# Patient Record
Sex: Male | Born: 1935 | Race: White | Hispanic: No | Smoking: Former smoker
Health system: Southern US, Community
[De-identification: ages and names within clinical notes are randomized; demographics above are authoritative.]

## PROBLEM LIST (undated history)

## (undated) DIAGNOSIS — R918 Other nonspecific abnormal finding of lung field: Secondary | ICD-10-CM

## (undated) DIAGNOSIS — K219 Gastro-esophageal reflux disease without esophagitis: Secondary | ICD-10-CM

## (undated) DIAGNOSIS — C679 Malignant neoplasm of bladder, unspecified: Secondary | ICD-10-CM

## (undated) DIAGNOSIS — E785 Hyperlipidemia, unspecified: Secondary | ICD-10-CM

## (undated) DIAGNOSIS — I480 Paroxysmal atrial fibrillation: Secondary | ICD-10-CM

## (undated) DIAGNOSIS — G479 Sleep disorder, unspecified: Secondary | ICD-10-CM

## (undated) DIAGNOSIS — K2270 Barrett's esophagus without dysplasia: Secondary | ICD-10-CM

## (undated) DIAGNOSIS — I509 Heart failure, unspecified: Secondary | ICD-10-CM

## (undated) DIAGNOSIS — Z85828 Personal history of other malignant neoplasm of skin: Secondary | ICD-10-CM

## (undated) DIAGNOSIS — K635 Polyp of colon: Secondary | ICD-10-CM

## (undated) DIAGNOSIS — R531 Weakness: Secondary | ICD-10-CM

## (undated) DIAGNOSIS — R350 Frequency of micturition: Secondary | ICD-10-CM

## (undated) DIAGNOSIS — H3530 Unspecified macular degeneration: Secondary | ICD-10-CM

## (undated) DIAGNOSIS — C8590 Non-Hodgkin lymphoma, unspecified, unspecified site: Secondary | ICD-10-CM

## (undated) DIAGNOSIS — C169 Malignant neoplasm of stomach, unspecified: Secondary | ICD-10-CM

## (undated) DIAGNOSIS — I639 Cerebral infarction, unspecified: Secondary | ICD-10-CM

## (undated) DIAGNOSIS — K279 Peptic ulcer, site unspecified, unspecified as acute or chronic, without hemorrhage or perforation: Secondary | ICD-10-CM

## (undated) DIAGNOSIS — Z8551 Personal history of malignant neoplasm of bladder: Secondary | ICD-10-CM

## (undated) DIAGNOSIS — Z86010 Personal history of colonic polyps: Secondary | ICD-10-CM

## (undated) DIAGNOSIS — C61 Malignant neoplasm of prostate: Secondary | ICD-10-CM

## (undated) DIAGNOSIS — M1990 Unspecified osteoarthritis, unspecified site: Secondary | ICD-10-CM

## (undated) DIAGNOSIS — K449 Diaphragmatic hernia without obstruction or gangrene: Secondary | ICD-10-CM

## (undated) DIAGNOSIS — K5730 Diverticulosis of large intestine without perforation or abscess without bleeding: Secondary | ICD-10-CM

## (undated) HISTORY — PX: TRIGGER FINGER RELEASE: SHX641

## (undated) HISTORY — PX: TENDON REPAIR: SHX5111

## (undated) HISTORY — DX: Weakness: R53.1

## (undated) HISTORY — PX: CHOLECYSTECTOMY: SHX55

## (undated) HISTORY — DX: Malignant neoplasm of stomach, unspecified: C16.9

## (undated) HISTORY — DX: Malignant neoplasm of bladder, unspecified: C67.9

## (undated) HISTORY — DX: Personal history of colonic polyps: Z86.010

## (undated) HISTORY — DX: Hyperlipidemia, unspecified: E78.5

## (undated) HISTORY — DX: Other nonspecific abnormal finding of lung field: R91.8

## (undated) HISTORY — DX: Gastro-esophageal reflux disease without esophagitis: K21.9

## (undated) HISTORY — PX: WRIST SURGERY: SHX841

## (undated) HISTORY — DX: Polyp of colon: K63.5

## (undated) HISTORY — DX: Barrett's esophagus without dysplasia: K22.70

## (undated) HISTORY — DX: Peptic ulcer, site unspecified, unspecified as acute or chronic, without hemorrhage or perforation: K27.9

## (undated) HISTORY — DX: Malignant neoplasm of prostate: C61

## (undated) HISTORY — DX: Diaphragmatic hernia without obstruction or gangrene: K44.9

## (undated) HISTORY — PX: APPENDECTOMY: SHX54

## (undated) HISTORY — PX: OTHER SURGICAL HISTORY: SHX169

## (undated) HISTORY — PX: LASER OF PROSTATE W/ GREEN LIGHT PVP: SHX1953

## (undated) HISTORY — DX: Cerebral infarction, unspecified: I63.9

## (undated) HISTORY — DX: Diverticulosis of large intestine without perforation or abscess without bleeding: K57.30

---

## 1979-04-05 DIAGNOSIS — Z8551 Personal history of malignant neoplasm of bladder: Secondary | ICD-10-CM

## 1979-04-05 HISTORY — DX: Personal history of malignant neoplasm of bladder: Z85.51

## 1997-10-17 ENCOUNTER — Other Ambulatory Visit: Admission: RE | Admit: 1997-10-17 | Discharge: 1997-10-17 | Payer: Self-pay | Admitting: Gastroenterology

## 1998-01-17 ENCOUNTER — Emergency Department (HOSPITAL_COMMUNITY): Admission: EM | Admit: 1998-01-17 | Discharge: 1998-01-17 | Payer: Self-pay | Admitting: Emergency Medicine

## 1998-03-05 ENCOUNTER — Encounter: Payer: Self-pay | Admitting: Orthopedic Surgery

## 1998-03-12 ENCOUNTER — Ambulatory Visit (HOSPITAL_COMMUNITY): Admission: RE | Admit: 1998-03-12 | Discharge: 1998-03-12 | Payer: Self-pay | Admitting: Orthopedic Surgery

## 1998-06-12 ENCOUNTER — Other Ambulatory Visit: Admission: RE | Admit: 1998-06-12 | Discharge: 1998-06-12 | Payer: Self-pay | Admitting: Gastroenterology

## 1999-07-30 ENCOUNTER — Other Ambulatory Visit: Admission: RE | Admit: 1999-07-30 | Discharge: 1999-07-30 | Payer: Self-pay | Admitting: Gastroenterology

## 1999-07-30 ENCOUNTER — Encounter (INDEPENDENT_AMBULATORY_CARE_PROVIDER_SITE_OTHER): Payer: Self-pay | Admitting: Specialist

## 2000-07-20 ENCOUNTER — Other Ambulatory Visit: Admission: RE | Admit: 2000-07-20 | Discharge: 2000-07-20 | Payer: Self-pay | Admitting: Gastroenterology

## 2000-07-20 ENCOUNTER — Encounter (INDEPENDENT_AMBULATORY_CARE_PROVIDER_SITE_OTHER): Payer: Self-pay | Admitting: Specialist

## 2002-06-06 ENCOUNTER — Emergency Department (HOSPITAL_COMMUNITY): Admission: EM | Admit: 2002-06-06 | Discharge: 2002-06-06 | Payer: Self-pay | Admitting: Emergency Medicine

## 2003-04-07 ENCOUNTER — Encounter: Admission: RE | Admit: 2003-04-07 | Discharge: 2003-04-07 | Payer: Self-pay | Admitting: Orthopedic Surgery

## 2003-12-26 ENCOUNTER — Emergency Department (HOSPITAL_COMMUNITY): Admission: EM | Admit: 2003-12-26 | Discharge: 2003-12-26 | Payer: Self-pay | Admitting: Emergency Medicine

## 2003-12-27 ENCOUNTER — Emergency Department (HOSPITAL_COMMUNITY): Admission: EM | Admit: 2003-12-27 | Discharge: 2003-12-27 | Payer: Self-pay | Admitting: Emergency Medicine

## 2004-04-20 ENCOUNTER — Ambulatory Visit: Payer: Self-pay | Admitting: Gastroenterology

## 2004-05-04 ENCOUNTER — Ambulatory Visit: Payer: Self-pay | Admitting: Gastroenterology

## 2004-06-23 ENCOUNTER — Encounter: Admission: RE | Admit: 2004-06-23 | Discharge: 2004-07-22 | Payer: Self-pay | Admitting: Neurosurgery

## 2004-07-21 ENCOUNTER — Ambulatory Visit: Payer: Self-pay | Admitting: Gastroenterology

## 2004-10-11 ENCOUNTER — Ambulatory Visit: Payer: Self-pay | Admitting: Gastroenterology

## 2004-10-26 ENCOUNTER — Encounter (INDEPENDENT_AMBULATORY_CARE_PROVIDER_SITE_OTHER): Payer: Self-pay | Admitting: Specialist

## 2004-10-26 ENCOUNTER — Ambulatory Visit: Payer: Self-pay | Admitting: Gastroenterology

## 2005-08-03 ENCOUNTER — Encounter: Payer: Self-pay | Admitting: Emergency Medicine

## 2006-03-24 ENCOUNTER — Ambulatory Visit: Payer: Self-pay | Admitting: Gastroenterology

## 2006-04-05 ENCOUNTER — Encounter (INDEPENDENT_AMBULATORY_CARE_PROVIDER_SITE_OTHER): Payer: Self-pay | Admitting: *Deleted

## 2006-04-05 ENCOUNTER — Ambulatory Visit: Payer: Self-pay | Admitting: Gastroenterology

## 2007-02-20 ENCOUNTER — Ambulatory Visit: Payer: Self-pay | Admitting: Gastroenterology

## 2007-05-28 DIAGNOSIS — E785 Hyperlipidemia, unspecified: Secondary | ICD-10-CM

## 2007-05-28 DIAGNOSIS — K449 Diaphragmatic hernia without obstruction or gangrene: Secondary | ICD-10-CM | POA: Insufficient documentation

## 2007-05-28 DIAGNOSIS — C679 Malignant neoplasm of bladder, unspecified: Secondary | ICD-10-CM

## 2007-05-28 DIAGNOSIS — Z8719 Personal history of other diseases of the digestive system: Secondary | ICD-10-CM

## 2007-05-29 ENCOUNTER — Encounter (INDEPENDENT_AMBULATORY_CARE_PROVIDER_SITE_OTHER): Payer: Self-pay | Admitting: Gastroenterology

## 2008-03-10 ENCOUNTER — Telehealth: Payer: Self-pay | Admitting: Gastroenterology

## 2008-03-11 ENCOUNTER — Ambulatory Visit: Payer: Self-pay | Admitting: Gastroenterology

## 2008-04-09 ENCOUNTER — Ambulatory Visit: Payer: Self-pay | Admitting: Gastroenterology

## 2008-04-16 ENCOUNTER — Ambulatory Visit: Payer: Self-pay | Admitting: Gastroenterology

## 2008-04-16 ENCOUNTER — Encounter: Payer: Self-pay | Admitting: Gastroenterology

## 2008-04-16 LAB — CONVERTED CEMR LAB: UREASE: NEGATIVE

## 2008-04-17 ENCOUNTER — Encounter: Payer: Self-pay | Admitting: Gastroenterology

## 2008-06-19 ENCOUNTER — Ambulatory Visit (HOSPITAL_BASED_OUTPATIENT_CLINIC_OR_DEPARTMENT_OTHER): Admission: RE | Admit: 2008-06-19 | Discharge: 2008-06-19 | Payer: Self-pay | Admitting: Orthopedic Surgery

## 2008-11-27 ENCOUNTER — Ambulatory Visit (HOSPITAL_COMMUNITY): Admission: RE | Admit: 2008-11-27 | Discharge: 2008-11-27 | Payer: Self-pay | Admitting: Orthopedic Surgery

## 2009-03-26 ENCOUNTER — Ambulatory Visit (HOSPITAL_COMMUNITY): Admission: RE | Admit: 2009-03-26 | Discharge: 2009-03-26 | Payer: Self-pay | Admitting: Orthopedic Surgery

## 2009-04-04 DIAGNOSIS — C8590 Non-Hodgkin lymphoma, unspecified, unspecified site: Secondary | ICD-10-CM

## 2009-04-04 HISTORY — DX: Non-Hodgkin lymphoma, unspecified, unspecified site: C85.90

## 2009-04-30 ENCOUNTER — Ambulatory Visit: Payer: Self-pay | Admitting: Gastroenterology

## 2009-05-13 ENCOUNTER — Encounter: Admission: RE | Admit: 2009-05-13 | Discharge: 2009-06-10 | Payer: Self-pay | Admitting: Neurosurgery

## 2009-12-09 ENCOUNTER — Encounter (INDEPENDENT_AMBULATORY_CARE_PROVIDER_SITE_OTHER): Payer: Self-pay | Admitting: *Deleted

## 2009-12-15 ENCOUNTER — Encounter: Payer: Self-pay | Admitting: Gastroenterology

## 2009-12-16 ENCOUNTER — Telehealth: Payer: Self-pay | Admitting: Gastroenterology

## 2009-12-17 ENCOUNTER — Encounter: Payer: Self-pay | Admitting: Gastroenterology

## 2010-01-04 ENCOUNTER — Encounter (INDEPENDENT_AMBULATORY_CARE_PROVIDER_SITE_OTHER): Payer: Self-pay | Admitting: Internal Medicine

## 2010-01-04 ENCOUNTER — Encounter: Payer: Self-pay | Admitting: Oncology

## 2010-01-04 ENCOUNTER — Inpatient Hospital Stay (HOSPITAL_COMMUNITY): Admission: EM | Admit: 2010-01-04 | Discharge: 2010-01-12 | Payer: Self-pay | Admitting: Emergency Medicine

## 2010-01-05 ENCOUNTER — Encounter: Payer: Self-pay | Admitting: Gastroenterology

## 2010-01-05 ENCOUNTER — Ambulatory Visit: Payer: Self-pay | Admitting: Oncology

## 2010-01-06 ENCOUNTER — Encounter: Payer: Self-pay | Admitting: Oncology

## 2010-01-07 ENCOUNTER — Encounter: Payer: Self-pay | Admitting: Oncology

## 2010-01-09 ENCOUNTER — Ambulatory Visit: Payer: Self-pay | Admitting: Hematology & Oncology

## 2010-01-11 ENCOUNTER — Ambulatory Visit: Payer: Self-pay | Admitting: Oncology

## 2010-01-19 ENCOUNTER — Inpatient Hospital Stay (HOSPITAL_COMMUNITY): Admission: EM | Admit: 2010-01-19 | Discharge: 2010-01-22 | Payer: Self-pay | Admitting: Emergency Medicine

## 2010-01-25 ENCOUNTER — Encounter: Payer: Self-pay | Admitting: Gastroenterology

## 2010-01-25 LAB — CBC WITH DIFFERENTIAL/PLATELET
BASO%: 0.2 % (ref 0.0–2.0)
EOS%: 0 % (ref 0.0–7.0)
HCT: 31.4 % — ABNORMAL LOW (ref 38.4–49.9)
MCH: 30.9 pg (ref 27.2–33.4)
MCHC: 33.7 g/dL (ref 32.0–36.0)
MONO#: 0.3 10*3/uL (ref 0.1–0.9)
RBC: 3.43 10*6/uL — ABNORMAL LOW (ref 4.20–5.82)
RDW: 12.8 % (ref 11.0–14.6)
WBC: 9.2 10*3/uL (ref 4.0–10.3)
lymph#: 0.8 10*3/uL — ABNORMAL LOW (ref 0.9–3.3)

## 2010-01-25 LAB — COMPREHENSIVE METABOLIC PANEL
Albumin: 3.1 g/dL — ABNORMAL LOW (ref 3.5–5.2)
Alkaline Phosphatase: 25 U/L — ABNORMAL LOW (ref 39–117)
BUN: 15 mg/dL (ref 6–23)
CO2: 27 mEq/L (ref 19–32)
Chloride: 106 mEq/L (ref 96–112)
Potassium: 4 mEq/L (ref 3.5–5.3)
Sodium: 139 mEq/L (ref 135–145)
Total Bilirubin: 0.4 mg/dL (ref 0.3–1.2)

## 2010-01-25 LAB — URIC ACID: Uric Acid, Serum: 4.3 mg/dL (ref 4.0–7.8)

## 2010-02-10 ENCOUNTER — Ambulatory Visit: Payer: Self-pay | Admitting: Oncology

## 2010-02-12 ENCOUNTER — Encounter: Payer: Self-pay | Admitting: Gastroenterology

## 2010-02-12 LAB — CBC WITH DIFFERENTIAL/PLATELET
EOS%: 0.1 % (ref 0.0–7.0)
LYMPH%: 8.5 % — ABNORMAL LOW (ref 14.0–49.0)
MCH: 31.1 pg (ref 27.2–33.4)
MCHC: 33.2 g/dL (ref 32.0–36.0)
MCV: 93.5 fL (ref 79.3–98.0)
MONO%: 4 % (ref 0.0–14.0)
NEUT#: 9.2 10*3/uL — ABNORMAL HIGH (ref 1.5–6.5)
Platelets: 251 10*3/uL (ref 140–400)
RBC: 3.62 10*6/uL — ABNORMAL LOW (ref 4.20–5.82)
RDW: 16.1 % — ABNORMAL HIGH (ref 11.0–14.6)

## 2010-02-12 LAB — COMPREHENSIVE METABOLIC PANEL
AST: 15 U/L (ref 0–37)
Albumin: 3.9 g/dL (ref 3.5–5.2)
Alkaline Phosphatase: 32 U/L — ABNORMAL LOW (ref 39–117)
Glucose, Bld: 101 mg/dL — ABNORMAL HIGH (ref 70–99)
Potassium: 4.5 mEq/L (ref 3.5–5.3)
Sodium: 142 mEq/L (ref 135–145)
Total Bilirubin: 0.3 mg/dL (ref 0.3–1.2)
Total Protein: 6.4 g/dL (ref 6.0–8.3)

## 2010-02-12 LAB — URIC ACID: Uric Acid, Serum: 4.6 mg/dL (ref 4.0–7.8)

## 2010-02-19 LAB — CBC WITH DIFFERENTIAL/PLATELET
Basophils Absolute: 0.1 10*3/uL (ref 0.0–0.1)
HCT: 34.1 % — ABNORMAL LOW (ref 38.4–49.9)
HGB: 11.2 g/dL — ABNORMAL LOW (ref 13.0–17.1)
LYMPH%: 10.2 % — ABNORMAL LOW (ref 14.0–49.0)
MCH: 30.8 pg (ref 27.2–33.4)
MCHC: 32.8 g/dL (ref 32.0–36.0)
MONO#: 1.4 10*3/uL — ABNORMAL HIGH (ref 0.1–0.9)
NEUT%: 70.8 % (ref 39.0–75.0)
Platelets: 285 10*3/uL (ref 140–400)
WBC: 8.1 10*3/uL (ref 4.0–10.3)
lymph#: 0.8 10*3/uL — ABNORMAL LOW (ref 0.9–3.3)

## 2010-03-05 ENCOUNTER — Encounter: Payer: Self-pay | Admitting: Gastroenterology

## 2010-03-05 LAB — COMPREHENSIVE METABOLIC PANEL
ALT: 10 U/L (ref 0–53)
Alkaline Phosphatase: 32 U/L — ABNORMAL LOW (ref 39–117)
Glucose, Bld: 106 mg/dL — ABNORMAL HIGH (ref 70–99)
Sodium: 139 mEq/L (ref 135–145)
Total Bilirubin: 0.4 mg/dL (ref 0.3–1.2)
Total Protein: 6.2 g/dL (ref 6.0–8.3)

## 2010-03-05 LAB — CBC WITH DIFFERENTIAL/PLATELET
BASO%: 0.3 % (ref 0.0–2.0)
EOS%: 0.7 % (ref 0.0–7.0)
LYMPH%: 8.6 % — ABNORMAL LOW (ref 14.0–49.0)
MCH: 32.5 pg (ref 27.2–33.4)
MCHC: 34.6 g/dL (ref 32.0–36.0)
MCV: 93.9 fL (ref 79.3–98.0)
MONO%: 7.7 % (ref 0.0–14.0)
Platelets: 200 10*3/uL (ref 140–400)
RBC: 3.4 10*6/uL — ABNORMAL LOW (ref 4.20–5.82)

## 2010-03-09 ENCOUNTER — Ambulatory Visit (HOSPITAL_COMMUNITY)
Admission: RE | Admit: 2010-03-09 | Discharge: 2010-03-09 | Payer: Self-pay | Source: Home / Self Care | Attending: Oncology | Admitting: Oncology

## 2010-03-12 ENCOUNTER — Ambulatory Visit: Payer: Self-pay | Admitting: Oncology

## 2010-03-12 LAB — CBC WITH DIFFERENTIAL/PLATELET
BASO%: 0.7 % (ref 0.0–2.0)
HCT: 31.6 % — ABNORMAL LOW (ref 38.4–49.9)
LYMPH%: 9.9 % — ABNORMAL LOW (ref 14.0–49.0)
MCHC: 34.3 g/dL (ref 32.0–36.0)
MONO#: 1 10*3/uL — ABNORMAL HIGH (ref 0.1–0.9)
NEUT%: 71.8 % (ref 39.0–75.0)
Platelets: 261 10*3/uL (ref 140–400)
WBC: 6.5 10*3/uL (ref 4.0–10.3)

## 2010-03-12 LAB — COMPREHENSIVE METABOLIC PANEL
ALT: 9 U/L (ref 0–53)
BUN: 17 mg/dL (ref 6–23)
CO2: 27 mEq/L (ref 19–32)
Creatinine, Ser: 0.98 mg/dL (ref 0.40–1.50)
Glucose, Bld: 94 mg/dL (ref 70–99)
Total Bilirubin: 0.3 mg/dL (ref 0.3–1.2)

## 2010-03-12 LAB — LACTATE DEHYDROGENASE: LDH: 151 U/L (ref 94–250)

## 2010-03-26 ENCOUNTER — Encounter: Payer: Self-pay | Admitting: Gastroenterology

## 2010-03-26 ENCOUNTER — Ambulatory Visit (HOSPITAL_COMMUNITY)
Admission: RE | Admit: 2010-03-26 | Discharge: 2010-03-26 | Payer: Self-pay | Source: Home / Self Care | Attending: Oncology | Admitting: Oncology

## 2010-03-26 LAB — COMPREHENSIVE METABOLIC PANEL
ALT: 10 U/L (ref 0–53)
Albumin: 3.5 g/dL (ref 3.5–5.2)
BUN: 21 mg/dL (ref 6–23)
CO2: 26 mEq/L (ref 19–32)
Calcium: 8.6 mg/dL (ref 8.4–10.5)
Chloride: 106 mEq/L (ref 96–112)
Creatinine, Ser: 0.94 mg/dL (ref 0.40–1.50)
Potassium: 4.8 mEq/L (ref 3.5–5.3)

## 2010-03-26 LAB — CBC WITH DIFFERENTIAL/PLATELET
Basophils Absolute: 0 10*3/uL (ref 0.0–0.1)
HCT: 29.2 % — ABNORMAL LOW (ref 38.4–49.9)
HGB: 10.1 g/dL — ABNORMAL LOW (ref 13.0–17.1)
MONO#: 0.5 10*3/uL (ref 0.1–0.9)
NEUT#: 6.3 10*3/uL (ref 1.5–6.5)
NEUT%: 87.2 % — ABNORMAL HIGH (ref 39.0–75.0)
WBC: 7.2 10*3/uL (ref 4.0–10.3)
lymph#: 0.4 10*3/uL — ABNORMAL LOW (ref 0.9–3.3)

## 2010-03-26 LAB — LACTATE DEHYDROGENASE: LDH: 175 U/L (ref 94–250)

## 2010-04-01 ENCOUNTER — Encounter: Payer: Self-pay | Admitting: Gastroenterology

## 2010-04-01 LAB — COMPREHENSIVE METABOLIC PANEL
ALT: 19 U/L (ref 0–53)
AST: 28 U/L (ref 0–37)
Albumin: 3.2 g/dL — ABNORMAL LOW (ref 3.5–5.2)
Alkaline Phosphatase: 36 U/L — ABNORMAL LOW (ref 39–117)
BUN: 24 mg/dL — ABNORMAL HIGH (ref 6–23)
Calcium: 9.2 mg/dL (ref 8.4–10.5)
Chloride: 106 mEq/L (ref 96–112)
Potassium: 5 mEq/L (ref 3.5–5.3)
Sodium: 138 mEq/L (ref 135–145)
Total Protein: 6.4 g/dL (ref 6.0–8.3)

## 2010-04-01 LAB — CBC WITH DIFFERENTIAL/PLATELET
Basophils Absolute: 0.1 10*3/uL (ref 0.0–0.1)
EOS%: 1.1 % (ref 0.0–7.0)
HGB: 10.5 g/dL — ABNORMAL LOW (ref 13.0–17.1)
MCH: 32.7 pg (ref 27.2–33.4)
NEUT#: 5.9 10*3/uL (ref 1.5–6.5)
RBC: 3.21 10*6/uL — ABNORMAL LOW (ref 4.20–5.82)
RDW: 17.3 % — ABNORMAL HIGH (ref 11.0–14.6)
lymph#: 0.9 10*3/uL (ref 0.9–3.3)

## 2010-04-02 ENCOUNTER — Telehealth: Payer: Self-pay | Admitting: Gastroenterology

## 2010-04-10 ENCOUNTER — Emergency Department (HOSPITAL_COMMUNITY)
Admission: EM | Admit: 2010-04-10 | Discharge: 2010-04-10 | Payer: Self-pay | Source: Home / Self Care | Admitting: Emergency Medicine

## 2010-04-15 ENCOUNTER — Ambulatory Visit (HOSPITAL_BASED_OUTPATIENT_CLINIC_OR_DEPARTMENT_OTHER): Payer: BLUE CROSS/BLUE SHIELD | Admitting: Oncology

## 2010-04-16 ENCOUNTER — Encounter: Payer: Self-pay | Admitting: Gastroenterology

## 2010-04-16 LAB — CBC WITH DIFFERENTIAL/PLATELET
BASO%: 0.3 % (ref 0.0–2.0)
Basophils Absolute: 0 10*3/uL (ref 0.0–0.1)
EOS%: 0.5 % (ref 0.0–7.0)
Eosinophils Absolute: 0 10*3/uL (ref 0.0–0.5)
HCT: 29.8 % — ABNORMAL LOW (ref 38.4–49.9)
HGB: 10.2 g/dL — ABNORMAL LOW (ref 13.0–17.1)
LYMPH%: 7 % — ABNORMAL LOW (ref 14.0–49.0)
MCH: 32.6 pg (ref 27.2–33.4)
MCHC: 34.2 g/dL (ref 32.0–36.0)
MCV: 95.2 fL (ref 79.3–98.0)
MONO#: 0.4 10*3/uL (ref 0.1–0.9)
MONO%: 5.4 % (ref 0.0–14.0)
NEUT#: 7.2 10*3/uL — ABNORMAL HIGH (ref 1.5–6.5)
NEUT%: 86.8 % — ABNORMAL HIGH (ref 39.0–75.0)
Platelets: 186 10*3/uL (ref 140–400)
RBC: 3.13 10*6/uL — ABNORMAL LOW (ref 4.20–5.82)
RDW: 16.8 % — ABNORMAL HIGH (ref 11.0–14.6)
WBC: 8.3 10*3/uL (ref 4.0–10.3)
lymph#: 0.6 10*3/uL — ABNORMAL LOW (ref 0.9–3.3)

## 2010-04-16 LAB — COMPREHENSIVE METABOLIC PANEL
ALT: 12 U/L (ref 0–53)
AST: 19 U/L (ref 0–37)
Albumin: 3.8 g/dL (ref 3.5–5.2)
Alkaline Phosphatase: 35 U/L — ABNORMAL LOW (ref 39–117)
BUN: 22 mg/dL (ref 6–23)
CO2: 24 mEq/L (ref 19–32)
Calcium: 9 mg/dL (ref 8.4–10.5)
Chloride: 104 mEq/L (ref 96–112)
Creatinine, Ser: 0.97 mg/dL (ref 0.40–1.50)
Glucose, Bld: 92 mg/dL (ref 70–99)
Potassium: 4.5 mEq/L (ref 3.5–5.3)
Sodium: 137 mEq/L (ref 135–145)
Total Bilirubin: 0.4 mg/dL (ref 0.3–1.2)
Total Protein: 6.2 g/dL (ref 6.0–8.3)

## 2010-04-16 LAB — LACTATE DEHYDROGENASE: LDH: 170 U/L (ref 94–250)

## 2010-04-19 LAB — POCT I-STAT, CHEM 8
BUN: 24 mg/dL — ABNORMAL HIGH (ref 6–23)
Calcium, Ion: 1.09 mmol/L — ABNORMAL LOW (ref 1.12–1.32)
Chloride: 105 mEq/L (ref 96–112)
Creatinine, Ser: 1 mg/dL (ref 0.4–1.5)
Glucose, Bld: 98 mg/dL (ref 70–99)
HCT: 29 % — ABNORMAL LOW (ref 39.0–52.0)
Hemoglobin: 9.9 g/dL — ABNORMAL LOW (ref 13.0–17.0)
Potassium: 4.2 mEq/L (ref 3.5–5.1)
Sodium: 137 mEq/L (ref 135–145)
TCO2: 24 mmol/L (ref 0–100)

## 2010-04-22 ENCOUNTER — Other Ambulatory Visit: Payer: Self-pay | Admitting: Oncology

## 2010-04-22 DIAGNOSIS — C8590 Non-Hodgkin lymphoma, unspecified, unspecified site: Secondary | ICD-10-CM

## 2010-04-23 ENCOUNTER — Other Ambulatory Visit: Payer: Self-pay | Admitting: Oncology

## 2010-04-23 DIAGNOSIS — C8590 Non-Hodgkin lymphoma, unspecified, unspecified site: Secondary | ICD-10-CM

## 2010-04-23 LAB — CBC WITH DIFFERENTIAL/PLATELET
BASO%: 1.7 % (ref 0.0–2.0)
Basophils Absolute: 0.1 10*3/uL (ref 0.0–0.1)
EOS%: 1.4 % (ref 0.0–7.0)
Eosinophils Absolute: 0.1 10*3/uL (ref 0.0–0.5)
HCT: 32.4 % — ABNORMAL LOW (ref 38.4–49.9)
HGB: 10.6 g/dL — ABNORMAL LOW (ref 13.0–17.1)
LYMPH%: 17.2 % (ref 14.0–49.0)
MCH: 31.3 pg (ref 27.2–33.4)
MCHC: 32.7 g/dL (ref 32.0–36.0)
MCV: 95.6 fL (ref 79.3–98.0)
MONO#: 0.9 10*3/uL (ref 0.1–0.9)
MONO%: 10.7 % (ref 0.0–14.0)
NEUT#: 5.8 10*3/uL (ref 1.5–6.5)
NEUT%: 69 % (ref 39.0–75.0)
Platelets: 272 10*3/uL (ref 140–400)
RBC: 3.39 10*6/uL — ABNORMAL LOW (ref 4.20–5.82)
RDW: 15 % — ABNORMAL HIGH (ref 11.0–14.6)
WBC: 8.4 10*3/uL (ref 4.0–10.3)
lymph#: 1.4 10*3/uL (ref 0.9–3.3)

## 2010-04-23 LAB — COMPREHENSIVE METABOLIC PANEL
ALT: 10 U/L (ref 0–53)
AST: 17 U/L (ref 0–37)
Albumin: 3.8 g/dL (ref 3.5–5.2)
Alkaline Phosphatase: 33 U/L — ABNORMAL LOW (ref 39–117)
BUN: 22 mg/dL (ref 6–23)
CO2: 23 mEq/L (ref 19–32)
Calcium: 8.8 mg/dL (ref 8.4–10.5)
Chloride: 104 mEq/L (ref 96–112)
Creatinine, Ser: 0.86 mg/dL (ref 0.40–1.50)
Glucose, Bld: 94 mg/dL (ref 70–99)
Potassium: 4.6 mEq/L (ref 3.5–5.3)
Sodium: 140 mEq/L (ref 135–145)
Total Bilirubin: 0.2 mg/dL — ABNORMAL LOW (ref 0.3–1.2)
Total Protein: 5.9 g/dL — ABNORMAL LOW (ref 6.0–8.3)

## 2010-04-23 LAB — LACTATE DEHYDROGENASE: LDH: 157 U/L (ref 94–250)

## 2010-05-04 NOTE — Letter (Signed)
Summary: [facility]  [facility]   Imported By: Lester Ivins 03/03/2010 09:04:31  _____________________________________________________________________  External Attachment:    Type:   Image     Comment:   External Document

## 2010-05-04 NOTE — Letter (Signed)
Summary: Pre Visit Letter Revised  [facility]  [address] [place], [place] [address]   Phone: [phone]  Fax: [phone]        12/15/2009 MRN: [id number] [facility] [address] [place], [place]  [address]             Procedure Date:  Nov 7 at [facility] to the Gastroenterology Division at [facility].    You are scheduled to see a nurse for your pre-procedure visit on Jan 21, 2010 at 10am on the 3rd floor at [facility], [address]  We ask that you try to arrive at our office 15 minutes prior to your appointment time to allow for check-in.  Please take a minute to review the attached form.  If you answer "Yes" to one or more of the questions on the first page, we ask that you call the person listed at your earliest opportunity.  If you answer "No" to all of the questions, please complete the rest of the form and bring it to your appointment.    Your nurse visit will consist of discussing your medical and surgical history, your immediate family medical history, and your medications.   If you are unable to list all of your medications on the form, please bring the medication bottles to your appointment and we will list them.  We will need to be aware of both prescribed and over the counter drugs.  We will need to know exact dosage information as well.    Please be prepared to read and sign documents such as consent forms, a financial agreement, and acknowledgement forms.  If necessary, and with your consent, a friend or relative is welcome to sit-in on the nurse visit with you.  Please bring your insurance card so that we may make a copy of it.  If your insurance requires a referral to see a specialist, please bring your referral form from your primary care physician.  No co-pay is required for this nurse visit.     If you cannot keep your appointment, please call [phone] to cancel or reschedule prior to your appointment date.   This allows [place] the opportunity to schedule an appointment for another patient in need of care.    Thank you for choosing [facility] for your medical needs.  We appreciate the opportunity to care for you.  Please visit [place] at our website  to learn more about our practice.  Sincerely, The Gastroenterology Division

## 2010-05-04 NOTE — Letter (Signed)
Summary: [facility]  [facility]   Imported By: Sherian Rein 03/12/2010 07:23:52  _____________________________________________________________________  External Attachment:    Type:   Image     Comment:   External Document

## 2010-05-04 NOTE — Letter (Signed)
Summary: Endo/Colon Letter  [facility]  [address] [place], [place] [address]   Phone: [phone]  Fax: [phone]      December 09, 2009 MRN: [id number]   [facility] [address] [place], [place]  [address]   Dear Jason [name],   According to your medical record, it is time for you to schedule an Endoscopy/Colonoscopy . Endoscopic screeening is recommended for patients with certain upper digestive tract conditions because of associated increased risk for cancers of the upper digestive system. The American Cancer Society recommends Colonoscopy as a method to detect early colon cancer. Patients with a family history of colon cancer, or a personal history of colon polyps or inflammatory bowel disease are at increased risk.  This letter has been generated based on the recommendations made at the time of your prior procedure. If you feel that in your particular situation this may no longer apply, please contact our office.  Please call our office at [phone] to schedule this appointment or to update your records at your earliest convenience.  Thank you for cooperating with [place] to provide you with the very best care possible.   Sincerely,   Vania Rea. Jarold Motto, M.D.  [facility] [phone]

## 2010-05-04 NOTE — Progress Notes (Signed)
Summary: Triage  Phone Note Call from Patient Call back at Home Phone [phone]   Caller: Patient Call For: Dr. Jarold Motto Reason for Call: Talk to Nurse Summary of Call: Has a COL/EGD sch'd for 02-08-10 and having abd pain and discomfort. Been taking Nexium and it has relieved it some Initial call taken by: Karna Christmas,  December 16, 2009 4:07 PM  Follow-up for Phone Call         [place] and had reflux increase so doubled Nexium and had episode again yesterday so he tookan extra one and it helped.. Do you recommend staying on b.i.d? Follow-up by: Teryl Lucy RN,  December 17, 2009 8:33 AM  Additional Follow-up for Phone Call Additional follow up Details #1::        yes and p.r.n. liquid Carafate every 4-6 hours. Additional Follow-up by: Mardella Layman MD [facility],  December 17, 2009 8:39 AM     Appended Document: Triage Pt. ntfd. of Dr.Patterson's orders.

## 2010-05-04 NOTE — Miscellaneous (Signed)
Summary: Med. order  Clinical Lists Changes  Medications: Added new medication of CARAFATE 1 GM/10ML SUSP (SUCRALFATE) Use 10 ml every 4-6 hrs. as needed for abd. pain - Signed Rx of CARAFATE 1 GM/10ML SUSP (SUCRALFATE) Use 10 ml every 4-6 hrs. as needed for abd. pain;  #14 oz. x 1;  Signed;  Entered by: Teryl Lucy RN;  Authorized by: Mardella Layman MD [facility];  Method [facility]: [facility]  [phone]*, [address], [place], [place]  [address], Ph: [phone] or [phone], Fax: [phone]    Prescriptions: CARAFATE 1 GM/10ML SUSP (SUCRALFATE) Use 10 ml every 4-6 hrs. as needed for abd. pain  #14 oz. x 1   Entered by:   Teryl Lucy RN   Authorized by:   Mardella Layman MD [facility]   Signed by:   Teryl Lucy RN on 12/17/2009   Method used:   [facility] to        [facility]  [phone]* (retail)       [address] [place], [place]  [address]       Ph: [phone] or [phone]       Fax: [phone]   RxID:   [id number]

## 2010-05-04 NOTE — Assessment & Plan Note (Signed)
Summary: YEARLY F-UP & MED REFILLS/YF   History of Present Illness Visit Type: Follow-up Visit Primary GI MD: Sheryn Bison MD FACP FAGA Primary Provider: Gildardo Cranker, MD Chief Complaint: Nexium refills. Denies any GI sx.  History of Present Illness:   This patient is a 77 year old Caucasian male with chronic acid reflux and Barrett's mucosa. He is on daily Nexium and is currently asymptomatic. He is in excellent health without chronic medical problems except for hyperlipidemia and multiple urologic problems managed by Dr. Retta Diones. Last endoscopy was a year ago and showed no dysplasia. His chronic constipation diverticulosis and takes regular MiraLax at bedtime. Review of systems otherwise noncontributory today. He follows a regular diet has had no anorexia or weight loss. He is status post cholecystectomy and denies hepatobiliary complaints.   GI Review of Systems      Denies abdominal pain, acid reflux, belching, bloating, chest pain, dysphagia with liquids, dysphagia with solids, heartburn, loss of appetite, nausea, vomiting, vomiting blood, weight loss, and  weight gain.        Denies anal fissure, black tarry stools, change in bowel habit, constipation, diarrhea, diverticulosis, fecal incontinence, heme positive stool, hemorrhoids, irritable bowel syndrome, jaundice, light color stool, liver problems, rectal bleeding, and  rectal pain.    Current Medications (verified): 1)  Nexium 40 Mg Cpdr (Esomeprazole Magnesium) .... Take One By Mouth Once Daily 2)  Vytorin 10-40 Mg Tabs (Ezetimibe-Simvastatin) .... Take One By Mouth Once Daily 3)  Adult Aspirin Ec Low Strength 81 Mg Tbec (Aspirin) .... Take One By Mouth Once Daily 4)  Fish Oil 1000 Mg Caps (Omega-3 Fatty Acids) .... Take One By Mouth Three Times A Day 5)  Polyethylene Glycol 3350  Powd (Polyethylene Glycol 3350) .... Take One Scoop At Bedtime 6)  Icaps  Caps (Multiple Vitamins-Minerals) .... One Tablet By Mouth Two Times A  Day 7)  Fish Oil 1000 Mg Caps (Omega-3 Fatty Acids) .... One Tablet By Mouth Three Times A Day  Allergies (verified): No Known Drug Allergies  Past History:  Past medical, surgical, family and social histories (including risk factors) reviewed for relevance to current acute and chronic problems.  Past Medical History: Reviewed history from 03/10/2008 and no changes required. Current Problems:  BLADDER CANCER (ICD-188.9) NEOPLASM, MALIGNANT, PROSTATE, HX OF, S/P TURP (ICD-V10.46) CHOLECYSTECTOMY, LAPAROSCOPIC, HX OF (ICD-V45.79) PUD (ICD-533.90) HYPERLIPIDEMIA (ICD-272.4) HIATAL HERNIA (ICD-553.3) GERD (ICD-530.81) GASTRITIS, CHRONIC (ICD-535.10) DIVERTICULOSIS OF COLON (ICD-562.10) POLYP, COLON (ICD-211.3) BARRETT'S ESOPHAGUS, HX OF (ICD-V12.79)  Past Surgical History: cholecceptectomy,lt shoulder piece of bone removed,malignant tumor excise from bladder 81,turp 82, 83, 92. Right and Left wrist surgery Carpal Tunnel Release  Family History: Reviewed history from 03/11/2008 and no changes required. Back Cancer: Brother Bone Cancer: Brother Family History of Prostate Cancer:Father  Social History: Reviewed history from 03/11/2008 and no changes required. Occupation: Retired Patient is a former smoker. 1968 Alcohol Use - no Illicit Drug Use - no Patient does not get regular exercise.   Review of Systems  The patient denies allergy/sinus, anemia, anxiety-new, arthritis/joint pain, back pain, blood in urine, breast changes/lumps, change in vision, confusion, cough, coughing up blood, depression-new, fainting, fatigue, fever, headaches-new, hearing problems, heart murmur, heart rhythm changes, itching, menstrual pain, muscle pains/cramps, night sweats, nosebleeds, pregnancy symptoms, shortness of breath, skin rash, sleeping problems, sore throat, swelling of feet/legs, swollen lymph glands, thirst - excessive , urination - excessive , urination changes/pain, urine leakage,  vision changes, and voice change.    Vital Signs:  Patient profile:  77 year old male Height:      72 inches Weight:      248.38 pounds BMI:     33.81 Pulse rate:   66 / minute Pulse rhythm:   regular BP sitting:   148 / 74  (right arm) Cuff size:   regular  Vitals Entered By: Christie Nottingham CMA Duncan Dull) (April 30, 2009 10:27 AM)  Physical Exam  General:  Well developed, well nourished, no acute distress.healthy appearing.   Head:  Normocephalic and atraumatic. Eyes:  PERRLA, no icterus.exam deferred to patient's ophthalmologist.   Lungs:  Clear throughout to auscultation. Heart:  Regular rate and rhythm; no murmurs, rubs,  or bruits. Abdomen:  Soft, nontender and nondistended. No masses, hepatosplenomegaly or hernias noted. Normal bowel sounds.obese.   Extremities:  No clubbing, cyanosis, edema or deformities noted. Neurologic:  Alert and  oriented x4;  grossly normal neurologically. Cervical Nodes:  No significant cervical adenopathy. Psych:  Alert and cooperative. Normal mood and affect.   Impression & Recommendations:  Problem # 1:  GERD (ICD-530.81) Assessment Improved Continue reflux maneuvers and Nexium 40 mg q.a.m.  Problem # 2:  BARRETT'S ESOPHAGUS, HX OF (ICD-V12.79) Assessment: Improved Endoscopy followup next January as per clinical protocol  Problem # 3:  DIVERTICULOSIS OF COLON (ICD-562.10) Assessment: Improved High-Fiber Diet As Tolerated with liberal p.o. fluids and p.r.n. MiraLax.  Problem # 4:  POLYP, COLON (ICD-211.3) Assessment: Unchanged Review of his chart shows a history of multiple recurrent adenomas and he is due for followup colonoscopy next year at the time of his endoscopy.  Patient Instructions: 1)  Please continue current medications.  2)  Avoid foods high in acid content ( tomatoes, citrus juices, spicy foods) . Avoid eating within 3 to 4 hours of lying down or before exercising. Do not over eat; try smaller more frequent meals.  Elevate head of bed four inches when sleeping.  3)  Diet should be high in fiber ( fruits, vegetables, whole grains) but low in residue. Drink at least eight (8) glasses of water a day.  4)  Copy sent to : Dr. Okey Regal 5)  Endoscopy and colonoscopy followup in one year's time 6)  The medication list was reviewed and reconciled.  All changed / newly prescribed medications were explained.  A complete medication list was provided to the patient / caregiver.  Appended Document: YEARLY F-UP & MED REFILLS/YF    Clinical Lists Changes  Medications: Changed medication from NEXIUM 40 MG CPDR (ESOMEPRAZOLE MAGNESIUM) take one by mouth once daily to NEXIUM 40 MG CPDR (ESOMEPRAZOLE MAGNESIUM) take one by mouth once daily - Signed Rx of NEXIUM 40 MG CPDR (ESOMEPRAZOLE MAGNESIUM) take one by mouth once daily;  #30 x 11;  Signed;  Entered by: Ashok Cordia RN;  Authorized by: Mardella Layman MD [facility];  Method used: [facility] to [facility]  [phone]*, [address], [place], [place]  [address], Ph: [phone] or [phone], Fax: [phone]    Prescriptions: NEXIUM 40 MG CPDR (ESOMEPRAZOLE MAGNESIUM) take one by mouth once daily  #30 x 11   Entered by:   Ashok Cordia RN   Authorized by:   Mardella Layman MD [facility]   Signed by:   Ashok Cordia RN on 04/30/2009   Method used:   Electronically to        [facility]  [phone]* (retail)       [address] [place], [place]  [address]  Ph: [phone] or [phone]       Fax: [phone]   RxID:   [id number]

## 2010-05-04 NOTE — Letter (Signed)
Summary: [facility]  [facility]   Imported By: Sherian Rein 02/11/2010 08:47:20  _____________________________________________________________________  External Attachment:    Type:   Image     Comment:   External Document

## 2010-05-04 NOTE — Letter (Signed)
Summary: Consult/[facility]  Consult/[place]   Imported By: Sherian Rein 01/19/2010 09:07:49  _____________________________________________________________________  External Attachment:    Type:   Image     Comment:   External Document  Appended Document: ECL NOT NEEDED does not need endo/colon now.Marland KitchenMarland Kitchen

## 2010-05-06 NOTE — Progress Notes (Signed)
Summary: Samples  Phone Note Call from Patient Call back at Home Phone [phone]   Caller: Patient Call For: Dr. Jarold Motto Reason for Call: Talk to Nurse Summary of Call: Asking for samples of Nexium Initial call taken by: Karna Christmas,  April 02, 2010 8:21 AM  Follow-up for Phone Call        pt is now taking nexium once a day and I have given him samples he ran out when he was taking it two times a day  Follow-up by: Harlow Mares CMA (AAMA),  April 02, 2010 8:30 AM    New/Updated Medications: NEXIUM 40 MG CPDR (ESOMEPRAZOLE MAGNESIUM) take one by mouth once daily

## 2010-05-06 NOTE — Letter (Signed)
Summary: Lonna Cobb NP/[facility]  Lonna Cobb NP/[facility]   Imported By: Lester Toftrees 04/22/2010 08:44:17  _____________________________________________________________________  External Attachment:    Type:   Image     Comment:   External Document

## 2010-05-06 NOTE — Letter (Signed)
Summary: Lonna Cobb NP/[facility]  Lonna Cobb NP/[facility]   Imported By: Lester Lowry 04/20/2010 08:24:05  _____________________________________________________________________  External Attachment:    Type:   Image     Comment:   External Document

## 2010-05-07 ENCOUNTER — Encounter: Payer: BLUE CROSS/BLUE SHIELD | Admitting: Oncology

## 2010-05-07 DIAGNOSIS — Z5111 Encounter for antineoplastic chemotherapy: Secondary | ICD-10-CM

## 2010-05-07 DIAGNOSIS — C8583 Other specified types of non-Hodgkin lymphoma, intra-abdominal lymph nodes: Secondary | ICD-10-CM

## 2010-05-07 DIAGNOSIS — Z5112 Encounter for antineoplastic immunotherapy: Secondary | ICD-10-CM

## 2010-05-07 LAB — MORPHOLOGY: PLT EST: ADEQUATE

## 2010-05-07 LAB — CBC WITH DIFFERENTIAL/PLATELET
BASO%: 0.8 % (ref 0.0–2.0)
HCT: 33.7 % — ABNORMAL LOW (ref 38.4–49.9)
MCHC: 33.8 g/dL (ref 32.0–36.0)
MONO#: 0.4 10*3/uL (ref 0.1–0.9)
NEUT%: 81.7 % — ABNORMAL HIGH (ref 39.0–75.0)
WBC: 6.5 10*3/uL (ref 4.0–10.3)
lymph#: 0.7 10*3/uL — ABNORMAL LOW (ref 0.9–3.3)

## 2010-05-08 LAB — COMPREHENSIVE METABOLIC PANEL
ALT: 11 U/L (ref 0–53)
AST: 17 U/L (ref 0–37)
Alkaline Phosphatase: 36 U/L — ABNORMAL LOW (ref 39–117)
Potassium: 4.3 mEq/L (ref 3.5–5.3)
Sodium: 140 mEq/L (ref 135–145)
Total Bilirubin: 0.3 mg/dL (ref 0.3–1.2)
Total Protein: 6.2 g/dL (ref 6.0–8.3)

## 2010-05-12 NOTE — Letter (Signed)
Summary: [facility]  [facility]   Imported By: Sherian Rein 05/07/2010 11:06:43  _____________________________________________________________________  External Attachment:    Type:   Image     Comment:   External Document

## 2010-05-18 ENCOUNTER — Encounter (HOSPITAL_COMMUNITY)
Admission: RE | Admit: 2010-05-18 | Discharge: 2010-05-18 | Disposition: A | Discharge status: Home / Self Care | Payer: Medicare Other | Source: Ambulatory Visit | Attending: Oncology | Admitting: Oncology

## 2010-05-18 ENCOUNTER — Encounter (HOSPITAL_COMMUNITY): Payer: Self-pay

## 2010-05-18 ENCOUNTER — Other Ambulatory Visit (HOSPITAL_COMMUNITY): Payer: Self-pay

## 2010-05-18 ENCOUNTER — Other Ambulatory Visit: Payer: Self-pay | Admitting: Oncology

## 2010-05-18 ENCOUNTER — Ambulatory Visit (HOSPITAL_COMMUNITY)
Admission: RE | Admit: 2010-05-18 | Discharge: 2010-05-18 | Disposition: A | Discharge status: Home / Self Care | Payer: Medicare Other | Source: Ambulatory Visit | Attending: Oncology | Admitting: Oncology

## 2010-05-18 DIAGNOSIS — C8588 Other specified types of non-Hodgkin lymphoma, lymph nodes of multiple sites: Secondary | ICD-10-CM | POA: Insufficient documentation

## 2010-05-18 DIAGNOSIS — M47817 Spondylosis without myelopathy or radiculopathy, lumbosacral region: Secondary | ICD-10-CM | POA: Insufficient documentation

## 2010-05-18 DIAGNOSIS — R918 Other nonspecific abnormal finding of lung field: Secondary | ICD-10-CM | POA: Insufficient documentation

## 2010-05-18 DIAGNOSIS — C8589 Other specified types of non-Hodgkin lymphoma, extranodal and solid organ sites: Secondary | ICD-10-CM | POA: Insufficient documentation

## 2010-05-18 DIAGNOSIS — K7689 Other specified diseases of liver: Secondary | ICD-10-CM | POA: Insufficient documentation

## 2010-05-18 DIAGNOSIS — C8590 Non-Hodgkin lymphoma, unspecified, unspecified site: Secondary | ICD-10-CM

## 2010-05-18 MED ORDER — IOHEXOL 300 MG/ML  SOLN
125.0000 mL | Freq: Once | INTRAMUSCULAR | Status: AC | PRN
  Administered 2010-05-18: 125 mL via INTRAVENOUS

## 2010-05-18 MED ORDER — FLUDEOXYGLUCOSE F - 18 (FDG) INJECTION
16.7000 | Freq: Once | INTRAVENOUS | Status: AC | PRN
  Administered 2010-05-18: 16.7 via INTRAVENOUS

## 2010-05-21 ENCOUNTER — Encounter: Payer: Self-pay | Admitting: Gastroenterology

## 2010-05-21 ENCOUNTER — Encounter (HOSPITAL_BASED_OUTPATIENT_CLINIC_OR_DEPARTMENT_OTHER): Payer: BLUE CROSS/BLUE SHIELD | Admitting: Oncology

## 2010-05-21 ENCOUNTER — Other Ambulatory Visit: Payer: Self-pay | Admitting: Oncology

## 2010-05-21 DIAGNOSIS — C8583 Other specified types of non-Hodgkin lymphoma, intra-abdominal lymph nodes: Secondary | ICD-10-CM

## 2010-05-21 DIAGNOSIS — C772 Secondary and unspecified malignant neoplasm of intra-abdominal lymph nodes: Secondary | ICD-10-CM

## 2010-05-24 ENCOUNTER — Other Ambulatory Visit: Payer: Self-pay | Admitting: Oncology

## 2010-05-24 DIAGNOSIS — C8590 Non-Hodgkin lymphoma, unspecified, unspecified site: Secondary | ICD-10-CM

## 2010-06-04 ENCOUNTER — Ambulatory Visit (HOSPITAL_COMMUNITY)
Admission: RE | Admit: 2010-06-04 | Discharge: 2010-06-04 | Disposition: A | Discharge status: Home / Self Care | Payer: Medicare Other | Source: Ambulatory Visit | Attending: Oncology | Admitting: Oncology

## 2010-06-04 ENCOUNTER — Other Ambulatory Visit (HOSPITAL_COMMUNITY): Payer: Medicare Other

## 2010-06-04 DIAGNOSIS — C8589 Other specified types of non-Hodgkin lymphoma, extranodal and solid organ sites: Secondary | ICD-10-CM | POA: Insufficient documentation

## 2010-06-04 DIAGNOSIS — Z452 Encounter for adjustment and management of vascular access device: Secondary | ICD-10-CM | POA: Insufficient documentation

## 2010-06-04 DIAGNOSIS — K5900 Constipation, unspecified: Secondary | ICD-10-CM | POA: Insufficient documentation

## 2010-06-04 DIAGNOSIS — C8590 Non-Hodgkin lymphoma, unspecified, unspecified site: Secondary | ICD-10-CM

## 2010-06-04 LAB — CBC
HCT: 35.1 % — ABNORMAL LOW (ref 39.0–52.0)
Hemoglobin: 11.5 g/dL — ABNORMAL LOW (ref 13.0–17.0)
MCH: 31.1 pg (ref 26.0–34.0)
MCHC: 32.8 g/dL (ref 30.0–36.0)
RDW: 13.3 % (ref 11.5–15.5)

## 2010-06-15 NOTE — Letter (Signed)
Summary: Cephas Darby MD  Cephas Darby MD   Imported By: Lester Fannin 06/10/2010 09:06:04  _____________________________________________________________________  External Attachment:    Type:   Image     Comment:   External Document

## 2010-06-16 LAB — DIFFERENTIAL
Band Neutrophils: 0 % (ref 0–10)
Basophils Absolute: 0 10*3/uL (ref 0.0–0.1)
Eosinophils Absolute: 0 10*3/uL (ref 0.0–0.7)
Eosinophils Absolute: 0 10*3/uL (ref 0.0–0.7)
Lymphs Abs: 0 10*3/uL — ABNORMAL LOW (ref 0.7–4.0)
Monocytes Absolute: 0 10*3/uL — ABNORMAL LOW (ref 0.1–1.0)
Monocytes Absolute: 0 10*3/uL — ABNORMAL LOW (ref 0.1–1.0)
Monocytes Relative: 0 % — ABNORMAL LOW (ref 3–12)
Neutrophils Relative %: 87 % — ABNORMAL HIGH (ref 43–77)
nRBC: 0 /100 WBC

## 2010-06-16 LAB — CBC
HCT: 30.1 % — ABNORMAL LOW (ref 39.0–52.0)
MCH: 30.5 pg (ref 26.0–34.0)
MCH: 30.5 pg (ref 26.0–34.0)
MCH: 30.6 pg (ref 26.0–34.0)
MCHC: 33.6 g/dL (ref 30.0–36.0)
MCHC: 33.9 g/dL (ref 30.0–36.0)
MCHC: 34.1 g/dL (ref 30.0–36.0)
MCV: 88.4 fL (ref 78.0–100.0)
Platelets: 36 10*3/uL — ABNORMAL LOW (ref 150–400)
Platelets: 49 10*3/uL — ABNORMAL LOW (ref 150–400)
Platelets: 89 10*3/uL — ABNORMAL LOW (ref 150–400)
RBC: 3.25 MIL/uL — ABNORMAL LOW (ref 4.22–5.81)
RBC: 3.47 MIL/uL — ABNORMAL LOW (ref 4.22–5.81)
RDW: 12 % (ref 11.5–15.5)
RDW: 12.3 % (ref 11.5–15.5)

## 2010-06-16 LAB — BASIC METABOLIC PANEL
Chloride: 108 mEq/L (ref 96–112)
GFR calc Af Amer: >60 mL/min (ref >60)
Potassium: 3.7 mEq/L (ref 3.5–5.1)

## 2010-06-16 LAB — URINALYSIS, ROUTINE W REFLEX MICROSCOPIC
Glucose, UA: NEGATIVE mg/dL
Hgb urine dipstick: NEGATIVE
Ketones, ur: NEGATIVE mg/dL
pH: 7.5 (ref 5.0–8.0)

## 2010-06-16 LAB — COMPREHENSIVE METABOLIC PANEL
CO2: 23 mEq/L (ref 19–32)
Calcium: 7.8 mg/dL — ABNORMAL LOW (ref 8.4–10.5)
Creatinine, Ser: 0.91 mg/dL (ref 0.4–1.5)
GFR calc Af Amer: >60 mL/min (ref >60)
GFR calc non Af Amer: >60 mL/min (ref >60)
Glucose, Bld: 99 mg/dL (ref 70–99)

## 2010-06-16 LAB — CULTURE, BLOOD (ROUTINE X 2)
Culture  Setup Time: 201110180510
Culture: NO GROWTH

## 2010-06-16 LAB — URINE CULTURE
Colony Count: NO GROWTH
Culture  Setup Time: 201110180808

## 2010-06-17 LAB — COMPREHENSIVE METABOLIC PANEL
ALT: 16 U/L (ref 0–53)
ALT: 28 U/L (ref 0–53)
AST: 127 U/L — ABNORMAL HIGH (ref 0–37)
AST: 38 U/L — ABNORMAL HIGH (ref 0–37)
AST: 46 U/L — ABNORMAL HIGH (ref 0–37)
Albumin: 2.3 g/dL — ABNORMAL LOW (ref 3.5–5.2)
Albumin: 3.3 g/dL — ABNORMAL LOW (ref 3.5–5.2)
Albumin: 3.3 g/dL — ABNORMAL LOW (ref 3.5–5.2)
Alkaline Phosphatase: 20 U/L — ABNORMAL LOW (ref 39–117)
Alkaline Phosphatase: 30 U/L — ABNORMAL LOW (ref 39–117)
BUN: 19 mg/dL (ref 6–23)
CO2: 24 mEq/L (ref 19–32)
Calcium: 8.1 mg/dL — ABNORMAL LOW (ref 8.4–10.5)
Calcium: 8.5 mg/dL (ref 8.4–10.5)
Calcium: 9.2 mg/dL (ref 8.4–10.5)
Chloride: 99 mEq/L (ref 96–112)
Creatinine, Ser: 0.97 mg/dL (ref 0.4–1.5)
Creatinine, Ser: 1.06 mg/dL (ref 0.4–1.5)
GFR calc Af Amer: >60 mL/min (ref >60)
GFR calc Af Amer: >60 mL/min (ref >60)
GFR calc Af Amer: >60 mL/min (ref >60)
GFR calc non Af Amer: >60 mL/min (ref >60)
Glucose, Bld: 133 mg/dL — ABNORMAL HIGH (ref 70–99)
Glucose, Bld: 85 mg/dL (ref 70–99)
Potassium: 4.2 mEq/L (ref 3.5–5.1)
Potassium: 4.4 mEq/L (ref 3.5–5.1)
Sodium: 137 mEq/L (ref 135–145)
Sodium: 138 mEq/L (ref 135–145)
Total Bilirubin: 1 mg/dL (ref 0.3–1.2)
Total Protein: 5.1 g/dL — ABNORMAL LOW (ref 6.0–8.3)
Total Protein: 5.2 g/dL — ABNORMAL LOW (ref 6.0–8.3)
Total Protein: 5.7 g/dL — ABNORMAL LOW (ref 6.0–8.3)
Total Protein: 6.3 g/dL (ref 6.0–8.3)

## 2010-06-17 LAB — HIV-1 RNA QUANT-NO REFLEX-BLD: HIV-1 RNA Quant, Log: <1.3 {Log} (ref <1.30)

## 2010-06-17 LAB — TISSUE HYBRIDIZATION (BONE MARROW)-NCBH

## 2010-06-17 LAB — BODY FLUID CULTURE

## 2010-06-17 LAB — LACTATE DEHYDROGENASE: LDH: 230 U/L (ref 94–250)

## 2010-06-17 LAB — DIFFERENTIAL
Basophils Relative: 0 % (ref 0–1)
Eosinophils Absolute: 0 10*3/uL (ref 0.0–0.7)
Eosinophils Relative: 5 % (ref 0–5)
Lymphocytes Relative: 7 % — ABNORMAL LOW (ref 12–46)
Lymphs Abs: 0.6 10*3/uL — ABNORMAL LOW (ref 0.7–4.0)
Monocytes Absolute: 0.5 10*3/uL (ref 0.1–1.0)
Monocytes Absolute: 0.5 10*3/uL (ref 0.1–1.0)
Monocytes Relative: 6 % (ref 3–12)
Neutro Abs: 7.2 10*3/uL (ref 1.7–7.7)
Neutrophils Relative %: 92 % — ABNORMAL HIGH (ref 43–77)

## 2010-06-17 LAB — URINALYSIS, ROUTINE W REFLEX MICROSCOPIC
Bilirubin Urine: NEGATIVE
Glucose, UA: NEGATIVE mg/dL
Hgb urine dipstick: NEGATIVE
Specific Gravity, Urine: 1.022 (ref 1.005–1.030)
Urobilinogen, UA: 1 mg/dL (ref 0.0–1.0)
pH: 8 (ref 5.0–8.0)

## 2010-06-17 LAB — BODY FLUID CELL COUNT WITH DIFFERENTIAL
Lymphs, Fluid: 85 %
Neutrophil Count, Fluid: 1 % (ref 0–25)
Total Nucleated Cell Count, Fluid: 28565 cu mm — ABNORMAL HIGH (ref 0–1000)

## 2010-06-17 LAB — CBC
HCT: 30.4 % — ABNORMAL LOW (ref 39.0–52.0)
HCT: 34.3 % — ABNORMAL LOW (ref 39.0–52.0)
HCT: 36.2 % — ABNORMAL LOW (ref 39.0–52.0)
Hemoglobin: 10.5 g/dL — ABNORMAL LOW (ref 13.0–17.0)
Hemoglobin: 12.1 g/dL — ABNORMAL LOW (ref 13.0–17.0)
MCH: 30.9 pg (ref 26.0–34.0)
MCH: 31 pg (ref 26.0–34.0)
MCH: 31.4 pg (ref 26.0–34.0)
MCHC: 34.4 g/dL (ref 30.0–36.0)
MCHC: 34.6 g/dL (ref 30.0–36.0)
MCV: 91.4 fL (ref 78.0–100.0)
MCV: 91.7 fL (ref 78.0–100.0)
Platelets: 216 10*3/uL (ref 150–400)
Platelets: 246 10*3/uL (ref 150–400)
Platelets: 253 10*3/uL (ref 150–400)
Platelets: 268 10*3/uL (ref 150–400)
RBC: 3.47 MIL/uL — ABNORMAL LOW (ref 4.22–5.81)
RBC: 3.74 MIL/uL — ABNORMAL LOW (ref 4.22–5.81)
RBC: 3.96 MIL/uL — ABNORMAL LOW (ref 4.22–5.81)
RDW: 12 % (ref 11.5–15.5)
WBC: 7.9 10*3/uL (ref 4.0–10.5)
WBC: 9.3 10*3/uL (ref 4.0–10.5)

## 2010-06-17 LAB — CANCER ANTIGEN 19-9: CA 19-9: 17.4 U/mL — ABNORMAL LOW (ref <35.0)

## 2010-06-17 LAB — BASIC METABOLIC PANEL
CO2: 26 mEq/L (ref 19–32)
Calcium: 8.5 mg/dL (ref 8.4–10.5)
Creatinine, Ser: 1.08 mg/dL (ref 0.4–1.5)
GFR calc Af Amer: >60 mL/min (ref >60)
GFR calc non Af Amer: >60 mL/min (ref >60)

## 2010-06-17 LAB — PROTIME-INR
INR: 1.22 (ref 0.00–1.49)
INR: 1.28 (ref 0.00–1.49)
Prothrombin Time: 16.2 seconds — ABNORMAL HIGH (ref 11.6–15.2)

## 2010-06-17 LAB — PHOSPHORUS
Phosphorus: 4.7 mg/dL — ABNORMAL HIGH (ref 2.3–4.6)
Phosphorus: 4.8 mg/dL — ABNORMAL HIGH (ref 2.3–4.6)

## 2010-06-17 LAB — EBV AB TO VIRAL CAPSID AG PNL, IGG+IGM: EBV VCA IgG: 2.95 {ISR} — ABNORMAL HIGH

## 2010-06-17 LAB — LIPASE, BLOOD: Lipase: 32 U/L (ref 11–59)

## 2010-06-17 LAB — HEPATITIS PANEL, ACUTE: HCV Ab: NEGATIVE

## 2010-06-17 LAB — GLUCOSE, CAPILLARY: Glucose-Capillary: 92 mg/dL (ref 70–99)

## 2010-06-17 LAB — APTT: aPTT: 53 seconds — ABNORMAL HIGH (ref 24–37)

## 2010-06-29 ENCOUNTER — Ambulatory Visit (AMBULATORY_SURGERY_CENTER): Payer: BLUE CROSS/BLUE SHIELD

## 2010-06-29 VITALS — Ht 72.0 in | Wt 244.6 lb

## 2010-06-29 DIAGNOSIS — Z85028 Personal history of other malignant neoplasm of stomach: Secondary | ICD-10-CM

## 2010-06-29 DIAGNOSIS — Z139 Encounter for screening, unspecified: Secondary | ICD-10-CM

## 2010-06-29 MED ORDER — PEG-KCL-NACL-NASULF-NA ASC-C 100 G PO SOLR: 1.0000 | Freq: Once | ORAL | Status: AC

## 2010-07-02 ENCOUNTER — Telehealth: Payer: Self-pay | Admitting: *Deleted

## 2010-07-02 ENCOUNTER — Telehealth: Payer: Self-pay | Admitting: Gastroenterology

## 2010-07-02 NOTE — Telephone Encounter (Signed)
Reviewed new dates and times regarding prep instructions

## 2010-07-02 NOTE — Telephone Encounter (Signed)
Duplicate phone note.

## 2010-07-05 LAB — CBC
MCHC: 34 g/dL (ref 30.0–36.0)
MCV: 90.6 fL (ref 78.0–100.0)
Platelets: 215 10*3/uL (ref 150–400)
WBC: 6.8 10*3/uL (ref 4.0–10.5)

## 2010-07-10 LAB — BASIC METABOLIC PANEL
BUN: 24 mg/dL — ABNORMAL HIGH (ref 6–23)
Calcium: 8.8 mg/dL (ref 8.4–10.5)
Chloride: 110 mEq/L (ref 96–112)
Creatinine, Ser: 1 mg/dL (ref 0.4–1.5)
GFR calc Af Amer: >60 mL/min (ref >60)
GFR calc non Af Amer: >60 mL/min (ref >60)

## 2010-07-10 LAB — HEMOGLOBIN AND HEMATOCRIT, BLOOD
HCT: 39.2 % (ref 39.0–52.0)
Hemoglobin: 13.4 g/dL (ref 13.0–17.0)

## 2010-07-12 ENCOUNTER — Encounter: Payer: BLUE CROSS/BLUE SHIELD | Admitting: Gastroenterology

## 2010-07-14 ENCOUNTER — Ambulatory Visit (AMBULATORY_SURGERY_CENTER): Payer: Medicare Other | Admitting: Gastroenterology

## 2010-07-14 ENCOUNTER — Encounter: Payer: Self-pay | Admitting: Gastroenterology

## 2010-07-14 DIAGNOSIS — K219 Gastro-esophageal reflux disease without esophagitis: Secondary | ICD-10-CM

## 2010-07-14 DIAGNOSIS — K2960 Other gastritis without bleeding: Secondary | ICD-10-CM

## 2010-07-14 DIAGNOSIS — Z1211 Encounter for screening for malignant neoplasm of colon: Secondary | ICD-10-CM

## 2010-07-14 DIAGNOSIS — K2270 Barrett's esophagus without dysplasia: Secondary | ICD-10-CM

## 2010-07-14 DIAGNOSIS — D126 Benign neoplasm of colon, unspecified: Secondary | ICD-10-CM

## 2010-07-14 DIAGNOSIS — C8590 Non-Hodgkin lymphoma, unspecified, unspecified site: Secondary | ICD-10-CM

## 2010-07-14 DIAGNOSIS — K5730 Diverticulosis of large intestine without perforation or abscess without bleeding: Secondary | ICD-10-CM

## 2010-07-14 DIAGNOSIS — Z86010 Personal history of colonic polyps: Secondary | ICD-10-CM

## 2010-07-14 DIAGNOSIS — K2970 Gastritis, unspecified, without bleeding: Secondary | ICD-10-CM

## 2010-07-14 DIAGNOSIS — K2990 Gastroduodenitis, unspecified, without bleeding: Secondary | ICD-10-CM

## 2010-07-14 MED ORDER — SODIUM CHLORIDE 0.9 % IV SOLN: 500.0000 mL | INTRAVENOUS | Status: DC

## 2010-07-14 NOTE — Patient Instructions (Addendum)
[employer] and [employer] reviewed with patient and care partner.  [facility] Discharge Instruction sheet signed by care partner.  Impressions/Recommendations:  Diverticulosis (information sheet given) Polyp (information sheet given) Barrett's Esophagus (information sheet given) Gastritis (information sheet given)  Continue Nexium as directed.  Await pathology to determine further procedures.

## 2010-07-15 ENCOUNTER — Telehealth: Payer: Self-pay

## 2010-07-15 DIAGNOSIS — Z860100 Personal history of colon polyps, unspecified: Secondary | ICD-10-CM

## 2010-07-15 HISTORY — DX: Personal history of colonic polyps: Z86.010

## 2010-07-15 HISTORY — DX: Personal history of colon polyps, unspecified: Z86.0100

## 2010-07-15 NOTE — Telephone Encounter (Signed)

## 2010-07-19 DIAGNOSIS — K219 Gastro-esophageal reflux disease without esophagitis: Secondary | ICD-10-CM

## 2010-07-20 ENCOUNTER — Other Ambulatory Visit: Payer: Self-pay | Admitting: Physician Assistant

## 2010-07-21 ENCOUNTER — Encounter: Payer: Self-pay | Admitting: Gastroenterology

## 2010-08-17 NOTE — Assessment & Plan Note (Signed)
[facility]                         GASTROENTEROLOGY OFFICE NOTE   MADDEN, Jason                       MRN:          [id number]  DATE:02/20/2007                            DOB:          [birth year]    Mr. [name] is an elderly male who has Barrett's mucosa with frequent  endoscopy and followup by Dr. Corinda Gubler. He had endoscopy in 2006 and  January of 2008, both of which showed no evidence of dysplasia. He is  completely asymptomatic on daily Nexium. He denies lower GI or other  medical problems at this time. He does have chronic constipation for  which he takes MiraLax at bedtime and a daily aspirin tablet.   He is a healthy-appearing white male in no distress appearing his stated  age. He weighs 238 pounds and blood pressure is 118/70. Pulse 64 and  regular.  I could not appreciate stigmata of chronic liver disease.  CHEST: Was clear.  He is in a regular rhythm without significant murmurs, gallops or rubs.  I could not appreciate hepatosplenomegaly, abdominal masses or  tenderness. Bowel sounds were normal.   ASSESSMENT:  1. Chronic gastroesophageal reflux disease with known Barrett's mucosa      without evidence of dysplasia. I do not think he needs endoscopy      more than every three years.  2. History of recurrent colon polyps with followup examination due in      July of 2011.  3. Chronic functional constipation.   RECOMMENDATIONS:  1. Renew Nexium 40 mg 30 minutes before the first meal of the day      along with standard anti-reflux maneuvers.  2. Continue high fiber diet as tolerated with liberal p.o. fluids and      p.r.n. MiraLax.  3. Endoscopic-colonoscopy followup in 2-3 years.     Vania Rea. Jarold Motto, MD, Caleen Essex, FAGA  Electronically Signed    DRP/MedQ  DD: 02/20/2007  DT: 02/20/2007  Job #: [id number]

## 2010-08-17 NOTE — Op Note (Signed)
Jason [name], Jason [name]                ACCOUNT NO.:  [id number]   MEDICAL RECORD NO.:  [id number]          PATIENT TYPE:  AMB   LOCATION:  [facility]                         FACILITY[facility]   PHYSICIAN:  Marlowe Kays, M.D.  DATE OF BIRTH:  Dec 19, 1927   DATE OF PROCEDURE:  06/19/2008  DATE OF DISCHARGE:                               OPERATIVE REPORT   PREOPERATIVE DIAGNOSES:  1. Carpometacarpal arthritis, right thumb.  2. Chronic right little trigger finger, right hand.   POSTOPERATIVE DIAGNOSES:  1. Carpometacarpal arthritis, right thumb.  2. Chronic right little trigger finger, right hand.   OPERATION:  1. Carpometacarpal interposition arthroplasty, right thumb.  2. Release of A1 pulley and trigger finger, little finger right hand.   ASSISTANT:  Mr. Idolina Primer for #1.   ANESTHESIA:  General.   PATHOLOGY AND JUSTIFICATION FOR PROCEDURE:  He has had chronic pain and  tenderness at the carpometacarpal joint of his right thumb, with  arthritic changes on x-ray and has failed to respond to the usual  nonoperative options.  He also has no palmaris longus tendon.  Right  little finger likewise has failed to respond to nonsurgical options.  Accordingly, he is here today for the above-mentioned surgery.   PROCEDURE:  Prophylactic antibiotics, satisfactory general anesthesia,  pneumatic tourniquet applied and the right upper extremity esmarched out  nonsterilely.  The arm was prepped from midforearm to fingertips with  DuraPrep, draped in sterile field.  Time-out performed.  I first made a  transverse incision midway between the distal palmar crease and the  flexor crease of the little finger.  The surgical tech assisted me with  this part of the procedure.  Working in the midline, the neurovascular  bundles were retracted either side and the tendon sheath and the A1  pulley were identified.  I opened both with the 15 knife blade and  released the tendon sheath proximally and then released  and resected  portion of the pulley.  Distally, it was quite thick.  I was able to  flex and extend the little finger under direct visualization and there  was no evidence for any residual compression on the flexor tendons.  I  irrigated the wound well with sterile saline and closed the subcutaneous  tissue with 4-0 Vicryl and the skin with interrupted 4-0 nylon mattress  sutures.  Mr. Idolina Primer then joined me for the assistance in the  carpometacarpal portion of the procedure.  I made a incision between the  thenar eminence and the muscles and the dorsal extensor muscles of the  thumb, with a natural interval there being identified and developed.  Opened the capsule with a 15 knife blade, demarcating the distal greater  multangular and base of the proximal phalanx of the first metacarpal.  At the metaphyseal flare of the metacarpal, I then used a MicroSaw to  amputate the base and remove the fragment with sharp dissection. I then  similarly demarcated the distal portion of the greater multangular and  trying to preserve as much of it as possible, but resect the joint.  I  used a MicroSaw,  small osteotome, rongeur and curette to remove a  squared off portion of greater multangular as well.  Then under direct  visualization, I placed  two 0.045 smooth K-wires from proximal to  distal through the cut base of the metacarpal out the ulnar side of the  metacarpal and proximal phalanx.  I then was able to redirect these with  some in the desired position anatomically into the residual greater  multangular.  This seemed to give good stabilization.  I then released  the tourniquet. There was no unusual bleeding.  After irrigating the  wound well, I packed the resection gap with Gelfoam.  I then infiltrated  the pin sites and both wounds with 0.5% plain Marcaine.  The pins were  bent and cut and capped.  I closed the incision with interrupted 4-0  Vicryl, closing down the muscular interval between  the thenar muscles  and the dorsal extensor muscles, as well as the subcutaneous tissue.  Skin was closed with a running 4-0 tailor stitch Betadine, Adaptic, and  dry sterile dressing with a thumb spica splint cast was applied.  He  tolerated the procedure well and was taken to recovery room in  satisfactory condition with no known complications.           ______________________________  Marlowe Kays, M.D.     JA/MEDQ  D:  06/19/2008  T:  06/19/2008  Job:  [id number]

## 2010-08-17 NOTE — Op Note (Signed)
[name], [name]                ACCOUNT NO.:  [id number]   MEDICAL RECORD NO.:  [id number]          PATIENT TYPE:  AMB   LOCATION:  DAY                          FACILITY:  [facility]   PHYSICIAN:  Marlowe Kays, M.D.  DATE OF BIRTH:  05-Sep-1927   DATE OF PROCEDURE:  11/27/2008  DATE OF DISCHARGE:                               OPERATIVE REPORT   PREOPERATIVE DIAGNOSIS:  Painful carpometacarpal arthritis left thumb.   POSTOPERATIVE DIAGNOSIS:  Painful carpometacarpal arthritis left thumb.   OPERATION:  Interpositional arthroplasty carpometacarpal joint left  thumb.   SURGEON:  Marlowe Kays, M.D.   ASSISTANT:  Mr. Idolina Primer, [place].   ANESTHESIA:  General.   DESCRIPTION OF PROCEDURE:  He has had a similar problem and procedure on  the right hand and has done extremely well.  He has failed nonsurgical  treatment in his left hand and he is here today for a similar procedure.   PROCEDURE:  Prophylactic antibiotics, satisfactory general anesthesia,  pneumatic tourniquet left upper extremity with the arm Esmarched out non  sterilely and tourniquet inflated 225 mmHg.  The left arm from mid  forearm to fingertips was prepped with DuraPrep, draped in sterile  field.  He does not have a palmaris longus tendon and hence, I used  Gelfoam as the interpositional material.  I made an incision along the  radial side of the thenar eminence superiorly and working my way in  between the thenar muscle and the overlying tendon structures, went down  to the capsule of the carpometacarpal joint and with subperiosteal  dissection identified the bony anatomy of the greater multangular and  the base of the first metacarpal.  Protecting the overlying structures I  amputated the base of the first metacarpal at the mass at the of the  capsule flare with a micro saw.  I then again, carefully protecting the  greater multangular structures greater multangular, I used a micro saw  to begin the osteotomy of  the distal portion of it at the anatomic  flare.  I actually ended up taking out the distal portion piecemeal  fashion to minimize any risk to the surrounding structures.  Once both  sides of the joint had been resected,  I irrigated the wound well and  then placed 2 smooth, 0.45 K-wires retrograde coming one exiting out the  metacarpal and one out the proximal phalanx of the thumb and then  redirected them proximalward stabilizing the thumb and an ideal  functional position.  I then placed Gelfoam in the gap from the  resection.  The wound was then closed in layers with interrupted 3-0  Vicryl for the musculotendinous unit there at the incision site and in  subcutaneous tissue and interrupted 4-0 mattress sutures in the skin.  Tourniquet was released.  The hand pinked up nicely.  I also infiltrated  the thumb and surrounding tissues with  0.5 percent plain Marcaine.  The two pins were bent, cut and capped.  Betadine Adaptic dry sterile dressing and then a short-arm splint cast  was applied.  He tolerated the procedure well and was taken  to recovery  room in satisfactory condition with no known complications.           ______________________________  Marlowe Kays, M.D.     JA/MEDQ  D:  11/27/2008  T:  11/27/2008  Job:  [id number]

## 2010-09-17 ENCOUNTER — Ambulatory Visit (HOSPITAL_COMMUNITY)
Admission: RE | Admit: 2010-09-17 | Discharge: 2010-09-17 | Disposition: A | Discharge status: Home / Self Care | Payer: Medicare Other | Source: Ambulatory Visit | Attending: Oncology | Admitting: Oncology

## 2010-09-17 ENCOUNTER — Other Ambulatory Visit: Payer: Self-pay | Admitting: Oncology

## 2010-09-17 ENCOUNTER — Encounter (HOSPITAL_BASED_OUTPATIENT_CLINIC_OR_DEPARTMENT_OTHER): Payer: Medicare Other | Admitting: Oncology

## 2010-09-17 ENCOUNTER — Encounter (HOSPITAL_COMMUNITY): Payer: Self-pay

## 2010-09-17 DIAGNOSIS — C772 Secondary and unspecified malignant neoplasm of intra-abdominal lymph nodes: Secondary | ICD-10-CM

## 2010-09-17 DIAGNOSIS — C8589 Other specified types of non-Hodgkin lymphoma, extranodal and solid organ sites: Secondary | ICD-10-CM | POA: Insufficient documentation

## 2010-09-17 DIAGNOSIS — C8583 Other specified types of non-Hodgkin lymphoma, intra-abdominal lymph nodes: Secondary | ICD-10-CM

## 2010-09-17 DIAGNOSIS — M47814 Spondylosis without myelopathy or radiculopathy, thoracic region: Secondary | ICD-10-CM | POA: Insufficient documentation

## 2010-09-17 DIAGNOSIS — J984 Other disorders of lung: Secondary | ICD-10-CM | POA: Insufficient documentation

## 2010-09-17 LAB — CBC WITH DIFFERENTIAL/PLATELET
BASO%: 0.9 % (ref 0.0–2.0)
EOS%: 6.1 % (ref 0.0–7.0)
HCT: 40.6 % (ref 38.4–49.9)
LYMPH%: 16.8 % (ref 14.0–49.0)
MCH: 30.6 pg (ref 27.2–33.4)
MCHC: 34.3 g/dL (ref 32.0–36.0)
MCV: 89.2 fL (ref 79.3–98.0)
MONO#: 0.7 10*3/uL (ref 0.1–0.9)
MONO%: 10.9 % (ref 0.0–14.0)
NEUT%: 65.3 % (ref 39.0–75.0)
Platelets: 191 10*3/uL (ref 140–400)
RBC: 4.55 10*6/uL (ref 4.20–5.82)

## 2010-09-17 LAB — CMP (CANCER CENTER ONLY)
ALT(SGPT): 20 U/L (ref 10–47)
Alkaline Phosphatase: 51 U/L (ref 26–84)
CO2: 27 mEq/L (ref 18–33)
Potassium: 5 mEq/L — ABNORMAL HIGH (ref 3.3–4.7)
Sodium: 144 mEq/L (ref 128–145)
Total Bilirubin: 0.7 mg/dl (ref 0.20–1.60)
Total Protein: 7.1 g/dL (ref 6.4–8.1)

## 2010-09-17 LAB — MORPHOLOGY: PLT EST: ADEQUATE

## 2010-09-17 LAB — LACTATE DEHYDROGENASE: LDH: 169 U/L (ref 94–250)

## 2010-09-17 MED ORDER — IOHEXOL 300 MG/ML  SOLN
125.0000 mL | Freq: Once | INTRAMUSCULAR | Status: AC | PRN
  Administered 2010-09-17: 125 mL via INTRAVENOUS

## 2010-09-20 ENCOUNTER — Encounter: Payer: Medicare Other | Admitting: Oncology

## 2010-09-20 ENCOUNTER — Other Ambulatory Visit: Payer: Self-pay | Admitting: Oncology

## 2010-09-20 LAB — BASIC METABOLIC PANEL
BUN: 28 mg/dL — ABNORMAL HIGH (ref 6–23)
CO2: 24 mEq/L (ref 19–32)
Calcium: 9.4 mg/dL (ref 8.4–10.5)
Creatinine, Ser: 0.96 mg/dL (ref 0.50–1.35)

## 2010-09-24 ENCOUNTER — Other Ambulatory Visit: Payer: Self-pay | Admitting: Oncology

## 2010-09-24 ENCOUNTER — Encounter (HOSPITAL_BASED_OUTPATIENT_CLINIC_OR_DEPARTMENT_OTHER): Payer: Medicare Other | Admitting: Oncology

## 2010-09-24 DIAGNOSIS — C8583 Other specified types of non-Hodgkin lymphoma, intra-abdominal lymph nodes: Secondary | ICD-10-CM

## 2010-09-24 DIAGNOSIS — C8590 Non-Hodgkin lymphoma, unspecified, unspecified site: Secondary | ICD-10-CM

## 2010-11-03 ENCOUNTER — Ambulatory Visit (HOSPITAL_COMMUNITY)
Admission: RE | Admit: 2010-11-03 | Discharge: 2010-11-03 | Disposition: A | Discharge status: Home / Self Care | Payer: Medicare Other | Source: Ambulatory Visit | Attending: Oncology | Admitting: Oncology

## 2010-11-03 DIAGNOSIS — Z09 Encounter for follow-up examination after completed treatment for conditions other than malignant neoplasm: Secondary | ICD-10-CM | POA: Insufficient documentation

## 2010-11-03 DIAGNOSIS — C8589 Other specified types of non-Hodgkin lymphoma, extranodal and solid organ sites: Secondary | ICD-10-CM | POA: Insufficient documentation

## 2011-01-07 ENCOUNTER — Other Ambulatory Visit: Payer: Self-pay | Admitting: Oncology

## 2011-01-07 ENCOUNTER — Ambulatory Visit (HOSPITAL_COMMUNITY)
Admission: RE | Admit: 2011-01-07 | Discharge: 2011-01-07 | Disposition: A | Discharge status: Home / Self Care | Payer: Medicare Other | Source: Ambulatory Visit | Attending: Oncology | Admitting: Oncology

## 2011-01-07 ENCOUNTER — Encounter (HOSPITAL_BASED_OUTPATIENT_CLINIC_OR_DEPARTMENT_OTHER): Payer: Medicare Other | Admitting: Oncology

## 2011-01-07 DIAGNOSIS — C8589 Other specified types of non-Hodgkin lymphoma, extranodal and solid organ sites: Secondary | ICD-10-CM | POA: Insufficient documentation

## 2011-01-07 DIAGNOSIS — C8583 Other specified types of non-Hodgkin lymphoma, intra-abdominal lymph nodes: Secondary | ICD-10-CM

## 2011-01-07 DIAGNOSIS — J984 Other disorders of lung: Secondary | ICD-10-CM | POA: Insufficient documentation

## 2011-01-07 DIAGNOSIS — I2510 Atherosclerotic heart disease of native coronary artery without angina pectoris: Secondary | ICD-10-CM | POA: Insufficient documentation

## 2011-01-07 DIAGNOSIS — C8590 Non-Hodgkin lymphoma, unspecified, unspecified site: Secondary | ICD-10-CM

## 2011-01-07 DIAGNOSIS — Z9089 Acquired absence of other organs: Secondary | ICD-10-CM | POA: Insufficient documentation

## 2011-01-07 LAB — CBC WITH DIFFERENTIAL/PLATELET
BASO%: 0.3 % (ref 0.0–2.0)
Basophils Absolute: 0 10*3/uL (ref 0.0–0.1)
EOS%: 6.1 % (ref 0.0–7.0)
HGB: 14.2 g/dL (ref 13.0–17.1)
MCH: 31 pg (ref 27.2–33.4)
MCHC: 33.9 g/dL (ref 32.0–36.0)
MONO%: 8.5 % (ref 0.0–14.0)
RBC: 4.58 10*6/uL (ref 4.20–5.82)
RDW: 12.6 % (ref 11.0–14.6)
lymph#: 1 10*3/uL (ref 0.9–3.3)

## 2011-01-07 LAB — CMP (CANCER CENTER ONLY)
BUN, Bld: 22 mg/dL (ref 7–22)
CO2: 26 mEq/L (ref 18–33)
Creat: 1.1 mg/dl (ref 0.6–1.2)
Glucose, Bld: 97 mg/dL (ref 73–118)
Sodium: 144 mEq/L (ref 128–145)
Total Bilirubin: 1.2 mg/dl (ref 0.20–1.60)

## 2011-01-07 LAB — LACTATE DEHYDROGENASE: LDH: 196 U/L (ref 94–250)

## 2011-01-07 MED ORDER — IOHEXOL 300 MG/ML  SOLN
100.0000 mL | Freq: Once | INTRAMUSCULAR | Status: AC | PRN
  Administered 2011-01-07: 100 mL via INTRAVENOUS

## 2011-01-14 ENCOUNTER — Other Ambulatory Visit: Payer: Self-pay | Admitting: Oncology

## 2011-01-14 ENCOUNTER — Encounter (HOSPITAL_BASED_OUTPATIENT_CLINIC_OR_DEPARTMENT_OTHER): Payer: Medicare Other | Admitting: Oncology

## 2011-01-14 DIAGNOSIS — C8590 Non-Hodgkin lymphoma, unspecified, unspecified site: Secondary | ICD-10-CM

## 2011-01-14 DIAGNOSIS — Z87898 Personal history of other specified conditions: Secondary | ICD-10-CM

## 2011-04-16 ENCOUNTER — Telehealth: Payer: Self-pay | Admitting: Oncology

## 2011-04-16 NOTE — Telephone Encounter (Signed)
Called pt, left message for 05/13/11 for lab and CT. Pt will see ML on 2/15, asked pt to be NPO 4 hrs prior to scan and to pick up contrast before CT.

## 2011-05-04 ENCOUNTER — Other Ambulatory Visit: Payer: Self-pay | Admitting: Gastroenterology

## 2011-05-13 ENCOUNTER — Other Ambulatory Visit (HOSPITAL_BASED_OUTPATIENT_CLINIC_OR_DEPARTMENT_OTHER): Payer: Medicare Other | Admitting: Lab

## 2011-05-13 ENCOUNTER — Ambulatory Visit (HOSPITAL_COMMUNITY)
Admission: RE | Admit: 2011-05-13 | Discharge: 2011-05-13 | Disposition: A | Discharge status: Home / Self Care | Payer: Medicare Other | Source: Ambulatory Visit | Attending: Oncology | Admitting: Oncology

## 2011-05-13 ENCOUNTER — Encounter (HOSPITAL_COMMUNITY): Payer: Self-pay

## 2011-05-13 DIAGNOSIS — C8589 Other specified types of non-Hodgkin lymphoma, extranodal and solid organ sites: Secondary | ICD-10-CM | POA: Diagnosis not present

## 2011-05-13 DIAGNOSIS — Z9221 Personal history of antineoplastic chemotherapy: Secondary | ICD-10-CM | POA: Diagnosis not present

## 2011-05-13 DIAGNOSIS — I2510 Atherosclerotic heart disease of native coronary artery without angina pectoris: Secondary | ICD-10-CM | POA: Insufficient documentation

## 2011-05-13 DIAGNOSIS — C8583 Other specified types of non-Hodgkin lymphoma, intra-abdominal lymph nodes: Secondary | ICD-10-CM | POA: Diagnosis not present

## 2011-05-13 DIAGNOSIS — I517 Cardiomegaly: Secondary | ICD-10-CM | POA: Diagnosis not present

## 2011-05-13 DIAGNOSIS — R918 Other nonspecific abnormal finding of lung field: Secondary | ICD-10-CM | POA: Insufficient documentation

## 2011-05-13 DIAGNOSIS — I359 Nonrheumatic aortic valve disorder, unspecified: Secondary | ICD-10-CM | POA: Insufficient documentation

## 2011-05-13 DIAGNOSIS — C8590 Non-Hodgkin lymphoma, unspecified, unspecified site: Secondary | ICD-10-CM

## 2011-05-13 DIAGNOSIS — I700 Atherosclerosis of aorta: Secondary | ICD-10-CM | POA: Insufficient documentation

## 2011-05-13 DIAGNOSIS — Z9089 Acquired absence of other organs: Secondary | ICD-10-CM | POA: Insufficient documentation

## 2011-05-13 DIAGNOSIS — Z87898 Personal history of other specified conditions: Secondary | ICD-10-CM | POA: Diagnosis not present

## 2011-05-13 LAB — CBC WITH DIFFERENTIAL/PLATELET
BASO%: 0.7 % (ref 0.0–2.0)
Basophils Absolute: 0 10*3/uL (ref 0.0–0.1)
HCT: 41 % (ref 38.4–49.9)
HGB: 14 g/dL (ref 13.0–17.1)
MCHC: 34.2 g/dL (ref 32.0–36.0)
MONO#: 0.7 10*3/uL (ref 0.1–0.9)
NEUT#: 5 10*3/uL (ref 1.5–6.5)
NEUT%: 68.9 % (ref 39.0–75.0)
WBC: 7.3 10*3/uL (ref 4.0–10.3)
lymph#: 1.1 10*3/uL (ref 0.9–3.3)

## 2011-05-13 LAB — CMP (CANCER CENTER ONLY)
ALT(SGPT): 20 U/L (ref 10–47)
CO2: 29 mEq/L (ref 18–33)
Calcium: 8.9 mg/dL (ref 8.0–10.3)
Chloride: 104 mEq/L (ref 98–108)
Creat: 1.1 mg/dl (ref 0.6–1.2)

## 2011-05-13 MED ORDER — IOHEXOL 300 MG/ML  SOLN: 100.0000 mL | Freq: Once | INTRAMUSCULAR | Status: AC | PRN

## 2011-05-14 LAB — LACTATE DEHYDROGENASE: LDH: 165 U/L (ref 94–250)

## 2011-05-16 ENCOUNTER — Telehealth: Payer: Self-pay

## 2011-05-16 NOTE — Telephone Encounter (Signed)
Pt notified by phone per Dr Patsy Lager note - CXR, CT normal. dph

## 2011-05-16 NOTE — Telephone Encounter (Signed)
Message copied by Albertha Ghee on Mon May 16, 2011  5:08 PM ------      Message from: Levert Feinstein      Created: Sun May 15, 2011 11:01 AM       Call pt CXR, CT, lab all normal

## 2011-05-17 ENCOUNTER — Telehealth: Payer: Self-pay | Admitting: *Deleted

## 2011-05-17 NOTE — Telephone Encounter (Signed)
Pt notified that CXR, CT, & lab all normal per Dr. Cyndie Chime.

## 2011-05-17 NOTE — Telephone Encounter (Signed)
Message copied by Sabino Snipes on Tue May 17, 2011  5:16 PM ------      Message from: Levert Feinstein      Created: Sun May 15, 2011 11:01 AM       Call pt CXR, CT, lab all normal

## 2011-05-20 ENCOUNTER — Ambulatory Visit (HOSPITAL_BASED_OUTPATIENT_CLINIC_OR_DEPARTMENT_OTHER): Payer: Medicare Other | Admitting: Nurse Practitioner

## 2011-05-20 ENCOUNTER — Telehealth: Payer: Self-pay | Admitting: Oncology

## 2011-05-20 VITALS — BP 145/77 | HR 66 | Temp 97.0°F | Ht 72.0 in | Wt 246.7 lb

## 2011-05-20 DIAGNOSIS — C8590 Non-Hodgkin lymphoma, unspecified, unspecified site: Secondary | ICD-10-CM

## 2011-05-20 DIAGNOSIS — C8583 Other specified types of non-Hodgkin lymphoma, intra-abdominal lymph nodes: Secondary | ICD-10-CM | POA: Diagnosis not present

## 2011-05-20 NOTE — Telephone Encounter (Signed)
Gave pt, calenda appt for June 2013 and oral contrast for CT on 09/09/11

## 2011-05-20 NOTE — Progress Notes (Signed)
OFFICE PROGRESS NOTE  Interval history:  [name] is 78 year old man diagnosed with high-grade B-cell non-Hodgkin's lymphoma October 2011 presenting at that time with abdominal pain. He was found to have bulky retroperitoneal lymphadenopathy. He was treated with CHOP/Rituxan 01/08/2010 through 04/23/2010.   Restaging CT scans of the abdomen and pelvis 05/13/2011 showed no evidence of recurrent disease. There were multiple tiny nodules in the lung bases bilaterally which were predominately unchanged compared to the prior examination. One notable exception was a 6 mm nodule in the left lower lobe. Chest x-ray 05/15/2011 showed mild chronic interstitial change which was stable and stable cardiomegaly.   Objective: Blood pressure 145/77, pulse 66, temperature 97 F (36.1 C), temperature source Oral, height 6' (1.829 m), weight 246 lb 11.2 oz (111.902 kg).  Jason [name] denies fevers or sweats. He has a good appetite. He estimates that he has gained 11 pounds since his last visit here. No shortness of breath or chest pain. No cough. Bowels moving regularly. No urinary problems. His only complaint is pain in the right leg which he states is coming from his back. He has had a steroid injections in the past.  Lab Results: Lab Results  Component Value Date   WBC 7.3 05/13/2011   HGB 14.0 05/13/2011   HCT 41.0 05/13/2011   MCV 92.1 05/13/2011   PLT 189 05/13/2011    Chemistry:    Chemistry      Component Value Date/Time   NA 145 05/13/2011 1132   NA 136 09/20/2010 1147   K 4.7 05/13/2011 1132   K 4.5 09/20/2010 1147   CL 104 05/13/2011 1132   CL 104 09/20/2010 1147   CO2 29 05/13/2011 1132   CO2 24 09/20/2010 1147   BUN 23* 05/13/2011 1132   BUN 28* 09/20/2010 1147   CREATININE 1.1 05/13/2011 1132   CREATININE 0.96 09/20/2010 1147      Component Value Date/Time   CALCIUM 8.9 05/13/2011 1132   CALCIUM 9.4 09/20/2010 1147   ALKPHOS 35 05/13/2011 1132   ALKPHOS 36* 05/07/2010 1340   AST 25 05/13/2011 1132   AST 17  05/07/2010 1340   ALT 11 05/07/2010 1340   BILITOT 0.70 05/13/2011 1132   BILITOT 0.3 05/07/2010 1340       Studies/Results: Dg Chest 2 View  05/13/2011  *RADIOLOGY REPORT*  Clinical Data: History of lymphoma, former smoker  CHEST - 2 VIEW  Comparison: Chest x-ray of 03/26/2010 and CT chest of 01/07/2011  Findings: No active infiltrate or effusion is seen.  Mild chronic interstitial change is present and stable.  Cardiomegaly is stable. There are degenerative changes throughout the thoracic spine with  IMPRESSION: Stable chronic change in cardiomegaly.  No active lung disease.  Original Report Authenticated By: Juline Patch, M.D.   Ct Abdomen Pelvis W Contrast  05/13/2011  *RADIOLOGY REPORT*  Clinical Data: History of lymphoma of the stomach diagnosed in 2011 and status post chemotherapy, completed in January 2012.  CT ABDOMEN AND PELVIS WITH CONTRAST  Technique:  Multidetector CT imaging of the abdomen and pelvis was performed following the standard protocol during bolus administration of intravenous contrast.  Contrast:  100 ml of Omnipaque-300.  Comparison: CT of chest abdomen and pelvis dated 01/07/2011.  Findings:  Lung Bases: Multiple small pulmonary nodules are seen in the lung bases bilaterally, the largest of which measures up to 6 mm in diameter in the left lower lobe (image five of series three), which is new compared to the prior examination  from 01/07/2011.  5 mm nodule in the right lower lobe (image 8 of series 3) is unchanged compared to the prior examination.  Mild diffuse interstitial prominence and bronchial wall thickening is also noted, similar to prior examination. There is atherosclerosis of the thoracic aorta, the great vessels of the mediastinum and the coronary arteries, including calcified atherosclerotic plaque in the left circumflex coronary arteries. Aortic valve calcifications.  Abdomen/Pelvis:  Status post cholecystectomy.  The enhanced appearance of the liver, pancreas, spleen,  bilateral adrenal glands and the left kidney is unremarkable.  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  Remainder of the right kidney is otherwise unremarkable.  Amorphous infiltrative soft tissue is again noted in the small bowel mesentery, which is difficult to measure accurately because of its irregular shape and appearance.  However, this is very similar to prior study from 01/07/2011.  No definite pathologically enlarged lymph nodes are noted within the abdomen or pelvis.  No new soft tissue masses are identified.  There is some colonic wall thickening centered around the hepatic flexure of the colon, however, this may be accentuated by relative decompression of the colon throughout these regions.  No ascites or pneumoperitoneum and no pathologic distension of bowel.  Urinary bladder is unremarkable.  Musculoskeletal: There are no aggressive appearing lytic or blastic lesions noted in the visualized portions of the skeleton.  IMPRESSION: 1.  No findings in the abdomen or pelvis to suggest recurrent disease. 2. Multiple tiny nodules in the lung bases bilaterally, predominately unchanged compared to prior examination.  One notable exception is a 6 mm nodule in the left lower lobe. If the patient is at high risk for bronchogenic carcinoma, follow-up chest CT at 6- 12 months is recommended.  If the patient is at low risk for bronchogenic carcinoma, follow-up chest CT at 12 months is recommended.  This recommendation follows the consensus statement: Guidelines for Management of Small Pulmonary Nodules Detected on CT Scans: A Statement from the Fleischner Society as published in Radiology 2005; 237:395-400. Online at: DietDisorder.cz. 3. Apparent colonic wall thickening in the region of the hepatic flexure.  This finding is likely accentuated by relative decompression of bowel  in this region, however, correlation for signs of symptoms of colitis is recommended. 4.  Atherosclerosis, including  coronary artery disease. Please note that although the presence of coronary artery calcium documents the presence of coronary artery disease, the severity of this disease and any potential stenosis cannot be assessed on this non-gated CT examination.  Assessment for potential risk factor modification, dietary therapy or pharmacologic therapy may be warranted, if clinically indicated. 5 There are calcifications of the aortic valve.  Echocardiographic correlation for evaluation of potential valvular dysfunction may be warranted if clinically indicated. 6.  Status post cholecystectomy.  Original Report Authenticated By: Florencia Reasons, M.D.    Medications: I have reviewed the patient's current medications.  Assessment/Plan: 1. High-grade B-cell non-Hodgkin's lymphoma diagnosed October 2011 presenting with abdominal pain and subsequently found to have bulky retroperitoneal lymphadenopathy. He completed CHOP/Rituxan chemotherapy 01/08/2010 through 04/23/2010 achieving a complete response.  2. Small bilateral pulmonary nodules, chronic. We will continue to follow with routine CT scans for followup of the non-Hodgkin's lymphoma.   Disposition-Jason [name] appears stable. He remains in remission from the non-Hodgkin's lymphoma. We will repeat labs and CT scans at a 4 month interval. We will continue to follow the small  bilateral pulmonary nodules on CT as well. He will return for a followup visit with Dr. Cyndie Chime in June of this year. He will contact the office the interim with any problems.   Lonna Cobb ANP/GNP-BC

## 2011-05-24 ENCOUNTER — Other Ambulatory Visit: Payer: Self-pay | Admitting: Nurse Practitioner

## 2011-05-24 DIAGNOSIS — C8590 Non-Hodgkin lymphoma, unspecified, unspecified site: Secondary | ICD-10-CM

## 2011-05-25 ENCOUNTER — Telehealth: Payer: Self-pay | Admitting: Oncology

## 2011-05-25 NOTE — Telephone Encounter (Signed)
per 02/19 ordes cancelled cxr and added ct chest to ct scan appt on 06/07.  time did not change

## 2011-06-02 ENCOUNTER — Other Ambulatory Visit: Payer: Self-pay | Admitting: Gastroenterology

## 2011-06-08 ENCOUNTER — Other Ambulatory Visit: Payer: Self-pay | Admitting: Gastroenterology

## 2011-06-08 DIAGNOSIS — M79609 Pain in unspecified limb: Secondary | ICD-10-CM | POA: Diagnosis not present

## 2011-06-08 DIAGNOSIS — M47817 Spondylosis without myelopathy or radiculopathy, lumbosacral region: Secondary | ICD-10-CM | POA: Diagnosis not present

## 2011-06-08 DIAGNOSIS — M4310 Spondylolisthesis, site unspecified: Secondary | ICD-10-CM | POA: Diagnosis not present

## 2011-06-09 ENCOUNTER — Other Ambulatory Visit: Payer: Self-pay | Admitting: Gastroenterology

## 2011-06-09 NOTE — Telephone Encounter (Signed)
Informed pt we do not have any samples of Nexium; he can call back Monday and see if any have come in. Pt asked me to order some and I informed him he needs an OV; pt has an appt on 06/14/11.  Pt stated he will see [place] Tuesday, 06/14/11.

## 2011-06-13 ENCOUNTER — Encounter: Payer: Self-pay | Admitting: *Deleted

## 2011-06-14 ENCOUNTER — Encounter: Payer: Self-pay | Admitting: Gastroenterology

## 2011-06-14 ENCOUNTER — Ambulatory Visit (INDEPENDENT_AMBULATORY_CARE_PROVIDER_SITE_OTHER): Payer: Medicare Other | Admitting: Gastroenterology

## 2011-06-14 VITALS — BP 126/76 | HR 72 | Ht 72.0 in | Wt 248.0 lb

## 2011-06-14 DIAGNOSIS — K219 Gastro-esophageal reflux disease without esophagitis: Secondary | ICD-10-CM | POA: Diagnosis not present

## 2011-06-14 DIAGNOSIS — K5730 Diverticulosis of large intestine without perforation or abscess without bleeding: Secondary | ICD-10-CM

## 2011-06-14 DIAGNOSIS — K5909 Other constipation: Secondary | ICD-10-CM | POA: Insufficient documentation

## 2011-06-14 DIAGNOSIS — K5900 Constipation, unspecified: Secondary | ICD-10-CM

## 2011-06-14 MED ORDER — ESOMEPRAZOLE MAGNESIUM 40 MG PO CPDR: DELAYED_RELEASE_CAPSULE | ORAL | Status: DC

## 2011-06-14 NOTE — Progress Notes (Signed)
This is a very complex 78 year old Caucasian male was recently completed chemotherapy for non-Hodgkin's lymphoma involving the small bowel and mesentery. He has a long history of acid reflux, Barrett's mucosa, and every 2-3 years endoscopy with dysplasia screening over the last 30 years. He also has a history of recurrent colon polyps and had a negative colonoscopy except for small adenoma in April of 2012. Review of his pathology shows no evidence of dysplasia on his esophageal biopsies. He currently is completely asymptomatic on Nexium 40 mg a day. Denies dysphagia or hepatobiliary complaints. Prior problems of included diverticulosis and chronic constipation with associated gas and bloating. The patient follows a regular diet and denies any specific food intolerances, anorexia, weight loss, or current abdominal pain. He also denies melena, hematochezia, fever, chills, or other systemic complaints. He is under the care of Dr. Cephas Darby in oncology.  Current Medications, Allergies, Past Medical History, Past Surgical History, Family History and Social History were reviewed in [employer] record.  Pertinent Review of Systems Negative.... cardiovascular or pulmonary complaints. His been treated previously for prostate cancer, and denies genitourinary difficulties.   Physical Exam: Healthy-appearing patient in no acute distress. Blood pressure 126/76, pulse 72 and regular, and BMI of 33.63. Cannot appreciate stigmata of chronic liver disease. His chest is generally clear in the appearance to be in a regular rhythm without murmurs gallops or rubs. He has an obese abdomen , but no organomegaly, masses, tenderness, or abnormal bowel sounds. Mental status is clear peripheral extremities are unremarkable.    Assessment and Plan: His acid reflux is under good control with Nexium 40 mg a day which was renewed. I do not think he needs followup endoscopy his age and no pathologic  evidence of dysplasia over 30 years for surveillance. He also is up-to-date on his colonoscopy exams. For his constipation I placed him on a high fiber diet with liberal by mouth fluids, and daily Metamucil. Advised him to continue his other medications as listed and reviewed with GI followup as needed. No diagnosis found.

## 2011-06-14 NOTE — Patient Instructions (Addendum)
Your prescription(s) have been sent to you pharmacy.  Follow high fiber diet below and buy metamucil OTC and take it once a day.  Follow up in one year with Dr Alfredia Ferguson Fiber Diet A high fiber diet changes your normal diet to include more whole grains, legumes, fruits, and vegetables. Changes in the diet involve replacing refined carbohydrates with unrefined foods. The calorie level of the diet is essentially unchanged. The Dietary Reference Intake (recommended amount) for adult males is 38 g per day. For adult females, it is 25 g per day. Pregnant and lactating women should consume 28 g of fiber per day. Fiber is the intact part of a plant that is not broken down during digestion. Functional fiber is fiber that has been isolated from the plant to provide a beneficial effect in the body. PURPOSE  Increase stool bulk.   Ease and regulate bowel movements.   Lower cholesterol.  INDICATIONS THAT YOU NEED MORE FIBER  Constipation and hemorrhoids.   Uncomplicated diverticulosis (intestine condition) and irritable bowel syndrome.   Weight management.   As a protective measure against hardening of the arteries (atherosclerosis), diabetes, and cancer.  NOTE OF CAUTION If you have a digestive or bowel problem, ask your caregiver for advice before adding high fiber foods to your diet. Some of the following medical problems are such that a high fiber diet should not be used without consulting your caregiver:  Acute diverticulitis (intestine infection).   Partial small bowel obstructions.   Complicated diverticular disease involving bleeding, rupture (perforation), or abscess (boil, furuncle).   Presence of autonomic neuropathy (nerve damage) or gastric paresis (stomach cannot empty itself).  GUIDELINES FOR INCREASING FIBER  Start adding fiber to the diet slowly. A gradual increase of about 5 more grams (2 slices of whole-wheat bread, 2 servings of most fruits or vegetables, or 1 bowl of  high fiber cereal) per day is best. Too rapid an increase in fiber may result in constipation, flatulence, and bloating.   Drink enough water and fluids to keep your urine clear or pale yellow. Water, juice, or caffeine-free drinks are recommended. Not drinking enough fluid may cause constipation.   Eat a variety of high fiber foods rather than one type of fiber.   Try to increase your intake of fiber through using high fiber foods rather than fiber pills or supplements that contain small amounts of fiber.   The goal is to change the types of food eaten. Do not supplement your present diet with high fiber foods, but replace foods in your present diet.  INCLUDE A VARIETY OF FIBER SOURCES  Replace refined and processed grains with whole grains, canned fruits with fresh fruits, and incorporate other fiber sources. White rice, white breads, and most bakery goods contain little or no fiber.   Brown whole-grain rice, buckwheat oats, and many fruits and vegetables are all good sources of fiber. These include: broccoli, Brussels sprouts, cabbage, cauliflower, beets, sweet potatoes, white potatoes (skin on), carrots, tomatoes, eggplant, squash, berries, fresh fruits, and dried fruits.   Cereals appear to be the richest source of fiber. Cereal fiber is found in whole grains and bran. Bran is the fiber-rich outer coat of cereal grain, which is largely removed in refining. In whole-grain cereals, the bran remains. In breakfast cereals, the largest amount of fiber is found in those with "bran" in their names. The fiber content is sometimes indicated on the label.   You may need to include additional fruits and vegetables  each day.   In baking, for 1 cup white flour, you may use the following substitutions:   1 cup whole-wheat flour minus 2 tbs.    cup white flour plus  cup whole-wheat flour.  Document Released: 03/21/2005 Document Revised: 03/10/2011 Document Reviewed: 01/27/2009 [facility] Patient  Information 2012 [place], [place].

## 2011-06-16 ENCOUNTER — Other Ambulatory Visit: Payer: Self-pay | Admitting: Physician Assistant

## 2011-06-16 DIAGNOSIS — L723 Sebaceous cyst: Secondary | ICD-10-CM | POA: Diagnosis not present

## 2011-06-17 DIAGNOSIS — Z79899 Other long term (current) drug therapy: Secondary | ICD-10-CM | POA: Diagnosis not present

## 2011-06-17 DIAGNOSIS — E7800 Pure hypercholesterolemia, unspecified: Secondary | ICD-10-CM | POA: Diagnosis not present

## 2011-07-07 ENCOUNTER — Emergency Department (HOSPITAL_COMMUNITY): Payer: Medicare Other

## 2011-07-07 ENCOUNTER — Emergency Department (HOSPITAL_COMMUNITY)
Admission: EM | Admit: 2011-07-07 | Discharge: 2011-07-08 | Disposition: A | Discharge status: Home / Self Care | Payer: Medicare Other | Attending: Emergency Medicine | Admitting: Emergency Medicine

## 2011-07-07 ENCOUNTER — Telehealth: Payer: Self-pay

## 2011-07-07 ENCOUNTER — Encounter (HOSPITAL_COMMUNITY): Payer: Self-pay

## 2011-07-07 DIAGNOSIS — K279 Peptic ulcer, site unspecified, unspecified as acute or chronic, without hemorrhage or perforation: Secondary | ICD-10-CM | POA: Diagnosis not present

## 2011-07-07 DIAGNOSIS — R110 Nausea: Secondary | ICD-10-CM | POA: Diagnosis not present

## 2011-07-07 DIAGNOSIS — R1013 Epigastric pain: Secondary | ICD-10-CM | POA: Insufficient documentation

## 2011-07-07 DIAGNOSIS — K219 Gastro-esophageal reflux disease without esophagitis: Secondary | ICD-10-CM | POA: Insufficient documentation

## 2011-07-07 DIAGNOSIS — K289 Gastrojejunal ulcer, unspecified as acute or chronic, without hemorrhage or perforation: Secondary | ICD-10-CM | POA: Insufficient documentation

## 2011-07-07 DIAGNOSIS — C8589 Other specified types of non-Hodgkin lymphoma, extranodal and solid organ sites: Secondary | ICD-10-CM | POA: Diagnosis not present

## 2011-07-07 DIAGNOSIS — E785 Hyperlipidemia, unspecified: Secondary | ICD-10-CM | POA: Diagnosis not present

## 2011-07-07 DIAGNOSIS — R109 Unspecified abdominal pain: Secondary | ICD-10-CM

## 2011-07-07 LAB — URINALYSIS, ROUTINE W REFLEX MICROSCOPIC
Bilirubin Urine: NEGATIVE
Glucose, UA: NEGATIVE mg/dL
Hgb urine dipstick: NEGATIVE
Ketones, ur: NEGATIVE mg/dL
Nitrite: NEGATIVE
Protein, ur: NEGATIVE mg/dL
Specific Gravity, Urine: 1.019 (ref 1.005–1.030)
Urobilinogen, UA: 1 mg/dL (ref 0.0–1.0)
pH: 5.5 (ref 5.0–8.0)

## 2011-07-07 LAB — CBC
HCT: 41.4 % (ref 39.0–52.0)
Hemoglobin: 14.2 g/dL (ref 13.0–17.0)
MCH: 31.4 pg (ref 26.0–34.0)
MCHC: 34.3 g/dL (ref 30.0–36.0)
MCV: 91.6 fL (ref 78.0–100.0)
Platelets: 188 K/uL (ref 150–400)
RBC: 4.52 MIL/uL (ref 4.22–5.81)
RDW: 12.2 % (ref 11.5–15.5)
WBC: 9.1 K/uL (ref 4.0–10.5)

## 2011-07-07 LAB — DIFFERENTIAL
Basophils Absolute: 0 K/uL (ref 0.0–0.1)
Basophils Relative: 0 % (ref 0–1)
Eosinophils Absolute: 0.3 K/uL (ref 0.0–0.7)
Eosinophils Relative: 3 % (ref 0–5)
Lymphocytes Relative: 14 % (ref 12–46)
Lymphs Abs: 1.2 K/uL (ref 0.7–4.0)
Monocytes Absolute: 0.8 K/uL (ref 0.1–1.0)
Monocytes Relative: 9 % (ref 3–12)
Neutro Abs: 6.8 K/uL (ref 1.7–7.7)
Neutrophils Relative %: 75 % (ref 43–77)

## 2011-07-07 LAB — COMPREHENSIVE METABOLIC PANEL
CO2: 23 mEq/L (ref 19–32)
Calcium: 9.2 mg/dL (ref 8.4–10.5)
Chloride: 102 mEq/L (ref 96–112)
Creatinine, Ser: 1 mg/dL (ref 0.50–1.35)
GFR calc Af Amer: 79 mL/min — ABNORMAL LOW (ref >90)
GFR calc non Af Amer: 68 mL/min — ABNORMAL LOW (ref >90)
Glucose, Bld: 103 mg/dL — ABNORMAL HIGH (ref 70–99)
Total Bilirubin: 0.4 mg/dL (ref 0.3–1.2)

## 2011-07-07 LAB — URINE MICROSCOPIC-ADD ON

## 2011-07-07 MED ORDER — ONDANSETRON HCL 4 MG/2ML IJ SOLN
4.0000 mg | Freq: Once | INTRAMUSCULAR | Status: AC
  Administered 2011-07-07: 4 mg via INTRAVENOUS
  Filled 2011-07-07: qty 2

## 2011-07-07 MED ORDER — ONDANSETRON 4 MG PO TBDP: 4.0000 mg | ORAL_TABLET | Freq: Three times a day (TID) | ORAL | Status: AC | PRN

## 2011-07-07 MED ORDER — HYDROMORPHONE HCL PF 1 MG/ML IJ SOLN
1.0000 mg | Freq: Once | INTRAMUSCULAR | Status: AC
  Administered 2011-07-07: 1 mg via INTRAVENOUS
  Filled 2011-07-07: qty 1

## 2011-07-07 MED ORDER — IOHEXOL 300 MG/ML  SOLN
100.0000 mL | Freq: Once | INTRAMUSCULAR | Status: AC | PRN
  Administered 2011-07-07: 100 mL via INTRAVENOUS

## 2011-07-07 MED ORDER — FENTANYL CITRATE 0.05 MG/ML IJ SOLN
50.0000 ug | Freq: Once | INTRAMUSCULAR | Status: AC
  Administered 2011-07-07: via INTRAVENOUS
  Filled 2011-07-07: qty 2

## 2011-07-07 MED ORDER — SODIUM CHLORIDE 0.9 % IV SOLN
INTRAVENOUS | Status: DC
  Administered 2011-07-07: 21:00:00 via INTRAVENOUS

## 2011-07-07 MED ORDER — OXYCODONE-ACETAMINOPHEN 7.5-325 MG PO TABS: 1.0000 | ORAL_TABLET | ORAL | Status: DC | PRN

## 2011-07-07 NOTE — Telephone Encounter (Signed)
Received call from pt stating that he is having "stomach pain like when I got diagnosed with lymphoma."  Per pt, pain started this am.  Pt had BM today after being constipated for "a few days."  Reports "maybe a little" nausea.  Denies vomiting or need for anti-emetic.    Instructed pt per Misty Stanley, NP - scans scheduled for June can be moved up to next week. Pt states "maybe I can just see how I'm feeling tomorrow."  Instructed pt to call tomorrow with an update & if he continues to have pain, scans will be r/s.   Verbalizes he is comfortable with this plan & will call tomorrow. dph

## 2011-07-07 NOTE — Discharge Instructions (Signed)

## 2011-07-07 NOTE — ED Provider Notes (Signed)
History     CSN: [id number]  Arrival date & time 07/07/11  1842   First MD Initiated Contact with Patient 07/07/11 2048      Chief Complaint  Patient presents with  . Abdominal Pain    HPI Pt c/o upper abdominal since this am with nausea, denies vomiting, last normal BM this afternoon.  Patient has known history of gastric carcinoma.  Had a CT scan done in February which was unremarkable.  Patient's had nausea but no vomiting.  Denies fever.  Denies diarrhea.  Denies distention.  Pain is primarily in the epigastric region.  Patient has previous surgical history of cholecystectomy.  Past Medical History  Diagnosis Date  . Hyperlipidemia   . GERD (gastroesophageal reflux disease)   . Recurrent cold sores   . Personal history of colonic polyps 07/15/2010    tubular adenoma  . Stomach cancer   . Barrett esophagus   . Bladder cancer   . PUD (peptic ulcer disease)   . Hiatal hernia   . Diverticulosis of colon (without mention of hemorrhage)   . Non Hodgkin's lymphoma   . Prostate cancer     patient denies    Past Surgical History  Procedure Date  . Bone spur     left shoulder  . Wrist surgery     Bilateral  . Cholecystectomy   . Appendectomy   . Trigger finger release     bilateral hands  . Tendon repair     right arm  . Laser of prostate w/ green light pvp     Family History  Problem Relation Age of Onset  . Prostate cancer Father   . Bone cancer Brother   . Cancer Brother     back    History  Substance Use Topics  . Smoking status: Former Smoker    Quit date: 10/29/1966  . Smokeless tobacco: Former Neurosurgeon    Quit date: 06/29/1998  . Alcohol Use: No      Review of Systems  All other systems reviewed and are negative.    Allergies  Naprelan  Home Medications   Current Outpatient Rx  Name Route Sig Dispense Refill  . ASPIRIN 81 MG PO TABS Oral Take 81 mg by mouth daily.      Marland Kitchen ESOMEPRAZOLE MAGNESIUM 40 MG PO CPDR  TAKE 1 CAPSULE BY MOUTH ONCE  DAILY 30 capsule 11  . OMEGA-3 FATTY ACIDS 1000 MG PO CAPS Oral Take 1 g by mouth 3 (three) times daily.      . ICAPS MV PO Oral Take by mouth 2 (two) times daily.      Marland Kitchen NAPROXEN SODIUM 220 MG PO TABS Oral Take 440 mg by mouth 2 (two) times daily as needed.    Marland Kitchen POLYETHYLENE GLYCOL 3350 PO PACK Oral Take 17 g by mouth daily.      . PSYLLIUM 95 % PO PACK Oral Take 1 packet by mouth daily.    Marland Kitchen SIMVASTATIN 40 MG PO TABS Oral Take 40 mg by mouth at bedtime.      Marland Kitchen ONDANSETRON 4 MG PO TBDP Oral Take 1 tablet (4 mg total) by mouth every 8 (eight) hours as needed for nausea. 20 tablet 0  . OXYCODONE-ACETAMINOPHEN 7.5-325 MG PO TABS Oral Take 1 tablet by mouth every 4 (four) hours as needed for pain. 30 tablet 0    BP 162/71  Pulse 73  Temp(Src) 98.9 F (37.2 C) (Oral)  Resp 18  SpO2 98%  Physical  Exam  Nursing note and vitals reviewed. Constitutional: He is oriented to person, place, and time. He appears well-developed and well-nourished. No distress.  HENT:  Head: Normocephalic and atraumatic.  Eyes: Pupils are equal, round, and reactive to light.  Neck: Normal range of motion.  Cardiovascular: Normal rate and intact distal pulses.   Pulmonary/Chest: No respiratory distress.  Abdominal: Soft. Normal appearance and bowel sounds are normal. He exhibits no distension. There is tenderness in the epigastric area. There is no rebound and no guarding.         No abdominal wall hernias noted  Musculoskeletal: Normal range of motion.  Neurological: He is alert and oriented to person, place, and time. No cranial nerve deficit.  Skin: Skin is warm and dry. No rash noted.  Psychiatric: He has a normal mood and affect. His behavior is normal.    ED Course  Procedures (including critical care time) Scheduled Meds:    . fentaNYL  50 mcg Intravenous Once  .  HYDROmorphone (DILAUDID) injection  1 mg Intravenous Once  . ondansetron  4 mg Intravenous Once   Continuous Infusions:    . sodium  chloride 125 mL/hr at 07/07/11 2120   PRN Meds:.iohexol  Labs Reviewed  URINALYSIS, ROUTINE W REFLEX MICROSCOPIC - Abnormal; Notable for the following:    Leukocytes, UA MODERATE (*)    All other components within normal limits  URINE MICROSCOPIC-ADD ON - Abnormal; Notable for the following:    Bacteria, UA FEW (*)    All other components within normal limits  COMPREHENSIVE METABOLIC PANEL - Abnormal; Notable for the following:    Glucose, Bld 103 (*)    Alkaline Phosphatase 31 (*)    GFR calc non Af Amer 68 (*)    GFR calc Af Amer 79 (*)    All other components within normal limits  CBC  DIFFERENTIAL  LIPASE, BLOOD   Ct Abdomen Pelvis W Contrast  07/07/2011  *RADIOLOGY REPORT*  Clinical Data: Abdominal pain.  Nausea.  Recorded history of stomach cancer, bladder cancer, peptic ulcer disease, lymphoma, and prostate cancer.  CT ABDOMEN AND PELVIS WITH CONTRAST  Technique:  Multidetector CT imaging of the abdomen and pelvis was performed following the standard protocol during bolus administration of intravenous contrast.  Contrast:  100 ml Omnipaque 300  Comparison: 05/13/2011  Findings: Atelectasis and fibrosis in the lung bases.  Tiny nodular opacities throughout the lung bases appear stable since the previous study.  Prominent visceral adipose tissues.  Surgical absence of the gallbladder.  The stomach is well distended.  There is suggestion of focal wall thickening along the greater curvature although this may just be due to peristalsis.  Given the patient's history, endoscopy may be warranted to exclude recurrent mass or inflammatory process.  The remainder the stomach is otherwise unremarkable.  The liver, spleen, adrenal glands, and kidneys are unremarkable. Calcification in the right renal hilum which might represent small aneurysm, stable.  Calcification of the abdominal aorta without aneurysm.  Retroperitoneal lymph nodes are not pathologically enlarged.  Diffuse fatty infiltration of the  pancreas.  There is a suggestion of mild infiltration in the root of the mesentery which might represent scarring or inflammatory process.  This is stable since the previous study.  No free air or free fluid in the abdomen.  Small bowel are not distended.  Stool filled colon without distension or wall thickening.  Pelvis:  The prostate gland is not enlarged.  The bladder wall is not thickened.  No  free or loculated pelvic fluid collections.  No significant pelvic lymphadenopathy.  No inflammatory change in the sigmoid colon.  The appendix is not visualized but there is no evidence of inflammatory change in the right lower quadrant. Degenerative changes in the lumbar spine.  No destructive bone lesion or bone sclerosis appreciated.  IMPRESSION: The linear and nodular interstitial changes in the lung bases, stable since prior study.  Suggestion of focal wall thickening along the greater curvature of the stomach which is nonspecific and may represent normal peristalsis although endoscopy should be considered to exclude recurrent neoplasm or inflammatory change. Infiltration or scarring in the root of the mesentery is again demonstrated and stable since the previous study.  Original Report Authenticated By: Marlon Pel, M.D.     1. Abdominal  pain, other specified site       MDM  After treatment in the ED the patient feels back to baseline and wants to go home.         Nelia Shi, MD 07/07/11 [phone]

## 2011-07-07 NOTE — ED Notes (Signed)
Pt c/o upper abdominal since this am with nausea, denies vomiting, last normal BM this afternoon.

## 2011-07-08 ENCOUNTER — Telehealth: Payer: Self-pay | Admitting: *Deleted

## 2011-07-08 ENCOUNTER — Telehealth: Payer: Self-pay | Admitting: Gastroenterology

## 2011-07-08 ENCOUNTER — Telehealth: Payer: Self-pay

## 2011-07-08 MED ORDER — OXYCODONE-ACETAMINOPHEN 7.5-325 MG PO TABS: 1.0000 | ORAL_TABLET | ORAL | Status: AC | PRN

## 2011-07-08 NOTE — Telephone Encounter (Signed)
Called pt to explain I will be referring him back to Dr Cyndie Chime. Pt stated he has a problem with meds given in the ER. There was only one pharmacy open at 1am and they only had 3 tabs of 1 med and 2 of the other. Pt was prescribed Percocet # 30 and Zofran ODT #20; pharmacy is telling him he will need a new script. Called Sabino Snipes, [facility] who will ask Dr Cyndie Chime if he will write the script. lmom for his current RN to call me back about the original call. Informed pt I will call him back; pt states the percocet is really helping.  Pt's cell [phone]

## 2011-07-08 NOTE — Telephone Encounter (Signed)
Dr Jarold Motto, could you review the CT scan and advise? Last ECL was last year. Thanks.

## 2011-07-08 NOTE — ED Notes (Signed)
Received call from[facility] [address] states that they were only able to give patient 5 percocet out of 30 during the night. By dispensing 5 that voided the rest of the prescription. I contacted Dr Linwood Dibbles he states he can not print another. Unable to write paper prescription (none available). I advised [facility].

## 2011-07-08 NOTE — Telephone Encounter (Signed)
See previous encounter. Called [facility] [address] [facility] [address] and he will have the zofran transferred to his pharmacy and fill. Informed pt to pick up his script for percocet.

## 2011-07-08 NOTE — Telephone Encounter (Signed)
Received call from pt stating his abd pain had gotten more severe overnight & he went to the ED.  CT was performed and "there was no cancer," per pt.  Pt states he was instructed to contact Dr Norval Gable office and schedule an endoscopy.  Pt has a call in to GI.  Pt states "just wanted to let Dr Cyndie Chime know."    dph

## 2011-07-08 NOTE — Telephone Encounter (Signed)
Does not need endoscopy. He should call Dr. Cyndie Chime.. history of bowel lymphoma. He had endoscopy 1 year ago.

## 2011-07-08 NOTE — Telephone Encounter (Signed)
Received call from [facility] RN/Dr.Patterson stating that pt was seen in the ED last hs & CT was done & was told to f/u with Dr. Jarold Motto for Endo.  Dr. Jarold Motto looked at report & doesn't think he needs to be seen or endo repeated.  The pt was given a script for # 30 oxycodone/tylenol 7.5/325 & zofran .  [facility] & they only had 2 or 3 tabs which was given to the pt but will need a new script for more.  Aram Beecham wants to know if Dr. Cyndie Chime will write new script for pt.

## 2011-07-12 ENCOUNTER — Other Ambulatory Visit: Payer: Self-pay | Admitting: Gastroenterology

## 2011-08-16 DIAGNOSIS — R109 Unspecified abdominal pain: Secondary | ICD-10-CM | POA: Diagnosis not present

## 2011-08-25 DIAGNOSIS — H3530 Unspecified macular degeneration: Secondary | ICD-10-CM | POA: Diagnosis not present

## 2011-08-25 DIAGNOSIS — H259 Unspecified age-related cataract: Secondary | ICD-10-CM | POA: Diagnosis not present

## 2011-08-25 DIAGNOSIS — H0019 Chalazion unspecified eye, unspecified eyelid: Secondary | ICD-10-CM | POA: Diagnosis not present

## 2011-08-25 DIAGNOSIS — H5315 Visual distortions of shape and size: Secondary | ICD-10-CM | POA: Diagnosis not present

## 2011-09-09 ENCOUNTER — Other Ambulatory Visit (HOSPITAL_COMMUNITY): Payer: Medicare Other

## 2011-09-09 ENCOUNTER — Ambulatory Visit (HOSPITAL_COMMUNITY)
Admission: RE | Admit: 2011-09-09 | Discharge: 2011-09-09 | Disposition: A | Discharge status: Home / Self Care | Payer: Medicare Other | Source: Ambulatory Visit | Attending: Nurse Practitioner | Admitting: Nurse Practitioner

## 2011-09-09 ENCOUNTER — Other Ambulatory Visit (HOSPITAL_BASED_OUTPATIENT_CLINIC_OR_DEPARTMENT_OTHER): Payer: Medicare Other | Admitting: Lab

## 2011-09-09 DIAGNOSIS — I722 Aneurysm of renal artery: Secondary | ICD-10-CM | POA: Insufficient documentation

## 2011-09-09 DIAGNOSIS — Z9221 Personal history of antineoplastic chemotherapy: Secondary | ICD-10-CM | POA: Diagnosis not present

## 2011-09-09 DIAGNOSIS — E042 Nontoxic multinodular goiter: Secondary | ICD-10-CM | POA: Insufficient documentation

## 2011-09-09 DIAGNOSIS — I517 Cardiomegaly: Secondary | ICD-10-CM | POA: Diagnosis not present

## 2011-09-09 DIAGNOSIS — C8589 Other specified types of non-Hodgkin lymphoma, extranodal and solid organ sites: Secondary | ICD-10-CM | POA: Insufficient documentation

## 2011-09-09 DIAGNOSIS — I700 Atherosclerosis of aorta: Secondary | ICD-10-CM | POA: Insufficient documentation

## 2011-09-09 DIAGNOSIS — J984 Other disorders of lung: Secondary | ICD-10-CM | POA: Insufficient documentation

## 2011-09-09 DIAGNOSIS — R918 Other nonspecific abnormal finding of lung field: Secondary | ICD-10-CM | POA: Diagnosis not present

## 2011-09-09 DIAGNOSIS — I2510 Atherosclerotic heart disease of native coronary artery without angina pectoris: Secondary | ICD-10-CM | POA: Insufficient documentation

## 2011-09-09 DIAGNOSIS — C8590 Non-Hodgkin lymphoma, unspecified, unspecified site: Secondary | ICD-10-CM

## 2011-09-09 LAB — CBC WITH DIFFERENTIAL/PLATELET
Eosinophils Absolute: 0.4 10*3/uL (ref 0.0–0.5)
HCT: 40.8 % (ref 38.4–49.9)
LYMPH%: 17.1 % (ref 14.0–49.0)
MCHC: 33.8 g/dL (ref 32.0–36.0)
MCV: 90.7 fL (ref 79.3–98.0)
MONO#: 1 10*3/uL — ABNORMAL HIGH (ref 0.1–0.9)
MONO%: 11.9 % (ref 0.0–14.0)
NEUT#: 5.4 10*3/uL (ref 1.5–6.5)
NEUT%: 65.5 % (ref 39.0–75.0)
Platelets: 211 10*3/uL (ref 140–400)
WBC: 8.2 10*3/uL (ref 4.0–10.3)

## 2011-09-09 LAB — CMP (CANCER CENTER ONLY)
CO2: 28 mEq/L (ref 18–33)
Creat: 1.3 mg/dl — ABNORMAL HIGH (ref 0.6–1.2)
Glucose, Bld: 102 mg/dL (ref 73–118)
Total Bilirubin: 0.9 mg/dl (ref 0.20–1.60)

## 2011-09-09 MED ORDER — IOHEXOL 300 MG/ML  SOLN
100.0000 mL | Freq: Once | INTRAMUSCULAR | Status: AC | PRN
  Administered 2011-09-09: 100 mL via INTRAVENOUS

## 2011-09-13 ENCOUNTER — Telehealth: Payer: Self-pay | Admitting: *Deleted

## 2011-09-13 NOTE — Telephone Encounter (Signed)
Message copied by Sabino Snipes on Tue Sep 13, 2011  3:54 PM ------      Message from: Levert Feinstein      Created: Tue Sep 13, 2011 12:11 PM       Call pt CT negative for recurrent lymphoma

## 2011-09-13 NOTE — Telephone Encounter (Signed)
Pt. Notified of CT results per Dr. Cyndie Chime.

## 2011-09-16 ENCOUNTER — Ambulatory Visit (HOSPITAL_BASED_OUTPATIENT_CLINIC_OR_DEPARTMENT_OTHER): Payer: Medicare Other | Admitting: Oncology

## 2011-09-16 ENCOUNTER — Telehealth: Payer: Self-pay | Admitting: Oncology

## 2011-09-16 ENCOUNTER — Encounter: Payer: Self-pay | Admitting: Oncology

## 2011-09-16 VITALS — BP 127/68 | HR 85 | Temp 97.0°F | Ht 72.0 in | Wt 243.1 lb

## 2011-09-16 DIAGNOSIS — C8583 Other specified types of non-Hodgkin lymphoma, intra-abdominal lymph nodes: Secondary | ICD-10-CM | POA: Diagnosis not present

## 2011-09-16 DIAGNOSIS — C8510 Unspecified B-cell lymphoma, unspecified site: Secondary | ICD-10-CM

## 2011-09-16 DIAGNOSIS — J984 Other disorders of lung: Secondary | ICD-10-CM | POA: Diagnosis not present

## 2011-09-16 DIAGNOSIS — Z8546 Personal history of malignant neoplasm of prostate: Secondary | ICD-10-CM

## 2011-09-16 DIAGNOSIS — R918 Other nonspecific abnormal finding of lung field: Secondary | ICD-10-CM

## 2011-09-16 HISTORY — DX: Other nonspecific abnormal finding of lung field: R91.8

## 2011-09-16 NOTE — Assessment & Plan Note (Signed)
Patient denies history of prostate cancer 09/16/11

## 2011-09-16 NOTE — Telephone Encounter (Signed)
Gv pt appt for oct2013.  scheduled ct scan for 10/07 @ [facility]

## 2011-09-16 NOTE — Progress Notes (Signed)
Hematology and Oncology Follow Up Visit  Jason [name] [id number] [birth year] 78 y.o. 09/16/2011 7:49 PM   Principle Diagnosis: Encounter Diagnoses  Name Primary?  . High Grade B-Cell Lymphoma Yes  . Multiple nodules of lung   . NEOPLASM, MALIGNANT, PROSTATE, HX OF, S/P TURP      Interim History:   Followup visit for this 78 year old man diagnosed with high-grade B-cell non-Hodgkin's lymphoma in October 2011 when he presented with atypical abdominal pain and was found to have bulky mesenteric  lymphadenopathy over an area of 11 x 15 cm. No splenomegaly.. Bone marrow was negative for lymphoma. He had a small volume of ascites. Paracentesis showed cytology positive for lymphoma. I requested additional biopsy material and he underwent open biopsy of the mesenteric lymph node mass on 01/07/2010 which was diagnostic for high-grade B-cell non-Hodgkin's lymphoma. Serum LDH mildly elevated at 296 units. Uric acid normal at 5.4. He was treated with 6 cycles of CHOP plus Rituxan between January 08, 2010 and 04/23/2010. He achieved a complete radiographic response. He is doing well at this time. He had a recent flareup of abdominal pain. He was evaluated by his gastroenterologist Dr. Sheryn Bison. He had been taking Advil 4 times daily on a regular basis to control arthritic pain in his left shoulder. This in turn caused significant gastritis. He was of ice to stop the nonsteroidal and was put on a proton pump inhibitor. Within 2 weeks his symptoms resolved  CT scan chest abdomen and pelvis done on June 7 which I personally reviewed show no evidence for recurrent lymphoma. He has chronic interstitial changes in his lungs and chronic small bilateral pulmonary nodules which are unchanged compared with prior studies.   Medications: reviewed  Allergies:  Allergies  Allergen Reactions  . Naprelan (Naproxen Sodium) Other (See Comments)    Review of Systems: Constitutional:   No constitutional  symptoms Respiratory: No cough or dyspnea Cardiovascular:  No chest pain or palpitations Gastrointestinal: See above Genito-Urinary: No urinary tract symptoms Musculoskeletal: Chronic arthritic pain left shoulder Neurologic: No headache or change in vision Skin: Ecchymoses on his arms. Not on any steroids. Remaining ROS negative.  Physical Exam: Blood pressure 127/68, pulse 85, temperature 97 F (36.1 C), temperature source Oral, height 6' (1.829 m), weight 243 lb 1.6 oz (110.269 kg). Wt Readings from Last 3 Encounters:  09/16/11 243 lb 1.6 oz (110.269 kg)  06/14/11 248 lb (112.492 kg)  05/20/11 246 lb 11.2 oz (111.902 kg)     General appearance: Well-nourished Caucasian man HENNT: Pharynx no erythema or exudate Lymph nodes: No cervical supraclavicular or axillary adenopathy Breasts: Lungs: Clear to auscultation resonant to percussion Heart: Regular rhythm Abdomen: Soft nontender no mass no organomegaly Extremities: No edema Vascular: No cyanosis Neurologic: No focal deficit Skin: Thin skin with scattered ecchymoses on both of his forearms.  Lab Results: Lab Results  Component Value Date   WBC 8.2 09/09/2011   HGB 13.8 09/09/2011   HCT 40.8 09/09/2011   MCV 90.7 09/09/2011   PLT 211 09/09/2011     Chemistry      Component Value Date/Time   NA 141 09/09/2011 0917   NA 135 07/07/2011 2125   K 5.0* 09/09/2011 0917   K 4.1 07/07/2011 2125   CL 102 09/09/2011 0917   CL 102 07/07/2011 2125   CO2 28 09/09/2011 0917   CO2 23 07/07/2011 2125   BUN 23* 09/09/2011 0917   BUN 23 07/07/2011 2125   CREATININE 1.3* 09/09/2011 4098  CREATININE 1.00 07/07/2011 2125      Component Value Date/Time   CALCIUM 8.9 09/09/2011 0917   CALCIUM 9.2 07/07/2011 2125   ALKPHOS 30 09/09/2011 0917   ALKPHOS 31* 07/07/2011 2125   AST 25 09/09/2011 0917   AST 22 07/07/2011 2125   ALT 13 07/07/2011 2125   BILITOT 0.90 09/09/2011 0917   BILITOT 0.4 07/07/2011 2125       Radiological Studies: See discussion above  Impression and  Plan: #1. Stage IIIA, high intermediate IPI, high-grade B-cell non-Hodgkin's lymphoma treated as outlined above. He remains in a clinical and radiographic remission at this time. I will repeat a CT scan in 4 months which will be the 2 year anniversary of his disease. Subsequent to that we can probably just get scans on a when necessary basis.  #2. Chronic bilateral pulmonary nodules stable on serial CT scans.  #3. Barrett's esophagus  #4. Peptic ulcer disease with recent nonsteroidal induced gastritis     CC:.    Levert Feinstein, MD 6/14/20137:49 PM

## 2011-11-04 DIAGNOSIS — C679 Malignant neoplasm of bladder, unspecified: Secondary | ICD-10-CM | POA: Diagnosis not present

## 2011-11-04 DIAGNOSIS — N400 Enlarged prostate without lower urinary tract symptoms: Secondary | ICD-10-CM | POA: Diagnosis not present

## 2012-01-09 ENCOUNTER — Ambulatory Visit (HOSPITAL_COMMUNITY)
Admission: RE | Admit: 2012-01-09 | Discharge: 2012-01-09 | Disposition: A | Discharge status: Home / Self Care | Payer: Medicare Other | Source: Ambulatory Visit | Attending: Oncology | Admitting: Oncology

## 2012-01-09 ENCOUNTER — Other Ambulatory Visit: Payer: Medicare Other | Admitting: Lab

## 2012-01-09 ENCOUNTER — Ambulatory Visit (HOSPITAL_COMMUNITY): Admission: RE | Admit: 2012-01-09 | Payer: Medicare Other | Source: Ambulatory Visit

## 2012-01-09 DIAGNOSIS — C8510 Unspecified B-cell lymphoma, unspecified site: Secondary | ICD-10-CM

## 2012-01-09 DIAGNOSIS — Z8546 Personal history of malignant neoplasm of prostate: Secondary | ICD-10-CM

## 2012-01-09 DIAGNOSIS — R918 Other nonspecific abnormal finding of lung field: Secondary | ICD-10-CM | POA: Diagnosis not present

## 2012-01-09 DIAGNOSIS — C8589 Other specified types of non-Hodgkin lymphoma, extranodal and solid organ sites: Secondary | ICD-10-CM | POA: Diagnosis not present

## 2012-01-09 DIAGNOSIS — J8410 Pulmonary fibrosis, unspecified: Secondary | ICD-10-CM | POA: Diagnosis not present

## 2012-01-09 DIAGNOSIS — R911 Solitary pulmonary nodule: Secondary | ICD-10-CM | POA: Diagnosis not present

## 2012-01-09 LAB — COMPREHENSIVE METABOLIC PANEL (CC13)
AST: 26 U/L (ref 5–34)
Albumin: 3.5 g/dL (ref 3.5–5.0)
BUN: 16 mg/dL (ref 7.0–26.0)
Calcium: 9.4 mg/dL (ref 8.4–10.4)
Chloride: 104 mEq/L (ref 98–107)
Glucose: 91 mg/dl (ref 70–99)
Potassium: 4.5 mEq/L (ref 3.5–5.1)
Total Protein: 7.3 g/dL (ref 6.4–8.3)

## 2012-01-09 LAB — CBC WITH DIFFERENTIAL/PLATELET
Eosinophils Absolute: 0.3 10*3/uL (ref 0.0–0.5)
HCT: 43.4 % (ref 38.4–49.9)
LYMPH%: 16.9 % (ref 14.0–49.0)
MONO#: 1 10*3/uL — ABNORMAL HIGH (ref 0.1–0.9)
NEUT#: 5.8 10*3/uL (ref 1.5–6.5)
NEUT%: 66.8 % (ref 39.0–75.0)
Platelets: 204 10*3/uL (ref 140–400)
WBC: 8.7 10*3/uL (ref 4.0–10.3)

## 2012-01-09 LAB — LACTATE DEHYDROGENASE (CC13): LDH: 179 U/L (ref 125–220)

## 2012-01-09 MED ORDER — IOHEXOL 300 MG/ML  SOLN
100.0000 mL | Freq: Once | INTRAMUSCULAR | Status: AC | PRN
  Administered 2012-01-09: 100 mL via INTRAVENOUS

## 2012-01-10 ENCOUNTER — Telehealth: Payer: Self-pay | Admitting: *Deleted

## 2012-01-10 NOTE — Telephone Encounter (Signed)
Message copied by Orbie Hurst on Tue Jan 10, 2012  1:11 PM ------      Message from: Levert Feinstein      Created: Mon Jan 09, 2012  2:32 PM       Call pt CT negative for recurrent lymphoma  Cc to primary care

## 2012-01-10 NOTE — Telephone Encounter (Signed)
Called patient and let him know that CT is negative for recurrent lymhoma.  He understands that this is good!.  Will report to PCP who patient says is Dr. Dorthey Sawyer.  Pt. Appreciated the phone call

## 2012-01-16 ENCOUNTER — Telehealth: Payer: Self-pay | Admitting: Oncology

## 2012-01-16 ENCOUNTER — Ambulatory Visit (HOSPITAL_BASED_OUTPATIENT_CLINIC_OR_DEPARTMENT_OTHER): Payer: Medicare Other | Admitting: Oncology

## 2012-01-16 VITALS — BP 159/70 | HR 68 | Temp 97.0°F | Resp 20 | Ht 72.0 in | Wt 249.7 lb

## 2012-01-16 DIAGNOSIS — R918 Other nonspecific abnormal finding of lung field: Secondary | ICD-10-CM

## 2012-01-16 DIAGNOSIS — C8590 Non-Hodgkin lymphoma, unspecified, unspecified site: Secondary | ICD-10-CM

## 2012-01-16 DIAGNOSIS — C8589 Other specified types of non-Hodgkin lymphoma, extranodal and solid organ sites: Secondary | ICD-10-CM | POA: Diagnosis not present

## 2012-01-16 DIAGNOSIS — K2270 Barrett's esophagus without dysplasia: Secondary | ICD-10-CM

## 2012-01-16 NOTE — Progress Notes (Signed)
Hematology and Oncology Follow Up Visit  Jason [name] [id number] 1927-11-13 78 y.o. 01/16/2012 6:34 PM   Principle Diagnosis: Encounter Diagnoses  Name Primary?  . NHL (non-Hodgkin's lymphoma)   . Multiple nodules of lung Yes     Interim History:   Followup visit for this 78 year old man diagnosed with high-grade B-cell non-Hodgkin's lymphoma in October 2011 when he presented with atypical abdominal pain and was found to have bulky mesenteric lymphadenopathy over an area of 11 x 15 cm. No splenomegaly.. Bone marrow was negative for lymphoma. He had a small volume of ascites. Paracentesis showed cytology positive for lymphoma. I requested additional biopsy material and he underwent open biopsy of the mesenteric lymph node mass on 01/07/2010 which was diagnostic for high-grade B-cell non-Hodgkin's lymphoma. Serum LDH mildly elevated at 296 units. Uric acid normal at 5.4. He was treated with 6 cycles of CHOP plus Rituxan between January 08, 2010 and 04/23/2010. He achieved a complete radiographic response. He is doing well at this time. He has had no interim medical problems. He denies any abdominal pain. No change in bowel habit. Appetite is good. He has gained weight.  CT scan  abdomen and pelvis and a chest x-ray done in anticipation of today's visit on October 14 which I personally reviewed shows no sign of recurrent lymphoma.   Medications: reviewed  Allergies:  Allergies  Allergen Reactions  . Naprelan (Naproxen Sodium) Other (See Comments)    Review of Systems: Constitutional:   No constitutional symptoms Respiratory: No cough or dyspnea Cardiovascular:  No chest pain or palpitations Gastrointestinal: See above Genito-Urinary: No urinary tract symptoms Musculoskeletal: He is getting intermittent aching in his thighs. He does use an exercise bicycle at home. Neurologic: No headache or change in vision. No paresthesias Skin: No rash or ecchymosis Remaining ROS  negative.  Physical Exam: Blood pressure 159/70, pulse 68, temperature 97 F (36.1 C), temperature source Oral, resp. rate 20, height 6' (1.829 m), weight 249 lb 11.2 oz (113.263 kg). Wt Readings from Last 3 Encounters:  01/16/12 249 lb 11.2 oz (113.263 kg)  09/16/11 243 lb 1.6 oz (110.269 kg)  06/14/11 248 lb (112.492 kg)     General appearance: Well-nourished Caucasian man HENNT: Pharynx no erythema or exudate Lymph nodes: No cervical, supraclavicular, axillary, or inguinal adenopathy Breasts: Lungs: Clear to auscultation resonant to percussion Heart: Regular rhythm no murmur Abdomen: Soft, obese, nontender, no mass, no organomegaly Extremities: No edema no calf tenderness Vascular: No cyanosis Neurologic: Motor strength 5 over 5 reflexes 1+ symmetric Skin: No rash or ecchymosis  Lab Results: Lab Results  Component Value Date   WBC 8.7 01/09/2012   HGB 14.7 01/09/2012   HCT 43.4 01/09/2012   MCV 88.9 01/09/2012   PLT 204 01/09/2012     Chemistry      Component Value Date/Time   NA 137 01/09/2012 0953   NA 141 09/09/2011 0917   NA 135 07/07/2011 2125   K 4.5 01/09/2012 0953   K 5.0* 09/09/2011 0917   K 4.1 07/07/2011 2125   CL 104 01/09/2012 0953   CL 102 09/09/2011 0917   CL 102 07/07/2011 2125   CO2 23 01/09/2012 0953   CO2 28 09/09/2011 0917   CO2 23 07/07/2011 2125   BUN 16.0 01/09/2012 0953   BUN 23* 09/09/2011 0917   BUN 23 07/07/2011 2125   CREATININE 1.1 01/09/2012 0953   CREATININE 1.3* 09/09/2011 0917   CREATININE 1.00 07/07/2011 2125      Component  Value Date/Time   CALCIUM 9.4 01/09/2012 0953   CALCIUM 8.9 09/09/2011 0917   CALCIUM 9.2 07/07/2011 2125   ALKPHOS 43 01/09/2012 0953   ALKPHOS 30 09/09/2011 0917   ALKPHOS 31* 07/07/2011 2125   AST 26 01/09/2012 0953   AST 25 09/09/2011 0917   AST 22 07/07/2011 2125   ALT 15 01/09/2012 0953   ALT 13 07/07/2011 2125   BILITOT 0.50 01/09/2012 0953   BILITOT 0.90 09/09/2011 0917   BILITOT 0.4 07/07/2011 2125       Radiological Studies: Ct Chest  W Contrast  01/09/2012  *RADIOLOGY REPORT*  Clinical Data:  Non Hodgkin's lymphoma diagnosed 2011.  Lung nodules.  Additional history of prostate cancer.  CT CHEST, ABDOMEN AND PELVIS WITH CONTRAST  Technique:  Multidetector CT imaging of the chest, abdomen and pelvis was performed following the standard protocol during bolus administration of intravenous contrast.  Contrast: OMNIPAQUE IOHEXOL 300 MG/ML  SOLN  Comparison:  CT 09/09/2011  CT CHEST  Findings:  No axillary or supraclavicular lymphadenopathy.  No mediastinal or hilar lymphadenopathy.  Coronary artery calcifications are present.  No pericardial fluid.  Esophagus is normal.  Review of the lung parenchyma again demonstrates a ill-defined ground-glass nodule in the superior segment of the left lower lobe which measure 9 mm unchanged from 9 mm on prior.  No new or suspicious pulmonary nodules.  There is linear interstitial thickening at the lung bases which is chronic.  Subpleural nodule in the most inferior right lung base measuring 6 mm is stable.  IMPRESSION:  1.  No evidence of lymphoma recurrence in the thorax.  2.  Stable ground-glass nodule in the left lower lobe.  3.  Stable chronic interstitial lung disease at the lung bases.  CT ABDOMEN AND PELVIS  Findings:  No focal hepatic lesion.  Post cholecystectomy. Pancreas, spleen, adrenal glands, kidneys are normal.  The stomach, small bowel, and colon are normal.  Abdominal aorta is normal caliber.  There is no retroperitoneal or periportal lymphadenopathy.  Within the central mesentery,  there is thickening at the root of the mesentery which is not changed in pattern compared to prior  In the pelvis, there is no lymphadenopathy.  Prostate gland bladder normal. Review of  bone windows demonstrates no aggressive osseous lesions.  IMPRESSION:  1.  Stable mesenteric thickening at the root the mesentery consistent with treated lymphoma. 2.  No evidence of new adenopathy in the abdomen or  pelvis. 3.   Normal spleen.   Original Report Authenticated By: Genevive Bi, M.D.    Ct Abdomen Pelvis W Contrast  01/09/2012  *RADIOLOGY REPORT*  Clinical Data:  Non Hodgkin's lymphoma diagnosed 2011.  Lung nodules.  Additional history of prostate cancer.  CT CHEST, ABDOMEN AND PELVIS WITH CONTRAST  Technique:  Multidetector CT imaging of the chest, abdomen and pelvis was performed following the standard protocol during bolus administration of intravenous contrast.  Contrast: OMNIPAQUE IOHEXOL 300 MG/ML  SOLN  Comparison:  CT 09/09/2011  CT CHEST  Findings:  No axillary or supraclavicular lymphadenopathy.  No mediastinal or hilar lymphadenopathy.  Coronary artery calcifications are present.  No pericardial fluid.  Esophagus is normal.  Review of the lung parenchyma again demonstrates a ill-defined ground-glass nodule in the superior segment of the left lower lobe which measure 9 mm unchanged from 9 mm on prior.  No new or suspicious pulmonary nodules.  There is linear interstitial thickening at the lung bases which is chronic.  Subpleural nodule in  the most inferior right lung base measuring 6 mm is stable.  IMPRESSION:  1.  No evidence of lymphoma recurrence in the thorax.  2.  Stable ground-glass nodule in the left lower lobe.  3.  Stable chronic interstitial lung disease at the lung bases.  CT ABDOMEN AND PELVIS  Findings:  No focal hepatic lesion.  Post cholecystectomy. Pancreas, spleen, adrenal glands, kidneys are normal.  The stomach, small bowel, and colon are normal.  Abdominal aorta is normal caliber.  There is no retroperitoneal or periportal lymphadenopathy.  Within the central mesentery,  there is thickening at the root of the mesentery which is not changed in pattern compared to prior  In the pelvis, there is no lymphadenopathy.  Prostate gland bladder normal. Review of  bone windows demonstrates no aggressive osseous lesions.  IMPRESSION:  1.  Stable mesenteric thickening at the root the mesentery  consistent with treated lymphoma. 2.  No evidence of new adenopathy in the abdomen or  pelvis. 3.  Normal spleen.   Original Report Authenticated By: Genevive Bi, M.D.     Impression and Plan:  #1. Stage IIIA, high intermediate IPI, high-grade B-cell non-Hodgkin's lymphoma treated as outlined above.  He remains in a clinical and radiographic remission now out 2 years from diagnosis. I will decrease frequency of scans every 6 months for a year 3 and then only on a when necessary basis after that. He is advised that he has a very high chance of being cured if he reaches the 3 year point.    #2. Chronic bilateral pulmonary nodules stable on serial CT scans.  #3. Barrett's esophagus  #4. Peptic ulcer disease    CC:. Dr. Judithann Sauger; Dr. Sheryn Bison  Levert Feinstein, MD 10/14/20136:34 PM

## 2012-01-16 NOTE — Telephone Encounter (Signed)
appts made and printed for pt aom °

## 2012-01-31 DIAGNOSIS — Z23 Encounter for immunization: Secondary | ICD-10-CM | POA: Diagnosis not present

## 2012-04-25 DIAGNOSIS — H259 Unspecified age-related cataract: Secondary | ICD-10-CM | POA: Diagnosis not present

## 2012-04-25 DIAGNOSIS — H5315 Visual distortions of shape and size: Secondary | ICD-10-CM | POA: Diagnosis not present

## 2012-04-25 DIAGNOSIS — H35379 Puckering of macula, unspecified eye: Secondary | ICD-10-CM | POA: Diagnosis not present

## 2012-04-25 DIAGNOSIS — H3530 Unspecified macular degeneration: Secondary | ICD-10-CM | POA: Diagnosis not present

## 2012-05-27 ENCOUNTER — Other Ambulatory Visit: Payer: Self-pay | Admitting: Gastroenterology

## 2012-06-19 DIAGNOSIS — E7800 Pure hypercholesterolemia, unspecified: Secondary | ICD-10-CM | POA: Diagnosis not present

## 2012-06-19 DIAGNOSIS — Z79899 Other long term (current) drug therapy: Secondary | ICD-10-CM | POA: Diagnosis not present

## 2012-06-21 DIAGNOSIS — H353290 Exudative age-related macular degeneration, unspecified eye, stage unspecified: Secondary | ICD-10-CM | POA: Diagnosis not present

## 2012-07-09 ENCOUNTER — Other Ambulatory Visit: Payer: Self-pay | Admitting: Oncology

## 2012-07-09 ENCOUNTER — Other Ambulatory Visit (HOSPITAL_BASED_OUTPATIENT_CLINIC_OR_DEPARTMENT_OTHER): Payer: Medicare Other

## 2012-07-09 ENCOUNTER — Ambulatory Visit (HOSPITAL_COMMUNITY)
Admission: RE | Admit: 2012-07-09 | Discharge: 2012-07-09 | Disposition: A | Discharge status: Home / Self Care | Payer: Medicare Other | Source: Ambulatory Visit | Attending: Oncology | Admitting: Oncology

## 2012-07-09 ENCOUNTER — Encounter (HOSPITAL_COMMUNITY): Payer: Self-pay

## 2012-07-09 DIAGNOSIS — N269 Renal sclerosis, unspecified: Secondary | ICD-10-CM | POA: Insufficient documentation

## 2012-07-09 DIAGNOSIS — Z9221 Personal history of antineoplastic chemotherapy: Secondary | ICD-10-CM | POA: Diagnosis not present

## 2012-07-09 DIAGNOSIS — C8589 Other specified types of non-Hodgkin lymphoma, extranodal and solid organ sites: Secondary | ICD-10-CM | POA: Diagnosis not present

## 2012-07-09 DIAGNOSIS — Z9089 Acquired absence of other organs: Secondary | ICD-10-CM | POA: Insufficient documentation

## 2012-07-09 DIAGNOSIS — R918 Other nonspecific abnormal finding of lung field: Secondary | ICD-10-CM | POA: Insufficient documentation

## 2012-07-09 DIAGNOSIS — N2889 Other specified disorders of kidney and ureter: Secondary | ICD-10-CM | POA: Diagnosis not present

## 2012-07-09 DIAGNOSIS — C8590 Non-Hodgkin lymphoma, unspecified, unspecified site: Secondary | ICD-10-CM

## 2012-07-09 DIAGNOSIS — I517 Cardiomegaly: Secondary | ICD-10-CM | POA: Diagnosis not present

## 2012-07-09 DIAGNOSIS — I7090 Unspecified atherosclerosis: Secondary | ICD-10-CM | POA: Diagnosis not present

## 2012-07-09 LAB — COMPREHENSIVE METABOLIC PANEL (CC13)
ALT: 14 U/L (ref 0–55)
AST: 23 U/L (ref 5–34)
Albumin: 3.6 g/dL (ref 3.5–5.0)
BUN: 22.3 mg/dL (ref 7.0–26.0)
Calcium: 9.5 mg/dL (ref 8.4–10.4)
Chloride: 105 mEq/L (ref 98–107)
Potassium: 4.7 mEq/L (ref 3.5–5.1)
Sodium: 139 mEq/L (ref 136–145)
Total Protein: 7.1 g/dL (ref 6.4–8.3)

## 2012-07-09 LAB — CBC WITH DIFFERENTIAL/PLATELET
BASO%: 0.4 % (ref 0.0–2.0)
Basophils Absolute: 0 10*3/uL (ref 0.0–0.1)
EOS%: 5.1 % (ref 0.0–7.0)
HGB: 14.4 g/dL (ref 13.0–17.1)
MCH: 30.4 pg (ref 27.2–33.4)
RBC: 4.73 10*6/uL (ref 4.20–5.82)
RDW: 13.6 % (ref 11.0–14.6)
lymph#: 1.8 10*3/uL (ref 0.9–3.3)

## 2012-07-09 MED ORDER — IOHEXOL 300 MG/ML  SOLN
100.0000 mL | Freq: Once | INTRAMUSCULAR | Status: AC | PRN
  Administered 2012-07-09: 100 mL via INTRAVENOUS

## 2012-07-12 ENCOUNTER — Telehealth: Payer: Self-pay | Admitting: *Deleted

## 2012-07-12 NOTE — Telephone Encounter (Signed)
Message copied by Sabino Snipes on Thu Jul 12, 2012  1:40 PM ------      Message from: Levert Feinstein      Created: Mon Jul 09, 2012 12:55 PM       Call pt CT & CXR stable - no sign of recurrent lymphoma  Cc reports to Dr Vernon Prey ------

## 2012-07-12 NOTE — Telephone Encounter (Signed)
Pt notified of stable CT & CXR results & no sign of recurrent lymphoma per Dr Cyndie Chime.  Pt reports that he does not know Dr Vernon Prey & would like report to Dr C. Duane Lope.  This report will be routed to Dr. Tenny Craw.

## 2012-07-16 ENCOUNTER — Ambulatory Visit (HOSPITAL_BASED_OUTPATIENT_CLINIC_OR_DEPARTMENT_OTHER): Payer: Medicare Other | Admitting: Oncology

## 2012-07-16 ENCOUNTER — Telehealth: Payer: Self-pay | Admitting: Oncology

## 2012-07-16 VITALS — BP 142/59 | HR 68 | Temp 97.4°F | Resp 17 | Ht 72.0 in | Wt 250.3 lb

## 2012-07-16 DIAGNOSIS — C8590 Non-Hodgkin lymphoma, unspecified, unspecified site: Secondary | ICD-10-CM

## 2012-07-16 DIAGNOSIS — K279 Peptic ulcer, site unspecified, unspecified as acute or chronic, without hemorrhage or perforation: Secondary | ICD-10-CM | POA: Diagnosis not present

## 2012-07-16 DIAGNOSIS — K2270 Barrett's esophagus without dysplasia: Secondary | ICD-10-CM | POA: Diagnosis not present

## 2012-07-16 DIAGNOSIS — R911 Solitary pulmonary nodule: Secondary | ICD-10-CM | POA: Diagnosis not present

## 2012-07-16 DIAGNOSIS — C679 Malignant neoplasm of bladder, unspecified: Secondary | ICD-10-CM

## 2012-07-16 DIAGNOSIS — C8589 Other specified types of non-Hodgkin lymphoma, extranodal and solid organ sites: Secondary | ICD-10-CM

## 2012-07-16 DIAGNOSIS — Z8546 Personal history of malignant neoplasm of prostate: Secondary | ICD-10-CM

## 2012-07-16 DIAGNOSIS — N400 Enlarged prostate without lower urinary tract symptoms: Secondary | ICD-10-CM

## 2012-07-16 NOTE — Telephone Encounter (Signed)
Cheryl from central called and moved 10/7 lb from 9am to 9:30am due to ct is 10:30am. Elnita Maxwell will give pt new lb time.

## 2012-07-16 NOTE — Progress Notes (Signed)
Hematology and Oncology Follow Up Visit  Jason [name] [id number] [birth year] 79 y.o. 07/16/2012 2:52 PM   Principle Diagnosis: Encounter Diagnoses  Name Primary?          . NHL (non-Hodgkin's lymphoma) Yes         Interim History:    Followup visit for this 79 year old man diagnosed with high-grade B-cell non-Hodgkin's lymphoma in October 2011 when he presented with atypical abdominal pain and was found to have bulky mesenteric lymphadenopathy over an area of 11 x 15 cm. No splenomegaly.. Bone marrow was negative for lymphoma. He had a small volume of ascites. Paracentesis showed cytology positive for lymphoma. I requested additional biopsy material and he underwent open biopsy of the mesenteric lymph node mass on 01/07/2010 which was diagnostic for high-grade B-cell non-Hodgkin's lymphoma. Serum LDH mildly elevated at 296 units. Uric acid normal at 5.4. He was treated with 6 cycles of CHOP plus Rituxan between January 08, 2010 and 04/23/2010. He achieved a complete radiographic response. His response has been durable now for 2-1/2 years including scans done in anticipation of today's visit on 07/09/2012 which I personally reviewed with him and his wife today. He has had no interim medical problems. He denies any headache or change in vision, no chest pain, dyspnea, palpitations, change in bowel habit, dysuria, frequency. No abdominal pain.   Medications: reviewed  Allergies:  Allergies  Allergen Reactions  . Naprelan (Naproxen Sodium) Other (See Comments)    Review of Systems: Constitutional:   No constitutional symptoms Respiratory: See above Cardiovascular:  See above Gastrointestinal: See above Genito-Urinary: See above Musculoskeletal: Neurologic: See above Skin: Remaining ROS negative.  Physical Exam: Blood pressure 142/59, pulse 68, temperature 97.4 F (36.3 C), temperature source Oral, resp. rate 17, height 6' (1.829 m), weight 250 lb 4.8 oz (113.535 kg). Wt  Readings from Last 3 Encounters:  07/16/12 250 lb 4.8 oz (113.535 kg)  01/16/12 249 lb 11.2 oz (113.263 kg)  09/16/11 243 lb 1.6 oz (110.269 kg)     General appearance: Well-nourished Caucasian man HENNT: Pharynx no erythema or exudate Lymph nodes: No cervical, supraclavicular, or axillary adenopathy Breasts: Lungs: Clear to auscultation resonant to percussion Heart: Regular rhythm no murmur Abdomen: Soft, obese, nontender, large ventral hernia, no obvious mass or organomegaly Extremities: No edema, no calf tenderness Vascular: No cyanosis Neurologic: Motor strength 5 over 5, reflexes 1+ symmetric Skin: No rash or ecchymosis  Lab Results: Lab Results  Component Value Date   WBC 7.1 07/09/2012   HGB 14.4 07/09/2012   HCT 47.6 07/09/2012   MCV 100.6* 07/09/2012   PLT 134* 07/09/2012     Chemistry      Component Value Date/Time   NA 139 07/09/2012 0828   NA 141 09/09/2011 0917   NA 135 07/07/2011 2125   K 4.7 07/09/2012 0828   K 5.0* 09/09/2011 0917   K 4.1 07/07/2011 2125   CL 105 07/09/2012 0828   CL 102 09/09/2011 0917   CL 102 07/07/2011 2125   CO2 24 07/09/2012 0828   CO2 28 09/09/2011 0917   CO2 23 07/07/2011 2125   BUN 22.3 07/09/2012 0828   BUN 23* 09/09/2011 0917   BUN 23 07/07/2011 2125   CREATININE 1.2 07/09/2012 0828   CREATININE 1.3* 09/09/2011 0917   CREATININE 1.00 07/07/2011 2125      Component Value Date/Time   CALCIUM 9.5 07/09/2012 0828   CALCIUM 8.9 09/09/2011 0917   CALCIUM 9.2 07/07/2011 2125   ALKPHOS 43 07/09/2012  0828   ALKPHOS 30 09/09/2011 0917   ALKPHOS 31* 07/07/2011 2125   AST 23 07/09/2012 0828   AST 25 09/09/2011 0917   AST 22 07/07/2011 2125   ALT 14 07/09/2012 0828   ALT 13 07/07/2011 2125   BILITOT 0.52 07/09/2012 0828   BILITOT 0.90 09/09/2011 0917   BILITOT 0.4 07/07/2011 2125       Radiological Studies: Dg Chest 2 View  07/09/2012  *RADIOLOGY REPORT*  Clinical Data: Follow-up of treated lymphoma.  Pulmonary nodules.  CHEST - 2 VIEW  Comparison: 09/09/2011  Findings: There is stable  slight cardiomegaly.  Pulmonary vascularity is normal.  No infiltrates or effusions.  Chronic accentuation of the interstitial markings at the lung bases.  No visible pulmonary nodules.  Accentuation of the thoracic kyphosis, unchanged.  IMPRESSION: No acute disease.   Original Report Authenticated By: Francene Boyers, M.D.    Ct Abdomen Pelvis W Contrast  07/09/2012  *RADIOLOGY REPORT*  Clinical Data: History of non-Hodgkins lymphoma.  Chemotherapy is complete.  CT ABDOMEN AND PELVIS WITH CONTRAST  Technique:  Multidetector CT imaging of the abdomen and pelvis was performed following the standard protocol during bolus administration of intravenous contrast.  Contrast: OMNIPAQUE IOHEXOL 300 MG/ML  SOLN  Comparison: CT of the chest abdomen and pelvis 01/09/2012.  Findings:  Lung Bases: No change in the 6 mm subpleural nodule in the periphery of the right lower lobe (image 8 of series 6) compared to prior examination dating back to at least 03/09/2010 (this can be considered radiographically benign).  Likewise, a 4 mm nodule in the periphery of the lateral segment of the right middle lobe, and a 3 mm nodule in the inferior segment of the lingula (both on image 5 of series 6) are also unchanged and also considered benign. Interstitial prominence throughout the visualized lung bases bilaterally is unchanged.  Abdomen/Pelvis:  Status post cholecystectomy.  The appearance of the liver, pancreas, spleen and bilateral adrenal glands is unremarkable.  There is mild parenchymal atrophy in both kidneys. Left extrarenal pelvis (normal anatomical variant) incidentally noted.  Soft tissue stranding and prominence in the root of the small bowel mesentery is very similar to the prior examination, and compatible with treated lymphoma.  No definite enlarging soft tissue mass or lymphadenopathy is appreciated within the abdomen or pelvis on today's examination.  Extensive atherosclerosis throughout the abdominal and pelvic  vasculature, without evidence of aneurysm, dissection or major branch occlusion.  No significant volume of ascites, no pneumoperitoneum and no pathologic distension of small bowel. Prostate and urinary bladder are unremarkable in appearance.  Musculoskeletal: There are no aggressive appearing lytic or blastic lesions noted in the visualized portions of the skeleton.  IMPRESSION: 1.  Prominent soft tissue and stranding in the root of the small bowel mesentery, unchanged compared to prior examinations, compatible with treated lymphoma.  No signs of recurrence of disease are noted within the abdomen or pelvis on today's examination. 2.  Multiple small nodules in the lung bases, as above, unchanged and dating back to at least 03/09/2010.  These can be considered radiographically benign and require no specific imaging follow-up. 3.  Status post cholecystectomy. 4.  Extensive atherosclerosis. 5.  Additional incidental findings, as above.   Original Report Authenticated By: Trudie Reed, M.D.     Impression and Plan:  #1. Stage IIIA, high intermediate IPI, high-grade B-cell non-Hodgkin's lymphoma treated as outlined above.  He remains in a clinical and radiographic remission now out 21/2 years from diagnosis.  I will see him again in 6 months and repeat scans and labs one week in advance. He will then be 3 years and we will not need to get any routine scans unless otherwise dictated by new symptoms or physical findings. .  #2. Chronic bilateral pulmonary nodules stable on serial CT scans. Current chest x-ray unremarkable. #3. Barrett's esophagus  #4. Peptic ulcer disease   Please note: Prostate cancer was put on his problem list and he adamantly denies any knowledge of diagnosis of prostate cancer and I have removed it from his problem list.   CC:. Dr. Dorthey Sawyer; Dr. Sheryn Bison   Levert Feinstein, MD 4/14/20142:52 PM

## 2012-07-19 DIAGNOSIS — H353290 Exudative age-related macular degeneration, unspecified eye, stage unspecified: Secondary | ICD-10-CM | POA: Diagnosis not present

## 2012-08-23 DIAGNOSIS — H353290 Exudative age-related macular degeneration, unspecified eye, stage unspecified: Secondary | ICD-10-CM | POA: Diagnosis not present

## 2012-09-13 DIAGNOSIS — H353290 Exudative age-related macular degeneration, unspecified eye, stage unspecified: Secondary | ICD-10-CM | POA: Diagnosis not present

## 2012-10-11 DIAGNOSIS — H353290 Exudative age-related macular degeneration, unspecified eye, stage unspecified: Secondary | ICD-10-CM | POA: Diagnosis not present

## 2012-10-12 DIAGNOSIS — D239 Other benign neoplasm of skin, unspecified: Secondary | ICD-10-CM | POA: Diagnosis not present

## 2012-10-12 DIAGNOSIS — L570 Actinic keratosis: Secondary | ICD-10-CM | POA: Diagnosis not present

## 2012-10-12 DIAGNOSIS — L723 Sebaceous cyst: Secondary | ICD-10-CM | POA: Diagnosis not present

## 2012-11-08 DIAGNOSIS — H353290 Exudative age-related macular degeneration, unspecified eye, stage unspecified: Secondary | ICD-10-CM | POA: Diagnosis not present

## 2012-12-13 DIAGNOSIS — H353290 Exudative age-related macular degeneration, unspecified eye, stage unspecified: Secondary | ICD-10-CM | POA: Diagnosis not present

## 2013-01-08 ENCOUNTER — Encounter (HOSPITAL_COMMUNITY): Payer: Self-pay

## 2013-01-08 ENCOUNTER — Other Ambulatory Visit (HOSPITAL_BASED_OUTPATIENT_CLINIC_OR_DEPARTMENT_OTHER): Payer: Medicare Other | Admitting: Lab

## 2013-01-08 ENCOUNTER — Ambulatory Visit (HOSPITAL_COMMUNITY)
Admission: RE | Admit: 2013-01-08 | Discharge: 2013-01-08 | Disposition: A | Discharge status: Home / Self Care | Payer: Medicare Other | Source: Ambulatory Visit | Attending: Oncology | Admitting: Oncology

## 2013-01-08 DIAGNOSIS — C8589 Other specified types of non-Hodgkin lymphoma, extranodal and solid organ sites: Secondary | ICD-10-CM

## 2013-01-08 DIAGNOSIS — I700 Atherosclerosis of aorta: Secondary | ICD-10-CM | POA: Insufficient documentation

## 2013-01-08 DIAGNOSIS — R911 Solitary pulmonary nodule: Secondary | ICD-10-CM | POA: Diagnosis not present

## 2013-01-08 DIAGNOSIS — Z8546 Personal history of malignant neoplasm of prostate: Secondary | ICD-10-CM | POA: Diagnosis not present

## 2013-01-08 DIAGNOSIS — I517 Cardiomegaly: Secondary | ICD-10-CM | POA: Diagnosis not present

## 2013-01-08 DIAGNOSIS — M4000 Postural kyphosis, site unspecified: Secondary | ICD-10-CM | POA: Diagnosis not present

## 2013-01-08 DIAGNOSIS — C8590 Non-Hodgkin lymphoma, unspecified, unspecified site: Secondary | ICD-10-CM

## 2013-01-08 DIAGNOSIS — Z9089 Acquired absence of other organs: Secondary | ICD-10-CM | POA: Insufficient documentation

## 2013-01-08 LAB — COMPREHENSIVE METABOLIC PANEL (CC13)
Alkaline Phosphatase: 38 U/L — ABNORMAL LOW (ref 40–150)
Anion Gap: 8 mEq/L (ref 3–11)
CO2: 25 mEq/L (ref 22–29)
Calcium: 9.6 mg/dL (ref 8.4–10.4)
Creatinine: 1.1 mg/dL (ref 0.7–1.3)
Glucose: 99 mg/dl (ref 70–140)
Potassium: 4.6 mEq/L (ref 3.5–5.1)
Sodium: 140 mEq/L (ref 136–145)
Total Bilirubin: 0.49 mg/dL (ref 0.20–1.20)
Total Protein: 7.3 g/dL (ref 6.4–8.3)

## 2013-01-08 LAB — CBC WITH DIFFERENTIAL/PLATELET
Basophils Absolute: 0.1 10*3/uL (ref 0.0–0.1)
EOS%: 5.2 % (ref 0.0–7.0)
MCH: 30.1 pg (ref 27.2–33.4)
MCHC: 33.8 g/dL (ref 32.0–36.0)
MCV: 89.1 fL (ref 79.3–98.0)
MONO%: 10.3 % (ref 0.0–14.0)
RBC: 4.69 10*6/uL (ref 4.20–5.82)
RDW: 12.7 % (ref 11.0–14.6)
lymph#: 1.8 10*3/uL (ref 0.9–3.3)

## 2013-01-08 LAB — SEDIMENTATION RATE: Sed Rate: 9 mm/hr (ref 0–16)

## 2013-01-08 LAB — LACTATE DEHYDROGENASE (CC13): LDH: 175 U/L (ref 125–245)

## 2013-01-08 MED ORDER — IOHEXOL 300 MG/ML  SOLN
100.0000 mL | Freq: Once | INTRAMUSCULAR | Status: AC | PRN
  Administered 2013-01-08: 100 mL via INTRAVENOUS

## 2013-01-09 ENCOUNTER — Telehealth: Payer: Self-pay | Admitting: *Deleted

## 2013-01-09 NOTE — Telephone Encounter (Signed)
Message copied by Gala Romney on Wed Jan 09, 2013  3:53 PM ------      Message from: Levert Feinstein      Created: Wed Jan 09, 2013 12:57 PM       Call CT negative for recurrent lymphoma ------

## 2013-01-09 NOTE — Telephone Encounter (Signed)
Notified pt ---CT negative for recurrent lymphoma per MD.  Pt verbalized understanding.

## 2013-01-14 ENCOUNTER — Ambulatory Visit (HOSPITAL_BASED_OUTPATIENT_CLINIC_OR_DEPARTMENT_OTHER): Payer: Medicare Other | Admitting: Oncology

## 2013-01-14 ENCOUNTER — Encounter (INDEPENDENT_AMBULATORY_CARE_PROVIDER_SITE_OTHER): Payer: Self-pay

## 2013-01-14 VITALS — BP 135/72 | HR 62 | Temp 96.9°F | Resp 20 | Ht 72.0 in | Wt 232.2 lb

## 2013-01-14 DIAGNOSIS — C8583 Other specified types of non-Hodgkin lymphoma, intra-abdominal lymph nodes: Secondary | ICD-10-CM

## 2013-01-14 DIAGNOSIS — C679 Malignant neoplasm of bladder, unspecified: Secondary | ICD-10-CM | POA: Diagnosis not present

## 2013-01-14 DIAGNOSIS — R918 Other nonspecific abnormal finding of lung field: Secondary | ICD-10-CM

## 2013-01-14 DIAGNOSIS — C8590 Non-Hodgkin lymphoma, unspecified, unspecified site: Secondary | ICD-10-CM

## 2013-01-14 DIAGNOSIS — K2270 Barrett's esophagus without dysplasia: Secondary | ICD-10-CM | POA: Diagnosis not present

## 2013-01-15 ENCOUNTER — Telehealth: Payer: Self-pay | Admitting: Oncology

## 2013-01-15 NOTE — Progress Notes (Signed)
Hematology and Oncology Follow Up Visit  Jason [name] [id number] January 21, 1928 79 y.o. 01/15/2013 10:29 AM   Principle Diagnosis: Encounter Diagnoses  Name Primary?  . NHL (non-Hodgkin's lymphoma) Yes  . BLADDER CANCER   . Multiple nodules of lung      Interim History:   Followup visit for this 79 year old man diagnosed with high-grade B-cell non-Hodgkin's lymphoma in October 2011 when he presented with atypical abdominal pain and was found to have bulky mesenteric lymphadenopathy over an area of 11 x 15 cm. No splenomegaly.. Bone marrow was negative for lymphoma. He had a small volume of ascites. Paracentesis showed cytology positive for lymphoma. I requested additional biopsy material and he underwent open biopsy of the mesenteric lymph node mass on 01/07/2010 which was diagnostic for high-grade B-cell non-Hodgkin's lymphoma. Serum LDH mildly elevated at 296 units. Uric acid normal at 5.4. He was treated with 6 cycles of CHOP plus Rituxan between January 08, 2010 and 04/23/2010. He achieved a complete radiographic response. His response has been durable now for 3 years including scans done in anticipation of today's visit on  01/08/13 which I personally reviewed with him and his wife today.   He has had no interim medical problems. He denies any abdominal pain or change in bowel habit. No anorexia, weight loss, or fever.     Medications: reviewed  Allergies:  Allergies  Allergen Reactions  . Naprelan [Naproxen Sodium] Other (See Comments)    Review of Systems: Constitutional:   See above HEENT no sore throat Respiratory: No cough or dyspnea Cardiovascular:  No chest pain or palpitations Gastrointestinal: No abdominal pain or change in bowel habit Genito-Urinary: No urinary tract symptoms Musculoskeletal: No muscle bone or joint pain Neurologic: No headache or change in vision Skin: No rash or ecchymosis Remaining ROS negative.    Physical Exam: Blood pressure 135/72,  pulse 62, temperature 96.9 F (36.1 C), temperature source Oral, resp. rate 20, height 6' (1.829 m), weight 232 lb 3.2 oz (105.325 kg). Wt Readings from Last 3 Encounters:  01/14/13 232 lb 3.2 oz (105.325 kg)  07/16/12 250 lb 4.8 oz (113.535 kg)  01/16/12 249 lb 11.2 oz (113.263 kg)     General appearance: Well-nourished Caucasian man HENNT: Pharynx no erythema or exudate. No thyromegaly. Lymph nodes: No cervical, supraclavicular, axillary, or inguinal adenopathy Breasts: Lungs: Clear to auscultation resonant to percussion Heart: Regular rhythm no murmur no gallop Abdomen: Soft, nontender, no mass, no organomegaly Extremities: No edema, no calf tenderness Musculoskeletal: No joint deformities GU: Vascular: No carotid bruits, no cyanosis Neurologic: Motor strength 5 over 5, reflexes 1+ symmetric, cranial nerves grossly normal Skin: No rash or ecchymosis  Lab Results: CBC W/Diff    Component Value Date/Time   WBC 7.7 01/08/2013 0919   WBC 9.1 07/07/2011 2125   RBC 4.69 01/08/2013 0919   RBC 4.52 07/07/2011 2125   HGB 14.1 01/08/2013 0919   HGB 14.2 07/07/2011 2125   HCT 41.7 01/08/2013 0919   HCT 41.4 07/07/2011 2125   PLT 182 01/08/2013 0919   PLT 188 07/07/2011 2125   MCV 89.1 01/08/2013 0919   MCV 91.6 07/07/2011 2125   MCH 30.1 01/08/2013 0919   MCH 31.4 07/07/2011 2125   MCHC 33.8 01/08/2013 0919   MCHC 34.3 07/07/2011 2125   RDW 12.7 01/08/2013 0919   RDW 12.2 07/07/2011 2125   LYMPHSABS 1.8 01/08/2013 0919   LYMPHSABS 1.2 07/07/2011 2125   MONOABS 0.8 01/08/2013 0919   MONOABS 0.8 07/07/2011 2125  EOSABS 0.4 01/08/2013 0919   EOSABS 0.3 07/07/2011 2125   BASOSABS 0.1 01/08/2013 0919   BASOSABS 0.0 07/07/2011 2125     Chemistry      Component Value Date/Time   NA 140 01/08/2013 0919   NA 141 09/09/2011 0917   NA 135 07/07/2011 2125   K 4.6 01/08/2013 0919   K 5.0* 09/09/2011 0917   K 4.1 07/07/2011 2125   CL 105 07/09/2012 0828   CL 102 09/09/2011 0917   CL 102 07/07/2011 2125   CO2 25 01/08/2013 0919    CO2 28 09/09/2011 0917   CO2 23 07/07/2011 2125   BUN 25.1 01/08/2013 0919   BUN 23* 09/09/2011 0917   BUN 23 07/07/2011 2125   CREATININE 1.1 01/08/2013 0919   CREATININE 1.3* 09/09/2011 0917   CREATININE 1.00 07/07/2011 2125      Component Value Date/Time   CALCIUM 9.6 01/08/2013 0919   CALCIUM 8.9 09/09/2011 0917   CALCIUM 9.2 07/07/2011 2125   ALKPHOS 38* 01/08/2013 0919   ALKPHOS 30 09/09/2011 0917   ALKPHOS 31* 07/07/2011 2125   AST 22 01/08/2013 0919   AST 25 09/09/2011 0917   AST 22 07/07/2011 2125   ALT 11 01/08/2013 0919   ALT 16 09/09/2011 0917   ALT 13 07/07/2011 2125   BILITOT 0.49 01/08/2013 0919   BILITOT 0.90 09/09/2011 0917   BILITOT 0.4 07/07/2011 2125       Radiological Studies: Dg Chest 2 View  01/08/2013   CLINICAL DATA:  Followup. Three years after diagnosis of high-grade lymphoma.  EXAM: CHEST  2 VIEW  COMPARISON:  And chest radiograph 07/09/2012 and chest CT 01/09/2012  FINDINGS: Stable mild cardiomegaly. Stable mediastinal and hilar contours. Lungs are normally expanded and clear, aside from mild chronic accentuation of the interstitial lung markings at the bases. No airspace disease, pleural effusion. No gross evidence of lymphadenopathy. Stable degenerative changes of the thoracic spine, and stable accentuation of thoracic spine kyphosis. Cholecystectomy clips present.  IMPRESSION: Stable chest radiograph. No active cardiopulmonary disease.   Electronically Signed   By: Britta Mccreedy M.D.   On: 01/08/2013 09:06   Ct Abdomen Pelvis W Contrast  01/08/2013   CLINICAL DATA:  Non-Hodgkin's lymphoma diagnosed 2011, chemotherapy complete. History of bladder/prostate cancer. Prior cholecystectomy and appendectomy.  EXAM: CT ABDOMEN AND PELVIS WITH CONTRAST  TECHNIQUE: Multidetector CT imaging of the abdomen and pelvis was performed using the standard protocol following bolus administration of intravenous contrast.  CONTRAST:  OMNIPAQUE IOHEXOL 300 MG/ML  SOLN  COMPARISON:  07/09/2012   FINDINGS: Stable 6 mm subpleural nodule at the right lung base (series 5/image 5), demonstrating greater than 2 year stability, benign.  Liver, pancreas, and adrenal glands are within normal limits.  Spleen is normal size.  Status post cholecystectomy. No intrahepatic or extrahepatic ductal dilatation.  Kidneys are within normal limits. Left extrarenal pelvis. No hydronephrosis.  No evidence of bowel obstruction.  Atherosclerotic calcifications of the abdominal aorta and branch vessels.  No abdominopelvic ascites.  Stable minimal soft tissue/stranding along the root of the small bowel mesentery (series 2/ image 45), compatible with treated lymphoma. No suspicious abdominopelvic lymphadenopathy.  Prostate is notable for prior TURP defect (series 2/image 88).  Bladder is within normal limits.  Degenerative changes of the visualized thoracolumbar spine.  IMPRESSION: Stable minimal soft tissue/stranding along the root of the small bowel mesenteric, compatible with treated lymphoma.  No findings to suggest active lymphomatous involvement in the abdomen/pelvis.   Electronically  Signed   By: Charline Bills M.D.   On: 01/08/2013 10:36    Impression: #1. Stage IIIA, high intermediate IPI, high-grade B-cell non-Hodgkin's lymphoma treated as outlined above.  He remains in a clinical and radiographic remission now out 3 years from diagnosis. He has a very high chance of being cured at this point. I told him he could be discharged from our practice to be followed exclusively by his primary care physician. We no longer need to get routine followup CT scans. Any subsequent scans should be directed by change in his symptoms or exam.  #2. Chronic bilateral pulmonary nodules stable on serial CT scans. Current chest x-ray unremarkable.  #3. Barrett's esophagus  #4. Peptic ulcer disease      CC:.    Levert Feinstein, MD 10/14/201410:29 AM

## 2013-01-15 NOTE — Telephone Encounter (Signed)
Per 10/13 pof return prn

## 2013-01-16 DIAGNOSIS — N529 Male erectile dysfunction, unspecified: Secondary | ICD-10-CM | POA: Diagnosis not present

## 2013-01-16 DIAGNOSIS — C679 Malignant neoplasm of bladder, unspecified: Secondary | ICD-10-CM | POA: Diagnosis not present

## 2013-01-16 DIAGNOSIS — N400 Enlarged prostate without lower urinary tract symptoms: Secondary | ICD-10-CM | POA: Diagnosis not present

## 2013-01-23 DIAGNOSIS — Z23 Encounter for immunization: Secondary | ICD-10-CM | POA: Diagnosis not present

## 2013-01-24 DIAGNOSIS — H353290 Exudative age-related macular degeneration, unspecified eye, stage unspecified: Secondary | ICD-10-CM | POA: Diagnosis not present

## 2013-03-14 DIAGNOSIS — H353290 Exudative age-related macular degeneration, unspecified eye, stage unspecified: Secondary | ICD-10-CM | POA: Diagnosis not present

## 2013-04-25 DIAGNOSIS — H35379 Puckering of macula, unspecified eye: Secondary | ICD-10-CM | POA: Diagnosis not present

## 2013-04-25 DIAGNOSIS — H2510 Age-related nuclear cataract, unspecified eye: Secondary | ICD-10-CM | POA: Diagnosis not present

## 2013-04-25 DIAGNOSIS — H353290 Exudative age-related macular degeneration, unspecified eye, stage unspecified: Secondary | ICD-10-CM | POA: Diagnosis not present

## 2013-05-01 ENCOUNTER — Other Ambulatory Visit: Payer: Self-pay | Admitting: Gastroenterology

## 2013-05-01 NOTE — Telephone Encounter (Signed)
Per Caren Griffins, RN patient was transferred back to cancer center for stomach cancer. No need for f/u visits ok to send Nexium

## 2013-05-02 ENCOUNTER — Telehealth: Payer: Self-pay | Admitting: Gastroenterology

## 2013-05-02 ENCOUNTER — Other Ambulatory Visit: Payer: Self-pay | Admitting: Gastroenterology

## 2013-05-02 NOTE — Telephone Encounter (Signed)
Called patient back. I advised that I can leave two bottles of Nexium for him up front, I advised patient that I do not have a lot of samples here Patient verbalized understanding, said he just needs to get til Monday Patient will be here to pick up samples today

## 2013-06-25 DIAGNOSIS — H353290 Exudative age-related macular degeneration, unspecified eye, stage unspecified: Secondary | ICD-10-CM | POA: Diagnosis not present

## 2013-07-24 DIAGNOSIS — L219 Seborrheic dermatitis, unspecified: Secondary | ICD-10-CM | POA: Diagnosis not present

## 2013-07-24 DIAGNOSIS — D235 Other benign neoplasm of skin of trunk: Secondary | ICD-10-CM | POA: Diagnosis not present

## 2013-07-24 DIAGNOSIS — L408 Other psoriasis: Secondary | ICD-10-CM | POA: Diagnosis not present

## 2013-08-08 ENCOUNTER — Other Ambulatory Visit: Payer: Self-pay | Admitting: Gastroenterology

## 2013-08-08 ENCOUNTER — Telehealth: Payer: Self-pay | Admitting: Gastroenterology

## 2013-08-08 MED ORDER — ESOMEPRAZOLE MAGNESIUM 40 MG PO CPDR: 40.0000 mg | DELAYED_RELEASE_CAPSULE | Freq: Every day | ORAL | Status: DC

## 2013-08-08 NOTE — Telephone Encounter (Signed)
Okay to refill.  Thank you 

## 2013-08-08 NOTE — Telephone Encounter (Signed)
Patient has a follow up visit with you for 08-16-2013. Patient needs refill on Nexium. Is it okay to refill?

## 2013-08-09 DIAGNOSIS — R059 Cough, unspecified: Secondary | ICD-10-CM | POA: Diagnosis not present

## 2013-08-09 DIAGNOSIS — R05 Cough: Secondary | ICD-10-CM | POA: Diagnosis not present

## 2013-08-16 ENCOUNTER — Ambulatory Visit (INDEPENDENT_AMBULATORY_CARE_PROVIDER_SITE_OTHER): Payer: Medicare Other | Admitting: Internal Medicine

## 2013-08-16 ENCOUNTER — Encounter: Payer: Self-pay | Admitting: Internal Medicine

## 2013-08-16 VITALS — BP 126/56 | HR 66 | Ht 72.0 in | Wt 226.0 lb

## 2013-08-16 DIAGNOSIS — K2270 Barrett's esophagus without dysplasia: Secondary | ICD-10-CM

## 2013-08-16 DIAGNOSIS — Z86010 Personal history of colonic polyps: Secondary | ICD-10-CM

## 2013-08-16 DIAGNOSIS — K219 Gastro-esophageal reflux disease without esophagitis: Secondary | ICD-10-CM | POA: Diagnosis not present

## 2013-08-16 DIAGNOSIS — K5900 Constipation, unspecified: Secondary | ICD-10-CM

## 2013-08-16 MED ORDER — ESOMEPRAZOLE MAGNESIUM 40 MG PO CPDR: 40.0000 mg | DELAYED_RELEASE_CAPSULE | Freq: Every day | ORAL | Status: DC

## 2013-08-16 NOTE — Patient Instructions (Signed)
We have sent the following medications to your pharmacy for you to pick up at your convenience:  Nexium  Adjust your Miralax as discussed with Dr. Henrene Pastor

## 2013-08-16 NOTE — Progress Notes (Signed)
HISTORY OF PRESENT ILLNESS:  Jason [name] is a 80 y.o. male with multiple medical problems as listed below. Jason [name] has been a long-term patient of Dr. Verl Blalock for GERD complicated by Barrett's esophagus as well as surveillance colonoscopy. The patient presents today for routine followup regarding management of GERD. Jason [name] was last seen by Dr. Sharlett Iles a little over 2 years ago (March 2013). Jason [name] has been maintained on Nexium 40 mg daily. Jason [name] has aged out of Barrett surveillance as well as colon cancer surveillance. His last colonoscopy and upper endoscopy were performed April 2012. Those reports have been reviewed. On medication Jason [name] denies any active reflux symptoms. No dysphagia. Jason [name] does complain of chronic constipation for which she takes MiraLax once daily without good results. Needs enemas on demand. Blood work from October 2014 was unremarkable including CBC, comprehensive metabolic panel, and erythrocyte sedimentation rate.  REVIEW OF SYSTEMS:  All non-GI ROS negative except for sinus and allergy, visual change, cough  Past Medical History  Diagnosis Date  . Hyperlipidemia   . GERD (gastroesophageal reflux disease)   . Recurrent cold sores   . Personal history of colonic polyps 07/15/2010    tubular adenoma  . Barrett esophagus   . PUD (peptic ulcer disease)   . Hiatal hernia   . Diverticulosis of colon (without mention of hemorrhage)   . Multiple nodules of lung 09/16/2011  . Stomach cancer     NHL origin  . Bladder cancer dx'd 2426,8341    surg only  . Non Hodgkin's lymphoma dx'd 01/03/2010  . Prostate cancer     patient denies  . Colon polyps     adenomatous    Past Surgical History  Procedure Laterality Date  . Bone spur      left shoulder  . Wrist surgery      Bilateral  . Cholecystectomy    . Appendectomy    . Trigger finger release      bilateral hands  . Tendon repair      right arm  . Laser of prostate w/ green light pvp      Social History Jason [name]   reports that Jason [name] quit smoking about 46 years ago. Jason [name] quit smokeless tobacco use about 15 years ago. Jason [name] reports that Jason [name] does not drink alcohol or use illicit drugs.  family history includes Bone cancer in his brother; Cancer in his brother; Prostate cancer in his father.  Allergies  Allergen Reactions  . Naprelan [Naproxen Sodium] Other (See Comments)       PHYSICAL EXAMINATION: Vital signs: BP 126/56  Pulse 66  Ht 6' (1.829 m)  Wt 226 lb (102.513 kg)  BMI 30.64 kg/m2 General: Well-developed, well-nourished, no acute distress HEENT: Sclerae are anicteric, conjunctiva pink. Oral mucosa intact Lungs: Clear Heart: Regular Abdomen: soft, nontender, nondistended, no obvious ascites, no peritoneal signs, normal bowel sounds. No organomegaly. Extremities: No edema Psychiatric: alert and oriented x3. Cooperative   ASSESSMENT:  #1. GERD complicated by nondysplastic Barrett's. Asymptomatic on Nexium. Aged out of surveillance #2. History of colonic adenomas. Last colonoscopy 2012. Aged out of surveillance. #3. Chronic constipation. Not responding to current therapies   PLAN:  #1. Continue Nexium 40 mg daily. Refilled #2. Increase MiraLax to achieve desired result. Advised #3. Return to the care of PCP. GI followup as needed

## 2013-08-19 DIAGNOSIS — R059 Cough, unspecified: Secondary | ICD-10-CM | POA: Diagnosis not present

## 2013-08-19 DIAGNOSIS — R05 Cough: Secondary | ICD-10-CM | POA: Diagnosis not present

## 2013-08-20 DIAGNOSIS — H353290 Exudative age-related macular degeneration, unspecified eye, stage unspecified: Secondary | ICD-10-CM | POA: Diagnosis not present

## 2013-08-28 ENCOUNTER — Encounter: Payer: Self-pay | Admitting: Internal Medicine

## 2013-10-15 DIAGNOSIS — H353290 Exudative age-related macular degeneration, unspecified eye, stage unspecified: Secondary | ICD-10-CM | POA: Diagnosis not present

## 2013-11-28 DIAGNOSIS — E7800 Pure hypercholesterolemia, unspecified: Secondary | ICD-10-CM | POA: Diagnosis not present

## 2013-11-28 DIAGNOSIS — Z79899 Other long term (current) drug therapy: Secondary | ICD-10-CM | POA: Diagnosis not present

## 2013-11-28 DIAGNOSIS — F430 Acute stress reaction: Secondary | ICD-10-CM | POA: Diagnosis not present

## 2013-11-29 DIAGNOSIS — E7800 Pure hypercholesterolemia, unspecified: Secondary | ICD-10-CM | POA: Diagnosis not present

## 2013-11-29 DIAGNOSIS — Z79899 Other long term (current) drug therapy: Secondary | ICD-10-CM | POA: Diagnosis not present

## 2013-11-29 DIAGNOSIS — F430 Acute stress reaction: Secondary | ICD-10-CM | POA: Diagnosis not present

## 2013-12-10 DIAGNOSIS — H353290 Exudative age-related macular degeneration, unspecified eye, stage unspecified: Secondary | ICD-10-CM | POA: Diagnosis not present

## 2013-12-13 ENCOUNTER — Encounter: Payer: Self-pay | Admitting: Gastroenterology

## 2013-12-31 DIAGNOSIS — Z23 Encounter for immunization: Secondary | ICD-10-CM | POA: Diagnosis not present

## 2014-01-17 ENCOUNTER — Encounter (HOSPITAL_COMMUNITY): Payer: Self-pay | Admitting: Emergency Medicine

## 2014-01-17 ENCOUNTER — Emergency Department (HOSPITAL_COMMUNITY)
Admission: EM | Admit: 2014-01-17 | Discharge: 2014-01-17 | Disposition: A | Discharge status: Home / Self Care | Payer: Medicare Other | Attending: Emergency Medicine | Admitting: Emergency Medicine

## 2014-01-17 DIAGNOSIS — Z79899 Other long term (current) drug therapy: Secondary | ICD-10-CM | POA: Diagnosis not present

## 2014-01-17 DIAGNOSIS — Z8509 Personal history of malignant neoplasm of other digestive organs: Secondary | ICD-10-CM | POA: Diagnosis not present

## 2014-01-17 DIAGNOSIS — Z8546 Personal history of malignant neoplasm of prostate: Secondary | ICD-10-CM | POA: Insufficient documentation

## 2014-01-17 DIAGNOSIS — R6889 Other general symptoms and signs: Secondary | ICD-10-CM | POA: Diagnosis not present

## 2014-01-17 DIAGNOSIS — Z8711 Personal history of peptic ulcer disease: Secondary | ICD-10-CM | POA: Insufficient documentation

## 2014-01-17 DIAGNOSIS — K219 Gastro-esophageal reflux disease without esophagitis: Secondary | ICD-10-CM | POA: Diagnosis not present

## 2014-01-17 DIAGNOSIS — Z7982 Long term (current) use of aspirin: Secondary | ICD-10-CM | POA: Insufficient documentation

## 2014-01-17 DIAGNOSIS — Z87891 Personal history of nicotine dependence: Secondary | ICD-10-CM | POA: Diagnosis not present

## 2014-01-17 DIAGNOSIS — R040 Epistaxis: Secondary | ICD-10-CM | POA: Diagnosis not present

## 2014-01-17 DIAGNOSIS — Z8551 Personal history of malignant neoplasm of bladder: Secondary | ICD-10-CM | POA: Insufficient documentation

## 2014-01-17 DIAGNOSIS — Z8619 Personal history of other infectious and parasitic diseases: Secondary | ICD-10-CM | POA: Diagnosis not present

## 2014-01-17 DIAGNOSIS — Z86010 Personal history of colonic polyps: Secondary | ICD-10-CM | POA: Insufficient documentation

## 2014-01-17 DIAGNOSIS — E785 Hyperlipidemia, unspecified: Secondary | ICD-10-CM | POA: Insufficient documentation

## 2014-01-17 DIAGNOSIS — Z8572 Personal history of non-Hodgkin lymphomas: Secondary | ICD-10-CM | POA: Diagnosis not present

## 2014-01-17 LAB — CBC WITH DIFFERENTIAL/PLATELET
BASOS ABS: 0 10*3/uL (ref 0.0–0.1)
Basophils Relative: 1 % (ref 0–1)
EOS PCT: 6 % — AB (ref 0–5)
Eosinophils Absolute: 0.5 10*3/uL (ref 0.0–0.7)
HCT: 40.1 % (ref 39.0–52.0)
Hemoglobin: 13 g/dL (ref 13.0–17.0)
Lymphocytes Relative: 22 % (ref 12–46)
Lymphs Abs: 1.8 10*3/uL (ref 0.7–4.0)
MCH: 29.1 pg (ref 26.0–34.0)
MCHC: 32.4 g/dL (ref 30.0–36.0)
MCV: 89.7 fL (ref 78.0–100.0)
Monocytes Absolute: 1 10*3/uL (ref 0.1–1.0)
Monocytes Relative: 12 % (ref 3–12)
NEUTROS PCT: 59 % (ref 43–77)
Neutro Abs: 4.8 10*3/uL (ref 1.7–7.7)
Platelets: 165 10*3/uL (ref 150–400)
RBC: 4.47 MIL/uL (ref 4.22–5.81)
RDW: 12.3 % (ref 11.5–15.5)
WBC: 8 10*3/uL (ref 4.0–10.5)

## 2014-01-17 MED ORDER — CEPHALEXIN 500 MG PO CAPS: 500.0000 mg | ORAL_CAPSULE | Freq: Four times a day (QID) | ORAL | Status: DC

## 2014-01-17 NOTE — ED Notes (Signed)
Per EMS pt is hypertensive with a nosebleed that started this morning. Bleeding has slowed down. Pt is alert and oriented.

## 2014-01-17 NOTE — Discharge Instructions (Signed)

## 2014-01-17 NOTE — ED Notes (Signed)
Bed: BM15 Expected date: 01/17/14 Expected time: 1:06 AM Means of arrival: Ambulance Comments: 80 yo M epistaxis

## 2014-01-17 NOTE — ED Provider Notes (Signed)
CSN: [id number]     Arrival date & time 01/17/14  0119 History   First MD Initiated Contact with Patient 01/17/14 [phone]     Chief Complaint  Patient presents with  . Epistaxis    Patient is a 80 y.o. male presenting with nosebleeds. The history is provided by the patient.  Epistaxis Location:  Unable to specify Severity:  Moderate Duration:  1 day Timing:  Intermittent Progression:  Worsening Chronicity:  New Context: aspirin use   Context: not anticoagulants   Relieved by:  Nothing Worsened by:  Nothing tried Associated symptoms: no fever and no syncope   Risk factors: no frequent nosebleeds and no liver disease     Past Medical History  Diagnosis Date  . Hyperlipidemia   . GERD (gastroesophageal reflux disease)   . Recurrent cold sores   . Personal history of colonic polyps 07/15/2010    tubular adenoma  . Barrett esophagus   . PUD (peptic ulcer disease)   . Hiatal hernia   . Diverticulosis of colon (without mention of hemorrhage)   . Multiple nodules of lung 09/16/2011  . Stomach cancer     NHL origin  . Bladder cancer dx'd 3329,5188    surg only  . Non Hodgkin's lymphoma dx'd 01/03/2010  . Prostate cancer     patient denies  . Colon polyps     adenomatous   Past Surgical History  Procedure Laterality Date  . Bone spur      left shoulder  . Wrist surgery      Bilateral  . Cholecystectomy    . Appendectomy    . Trigger finger release      bilateral hands  . Tendon repair      right arm  . Laser of prostate w/ green light pvp     Family History  Problem Relation Age of Onset  . Prostate cancer Father   . Bone cancer Brother   . Cancer Brother     back   History  Substance Use Topics  . Smoking status: Former Smoker    Quit date: 10/29/1966  . Smokeless tobacco: Former Systems developer    Quit date: 06/29/1998  . Alcohol Use: No    Review of Systems  Constitutional: Negative for fever.  HENT: Positive for nosebleeds.   Cardiovascular: Negative for  syncope.  All other systems reviewed and are negative.     Allergies  Naprelan  Home Medications   Prior to Admission medications   Medication Sig Start Date End Date Taking? Authorizing Provider  aspirin 81 MG tablet Take 81 mg by mouth daily.      Historical Provider, MD  esomeprazole (NEXIUM) 40 MG capsule Take 1 capsule (40 mg total) by mouth daily. 08/16/13   Irene Shipper, MD  fish oil-omega-3 fatty acids 1000 MG capsule Take 1 g by mouth 3 (three) times daily.      Historical Provider, MD  Multiple Vitamins-Minerals (ICAPS MV PO) Take by mouth 2 (two) times daily.      Historical Provider, MD  polyethylene glycol (MIRALAX / GLYCOLAX) packet Take 17 g by mouth daily.      Historical Provider, MD  simvastatin (ZOCOR) 40 MG tablet Take 40 mg by mouth at bedtime.      Historical Provider, MD   BP 153/80  Pulse 80  Temp(Src) 97.7 F (36.5 C) (Oral)  Resp 18  SpO2 96% Physical Exam  Nursing note and vitals reviewed. Constitutional: He appears well-developed and well-nourished. No  distress.  HENT:  Head: Normocephalic and atraumatic.  Right Ear: External ear normal.  Left Ear: External ear normal.  Nose: No septal deviation or nasal septal hematoma. Epistaxis is observed.  No foreign bodies.  Blood noted both nares, right greater than left   Eyes: Conjunctivae are normal. Right eye exhibits no discharge. Left eye exhibits no discharge. No scleral icterus.  Neck: Neck supple. No tracheal deviation present.  Cardiovascular: Normal rate, regular rhythm and intact distal pulses.   Pulmonary/Chest: Effort normal and breath sounds normal. No stridor. No respiratory distress. He has no wheezes. He has no rales.  Abdominal: Soft. Bowel sounds are normal. He exhibits no distension. There is no tenderness. There is no rebound and no guarding.  Musculoskeletal: He exhibits no edema and no tenderness.  Neurological: He is alert. He has normal strength. No cranial nerve deficit (no facial  droop, extraocular movements intact, no slurred speech) or sensory deficit. He exhibits normal muscle tone. He displays no seizure activity. Coordination normal.  Skin: Skin is warm and dry. No rash noted.  Psychiatric: He has a normal mood and affect.    ED Course  EPISTAXIS MANAGEMENT Date/Time: 01/17/2014 3:53 AM Performed by: Dorie Rank Authorized by: Dorie Rank Consent: Verbal consent obtained. Risks and benefits: risks, benefits and alternatives were discussed Consent given by: patient Local anesthetic: lidocaine 1% with epinephrine Anesthetic total (ml): cotton ball soaked. Patient sedated: no Treatment site: right anterior Repair method: nasal balloon Post-procedure assessment: bleeding stopped Treatment complexity: simple Patient tolerance: Patient tolerated the procedure well with no immediate complications.   (including critical care time) Labs Review Labs Reviewed  CBC WITH DIFFERENTIAL - Abnormal; Notable for the following:    Eosinophils Relative 6 (*)    All other components within normal limits     MDM   Final diagnoses:  Epistaxis    Patient tolerated the procedure well. There is no further bleeding. We discussed follow up with his ENT doctor this week to have the packing removed    Dorie Rank, MD 01/17/14 [phone]

## 2014-01-22 DIAGNOSIS — R040 Epistaxis: Secondary | ICD-10-CM | POA: Diagnosis not present

## 2014-02-25 DIAGNOSIS — H3531 Nonexudative age-related macular degeneration: Secondary | ICD-10-CM | POA: Diagnosis not present

## 2014-02-25 DIAGNOSIS — H35371 Puckering of macula, right eye: Secondary | ICD-10-CM | POA: Diagnosis not present

## 2014-02-25 DIAGNOSIS — H43813 Vitreous degeneration, bilateral: Secondary | ICD-10-CM | POA: Diagnosis not present

## 2014-02-25 DIAGNOSIS — H3532 Exudative age-related macular degeneration: Secondary | ICD-10-CM | POA: Diagnosis not present

## 2014-03-10 ENCOUNTER — Telehealth: Payer: Self-pay | Admitting: Internal Medicine

## 2014-03-10 MED ORDER — ESOMEPRAZOLE MAGNESIUM 40 MG PO CPDR: 40.0000 mg | DELAYED_RELEASE_CAPSULE | Freq: Every day | ORAL | Status: DC

## 2014-03-10 NOTE — Telephone Encounter (Signed)
Sent generic Nexium to pharmacy.  Patient will see if this is affordable and let me know

## 2014-04-07 ENCOUNTER — Telehealth: Payer: Self-pay | Admitting: Internal Medicine

## 2014-04-09 DIAGNOSIS — H2513 Age-related nuclear cataract, bilateral: Secondary | ICD-10-CM | POA: Diagnosis not present

## 2014-04-23 DIAGNOSIS — H6120 Impacted cerumen, unspecified ear: Secondary | ICD-10-CM | POA: Diagnosis not present

## 2014-04-24 ENCOUNTER — Other Ambulatory Visit: Payer: Self-pay | Admitting: Physician Assistant

## 2014-04-24 DIAGNOSIS — D0420 Carcinoma in situ of skin of unspecified ear and external auricular canal: Secondary | ICD-10-CM | POA: Diagnosis not present

## 2014-04-24 DIAGNOSIS — L570 Actinic keratosis: Secondary | ICD-10-CM | POA: Diagnosis not present

## 2014-04-24 DIAGNOSIS — D0422 Carcinoma in situ of skin of left ear and external auricular canal: Secondary | ICD-10-CM | POA: Diagnosis not present

## 2014-04-29 ENCOUNTER — Telehealth: Payer: Self-pay | Admitting: Internal Medicine

## 2014-04-30 MED ORDER — ESOMEPRAZOLE MAGNESIUM 40 MG PO CPDR: 40.0000 mg | DELAYED_RELEASE_CAPSULE | Freq: Every day | ORAL | Status: DC

## 2014-04-30 NOTE — Telephone Encounter (Signed)
Sent generic Nexium

## 2014-05-13 DIAGNOSIS — H2511 Age-related nuclear cataract, right eye: Secondary | ICD-10-CM | POA: Diagnosis not present

## 2014-05-13 DIAGNOSIS — H25811 Combined forms of age-related cataract, right eye: Secondary | ICD-10-CM | POA: Diagnosis not present

## 2014-05-20 DIAGNOSIS — H3532 Exudative age-related macular degeneration: Secondary | ICD-10-CM | POA: Diagnosis not present

## 2014-05-30 DIAGNOSIS — G4700 Insomnia, unspecified: Secondary | ICD-10-CM | POA: Diagnosis not present

## 2014-05-30 DIAGNOSIS — Z23 Encounter for immunization: Secondary | ICD-10-CM | POA: Diagnosis not present

## 2014-06-11 DIAGNOSIS — H35351 Cystoid macular degeneration, right eye: Secondary | ICD-10-CM | POA: Diagnosis not present

## 2014-06-24 ENCOUNTER — Other Ambulatory Visit: Payer: Self-pay | Admitting: Internal Medicine

## 2014-08-08 DIAGNOSIS — L219 Seborrheic dermatitis, unspecified: Secondary | ICD-10-CM | POA: Diagnosis not present

## 2014-08-12 DIAGNOSIS — H3532 Exudative age-related macular degeneration: Secondary | ICD-10-CM | POA: Diagnosis not present

## 2014-08-15 DIAGNOSIS — R531 Weakness: Secondary | ICD-10-CM | POA: Diagnosis not present

## 2014-08-15 DIAGNOSIS — R42 Dizziness and giddiness: Secondary | ICD-10-CM | POA: Diagnosis not present

## 2014-08-15 DIAGNOSIS — R109 Unspecified abdominal pain: Secondary | ICD-10-CM | POA: Diagnosis not present

## 2014-08-15 DIAGNOSIS — K439 Ventral hernia without obstruction or gangrene: Secondary | ICD-10-CM | POA: Diagnosis not present

## 2014-08-15 DIAGNOSIS — Z8579 Personal history of other malignant neoplasms of lymphoid, hematopoietic and related tissues: Secondary | ICD-10-CM | POA: Diagnosis not present

## 2014-08-18 ENCOUNTER — Other Ambulatory Visit: Payer: Self-pay | Admitting: Family Medicine

## 2014-08-18 DIAGNOSIS — R1010 Upper abdominal pain, unspecified: Secondary | ICD-10-CM

## 2014-08-22 ENCOUNTER — Ambulatory Visit
Admission: RE | Admit: 2014-08-22 | Discharge: 2014-08-22 | Disposition: A | Discharge status: Home / Self Care | Payer: Medicare Other | Source: Ambulatory Visit | Attending: Family Medicine | Admitting: Family Medicine

## 2014-08-22 DIAGNOSIS — R1010 Upper abdominal pain, unspecified: Secondary | ICD-10-CM

## 2014-08-22 DIAGNOSIS — K429 Umbilical hernia without obstruction or gangrene: Secondary | ICD-10-CM | POA: Diagnosis not present

## 2014-08-22 MED ORDER — IOHEXOL 300 MG/ML  SOLN
30.0000 mL | Freq: Once | INTRAMUSCULAR | Status: AC | PRN
  Administered 2014-08-22: 30 mL via ORAL

## 2014-08-23 ENCOUNTER — Other Ambulatory Visit: Payer: Self-pay | Admitting: Internal Medicine

## 2014-08-29 DIAGNOSIS — K469 Unspecified abdominal hernia without obstruction or gangrene: Secondary | ICD-10-CM | POA: Diagnosis not present

## 2014-09-12 DIAGNOSIS — H3532 Exudative age-related macular degeneration: Secondary | ICD-10-CM | POA: Diagnosis not present

## 2014-09-16 DIAGNOSIS — K429 Umbilical hernia without obstruction or gangrene: Secondary | ICD-10-CM | POA: Diagnosis not present

## 2014-09-16 DIAGNOSIS — R110 Nausea: Secondary | ICD-10-CM | POA: Diagnosis not present

## 2014-09-16 DIAGNOSIS — K4020 Bilateral inguinal hernia, without obstruction or gangrene, not specified as recurrent: Secondary | ICD-10-CM | POA: Diagnosis not present

## 2014-09-16 DIAGNOSIS — M6208 Separation of muscle (nontraumatic), other site: Secondary | ICD-10-CM | POA: Diagnosis not present

## 2014-09-22 ENCOUNTER — Telehealth: Payer: Self-pay | Admitting: Internal Medicine

## 2014-09-24 MED ORDER — ESOMEPRAZOLE MAGNESIUM 40 MG PO CPDR: 40.0000 mg | DELAYED_RELEASE_CAPSULE | Freq: Two times a day (BID) | ORAL | Status: DC

## 2014-09-24 NOTE — Telephone Encounter (Signed)
Refilled nexium for patient to take twice a day as requested.  Lm on vm with this information on vm

## 2014-09-24 NOTE — Telephone Encounter (Signed)
-----   Message from Irene Shipper, MD sent at 09/23/2014  6:39 PM EDT ----- Sure  ----- Message -----    From: Audrea Muscat, CMA    Sent: 09/23/2014   4:35 PM      To: Irene Shipper, MD  Patient has been having some problems with his reflux such that his PCP suggested he take his Nexium twice a day.   Patient states taking it twice a day did, in fact, make a difference.  He wants to know if you will let him have a new prescription for twice a day.  Please advise.

## 2014-10-09 ENCOUNTER — Telehealth: Payer: Self-pay

## 2014-10-09 NOTE — Telephone Encounter (Signed)
Sent prior authorization for Esomeprazole to Cover My Meds.   Awaiting response.

## 2014-10-15 DIAGNOSIS — H35351 Cystoid macular degeneration, right eye: Secondary | ICD-10-CM | POA: Diagnosis not present

## 2014-10-15 DIAGNOSIS — H35371 Puckering of macula, right eye: Secondary | ICD-10-CM | POA: Diagnosis not present

## 2014-10-20 NOTE — Telephone Encounter (Signed)
Patient is going to try supplementing with just one otc Nexium at night to see if this works.  If not, he will bump it to 2.

## 2014-10-20 NOTE — Telephone Encounter (Signed)
Lm that insurance company would not pay for him to take his Nexium twice a day.  I suggested he take one Nexium in the morning and supplement with otc Nexium at night (he would need to take 2 to equal approximately 40mg ).

## 2014-11-04 DIAGNOSIS — H35371 Puckering of macula, right eye: Secondary | ICD-10-CM | POA: Diagnosis not present

## 2014-11-04 DIAGNOSIS — H3532 Exudative age-related macular degeneration: Secondary | ICD-10-CM | POA: Diagnosis not present

## 2014-11-04 DIAGNOSIS — H43813 Vitreous degeneration, bilateral: Secondary | ICD-10-CM | POA: Diagnosis not present

## 2014-11-04 DIAGNOSIS — H3531 Nonexudative age-related macular degeneration: Secondary | ICD-10-CM | POA: Diagnosis not present

## 2014-12-16 DIAGNOSIS — H3532 Exudative age-related macular degeneration: Secondary | ICD-10-CM | POA: Diagnosis not present

## 2014-12-17 DIAGNOSIS — Z8579 Personal history of other malignant neoplasms of lymphoid, hematopoietic and related tissues: Secondary | ICD-10-CM | POA: Diagnosis not present

## 2014-12-17 DIAGNOSIS — E780 Pure hypercholesterolemia: Secondary | ICD-10-CM | POA: Diagnosis not present

## 2014-12-17 DIAGNOSIS — G4700 Insomnia, unspecified: Secondary | ICD-10-CM | POA: Diagnosis not present

## 2014-12-17 DIAGNOSIS — Z23 Encounter for immunization: Secondary | ICD-10-CM | POA: Diagnosis not present

## 2014-12-17 DIAGNOSIS — Z0001 Encounter for general adult medical examination with abnormal findings: Secondary | ICD-10-CM | POA: Diagnosis not present

## 2014-12-17 DIAGNOSIS — Z79899 Other long term (current) drug therapy: Secondary | ICD-10-CM | POA: Diagnosis not present

## 2014-12-19 DIAGNOSIS — Z23 Encounter for immunization: Secondary | ICD-10-CM | POA: Diagnosis not present

## 2014-12-19 DIAGNOSIS — H00011 Hordeolum externum right upper eyelid: Secondary | ICD-10-CM | POA: Diagnosis not present

## 2014-12-19 DIAGNOSIS — K219 Gastro-esophageal reflux disease without esophagitis: Secondary | ICD-10-CM | POA: Diagnosis not present

## 2014-12-19 DIAGNOSIS — Z0001 Encounter for general adult medical examination with abnormal findings: Secondary | ICD-10-CM | POA: Diagnosis not present

## 2014-12-19 DIAGNOSIS — Z79899 Other long term (current) drug therapy: Secondary | ICD-10-CM | POA: Diagnosis not present

## 2014-12-19 DIAGNOSIS — G4700 Insomnia, unspecified: Secondary | ICD-10-CM | POA: Diagnosis not present

## 2014-12-19 DIAGNOSIS — E780 Pure hypercholesterolemia: Secondary | ICD-10-CM | POA: Diagnosis not present

## 2014-12-19 DIAGNOSIS — H2512 Age-related nuclear cataract, left eye: Secondary | ICD-10-CM | POA: Diagnosis not present

## 2014-12-19 DIAGNOSIS — Z8579 Personal history of other malignant neoplasms of lymphoid, hematopoietic and related tissues: Secondary | ICD-10-CM | POA: Diagnosis not present

## 2014-12-27 ENCOUNTER — Ambulatory Visit (INDEPENDENT_AMBULATORY_CARE_PROVIDER_SITE_OTHER): Payer: Medicare Other | Admitting: Emergency Medicine

## 2014-12-27 VITALS — BP 126/60 | HR 57 | Temp 98.2°F | Resp 18 | Ht 69.0 in | Wt 235.1 lb

## 2014-12-27 DIAGNOSIS — H00013 Hordeolum externum right eye, unspecified eyelid: Secondary | ICD-10-CM

## 2014-12-27 NOTE — Patient Instructions (Signed)
Please contact your opthalmologist regarding this sty.  If there is any complication from being seen, please contact us. Please continue to use the warm compresses at your eye as much at least 4 times per day for 15 minutes. Continue to use the ointment. Seek advice from the eye doctor of washing the eyelid with baby shampoo, and using a natural moisturizer along the lid, like olive oil. Sty A sty (hordeolum) is an infection of a gland in the eyelid located at the base of the eyelash. A sty may develop a white or yellow head of pus. It can be puffy (swollen). Usually, the sty will burst and pus will come out on its own. They do not leave lumps in the eyelid once they drain. A sty is often confused with another form of cyst of the eyelid called a chalazion. Chalazions occur within the eyelid and not on the edge where the bases of the eyelashes are. They often are red, sore and then form firm lumps in the eyelid. CAUSES   Germs (bacteria).  Lasting (chronic) eyelid inflammation. SYMPTOMS   Tenderness, redness and swelling along the edge of the eyelid at the base of the eyelashes.  Sometimes, there is a white or yellow head of pus. It may or may not drain. DIAGNOSIS  An ophthalmologist will be able to distinguish between a sty and a chalazion and treat the condition appropriately.  TREATMENT   Styes are typically treated with warm packs (compresses) until drainage occurs.  In rare cases, medicines that kill germs (antibiotics) may be prescribed. These antibiotics may be in the form of drops, cream or pills.  If a hard lump has formed, it is generally necessary to do a small incision and remove the hardened contents of the cyst in a minor surgical procedure done in the office.  In suspicious cases, your caregiver may send the contents of the cyst to the lab to be certain that it is not a rare, but dangerous form of cancer of the glands of the eyelid. HOME CARE INSTRUCTIONS   Wash your hands  often and dry them with a clean towel. Avoid touching your eyelid. This may spread the infection to other parts of the eye.  Apply heat to your eyelid for 10 to 20 minutes, several times a day, to ease pain and help to heal it faster.  Do not squeeze the sty. Allow it to drain on its own. Wash your eyelid carefully 3 to 4 times per day to remove any pus. SEEK IMMEDIATE MEDICAL CARE IF:   Your eye becomes painful or puffy (swollen).  Your vision changes.  Your sty does not drain by itself within 3 days.  Your sty comes back within a short period of time, even with treatment.  You have redness (inflammation) around the eye.  You have a fever. Document Released: 12/29/2004 Document Revised: 06/13/2011 Document Reviewed: 07/05/2013 [facility] Patient Information 2015 [place], [place]. This information is not intended to replace advice given to you by your health care provider. Make sure you discuss any questions you have with your health care provider.

## 2014-12-29 ENCOUNTER — Encounter: Payer: Self-pay | Admitting: Physician Assistant

## 2014-12-29 DIAGNOSIS — H0011 Chalazion right upper eyelid: Secondary | ICD-10-CM | POA: Diagnosis not present

## 2014-12-29 NOTE — Progress Notes (Signed)
[facility] [address] [place] [address] [phone]  Date:  12/27/2014   Name:  Jason [name]   DOB:  05/23/1927   MRN:  [id number]  PCP:   Melinda Crutch, MD    History of Present Illness:  Jason [name] is a 81 y.o. male patient who presents to [facility] for chief complaint of growth on right eye that has been present for more than 2 weeks.  He stated it is bump that has grown and tender to touch.  He has no difficulty with vision.  He has no fever.  He uses warm compresses to the area 4 times per day.  He presented this to his eye doctor initially, he issued him an antibiotic, which he has been using for the last 2 weeks.  He has had styes that have needed to be removed in the past.       Patient Active Problem List   Diagnosis Date Noted  . BPH (benign prostatic hyperplasia) 07/16/2012  . Multiple nodules of lung 09/16/2011  . GERD (gastroesophageal reflux disease) 06/14/2011  . Constipation, chronic 06/14/2011  . NHL (non-Hodgkin's lymphoma) 01/03/2010  . BLADDER CANCER 05/28/2007  . POLYP, COLON 05/28/2007  . HYPERLIPIDEMIA 05/28/2007  . GERD 05/28/2007  . PUD 05/28/2007  . GASTRITIS, CHRONIC 05/28/2007  . HIATAL HERNIA 05/28/2007  . DIVERTICULOSIS OF COLON 05/28/2007  . BARRETT'S ESOPHAGUS, HX OF 05/28/2007  . CHOLECYSTECTOMY, LAPAROSCOPIC, HX OF 05/28/2007    Past Medical History  Diagnosis Date  . Hyperlipidemia   . GERD (gastroesophageal reflux disease)   . Recurrent cold sores   . Personal history of colonic polyps 07/15/2010    tubular adenoma  . Barrett esophagus   . PUD (peptic ulcer disease)   . Hiatal hernia   . Diverticulosis of colon (without mention of hemorrhage)   . Multiple nodules of lung 09/16/2011  . Stomach cancer     NHL origin  . Bladder cancer dx'd [id number]    surg only  . Non Hodgkin's lymphoma dx'd 01/03/2010  . Prostate cancer     patient denies  . Colon polyps     adenomatous    Past Surgical History   Procedure Laterality Date  . Bone spur      left shoulder  . Wrist surgery      Bilateral  . Cholecystectomy    . Appendectomy    . Trigger finger release      bilateral hands  . Tendon repair      right arm  . Laser of prostate w/ green light pvp      Social History  Substance Use Topics  . Smoking status: Former Smoker    Quit date: 10/29/1966  . Smokeless tobacco: Former Systems developer    Quit date: 06/29/1998  . Alcohol Use: No    Family History  Problem Relation Age of Onset  . Prostate cancer Father   . Bone cancer Brother   . Cancer Brother     back    Allergies  Allergen Reactions  . Naprelan [Naproxen Sodium] Other (See Comments)    Medication list has been reviewed and updated.  Current Outpatient Prescriptions on File Prior to Visit  Medication Sig Dispense Refill  . aspirin 81 MG tablet Take 81 mg by mouth daily.      . cephALEXin (KEFLEX) 500 MG capsule Take 1 capsule (500 mg total) by mouth 4 (four) times daily. 28 capsule 0  . clorazepate (  TRANXENE) 3.75 MG tablet Take 3.75 mg by mouth at bedtime as needed for anxiety or sleep.    Marland Kitchen esomeprazole (NEXIUM) 40 MG capsule Take 1 capsule (40 mg total) by mouth daily at 12 noon. 30 capsule 3  . esomeprazole (NEXIUM) 40 MG capsule TAKE 1 CAPSULE BY MOUTH ONCE DAILY AT NOON 30 capsule 0  . esomeprazole (NEXIUM) 40 MG capsule Take 1 capsule (40 mg total) by mouth 2 (two) times daily. 60 capsule 6  . fish oil-omega-3 fatty acids 1000 MG capsule Take 1 g by mouth 3 (three) times daily.      . Multiple Vitamins-Minerals (ICAPS MV PO) Take by mouth 2 (two) times daily.      Vladimir Faster Glycol-Propyl Glycol (SYSTANE ULTRA) 0.4-0.3 % SOLN Apply 1 drop to eye as needed (for dry eyes).    . polyethylene glycol (MIRALAX / GLYCOLAX) packet Take 17 g by mouth daily.     . simvastatin (ZOCOR) 40 MG tablet Take 40 mg by mouth at bedtime.       No current facility-administered medications on file prior to visit.    ROS ROS  otherwise unremarkable unless listed above.   Physical Examination: BP 126/60 mmHg  Pulse 57  Temp(Src) 98.2 F (36.8 C) (Oral)  Resp 18  Ht 5\' 9"  (1.753 m)  Wt 235 lb 2 oz (106.652 kg)  BMI 34.71 kg/m2  SpO2 98% Ideal Body Weight: Weight in (lb) to have BMI = 25: 168.9  Physical Exam  Constitutional: He is oriented to person, place, and time. He appears well-developed and well-nourished. No distress.  HENT:  Head: Normocephalic and atraumatic.  Eyes: Conjunctivae and EOM are normal. Pupils are equal, round, and reactive to light. Left eye exhibits hordeolum (1cm circularly raised growth that is tender.  Eversion difficult due to pain.  No drainage.). Right eye exhibits normal extraocular motion. Left eye exhibits normal extraocular motion.  Dry flaking along eyelids and face.  Blepharitis present with flaking along the lash line.    Cardiovascular: Normal rate.   Pulmonary/Chest: Effort normal. No respiratory distress.  Neurological: He is alert and oriented to person, place, and time.  Skin: Skin is warm and dry. He is not diaphoretic.  Psychiatric: He has a normal mood and affect. His behavior is normal.     Assessment and Plan: 81 year old male is here today for chief complaint of eye pain consistent with hordeolum.   -Advised to contact eye doctor for visit as this will need to be removed by specialist.  He has had multiple.  I have also advised him to discuss eyelid moisturizers and to use baby shampoo along lash line to cleanse this flaking.  This will need to be surgically removed by the eye specialist.   Advised to contact us if he is unable to be seen so we can refer him to a specialist.   Hordeolum, right  Ivar Drape, PA-C [facility] 12/29/2014 12:51 PM

## 2014-12-30 NOTE — Progress Notes (Signed)
  Medical screening examination/treatment/procedure(s) were performed by non-physician practitioner and as supervising physician I was immediately available for consultation/collaboration.     

## 2015-01-26 DIAGNOSIS — H0011 Chalazion right upper eyelid: Secondary | ICD-10-CM | POA: Diagnosis not present

## 2015-01-31 DIAGNOSIS — I499 Cardiac arrhythmia, unspecified: Secondary | ICD-10-CM | POA: Diagnosis not present

## 2015-01-31 DIAGNOSIS — J3089 Other allergic rhinitis: Secondary | ICD-10-CM | POA: Diagnosis not present

## 2015-02-04 DIAGNOSIS — R9431 Abnormal electrocardiogram [ECG] [EKG]: Secondary | ICD-10-CM | POA: Diagnosis not present

## 2015-02-04 DIAGNOSIS — J309 Allergic rhinitis, unspecified: Secondary | ICD-10-CM | POA: Diagnosis not present

## 2015-02-17 DIAGNOSIS — I10 Essential (primary) hypertension: Secondary | ICD-10-CM | POA: Diagnosis not present

## 2015-02-17 DIAGNOSIS — I499 Cardiac arrhythmia, unspecified: Secondary | ICD-10-CM | POA: Diagnosis not present

## 2015-02-17 DIAGNOSIS — R269 Unspecified abnormalities of gait and mobility: Secondary | ICD-10-CM | POA: Diagnosis not present

## 2015-02-17 DIAGNOSIS — H6590 Unspecified nonsuppurative otitis media, unspecified ear: Secondary | ICD-10-CM | POA: Diagnosis not present

## 2015-02-18 DIAGNOSIS — E875 Hyperkalemia: Secondary | ICD-10-CM | POA: Diagnosis not present

## 2015-02-19 DIAGNOSIS — R42 Dizziness and giddiness: Secondary | ICD-10-CM | POA: Diagnosis not present

## 2015-02-19 DIAGNOSIS — R9431 Abnormal electrocardiogram [ECG] [EKG]: Secondary | ICD-10-CM | POA: Diagnosis not present

## 2015-02-19 DIAGNOSIS — E785 Hyperlipidemia, unspecified: Secondary | ICD-10-CM | POA: Diagnosis not present

## 2015-02-19 DIAGNOSIS — E668 Other obesity: Secondary | ICD-10-CM | POA: Diagnosis not present

## 2015-02-20 DIAGNOSIS — R9431 Abnormal electrocardiogram [ECG] [EKG]: Secondary | ICD-10-CM | POA: Diagnosis not present

## 2015-02-20 DIAGNOSIS — E668 Other obesity: Secondary | ICD-10-CM | POA: Diagnosis not present

## 2015-02-20 DIAGNOSIS — E785 Hyperlipidemia, unspecified: Secondary | ICD-10-CM | POA: Diagnosis not present

## 2015-02-20 DIAGNOSIS — R42 Dizziness and giddiness: Secondary | ICD-10-CM | POA: Diagnosis not present

## 2015-02-23 DIAGNOSIS — I5189 Other ill-defined heart diseases: Secondary | ICD-10-CM | POA: Diagnosis not present

## 2015-02-24 DIAGNOSIS — R262 Difficulty in walking, not elsewhere classified: Secondary | ICD-10-CM | POA: Diagnosis not present

## 2015-02-24 DIAGNOSIS — R269 Unspecified abnormalities of gait and mobility: Secondary | ICD-10-CM | POA: Diagnosis not present

## 2015-03-02 DIAGNOSIS — R262 Difficulty in walking, not elsewhere classified: Secondary | ICD-10-CM | POA: Diagnosis not present

## 2015-03-02 DIAGNOSIS — R269 Unspecified abnormalities of gait and mobility: Secondary | ICD-10-CM | POA: Diagnosis not present

## 2015-03-04 DIAGNOSIS — R262 Difficulty in walking, not elsewhere classified: Secondary | ICD-10-CM | POA: Diagnosis not present

## 2015-03-04 DIAGNOSIS — R269 Unspecified abnormalities of gait and mobility: Secondary | ICD-10-CM | POA: Diagnosis not present

## 2015-03-09 DIAGNOSIS — R269 Unspecified abnormalities of gait and mobility: Secondary | ICD-10-CM | POA: Diagnosis not present

## 2015-03-09 DIAGNOSIS — R262 Difficulty in walking, not elsewhere classified: Secondary | ICD-10-CM | POA: Diagnosis not present

## 2015-03-11 DIAGNOSIS — R269 Unspecified abnormalities of gait and mobility: Secondary | ICD-10-CM | POA: Diagnosis not present

## 2015-03-11 DIAGNOSIS — R262 Difficulty in walking, not elsewhere classified: Secondary | ICD-10-CM | POA: Diagnosis not present

## 2015-03-16 DIAGNOSIS — R262 Difficulty in walking, not elsewhere classified: Secondary | ICD-10-CM | POA: Diagnosis not present

## 2015-03-16 DIAGNOSIS — R269 Unspecified abnormalities of gait and mobility: Secondary | ICD-10-CM | POA: Diagnosis not present

## 2015-03-17 DIAGNOSIS — H353221 Exudative age-related macular degeneration, left eye, with active choroidal neovascularization: Secondary | ICD-10-CM | POA: Diagnosis not present

## 2015-03-17 DIAGNOSIS — H353112 Nonexudative age-related macular degeneration, right eye, intermediate dry stage: Secondary | ICD-10-CM | POA: Diagnosis not present

## 2015-03-18 DIAGNOSIS — R269 Unspecified abnormalities of gait and mobility: Secondary | ICD-10-CM | POA: Diagnosis not present

## 2015-03-18 DIAGNOSIS — R262 Difficulty in walking, not elsewhere classified: Secondary | ICD-10-CM | POA: Diagnosis not present

## 2015-03-20 DIAGNOSIS — R42 Dizziness and giddiness: Secondary | ICD-10-CM | POA: Diagnosis not present

## 2015-03-20 DIAGNOSIS — E668 Other obesity: Secondary | ICD-10-CM | POA: Diagnosis not present

## 2015-03-20 DIAGNOSIS — I480 Paroxysmal atrial fibrillation: Secondary | ICD-10-CM | POA: Diagnosis not present

## 2015-03-20 DIAGNOSIS — I5189 Other ill-defined heart diseases: Secondary | ICD-10-CM | POA: Diagnosis not present

## 2015-03-20 DIAGNOSIS — Z7901 Long term (current) use of anticoagulants: Secondary | ICD-10-CM | POA: Diagnosis not present

## 2015-03-20 DIAGNOSIS — R9431 Abnormal electrocardiogram [ECG] [EKG]: Secondary | ICD-10-CM | POA: Diagnosis not present

## 2015-03-20 DIAGNOSIS — E785 Hyperlipidemia, unspecified: Secondary | ICD-10-CM | POA: Diagnosis not present

## 2015-03-23 DIAGNOSIS — R269 Unspecified abnormalities of gait and mobility: Secondary | ICD-10-CM | POA: Diagnosis not present

## 2015-03-23 DIAGNOSIS — R262 Difficulty in walking, not elsewhere classified: Secondary | ICD-10-CM | POA: Diagnosis not present

## 2015-03-25 DIAGNOSIS — R262 Difficulty in walking, not elsewhere classified: Secondary | ICD-10-CM | POA: Diagnosis not present

## 2015-03-25 DIAGNOSIS — R269 Unspecified abnormalities of gait and mobility: Secondary | ICD-10-CM | POA: Diagnosis not present

## 2015-04-14 DIAGNOSIS — H353221 Exudative age-related macular degeneration, left eye, with active choroidal neovascularization: Secondary | ICD-10-CM | POA: Diagnosis not present

## 2015-04-23 DIAGNOSIS — M25562 Pain in left knee: Secondary | ICD-10-CM | POA: Diagnosis not present

## 2015-04-30 DIAGNOSIS — R600 Localized edema: Secondary | ICD-10-CM | POA: Diagnosis not present

## 2015-04-30 DIAGNOSIS — M25562 Pain in left knee: Secondary | ICD-10-CM | POA: Diagnosis not present

## 2015-04-30 DIAGNOSIS — M79605 Pain in left leg: Secondary | ICD-10-CM | POA: Diagnosis not present

## 2015-05-05 DIAGNOSIS — M2392 Unspecified internal derangement of left knee: Secondary | ICD-10-CM | POA: Diagnosis not present

## 2015-05-05 DIAGNOSIS — M25572 Pain in left ankle and joints of left foot: Secondary | ICD-10-CM | POA: Diagnosis not present

## 2015-05-05 DIAGNOSIS — M25562 Pain in left knee: Secondary | ICD-10-CM | POA: Diagnosis not present

## 2015-05-05 DIAGNOSIS — M1712 Unilateral primary osteoarthritis, left knee: Secondary | ICD-10-CM | POA: Diagnosis not present

## 2015-05-12 NOTE — Telephone Encounter (Signed)
Patient stated that he is taking Nexium in the morning and  2 OTC Nexium at night. He is concerned about the expense but while talking to me decided he was going to try to take just one otc nexium at night and see if that will work as well.  I told him to call me if it didn't and we needed to change something else.  Patient agreed.

## 2015-05-19 DIAGNOSIS — M25572 Pain in left ankle and joints of left foot: Secondary | ICD-10-CM | POA: Diagnosis not present

## 2015-05-19 DIAGNOSIS — H353221 Exudative age-related macular degeneration, left eye, with active choroidal neovascularization: Secondary | ICD-10-CM | POA: Diagnosis not present

## 2015-05-19 DIAGNOSIS — M2242 Chondromalacia patellae, left knee: Secondary | ICD-10-CM | POA: Diagnosis not present

## 2015-05-19 DIAGNOSIS — M1712 Unilateral primary osteoarthritis, left knee: Secondary | ICD-10-CM | POA: Diagnosis not present

## 2015-05-19 DIAGNOSIS — M7122 Synovial cyst of popliteal space [Baker], left knee: Secondary | ICD-10-CM | POA: Diagnosis not present

## 2015-05-22 DIAGNOSIS — H43812 Vitreous degeneration, left eye: Secondary | ICD-10-CM | POA: Diagnosis not present

## 2015-05-22 DIAGNOSIS — H5310 Unspecified subjective visual disturbances: Secondary | ICD-10-CM | POA: Diagnosis not present

## 2015-05-25 DIAGNOSIS — M1712 Unilateral primary osteoarthritis, left knee: Secondary | ICD-10-CM | POA: Diagnosis not present

## 2015-05-25 DIAGNOSIS — M2242 Chondromalacia patellae, left knee: Secondary | ICD-10-CM | POA: Diagnosis not present

## 2015-05-25 DIAGNOSIS — M25562 Pain in left knee: Secondary | ICD-10-CM | POA: Diagnosis not present

## 2015-05-25 DIAGNOSIS — M2392 Unspecified internal derangement of left knee: Secondary | ICD-10-CM | POA: Diagnosis not present

## 2015-06-01 DIAGNOSIS — M1712 Unilateral primary osteoarthritis, left knee: Secondary | ICD-10-CM | POA: Diagnosis not present

## 2015-06-05 DIAGNOSIS — M2242 Chondromalacia patellae, left knee: Secondary | ICD-10-CM | POA: Diagnosis not present

## 2015-06-05 DIAGNOSIS — M1712 Unilateral primary osteoarthritis, left knee: Secondary | ICD-10-CM | POA: Diagnosis not present

## 2015-06-05 DIAGNOSIS — M2392 Unspecified internal derangement of left knee: Secondary | ICD-10-CM | POA: Diagnosis not present

## 2015-06-05 DIAGNOSIS — M25562 Pain in left knee: Secondary | ICD-10-CM | POA: Diagnosis not present

## 2015-06-09 DIAGNOSIS — Z01810 Encounter for preprocedural cardiovascular examination: Secondary | ICD-10-CM | POA: Diagnosis not present

## 2015-06-09 DIAGNOSIS — Z7901 Long term (current) use of anticoagulants: Secondary | ICD-10-CM | POA: Diagnosis not present

## 2015-06-09 DIAGNOSIS — E668 Other obesity: Secondary | ICD-10-CM | POA: Diagnosis not present

## 2015-06-09 DIAGNOSIS — E785 Hyperlipidemia, unspecified: Secondary | ICD-10-CM | POA: Diagnosis not present

## 2015-06-09 DIAGNOSIS — R9431 Abnormal electrocardiogram [ECG] [EKG]: Secondary | ICD-10-CM | POA: Diagnosis not present

## 2015-06-09 DIAGNOSIS — I480 Paroxysmal atrial fibrillation: Secondary | ICD-10-CM | POA: Diagnosis not present

## 2015-06-18 DIAGNOSIS — K219 Gastro-esophageal reflux disease without esophagitis: Secondary | ICD-10-CM | POA: Diagnosis not present

## 2015-06-18 DIAGNOSIS — I10 Essential (primary) hypertension: Secondary | ICD-10-CM | POA: Diagnosis not present

## 2015-06-19 ENCOUNTER — Encounter: Payer: Self-pay | Admitting: Internal Medicine

## 2015-06-19 DIAGNOSIS — M1712 Unilateral primary osteoarthritis, left knee: Secondary | ICD-10-CM | POA: Diagnosis not present

## 2015-06-19 DIAGNOSIS — M2242 Chondromalacia patellae, left knee: Secondary | ICD-10-CM | POA: Diagnosis not present

## 2015-06-19 DIAGNOSIS — M25562 Pain in left knee: Secondary | ICD-10-CM | POA: Diagnosis not present

## 2015-06-19 DIAGNOSIS — M7122 Synovial cyst of popliteal space [Baker], left knee: Secondary | ICD-10-CM | POA: Diagnosis not present

## 2015-06-24 ENCOUNTER — Ambulatory Visit: Payer: Self-pay | Admitting: Orthopedic Surgery

## 2015-06-25 DIAGNOSIS — M1712 Unilateral primary osteoarthritis, left knee: Secondary | ICD-10-CM | POA: Diagnosis not present

## 2015-06-30 DIAGNOSIS — H353221 Exudative age-related macular degeneration, left eye, with active choroidal neovascularization: Secondary | ICD-10-CM | POA: Diagnosis not present

## 2015-07-15 ENCOUNTER — Ambulatory Visit: Payer: Self-pay | Admitting: Orthopedic Surgery

## 2015-07-15 NOTE — H&P (Signed)
TOTAL KNEE ADMISSION H&P  Patient is being admitted for left total knee arthroplasty.  Subjective:  Chief Complaint:left knee pain.  HPI: Jason [name], 82 y.o. male, has a history of pain and functional disability in the left knee due to arthritis and large tibial subcortical stress fracture and has failed non-surgical conservative treatments for greater than 12 weeks to includeNSAID's and/or analgesics, corticosteriod injections, flexibility and strengthening excercises, supervised PT with diminished ADL's post treatment, use of assistive devices, weight reduction as appropriate and activity modification.  Onset of symptoms was abrupt, starting 1 years ago with rapidlly worsening course since that time. The patient noted no past surgery on the left knee(s).  Patient currently rates pain in the left knee(s) at 10 out of 10 with activity. Patient has night pain, worsening of pain with activity and weight bearing, pain that interferes with activities of daily living, pain with passive range of motion and joint swelling.  Patient has evidence of subchondral cysts, subchondral sclerosis and joint space narrowing by imaging studies.There is no active infection.  Patient Active Problem List   Diagnosis Date Noted  . BPH (benign prostatic hyperplasia) 07/16/2012  . Multiple nodules of lung 09/16/2011  . GERD (gastroesophageal reflux disease) 06/14/2011  . Constipation, chronic 06/14/2011  . NHL (non-Hodgkin's lymphoma) ([place]) 01/03/2010  . BLADDER CANCER 05/28/2007  . HYPERLIPIDEMIA 05/28/2007  . HIATAL HERNIA 05/28/2007  . BARRETT'S ESOPHAGUS, HX OF 05/28/2007   Past Medical History  Diagnosis Date  . Hyperlipidemia   . GERD (gastroesophageal reflux disease)   . Recurrent cold sores   . Personal history of colonic polyps 07/15/2010    tubular adenoma  . Barrett esophagus   . PUD (peptic ulcer disease)   . Hiatal hernia   . Diverticulosis of colon (without mention of hemorrhage)   .  Multiple nodules of lung 09/16/2011  . Stomach cancer     NHL origin  . Bladder cancer dx'd [id number]    surg only  . Non Hodgkin's lymphoma dx'd 01/03/2010  . Prostate cancer     patient denies  . Colon polyps     adenomatous    Past Surgical History  Procedure Laterality Date  . Bone spur      left shoulder  . Wrist surgery      Bilateral  . Cholecystectomy    . Appendectomy    . Trigger finger release      bilateral hands  . Tendon repair      right arm  . Laser of prostate w/ green light pvp       (Not in a hospital admission) Allergies  Allergen Reactions  . Naprelan [Naproxen Sodium] Other (See Comments)    Social History  Substance Use Topics  . Smoking status: Former Smoker    Quit date: 10/29/1966  . Smokeless tobacco: Former Systems developer    Quit date: 06/29/1998  . Alcohol Use: No    Family History  Problem Relation Age of Onset  . Prostate cancer Father   . Bone cancer Brother   . Cancer Brother     back     Review of Systems  Constitutional: Negative.   HENT: Negative.   Eyes: Negative.   Respiratory: Negative.   Cardiovascular: Negative.   Gastrointestinal: Positive for constipation. Negative for heartburn, nausea, vomiting, abdominal pain, diarrhea, blood in stool and melena.  Genitourinary: Negative.   Musculoskeletal: Positive for myalgias, back pain, joint pain and falls.  Skin: Negative.   Neurological: Negative.  Endo/Heme/Allergies: Negative.   Psychiatric/Behavioral: Negative.     Objective:  Physical Exam  Constitutional: He is oriented to person, place, and time. He appears well-developed and well-nourished.  HENT:  Head: Normocephalic and atraumatic.  Eyes: Conjunctivae and EOM are normal. Pupils are equal, round, and reactive to light.  Neck: Normal range of motion. Neck supple.  Cardiovascular: Normal rate and normal heart sounds.   Respiratory: Effort normal and breath sounds normal.  GI: Soft. Bowel sounds are normal. He  exhibits no distension.  Genitourinary:  deferred  Musculoskeletal:       Left knee: He exhibits decreased range of motion, swelling and bony tenderness. Tenderness found. Medial joint line and lateral joint line tenderness noted.  Neurological: He is alert and oriented to person, place, and time. He has normal reflexes.  Skin: Skin is dry.  Psychiatric: He has a normal mood and affect. His behavior is normal. Judgment and thought content normal.    Vital signs in last 24 hours: @VSRANGES @  Labs:   Estimated body mass index is 34.71 kg/(m^2) as calculated from the following:   Height as of 12/27/14: 5\' 9"  (1.753 m).   Weight as of 12/27/14: 106.652 kg (235 lb 2 oz).   Imaging Review Plain radiographs demonstrate severe degenerative joint disease of the left knee(s). The overall alignment ismild varus. The bone quality appears to be adequate for age and reported activity level.  Assessment/Plan:  End stage arthritis, left knee with extensive tibial subcortical fracture  The patient history, physical examination, clinical judgment of the provider and imaging studies are consistent with end stage degenerative joint disease of the left knee(s) and total knee arthroplasty is deemed medically necessary. The treatment options including medical management, injection therapy arthroscopy and arthroplasty were discussed at length. The risks and benefits of total knee arthroplasty were presented and reviewed. The risks due to aseptic loosening, infection, stiffness, patella tracking problems, thromboembolic complications and other imponderables were discussed. The patient acknowledged the explanation, agreed to proceed with the plan and consent was signed. Patient is being admitted for inpatient treatment for surgery, pain control, PT, OT, prophylactic antibiotics, VTE prophylaxis, progressive ambulation and ADL's and discharge planning. The patient is planning to be discharged home with home health  services. D/C eliquis 3 days postop per Dr. Wynonia Lawman

## 2015-07-20 NOTE — Patient Instructions (Addendum)
YOUR PROCEDURE IS SCHEDULED ON :  07/30/15  REPORT TO [facility] MAIN ENTRANCE FOLLOW SIGNS TO EAST ELEVATOR - GO TO 3rd FLOOR CHECK IN AT 3 EAST NURSES STATION (SHORT STAY) AT:  10:45 AM  CALL THIS NUMBER IF YOU HAVE PROBLEMS THE MORNING OF SURGERY [phone]  REMEMBER:ONLY 1 PER PERSON MAY GO TO SHORT STAY WITH YOU TO GET READY THE MORNING OF YOUR SURGERY  DO NOT EAT FOOD OR DRINK LIQUIDS AFTER MIDNIGHT  TAKE THESE MEDICINES THE MORNING OF SURGERY: NEXIUM  YOU MAY NOT HAVE ANY METAL ON YOUR BODY INCLUDING HAIR PINS AND PIERCING'S. DO NOT WEAR JEWELRY, MAKEUP, LOTIONS, POWDERS OR PERFUMES. DO NOT WEAR NAIL POLISH. DO NOT SHAVE 48 HRS PRIOR TO SURGERY. MEN MAY SHAVE FACE AND NECK.  DO NOT [facility] IS NOT RESPONSIBLE FOR VALUABLES.  CONTACTS, DENTURES OR PARTIALS MAY NOT BE WORN TO SURGERY. LEAVE SUITCASE IN CAR. CAN BE BROUGHT TO ROOM AFTER SURGERY.  PATIENTS DISCHARGED THE DAY OF SURGERY WILL NOT BE ALLOWED TO DRIVE HOME.  PLEASE READ OVER THE FOLLOWING INSTRUCTION SHEETS _________________________________________________________________________________                                          [facility] - PREPARING FOR SURGERY  Before surgery, you can play an important role.  Because skin is not sterile, your skin needs to be as free of germs as possible.  You can reduce the number of germs on your skin by washing with CHG (chlorahexidine gluconate) soap before surgery.  CHG is an antiseptic cleaner which kills germs and bonds with the skin to continue killing germs even after washing. Please DO NOT use if you have an allergy to CHG or antibacterial soaps.  If your skin becomes reddened/irritated stop using the CHG and inform your nurse when you arrive at Short Stay. Do not shave (including legs and underarms) for at least 48 hours prior to the first CHG shower.  You may shave your face. Please follow these instructions  carefully:   1.  Shower with CHG Soap the night before surgery and the  morning of Surgery.   2.  If you choose to wash your hair, wash your hair first as usual with your  normal  Shampoo.   3.  After you shampoo, rinse your hair and body thoroughly to remove the  shampoo.                                         4.  Use CHG as you would any other liquid soap.  You can apply chg directly  to the skin and wash . Gently wash with scrungie or clean wascloth    5.  Apply the CHG Soap to your body ONLY FROM THE NECK DOWN.   Do not use on open                           Wound or open sores. Avoid contact with eyes, ears mouth and genitals (private parts).                        Genitals (private parts) with your normal soap.  6.  Wash thoroughly, paying special attention to the area where your surgery  will be performed.   7.  Thoroughly rinse your body with warm water from the neck down.   8.  DO NOT shower/wash with your normal soap after using and rinsing off  the CHG Soap .                9.  Pat yourself dry with a clean towel.             10.  Wear clean night clothes to bed after shower             11.  Place clean sheets on your bed the night of your first shower and do not  sleep with pets.  Day of Surgery : Do not apply any lotions/deodorants the morning of surgery.  Please wear clean clothes to the hospital/surgery center.  FAILURE TO FOLLOW THESE INSTRUCTIONS MAY RESULT IN THE CANCELLATION OF YOUR SURGERY    PATIENT SIGNATURE_________________________________  ______________________________________________________________________     Jason [name]  An incentive spirometer is a tool that can help keep your lungs clear and active. This tool measures how well you are filling your lungs with each breath. Taking long deep breaths may help reverse or decrease the chance of developing breathing (pulmonary) problems (especially infection) following:  A long  period of time when you are unable to move or be active. BEFORE THE PROCEDURE   If the spirometer includes an indicator to show your best effort, your nurse or respiratory therapist will set it to a desired goal.  If possible, sit up straight or lean slightly forward. Try not to slouch.  Hold the incentive spirometer in an upright position. INSTRUCTIONS FOR USE   Sit on the edge of your bed if possible, or sit up as far as you can in bed or on a chair.  Hold the incentive spirometer in an upright position.  Breathe out normally.  Place the mouthpiece in your mouth and seal your lips tightly around it.  Breathe in slowly and as deeply as possible, raising the piston or the ball toward the top of the column.  Hold your breath for 3-5 seconds or for as long as possible. Allow the piston or ball to fall to the bottom of the column.  Remove the mouthpiece from your mouth and breathe out normally.  Rest for a few seconds and repeat Steps 1 through 7 at least 10 times every 1-2 hours when you are awake. Take your time and take a few normal breaths between deep breaths.  The spirometer may include an indicator to show your best effort. Use the indicator as a goal to work toward during each repetition.  After each set of 10 deep breaths, practice coughing to be sure your lungs are clear. If you have an incision (the cut made at the time of surgery), support your incision when coughing by placing a pillow or rolled up towels firmly against it. Once you are able to get out of bed, walk around indoors and cough well. You may stop using the incentive spirometer when instructed by your caregiver.  RISKS AND COMPLICATIONS  Take your time so you do not get dizzy or light-headed.  If you are in pain, you may need to take or ask for pain medication before doing incentive spirometry. It is harder to take a deep breath if you are having pain. AFTER USE  Rest and breathe slowly and easily.  It can  be helpful to keep track of a log of your progress. Your caregiver can provide you with a simple table to help with this. If you are using the spirometer at home, follow these instructions: Jason [name] IF:   You are having difficultly using the spirometer.  You have trouble using the spirometer as often as instructed.  Your pain medication is not giving enough relief while using the spirometer.  You develop fever of 100.5 F (38.1 C) or higher. SEEK IMMEDIATE MEDICAL CARE IF:   You cough up bloody sputum that had not been present before.  You develop fever of 102 F (38.9 C) or greater.  You develop worsening pain at or near the incision site. MAKE SURE YOU:   Understand these instructions.  Will watch your condition.  Will get help right away if you are not doing well or get worse. Document Released: 08/01/2006 Document Revised: 06/13/2011 Document Reviewed: 10/02/2006 [employer] Patient Information 2014 [employer], [place].   ________________________________________________________________________  WHAT IS A BLOOD TRANSFUSION? Blood Transfusion Information  A transfusion is the replacement of blood or some of its parts. Blood is made up of multiple cells which provide different functions.  Red blood cells carry oxygen and are used for blood loss replacement.  White blood cells fight against infection.  Platelets control bleeding.  Plasma helps clot blood.  Other blood products are available for specialized needs, such as hemophilia or other clotting disorders. BEFORE THE TRANSFUSION  Who gives blood for transfusions?   Healthy volunteers who are fully evaluated to make sure their blood is safe. This is blood bank blood. Transfusion therapy is the safest it has ever been in the practice of medicine. Before blood is taken from a donor, a complete history is taken to make sure that person has no history of diseases nor engages in risky social behavior (examples are  intravenous drug use or sexual activity with multiple partners). The donor's travel history is screened to minimize risk of transmitting infections, such as malaria. The donated blood is tested for signs of infectious diseases, such as HIV and hepatitis. The blood is then tested to be sure it is compatible with you in order to minimize the chance of a transfusion reaction. If you or a relative donates blood, this is often done in anticipation of surgery and is not appropriate for emergency situations. It takes many days to process the donated blood. RISKS AND COMPLICATIONS Although transfusion therapy is very safe and saves many lives, the main dangers of transfusion include:   Getting an infectious disease.  Developing a transfusion reaction. This is an allergic reaction to something in the blood you were given. Every precaution is taken to prevent this. The decision to have a blood transfusion has been considered carefully by your caregiver before blood is given. Blood is not given unless the benefits outweigh the risks. AFTER THE TRANSFUSION  Right after receiving a blood transfusion, you will usually feel much better and more energetic. This is especially true if your red blood cells have gotten low (anemic). The transfusion raises the level of the red blood cells which carry oxygen, and this usually causes an energy increase.  The nurse administering the transfusion will monitor you carefully for complications. HOME CARE INSTRUCTIONS  No special instructions are needed after a transfusion. You may find your energy is better. Speak with your caregiver about any limitations on activity for underlying diseases you may have. SEEK MEDICAL CARE IF:   Your  condition is not improving after your transfusion.  You develop redness or irritation at the intravenous (IV) site. SEEK IMMEDIATE MEDICAL CARE IF:  Any of the following symptoms occur over the next 12 hours:  Shaking chills.  You have a  temperature by mouth above 102 F (38.9 C), not controlled by medicine.  Chest, back, or muscle pain.  People around you feel you are not acting correctly or are confused.  Shortness of breath or difficulty breathing.  Dizziness and fainting.  You get a rash or develop hives.  You have a decrease in urine output.  Your urine turns a dark color or changes to pink, red, or brown. Any of the following symptoms occur over the next 10 days:  You have a temperature by mouth above 102 F (38.9 C), not controlled by medicine.  Shortness of breath.  Weakness after normal activity.  The white part of the eye turns yellow (jaundice).  You have a decrease in the amount of urine or are urinating less often.  Your urine turns a dark color or changes to pink, red, or brown. Document Released: 03/18/2000 Document Revised: 06/13/2011 Document Reviewed: 11/05/2007 [facility] Patient Information 2014 [place], [place].  _______________________________________________________________________

## 2015-07-21 ENCOUNTER — Encounter (HOSPITAL_COMMUNITY): Payer: Self-pay

## 2015-07-21 ENCOUNTER — Encounter (HOSPITAL_COMMUNITY)
Admission: RE | Admit: 2015-07-21 | Discharge: 2015-07-21 | Disposition: A | Discharge status: Home / Self Care | Payer: Medicare Other | Source: Ambulatory Visit | Attending: Orthopedic Surgery | Admitting: Orthopedic Surgery

## 2015-07-21 DIAGNOSIS — Z01812 Encounter for preprocedural laboratory examination: Secondary | ICD-10-CM | POA: Insufficient documentation

## 2015-07-21 DIAGNOSIS — M1712 Unilateral primary osteoarthritis, left knee: Secondary | ICD-10-CM | POA: Diagnosis not present

## 2015-07-21 HISTORY — DX: Frequency of micturition: R35.0

## 2015-07-21 HISTORY — DX: Unspecified macular degeneration: H35.30

## 2015-07-21 HISTORY — DX: Non-Hodgkin lymphoma, unspecified, unspecified site: C85.90

## 2015-07-21 HISTORY — DX: Sleep disorder, unspecified: G47.9

## 2015-07-21 HISTORY — DX: Personal history of malignant neoplasm of bladder: Z85.51

## 2015-07-21 HISTORY — DX: Unspecified osteoarthritis, unspecified site: M19.90

## 2015-07-21 HISTORY — DX: Personal history of other malignant neoplasm of skin: Z85.828

## 2015-07-21 LAB — CBC
HCT: 40.3 % (ref 39.0–52.0)
HEMOGLOBIN: 13.2 g/dL (ref 13.0–17.0)
MCH: 28.4 pg (ref 26.0–34.0)
MCHC: 32.8 g/dL (ref 30.0–36.0)
MCV: 86.7 fL (ref 78.0–100.0)
Platelets: 166 10*3/uL (ref 150–400)
RBC: 4.65 MIL/uL (ref 4.22–5.81)
RDW: 13 % (ref 11.5–15.5)
WBC: 10 10*3/uL (ref 4.0–10.5)

## 2015-07-21 LAB — SURGICAL PCR SCREEN
MRSA, PCR: NEGATIVE
Staphylococcus aureus: NEGATIVE

## 2015-07-21 LAB — BASIC METABOLIC PANEL
ANION GAP: 5 (ref 5–15)
BUN: 31 mg/dL — AB (ref 6–20)
CHLORIDE: 111 mmol/L (ref 101–111)
CO2: 26 mmol/L (ref 22–32)
Calcium: 9.4 mg/dL (ref 8.9–10.3)
Creatinine, Ser: 1.07 mg/dL (ref 0.61–1.24)
GFR calc non Af Amer: >60 mL/min (ref >60)
Glucose, Bld: 96 mg/dL (ref 65–99)
POTASSIUM: 4.7 mmol/L (ref 3.5–5.1)
SODIUM: 142 mmol/L (ref 135–145)

## 2015-07-21 LAB — ABO/RH: ABO/RH(D): A POS

## 2015-07-21 LAB — PROTIME-INR
INR: 1.52 — ABNORMAL HIGH (ref 0.00–1.49)
PROTHROMBIN TIME: 17.8 s — AB (ref 11.6–15.2)

## 2015-07-28 DIAGNOSIS — H353221 Exudative age-related macular degeneration, left eye, with active choroidal neovascularization: Secondary | ICD-10-CM | POA: Diagnosis not present

## 2015-07-30 ENCOUNTER — Inpatient Hospital Stay (HOSPITAL_COMMUNITY): Payer: Medicare Other

## 2015-07-30 ENCOUNTER — Inpatient Hospital Stay (HOSPITAL_COMMUNITY)
Admission: RE | Admit: 2015-07-30 | Discharge: 2015-08-01 | DRG: 470 | Disposition: A | Discharge status: Home Health | Payer: Medicare Other | Source: Ambulatory Visit | Attending: Orthopedic Surgery | Admitting: Orthopedic Surgery

## 2015-07-30 ENCOUNTER — Encounter (HOSPITAL_COMMUNITY)
Admission: RE | Disposition: A | Discharge status: Home Health | Payer: Self-pay | Source: Ambulatory Visit | Attending: Orthopedic Surgery

## 2015-07-30 ENCOUNTER — Inpatient Hospital Stay (HOSPITAL_COMMUNITY): Payer: Medicare Other | Admitting: Anesthesiology

## 2015-07-30 ENCOUNTER — Encounter (HOSPITAL_COMMUNITY): Payer: Self-pay | Admitting: *Deleted

## 2015-07-30 DIAGNOSIS — Z888 Allergy status to other drugs, medicaments and biological substances status: Secondary | ICD-10-CM

## 2015-07-30 DIAGNOSIS — Z85028 Personal history of other malignant neoplasm of stomach: Secondary | ICD-10-CM

## 2015-07-30 DIAGNOSIS — Z85828 Personal history of other malignant neoplasm of skin: Secondary | ICD-10-CM

## 2015-07-30 DIAGNOSIS — K2270 Barrett's esophagus without dysplasia: Secondary | ICD-10-CM | POA: Diagnosis not present

## 2015-07-30 DIAGNOSIS — K219 Gastro-esophageal reflux disease without esophagitis: Secondary | ICD-10-CM | POA: Diagnosis present

## 2015-07-30 DIAGNOSIS — Z6834 Body mass index (BMI) 34.0-34.9, adult: Secondary | ICD-10-CM

## 2015-07-30 DIAGNOSIS — Z808 Family history of malignant neoplasm of other organs or systems: Secondary | ICD-10-CM | POA: Diagnosis not present

## 2015-07-30 DIAGNOSIS — Z8572 Personal history of non-Hodgkin lymphomas: Secondary | ICD-10-CM

## 2015-07-30 DIAGNOSIS — M1712 Unilateral primary osteoarthritis, left knee: Principal | ICD-10-CM | POA: Diagnosis present

## 2015-07-30 DIAGNOSIS — H3530 Unspecified macular degeneration: Secondary | ICD-10-CM | POA: Diagnosis present

## 2015-07-30 DIAGNOSIS — Z471 Aftercare following joint replacement surgery: Secondary | ICD-10-CM | POA: Diagnosis not present

## 2015-07-30 DIAGNOSIS — Z96652 Presence of left artificial knee joint: Secondary | ICD-10-CM | POA: Diagnosis not present

## 2015-07-30 DIAGNOSIS — K449 Diaphragmatic hernia without obstruction or gangrene: Secondary | ICD-10-CM | POA: Diagnosis present

## 2015-07-30 DIAGNOSIS — Z9049 Acquired absence of other specified parts of digestive tract: Secondary | ICD-10-CM | POA: Diagnosis not present

## 2015-07-30 DIAGNOSIS — Z8551 Personal history of malignant neoplasm of bladder: Secondary | ICD-10-CM | POA: Diagnosis not present

## 2015-07-30 DIAGNOSIS — Z86010 Personal history of colonic polyps: Secondary | ICD-10-CM

## 2015-07-30 DIAGNOSIS — E785 Hyperlipidemia, unspecified: Secondary | ICD-10-CM | POA: Diagnosis not present

## 2015-07-30 DIAGNOSIS — N400 Enlarged prostate without lower urinary tract symptoms: Secondary | ICD-10-CM | POA: Diagnosis present

## 2015-07-30 DIAGNOSIS — Z8711 Personal history of peptic ulcer disease: Secondary | ICD-10-CM | POA: Diagnosis not present

## 2015-07-30 DIAGNOSIS — Z87891 Personal history of nicotine dependence: Secondary | ICD-10-CM

## 2015-07-30 DIAGNOSIS — S82192G Other fracture of upper end of left tibia, subsequent encounter for closed fracture with delayed healing: Secondary | ICD-10-CM | POA: Diagnosis not present

## 2015-07-30 DIAGNOSIS — Z09 Encounter for follow-up examination after completed treatment for conditions other than malignant neoplasm: Secondary | ICD-10-CM

## 2015-07-30 DIAGNOSIS — Z8042 Family history of malignant neoplasm of prostate: Secondary | ICD-10-CM | POA: Diagnosis not present

## 2015-07-30 DIAGNOSIS — Z8546 Personal history of malignant neoplasm of prostate: Secondary | ICD-10-CM | POA: Diagnosis not present

## 2015-07-30 DIAGNOSIS — M179 Osteoarthritis of knee, unspecified: Secondary | ICD-10-CM | POA: Diagnosis not present

## 2015-07-30 HISTORY — PX: KNEE ARTHROPLASTY: SHX992

## 2015-07-30 LAB — TYPE AND SCREEN
ABO/RH(D): A POS
Antibody Screen: NEGATIVE

## 2015-07-30 LAB — PROTIME-INR
INR: 1.27 (ref 0.00–1.49)
Prothrombin Time: 16.1 seconds — ABNORMAL HIGH (ref 11.6–15.2)

## 2015-07-30 SURGERY — ARTHROPLASTY, KNEE, TOTAL, USING IMAGELESS COMPUTER-ASSISTED NAVIGATION
Anesthesia: Spinal | Site: Knee | Laterality: Left

## 2015-07-30 MED ORDER — SODIUM CHLORIDE 0.9 % IV SOLN: INTRAVENOUS | Status: DC

## 2015-07-30 MED ORDER — TRANEXAMIC ACID 1000 MG/10ML IV SOLN
1000.0000 mg | INTRAVENOUS | Status: DC
  Filled 2015-07-30: qty 10

## 2015-07-30 MED ORDER — CEFAZOLIN SODIUM-DEXTROSE 2-4 GM/100ML-% IV SOLN
2.0000 g | INTRAVENOUS | Status: AC
  Administered 2015-07-30: 2 g via INTRAVENOUS
  Filled 2015-07-30: qty 100

## 2015-07-30 MED ORDER — ONDANSETRON HCL 4 MG/2ML IJ SOLN: 4.0000 mg | Freq: Four times a day (QID) | INTRAMUSCULAR | Status: DC | PRN

## 2015-07-30 MED ORDER — SODIUM CHLORIDE 0.9 % IJ SOLN
INTRAMUSCULAR | Status: AC
  Filled 2015-07-30: qty 50

## 2015-07-30 MED ORDER — SODIUM CHLORIDE 0.9 % IR SOLN
Status: DC | PRN
  Administered 2015-07-30: 1000 mL

## 2015-07-30 MED ORDER — METHOCARBAMOL 1000 MG/10ML IJ SOLN
500.0000 mg | Freq: Four times a day (QID) | INTRAVENOUS | Status: DC | PRN
  Administered 2015-07-30: 500 mg via INTRAVENOUS
  Filled 2015-07-30 (×2): qty 5

## 2015-07-30 MED ORDER — SODIUM CHLORIDE 0.9 % IR SOLN
Status: DC | PRN
  Administered 2015-07-30: 3000 mL

## 2015-07-30 MED ORDER — DOCUSATE SODIUM 100 MG PO CAPS
100.0000 mg | ORAL_CAPSULE | Freq: Two times a day (BID) | ORAL | Status: DC
Start: 2015-07-30 — End: 2015-08-01
  Administered 2015-07-30 – 2015-08-01 (×4): 100 mg via ORAL
  Filled 2015-07-30 (×8): qty 1

## 2015-07-30 MED ORDER — KETOROLAC TROMETHAMINE 30 MG/ML IJ SOLN
INTRAMUSCULAR | Status: AC
  Filled 2015-07-30: qty 1

## 2015-07-30 MED ORDER — EPHEDRINE SULFATE 50 MG/ML IJ SOLN
INTRAMUSCULAR | Status: DC | PRN
  Administered 2015-07-30: 10 mg via INTRAVENOUS
  Administered 2015-07-30: 15 mg via INTRAVENOUS
  Administered 2015-07-30 (×2): 10 mg via INTRAVENOUS

## 2015-07-30 MED ORDER — CEFAZOLIN SODIUM-DEXTROSE 2-4 GM/100ML-% IV SOLN
INTRAVENOUS | Status: AC
  Filled 2015-07-30: qty 100

## 2015-07-30 MED ORDER — METOCLOPRAMIDE HCL 5 MG/ML IJ SOLN: 5.0000 mg | Freq: Three times a day (TID) | INTRAMUSCULAR | Status: DC | PRN

## 2015-07-30 MED ORDER — MAGNESIUM CITRATE PO SOLN
1.0000 | Freq: Once | ORAL | Status: DC | PRN
Start: 2015-07-30 — End: 2015-08-01

## 2015-07-30 MED ORDER — FENTANYL CITRATE (PF) 100 MCG/2ML IJ SOLN: 25.0000 ug | INTRAMUSCULAR | Status: DC | PRN

## 2015-07-30 MED ORDER — PROPOFOL 10 MG/ML IV BOLUS
INTRAVENOUS | Status: AC
  Filled 2015-07-30: qty 40

## 2015-07-30 MED ORDER — APIXABAN 5 MG PO TABS: 5.0000 mg | ORAL_TABLET | Freq: Two times a day (BID) | ORAL | Status: DC

## 2015-07-30 MED ORDER — ALUM & MAG HYDROXIDE-SIMETH 200-200-20 MG/5ML PO SUSP: 30.0000 mL | ORAL | Status: DC | PRN

## 2015-07-30 MED ORDER — PROPOFOL 500 MG/50ML IV EMUL
INTRAVENOUS | Status: DC | PRN
  Administered 2015-07-30: 30 mg via INTRAVENOUS

## 2015-07-30 MED ORDER — PHENOL 1.4 % MT LIQD: 1.0000 | OROMUCOSAL | Status: DC | PRN

## 2015-07-30 MED ORDER — METOCLOPRAMIDE HCL 10 MG PO TABS: 5.0000 mg | ORAL_TABLET | Freq: Three times a day (TID) | ORAL | Status: DC | PRN

## 2015-07-30 MED ORDER — ISOPROPYL ALCOHOL 70 % SOLN
Status: AC
  Filled 2015-07-30: qty 480

## 2015-07-30 MED ORDER — SODIUM CHLORIDE 0.9 % IV SOLN
INTRAVENOUS | Status: DC
  Administered 2015-07-30 – 2015-07-31 (×2): via INTRAVENOUS

## 2015-07-30 MED ORDER — FENTANYL CITRATE (PF) 100 MCG/2ML IJ SOLN
INTRAMUSCULAR | Status: AC
Start: 2015-07-30 — End: 2015-07-30
  Filled 2015-07-30: qty 2

## 2015-07-30 MED ORDER — HYDROGEN PEROXIDE 3 % EX SOLN
CUTANEOUS | Status: AC
  Filled 2015-07-30: qty 473

## 2015-07-30 MED ORDER — ONDANSETRON HCL 4 MG PO TABS: 4.0000 mg | ORAL_TABLET | Freq: Four times a day (QID) | ORAL | Status: DC | PRN

## 2015-07-30 MED ORDER — POLYETHYL GLYCOL-PROPYL GLYCOL 0.4-0.3 % OP SOLN: 1.0000 [drp] | OPHTHALMIC | Status: DC | PRN

## 2015-07-30 MED ORDER — TRAZODONE HCL 50 MG PO TABS
50.0000 mg | ORAL_TABLET | Freq: Every evening | ORAL | Status: DC | PRN
  Administered 2015-07-30 – 2015-07-31 (×2): 50 mg via ORAL
  Filled 2015-07-30 (×2): qty 1

## 2015-07-30 MED ORDER — LACTATED RINGERS IV SOLN
INTRAVENOUS | Status: DC
  Administered 2015-07-30: 14:00:00 via INTRAVENOUS
  Administered 2015-07-30: 1000 mL via INTRAVENOUS

## 2015-07-30 MED ORDER — HYDROMORPHONE HCL 1 MG/ML IJ SOLN: 0.5000 mg | INTRAMUSCULAR | Status: DC | PRN

## 2015-07-30 MED ORDER — MENTHOL 3 MG MT LOZG: 1.0000 | LOZENGE | OROMUCOSAL | Status: DC | PRN

## 2015-07-30 MED ORDER — SENNA 8.6 MG PO TABS
2.0000 | ORAL_TABLET | Freq: Every day | ORAL | Status: DC
  Administered 2015-07-30 – 2015-07-31 (×2): 17.2 mg via ORAL
  Filled 2015-07-30 (×2): qty 2

## 2015-07-30 MED ORDER — ACETAMINOPHEN 325 MG PO TABS: 650.0000 mg | ORAL_TABLET | Freq: Four times a day (QID) | ORAL | Status: DC | PRN

## 2015-07-30 MED ORDER — TAMSULOSIN HCL 0.4 MG PO CAPS
0.4000 mg | ORAL_CAPSULE | Freq: Every day | ORAL | Status: DC
  Administered 2015-07-30 – 2015-07-31 (×2): 0.4 mg via ORAL
  Filled 2015-07-30 (×4): qty 1

## 2015-07-30 MED ORDER — BUPIVACAINE-EPINEPHRINE 0.25% -1:200000 IJ SOLN
INTRAMUSCULAR | Status: DC | PRN
Start: 2015-07-30 — End: 2015-07-30
  Administered 2015-07-30: 30 mL

## 2015-07-30 MED ORDER — HYDROCODONE-ACETAMINOPHEN 5-325 MG PO TABS
1.0000 | ORAL_TABLET | ORAL | Status: DC | PRN
  Administered 2015-07-31 – 2015-08-01 (×5): 2 via ORAL
  Filled 2015-07-30 (×5): qty 2

## 2015-07-30 MED ORDER — FENTANYL CITRATE (PF) 100 MCG/2ML IJ SOLN
INTRAMUSCULAR | Status: DC | PRN
  Administered 2015-07-30 (×2): 50 ug via INTRAVENOUS

## 2015-07-30 MED ORDER — ISOPROPYL ALCOHOL (RUBBING) 70 % SOLN
Status: DC | PRN
  Administered 2015-07-30: 75 mL via TOPICAL

## 2015-07-30 MED ORDER — KETOROLAC TROMETHAMINE 30 MG/ML IJ SOLN
INTRAMUSCULAR | Status: DC | PRN
  Administered 2015-07-30: 30 mg

## 2015-07-30 MED ORDER — BUPIVACAINE-EPINEPHRINE (PF) 0.25% -1:200000 IJ SOLN
INTRAMUSCULAR | Status: AC
  Filled 2015-07-30: qty 30

## 2015-07-30 MED ORDER — SIMVASTATIN 40 MG PO TABS
40.0000 mg | ORAL_TABLET | Freq: Every day | ORAL | Status: DC
  Administered 2015-07-30 – 2015-07-31 (×2): 40 mg via ORAL
  Filled 2015-07-30 (×4): qty 1

## 2015-07-30 MED ORDER — HYDROGEN PEROXIDE 3 % EX SOLN
CUTANEOUS | Status: DC | PRN
  Administered 2015-07-30: 1

## 2015-07-30 MED ORDER — CHLORHEXIDINE GLUCONATE 4 % EX LIQD: 60.0000 mL | Freq: Once | CUTANEOUS | Status: DC

## 2015-07-30 MED ORDER — PANTOPRAZOLE SODIUM 40 MG PO TBEC
80.0000 mg | DELAYED_RELEASE_TABLET | Freq: Every day | ORAL | Status: DC
  Administered 2015-07-31 – 2015-08-01 (×2): 80 mg via ORAL
  Filled 2015-07-30 (×3): qty 2

## 2015-07-30 MED ORDER — CEFAZOLIN SODIUM-DEXTROSE 2-4 GM/100ML-% IV SOLN
2.0000 g | Freq: Four times a day (QID) | INTRAVENOUS | Status: AC
  Administered 2015-07-30 – 2015-07-31 (×2): 2 g via INTRAVENOUS
  Filled 2015-07-30 (×3): qty 100

## 2015-07-30 MED ORDER — DEXAMETHASONE SODIUM PHOSPHATE 10 MG/ML IJ SOLN
10.0000 mg | Freq: Once | INTRAMUSCULAR | Status: AC
  Administered 2015-07-31: 10 mg via INTRAVENOUS
  Filled 2015-07-30 (×2): qty 1

## 2015-07-30 MED ORDER — METHOCARBAMOL 500 MG PO TABS
500.0000 mg | ORAL_TABLET | Freq: Four times a day (QID) | ORAL | Status: DC | PRN
  Administered 2015-07-31: 500 mg via ORAL
  Filled 2015-07-30: qty 1

## 2015-07-30 MED ORDER — PHENYLEPHRINE HCL 10 MG/ML IJ SOLN
INTRAMUSCULAR | Status: DC | PRN
  Administered 2015-07-30: 80 ug via INTRAVENOUS

## 2015-07-30 MED ORDER — SODIUM CHLORIDE 0.9 % IJ SOLN
INTRAMUSCULAR | Status: DC | PRN
Start: 2015-07-30 — End: 2015-07-30
  Administered 2015-07-30: 30 mL

## 2015-07-30 MED ORDER — KETOROLAC TROMETHAMINE 15 MG/ML IJ SOLN
7.5000 mg | Freq: Four times a day (QID) | INTRAMUSCULAR | Status: AC
  Administered 2015-07-30 – 2015-07-31 (×4): 7.5 mg via INTRAVENOUS
  Filled 2015-07-30 (×4): qty 1

## 2015-07-30 MED ORDER — ACETAMINOPHEN 650 MG RE SUPP: 650.0000 mg | Freq: Four times a day (QID) | RECTAL | Status: DC | PRN

## 2015-07-30 MED ORDER — APIXABAN 2.5 MG PO TABS
2.5000 mg | ORAL_TABLET | Freq: Two times a day (BID) | ORAL | Status: DC
  Administered 2015-07-31 – 2015-08-01 (×3): 2.5 mg via ORAL
  Filled 2015-07-30 (×5): qty 1

## 2015-07-30 MED ORDER — PHENYLEPHRINE HCL 10 MG/ML IJ SOLN
20.0000 mg | INTRAMUSCULAR | Status: DC | PRN
  Administered 2015-07-30: 30 ug/min via INTRAVENOUS

## 2015-07-30 MED ORDER — PROPOFOL 500 MG/50ML IV EMUL
INTRAVENOUS | Status: DC | PRN
  Administered 2015-07-30: 75 ug/kg/min via INTRAVENOUS

## 2015-07-30 MED ORDER — POLYVINYL ALCOHOL 1.4 % OP SOLN: 1.0000 [drp] | OPHTHALMIC | Status: DC | PRN

## 2015-07-30 MED ORDER — POLYETHYLENE GLYCOL 3350 17 G PO PACK
17.0000 g | PACK | Freq: Every day | ORAL | Status: DC
  Administered 2015-07-31 – 2015-08-01 (×2): 17 g via ORAL
  Filled 2015-07-30 (×3): qty 1

## 2015-07-30 MED ORDER — SORBITOL 70 % SOLN
30.0000 mL | Freq: Every day | Status: DC | PRN
  Filled 2015-07-30: qty 30

## 2015-07-30 MED ORDER — DIPHENHYDRAMINE HCL 12.5 MG/5ML PO ELIX: 12.5000 mg | ORAL_SOLUTION | ORAL | Status: DC | PRN

## 2015-07-30 MED ORDER — BUPIVACAINE HCL (PF) 0.75 % IJ SOLN
INTRAMUSCULAR | Status: DC | PRN
  Administered 2015-07-30: 13 mg via INTRATHECAL

## 2015-07-30 SURGICAL SUPPLY — 71 items
BAG SPEC THK2 15X12 ZIP CLS (MISCELLANEOUS)
BAG ZIPLOCK 12X15 (MISCELLANEOUS) IMPLANT
BANDAGE ACE 4X5 VEL STRL LF (GAUZE/BANDAGES/DRESSINGS) ×3 IMPLANT
BANDAGE ACE 6X5 VEL STRL LF (GAUZE/BANDAGES/DRESSINGS) ×3 IMPLANT
BANDAGE ESMARK 6X9 LF (GAUZE/BANDAGES/DRESSINGS) ×1 IMPLANT
BLADE SAW RECIPROCATING 77.5 (BLADE) ×3 IMPLANT
BNDG CMPR 9X6 STRL LF SNTH (GAUZE/BANDAGES/DRESSINGS) ×1
BNDG ESMARK 6X9 LF (GAUZE/BANDAGES/DRESSINGS) ×3
BONE CEMENT SIMPLEX TOBRAMYCIN (Cement) ×9 IMPLANT
CAPT KNEE TOTAL 3 ×2 IMPLANT
CEMENT BONE SIMPLEX TOBRAMYCIN (Cement) IMPLANT
CHLORAPREP W/TINT 26ML (MISCELLANEOUS) ×6 IMPLANT
CUFF TOURN SGL QUICK 34 (TOURNIQUET CUFF) ×3
CUFF TRNQT CYL 34X4X40X1 (TOURNIQUET CUFF) ×1 IMPLANT
DECANTER SPIKE VIAL GLASS SM (MISCELLANEOUS) ×6 IMPLANT
DRAPE EXTREMITY T 121X128X90 (DRAPE) ×3 IMPLANT
DRAPE POUCH INSTRU U-SHP 10X18 (DRAPES) ×3 IMPLANT
DRAPE SHEET LG 3/4 BI-LAMINATE (DRAPES) ×6 IMPLANT
DRAPE U-SHAPE 47X51 STRL (DRAPES) ×3 IMPLANT
DRSG AQUACEL AG ADV 3.5X10 (GAUZE/BANDAGES/DRESSINGS) ×3 IMPLANT
DRSG TEGADERM 4X4.75 (GAUZE/BANDAGES/DRESSINGS) IMPLANT
ELECT PENCIL ROCKER SW 15FT (MISCELLANEOUS) ×3 IMPLANT
ELECT REM PT RETURN 9FT ADLT (ELECTROSURGICAL) ×3
ELECTRODE REM PT RTRN 9FT ADLT (ELECTROSURGICAL) ×1 IMPLANT
EVACUATOR 1/8 PVC DRAIN (DRAIN) IMPLANT
FACESHIELD WRAPAROUND (MASK) ×6 IMPLANT
FACESHIELD WRAPAROUND OR TEAM (MASK) ×2 IMPLANT
GAUZE SPONGE 4X4 12PLY STRL (GAUZE/BANDAGES/DRESSINGS) ×1 IMPLANT
GLOVE BIO SURGEON STRL SZ8.5 (GLOVE) ×6 IMPLANT
GLOVE BIOGEL M STRL SZ7.5 (GLOVE) ×2 IMPLANT
GLOVE BIOGEL PI IND STRL 8.5 (GLOVE) ×1 IMPLANT
GLOVE BIOGEL PI INDICATOR 8.5 (GLOVE) ×2
GLOVE ECLIPSE 7.5 STRL STRAW (GLOVE) ×6 IMPLANT
GLOVE SURG SS PI 7.5 STRL IVOR (GLOVE) ×6 IMPLANT
GOWN SPEC L3 XXLG W/TWL (GOWN DISPOSABLE) ×3 IMPLANT
HANDPIECE INTERPULSE COAX TIP (DISPOSABLE) ×3
HOOD PEEL AWAY FLYTE STAYCOOL (MISCELLANEOUS) ×6 IMPLANT
LIQUID BAND (GAUZE/BANDAGES/DRESSINGS) ×4 IMPLANT
MARKER SKIN DUAL TIP RULER LAB (MISCELLANEOUS) ×3 IMPLANT
NDL SPNL 18GX3.5 QUINCKE PK (NEEDLE) ×1 IMPLANT
NEEDLE SPNL 18GX3.5 QUINCKE PK (NEEDLE) ×3 IMPLANT
NS IRRIG 1000ML POUR BTL (IV SOLUTION) ×3 IMPLANT
PADDING CAST COTTON 6X4 STRL (CAST SUPPLIES) ×3 IMPLANT
POSITIONER SURGICAL ARM (MISCELLANEOUS) ×3 IMPLANT
SAW OSC TIP CART 19.5X105X1.3 (SAW) ×3 IMPLANT
SEALER BIPOLAR AQUA 6.0 (INSTRUMENTS) ×3 IMPLANT
SET HNDPC FAN SPRY TIP SCT (DISPOSABLE) ×1 IMPLANT
SET PAD KNEE POSITIONER (MISCELLANEOUS) ×3 IMPLANT
SOL PREP POV-IOD 4OZ 10% (MISCELLANEOUS) ×3 IMPLANT
SPONGE DRAIN TRACH 4X4 STRL 2S (GAUZE/BANDAGES/DRESSINGS) ×2 IMPLANT
SPONGE LAP 18X18 X RAY DECT (DISPOSABLE) IMPLANT
STEM CEMENTED (Stem) ×2 IMPLANT
SUCTION FRAZIER HANDLE 12FR (TUBING) ×2
SUCTION TUBE FRAZIER 12FR DISP (TUBING) ×1 IMPLANT
SUT MNCRL AB 3-0 PS2 18 (SUTURE) ×3 IMPLANT
SUT MON AB 2-0 CT1 36 (SUTURE) ×6 IMPLANT
SUT VIC AB 1 CT1 27 (SUTURE) ×6
SUT VIC AB 1 CT1 27XBRD ANTBC (SUTURE) ×2 IMPLANT
SUT VIC AB 1 CT1 36 (SUTURE) ×3 IMPLANT
SUT VIC AB 2-0 CT1 27 (SUTURE) ×3
SUT VIC AB 2-0 CT1 TAPERPNT 27 (SUTURE) ×1 IMPLANT
SUT VLOC 180 0 24IN GS25 (SUTURE) ×3 IMPLANT
SYR 50ML LL SCALE MARK (SYRINGE) ×3 IMPLANT
TOWEL OR 17X26 10 PK STRL BLUE (TOWEL DISPOSABLE) ×6 IMPLANT
TOWEL OR NON WOVEN STRL DISP B (DISPOSABLE) ×3 IMPLANT
TOWER CARTRIDGE SMART MIX (DISPOSABLE) IMPLANT
TRAY FOLEY W/METER SILVER 14FR (SET/KITS/TRAYS/PACK) IMPLANT
TRAY FOLEY W/METER SILVER 16FR (SET/KITS/TRAYS/PACK) IMPLANT
WATER STERILE IRR 1500ML POUR (IV SOLUTION) ×3 IMPLANT
WRAP KNEE MAXI GEL POST OP (GAUZE/BANDAGES/DRESSINGS) ×3 IMPLANT
YANKAUER SUCT BULB TIP 10FT TU (MISCELLANEOUS) ×3 IMPLANT

## 2015-07-30 NOTE — Anesthesia Procedure Notes (Signed)
Spinal Patient location during procedure: OR Start time: 07/30/2015 1:10 PM End time: 07/30/2015 1:22 PM Staffing Performed by: anesthesiologist  Preanesthetic Checklist Completed: patient identified, site marked, surgical consent, pre-op evaluation, timeout performed, IV checked, risks and benefits discussed and monitors and equipment checked Spinal Block Patient position: sitting Prep: Betadine Patient monitoring: heart rate, continuous pulse ox and blood pressure Approach: right paramedian Location: L3-4 Injection technique: single-shot Needle Needle type: Quincke  Needle gauge: 22 G Needle length: 9 cm Assessment Sensory level: T6 Additional Notes Expiration date of kit checked and confirmed. Patient tolerated procedure well, without complications.

## 2015-07-30 NOTE — Anesthesia Preprocedure Evaluation (Signed)
Anesthesia Evaluation  Patient identified by MRN, date of birth, ID band Patient awake    Reviewed: Allergy & Precautions, H&P , NPO status , Patient's Chart, lab work & pertinent test results, reviewed documented beta blocker date and time   Airway Mallampati: II  TM Distance: >3 FB Neck ROM: full    Dental no notable dental hx. (+) Dental Advisory Given, Teeth Intact   Pulmonary neg pulmonary ROS, former smoker,    Pulmonary exam normal breath sounds clear to auscultation       Cardiovascular Exercise Tolerance: Good negative cardio ROS Normal cardiovascular exam+ dysrhythmias Atrial Fibrillation  Rhythm:regular Rate:Normal     Neuro/Psych Macular degeneration left eye negative neurological ROS  negative psych ROS   GI/Hepatic Neg liver ROS, GERD  Medicated and Controlled,Stomach cancer. Barretts esophagus   Endo/Other  negative endocrine ROS  Renal/GU negative Renal ROS  negative genitourinary   Musculoskeletal   Abdominal   Peds  Hematology  (+) Blood dyscrasia, , Non-Hodgkins lymphoma   Anesthesia Other Findings   Reproductive/Obstetrics negative OB ROS                             Anesthesia Physical Anesthesia Plan  ASA: III  Anesthesia Plan: Spinal   Post-op Pain Management:    Induction:   Airway Management Planned:   Additional Equipment:   Intra-op Plan:   Post-operative Plan:   Informed Consent: I have reviewed the patients History and Physical, chart, labs and discussed the procedure including the risks, benefits and alternatives for the proposed anesthesia with the patient or authorized representative who has indicated his/her understanding and acceptance.   Dental Advisory Given  Plan Discussed with: CRNA and Surgeon  Anesthesia Plan Comments:         Anesthesia Quick Evaluation

## 2015-07-30 NOTE — Op Note (Signed)
OPERATIVE REPORT  SURGEON: Rod Can, MD   ASSISTANT: Nehemiah Massed, PA-C.  PREOPERATIVE DIAGNOSIS: Left knee arthritis with tibial subchondral insufficiency fracture.  POSTOPERATIVE DIAGNOSIS: Left knee arthritis with tibial subchondral insufficiency fracture.   PROCEDURE: Left total knee arthroplasty.   IMPLANTS: Stryker Triathlon CR femur, size 7. Stryker universal tibia, size 7, with 12 x 100 mm cemented stem. X3 polyethelyene insert, size 9 mm, CR. 3 button asymmetric patella, size 40 mm. Simplex P bone cement.  ANESTHESIA:  Spinal  TOURNIQUET TIME: 24 min at 3104mm Hg.  ESTIMATED BLOOD LOSS: 200 mL.  ANTIBIOTICS: 2 g ancef.  DRAINS: Medium HV x1.  COMPLICATIONS: None   CONDITION: PACU - hemodynamically stable.   BRIEF CLINICAL NOTE: Jason [name] is a 82 y.o. male with a long-standing history of a large non healing subchondral insufficiency fracture to the proximal left tibia  and knee arthritis. After failing conservative management, the patient was indicated for total knee arthroplasty. The risks, benefits, and alternatives to the procedure were explained, and the patient elected to proceed.  PROCEDURE IN DETAIL: Spinal anesthesia was obtained in the pre-op holding area. Once inside the operative room, a foley catheter was inserted. The patient was then positioned, a nonsterile tourniquet was placed, and the lower extremity was prepped and draped in the normal sterile surgical fashion. A time-out was called verifying side and site of surgery. The patient received IV antibiotics within 60 minutes of beginning the procedure.   An anterior approach to the knee was performed utilizing a midvastus arthrotomy. A medial release was performed and the patellar fat pad was excised. Stryker navigation was used to cut the distal femur perpendicular to the mechanical axis. A  freehand patellar resection was performed, and the patella was sized an prepared with 3 lug holes.  Nagivation was used to make a neutral proximal tibia resection, taking 8 mm of bone from the less affected lateral side with 3 degrees of slope. The menisci were excised. A spacer block was placed, and the alignment and balance in extension were confirmed.   The distal femur was sized using the 3-degree external rotation guide referencing the posterior femoral cortex. The appropriate 4-in-1 cutting block was pinned into place. Rotation was checked using Whiteside's line, the epicondylar axis, and then confirmed with a spacer block in flexion. The remaining femoral cuts were performed, taking care to protect the MCL.  The tibia was sized and the trial tray was pinned into place. The remaining trail components were inserted. The knee was stable to varus and valgus stress through a full range of motion. The patella tracked centrally, and the PCL was well balanced. The trial components were removed, and the proximal tibial surface was examined. He had softening of the bone of the lateral tibial surface. I reamed the tibia for a 12 x 100 mm stem and the tibia was prepared. The extremity was exsanguinated with an Esmarch, and the tourniquet was inflated. Small drill holes were made in the sclerotic subchondral bone.The cut bony surfaces were irrigated with pulse lavage. The tibial canal was brushed and then dried with a tampon. A cement restrictor was placed. Final components were cemented into place and excess cement was cleared. The trial insert was placed, and the knee was brought into extension while the cement polymerized. Once the cement was hard, the knee was tested for a final time and found to be well balanced. The trial insert was exchanged for the real polyethylene insert.  The wound was copiously  irrigated with a dilute betadine solution followed by normal saline with pulse lavage. Marcaine solution  was injected into the periarticular soft tissue. The wound was closed in layers using #1 Vicryl and V-Loc for the fascia, 2-0 Vicryl for the subcutaneous fat, 2-0 Monocryl for the deep dermal layer, 3-0 running Monocryl subcuticular Stitch, and Dermabond for the skin. Once the glue was fully dried, an Aquacell Ag and compressive dressing were applied. The tourniquet was let down, and the patient was transported to the recovery room in stable ondition. Sponge, needle, and instrument counts were correct at the end of the case x2. The patient tolerated the procedure well and there were no known complications.  Please note that a surgical assistant was a medical necessity for this procedure in order to perform it in a safe and expeditious manner. Surgical assistant was necessary to retract the ligaments and vital neurovascular structures to prevent injury to them and also necessary for proper positioning of the limb to allow for anatomic placement of the prosthesis.

## 2015-07-30 NOTE — Interval H&P Note (Signed)
History and Physical Interval Note:  07/30/2015 12:51 PM  Jason [name]  has presented today for surgery, with the diagnosis of left knee osteoarthritis  The various methods of treatment have been discussed with the patient and family. After consideration of risks, benefits and other options for treatment, the patient has consented to  Procedure(s): LEFT TOTAL KNEE ARTHROPLASTY WITH COMPUTER NAVIGATION (Left) as a surgical intervention .  The patient's history has been reviewed, patient examined, no change in status, stable for surgery.  I have reviewed the patient's chart and labs.  Questions were answered to the patient's satisfaction.     Allysha Tryon, Horald Pollen

## 2015-07-30 NOTE — Anesthesia Postprocedure Evaluation (Signed)
Anesthesia Post Note  Patient: Jason [name]  Procedure(s) Performed: Procedure(s) (LRB): LEFT TOTAL KNEE ARTHROPLASTY WITH COMPUTER NAVIGATION (Left)  Patient location during evaluation: PACU Anesthesia Type: Spinal Level of consciousness: awake and alert Pain management: pain level controlled Vital Signs Assessment: post-procedure vital signs reviewed and stable Respiratory status: spontaneous breathing, nonlabored ventilation, respiratory function stable and patient connected to nasal cannula oxygen Cardiovascular status: blood pressure returned to baseline and stable Postop Assessment: no signs of nausea or vomiting Anesthetic complications: no    Last Vitals:  Filed Vitals:   07/30/15 1724 07/30/15 1831  BP: 147/59 159/69  Pulse: 73 63  Temp: 36.4 C 36.7 C  Resp: 16 17    Last Pain: There were no vitals filed for this visit.               Keilynn Marano L

## 2015-07-30 NOTE — Discharge Instructions (Signed)
° °Dr. Carrye Goller °Total Joint Specialist °[facility] °[address] °[place], [place] [address] °[phone] ° °TOTAL KNEE REPLACEMENT POSTOPERATIVE DIRECTIONS ° ° ° °Knee Rehabilitation, Guidelines Following Surgery  °Results after knee surgery are often greatly improved when you follow the exercise, range of motion and muscle strengthening exercises prescribed by your doctor. Safety measures are also important to protect the knee from further injury. Any time any of these exercises cause you to have increased pain or swelling in your knee joint, decrease the amount until you are comfortable again and slowly increase them. If you have problems or questions, call your caregiver or physical therapist for advice.  ° °WEIGHT BEARING °Weight bearing as tolerated with assist device (walker, cane, etc) as directed, use it as long as suggested by your surgeon or therapist, typically at least 4-6 weeks. ° °HOME CARE INSTRUCTIONS  °Remove items at home which could result in a fall. This includes throw rugs or furniture in walking pathways.  °Continue medications as instructed at time of discharge. °You may have some home medications which will be placed on hold until you complete the course of blood thinner medication.  °You may start showering once you are discharged home but do not submerge the incision under water. Just pat the incision dry and apply a dry gauze dressing on daily. °Walk with walker as instructed.  °You may resume a sexual relationship in one month or when given the OK by your doctor.  °· Use walker as long as suggested by your caregivers. °· Avoid periods of inactivity such as sitting longer than an hour when not asleep. This helps prevent blood clots.  °You may put full weight on your legs and walk as much as is comfortable.  °You may return to work once you are cleared by your doctor.  °Do not drive a car for 6 weeks or until released by you surgeon.  °· Do not drive  while taking narcotics.  °Wear the elastic stockings for three weeks following surgery during the day but you may remove then at night. °Make sure you keep all of your appointments after your operation with all of your doctors and caregivers. You should call the office at the above phone number and make an appointment for approximately two weeks after the date of your surgery. °Do not remove your surgical dressing. The dressing is waterproof; you may take showers in 3 days, but do not take tub baths or submerge the dressing. °Please pick up a stool softener and laxative for home use as long as you are requiring pain medications. °· ICE to the affected knee every three hours for 30 minutes at a time and then as needed for pain and swelling.  Continue to use ice on the knee for pain and swelling from surgery. You may notice swelling that will progress down to the foot and ankle.  This is normal after surgery.  Elevate the leg when you are not up walking on it.   °It is important for you to complete the blood thinner medication as prescribed by your doctor. °· Continue to use the breathing machine which will help keep your temperature down.  It is common for your temperature to cycle up and down following surgery, especially at night when you are not up moving around and exerting yourself.  The breathing machine keeps your lungs expanded and your temperature down. ° °RANGE OF MOTION AND STRENGTHENING EXERCISES  °Rehabilitation of the knee is important following   a knee injury or an operation. After just a few days of immobilization, the muscles of the thigh which control the knee become weakened and shrink (atrophy). Knee exercises are designed to build up the tone and strength of the thigh muscles and to improve knee motion. Often times heat used for twenty to thirty minutes before working out will loosen up your tissues and help with improving the range of motion but do not use heat for the first two weeks following  surgery. These exercises can be done on a training (exercise) mat, on the floor, on a table or on a bed. Use what ever works the best and is most comfortable for you Knee exercises include:  °Leg Lifts - While your knee is still immobilized in a splint or cast, you can do straight leg raises. Lift the leg to 60 degrees, hold for 3 sec, and slowly lower the leg. Repeat 10-20 times 2-3 times daily. Perform this exercise against resistance later as your knee gets better.  °Quad and Hamstring Sets - Tighten up the muscle on the front of the thigh (Quad) and hold for 5-10 sec. Repeat this 10-20 times hourly. Hamstring sets are done by pushing the foot backward against an object and holding for 5-10 sec. Repeat as with quad sets.  °A rehabilitation program following serious knee injuries can speed recovery and prevent re-injury in the future due to weakened muscles. Contact your doctor or a physical therapist for more information on knee rehabilitation.  ° °SKILLED REHAB INSTRUCTIONS: °If the patient is transferred to a skilled rehab facility following release from the hospital, a list of the current medications will be sent to the facility for the patient to continue.  When discharged from the skilled rehab facility, please have the facility set up the patient's Home Health Physical Therapy prior to being released. Also, the skilled facility will be responsible for providing the patient with their medications at time of release from the facility to include their pain medication, the muscle relaxants, and their blood thinner medication. If the patient is still at the rehab facility at time of the two week follow up appointment, the skilled rehab facility will also need to assist the patient in arranging follow up appointment in our office and any transportation needs. ° °MAKE SURE YOU:  °Understand these instructions.  °Will watch your condition.  °Will get help right away if you are not doing well or get worse.   ° ° °Pick up stool softner and laxative for home use following surgery while on pain medications. °Do NOT remove your dressing. You may shower.  °Do not take tub baths or submerge incision under water. °May shower starting three days after surgery. °Please use a clean towel to pat the incision dry following showers. °Continue to use ice for pain and swelling after surgery. °Do not use any lotions or creams on the incision until instructed by your surgeon. ° °

## 2015-07-30 NOTE — Transfer of Care (Signed)
Immediate Anesthesia Transfer of Care Note  Patient: Jason [name]  Procedure(s) Performed: Procedure(s): LEFT TOTAL KNEE ARTHROPLASTY WITH COMPUTER NAVIGATION (Left)  Patient Location: PACU  Anesthesia Type:Spinal  Level of Consciousness: sedated, patient cooperative and responds to stimulation  Airway & Oxygen Therapy: Patient Spontanous Breathing and Patient connected to face mask oxygen  Post-op Assessment: Report given to RN and Post -op Vital signs reviewed and stable  Post vital signs: Reviewed and stable  Last Vitals:  Filed Vitals:   07/30/15 1040  BP: 143/60  Pulse: 65  Temp: 36.3 C  Resp: 16    Last Pain: There were no vitals filed for this visit.    Patients Stated Pain Goal: 4 ([phone])  Complications: No apparent anesthesia complications

## 2015-07-30 NOTE — H&P (View-Only) (Signed)
TOTAL KNEE ADMISSION H&P  Patient is being admitted for left total knee arthroplasty.  Subjective:  Chief Complaint:left knee pain.  HPI: Jason [name], 82 y.o. male, has a history of pain and functional disability in the left knee due to arthritis and large tibial subcortical stress fracture and has failed non-surgical conservative treatments for greater than 12 weeks to includeNSAID's and/or analgesics, corticosteriod injections, flexibility and strengthening excercises, supervised PT with diminished ADL's post treatment, use of assistive devices, weight reduction as appropriate and activity modification.  Onset of symptoms was abrupt, starting 1 years ago with rapidlly worsening course since that time. The patient noted no past surgery on the left knee(s).  Patient currently rates pain in the left knee(s) at 10 out of 10 with activity. Patient has night pain, worsening of pain with activity and weight bearing, pain that interferes with activities of daily living, pain with passive range of motion and joint swelling.  Patient has evidence of subchondral cysts, subchondral sclerosis and joint space narrowing by imaging studies.There is no active infection.  Patient Active Problem List   Diagnosis Date Noted  . BPH (benign prostatic hyperplasia) 07/16/2012  . Multiple nodules of lung 09/16/2011  . GERD (gastroesophageal reflux disease) 06/14/2011  . Constipation, chronic 06/14/2011  . NHL (non-Hodgkin's lymphoma) ([place]) 01/03/2010  . BLADDER CANCER 05/28/2007  . HYPERLIPIDEMIA 05/28/2007  . HIATAL HERNIA 05/28/2007  . BARRETT'S ESOPHAGUS, HX OF 05/28/2007   Past Medical History  Diagnosis Date  . Hyperlipidemia   . GERD (gastroesophageal reflux disease)   . Recurrent cold sores   . Personal history of colonic polyps 07/15/2010    tubular adenoma  . Barrett esophagus   . PUD (peptic ulcer disease)   . Hiatal hernia   . Diverticulosis of colon (without mention of hemorrhage)   .  Multiple nodules of lung 09/16/2011  . Stomach cancer     NHL origin  . Bladder cancer dx'd [id number]    surg only  . Non Hodgkin's lymphoma dx'd 01/03/2010  . Prostate cancer     patient denies  . Colon polyps     adenomatous    Past Surgical History  Procedure Laterality Date  . Bone spur      left shoulder  . Wrist surgery      Bilateral  . Cholecystectomy    . Appendectomy    . Trigger finger release      bilateral hands  . Tendon repair      right arm  . Laser of prostate w/ green light pvp       (Not in a hospital admission) Allergies  Allergen Reactions  . Naprelan [Naproxen Sodium] Other (See Comments)    Social History  Substance Use Topics  . Smoking status: Former Smoker    Quit date: 10/29/1966  . Smokeless tobacco: Former Systems developer    Quit date: 06/29/1998  . Alcohol Use: No    Family History  Problem Relation Age of Onset  . Prostate cancer Father   . Bone cancer Brother   . Cancer Brother     back     Review of Systems  Constitutional: Negative.   HENT: Negative.   Eyes: Negative.   Respiratory: Negative.   Cardiovascular: Negative.   Gastrointestinal: Positive for constipation. Negative for heartburn, nausea, vomiting, abdominal pain, diarrhea, blood in stool and melena.  Genitourinary: Negative.   Musculoskeletal: Positive for myalgias, back pain, joint pain and falls.  Skin: Negative.   Neurological: Negative.  Endo/Heme/Allergies: Negative.   Psychiatric/Behavioral: Negative.     Objective:  Physical Exam  Constitutional: He is oriented to person, place, and time. He appears well-developed and well-nourished.  HENT:  Head: Normocephalic and atraumatic.  Eyes: Conjunctivae and EOM are normal. Pupils are equal, round, and reactive to light.  Neck: Normal range of motion. Neck supple.  Cardiovascular: Normal rate and normal heart sounds.   Respiratory: Effort normal and breath sounds normal.  GI: Soft. Bowel sounds are normal. He  exhibits no distension.  Genitourinary:  deferred  Musculoskeletal:       Left knee: He exhibits decreased range of motion, swelling and bony tenderness. Tenderness found. Medial joint line and lateral joint line tenderness noted.  Neurological: He is alert and oriented to person, place, and time. He has normal reflexes.  Skin: Skin is dry.  Psychiatric: He has a normal mood and affect. His behavior is normal. Judgment and thought content normal.    Vital signs in last 24 hours: @VSRANGES @  Labs:   Estimated body mass index is 34.71 kg/(m^2) as calculated from the following:   Height as of 12/27/14: 5\' 9"  (1.753 m).   Weight as of 12/27/14: 106.652 kg (235 lb 2 oz).   Imaging Review Plain radiographs demonstrate severe degenerative joint disease of the left knee(s). The overall alignment ismild varus. The bone quality appears to be adequate for age and reported activity level.  Assessment/Plan:  End stage arthritis, left knee with extensive tibial subcortical fracture  The patient history, physical examination, clinical judgment of the provider and imaging studies are consistent with end stage degenerative joint disease of the left knee(s) and total knee arthroplasty is deemed medically necessary. The treatment options including medical management, injection therapy arthroscopy and arthroplasty were discussed at length. The risks and benefits of total knee arthroplasty were presented and reviewed. The risks due to aseptic loosening, infection, stiffness, patella tracking problems, thromboembolic complications and other imponderables were discussed. The patient acknowledged the explanation, agreed to proceed with the plan and consent was signed. Patient is being admitted for inpatient treatment for surgery, pain control, PT, OT, prophylactic antibiotics, VTE prophylaxis, progressive ambulation and ADL's and discharge planning. The patient is planning to be discharged home with home health  services. D/C eliquis 3 days postop per Dr. Wynonia Lawman

## 2015-07-31 ENCOUNTER — Encounter (HOSPITAL_COMMUNITY): Payer: Self-pay | Admitting: Orthopedic Surgery

## 2015-07-31 LAB — BASIC METABOLIC PANEL
Anion gap: 9 (ref 5–15)
BUN: 20 mg/dL (ref 6–20)
CHLORIDE: 108 mmol/L (ref 101–111)
CO2: 22 mmol/L (ref 22–32)
Calcium: 8.2 mg/dL — ABNORMAL LOW (ref 8.9–10.3)
Creatinine, Ser: 0.9 mg/dL (ref 0.61–1.24)
GFR calc non Af Amer: >60 mL/min (ref >60)
Glucose, Bld: 106 mg/dL — ABNORMAL HIGH (ref 65–99)
POTASSIUM: 3.8 mmol/L (ref 3.5–5.1)
SODIUM: 139 mmol/L (ref 135–145)

## 2015-07-31 LAB — CBC
HCT: 34.5 % — ABNORMAL LOW (ref 39.0–52.0)
Hemoglobin: 11.4 g/dL — ABNORMAL LOW (ref 13.0–17.0)
MCH: 29.2 pg (ref 26.0–34.0)
MCHC: 33 g/dL (ref 30.0–36.0)
MCV: 88.2 fL (ref 78.0–100.0)
Platelets: 157 10*3/uL (ref 150–400)
RBC: 3.91 MIL/uL — ABNORMAL LOW (ref 4.22–5.81)
RDW: 13 % (ref 11.5–15.5)
WBC: 10.8 10*3/uL — ABNORMAL HIGH (ref 4.0–10.5)

## 2015-07-31 MED ORDER — DOCUSATE SODIUM 100 MG PO CAPS: 100.0000 mg | ORAL_CAPSULE | Freq: Two times a day (BID) | ORAL | Status: DC

## 2015-07-31 MED ORDER — FLEET ENEMA 7-19 GM/118ML RE ENEM
1.0000 | ENEMA | Freq: Once | RECTAL | Status: AC
  Administered 2015-07-31: 1 via RECTAL
  Filled 2015-07-31: qty 1

## 2015-07-31 MED ORDER — HYDROCODONE-ACETAMINOPHEN 5-325 MG PO TABS: 1.0000 | ORAL_TABLET | ORAL | Status: DC | PRN

## 2015-07-31 MED ORDER — SENNA 8.6 MG PO TABS: 2.0000 | ORAL_TABLET | Freq: Every day | ORAL | Status: DC

## 2015-07-31 MED ORDER — ONDANSETRON HCL 4 MG PO TABS: 4.0000 mg | ORAL_TABLET | Freq: Four times a day (QID) | ORAL | Status: DC | PRN

## 2015-07-31 MED FILL — Isopropyl Alcohol 70%: Qty: 480 | Status: AC

## 2015-07-31 NOTE — Progress Notes (Addendum)
   Subjective:  Patient reports pain as mild to moderate.  No c/o. Denies N/V/CP/SOB.  Objective:   VITALS:   Filed Vitals:   07/30/15 1926 07/30/15 2045 07/31/15 0134 07/31/15 0500  BP: 153/55 143/56 149/61 144/54  Pulse: 61 66 70 72  Temp:  97.7 F (36.5 C) 97.8 F (36.6 C) 98.8 F (37.1 C)  TempSrc: Oral Oral Oral Oral  Resp:  18 18 18   Height:      Weight:      SpO2: 100% 97% 98% 94%    ABD soft Sensation intact distally Intact pulses distally Dorsiflexion/Plantar flexion intact Incision: dressing C/D/I Compartment soft   Lab Results  Component Value Date   WBC 10.8* 07/31/2015   HGB 11.4* 07/31/2015   HCT 34.5* 07/31/2015   MCV 88.2 07/31/2015   PLT 157 07/31/2015   BMET    Component Value Date/Time   NA 139 07/31/2015 0404   NA 140 01/08/2013 0919   NA 141 09/09/2011 0917   K 3.8 07/31/2015 0404   K 4.6 01/08/2013 0919   K 5.0* 09/09/2011 0917   CL 108 07/31/2015 0404   CL 105 07/09/2012 0828   CL 102 09/09/2011 0917   CO2 22 07/31/2015 0404   CO2 25 01/08/2013 0919   CO2 28 09/09/2011 0917   GLUCOSE 106* 07/31/2015 0404   GLUCOSE 99 01/08/2013 0919   GLUCOSE 94 07/09/2012 0828   GLUCOSE 102 09/09/2011 0917   BUN 20 07/31/2015 0404   BUN 25.1 01/08/2013 0919   BUN 23* 09/09/2011 0917   CREATININE 0.90 07/31/2015 0404   CREATININE 1.1 01/08/2013 0919   CREATININE 1.3* 09/09/2011 0917   CALCIUM 8.2* 07/31/2015 0404   CALCIUM 9.6 01/08/2013 0919   CALCIUM 8.9 09/09/2011 0917   GFRNONAA >60 07/31/2015 0404   GFRAA >60 07/31/2015 0404     Assessment/Plan: 1 Day Post-Op   Principal Problem:   Primary osteoarthritis of left knee   WBAT with walker eliquis 2.5 mg PO BID in house, home on 5 mg PO BID (baseline) PO pain control PT/OT D/C HV drain and foley D/C home tomorrow with HHPT   Kallon Caylor, Horald Pollen 07/31/2015, 6:53 AM   Rod Can, MD Cell [phone]

## 2015-07-31 NOTE — Care Management Note (Signed)
Case Management Note  Patient Details  Name: Jason [name] MRN: [id number] Date of Birth: 1928-01-22  Subjective/Objective:         Left total knee arthroplasty           Action/Plan: Discharge Planning:  NCM spoke to pt and states [name] and [name] will be at home to assist him at home. Pt states he has RW and bedside commode at home. Offered choice for HH/provided list. Pt agreeable to [facility] for [facility]. Will continue to follow for dc needs.   Expected Discharge Date:  08/01/2015             Expected Discharge Plan:  [place]  In-House Referral:  NA  Discharge planning Services  CM Consult  Post Acute Care Choice:  Home Health Choice offered to:  Patient  DME Arranged:  N/A DME Agency:  NA  HH Arranged:  PT HH Agency:  [place]  Status of Service:  Completed, signed off  Medicare Important Message Given:    Date Medicare IM Given:    Medicare IM give by:    Date Additional Medicare IM Given:    Additional Medicare Important Message give by:     If discussed at [place] of Stay Meetings, dates discussed:    Additional Comments:  Erenest Rasher, RN 07/31/2015, 11:45 AM

## 2015-07-31 NOTE — Discharge Summary (Signed)
Physician Discharge Summary  Patient ID: Jason [name] MRN: [id number] DOB/AGE: 1927-12-27 82 y.o.  Admit date: 07/30/2015 Discharge date: 08/01/15  Admission Diagnoses:  Primary osteoarthritis of left knee  Discharge Diagnoses:  Principal Problem:   Primary osteoarthritis of left knee   Past Medical History  Diagnosis Date  . Hyperlipidemia   . GERD (gastroesophageal reflux disease)   . Recurrent cold sores   . Personal history of colonic polyps 07/15/2010    tubular adenoma  . Barrett esophagus   . PUD (peptic ulcer disease)   . Hiatal hernia   . Diverticulosis of colon (without mention of hemorrhage)   . Multiple nodules of lung 09/16/2011  . Stomach cancer ([place])     NHL origin  . Bladder cancer ([place]) dx'd [id number]    surg only  . Non Hodgkin's lymphoma ([place]) dx'd 01/03/2010  . Prostate cancer [facility])     patient denies  . Colon polyps     adenomatous  . Dysrhythmia     INTERMITANT A FIB  . Arthritis   . History of skin cancer     BASAL CELL  . History of bladder cancer 1981    EXCISION ONLY  . Difficulty sleeping     OCCASIONALLY  . Lymphoma, non-Hodgkin's ([place]) 2011    [place]   . Frequency of urination   . Macular degeneration     LEFT EYE    Surgeries: Procedure(s): LEFT TOTAL KNEE ARTHROPLASTY WITH COMPUTER NAVIGATION on 07/30/2015   Consultants (if any):    Discharged Condition: Improved  Hospital Course: Jason [name] is an 82 y.o. male who was admitted 07/30/2015 with a diagnosis of Primary osteoarthritis of left knee and went to the operating room on 07/30/2015 and underwent the above named procedures.    He was given perioperative antibiotics:  Anti-infectives    Start     Dose/Rate Route Frequency Ordered Stop   07/30/15 1930  ceFAZolin (ANCEF) IVPB 2g/100 mL premix     2 g 200 mL/hr over 30 Minutes Intravenous Every 6 hours 07/30/15 1735 07/31/15 0225   07/30/15 1042  ceFAZolin (ANCEF) IVPB 2g/100 mL premix     2 g 200 mL/hr over 30  Minutes Intravenous On call to O.R. 07/30/15 1042 07/30/15 1333    .  He was given sequential compression devices, early ambulation, and eliquis for DVT prophylaxis.  He benefited maximally from the hospital stay and there were no complications.    Recent vital signs:  Filed Vitals:   07/31/15 0134 07/31/15 0500  BP: 149/61 144/54  Pulse: 70 72  Temp: 97.8 F (36.6 C) 98.8 F (37.1 C)  Resp: 18 18    Recent laboratory studies:  Lab Results  Component Value Date   HGB 11.4* 07/31/2015   HGB 13.2 07/21/2015   HGB 13.0 01/17/2014   Lab Results  Component Value Date   WBC 10.8* 07/31/2015   PLT 157 07/31/2015   Lab Results  Component Value Date   INR 1.27 07/30/2015   Lab Results  Component Value Date   NA 139 07/31/2015   K 3.8 07/31/2015   CL 108 07/31/2015   CO2 22 07/31/2015   BUN 20 07/31/2015   CREATININE 0.90 07/31/2015   GLUCOSE 106* 07/31/2015    Discharge Medications:     Medication List    STOP taking these medications        cephALEXin 500 MG capsule  Commonly known as:  KEFLEX  traMADol 50 MG tablet  Commonly known as:  ULTRAM      TAKE these medications        clorazepate 3.75 MG tablet  Commonly known as:  TRANXENE  Take 3.75 mg by mouth at bedtime as needed for anxiety or sleep.     docusate sodium 100 MG capsule  Commonly known as:  COLACE  Take 1 capsule (100 mg total) by mouth 2 (two) times daily.     ELIQUIS 5 MG Tabs tablet  Generic drug:  apixaban  Take 5 mg by mouth 2 (two) times daily.     esomeprazole 40 MG capsule  Commonly known as:  NEXIUM  Take 1 capsule (40 mg total) by mouth 2 (two) times daily.     fish oil-omega-3 fatty acids 1000 MG capsule  Take 1 g by mouth 2 (two) times daily.     HYDROcodone-acetaminophen 5-325 MG tablet  Commonly known as:  NORCO  Take 1-2 tablets by mouth every 4 (four) hours as needed for moderate pain.     ICAPS MV PO  Take by mouth 2 (two) times daily.     ondansetron 4 MG  tablet  Commonly known as:  ZOFRAN  Take 1 tablet (4 mg total) by mouth every 6 (six) hours as needed for nausea.     polyethylene glycol packet  Commonly known as:  MIRALAX / GLYCOLAX  Take 17 g by mouth daily.     senna 8.6 MG Tabs tablet  Commonly known as:  SENOKOT  Take 2 tablets (17.2 mg total) by mouth at bedtime.     simvastatin 40 MG tablet  Commonly known as:  ZOCOR  Take 40 mg by mouth at bedtime.     SYSTANE ULTRA 0.4-0.3 % Soln  Generic drug:  Polyethyl Glycol-Propyl Glycol  Apply 1 drop to eye as needed (for dry eyes).     tamsulosin 0.4 MG Caps capsule  Commonly known as:  FLOMAX  Take 0.4 mg by mouth at bedtime.     traZODone 50 MG tablet  Commonly known as:  DESYREL  Take 50 mg by mouth at bedtime as needed for sleep.        Diagnostic Studies: Dg Knee Left Port  07/30/2015  CLINICAL DATA:  Status post left knee replacement EXAM: PORTABLE LEFT KNEE - 1-2 VIEW COMPARISON:  12/26/2003 FINDINGS: Left knee replacement is noted. Surgical drain is noted in place. No acute bony abnormality is seen. IMPRESSION: Status post left knee replacement.  No acute abnormality is noted. These results were called by telephone at the time of interpretation on 07/30/2015 at 4:59 pm to Kathlee Nations, the pts nurse who verbally acknowledged these results. Electronically Signed   By: Inez Catalina M.D.   On: 07/30/2015 17:00    Disposition: 01-Home or Self Care        Follow-up Information    Follow up with [place], Horald Pollen, MD. Schedule an appointment as soon as possible for a visit in 2 weeks.   Specialty:  Orthopedic Surgery   Why:  For wound re-check   Contact information:   [address] [address] [phone]        Signed: Elie Goody 07/31/2015, 6:59 AM

## 2015-07-31 NOTE — Evaluation (Signed)
Physical Therapy Evaluation Patient Details Name: Jason [name] MRN: [id number] DOB: 01-29-1928 Today's Date: 07/31/2015   History of Present Illness  s/p L TKA  Clinical Impression  Pt s/p L TKR presents with decreased L LE strength/ROM and post op pain limiting functional mobility.  Pt should progress to dc home with HHPT follow up and 24/7 family assist    Follow Up Recommendations Home health PT    Equipment Recommendations  None recommended by PT    Recommendations for Other Services OT consult     Precautions / Restrictions Precautions Precautions: Fall;Knee Restrictions Weight Bearing Restrictions: No Other Position/Activity Restrictions: WBAT      Mobility  Bed Mobility Overal bed mobility: Needs Assistance Bed Mobility: Supine to Sit     Supine to sit: Mod assist     General bed mobility comments: Pt OOB with OT and declines back to bed  Transfers Overall transfer level: Needs assistance Equipment used: Rolling walker (2 wheeled) Transfers: Sit to/from Stand Sit to Stand: Min assist Stand pivot transfers: Min assist       General transfer comment: cues for transition position and use of UEs to self assist  Ambulation/Gait Ambulation/Gait assistance: Min assist Ambulation Distance (Feet): 38 Feet Assistive device: Rolling walker (2 wheeled) Gait Pattern/deviations: Step-to pattern;Decreased step length - right;Decreased step length - left;Shuffle;Trunk flexed Gait velocity: decr   General Gait Details: cues for sequence, posture and position from [employer]            Wheelchair Mobility    Modified Rankin (Stroke Patients Only)       Balance                                             Pertinent Vitals/Pain Pain Assessment: 0-10 Pain Score: 5  Faces Pain Scale: Hurts whole lot (with standing; pt had difficulty rating pain) Pain Location: L knee Pain Descriptors / Indicators: Aching;Sore Pain  Intervention(s): Limited activity within patient's tolerance;Monitored during session;Premedicated before session;Ice applied    Home Living Family/patient expects to be discharged to:: Private residence Living Arrangements: Alone Available Help at Discharge: Family;Available 24 hours/day Type of Home: House Home Access: Stairs to enter   [employer] of Steps: 1 Home Layout: One level Home Equipment: Walker - 2 wheels;Bedside commode      Prior Function Level of Independence: Independent               Hand Dominance        Extremity/Trunk Assessment   Upper Extremity Assessment: Overall WFL for tasks assessed           Lower Extremity Assessment: LLE deficits/detail   LLE Deficits / Details: 3/5 quads with IND SLR and AAROM at knee -10 - 75  Cervical / Trunk Assessment: Kyphotic  Communication   Communication: HOH  Cognition Arousal/Alertness: Awake/alert Behavior During Therapy: WFL for tasks assessed/performed Overall Cognitive Status: Within Functional Limits for tasks assessed                      General Comments      Exercises Total Joint Exercises Ankle Circles/Pumps: AROM;Both;15 reps;Supine Quad Sets: AROM;Both;10 reps;Supine Heel Slides: AAROM;Left;15 reps;Supine Straight Leg Raises: AAROM;AROM;Left;10 reps;Supine      Assessment/Plan    PT Assessment Patient needs continued PT services  PT Diagnosis Difficulty walking   PT Problem  List Decreased strength;Decreased range of motion;Decreased activity tolerance;Decreased mobility;Pain;Obesity;Decreased knowledge of use of DME  PT Treatment Interventions DME instruction;Gait training;Stair training;Functional mobility training;Therapeutic activities;Therapeutic exercise;Patient/family education   PT Goals (Current goals can be found in the Care Plan section) Acute Rehab PT Goals Patient Stated Goal: return to independence PT Goal Formulation: With patient Potential to  Achieve Goals: Good    Frequency 7X/week   Barriers to discharge        Co-evaluation               End of Session Equipment Utilized During Treatment: Gait belt Activity Tolerance: Patient tolerated treatment well Patient left: in chair;with call bell/phone within reach;with chair alarm set;with family/visitor present Nurse Communication: Mobility status         Time: [id number] PT Time Calculation (min) (ACUTE ONLY): 38 min   Charges:   PT Evaluation $PT Eval Low Complexity: 1 Procedure PT Treatments $Gait Training: 8-22 mins $Therapeutic Exercise: 8-22 mins   PT G Codes:        Tu Shimmel 08-17-2015, 12:26 PM

## 2015-07-31 NOTE — Progress Notes (Signed)
Physical Therapy Treatment Patient Details Name: ALBA SPANGLER MRN: [id number] DOB: May 29, 1927 Today's Date: 17-Aug-2015    History of Present Illness s/p L TKA    PT Comments    Pt motivated and progressing steadily with mobility.    Follow Up Recommendations  Home health PT     Equipment Recommendations  None recommended by PT    Recommendations for Other Services OT consult     Precautions / Restrictions Precautions Precautions: Fall;Knee Restrictions Weight Bearing Restrictions: No Other Position/Activity Restrictions: WBAT    Mobility  Bed Mobility Overal bed mobility: Needs Assistance Bed Mobility: Sit to Supine       Sit to supine: Min assist   General bed mobility comments: Pt OOB with OT and declines back to bed  Transfers Overall transfer level: Needs assistance Equipment used: Rolling walker (2 wheeled) Transfers: Sit to/from Stand Sit to Stand: Min assist         General transfer comment: cues for transition position and use of UEs to self assist  Ambulation/Gait Ambulation/Gait assistance: Min assist Ambulation Distance (Feet): 58 Feet Assistive device: Rolling walker (2 wheeled) Gait Pattern/deviations: Step-to pattern;Step-through pattern;Decreased step length - right;Decreased step length - left;Shuffle;Trunk flexed Gait velocity: decr   General Gait Details: cues for sequence, posture and position from [employer]            Wheelchair Mobility    Modified Rankin (Stroke Patients Only)       Balance                                    Cognition Arousal/Alertness: Awake/alert Behavior During Therapy: WFL for tasks assessed/performed Overall Cognitive Status: Within Functional Limits for tasks assessed                      Exercises Total Joint Exercises Ankle Circles/Pumps: AROM;Both;15 reps;Supine Quad Sets: AROM;Both;10 reps;Supine Heel Slides: AAROM;Left;15 reps;Supine Straight Leg  Raises: AAROM;AROM;Left;10 reps;Supine    General Comments        Pertinent Vitals/Pain Pain Assessment: 0-10 Pain Score: 5  Pain Location: L knee Pain Descriptors / Indicators: Aching;Sore Pain Intervention(s): Limited activity within patient's tolerance;Monitored during session;Premedicated before session;Ice applied    Home Living                      Prior Function            PT Goals (current goals can now be found in the care plan section) Acute Rehab PT Goals Patient Stated Goal: return to independence PT Goal Formulation: With patient Potential to Achieve Goals: Good Progress towards PT goals: Progressing toward goals    Frequency  7X/week    PT Plan Current plan remains appropriate    Co-evaluation             End of Session Equipment Utilized During Treatment: Gait belt Activity Tolerance: Patient tolerated treatment well Patient left: in bed;with call bell/phone within reach;with family/visitor present     Time: [id number] PT Time Calculation (min) (ACUTE ONLY): 27 min  Charges:  $Gait Training: 8-22 mins $Therapeutic Exercise: 8-22 mins                    G Codes:      Manvir Thorson 17-Aug-2015, 4:35 PM

## 2015-07-31 NOTE — Evaluation (Signed)
Occupational Therapy Evaluation Patient Details Name: Jason [name] MRN: [id number] DOB: July 29, 1927 Today's Date: 07/31/2015    History of Present Illness s/p L TKA   Clinical Impression   This 82 year old man was admitted for the above sx.  He will benefit from continued OT in acute setting, and goals are for supervision to min guard assist overall. Pt was independent with adls prior to admission    Follow Up Recommendations  Supervision/Assistance - 24 hour    Equipment Recommendations  None recommended by OT    Recommendations for Other Services       Precautions / Restrictions Precautions Precautions: Fall;Knee Restrictions Weight Bearing Restrictions: No      Mobility Bed Mobility Overal bed mobility: Needs Assistance Bed Mobility: Supine to Sit     Supine to sit: Mod assist     General bed mobility comments: assist for LLE and trunk; HOB raised and utilized bed pad to assist to EOB  Transfers Overall transfer level: Needs assistance Equipment used: Rolling walker (2 wheeled) Transfers: Sit to/from [employer] Sit to Stand: Min assist Stand pivot transfers: Min assist       General transfer comment: from elevated bed; assist to rise and steady.  Cues for UE/LE placement    Balance                                            ADL Overall ADL's : Needs assistance/impaired     Grooming: Set up;Sitting   Upper Body Bathing: Set up;Sitting   Lower Body Bathing: Moderate assistance;Sit to/from stand   Upper Body Dressing : Set up;Sitting   Lower Body Dressing: Moderate assistance;Sit to/from stand   Toilet Transfer: Minimal assistance;Stand-pivot;RW (to recliner)   Toileting- Clothing Manipulation and Hygiene: Minimal assistance;Sit to/from stand         General ADL Comments: pt stood to use urinal and had difficulty maintaining urinal in position resulting in a spill.  He has a Secondary school teacher that he uses at home  for outside. Daughter present and will get him one for ADLs.  Pt will have 24/7 between daughter and son-in-law     Vision     Perception     Praxis      Pertinent Vitals/Pain Pain Assessment: Faces Faces Pain Scale: Hurts whole lot (with standing; pt had difficulty rating pain) Pain Location: left knee Pain Descriptors / Indicators: Aching Pain Intervention(s): Limited activity within patient's tolerance;Monitored during session;Premedicated before session;Repositioned (ice left in room for after PT)     Hand Dominance     Extremity/Trunk Assessment Upper Extremity Assessment Upper Extremity Assessment: Overall WFL for tasks assessed           Communication Communication Communication: HOH   Cognition Arousal/Alertness: Awake/alert Behavior During Therapy: WFL for tasks assessed/performed Overall Cognitive Status: Within Functional Limits for tasks assessed                     General Comments       Exercises       Shoulder Instructions      Home Living Family/patient expects to be discharged to:: Private residence Living Arrangements: Alone Available Help at Discharge: Family;Available 24 hours/day               Bathroom Shower/Tub: Occupational psychologist: Standard     Home  Equipment: Gilford Rile - 2 wheels;Bedside commode          Prior Functioning/Environment Level of Independence: Independent             OT Diagnosis: Acute pain   OT Problem List: Decreased strength;Decreased activity tolerance;Decreased knowledge of use of DME or AE;Pain   OT Treatment/Interventions: Self-care/ADL training;DME and/or AE instruction;Patient/family education    OT Goals(Current goals can be found in the care plan section) Acute Rehab OT Goals Patient Stated Goal: return to independence OT Goal Formulation: With patient Time For Goal Achievement: 08/07/15 Potential to Achieve Goals: Good ADL Goals Pt Will Perform Grooming: with  supervision;standing Pt Will Perform Lower Body Bathing: with supervision;with adaptive equipment;sit to/from stand Pt Will Perform Lower Body Dressing: with supervision;with adaptive equipment;sit to/from stand (pants/reacher) Pt Will Transfer to Toilet: with supervision;ambulating;bedside commode Pt Will Perform Toileting - Clothing Manipulation and hygiene: with supervision;sit to/from stand Pt Will Perform Tub/Shower Transfer: Shower transfer;with min guard assist;3 in 1;ambulating  OT Frequency: Min 2X/week   Barriers to D/C:            Co-evaluation              End of Session    Activity Tolerance: Patient tolerated treatment well Patient left: in chair;with call bell/phone within reach;with chair alarm set;with family/visitor present (and PT)   Time: [id number] OT Time Calculation (min): 27 min Charges:  OT General Charges $OT Visit: 1 Procedure OT Evaluation $OT Eval Low Complexity: 1 Procedure OT Treatments $Self Care/Home Management : 8-22 mins G-Codes:    Romano Stigger 2015/08/11, 10:38 AM Lesle Chris, OTR/L [phone] 08-11-15

## 2015-08-01 LAB — CBC
HEMATOCRIT: 37.2 % — AB (ref 39.0–52.0)
HEMOGLOBIN: 12.2 g/dL — AB (ref 13.0–17.0)
MCH: 28.8 pg (ref 26.0–34.0)
MCHC: 32.8 g/dL (ref 30.0–36.0)
MCV: 87.7 fL (ref 78.0–100.0)
Platelets: 170 10*3/uL (ref 150–400)
RBC: 4.24 MIL/uL (ref 4.22–5.81)
RDW: 13.1 % (ref 11.5–15.5)
WBC: 18.1 10*3/uL — AB (ref 4.0–10.5)

## 2015-08-01 NOTE — Progress Notes (Signed)
Occupational Therapy Treatment Patient Details Name: [name] MRN: [id number] DOB: 03/02/1928 Today's Date: 08/01/2015    History of present illness s/p L TKA   OT comments  All education completed this session  Follow Up Recommendations  Supervision/Assistance - 24 hour    Equipment Recommendations  None recommended by OT    Recommendations for Other Services      Precautions / Restrictions Precautions Precautions: Fall;Knee Restrictions Weight Bearing Restrictions: No Other Position/Activity Restrictions: WBAT       Mobility Bed Mobility               General bed mobility comments: oob  Transfers   Equipment used: Rolling walker (2 wheeled) Transfers: Sit to/from Stand Sit to Stand: Min assist         General transfer comment: steadying assistance; cues for UE/LE placement    Balance                                   ADL Overall ADL's : Needs assistance/impaired                     Lower Body Dressing: Minimal assistance;Sit to/from stand;With adaptive equipment (pants with reacher)                 General ADL Comments: pt had completed bathing task prior to OT's arrival.  He practiced with reacher for underwear and 2 pairs of shorts.  He had difficulty maneuvering reacher and needs additional practice, if he decides to use this.  Family may just choose to assist him.  Pt needed safety cues when standing and pulling pants over hips  Recommend one hand at a time.  I also recommended he wear elastic waist shorts for comfort and increased independence.  First pair of shorts had button/zipper and belt.  He needed assistance to secure button and this was a lot of fasteners to manage.  He is almost able to reach without AE himself.  Simulated shower transfer with min guard assist; ledge in pt's room was higher and thicker than his at home (3 tiles thick).  Family present during session      Vision                      Perception     Praxis      Cognition   Behavior During Therapy: WFL for tasks assessed/performed Overall Cognitive Status: Within Functional Limits for tasks assessed                       Extremity/Trunk Assessment               Exercises     Shoulder Instructions       General Comments      Pertinent Vitals/ Pain       Pain Assessment: Faces Pain Score: 2  (a little) Faces Pain Scale: Hurts a little bit Pain Location: L knee Pain Descriptors / Indicators: Sore Pain Intervention(s): Limited activity within patient's tolerance;Monitored during session;Repositioned;Ice applied  Home Living                                          Prior Functioning/Environment  Frequency       Progress Toward Goals  OT Goals(current goals can now be found in the care plan section)  Progress towards OT goals: Progressing toward goals (goals not met, but no further OT is needed; education done)  Acute Rehab OT Goals Patient Stated Goal: return to independence  Plan      Co-evaluation                 End of Session     Activity Tolerance Patient tolerated treatment well   Patient Left in chair;with call bell/phone within reach;with chair alarm set;with family/visitor present   Nurse Communication          Time: 1026-1050 OT Time Calculation (min): 24 min  Charges: OT General Charges $OT Visit: 1 Procedure OT Treatments $Self Care/Home Management : 23-37 mins  Mavin Dyke 08/01/2015, 11:34 AM  Lesle Chris, OTR/L [phone] 08/01/2015

## 2015-08-01 NOTE — Progress Notes (Signed)
Physical Therapy Treatment Patient Details Name: QUARAN QUERTERMOUS MRN: [id number] DOB: 1927/09/22 Today's Date: 2015/08/14    History of Present Illness s/p L TKA    PT Comments    Steady progress with mobility.  Pt eager for dc home this pm.  Follow Up Recommendations  Home health PT     Equipment Recommendations  None recommended by PT    Recommendations for Other Services OT consult     Precautions / Restrictions Precautions Precautions: Fall;Knee Restrictions Weight Bearing Restrictions: No Other Position/Activity Restrictions: WBAT    Mobility  Bed Mobility               General bed mobility comments: NT - OOB with nursing and declines back to bed  Transfers Overall transfer level: Needs assistance Equipment used: Rolling walker (2 wheeled) Transfers: Sit to/from Stand Sit to Stand: Min guard         General transfer comment: cues for LE management and use of UEs to self assist  Ambulation/Gait Ambulation/Gait assistance: Min assist;Min guard Ambulation Distance (Feet): 180 Feet Assistive device: Rolling walker (2 wheeled) Gait Pattern/deviations: Step-to pattern;Step-through pattern;Decreased step length - right;Decreased step length - left;Shuffle;Trunk flexed Gait velocity: decr   General Gait Details: cues for sequence, posture and position from [employer]            Wheelchair Mobility    Modified Rankin (Stroke Patients Only)       Balance                                    Cognition Arousal/Alertness: Awake/alert Behavior During Therapy: WFL for tasks assessed/performed Overall Cognitive Status: Within Functional Limits for tasks assessed                      Exercises Total Joint Exercises Ankle Circles/Pumps: AROM;Both;15 reps;Supine Quad Sets: AROM;Both;Supine;15 reps Heel Slides: AAROM;Left;Supine;20 reps Straight Leg Raises: AAROM;AROM;Left;Supine;20 reps Long Arc Quad: AAROM;AROM;Left;10  reps;Seated Goniometric ROM: AAROM L knee -10 - 75    General Comments        Pertinent Vitals/Pain Pain Assessment: 0-10 Pain Score: 4  Faces Pain Scale: Hurts a little bit Pain Location: L knee Pain Descriptors / Indicators: Aching;Sore Pain Intervention(s): Limited activity within patient's tolerance;Monitored during session;Premedicated before session;Ice applied    Home Living                      Prior Function            PT Goals (current goals can now be found in the care plan section) Acute Rehab PT Goals Patient Stated Goal: return to independence PT Goal Formulation: With patient Potential to Achieve Goals: Good Progress towards PT goals: Progressing toward goals    Frequency  7X/week    PT Plan Current plan remains appropriate    Co-evaluation             End of Session Equipment Utilized During Treatment: Gait belt Activity Tolerance: Patient tolerated treatment well Patient left: in chair;with call bell/phone within reach;with family/visitor present     Time: [id number] PT Time Calculation (min) (ACUTE ONLY): 31 min  Charges:  $Gait Training: 8-22 mins $Therapeutic Exercise: 8-22 mins                    G Codes:      Jaquil Todt 08-14-2015, 12:26 PM

## 2015-08-01 NOTE — Progress Notes (Signed)
Physical Therapy Treatment Patient Details Name: JOSEY SCHOFF MRN: [id number] DOB: 1928-03-08 Today's Date: 08/01/2015    History of Present Illness s/p L TKA    PT Comments    Steady progress and eager for return home.  Reviewed therex and stairs with pt and family.  Follow Up Recommendations  Home health PT     Equipment Recommendations  None recommended by PT    Recommendations for Other Services OT consult     Precautions / Restrictions Precautions Precautions: Fall;Knee Restrictions Weight Bearing Restrictions: No Other Position/Activity Restrictions: WBAT    Mobility  Bed Mobility Overal bed mobility: Needs Assistance Bed Mobility: Sit to Supine;Supine to Sit     Supine to sit: Min assist Sit to supine: Min assist   General bed mobility comments: cues for sequence and use of R LE to self assist  Transfers Overall transfer level: Needs assistance Equipment used: Rolling walker (2 wheeled) Transfers: Sit to/from Stand Sit to Stand: Supervision         General transfer comment: cues for LE management and use of UEs to self assist  Ambulation/Gait Ambulation/Gait assistance: Min guard;Supervision Ambulation Distance (Feet): 100 Feet Assistive device: Rolling walker (2 wheeled) Gait Pattern/deviations: Step-to pattern;Decreased step length - right;Decreased step length - left;Shuffle;Trunk flexed Gait velocity: decr   General Gait Details: cues for sequence, posture and position from RW   Stairs Stairs: Yes Stairs assistance: Min assist Stair Management: No rails;Step to pattern;Forwards;With walker Number of Stairs: 3 General stair comments: single step x 3 with RW and cues for sequence and foot/RW placement  Wheelchair Mobility    Modified Rankin (Stroke Patients Only)       Balance                                    Cognition Arousal/Alertness: Awake/alert Behavior During Therapy: WFL for tasks  assessed/performed Overall Cognitive Status: Within Functional Limits for tasks assessed                      Exercises Total Joint Exercises Ankle Circles/Pumps: AROM;Both;15 reps;Supine Quad Sets: AROM;Both;Supine;15 reps Heel Slides: AAROM;Left;Supine;20 reps Straight Leg Raises: AAROM;AROM;Left;Supine;15 reps Long Arc Quad: AAROM;AROM;Left;10 reps;Seated Goniometric ROM: AAROM L knee -10 - 75    General Comments        Pertinent Vitals/Pain Pain Assessment: 0-10 Pain Score: 4  Faces Pain Scale: Hurts a little bit Pain Location: L knee Pain Descriptors / Indicators: Aching;Sore Pain Intervention(s): Limited activity within patient's tolerance;Monitored during session;Premedicated before session    Home Living                      Prior Function            PT Goals (current goals can now be found in the care plan section) Acute Rehab PT Goals Patient Stated Goal: return to independence PT Goal Formulation: With patient Potential to Achieve Goals: Good Progress towards PT goals: Progressing toward goals    Frequency  7X/week    PT Plan Current plan remains appropriate    Co-evaluation             End of Session Equipment Utilized During Treatment: Gait belt Activity Tolerance: Patient tolerated treatment well Patient left: in chair;with family/visitor present     Time: [id number] PT Time Calculation (min) (ACUTE ONLY): 29 min  Charges:  $Gait Training: 8-22 mins $  Therapeutic Exercise: 8-22 mins                    G Codes:      Lalia Loudon 24-Aug-2015, 2:03 PM

## 2015-08-01 NOTE — Progress Notes (Signed)
Subjective: 2 Days Post-Op Procedure(s) (LRB): LEFT TOTAL KNEE ARTHROPLASTY WITH COMPUTER NAVIGATION (Left) Patient reports pain as mild to left knee.  Doing well. Tolertaing PO's well. Positive flatus. Progressing with PT. Denies SOB,CP, or calf pain.   Objective: Vital signs in last 24 hours: Temp:  [97.5 F (36.4 C)-99.2 F (37.3 C)] 97.5 F (36.4 C) (04/29 0620) Pulse Rate:  [72-75] 75 (04/29 0620) Resp:  [18] 18 (04/29 0620) BP: (126-143)/(47-70) 143/52 mmHg (04/29 0620) SpO2:  [94 %-98 %] 97 % (04/29 0620)  Intake/Output from previous day: 04/28 0701 - 04/29 0700 In: 1522.5 [P.O.:720; I.V.:802.5] Out: 1550 [Urine:1550] Intake/Output this shift:     Recent Labs  07/31/15 0404 08/01/15 0406  HGB 11.4* 12.2*    Recent Labs  07/31/15 0404 08/01/15 0406  WBC 10.8* 18.1*  RBC 3.91* 4.24  HCT 34.5* 37.2*  PLT 157 170    Recent Labs  07/31/15 0404  NA 139  K 3.8  CL 108  CO2 22  BUN 20  CREATININE 0.90  GLUCOSE 106*  CALCIUM 8.2*    Recent Labs  07/30/15 1120  INR 1.27    Alert and oriented x3. RRR, Lungs clear, BS x4. Left Calf soft and non tender. L knee dressing C/D/I. No DVT signs. No signs of infection or compartment syndrome. LLE grossly neurovascularly intact.   Assessment/Plan: 2 Days Post-Op Procedure(s) (LRB): LEFT TOTAL KNEE ARTHROPLASTY WITH COMPUTER NAVIGATION (Left) D./c home HHPT Up with PT Follow instructions Take medications as directed Apixaban 5 mg bid   Jason [name] L 08/01/2015, 8:07 AM

## 2015-08-02 DIAGNOSIS — Z96652 Presence of left artificial knee joint: Secondary | ICD-10-CM | POA: Diagnosis not present

## 2015-08-02 DIAGNOSIS — Z7902 Long term (current) use of antithrombotics/antiplatelets: Secondary | ICD-10-CM | POA: Diagnosis not present

## 2015-08-02 DIAGNOSIS — Z87891 Personal history of nicotine dependence: Secondary | ICD-10-CM | POA: Diagnosis not present

## 2015-08-02 DIAGNOSIS — Z6834 Body mass index (BMI) 34.0-34.9, adult: Secondary | ICD-10-CM | POA: Diagnosis not present

## 2015-08-02 DIAGNOSIS — Z471 Aftercare following joint replacement surgery: Secondary | ICD-10-CM | POA: Diagnosis not present

## 2015-08-04 DIAGNOSIS — Z7902 Long term (current) use of antithrombotics/antiplatelets: Secondary | ICD-10-CM | POA: Diagnosis not present

## 2015-08-04 DIAGNOSIS — Z96652 Presence of left artificial knee joint: Secondary | ICD-10-CM | POA: Diagnosis not present

## 2015-08-04 DIAGNOSIS — Z87891 Personal history of nicotine dependence: Secondary | ICD-10-CM | POA: Diagnosis not present

## 2015-08-04 DIAGNOSIS — Z6834 Body mass index (BMI) 34.0-34.9, adult: Secondary | ICD-10-CM | POA: Diagnosis not present

## 2015-08-04 DIAGNOSIS — Z471 Aftercare following joint replacement surgery: Secondary | ICD-10-CM | POA: Diagnosis not present

## 2015-08-06 DIAGNOSIS — Z96652 Presence of left artificial knee joint: Secondary | ICD-10-CM | POA: Diagnosis not present

## 2015-08-06 DIAGNOSIS — Z87891 Personal history of nicotine dependence: Secondary | ICD-10-CM | POA: Diagnosis not present

## 2015-08-06 DIAGNOSIS — Z6834 Body mass index (BMI) 34.0-34.9, adult: Secondary | ICD-10-CM | POA: Diagnosis not present

## 2015-08-06 DIAGNOSIS — Z471 Aftercare following joint replacement surgery: Secondary | ICD-10-CM | POA: Diagnosis not present

## 2015-08-06 DIAGNOSIS — Z7902 Long term (current) use of antithrombotics/antiplatelets: Secondary | ICD-10-CM | POA: Diagnosis not present

## 2015-08-10 DIAGNOSIS — Z96652 Presence of left artificial knee joint: Secondary | ICD-10-CM | POA: Diagnosis not present

## 2015-08-10 DIAGNOSIS — Z6834 Body mass index (BMI) 34.0-34.9, adult: Secondary | ICD-10-CM | POA: Diagnosis not present

## 2015-08-10 DIAGNOSIS — Z87891 Personal history of nicotine dependence: Secondary | ICD-10-CM | POA: Diagnosis not present

## 2015-08-10 DIAGNOSIS — Z471 Aftercare following joint replacement surgery: Secondary | ICD-10-CM | POA: Diagnosis not present

## 2015-08-10 DIAGNOSIS — Z7902 Long term (current) use of antithrombotics/antiplatelets: Secondary | ICD-10-CM | POA: Diagnosis not present

## 2015-08-13 DIAGNOSIS — Z87891 Personal history of nicotine dependence: Secondary | ICD-10-CM | POA: Diagnosis not present

## 2015-08-13 DIAGNOSIS — Z471 Aftercare following joint replacement surgery: Secondary | ICD-10-CM | POA: Diagnosis not present

## 2015-08-13 DIAGNOSIS — Z7902 Long term (current) use of antithrombotics/antiplatelets: Secondary | ICD-10-CM | POA: Diagnosis not present

## 2015-08-13 DIAGNOSIS — Z6834 Body mass index (BMI) 34.0-34.9, adult: Secondary | ICD-10-CM | POA: Diagnosis not present

## 2015-08-13 DIAGNOSIS — Z96652 Presence of left artificial knee joint: Secondary | ICD-10-CM | POA: Diagnosis not present

## 2015-08-14 DIAGNOSIS — Z471 Aftercare following joint replacement surgery: Secondary | ICD-10-CM | POA: Diagnosis not present

## 2015-08-14 DIAGNOSIS — Z96652 Presence of left artificial knee joint: Secondary | ICD-10-CM | POA: Diagnosis not present

## 2015-08-17 DIAGNOSIS — M1712 Unilateral primary osteoarthritis, left knee: Secondary | ICD-10-CM | POA: Diagnosis not present

## 2015-08-19 DIAGNOSIS — M1712 Unilateral primary osteoarthritis, left knee: Secondary | ICD-10-CM | POA: Diagnosis not present

## 2015-08-21 DIAGNOSIS — M1712 Unilateral primary osteoarthritis, left knee: Secondary | ICD-10-CM | POA: Diagnosis not present

## 2015-08-24 DIAGNOSIS — M1712 Unilateral primary osteoarthritis, left knee: Secondary | ICD-10-CM | POA: Diagnosis not present

## 2015-08-26 DIAGNOSIS — M1712 Unilateral primary osteoarthritis, left knee: Secondary | ICD-10-CM | POA: Diagnosis not present

## 2015-08-28 DIAGNOSIS — M1712 Unilateral primary osteoarthritis, left knee: Secondary | ICD-10-CM | POA: Diagnosis not present

## 2015-09-01 DIAGNOSIS — H353221 Exudative age-related macular degeneration, left eye, with active choroidal neovascularization: Secondary | ICD-10-CM | POA: Diagnosis not present

## 2015-09-02 DIAGNOSIS — M1712 Unilateral primary osteoarthritis, left knee: Secondary | ICD-10-CM | POA: Diagnosis not present

## 2015-09-04 DIAGNOSIS — M1712 Unilateral primary osteoarthritis, left knee: Secondary | ICD-10-CM | POA: Diagnosis not present

## 2015-09-08 DIAGNOSIS — M1712 Unilateral primary osteoarthritis, left knee: Secondary | ICD-10-CM | POA: Diagnosis not present

## 2015-09-10 DIAGNOSIS — M1712 Unilateral primary osteoarthritis, left knee: Secondary | ICD-10-CM | POA: Diagnosis not present

## 2015-09-14 DIAGNOSIS — Z96652 Presence of left artificial knee joint: Secondary | ICD-10-CM | POA: Diagnosis not present

## 2015-09-14 DIAGNOSIS — Z471 Aftercare following joint replacement surgery: Secondary | ICD-10-CM | POA: Diagnosis not present

## 2015-10-07 ENCOUNTER — Other Ambulatory Visit: Payer: Self-pay | Admitting: Internal Medicine

## 2015-10-12 DIAGNOSIS — H353211 Exudative age-related macular degeneration, right eye, with active choroidal neovascularization: Secondary | ICD-10-CM | POA: Diagnosis not present

## 2015-11-02 ENCOUNTER — Encounter (HOSPITAL_COMMUNITY): Payer: Self-pay | Admitting: Emergency Medicine

## 2015-11-02 ENCOUNTER — Emergency Department (HOSPITAL_COMMUNITY): Payer: Medicare Other

## 2015-11-02 ENCOUNTER — Emergency Department (HOSPITAL_COMMUNITY)
Admission: EM | Admit: 2015-11-02 | Discharge: 2015-11-02 | Disposition: A | Discharge status: Home / Self Care | Payer: Medicare Other | Attending: Emergency Medicine | Admitting: Emergency Medicine

## 2015-11-02 DIAGNOSIS — Z87891 Personal history of nicotine dependence: Secondary | ICD-10-CM | POA: Diagnosis not present

## 2015-11-02 DIAGNOSIS — Z85828 Personal history of other malignant neoplasm of skin: Secondary | ICD-10-CM | POA: Diagnosis not present

## 2015-11-02 DIAGNOSIS — Z85028 Personal history of other malignant neoplasm of stomach: Secondary | ICD-10-CM | POA: Diagnosis not present

## 2015-11-02 DIAGNOSIS — Z8546 Personal history of malignant neoplasm of prostate: Secondary | ICD-10-CM | POA: Diagnosis not present

## 2015-11-02 DIAGNOSIS — Z79899 Other long term (current) drug therapy: Secondary | ICD-10-CM | POA: Diagnosis not present

## 2015-11-02 DIAGNOSIS — R1011 Right upper quadrant pain: Secondary | ICD-10-CM | POA: Diagnosis not present

## 2015-11-02 DIAGNOSIS — K6389 Other specified diseases of intestine: Secondary | ICD-10-CM | POA: Diagnosis not present

## 2015-11-02 DIAGNOSIS — Z8551 Personal history of malignant neoplasm of bladder: Secondary | ICD-10-CM | POA: Diagnosis not present

## 2015-11-02 DIAGNOSIS — R1030 Lower abdominal pain, unspecified: Secondary | ICD-10-CM | POA: Diagnosis not present

## 2015-11-02 DIAGNOSIS — R109 Unspecified abdominal pain: Secondary | ICD-10-CM

## 2015-11-02 LAB — COMPREHENSIVE METABOLIC PANEL
ALBUMIN: 3.7 g/dL (ref 3.5–5.0)
ALK PHOS: 37 U/L — AB (ref 38–126)
ALT: 9 U/L — AB (ref 17–63)
AST: 20 U/L (ref 15–41)
Anion gap: 8 (ref 5–15)
BILIRUBIN TOTAL: 0.8 mg/dL (ref 0.3–1.2)
BUN: 24 mg/dL — AB (ref 6–20)
CALCIUM: 9 mg/dL (ref 8.9–10.3)
CO2: 22 mmol/L (ref 22–32)
CREATININE: 1.14 mg/dL (ref 0.61–1.24)
Chloride: 107 mmol/L (ref 101–111)
GFR calc Af Amer: >60 mL/min (ref >60)
GFR calc non Af Amer: 57 mL/min — ABNORMAL LOW (ref >60)
GLUCOSE: 134 mg/dL — AB (ref 65–99)
Potassium: 3.9 mmol/L (ref 3.5–5.1)
Sodium: 137 mmol/L (ref 135–145)
TOTAL PROTEIN: 7.3 g/dL (ref 6.5–8.1)

## 2015-11-02 LAB — CBC
HCT: 37.4 % — ABNORMAL LOW (ref 39.0–52.0)
Hemoglobin: 12.2 g/dL — ABNORMAL LOW (ref 13.0–17.0)
MCH: 28 pg (ref 26.0–34.0)
MCHC: 32.6 g/dL (ref 30.0–36.0)
MCV: 86 fL (ref 78.0–100.0)
Platelets: 241 10*3/uL (ref 150–400)
RBC: 4.35 MIL/uL (ref 4.22–5.81)
RDW: 12.9 % (ref 11.5–15.5)
WBC: 11.5 10*3/uL — ABNORMAL HIGH (ref 4.0–10.5)

## 2015-11-02 LAB — URINALYSIS, ROUTINE W REFLEX MICROSCOPIC
BILIRUBIN URINE: NEGATIVE
Glucose, UA: NEGATIVE mg/dL
HGB URINE DIPSTICK: NEGATIVE
KETONES UR: NEGATIVE mg/dL
NITRITE: NEGATIVE
Protein, ur: NEGATIVE mg/dL
Specific Gravity, Urine: 1.028 (ref 1.005–1.030)
pH: 6 (ref 5.0–8.0)

## 2015-11-02 LAB — URINE MICROSCOPIC-ADD ON

## 2015-11-02 LAB — LIPASE, BLOOD: Lipase: 39 U/L (ref 11–51)

## 2015-11-02 MED ORDER — ONDANSETRON HCL 4 MG/2ML IJ SOLN
4.0000 mg | Freq: Once | INTRAMUSCULAR | Status: AC
  Administered 2015-11-02: 4 mg via INTRAVENOUS
  Filled 2015-11-02: qty 2

## 2015-11-02 MED ORDER — IOPAMIDOL (ISOVUE-300) INJECTION 61%
100.0000 mL | Freq: Once | INTRAVENOUS | Status: AC | PRN
  Administered 2015-11-02: 100 mL via INTRAVENOUS

## 2015-11-02 MED ORDER — FENTANYL CITRATE (PF) 100 MCG/2ML IJ SOLN
100.0000 ug | Freq: Once | INTRAMUSCULAR | Status: AC
  Administered 2015-11-02: 100 ug via INTRAVENOUS
  Filled 2015-11-02: qty 2

## 2015-11-02 NOTE — ED Provider Notes (Signed)
[facility] DEPT Provider Note   CSN: [id number] Arrival date & time: 11/02/15  0126  First Provider Contact:  First MD Initiated Contact with Patient 11/02/15 [phone]        History   Chief Complaint Chief Complaint  Patient presents with  . Abdominal Pain    HPI Jason [name] is a 82 y.o. male this is an 82 year old male with a three-day history of abdominal pain. The abdominal pain is located below the umbilicus. The pain is sharp and episodic. It occurs several times an hour and lasts 30 seconds to a minute at a time. He describes it as severe. He has associated abdominal distention. He has a known right upper quadrant incisional hernia that is not presently symptomatic. He has had no nausea or vomiting. He had diarrhea 2 days ago but is currently constipated. He denies fever or chills. He denies dysuria.  HPI  Past Medical History:  Diagnosis Date  . Arthritis   . Barrett esophagus   . Bladder cancer ([place]) dx'd [id number]   surg only  . Colon polyps    adenomatous  . Difficulty sleeping    OCCASIONALLY  . Diverticulosis of colon (without mention of hemorrhage)   . Dysrhythmia    INTERMITANT A FIB  . Frequency of urination   . GERD (gastroesophageal reflux disease)   . Hiatal hernia   . History of bladder cancer 1981   EXCISION ONLY  . History of skin cancer    BASAL CELL  . Hyperlipidemia   . Lymphoma, non-Hodgkin's ([place]) 2011   [place]   . Macular degeneration    LEFT EYE  . Multiple nodules of lung 09/16/2011  . Non Hodgkin's lymphoma ([place]) dx'd 01/03/2010  . Personal history of colonic polyps 07/15/2010   tubular adenoma  . Prostate cancer [facility])    patient denies  . PUD (peptic ulcer disease)   . Recurrent cold sores   . Stomach cancer ([place])    [employer] origin    Patient Active Problem List   Diagnosis Date Noted  . Primary osteoarthritis of left knee 07/30/2015  . BPH (benign prostatic hyperplasia) 07/16/2012  . Multiple nodules of lung  09/16/2011  . GERD (gastroesophageal reflux disease) 06/14/2011  . Constipation, chronic 06/14/2011  . NHL (non-Hodgkin's lymphoma) ([place]) 01/03/2010  . BLADDER CANCER 05/28/2007  . HYPERLIPIDEMIA 05/28/2007  . HIATAL HERNIA 05/28/2007  . BARRETT'S ESOPHAGUS, HX OF 05/28/2007    Past Surgical History:  Procedure Laterality Date  . APPENDECTOMY    . bone spur     left shoulder  . CHOLECYSTECTOMY    . KNEE ARTHROPLASTY Left 07/30/2015   Procedure: LEFT TOTAL KNEE ARTHROPLASTY WITH COMPUTER NAVIGATION;  Surgeon: Rod Can, MD;  Location: [facility] ORS;  Service: Orthopedics;  Laterality: Left;  . LASER OF PROSTATE W/ GREEN LIGHT PVP    . TENDON REPAIR     right arm  . TRIGGER FINGER RELEASE     bilateral hands  . WRIST SURGERY     Bilateral       Home Medications    Prior to Admission medications   Medication Sig Start Date End Date Taking? Authorizing Provider  apixaban (ELIQUIS) 5 MG TABS tablet Take 5 mg by mouth 2 (two) times daily.   Yes Historical Provider, MD  docusate sodium (COLACE) 100 MG capsule Take 1 capsule (100 mg total) by mouth 2 (two) times daily. 07/31/15  Yes Rod Can, MD  esomeprazole (NEXIUM) 40 MG capsule Take 1  capsule (40 mg total) by mouth 2 (two) times daily before a meal. 10/07/15  Yes Irene Shipper, MD  fish oil-omega-3 fatty acids 1000 MG capsule Take 1 g by mouth 2 (two) times daily.    Yes Historical Provider, MD  HYDROcodone-acetaminophen (NORCO) 5-325 MG tablet Take 1-2 tablets by mouth every 4 (four) hours as needed for moderate pain. 07/31/15  Yes Rod Can, MD  Multiple Vitamins-Minerals (ICAPS MV PO) Take by mouth 2 (two) times daily.     Yes Historical Provider, MD  Polyethyl Glycol-Propyl Glycol (SYSTANE ULTRA) 0.4-0.3 % SOLN Apply 1 drop to eye as needed (for dry eyes).   Yes Historical Provider, MD  polyethylene glycol (MIRALAX / GLYCOLAX) packet Take 17 g by mouth daily.    Yes Historical Provider, MD  senna (SENOKOT) 8.6 MG TABS  tablet Take 2 tablets (17.2 mg total) by mouth at bedtime. 07/31/15  Yes Rod Can, MD  simvastatin (ZOCOR) 40 MG tablet Take 40 mg by mouth at bedtime.     Yes Historical Provider, MD  tamsulosin (FLOMAX) 0.4 MG CAPS capsule Take 0.4 mg by mouth at bedtime.   Yes Historical Provider, MD  traZODone (DESYREL) 50 MG tablet Take 50 mg by mouth at bedtime as needed for sleep.   Yes Historical Provider, MD  ondansetron (ZOFRAN) 4 MG tablet Take 1 tablet (4 mg total) by mouth every 6 (six) hours as needed for nausea. Patient not taking: Reported on 11/02/2015 07/31/15   Rod Can, MD    Family History Family History  Problem Relation Age of Onset  . Prostate cancer Father   . Bone cancer Brother   . Cancer Brother     back    Social History Social History  Substance Use Topics  . Smoking status: Former Smoker    Quit date: 10/29/1966  . Smokeless tobacco: Former Systems developer    Quit date: 06/29/1998  . Alcohol use No     Allergies   Review of patient's allergies indicates no known allergies.   Review of Systems Review of Systems   Physical Exam Updated Vital Signs BP 148/64   Pulse 81   Temp 98.4 F (36.9 C) (Oral)   Resp 20   SpO2 96%   Physical Exam General: Well-developed, well-nourished male in no acute distress; appearance consistent with age of record HENT: normocephalic; atraumatic Eyes: pupils equal, round and reactive to light; extraocular muscles intact; lens implants Neck: supple Heart: regular rate and rhythm Lungs: clear to auscultation bilaterally Abdomen: soft; distended; midabdominal tenderness; bowel sounds present Extremities: No deformity; full range of motion; pulses normal; trace edema of lower legs Neurologic: Awake, alert; motor function intact in all extremities and symmetric; no facial droop Skin: Warm and dry Psychiatric: Flat affect    ED Treatments / Results   Nursing notes and vitals signs, including pulse oximetry,  reviewed.  Summary of this visit's results, reviewed by myself:  Labs:  Results for orders placed or performed during the hospital encounter of 11/02/15 (from the past 24 hour(s))  Lipase, blood     Status: None   Collection Time: 11/02/15  2:27 AM  Result Value Ref Range   Lipase 39 11 - 51 U/L  Comprehensive metabolic panel     Status: Abnormal   Collection Time: 11/02/15  2:27 AM  Result Value Ref Range   Sodium 137 135 - 145 mmol/L   Potassium 3.9 3.5 - 5.1 mmol/L   Chloride 107 101 - 111 mmol/L  CO2 22 22 - 32 mmol/L   Glucose, Bld 134 (H) 65 - 99 mg/dL   BUN 24 (H) 6 - 20 mg/dL   Creatinine, Ser 1.14 0.61 - 1.24 mg/dL   Calcium 9.0 8.9 - 10.3 mg/dL   Total Protein 7.3 6.5 - 8.1 g/dL   Albumin 3.7 3.5 - 5.0 g/dL   AST 20 15 - 41 U/L   ALT 9 (L) 17 - 63 U/L   Alkaline Phosphatase 37 (L) 38 - 126 U/L   Total Bilirubin 0.8 0.3 - 1.2 mg/dL   GFR calc non Af Amer 57 (L) >60 mL/min   GFR calc Af Amer >60 >60 mL/min   Anion gap 8 5 - 15  CBC     Status: Abnormal   Collection Time: 11/02/15  2:27 AM  Result Value Ref Range   WBC 11.5 (H) 4.0 - 10.5 K/uL   RBC 4.35 4.22 - 5.81 MIL/uL   Hemoglobin 12.2 (L) 13.0 - 17.0 g/dL   HCT 37.4 (L) 39.0 - 52.0 %   MCV 86.0 78.0 - 100.0 fL   MCH 28.0 26.0 - 34.0 pg   MCHC 32.6 30.0 - 36.0 g/dL   RDW 12.9 11.5 - 15.5 %   Platelets 241 150 - 400 K/uL  Urinalysis, Routine w reflex microscopic     Status: Abnormal   Collection Time: 11/02/15  5:01 AM  Result Value Ref Range   Color, Urine AMBER (A) YELLOW   APPearance CLOUDY (A) CLEAR   Specific Gravity, Urine 1.028 1.005 - 1.030   pH 6.0 5.0 - 8.0   Glucose, UA NEGATIVE NEGATIVE mg/dL   Hgb urine dipstick NEGATIVE NEGATIVE   Bilirubin Urine NEGATIVE NEGATIVE   Ketones, ur NEGATIVE NEGATIVE mg/dL   Protein, ur NEGATIVE NEGATIVE mg/dL   Nitrite NEGATIVE NEGATIVE   Leukocytes, UA TRACE (A) NEGATIVE  Urine microscopic-add on     Status: Abnormal   Collection Time: 11/02/15  5:01 AM   Result Value Ref Range   Squamous Epithelial / LPF 0-5 (A) NONE SEEN   WBC, UA 0-5 0 - 5 WBC/hpf   RBC / HPF 0-5 0 - 5 RBC/hpf   Bacteria, UA RARE (A) NONE SEEN   Casts HYALINE CASTS (A) NEGATIVE   Urine-Other MUCOUS PRESENT     Imaging Studies: Ct Abdomen Pelvis W Contrast  Result Date: 11/02/2015 CLINICAL DATA:  82 year old male with lower abdominal pain EXAM: CT ABDOMEN AND PELVIS WITH CONTRAST TECHNIQUE: Multidetector CT imaging of the abdomen and pelvis was performed using the standard protocol following bolus administration of intravenous contrast. CONTRAST:  138mL ISOVUE-300 IOPAMIDOL (ISOVUE-300) INJECTION 61% COMPARISON:  CT dated 08/22/2014 FINDINGS: There is emphysematous changes of the lung bases with diffuse interstitial and interlobular septal coarsening, likely chronic. Superimposed pneumonia or edema is not excluded. Clinical correlation is recommended. A 4 mm right posterior lung base nodule and branch a 3 mm right middle lobe subpleural nodule appears stable compared to the prior study. There is coronary vascular calcification. No intra-abdominal free air or free fluid. Cholecystectomy. Mild fatty infiltration of the liver. The pancreas, spleen, adrenal glands, kidneys, visualized ureters, and urinary bladder appear unremarkable. The prostate and seminal vesicles are grossly unremarkable. Evaluation of the bowel is limited in the absence of oral contrast. There is thickened appearance of the gastric wall without inflammatory changes, likely related to underdistention. Gastritis is less likely. Clinical correlation is recommended. There is redundancy of the sigmoid colon. Moderate amount of stool and air noted  throughout the colon. There is no evidence of bowel obstruction for active inflammation. Appendectomy. There is moderate aortoiliac atherosclerotic disease. There is no aneurysmal dilatation or evidence of dissection. The IVC appears unremarkable. No portal venous gas identified.  There is hazy density extending along the roots of the mesentery and surrounding the distal branches of the superior mesenteric artery and vein with a focus of calcification similar to prior study. This is of indeterminate etiology and clinical significance but may be related to chronic infection/inflammation or represent fibrosis. There is no associated mass effect or splaying of the vessels. There is no adenopathy. The abdominal wall soft tissues appear unremarkable. There is osteopenia with degenerative changes of the spine. No acute fracture. IMPRESSION: No definite acute intra-abdominal or pelvic pathology. No evidence of mechanical bowel obstruction or active inflammation. Mild gaseous distention of the colon. An adynamic ileus is not excluded. Clinical correlation is recommended. Hazy stranding of the mesentery of indeterminate etiology and clinical significance also seen on the prior study, likely related to chronic inflammation/infection. No associated mass effect. Electronically Signed   By: Anner Crete M.D.   On: 11/02/2015 05:53   Procedures (including critical care time)   Final Clinical Impressions(s) / ED Diagnoses  7:16 AM The patient has been pain-free for several hours. He has family member were advised of laboratory and CT findings. His family member states he has been on MiraLAX, Senokot and Colace regularly since knee surgery in April. I suspect his symptoms are due to long-term laxative use. He was advised to wean off the Senokot and rely more on MiraLAX as it is osmotic and does directly effect colonic motility. They will contact his PCP today for further evaluation and treatment.  Final diagnoses:  Abdominal cramping      Shanon Rosser, MD 11/02/15 [phone]

## 2015-11-02 NOTE — ED Triage Notes (Signed)
Pt is c/o lower abd pain that started a couple of days ago  Pt states he has had some diarrhea for the past two days  Pt states he has no pain at this time but it comes and goes  Pt states it gets up to a 10 when it comes

## 2015-11-15 ENCOUNTER — Encounter (HOSPITAL_COMMUNITY): Payer: Self-pay | Admitting: Emergency Medicine

## 2015-11-15 ENCOUNTER — Emergency Department (HOSPITAL_COMMUNITY): Payer: Medicare Other

## 2015-11-15 ENCOUNTER — Emergency Department (HOSPITAL_COMMUNITY)
Admission: EM | Admit: 2015-11-15 | Discharge: 2015-11-15 | Disposition: A | Discharge status: Home / Self Care | Payer: Medicare Other | Attending: Emergency Medicine | Admitting: Emergency Medicine

## 2015-11-15 DIAGNOSIS — S80212A Abrasion, left knee, initial encounter: Secondary | ICD-10-CM | POA: Insufficient documentation

## 2015-11-15 DIAGNOSIS — Z23 Encounter for immunization: Secondary | ICD-10-CM | POA: Insufficient documentation

## 2015-11-15 DIAGNOSIS — W19XXXA Unspecified fall, initial encounter: Secondary | ICD-10-CM

## 2015-11-15 DIAGNOSIS — Z96652 Presence of left artificial knee joint: Secondary | ICD-10-CM | POA: Insufficient documentation

## 2015-11-15 DIAGNOSIS — Z7901 Long term (current) use of anticoagulants: Secondary | ICD-10-CM | POA: Diagnosis not present

## 2015-11-15 DIAGNOSIS — S0031XA Abrasion of nose, initial encounter: Secondary | ICD-10-CM | POA: Diagnosis not present

## 2015-11-15 DIAGNOSIS — S00511A Abrasion of lip, initial encounter: Secondary | ICD-10-CM | POA: Insufficient documentation

## 2015-11-15 DIAGNOSIS — Z85028 Personal history of other malignant neoplasm of stomach: Secondary | ICD-10-CM | POA: Diagnosis not present

## 2015-11-15 DIAGNOSIS — S60511A Abrasion of right hand, initial encounter: Secondary | ICD-10-CM | POA: Insufficient documentation

## 2015-11-15 DIAGNOSIS — S60512A Abrasion of left hand, initial encounter: Secondary | ICD-10-CM | POA: Insufficient documentation

## 2015-11-15 DIAGNOSIS — W0110XA Fall on same level from slipping, tripping and stumbling with subsequent striking against unspecified object, initial encounter: Secondary | ICD-10-CM | POA: Insufficient documentation

## 2015-11-15 DIAGNOSIS — R030 Elevated blood-pressure reading, without diagnosis of hypertension: Secondary | ICD-10-CM | POA: Diagnosis not present

## 2015-11-15 DIAGNOSIS — Y999 Unspecified external cause status: Secondary | ICD-10-CM | POA: Insufficient documentation

## 2015-11-15 DIAGNOSIS — Z85828 Personal history of other malignant neoplasm of skin: Secondary | ICD-10-CM | POA: Diagnosis not present

## 2015-11-15 DIAGNOSIS — Y92194 Driveway of other specified residential institution as the place of occurrence of the external cause: Secondary | ICD-10-CM | POA: Insufficient documentation

## 2015-11-15 DIAGNOSIS — S0990XA Unspecified injury of head, initial encounter: Secondary | ICD-10-CM | POA: Insufficient documentation

## 2015-11-15 DIAGNOSIS — Z79899 Other long term (current) drug therapy: Secondary | ICD-10-CM | POA: Insufficient documentation

## 2015-11-15 DIAGNOSIS — S80211A Abrasion, right knee, initial encounter: Secondary | ICD-10-CM | POA: Diagnosis not present

## 2015-11-15 DIAGNOSIS — Z8546 Personal history of malignant neoplasm of prostate: Secondary | ICD-10-CM | POA: Insufficient documentation

## 2015-11-15 DIAGNOSIS — Z8551 Personal history of malignant neoplasm of bladder: Secondary | ICD-10-CM | POA: Insufficient documentation

## 2015-11-15 DIAGNOSIS — Y939 Activity, unspecified: Secondary | ICD-10-CM | POA: Diagnosis not present

## 2015-11-15 DIAGNOSIS — Z87891 Personal history of nicotine dependence: Secondary | ICD-10-CM | POA: Diagnosis not present

## 2015-11-15 DIAGNOSIS — S0181XA Laceration without foreign body of other part of head, initial encounter: Secondary | ICD-10-CM | POA: Diagnosis not present

## 2015-11-15 DIAGNOSIS — S0081XA Abrasion of other part of head, initial encounter: Secondary | ICD-10-CM | POA: Diagnosis not present

## 2015-11-15 DIAGNOSIS — R51 Headache: Secondary | ICD-10-CM | POA: Diagnosis not present

## 2015-11-15 MED ORDER — BACITRACIN ZINC 500 UNIT/GM EX OINT: TOPICAL_OINTMENT | Freq: Once | CUTANEOUS | Status: DC

## 2015-11-15 MED ORDER — TETANUS-DIPHTH-ACELL PERTUSSIS 5-2.5-18.5 LF-MCG/0.5 IM SUSP
0.5000 mL | Freq: Once | INTRAMUSCULAR | Status: AC
  Administered 2015-11-15: 0.5 mL via INTRAMUSCULAR
  Filled 2015-11-15: qty 0.5

## 2015-11-15 NOTE — Discharge Instructions (Signed)
Use your walker at all times to prevent falls. Take Tylenol as directed for pain. Wash your wounds daily with soap and water and place a thin layer of bacitracin ointment over the wounds and then cover with a sterile bandage. Signs of infection include more pain, redness around the wounds, drainage from the wounds, fever. Return or see your primary care physician if you feel that you may be developing an infection. Get your blood pressure recheck within the next one or 2 weeks. Today's was elevated at 161/73.

## 2015-11-15 NOTE — ED Notes (Signed)
Pt ambulatory to restroom with unsteady gait and 2 person assist. Pt sts "I am wobbly."

## 2015-11-15 NOTE — ED Notes (Signed)
Pt and pts family understood dc material. NAD noted

## 2015-11-15 NOTE — ED Triage Notes (Signed)
Pt here with fall in the driveway. Pt denies LOC, neck pain, back pain. Pt with laceration to forehead, epistaxis, multiple skin tears to hands and arms. Pt a/o x 4. Pt takes eliquis.

## 2015-11-15 NOTE — ED Provider Notes (Addendum)
[facility] DEPT Provider Note   CSN: [id number] Arrival date & time: 11/15/15  1637  First Provider Contact:  First MD Initiated Contact with Patient 11/15/15 1650        History   Chief Complaint Chief Complaint  Patient presents with  . Fall  . Head Laceration    HPI Jason [name] is a 82 y.o. male. Patient tripped and fell while walking on his driveway [id number] PM today striking his face. No loss of consciousness. He felt well prior to the event. He suffered multiple abrasions as result fall. He denies neck pain no other associated symptoms. No treatment prior to coming here. Pain is minimal  HPI  Past Medical History:  Diagnosis Date  . Arthritis   . Barrett esophagus   . Bladder cancer ([place]) dx'd [id number]   surg only  . Colon polyps    adenomatous  . Difficulty sleeping    OCCASIONALLY  . Diverticulosis of colon (without mention of hemorrhage)   . Dysrhythmia    INTERMITANT A FIB  . Frequency of urination   . GERD (gastroesophageal reflux disease)   . Hiatal hernia   . History of bladder cancer 1981   EXCISION ONLY  . History of skin cancer    BASAL CELL  . Hyperlipidemia   . Lymphoma, non-Hodgkin's ([place]) 2011   [place]   . Macular degeneration    LEFT EYE  . Multiple nodules of lung 09/16/2011  . Non Hodgkin's lymphoma ([place]) dx'd 01/03/2010  . Personal history of colonic polyps 07/15/2010   tubular adenoma  . Prostate cancer [facility])    patient denies  . PUD (peptic ulcer disease)   . Recurrent cold sores   . Stomach cancer ([place])    NHL origin   Atrial fibrillation Patient Active Problem List   Diagnosis Date Noted  . Primary osteoarthritis of left knee 07/30/2015  . BPH (benign prostatic hyperplasia) 07/16/2012  . Multiple nodules of lung 09/16/2011  . GERD (gastroesophageal reflux disease) 06/14/2011  . Constipation, chronic 06/14/2011  . NHL (non-Hodgkin's lymphoma) ([place]) 01/03/2010  . BLADDER CANCER 05/28/2007  . HYPERLIPIDEMIA  05/28/2007  . HIATAL HERNIA 05/28/2007  . BARRETT'S ESOPHAGUS, HX OF 05/28/2007    Past Surgical History:  Procedure Laterality Date  . APPENDECTOMY    . bone spur     left shoulder  . CHOLECYSTECTOMY    . KNEE ARTHROPLASTY Left 07/30/2015   Procedure: LEFT TOTAL KNEE ARTHROPLASTY WITH COMPUTER NAVIGATION;  Surgeon: Rod Can, MD;  Location: [facility] ORS;  Service: Orthopedics;  Laterality: Left;  . LASER OF PROSTATE W/ GREEN LIGHT PVP    . TENDON REPAIR     right arm  . TRIGGER FINGER RELEASE     bilateral hands  . WRIST SURGERY     Bilateral       Home Medications    Prior to Admission medications   Medication Sig Start Date End Date Taking? Authorizing Provider  apixaban (ELIQUIS) 5 MG TABS tablet Take 5 mg by mouth 2 (two) times daily.    Historical Provider, MD  docusate sodium (COLACE) 100 MG capsule Take 1 capsule (100 mg total) by mouth 2 (two) times daily. 07/31/15   Rod Can, MD  esomeprazole (NEXIUM) 40 MG capsule Take 1 capsule (40 mg total) by mouth 2 (two) times daily before a meal. 10/07/15   Irene Shipper, MD  fish oil-omega-3 fatty acids 1000 MG capsule Take 1 g by mouth 2 (two) times daily.  Historical Provider, MD  HYDROcodone-acetaminophen (NORCO) 5-325 MG tablet Take 1-2 tablets by mouth every 4 (four) hours as needed for moderate pain. 07/31/15   Rod Can, MD  Multiple Vitamins-Minerals (ICAPS MV PO) Take by mouth 2 (two) times daily.      Historical Provider, MD  ondansetron (ZOFRAN) 4 MG tablet Take 1 tablet (4 mg total) by mouth every 6 (six) hours as needed for nausea. Patient not taking: Reported on 11/02/2015 07/31/15   Rod Can, MD  Polyethyl Glycol-Propyl Glycol (SYSTANE ULTRA) 0.4-0.3 % SOLN Apply 1 drop to eye as needed (for dry eyes).    Historical Provider, MD  polyethylene glycol (MIRALAX / GLYCOLAX) packet Take 17 g by mouth daily.     Historical Provider, MD  senna (SENOKOT) 8.6 MG TABS tablet Take 2 tablets (17.2 mg total) by  mouth at bedtime. 07/31/15   Rod Can, MD  simvastatin (ZOCOR) 40 MG tablet Take 40 mg by mouth at bedtime.      Historical Provider, MD  tamsulosin (FLOMAX) 0.4 MG CAPS capsule Take 0.4 mg by mouth at bedtime.    Historical Provider, MD  traZODone (DESYREL) 50 MG tablet Take 50 mg by mouth at bedtime as needed for sleep.    Historical Provider, MD    Family History Family History  Problem Relation Age of Onset  . Prostate cancer Father   . Bone cancer Brother   . Cancer Brother     back    Social History Social History  Substance Use Topics  . Smoking status: Former Smoker    Quit date: 10/29/1966  . Smokeless tobacco: Former Systems developer    Quit date: 06/29/1998  . Alcohol use No     Allergies   Review of patient's allergies indicates no known allergies.   Review of Systems Review of Systems  Constitutional: Negative.   HENT: Negative.   Respiratory: Negative.   Cardiovascular: Negative for chest pain.       Syncope  Gastrointestinal: Negative.   Musculoskeletal: Positive for gait problem.       Walks with walker  Skin: Positive for wound.       Multiple abrasions  Allergic/Immunologic: Negative.        Last tetanus immunization unknown  Psychiatric/Behavioral: Negative.   All other systems reviewed and are negative.    Physical Exam Updated Vital Signs BP 165/74 (BP Location: Right Arm)   Pulse 80   Temp 98 F (36.7 C) (Oral)   Resp 16   Ht 5\' 10"  (1.778 m)   Wt 230 lb (104.3 kg)   SpO2 98%   BMI 33.00 kg/m   Physical Exam  Constitutional: He is oriented to person, place, and time. He appears well-developed and well-nourished. No distress.  HENT:  Right Ear: External ear normal.  Left Ear: External ear normal.  Abrasions to forehead, nose and upper lip. Teeth intact. Otherwise normocephalic atraumatic. No septal hematoma  Eyes: Conjunctivae are normal. Pupils are equal, round, and reactive to light.  Neck: Neck supple. No tracheal deviation present.  No thyromegaly present.  Cardiovascular: Normal rate, regular rhythm, normal heart sounds and intact distal pulses.   No murmur heard. Pulmonary/Chest: Effort normal and breath sounds normal. He exhibits no tenderness.  Abdominal: Soft. Bowel sounds are normal. He exhibits no distension. There is no tenderness.  Obese  Musculoskeletal: Normal range of motion. He exhibits no edema, tenderness or deformity.  All 4 extremities without deformity or swelling or bony tenderness neurovascularly intact  Neurological:  He is alert and oriented to person, place, and time. Coordination normal.  Glasgow Coma Score 15 motor strength 5 over 5 overall  Skin: Skin is warm and dry. No rash noted.  Abrasions over dorsum of both hands, and both anterior knees.  Psychiatric: He has a normal mood and affect.  Nursing note and vitals reviewed.    ED Treatments / Results  Labs (all labs ordered are listed, but only abnormal results are displayed) Labs Reviewed - No data to display Results for orders placed or performed during the hospital encounter of 11/02/15  Lipase, blood  Result Value Ref Range   Lipase 39 11 - 51 U/L  Comprehensive metabolic panel  Result Value Ref Range   Sodium 137 135 - 145 mmol/L   Potassium 3.9 3.5 - 5.1 mmol/L   Chloride 107 101 - 111 mmol/L   CO2 22 22 - 32 mmol/L   Glucose, Bld 134 (H) 65 - 99 mg/dL   BUN 24 (H) 6 - 20 mg/dL   Creatinine, Ser 1.14 0.61 - 1.24 mg/dL   Calcium 9.0 8.9 - 10.3 mg/dL   Total Protein 7.3 6.5 - 8.1 g/dL   Albumin 3.7 3.5 - 5.0 g/dL   AST 20 15 - 41 U/L   ALT 9 (L) 17 - 63 U/L   Alkaline Phosphatase 37 (L) 38 - 126 U/L   Total Bilirubin 0.8 0.3 - 1.2 mg/dL   GFR calc non Af Amer 57 (L) >60 mL/min   GFR calc Af Amer >60 >60 mL/min   Anion gap 8 5 - 15  CBC  Result Value Ref Range   WBC 11.5 (H) 4.0 - 10.5 K/uL   RBC 4.35 4.22 - 5.81 MIL/uL   Hemoglobin 12.2 (L) 13.0 - 17.0 g/dL   HCT 37.4 (L) 39.0 - 52.0 %   MCV 86.0 78.0 - 100.0 fL    MCH 28.0 26.0 - 34.0 pg   MCHC 32.6 30.0 - 36.0 g/dL   RDW 12.9 11.5 - 15.5 %   Platelets 241 150 - 400 K/uL  Urinalysis, Routine w reflex microscopic  Result Value Ref Range   Color, Urine AMBER (A) YELLOW   APPearance CLOUDY (A) CLEAR   Specific Gravity, Urine 1.028 1.005 - 1.030   pH 6.0 5.0 - 8.0   Glucose, UA NEGATIVE NEGATIVE mg/dL   Hgb urine dipstick NEGATIVE NEGATIVE   Bilirubin Urine NEGATIVE NEGATIVE   Ketones, ur NEGATIVE NEGATIVE mg/dL   Protein, ur NEGATIVE NEGATIVE mg/dL   Nitrite NEGATIVE NEGATIVE   Leukocytes, UA TRACE (A) NEGATIVE  Urine microscopic-add on  Result Value Ref Range   Squamous Epithelial / LPF 0-5 (A) NONE SEEN   WBC, UA 0-5 0 - 5 WBC/hpf   RBC / HPF 0-5 0 - 5 RBC/hpf   Bacteria, UA RARE (A) NONE SEEN   Casts HYALINE CASTS (A) NEGATIVE   Urine-Other MUCOUS PRESENT    Ct Head Wo Contrast  Result Date: 11/15/2015 CLINICAL DATA:  [place] face first onto concrete DKA, headache, facial lacerations, on blood thinners, history gastric cancer, bladder cancer, non-Hodgkin's lymphoma, prostate cancer, former smoker EXAM: CT HEAD WITHOUT CONTRAST CT MAXILLOFACIAL WITHOUT CONTRAST TECHNIQUE: Multidetector CT imaging of the head and maxillofacial structures were performed using the standard protocol without intravenous contrast. Multiplanar CT image reconstructions of the maxillofacial structures were also generated. Right side of face marked with BB. COMPARISON:  CT head 01/04/2010 FINDINGS: CT HEAD FINDINGS Generalized atrophy. Normal ventricular morphology. No midline shift or  mass effect. Small vessel chronic ischemic changes of deep cerebral white matter. Small old appearing RIGHT basal ganglia lacunar infarct though new since 2011. No intracranial hemorrhage, mass lesion, or acute infarction. Calvaria intact. Atherosclerotic calcification of internal carotid and vertebral arteries at skullbase. CT MAXILLOFACIAL FINDINGS Intraorbital soft tissue planes clear.  Visualized intracranial structures normal appearance. Small frontal scalp hematoma. Scattered beam hardening artifacts of dental origin. Mild osseous demineralization. Mucosal thickening LEFT maxillary sinus with mucosal retention cyst RIGHT maxillary sinus. TMJ alignment normal. Nasal septal deviation to the RIGHT anteriorly. No facial bone fractures identified. Periodontal lucencies at multiple maxillary teeth bilaterally. Symmetric parotid and submandibular glands. Atherosclerotic calcifications at the carotid bifurcations. IMPRESSION: Atrophy with small vessel chronic ischemic changes of deep cerebral white matter. Old RIGHT basal ganglia lacunar infarct. No acute intracranial abnormalities. No acute facial bone abnormalities. Scattered periodontal disease. Electronically Signed   By: Lavonia Dana M.D.   On: 11/15/2015 17:56   Ct Abdomen Pelvis W Contrast  Result Date: 11/02/2015 CLINICAL DATA:  82 year old male with lower abdominal pain EXAM: CT ABDOMEN AND PELVIS WITH CONTRAST TECHNIQUE: Multidetector CT imaging of the abdomen and pelvis was performed using the standard protocol following bolus administration of intravenous contrast. CONTRAST:  132mL ISOVUE-300 IOPAMIDOL (ISOVUE-300) INJECTION 61% COMPARISON:  CT dated 08/22/2014 FINDINGS: There is emphysematous changes of the lung bases with diffuse interstitial and interlobular septal coarsening, likely chronic. Superimposed pneumonia or edema is not excluded. Clinical correlation is recommended. A 4 mm right posterior lung base nodule and branch a 3 mm right middle lobe subpleural nodule appears stable compared to the prior study. There is coronary vascular calcification. No intra-abdominal free air or free fluid. Cholecystectomy. Mild fatty infiltration of the liver. The pancreas, spleen, adrenal glands, kidneys, visualized ureters, and urinary bladder appear unremarkable. The prostate and seminal vesicles are grossly unremarkable. Evaluation of the  bowel is limited in the absence of oral contrast. There is thickened appearance of the gastric wall without inflammatory changes, likely related to underdistention. Gastritis is less likely. Clinical correlation is recommended. There is redundancy of the sigmoid colon. Moderate amount of stool and air noted throughout the colon. There is no evidence of bowel obstruction for active inflammation. Appendectomy. There is moderate aortoiliac atherosclerotic disease. There is no aneurysmal dilatation or evidence of dissection. The IVC appears unremarkable. No portal venous gas identified. There is hazy density extending along the roots of the mesentery and surrounding the distal branches of the superior mesenteric artery and vein with a focus of calcification similar to prior study. This is of indeterminate etiology and clinical significance but may be related to chronic infection/inflammation or represent fibrosis. There is no associated mass effect or splaying of the vessels. There is no adenopathy. The abdominal wall soft tissues appear unremarkable. There is osteopenia with degenerative changes of the spine. No acute fracture. IMPRESSION: No definite acute intra-abdominal or pelvic pathology. No evidence of mechanical bowel obstruction or active inflammation. Mild gaseous distention of the colon. An adynamic ileus is not excluded. Clinical correlation is recommended. Hazy stranding of the mesentery of indeterminate etiology and clinical significance also seen on the prior study, likely related to chronic inflammation/infection. No associated mass effect. Electronically Signed   By: Anner Crete M.D.   On: 11/02/2015 05:53  Ct Maxillofacial Wo Contrast  Result Date: 11/15/2015 CLINICAL DATA:  [place] face first onto concrete DKA, headache, facial lacerations, on blood thinners, history gastric cancer, bladder cancer, non-Hodgkin's lymphoma, prostate cancer, former smoker EXAM: CT HEAD  WITHOUT CONTRAST CT  MAXILLOFACIAL WITHOUT CONTRAST TECHNIQUE: Multidetector CT imaging of the head and maxillofacial structures were performed using the standard protocol without intravenous contrast. Multiplanar CT image reconstructions of the maxillofacial structures were also generated. Right side of face marked with BB. COMPARISON:  CT head 01/04/2010 FINDINGS: CT HEAD FINDINGS Generalized atrophy. Normal ventricular morphology. No midline shift or mass effect. Small vessel chronic ischemic changes of deep cerebral white matter. Small old appearing RIGHT basal ganglia lacunar infarct though new since 2011. No intracranial hemorrhage, mass lesion, or acute infarction. Calvaria intact. Atherosclerotic calcification of internal carotid and vertebral arteries at skullbase. CT MAXILLOFACIAL FINDINGS Intraorbital soft tissue planes clear. Visualized intracranial structures normal appearance. Small frontal scalp hematoma. Scattered beam hardening artifacts of dental origin. Mild osseous demineralization. Mucosal thickening LEFT maxillary sinus with mucosal retention cyst RIGHT maxillary sinus. TMJ alignment normal. Nasal septal deviation to the RIGHT anteriorly. No facial bone fractures identified. Periodontal lucencies at multiple maxillary teeth bilaterally. Symmetric parotid and submandibular glands. Atherosclerotic calcifications at the carotid bifurcations. IMPRESSION: Atrophy with small vessel chronic ischemic changes of deep cerebral white matter. Old RIGHT basal ganglia lacunar infarct. No acute intracranial abnormalities. No acute facial bone abnormalities. Scattered periodontal disease. Electronically Signed   By: Lavonia Dana M.D.   On: 11/15/2015 17:56   EKG  EKG Interpretation None       Radiology No results found.  Procedures Procedures (including critical care time)  Medications Ordered in ED Medications  Tdap (BOOSTRIX) injection 0.5 mL (not administered)     Initial Impression / Assessment and Plan /  ED Course  I have reviewed the triage vital signs and the nursing notes.  Pertinent labs & imaging results that were available during my care of the patient were reviewed by me and considered in my medical decision making (see chart for details).  Clinical Course   7:50 PM patient is alert and ambulates with walker without difficulty. No distress Glasgow Coma Score 15. Feels ready to go home   CT scan of head and maxillofacial ordered as patient is elderly, fell and struck head Plan: Local wound care. Encouraged use walker at all times. Blood pressure recheck one to 2 weeks. Tylenol for pain Final Clinical Impressions(s) / ED Diagnoses  Diagnoses #1 fall #2 minor closed head trauma #3 abrasions multiple sites #4 elevated blood pressure Final diagnoses:  None    New Prescriptions New Prescriptions   No medications on file     Orlie Dakin, MD 11/15/15 [id number]    Orlie Dakin, MD 11/15/15 [id number]

## 2015-12-01 DIAGNOSIS — H353221 Exudative age-related macular degeneration, left eye, with active choroidal neovascularization: Secondary | ICD-10-CM | POA: Diagnosis not present

## 2015-12-22 DIAGNOSIS — I480 Paroxysmal atrial fibrillation: Secondary | ICD-10-CM | POA: Diagnosis not present

## 2015-12-22 DIAGNOSIS — E785 Hyperlipidemia, unspecified: Secondary | ICD-10-CM | POA: Diagnosis not present

## 2015-12-22 DIAGNOSIS — E668 Other obesity: Secondary | ICD-10-CM | POA: Diagnosis not present

## 2015-12-22 DIAGNOSIS — Z7901 Long term (current) use of anticoagulants: Secondary | ICD-10-CM | POA: Diagnosis not present

## 2015-12-24 ENCOUNTER — Encounter (INDEPENDENT_AMBULATORY_CARE_PROVIDER_SITE_OTHER): Payer: Self-pay

## 2015-12-24 ENCOUNTER — Encounter: Payer: Self-pay | Admitting: Internal Medicine

## 2015-12-24 ENCOUNTER — Ambulatory Visit (INDEPENDENT_AMBULATORY_CARE_PROVIDER_SITE_OTHER): Payer: Medicare Other | Admitting: Internal Medicine

## 2015-12-24 VITALS — BP 128/66 | HR 60 | Ht 70.0 in | Wt 226.6 lb

## 2015-12-24 DIAGNOSIS — K117 Disturbances of salivary secretion: Secondary | ICD-10-CM | POA: Diagnosis not present

## 2015-12-24 DIAGNOSIS — R1310 Dysphagia, unspecified: Secondary | ICD-10-CM | POA: Diagnosis not present

## 2015-12-24 NOTE — Patient Instructions (Signed)
You have been scheduled for a Barium Esophogram at [facility] (1st floor of the hospital) on Tuesday 12/29/15 at 1:30 pm. Please arrive 15 minutes prior to your appointment for registration. Make certain not to have anything to eat or drink 6 hours prior to your test. If you need to reschedule for any reason, please contact radiology at [phone] to do so. __________________________________________________________________ A barium swallow is an examination that concentrates on views of the esophagus. This tends to be a double contrast exam (barium and two liquids which, when combined, create a gas to distend the wall of the oesophagus) or single contrast (non-ionic iodine based). The study is usually tailored to your symptoms so a good history is essential. Attention is paid during the study to the form, structure and configuration of the esophagus, looking for functional disorders (such as aspiration, dysphagia, achalasia, motility and reflux) EXAMINATION You may be asked to change into a gown, depending on the type of swallow being performed. A radiologist and radiographer will perform the procedure. The radiologist will advise you of the type of contrast selected for your procedure and direct you during the exam. You will be asked to stand, sit or lie in several different positions and to hold a small amount of fluid in your mouth before being asked to swallow while the imaging is performed .In some instances you may be asked to swallow barium coated marshmallows to assess the motility of a solid food bolus. The exam can be recorded as a digital or video fluoroscopy procedure. POST PROCEDURE It will take 1-2 days for the barium to pass through your system. To facilitate this, it is important, unless otherwise directed, to increase your fluids for the next 24-48hrs and to resume your normal diet.  This test typically takes about 30 minutes to  perform. __________________________________________________________________________________ If you are age 109 or older, your body mass index should be between 23-30. Your Body mass index is 32.51 kg/m. If this is out of the aforementioned range listed, please consider follow up with your Primary Care Provider.  If you are age 21 or younger, your body mass index should be between 19-25. Your Body mass index is 32.51 kg/m. If this is out of the aformentioned range listed, please consider follow up with your Primary Care Provider.

## 2015-12-24 NOTE — Progress Notes (Signed)
HISTORY OF PRESENT ILLNESS:  Jason [name] is a 82 y.o. male with multiple significant medical problems as listed below. He is referred today by his daughter with chief complaint of chronic drooling. His daughter accompanies him today. She reports that he has had at least a two-year history of constant chronic drooling out of the left side of his mouth. With without eating. Not sure why she brought him here. Has talked to her PCP about this. Has not seen a neurologist. Denies esophageal dysphagia but might have some transfer dysphagia. Does have a history of GERD, complicated by Barrett's esophagus. Previous patient of Dr. Verl Blalock. Last colonoscopy and upper endoscopy in 2012. Upper endoscopy revealed Barrett's esophagus without dysplasia. Colonoscopy revealed diminutive adenoma and diverticulosis. No surveillance for other problem at this point based on age and comorbidities. He denies reflux symptoms. Remains on chronic Nexium. Takes chronic anticoagulation in the form of Eliquis.  REVIEW OF SYSTEMS:  All non-GI ROS negative except for knee pain, arthritis, insomnia  Past Medical History:  Diagnosis Date  . Arthritis   . Barrett esophagus   . Bladder cancer ([place]) dx'd [id number]   surg only  . Colon polyps    adenomatous  . Difficulty sleeping    OCCASIONALLY  . Diverticulosis of colon (without mention of hemorrhage)   . Dysrhythmia    INTERMITANT A FIB  . Frequency of urination   . GERD (gastroesophageal reflux disease)   . Hiatal hernia   . History of bladder cancer 1981   EXCISION ONLY  . History of skin cancer    BASAL CELL  . Hyperlipidemia   . Lymphoma, non-Hodgkin's ([place]) 2011   [place]   . Macular degeneration    LEFT EYE  . Multiple nodules of lung 09/16/2011  . Non Hodgkin's lymphoma ([place]) dx'd 01/03/2010  . Personal history of colonic polyps 07/15/2010   tubular adenoma  . Prostate cancer [facility])    patient denies  . PUD (peptic ulcer disease)   .  Recurrent cold sores   . Stomach cancer ([place])    NHL origin    Past Surgical History:  Procedure Laterality Date  . APPENDECTOMY    . bone spur     left shoulder  . CHOLECYSTECTOMY    . KNEE ARTHROPLASTY Left 07/30/2015   Procedure: LEFT TOTAL KNEE ARTHROPLASTY WITH COMPUTER NAVIGATION;  Surgeon: Rod Can, MD;  Location: [facility];  Service: Orthopedics;  Laterality: Left;  . LASER OF PROSTATE W/ GREEN LIGHT PVP    . TENDON REPAIR     right arm  . TRIGGER FINGER RELEASE     bilateral hands  . WRIST SURGERY     Bilateral    Social History JAXZON KIESS  reports that he quit smoking about 49 years ago. He quit smokeless tobacco use about 17 years ago. He reports that he does not drink alcohol or use drugs.  family history includes Bone cancer in his brother; Cancer in his brother; Prostate cancer in his father.  No Known Allergies     PHYSICAL EXAMINATION: Vital signs: BP 128/66 (BP Location: Left Arm, Patient Position: Sitting, Cuff Size: Normal)   Pulse 60   Ht 5\' 10"  (1.778 m)   Wt 226 lb 9.6 oz (102.8 kg)   BMI 32.51 kg/m   Constitutional: Obese, elderly and fatigued-appearing, no acute distress Psychiatric: alert and oriented x3, cooperative. Slow speech Eyes: extraocular movements intact, anicteric, conjunctiva pink Mouth: oral pharynx moist, no lesions. Slight  droop of the left mouth Neck: supple without thyromegaly; no lymphadenopathy Cardiovascular: heart regular rate and rhythm, no murmur Lungs: clear to auscultation bilaterally Abdomen: soft, obese. nontender, nondistended, no obvious ascites, no peritoneal signs, normal bowel sounds, no organomegaly Rectal: Omitted Extremities: no clubbing, cyanosis or lower extremity edema bilaterally. Surgical incisions on these well-healed Skin: no lesions on visible extremities Neuro: No focal deficits. Cranial nerves appear grossly intact  ASSESSMENT:  #1. Chronic drooling. Not a primary GI disorder. Question  prior neurologic event. Stable for 2 years #2. GERD with history of Barrett's #3. Questionable vague dysphagia #4. History of colon polyps. Aged out of surveillance #5. Multiple significant medical problems and advanced age   PLAN:  #1. Continue PPI #2. Barium swallow to rule out significant abnormality #3. Return to PCP  25 minutes spent face-to-face with the patient and his daughter. Greater than 50% a time use for counseling regarding his complaints of drooling,'s prior history of GERD with Barrett's, and management plan moving forward

## 2015-12-29 ENCOUNTER — Ambulatory Visit (HOSPITAL_COMMUNITY)
Admission: RE | Admit: 2015-12-29 | Discharge: 2015-12-29 | Disposition: A | Discharge status: Home / Self Care | Payer: Medicare Other | Source: Ambulatory Visit | Attending: Internal Medicine | Admitting: Internal Medicine

## 2015-12-29 DIAGNOSIS — R1310 Dysphagia, unspecified: Secondary | ICD-10-CM | POA: Insufficient documentation

## 2016-01-08 DIAGNOSIS — Z79899 Other long term (current) drug therapy: Secondary | ICD-10-CM | POA: Diagnosis not present

## 2016-01-08 DIAGNOSIS — E7800 Pure hypercholesterolemia, unspecified: Secondary | ICD-10-CM | POA: Diagnosis not present

## 2016-01-08 DIAGNOSIS — Z0000 Encounter for general adult medical examination without abnormal findings: Secondary | ICD-10-CM | POA: Diagnosis not present

## 2016-01-08 DIAGNOSIS — N400 Enlarged prostate without lower urinary tract symptoms: Secondary | ICD-10-CM | POA: Diagnosis not present

## 2016-01-08 DIAGNOSIS — Z23 Encounter for immunization: Secondary | ICD-10-CM | POA: Diagnosis not present

## 2016-01-08 DIAGNOSIS — R0981 Nasal congestion: Secondary | ICD-10-CM | POA: Diagnosis not present

## 2016-01-08 DIAGNOSIS — G4700 Insomnia, unspecified: Secondary | ICD-10-CM | POA: Diagnosis not present

## 2016-01-13 DIAGNOSIS — H353221 Exudative age-related macular degeneration, left eye, with active choroidal neovascularization: Secondary | ICD-10-CM | POA: Diagnosis not present

## 2016-01-28 DIAGNOSIS — Z961 Presence of intraocular lens: Secondary | ICD-10-CM | POA: Diagnosis not present

## 2016-01-28 DIAGNOSIS — H524 Presbyopia: Secondary | ICD-10-CM | POA: Diagnosis not present

## 2016-01-28 DIAGNOSIS — H35371 Puckering of macula, right eye: Secondary | ICD-10-CM | POA: Diagnosis not present

## 2016-01-28 DIAGNOSIS — H2512 Age-related nuclear cataract, left eye: Secondary | ICD-10-CM | POA: Diagnosis not present

## 2016-02-10 DIAGNOSIS — E7800 Pure hypercholesterolemia, unspecified: Secondary | ICD-10-CM | POA: Diagnosis not present

## 2016-02-10 DIAGNOSIS — Z23 Encounter for immunization: Secondary | ICD-10-CM | POA: Diagnosis not present

## 2016-02-10 DIAGNOSIS — Z79899 Other long term (current) drug therapy: Secondary | ICD-10-CM | POA: Diagnosis not present

## 2016-02-10 DIAGNOSIS — Z0000 Encounter for general adult medical examination without abnormal findings: Secondary | ICD-10-CM | POA: Diagnosis not present

## 2016-02-10 DIAGNOSIS — K5900 Constipation, unspecified: Secondary | ICD-10-CM | POA: Diagnosis not present

## 2016-03-01 DIAGNOSIS — H353221 Exudative age-related macular degeneration, left eye, with active choroidal neovascularization: Secondary | ICD-10-CM | POA: Diagnosis not present

## 2016-03-17 ENCOUNTER — Other Ambulatory Visit: Payer: Self-pay | Admitting: Internal Medicine

## 2016-04-07 ENCOUNTER — Other Ambulatory Visit: Payer: Self-pay | Admitting: Internal Medicine

## 2016-04-12 DIAGNOSIS — H353221 Exudative age-related macular degeneration, left eye, with active choroidal neovascularization: Secondary | ICD-10-CM | POA: Diagnosis not present

## 2016-05-12 DIAGNOSIS — J309 Allergic rhinitis, unspecified: Secondary | ICD-10-CM | POA: Diagnosis not present

## 2016-05-17 DIAGNOSIS — H353222 Exudative age-related macular degeneration, left eye, with inactive choroidal neovascularization: Secondary | ICD-10-CM | POA: Diagnosis not present

## 2016-06-15 ENCOUNTER — Emergency Department (HOSPITAL_COMMUNITY): Payer: Medicare Other

## 2016-06-15 ENCOUNTER — Inpatient Hospital Stay (HOSPITAL_COMMUNITY)
Admission: EM | Admit: 2016-06-15 | Discharge: 2016-06-20 | DRG: 194 | Disposition: A | Discharge status: Skilled Nursing Facility | Payer: Medicare Other | Attending: Nephrology | Admitting: Nephrology

## 2016-06-15 ENCOUNTER — Encounter (HOSPITAL_COMMUNITY): Payer: Self-pay | Admitting: Emergency Medicine

## 2016-06-15 ENCOUNTER — Observation Stay (HOSPITAL_BASED_OUTPATIENT_CLINIC_OR_DEPARTMENT_OTHER): Payer: Medicare Other

## 2016-06-15 DIAGNOSIS — I480 Paroxysmal atrial fibrillation: Secondary | ICD-10-CM | POA: Diagnosis not present

## 2016-06-15 DIAGNOSIS — L89151 Pressure ulcer of sacral region, stage 1: Secondary | ICD-10-CM | POA: Diagnosis not present

## 2016-06-15 DIAGNOSIS — E669 Obesity, unspecified: Secondary | ICD-10-CM | POA: Diagnosis present

## 2016-06-15 DIAGNOSIS — Z85028 Personal history of other malignant neoplasm of stomach: Secondary | ICD-10-CM

## 2016-06-15 DIAGNOSIS — K219 Gastro-esophageal reflux disease without esophagitis: Secondary | ICD-10-CM | POA: Diagnosis present

## 2016-06-15 DIAGNOSIS — L8990 Pressure ulcer of unspecified site, unspecified stage: Secondary | ICD-10-CM | POA: Insufficient documentation

## 2016-06-15 DIAGNOSIS — D62 Acute posthemorrhagic anemia: Secondary | ICD-10-CM | POA: Diagnosis not present

## 2016-06-15 DIAGNOSIS — E784 Other hyperlipidemia: Secondary | ICD-10-CM

## 2016-06-15 DIAGNOSIS — R42 Dizziness and giddiness: Secondary | ICD-10-CM | POA: Diagnosis not present

## 2016-06-15 DIAGNOSIS — J189 Pneumonia, unspecified organism: Secondary | ICD-10-CM | POA: Diagnosis present

## 2016-06-15 DIAGNOSIS — N179 Acute kidney failure, unspecified: Secondary | ICD-10-CM

## 2016-06-15 DIAGNOSIS — Z86010 Personal history of colonic polyps: Secondary | ICD-10-CM

## 2016-06-15 DIAGNOSIS — N400 Enlarged prostate without lower urinary tract symptoms: Secondary | ICD-10-CM | POA: Diagnosis present

## 2016-06-15 DIAGNOSIS — J181 Lobar pneumonia, unspecified organism: Principal | ICD-10-CM

## 2016-06-15 DIAGNOSIS — Z8546 Personal history of malignant neoplasm of prostate: Secondary | ICD-10-CM

## 2016-06-15 DIAGNOSIS — N183 Chronic kidney disease, stage 3 (moderate): Secondary | ICD-10-CM

## 2016-06-15 DIAGNOSIS — R55 Syncope and collapse: Secondary | ICD-10-CM | POA: Diagnosis not present

## 2016-06-15 DIAGNOSIS — Z6833 Body mass index (BMI) 33.0-33.9, adult: Secondary | ICD-10-CM

## 2016-06-15 DIAGNOSIS — M7989 Other specified soft tissue disorders: Secondary | ICD-10-CM

## 2016-06-15 DIAGNOSIS — R1310 Dysphagia, unspecified: Secondary | ICD-10-CM

## 2016-06-15 DIAGNOSIS — E785 Hyperlipidemia, unspecified: Secondary | ICD-10-CM | POA: Diagnosis present

## 2016-06-15 DIAGNOSIS — G7000 Myasthenia gravis without (acute) exacerbation: Secondary | ICD-10-CM | POA: Diagnosis present

## 2016-06-15 DIAGNOSIS — R9431 Abnormal electrocardiogram [ECG] [EKG]: Secondary | ICD-10-CM | POA: Diagnosis not present

## 2016-06-15 DIAGNOSIS — S0990XA Unspecified injury of head, initial encounter: Secondary | ICD-10-CM | POA: Diagnosis not present

## 2016-06-15 DIAGNOSIS — R001 Bradycardia, unspecified: Secondary | ICD-10-CM | POA: Diagnosis present

## 2016-06-15 DIAGNOSIS — Z79899 Other long term (current) drug therapy: Secondary | ICD-10-CM

## 2016-06-15 DIAGNOSIS — R410 Disorientation, unspecified: Secondary | ICD-10-CM | POA: Diagnosis not present

## 2016-06-15 DIAGNOSIS — Z808 Family history of malignant neoplasm of other organs or systems: Secondary | ICD-10-CM

## 2016-06-15 DIAGNOSIS — H3530 Unspecified macular degeneration: Secondary | ICD-10-CM | POA: Diagnosis present

## 2016-06-15 DIAGNOSIS — Z87891 Personal history of nicotine dependence: Secondary | ICD-10-CM

## 2016-06-15 DIAGNOSIS — R739 Hyperglycemia, unspecified: Secondary | ICD-10-CM

## 2016-06-15 DIAGNOSIS — Z85828 Personal history of other malignant neoplasm of skin: Secondary | ICD-10-CM

## 2016-06-15 DIAGNOSIS — R404 Transient alteration of awareness: Secondary | ICD-10-CM | POA: Diagnosis not present

## 2016-06-15 DIAGNOSIS — G510 Bell's palsy: Secondary | ICD-10-CM | POA: Diagnosis present

## 2016-06-15 DIAGNOSIS — R4189 Other symptoms and signs involving cognitive functions and awareness: Secondary | ICD-10-CM

## 2016-06-15 DIAGNOSIS — K2270 Barrett's esophagus without dysplasia: Secondary | ICD-10-CM | POA: Diagnosis present

## 2016-06-15 DIAGNOSIS — Z8551 Personal history of malignant neoplasm of bladder: Secondary | ICD-10-CM

## 2016-06-15 DIAGNOSIS — Z8572 Personal history of non-Hodgkin lymphomas: Secondary | ICD-10-CM

## 2016-06-15 DIAGNOSIS — D72829 Elevated white blood cell count, unspecified: Secondary | ICD-10-CM

## 2016-06-15 DIAGNOSIS — I639 Cerebral infarction, unspecified: Secondary | ICD-10-CM

## 2016-06-15 DIAGNOSIS — I509 Heart failure, unspecified: Secondary | ICD-10-CM | POA: Diagnosis not present

## 2016-06-15 DIAGNOSIS — Z96652 Presence of left artificial knee joint: Secondary | ICD-10-CM | POA: Diagnosis present

## 2016-06-15 DIAGNOSIS — R531 Weakness: Secondary | ICD-10-CM

## 2016-06-15 DIAGNOSIS — Z7901 Long term (current) use of anticoagulants: Secondary | ICD-10-CM

## 2016-06-15 DIAGNOSIS — Z8042 Family history of malignant neoplasm of prostate: Secondary | ICD-10-CM

## 2016-06-15 HISTORY — DX: Paroxysmal atrial fibrillation: I48.0

## 2016-06-15 LAB — CBC WITH DIFFERENTIAL/PLATELET
Basophils Absolute: 0 10*3/uL (ref 0.0–0.1)
Basophils Relative: 0 %
EOS PCT: 5 %
Eosinophils Absolute: 0.5 10*3/uL (ref 0.0–0.7)
HCT: 37.4 % — ABNORMAL LOW (ref 39.0–52.0)
HEMOGLOBIN: 12 g/dL — AB (ref 13.0–17.0)
LYMPHS ABS: 1.9 10*3/uL (ref 0.7–4.0)
LYMPHS PCT: 22 %
MCH: 27.3 pg (ref 26.0–34.0)
MCHC: 32.1 g/dL (ref 30.0–36.0)
MCV: 85 fL (ref 78.0–100.0)
MONOS PCT: 10 %
Monocytes Absolute: 0.8 10*3/uL (ref 0.1–1.0)
Neutro Abs: 5.4 10*3/uL (ref 1.7–7.7)
Neutrophils Relative %: 63 %
PLATELETS: 173 10*3/uL (ref 150–400)
RBC: 4.4 MIL/uL (ref 4.22–5.81)
RDW: 13.9 % (ref 11.5–15.5)
WBC: 8.6 10*3/uL (ref 4.0–10.5)

## 2016-06-15 LAB — COMPREHENSIVE METABOLIC PANEL
ALBUMIN: 3.3 g/dL — AB (ref 3.5–5.0)
ALT: 11 U/L — AB (ref 17–63)
AST: 19 U/L (ref 15–41)
Alkaline Phosphatase: 31 U/L — ABNORMAL LOW (ref 38–126)
Anion gap: 11 (ref 5–15)
BUN: 20 mg/dL (ref 6–20)
CHLORIDE: 105 mmol/L (ref 101–111)
CO2: 22 mmol/L (ref 22–32)
CREATININE: 1.06 mg/dL (ref 0.61–1.24)
Calcium: 9 mg/dL (ref 8.9–10.3)
GFR calc Af Amer: >60 mL/min (ref >60)
GFR calc non Af Amer: >60 mL/min (ref >60)
GLUCOSE: 102 mg/dL — AB (ref 65–99)
POTASSIUM: 3.8 mmol/L (ref 3.5–5.1)
Sodium: 138 mmol/L (ref 135–145)
Total Bilirubin: 0.7 mg/dL (ref 0.3–1.2)
Total Protein: 6.6 g/dL (ref 6.5–8.1)

## 2016-06-15 LAB — RAPID URINE DRUG SCREEN, HOSP PERFORMED
AMPHETAMINES: NOT DETECTED
BARBITURATES: NOT DETECTED
BENZODIAZEPINES: NOT DETECTED
Cocaine: NOT DETECTED
Opiates: NOT DETECTED
TETRAHYDROCANNABINOL: NOT DETECTED

## 2016-06-15 LAB — URINALYSIS, COMPLETE (UACMP) WITH MICROSCOPIC
Bilirubin Urine: NEGATIVE
Glucose, UA: NEGATIVE mg/dL
HGB URINE DIPSTICK: NEGATIVE
Ketones, ur: NEGATIVE mg/dL
Leukocytes, UA: NEGATIVE
Nitrite: NEGATIVE
PH: 6.5 (ref 5.0–8.0)
Protein, ur: NEGATIVE mg/dL
SPECIFIC GRAVITY, URINE: 1.015 (ref 1.005–1.030)

## 2016-06-15 LAB — TROPONIN I: TROPONIN I: 0.04 ng/mL — AB (ref <0.03)

## 2016-06-15 LAB — PROCALCITONIN

## 2016-06-15 LAB — BRAIN NATRIURETIC PEPTIDE: B Natriuretic Peptide: 322.3 pg/mL — ABNORMAL HIGH (ref 0.0–100.0)

## 2016-06-15 LAB — APTT: APTT: 49 s — AB (ref 24–36)

## 2016-06-15 LAB — POC OCCULT BLOOD, ED: Fecal Occult Bld: NEGATIVE

## 2016-06-15 LAB — HEPARIN LEVEL (UNFRACTIONATED): HEPARIN UNFRACTIONATED: 0.7 [IU]/mL (ref 0.30–0.70)

## 2016-06-15 LAB — INFLUENZA PANEL BY PCR (TYPE A & B)
INFLBPCR: NEGATIVE
Influenza A By PCR: NEGATIVE

## 2016-06-15 LAB — MAGNESIUM: MAGNESIUM: 2.1 mg/dL (ref 1.7–2.4)

## 2016-06-15 MED ORDER — DOCUSATE SODIUM 100 MG PO CAPS
100.0000 mg | ORAL_CAPSULE | Freq: Two times a day (BID) | ORAL | Status: DC
  Administered 2016-06-16 – 2016-06-20 (×9): 100 mg via ORAL
  Filled 2016-06-15 (×9): qty 1

## 2016-06-15 MED ORDER — ACETAMINOPHEN 325 MG PO TABS: 650.0000 mg | ORAL_TABLET | Freq: Four times a day (QID) | ORAL | Status: DC | PRN

## 2016-06-15 MED ORDER — SIMVASTATIN 40 MG PO TABS
40.0000 mg | ORAL_TABLET | Freq: Every day | ORAL | Status: DC
  Administered 2016-06-16 – 2016-06-19 (×4): 40 mg via ORAL
  Filled 2016-06-15 (×4): qty 1

## 2016-06-15 MED ORDER — ACETAMINOPHEN 650 MG RE SUPP: 650.0000 mg | Freq: Four times a day (QID) | RECTAL | Status: DC | PRN

## 2016-06-15 MED ORDER — APIXABAN 5 MG PO TABS: 5.0000 mg | ORAL_TABLET | Freq: Two times a day (BID) | ORAL | Status: DC

## 2016-06-15 MED ORDER — TRAZODONE HCL 50 MG PO TABS: 50.0000 mg | ORAL_TABLET | Freq: Every evening | ORAL | Status: DC | PRN

## 2016-06-15 MED ORDER — ONDANSETRON HCL 4 MG/2ML IJ SOLN: 4.0000 mg | Freq: Four times a day (QID) | INTRAMUSCULAR | Status: DC | PRN

## 2016-06-15 MED ORDER — SODIUM CHLORIDE 0.9 % IV SOLN
INTRAVENOUS | Status: DC
  Administered 2016-06-15: 1000 mL via INTRAVENOUS

## 2016-06-15 MED ORDER — TAMSULOSIN HCL 0.4 MG PO CAPS
0.4000 mg | ORAL_CAPSULE | Freq: Every day | ORAL | Status: DC
Start: 2016-06-15 — End: 2016-06-20
  Administered 2016-06-16 – 2016-06-19 (×4): 0.4 mg via ORAL
  Filled 2016-06-15 (×4): qty 1

## 2016-06-15 MED ORDER — DEXTROSE-NACL 5-0.45 % IV SOLN
INTRAVENOUS | Status: DC
  Administered 2016-06-15: 18:00:00 via INTRAVENOUS

## 2016-06-15 MED ORDER — SODIUM CHLORIDE 0.9% FLUSH
3.0000 mL | Freq: Two times a day (BID) | INTRAVENOUS | Status: DC
  Administered 2016-06-16 – 2016-06-20 (×9): 3 mL via INTRAVENOUS

## 2016-06-15 MED ORDER — CEFTRIAXONE SODIUM 1 G IJ SOLR
1.0000 g | Freq: Once | INTRAMUSCULAR | Status: AC
  Administered 2016-06-15: 1 g via INTRAVENOUS
  Filled 2016-06-15: qty 10

## 2016-06-15 MED ORDER — HEPARIN (PORCINE) IN NACL 100-0.45 UNIT/ML-% IJ SOLN
1400.0000 [IU]/h | INTRAMUSCULAR | Status: DC
  Administered 2016-06-15: 1400 [IU]/h via INTRAVENOUS
  Filled 2016-06-15: qty 250

## 2016-06-15 MED ORDER — DEXTROSE 5 % IV SOLN
1.0000 g | INTRAVENOUS | Status: DC
  Administered 2016-06-16 – 2016-06-20 (×5): 1 g via INTRAVENOUS
  Filled 2016-06-15 (×5): qty 10

## 2016-06-15 MED ORDER — OMEGA-3 FATTY ACIDS 1000 MG PO CAPS: 1.0000 g | ORAL_CAPSULE | Freq: Two times a day (BID) | ORAL | Status: DC

## 2016-06-15 MED ORDER — OMEGA-3-ACID ETHYL ESTERS 1 G PO CAPS
1.0000 g | ORAL_CAPSULE | Freq: Two times a day (BID) | ORAL | Status: DC
  Administered 2016-06-16 – 2016-06-20 (×9): 1 g via ORAL
  Filled 2016-06-15 (×9): qty 1

## 2016-06-15 MED ORDER — PANTOPRAZOLE SODIUM 40 MG PO TBEC
80.0000 mg | DELAYED_RELEASE_TABLET | Freq: Every day | ORAL | Status: DC
  Administered 2016-06-16 – 2016-06-20 (×5): 80 mg via ORAL
  Filled 2016-06-15 (×5): qty 2

## 2016-06-15 MED ORDER — ONDANSETRON HCL 4 MG PO TABS: 4.0000 mg | ORAL_TABLET | Freq: Four times a day (QID) | ORAL | Status: DC | PRN

## 2016-06-15 MED ORDER — SENNA 8.6 MG PO TABS
1.0000 | ORAL_TABLET | Freq: Two times a day (BID) | ORAL | Status: DC
  Administered 2016-06-16 – 2016-06-20 (×7): 8.6 mg via ORAL
  Filled 2016-06-15 (×7): qty 1

## 2016-06-15 MED ORDER — DEXTROSE 5 % IV SOLN
500.0000 mg | INTRAVENOUS | Status: DC
  Administered 2016-06-16 – 2016-06-17 (×2): 500 mg via INTRAVENOUS
  Filled 2016-06-15 (×3): qty 500

## 2016-06-15 MED ORDER — DEXTROSE 5 % IV SOLN
500.0000 mg | Freq: Once | INTRAVENOUS | Status: AC
  Administered 2016-06-15: 500 mg via INTRAVENOUS
  Filled 2016-06-15: qty 500

## 2016-06-15 MED ORDER — FUROSEMIDE 10 MG/ML IJ SOLN
40.0000 mg | Freq: Once | INTRAMUSCULAR | Status: AC
  Administered 2016-06-15: 40 mg via INTRAVENOUS
  Filled 2016-06-15: qty 4

## 2016-06-15 NOTE — ED Provider Notes (Signed)
[facility] DEPT Provider Note   CSN: [id number] Arrival date & time: 06/15/16  0704     History   Chief Complaint Chief Complaint  Patient presents with  . Altered Mental Status    HPI Jason [name] is a 83 y.o. male.Complains of generalized weakness onset yesterday. Continuing on to today. Patient reportedly had difficulty speaking earlier today and feels improved presently, without treatment. No focal weakness. No fever. He does admit to slight cough. No other associated symptoms. No treatment prior to coming here and nothing makes symptoms better or worse. Denies pain anywhere denies shortness of breath. Denies urinary symptoms  HPI  Past Medical History:  Diagnosis Date  . Arthritis   . Barrett esophagus   . Bladder cancer ([place]) dx'd 7253,6644   surg only  . Colon polyps    adenomatous  . Difficulty sleeping    OCCASIONALLY  . Diverticulosis of colon (without mention of hemorrhage)   . Dysrhythmia    INTERMITANT A FIB  . Frequency of urination   . GERD (gastroesophageal reflux disease)   . Hiatal hernia   . History of bladder cancer 1981   EXCISION ONLY  . History of skin cancer    BASAL CELL  . Hyperlipidemia   . Lymphoma, non-Hodgkin's ([place]) 2011   [place]   . Macular degeneration    LEFT EYE  . Multiple nodules of lung 09/16/2011  . Non Hodgkin's lymphoma ([place]) dx'd 01/03/2010  . Personal history of colonic polyps 07/15/2010   tubular adenoma  . Prostate cancer [facility])    patient denies  . PUD (peptic ulcer disease)   . Recurrent cold sores   . Stomach cancer ([place])    NHL origin    Patient Active Problem List   Diagnosis Date Noted  . Primary osteoarthritis of left knee 07/30/2015  . BPH (benign prostatic hyperplasia) 07/16/2012  . Multiple nodules of lung 09/16/2011  . GERD (gastroesophageal reflux disease) 06/14/2011  . Constipation, chronic 06/14/2011  . NHL (non-Hodgkin's lymphoma) ([place]) 01/03/2010  . BLADDER CANCER 05/28/2007  .  HYPERLIPIDEMIA 05/28/2007  . HIATAL HERNIA 05/28/2007  . BARRETT'S ESOPHAGUS, HX OF 05/28/2007    Past Surgical History:  Procedure Laterality Date  . APPENDECTOMY    . bone spur     left shoulder  . CHOLECYSTECTOMY    . KNEE ARTHROPLASTY Left 07/30/2015   Procedure: LEFT TOTAL KNEE ARTHROPLASTY WITH COMPUTER NAVIGATION;  Surgeon: Rod Can, MD;  Location: [facility] ORS;  Service: Orthopedics;  Laterality: Left;  . LASER OF PROSTATE W/ GREEN LIGHT PVP    . TENDON REPAIR     right arm  . TRIGGER FINGER RELEASE     bilateral hands  . WRIST SURGERY     Bilateral       Home Medications    Prior to Admission medications   Medication Sig Start Date End Date Taking? Authorizing Provider  apixaban (ELIQUIS) 5 MG TABS tablet Take 5 mg by mouth 2 (two) times daily.    Historical Provider, MD  esomeprazole (NEXIUM) 40 MG capsule TAKE 1 CAPSULE (40 MG TOTAL) BY MOUTH 2 (TWO) TIMES DAILY BEFORE A MEAL. 03/17/16   Irene Shipper, MD  esomeprazole (NEXIUM) 40 MG capsule TAKE 1 CAPSULE (40 MG TOTAL) BY MOUTH 2 (TWO) TIMES DAILY BEFORE A MEAL. 04/08/16   Irene Shipper, MD  fish oil-omega-3 fatty acids 1000 MG capsule Take 1 g by mouth 2 (two) times daily.     Historical Provider,  MD  Multiple Vitamins-Minerals (ICAPS MV PO) Take by mouth 2 (two) times daily.      Historical Provider, MD  simvastatin (ZOCOR) 40 MG tablet Take 40 mg by mouth at bedtime.      Historical Provider, MD  tamsulosin (FLOMAX) 0.4 MG CAPS capsule Take 0.4 mg by mouth at bedtime.    Historical Provider, MD  traZODone (DESYREL) 50 MG tablet Take 50 mg by mouth at bedtime as needed for sleep.    Historical Provider, MD    Family History Family History  Problem Relation Age of Onset  . Prostate cancer Father   . Bone cancer Brother   . Cancer Brother     back    Social History Social History  Substance Use Topics  . Smoking status: Former Smoker    Quit date: 10/29/1966  . Smokeless tobacco: Former Systems developer    Quit date:  06/29/1998  . Alcohol use No     Allergies   Patient has no known allergies.   Review of Systems Review of Systems  Musculoskeletal: Positive for gait problem.       Walks with walker  Allergic/Immunologic: Positive for immunocompromised state.       History of cancer  Neurological: Positive for speech difficulty and weakness.       Generalized weakness     Physical Exam Updated Vital Signs BP 163/82 (BP Location: Left Arm)   Pulse 72   Temp 97.9 F (36.6 C) (Rectal)   Resp 17   Ht 5\' 10"  (1.778 m)   Wt 240 lb (108.9 kg)   SpO2 96%   BMI 34.44 kg/m   Physical Exam  Constitutional: He is oriented to person, place, and time. He appears well-developed and well-nourished.  HENT:  Head: Normocephalic and atraumatic.  Eyes: Conjunctivae are normal. Pupils are equal, round, and reactive to light.  Neck: Neck supple. No tracheal deviation present. No thyromegaly present.  Cardiovascular: Normal rate and regular rhythm.   No murmur heard. Pulmonary/Chest: Effort normal and breath sounds normal.  Abdominal: Soft. Bowel sounds are normal. He exhibits no distension. There is no tenderness.  Genitourinary: Rectum normal and penis normal. Rectal exam shows guaiac negative stool.  Genitourinary Comments: Normal tone. Brown stool no gross blood  Musculoskeletal: Normal range of motion. He exhibits no edema or tenderness.  Neurological: He is alert and oriented to person, place, and time. Coordination normal.  DTRs symmetric bilaterally at knee jerk ankle jerk and biceps toes or going bilaterally  Skin: Skin is warm and dry. No rash noted.  Psychiatric: He has a normal mood and affect.  Nursing note and vitals reviewed.    ED Treatments / Results  Labs (all labs ordered are listed, but only abnormal results are displayed) Labs Reviewed  COMPREHENSIVE METABOLIC PANEL  TROPONIN I  RAPID URINE DRUG SCREEN, HOSP PERFORMED  URINALYSIS, COMPLETE (UACMP) WITH MICROSCOPIC  CBC  WITH DIFFERENTIAL/PLATELET  POC OCCULT BLOOD, ED    EKG  EKG Interpretation  Date/Time:  Wednesday June 15 2016 07:13:50 EDT Ventricular Rate:  71 PR Interval:    QRS Duration: 140 QT Interval:  426 QTC Calculation: 463 R Axis:   -51 Text Interpretation:  Sinus rhythm Short PR interval IVCD, consider atypical RBBB Anterior infarct, old Non-specific intra-ventricular conduction delay New since previous tracing Confirmed by Winfred Leeds  MD, Phenix Vandermeulen [phone]) on 06/15/2016 7:53:34 AM      Results for orders placed or performed during the hospital encounter of 06/15/16  Comprehensive metabolic  panel  Result Value Ref Range   Sodium 138 135 - 145 mmol/L   Potassium 3.8 3.5 - 5.1 mmol/L   Chloride 105 101 - 111 mmol/L   CO2 22 22 - 32 mmol/L   Glucose, Bld 102 (H) 65 - 99 mg/dL   BUN 20 6 - 20 mg/dL   Creatinine, Ser 1.06 0.61 - 1.24 mg/dL   Calcium 9.0 8.9 - 10.3 mg/dL   Total Protein 6.6 6.5 - 8.1 g/dL   Albumin 3.3 (L) 3.5 - 5.0 g/dL   AST 19 15 - 41 U/L   ALT 11 (L) 17 - 63 U/L   Alkaline Phosphatase 31 (L) 38 - 126 U/L   Total Bilirubin 0.7 0.3 - 1.2 mg/dL   GFR calc non Af Amer >60 >60 mL/min   GFR calc Af Amer >60 >60 mL/min   Anion gap 11 5 - 15  Troponin I  Result Value Ref Range   Troponin I 0.04 (HH) <0.03 ng/mL  CBC with Differential/Platelet  Result Value Ref Range   WBC 8.6 4.0 - 10.5 K/uL   RBC 4.40 4.22 - 5.81 MIL/uL   Hemoglobin 12.0 (L) 13.0 - 17.0 g/dL   HCT 37.4 (L) 39.0 - 52.0 %   MCV 85.0 78.0 - 100.0 fL   MCH 27.3 26.0 - 34.0 pg   MCHC 32.1 30.0 - 36.0 g/dL   RDW 13.9 11.5 - 15.5 %   Platelets 173 150 - 400 K/uL   Neutrophils Relative % 63 %   Neutro Abs 5.4 1.7 - 7.7 K/uL   Lymphocytes Relative 22 %   Lymphs Abs 1.9 0.7 - 4.0 K/uL   Monocytes Relative 10 %   Monocytes Absolute 0.8 0.1 - 1.0 K/uL   Eosinophils Relative 5 %   Eosinophils Absolute 0.5 0.0 - 0.7 K/uL   Basophils Relative 0 %   Basophils Absolute 0.0 0.0 - 0.1 K/uL  POC occult  blood, ED Provider will collect  Result Value Ref Range   Fecal Occult Bld NEGATIVE NEGATIVE   Dg Chest 2 View  Result Date: 06/15/2016 CLINICAL DATA:  Dizziness and confusion EXAM: CHEST  2 VIEW COMPARISON:  Aug 09, 2013 FINDINGS: There is cardiomegaly with mild pulmonary venous hypertension. There is mild interstitial edema in the lung bases. There is focal airspace consolidation in the medial left base. Lungs elsewhere clear. No adenopathy. No bone lesions. IMPRESSION: Findings consistent with a degree of congestive heart failure. Suspect superimposed pneumonia medial left base. Followup PA and lateral chest radiographs recommended in 3-4 weeks following trial of antibiotic therapy to ensure resolution and exclude underlying malignancy. Electronically Signed   By: Lowella Grip III M.D.   On: 06/15/2016 07:58   Ct Head Wo Contrast  Result Date: 06/15/2016 CLINICAL DATA:  Lower extremity weakness.  Fall. EXAM: CT HEAD WITHOUT CONTRAST TECHNIQUE: Contiguous axial images were obtained from the base of the skull through the vertex without intravenous contrast. COMPARISON:  Head CT November 15, 2015 FINDINGS: Brain: Moderate diffuse atrophy is stable. There is no intracranial mass, hemorrhage, extra-axial fluid collection, or midline shift. There is mild patchy small vessel disease in the centra semiovale bilaterally. Elsewhere gray-white compartments appear normal. No acute infarct evident. Vascular: No hyperdense vessel. There are foci of calcification in each carotid siphon region as well as in each distal vertebral artery. Skull: The bony calvarium appears intact. Sinuses/Orbits: There is mucosal thickening and opacification in multiple ethmoid air cells. There is mucosal thickening in the anteromedial  right sphenoid sinus. Visualized paranasal sinuses elsewhere are clear. Visualized orbits appear symmetric bilaterally with the exception of apparent cataract removal on the right. Other: Mastoid air  cells are clear. IMPRESSION: Atrophy with relatively mild periventricular small vessel disease, stable. No intracranial mass, hemorrhage, or extra-axial fluid collection. No acute appearing infarct. Areas of arterial vascular calcification noted. There is paranasal sinus disease, primarily in multiple ethmoid air cells. Electronically Signed   By: Lowella Grip III M.D.   On: 06/15/2016 08:10   Radiology No results found. Chest x-ray viewed by me Procedures Procedures (including critical care time)  Medications Ordered in ED Medications - No data to display  Other history from patient's daughter patient has had chronic cough for approximately a year however worse yesterday he complained of generalized weakness and was somewhat confused yesterday Initial Impression / Assessment and Plan / ED Course  I have reviewed the triage vital signs and the nursing notes.  Pertinent labs & imaging results that were available during my care of the patient were reviewed by me and considered in my medical decision making (see chart for details). Light of cough, altered mental status we'll treat with intravenous antibiotics.    Hospitalist service consulted and will arrange for overnight stay, and see patient in hospital  Final Clinical Impressions(s) / ED Diagnoses  Diagnosis #1 generalized weakness #2 cough differential diagnosis to include congestive heart failure versus community acquired pneumonia Final diagnoses:  None    New Prescriptions New Prescriptions   No medications on file     Orlie Dakin, MD 06/15/16 [phone]

## 2016-06-15 NOTE — ED Notes (Signed)
Patient was given his lunch tray started choking after eating his food, tray was removed and Dr. Janalyn Rouse will order Speech swallow . Patient is able to clear his airway. Report given to 5W RN aware

## 2016-06-15 NOTE — ED Notes (Signed)
Patient aware he needs a urine sample

## 2016-06-15 NOTE — ED Notes (Signed)
Lunch tray ordered 

## 2016-06-15 NOTE — ED Notes (Signed)
Tropin level given to Dr. Winfred Leeds 0.04

## 2016-06-15 NOTE — Progress Notes (Signed)
PT Cancellation Note  Patient Details Name: Jason [name] MRN: [id number] DOB: 02/14/1928   Cancelled Treatment:    Reason Eval/Treat Not Completed: Other (comment) (awaiting LE doppler)   Tyffani Foglesong,KATHrine E 06/15/2016, 2:11 PM Carmelia Bake, PT, DPT 06/15/2016 Pager: [phone]

## 2016-06-15 NOTE — Progress Notes (Signed)
Pt is NPO in need of SLP eval for diet. Pt is on Eliquis for AF. Unable to take tonight due to NPO status. ChadsVas score calculated as 3, so will start heparin in light of missing Eliquis. No hx GIB on chart and Hgb/plts are normal.  KJKG, NP [facility]

## 2016-06-15 NOTE — ED Notes (Signed)
Presently  Alert oriented denies pain

## 2016-06-15 NOTE — ED Triage Notes (Signed)
BIB EMS from home, called out for generalized weakness. Pt noted to be altered, slow to respond, aphasic. EMS was also called out last night for similar S/S but pt declined transport. VSS. EDP at bedside.

## 2016-06-15 NOTE — Progress Notes (Signed)
ANTICOAGULATION CONSULT NOTE - Initial Consult  Pharmacy Consult for heparin Indication: atrial fibrillation  No Known Allergies  Patient Measurements: Height: 5\' 10"  (177.8 cm) Weight: 233 lb 11 oz (106 kg) IBW/kg (Calculated) : 73 Heparin Dosing Weight: 95 kg  Vital Signs: Temp: 98 F (36.7 C) (03/14 1224) BP: 148/69 (03/14 1224) Pulse Rate: 69 (03/14 1224)  Labs:  Recent Labs  06/15/16 0726 06/15/16 2026  HGB 12.0*  --   HCT 37.4*  --   PLT 173  --   APTT  --  49*  HEPARINUNFRC  --  0.70  CREATININE 1.06  --   TROPONINI 0.04*  --     Estimated Creatinine Clearance: 61 mL/min (by C-G formula based on SCr of 1.06 mg/dL).  Assessment: CC/HPI: admitted 06/15/2016 with fall, generalized weakness  PMH: PAF, GERD/PUD, Barrett's esophagus, bladder CA, non-Hodgkin's lymphoma, OA, hyperlipidemia   Pending speech eval so holding eliquis unknown last dose  Baseline labs hep lvl 0.7 aptt 49   Goal of Therapy:  Heparin level 0.3-0.7 units/ml aPTT 66 - 102 seconds Monitor platelets by anticoagulation protocol: Yes   Plan:  Dc apixaban Heparin 1400 units/hr Labs in am - hep lvl aptt  Levester Fresh, PharmD, BCPS, BCCCP Clinical Pharmacist 06/15/2016 9:34 PM

## 2016-06-15 NOTE — H&P (Signed)
History and Physical    Jason [name] PNT:[id number] DOB: 04-03-1928 DOA: 06/15/2016  PCP: Melinda Crutch, MD   Patient coming from: home  Chief Complaint: fall, generalized weakness  HPI: Jason [name] is a 83 y.o. male with medical history significant for Paroxysmal AFib on eliquis, GERD, Barrett's esophagus, bladder CA, non-Hodgkin's lymphoma, OA, hyperlipidemia who presented to the ED with c/o generalized weakness and fall. History was provided by his daughter at bedside who said that she visited with his high at father due to the day, and he was doing well except his blood pressure was somewhat accelerated to 161/101 mmHg. Around 9:00 PM her father, who lives independently in his own condominium called and complained of some weakness, he also was noted to be slow to respond to questions. It was concerning for her and she initiated 911 call, EMS arrived, but the patient refused to be taken to the ED as his symptoms improved. His daughter with her husband came to this pain and last night with the patient and this morning she found him on the floor near the bed. He was becoming progressively weak, unable to get up from the bed and slid on the floor. The daughter called EMS and this time patient was taken to the emergency department. He has chronic cough but during the last 2 or 3 days Worse. A.M. Also Complains of Constipation with but Denied Nausea or Vomiting There Is No Hematuria or Dysuria He Doesn't Have Any Chest Pain or Shortness of breath.  ED Course: On arrival he was febrile, vital signs were stable, blood work revealed normal white blood cells count, hemoglobin 12.0 hematocrit 37.4% CMP didn't reveal any acute abnormality, troponin was 0.04, EKG showed sinus rhythm and no acute ischemic findings.  Chest x-ray showed possible CHF with superimposed pneumonia in the middle left lung base Head CT showed no acute abnormalities, small vessel ischemic disease white matter changes, and ethmoidal  paranasal sinus disease  Review of Systems: As per HPI otherwise 10 point review of systems negative.   Ambulatory Status: Uses walker  Past Medical History:  Diagnosis Date  . Arthritis   . Barrett esophagus   . Bladder cancer ([place]) dx'd 0867,6195   surg only  . Colon polyps    adenomatous  . Difficulty sleeping    OCCASIONALLY  . Diverticulosis of colon (without mention of hemorrhage)   . Dysrhythmia    INTERMITANT A FIB  . Frequency of urination   . GERD (gastroesophageal reflux disease)   . Hiatal hernia   . History of bladder cancer 1981   EXCISION ONLY  . History of skin cancer    BASAL CELL  . Hyperlipidemia   . Lymphoma, non-Hodgkin's ([place]) 2011   [place]   . Macular degeneration    LEFT EYE  . Multiple nodules of lung 09/16/2011  . Non Hodgkin's lymphoma ([place]) dx'd 01/03/2010  . Personal history of colonic polyps 07/15/2010   tubular adenoma  . Prostate cancer [facility])    patient denies  . PUD (peptic ulcer disease)   . Recurrent cold sores   . Stomach cancer ([place])    NHL origin    Past Surgical History:  Procedure Laterality Date  . APPENDECTOMY    . bone spur     left shoulder  . CHOLECYSTECTOMY    . KNEE ARTHROPLASTY Left 07/30/2015   Procedure: LEFT TOTAL KNEE ARTHROPLASTY WITH COMPUTER NAVIGATION;  Surgeon: Rod Can, MD;  Location: [facility] ORS;  Service:  Orthopedics;  Laterality: Left;  . LASER OF PROSTATE W/ GREEN LIGHT PVP    . TENDON REPAIR     right arm  . TRIGGER FINGER RELEASE     bilateral hands  . WRIST SURGERY     Bilateral    Social History   Social History  . Marital status: Widowed    Spouse name: Jason [name]  . Number of children: 2  . Years of education: Jason [name]   Occupational History  . retired   .  Retired   Social History Main Topics  . Smoking status: Former Smoker    Quit date: 10/29/1966  . Smokeless tobacco: Former Systems developer    Quit date: 06/29/1998  . Alcohol use No  . Drug use: No  . Sexual activity: Not on file    Other Topics Concern  . Not on file   Social History Narrative  . No narrative on file    No Known Allergies  Family History  Problem Relation Age of Onset  . Prostate cancer Father   . Bone cancer Brother   . Cancer Brother     back    Prior to Admission medications   Medication Sig Start Date End Date Taking? Authorizing Provider  apixaban (ELIQUIS) 5 MG TABS tablet Take 5 mg by mouth 2 (two) times daily.    Historical Provider, MD  esomeprazole (NEXIUM) 40 MG capsule TAKE 1 CAPSULE (40 MG TOTAL) BY MOUTH 2 (TWO) TIMES DAILY BEFORE A MEAL. 03/17/16   Irene Shipper, MD  esomeprazole (NEXIUM) 40 MG capsule TAKE 1 CAPSULE (40 MG TOTAL) BY MOUTH 2 (TWO) TIMES DAILY BEFORE A MEAL. 04/08/16   Irene Shipper, MD  fish oil-omega-3 fatty acids 1000 MG capsule Take 1 g by mouth 2 (two) times daily.     Historical Provider, MD  Multiple Vitamins-Minerals (ICAPS MV PO) Take by mouth 2 (two) times daily.      Historical Provider, MD  simvastatin (ZOCOR) 40 MG tablet Take 40 mg by mouth at bedtime.      Historical Provider, MD  tamsulosin (FLOMAX) 0.4 MG CAPS capsule Take 0.4 mg by mouth at bedtime.    Historical Provider, MD  traZODone (DESYREL) 50 MG tablet Take 50 mg by mouth at bedtime as needed for sleep.    Historical Provider, MD    Physical Exam: Vitals:   06/15/16 0730 06/15/16 0843 06/15/16 0844 06/15/16 0957  BP: 154/72 161/72  140/63  Pulse: 67 68  63  Resp: 23 18  18   Temp:   98.2 F (36.8 C)   TempSrc:      SpO2: 97% 97%  96%  Weight:      Height:         General: Appears calm and comfortable Eyes: PERRLA, EOMI, normal lids, iris ENT:  grossly normal hearing, lips & tongue, mucous membranes moist and intact Neck: no lymphoadenopathy, masses or thyromegaly Cardiovascular: RRR, no m/r/g. No JVD, carotid bruits. No Left leg edema, right lower extremity is larger than left with mild pitting edema pretibially and with rope like structure palpable in the back  calf Respiratory: bilateral no wheezes, rales, Basilar crackles. Normal respiratory effort. No accessory muscle use observed Abdomen: soft, non-tender, non-distended, no organomegaly or masses appreciated. BS present in all quadrants Skin: no rash, ulcers or induration seen on limited exam Musculoskeletal: grossly normal tone BUE/BLE, good ROM, no bony abnormality or joint deformities observed Psychiatric: grossly normal mood and affect, speech fluent and appropriate, alert and  oriented x3 Neurologic: CN II-XII grossly intact, moves all extremities in coordinated fashion, sensation intact  Labs on Admission: I have personally reviewed following labs and imaging studies  CBC, BMP  GFR: Estimated Creatinine Clearance: 61.8 mL/min (by C-G formula based on SCr of 1.06 mg/dL).   Creatinine Clearance: Estimated Creatinine Clearance: 61.8 mL/min (by C-G formula based on SCr of 1.06 mg/dL).    Radiological Exams on Admission: Dg Chest 2 View  Result Date: 06/15/2016 CLINICAL DATA:  Dizziness and confusion EXAM: CHEST  2 VIEW COMPARISON:  Aug 09, 2013 FINDINGS: There is cardiomegaly with mild pulmonary venous hypertension. There is mild interstitial edema in the lung bases. There is focal airspace consolidation in the medial left base. Lungs elsewhere clear. No adenopathy. No bone lesions. IMPRESSION: Findings consistent with a degree of congestive heart failure. Suspect superimposed pneumonia medial left base. Followup PA and lateral chest radiographs recommended in 3-4 weeks following trial of antibiotic therapy to ensure resolution and exclude underlying malignancy. Electronically Signed   By: Lowella Grip III M.D.   On: 06/15/2016 07:58   Ct Head Wo Contrast  Result Date: 06/15/2016 CLINICAL DATA:  Lower extremity weakness.  Fall. EXAM: CT HEAD WITHOUT CONTRAST TECHNIQUE: Contiguous axial images were obtained from the base of the skull through the vertex without intravenous contrast.  COMPARISON:  Head CT November 15, 2015 FINDINGS: Brain: Moderate diffuse atrophy is stable. There is no intracranial mass, hemorrhage, extra-axial fluid collection, or midline shift. There is mild patchy small vessel disease in the centra semiovale bilaterally. Elsewhere gray-white compartments appear normal. No acute infarct evident. Vascular: No hyperdense vessel. There are foci of calcification in each carotid siphon region as well as in each distal vertebral artery. Skull: The bony calvarium appears intact. Sinuses/Orbits: There is mucosal thickening and opacification in multiple ethmoid air cells. There is mucosal thickening in the anteromedial right sphenoid sinus. Visualized paranasal sinuses elsewhere are clear. Visualized orbits appear symmetric bilaterally with the exception of apparent cataract removal on the right. Other: Mastoid air cells are clear. IMPRESSION: Atrophy with relatively mild periventricular small vessel disease, stable. No intracranial mass, hemorrhage, or extra-axial fluid collection. No acute appearing infarct. Areas of arterial vascular calcification noted. There is paranasal sinus disease, primarily in multiple ethmoid air cells. Electronically Signed   By: Lowella Grip III M.D.   On: 06/15/2016 08:10    EKG: Independently reviewed -SR, no ischemic ST- T wave changes   Assessment/Plan Principal Problem:   CAP (community acquired pneumonia) Active Problems:   Hyperlipidemia   GERD (gastroesophageal reflux disease)   BPH (benign prostatic hyperplasia)   PAF (paroxysmal atrial fibrillation) (HCC)    Generalized weakness, progressive  - could be d/t PNA Treat underlying issue and obtain PT evaluation  CAP  Continue antibiotics, nebulizer treatments, supplemental O2 as needed, IS Obtain Procalcitonin, continue to monitor temperature and WBC count as well as clinical progress  PAF Currently SR, rate controlled Continue Eliquis  Hyperlipidemia Continue Zocor  and Fish Oil  GERD with h/o Barrett's esophagus Continue PPI   BPH Continue Flomax, observe urinaty output  Left LE swelling Doubt it is DVT, but given rope like structure palpable on the back of the calf, will obtain DVT,  DVT prophylaxis: Eliquis Code Status: full Family Communication: at bedside Disposition Plan: telemetry Consults called: none Admission status: observation   York Grice, [place] Pager: [phone] [facility]  If 7PM-7AM, please contact night-coverage [email or web address] Password [facility]  06/15/2016, 10:25 AM

## 2016-06-15 NOTE — Progress Notes (Signed)
*  PRELIMINARY RESULTS* Vascular Ultrasound Right lower extremity venous duplex has been completed.  Preliminary findings: No evidence of DVT or baker's cyst.   Landry Mellow, RDMS, RVT  06/15/2016, 3:32 PM

## 2016-06-15 NOTE — Discharge Instructions (Signed)

## 2016-06-16 ENCOUNTER — Observation Stay (HOSPITAL_COMMUNITY): Payer: Medicare Other

## 2016-06-16 ENCOUNTER — Inpatient Hospital Stay (HOSPITAL_COMMUNITY): Payer: Medicare Other

## 2016-06-16 ENCOUNTER — Other Ambulatory Visit (HOSPITAL_COMMUNITY): Payer: Medicare Other

## 2016-06-16 DIAGNOSIS — D72829 Elevated white blood cell count, unspecified: Secondary | ICD-10-CM | POA: Diagnosis not present

## 2016-06-16 DIAGNOSIS — K22719 Barrett's esophagus with dysplasia, unspecified: Secondary | ICD-10-CM | POA: Diagnosis not present

## 2016-06-16 DIAGNOSIS — D62 Acute posthemorrhagic anemia: Secondary | ICD-10-CM | POA: Diagnosis not present

## 2016-06-16 DIAGNOSIS — Z8572 Personal history of non-Hodgkin lymphomas: Secondary | ICD-10-CM | POA: Diagnosis not present

## 2016-06-16 DIAGNOSIS — G7000 Myasthenia gravis without (acute) exacerbation: Secondary | ICD-10-CM

## 2016-06-16 DIAGNOSIS — E669 Obesity, unspecified: Secondary | ICD-10-CM | POA: Diagnosis not present

## 2016-06-16 DIAGNOSIS — Z8042 Family history of malignant neoplasm of prostate: Secondary | ICD-10-CM | POA: Diagnosis not present

## 2016-06-16 DIAGNOSIS — R4189 Other symptoms and signs involving cognitive functions and awareness: Secondary | ICD-10-CM | POA: Diagnosis not present

## 2016-06-16 DIAGNOSIS — I509 Heart failure, unspecified: Secondary | ICD-10-CM | POA: Diagnosis not present

## 2016-06-16 DIAGNOSIS — J181 Lobar pneumonia, unspecified organism: Secondary | ICD-10-CM | POA: Diagnosis not present

## 2016-06-16 DIAGNOSIS — M1990 Unspecified osteoarthritis, unspecified site: Secondary | ICD-10-CM | POA: Diagnosis not present

## 2016-06-16 DIAGNOSIS — Z8551 Personal history of malignant neoplasm of bladder: Secondary | ICD-10-CM | POA: Diagnosis not present

## 2016-06-16 DIAGNOSIS — L89151 Pressure ulcer of sacral region, stage 1: Secondary | ICD-10-CM | POA: Diagnosis not present

## 2016-06-16 DIAGNOSIS — J189 Pneumonia, unspecified organism: Secondary | ICD-10-CM | POA: Diagnosis not present

## 2016-06-16 DIAGNOSIS — K219 Gastro-esophageal reflux disease without esophagitis: Secondary | ICD-10-CM | POA: Diagnosis not present

## 2016-06-16 DIAGNOSIS — R001 Bradycardia, unspecified: Secondary | ICD-10-CM | POA: Diagnosis present

## 2016-06-16 DIAGNOSIS — G510 Bell's palsy: Secondary | ICD-10-CM | POA: Diagnosis not present

## 2016-06-16 DIAGNOSIS — H3530 Unspecified macular degeneration: Secondary | ICD-10-CM | POA: Diagnosis not present

## 2016-06-16 DIAGNOSIS — Z87891 Personal history of nicotine dependence: Secondary | ICD-10-CM | POA: Diagnosis not present

## 2016-06-16 DIAGNOSIS — Z8546 Personal history of malignant neoplasm of prostate: Secondary | ICD-10-CM | POA: Diagnosis not present

## 2016-06-16 DIAGNOSIS — Z79899 Other long term (current) drug therapy: Secondary | ICD-10-CM | POA: Diagnosis not present

## 2016-06-16 DIAGNOSIS — R55 Syncope and collapse: Secondary | ICD-10-CM | POA: Diagnosis not present

## 2016-06-16 DIAGNOSIS — S0990XA Unspecified injury of head, initial encounter: Secondary | ICD-10-CM | POA: Diagnosis not present

## 2016-06-16 DIAGNOSIS — N183 Chronic kidney disease, stage 3 (moderate): Secondary | ICD-10-CM | POA: Diagnosis not present

## 2016-06-16 DIAGNOSIS — J309 Allergic rhinitis, unspecified: Secondary | ICD-10-CM | POA: Diagnosis not present

## 2016-06-16 DIAGNOSIS — R262 Difficulty in walking, not elsewhere classified: Secondary | ICD-10-CM | POA: Diagnosis not present

## 2016-06-16 DIAGNOSIS — M6281 Muscle weakness (generalized): Secondary | ICD-10-CM | POA: Diagnosis not present

## 2016-06-16 DIAGNOSIS — R1310 Dysphagia, unspecified: Secondary | ICD-10-CM | POA: Diagnosis not present

## 2016-06-16 DIAGNOSIS — R739 Hyperglycemia, unspecified: Secondary | ICD-10-CM | POA: Diagnosis not present

## 2016-06-16 DIAGNOSIS — R1312 Dysphagia, oropharyngeal phase: Secondary | ICD-10-CM | POA: Diagnosis not present

## 2016-06-16 DIAGNOSIS — Z85028 Personal history of other malignant neoplasm of stomach: Secondary | ICD-10-CM | POA: Diagnosis not present

## 2016-06-16 DIAGNOSIS — E785 Hyperlipidemia, unspecified: Secondary | ICD-10-CM | POA: Diagnosis not present

## 2016-06-16 DIAGNOSIS — Z808 Family history of malignant neoplasm of other organs or systems: Secondary | ICD-10-CM | POA: Diagnosis not present

## 2016-06-16 DIAGNOSIS — N400 Enlarged prostate without lower urinary tract symptoms: Secondary | ICD-10-CM | POA: Diagnosis present

## 2016-06-16 DIAGNOSIS — G4700 Insomnia, unspecified: Secondary | ICD-10-CM | POA: Diagnosis not present

## 2016-06-16 DIAGNOSIS — Z85828 Personal history of other malignant neoplasm of skin: Secondary | ICD-10-CM | POA: Diagnosis not present

## 2016-06-16 DIAGNOSIS — K5900 Constipation, unspecified: Secondary | ICD-10-CM | POA: Diagnosis not present

## 2016-06-16 DIAGNOSIS — Z9181 History of falling: Secondary | ICD-10-CM | POA: Diagnosis not present

## 2016-06-16 DIAGNOSIS — Z96652 Presence of left artificial knee joint: Secondary | ICD-10-CM | POA: Diagnosis not present

## 2016-06-16 DIAGNOSIS — K449 Diaphragmatic hernia without obstruction or gangrene: Secondary | ICD-10-CM | POA: Diagnosis not present

## 2016-06-16 DIAGNOSIS — I5031 Acute diastolic (congestive) heart failure: Secondary | ICD-10-CM | POA: Diagnosis not present

## 2016-06-16 DIAGNOSIS — Z6833 Body mass index (BMI) 33.0-33.9, adult: Secondary | ICD-10-CM | POA: Diagnosis not present

## 2016-06-16 DIAGNOSIS — K2270 Barrett's esophagus without dysplasia: Secondary | ICD-10-CM | POA: Diagnosis present

## 2016-06-16 DIAGNOSIS — Z7901 Long term (current) use of anticoagulants: Secondary | ICD-10-CM | POA: Diagnosis not present

## 2016-06-16 DIAGNOSIS — H04129 Dry eye syndrome of unspecified lacrimal gland: Secondary | ICD-10-CM | POA: Diagnosis not present

## 2016-06-16 DIAGNOSIS — R531 Weakness: Secondary | ICD-10-CM | POA: Diagnosis not present

## 2016-06-16 DIAGNOSIS — I480 Paroxysmal atrial fibrillation: Secondary | ICD-10-CM | POA: Diagnosis not present

## 2016-06-16 LAB — EXPECTORATED SPUTUM ASSESSMENT W REFEX TO RESP CULTURE

## 2016-06-16 LAB — BASIC METABOLIC PANEL
ANION GAP: 10 (ref 5–15)
BUN: 19 mg/dL (ref 6–20)
CALCIUM: 8.7 mg/dL — AB (ref 8.9–10.3)
CO2: 21 mmol/L — ABNORMAL LOW (ref 22–32)
CREATININE: 1.12 mg/dL (ref 0.61–1.24)
Chloride: 104 mmol/L (ref 101–111)
GFR calc Af Amer: >60 mL/min (ref >60)
GFR, EST NON AFRICAN AMERICAN: 57 mL/min — AB (ref >60)
GLUCOSE: 121 mg/dL — AB (ref 65–99)
Potassium: 3.8 mmol/L (ref 3.5–5.1)
Sodium: 135 mmol/L (ref 135–145)

## 2016-06-16 LAB — GLUCOSE, CAPILLARY
GLUCOSE-CAPILLARY: 109 mg/dL — AB (ref 65–99)
Glucose-Capillary: 123 mg/dL — ABNORMAL HIGH (ref 65–99)
Glucose-Capillary: 127 mg/dL — ABNORMAL HIGH (ref 65–99)

## 2016-06-16 LAB — EXPECTORATED SPUTUM ASSESSMENT W GRAM STAIN, RFLX TO RESP C

## 2016-06-16 LAB — HIV ANTIBODY (ROUTINE TESTING W REFLEX): HIV Screen 4th Generation wRfx: NONREACTIVE

## 2016-06-16 LAB — CBC
HCT: 39.7 % (ref 39.0–52.0)
Hemoglobin: 12.8 g/dL — ABNORMAL LOW (ref 13.0–17.0)
MCH: 27.2 pg (ref 26.0–34.0)
MCHC: 32.2 g/dL (ref 30.0–36.0)
MCV: 84.3 fL (ref 78.0–100.0)
PLATELETS: 182 10*3/uL (ref 150–400)
RBC: 4.71 MIL/uL (ref 4.22–5.81)
RDW: 13.6 % (ref 11.5–15.5)
WBC: 10.5 10*3/uL (ref 4.0–10.5)

## 2016-06-16 LAB — MAGNESIUM
Magnesium: 1.9 mg/dL (ref 1.7–2.4)
Magnesium: 2.1 mg/dL (ref 1.7–2.4)

## 2016-06-16 LAB — LACTATE DEHYDROGENASE: LDH: 170 U/L (ref 98–192)

## 2016-06-16 LAB — MRSA PCR SCREENING: MRSA BY PCR: NEGATIVE

## 2016-06-16 LAB — HEPARIN LEVEL (UNFRACTIONATED): HEPARIN UNFRACTIONATED: 0.9 [IU]/mL — AB (ref 0.30–0.70)

## 2016-06-16 LAB — TROPONIN I: Troponin I: 0.04 ng/mL (ref <0.03)

## 2016-06-16 LAB — TSH: TSH: 1.519 u[IU]/mL (ref 0.350–4.500)

## 2016-06-16 LAB — CK: CK TOTAL: 125 U/L (ref 49–397)

## 2016-06-16 LAB — APTT: APTT: 84 s — AB (ref 24–36)

## 2016-06-16 LAB — STREP PNEUMONIAE URINARY ANTIGEN: STREP PNEUMO URINARY ANTIGEN: NEGATIVE

## 2016-06-16 LAB — AMMONIA: Ammonia: 27 umol/L (ref 9–35)

## 2016-06-16 MED ORDER — PYRIDOSTIGMINE BROMIDE 60 MG PO TABS
30.0000 mg | ORAL_TABLET | Freq: Three times a day (TID) | ORAL | Status: DC
  Administered 2016-06-16 – 2016-06-17 (×3): 30 mg via ORAL
  Filled 2016-06-16 (×5): qty 0.5

## 2016-06-16 MED ORDER — RESOURCE THICKENUP CLEAR PO POWD
ORAL | Status: DC | PRN
  Filled 2016-06-16: qty 125

## 2016-06-16 MED ORDER — APIXABAN 5 MG PO TABS
5.0000 mg | ORAL_TABLET | Freq: Two times a day (BID) | ORAL | Status: DC
  Administered 2016-06-16 – 2016-06-20 (×9): 5 mg via ORAL
  Filled 2016-06-16 (×9): qty 1

## 2016-06-16 NOTE — Progress Notes (Signed)
Responded to code blue to support patient,daughter and staff.  Patient recovered immediately.  Supported daughter emotionally.  Daughter was tearful but after code said she was okay.  Chaplain available as needed.

## 2016-06-16 NOTE — Progress Notes (Signed)
EEG Completed; Results Pending  

## 2016-06-16 NOTE — Evaluation (Signed)
Clinical/Bedside Swallow Evaluation Patient Details  Name: Jason [name] MRN: [id number] Date of Birth: 11-07-1927  Today's Date: 06/16/2016 Time: SLP Start Time (ACUTE ONLY): 0947 SLP Stop Time (ACUTE ONLY): 1010 SLP Time Calculation (min) (ACUTE ONLY): 23 min  Past Medical History:  Past Medical History:  Diagnosis Date  . Arthritis   . Barrett esophagus   . Bladder cancer ([place]) dx'd 5009,3818   surg only  . Colon polyps    adenomatous  . Difficulty sleeping    OCCASIONALLY  . Diverticulosis of colon (without mention of hemorrhage)   . Dysrhythmia    INTERMITANT A FIB  . Frequency of urination   . GERD (gastroesophageal reflux disease)   . Hiatal hernia   . History of bladder cancer 1981   EXCISION ONLY  . History of skin cancer    BASAL CELL  . Hyperlipidemia   . Lymphoma, non-Hodgkin's ([place]) 2011   [place]   . Macular degeneration    LEFT EYE  . Multiple nodules of lung 09/16/2011  . Non Hodgkin's lymphoma ([place]) dx'd 01/03/2010  . Personal history of colonic polyps 07/15/2010   tubular adenoma  . Prostate cancer [facility])    patient denies  . PUD (peptic ulcer disease)   . Recurrent cold sores   . Stomach cancer ([place])    NHL origin   Past Surgical History:  Past Surgical History:  Procedure Laterality Date  . APPENDECTOMY    . bone spur     left shoulder  . CHOLECYSTECTOMY    . KNEE ARTHROPLASTY Left 07/30/2015   Procedure: LEFT TOTAL KNEE ARTHROPLASTY WITH COMPUTER NAVIGATION;  Surgeon: Rod Can, MD;  Location: [facility];  Service: Orthopedics;  Laterality: Left;  . LASER OF PROSTATE W/ GREEN LIGHT PVP    . TENDON REPAIR     right arm  . TRIGGER FINGER RELEASE     bilateral hands  . WRIST SURGERY     Bilateral   HPI:  Jaicion Laurie Wilsonis a 83 y.o.malewith medical history significant for Paroxysmal AFib, GERD, hiatal hernia, Barrett's esophagus, multiple lung nodules, bladder CA, non-Hodgkin's lymphoma, OA, hyperlipidemia who presented to the ED  with c/o generalized weakness and fall. Chest x-ray showed possible CHF with superimposed pneumonia in the middle left lung base. Head CT showed no acute abnormalities, small vessel ischemic disease white matter changes,and ethmoidal paranasal sinus disease. Barium esophagram poor esophageal motility. No stricture or mass. Barium tablet passed readily into the stomach. Negative for reflux.   Assessment / Plan / Recommendation Clinical Impression  Primary esophageal dysphagia suspected given history of esophageal dysmotility, GERD, hiatal hernia and eructation. Thin liquids likely entered airway relieved with nectar thick during this evaluation. Mastication fucntional with solid. MBS recommended but cannot be performed until tomorrow. Recommend Dys 3, nectar thick liquids for now, no straws, stay upright 30 min after meals and pills whole in puree.  SLP Visit Diagnosis: Dysphagia, pharyngoesophageal phase (R13.14)    Aspiration Risk  Moderate aspiration risk    Diet Recommendation Dysphagia 3 (Mech soft);Nectar-thick liquid   Liquid Administration via: Cup;No straw Medication Administration: Whole meds with puree Supervision: Patient able to self feed;Intermittent supervision to cue for compensatory strategies Compensations: Slow rate;Small sips/bites Postural Changes: Remain upright for at least 30 minutes after po intake;Seated upright at 90 degrees    Other  Recommendations Oral Care Recommendations: Oral care BID Other Recommendations: Order thickener from pharmacy   Follow up Recommendations  (TBD)  Frequency and Duration min 2x/week  2 weeks       Prognosis Prognosis for Safe Diet Advancement: Good      Swallow Study   General HPI: Winston Misner Wilsonis a 83 y.o.malewith medical history significant for Paroxysmal AFib, GERD, hiatal hernia, Barrett's esophagus, multiple lung nodules, bladder CA, non-Hodgkin's lymphoma, OA, hyperlipidemia who presented to the ED with c/o  generalized weakness and fall. Chest x-ray showed possible CHF with superimposed pneumonia in the middle left lung base. Head CT showed no acute abnormalities, small vessel ischemic disease white matter changes,and ethmoidal paranasal sinus disease. Barium esophagram poor esophageal motility. No stricture or mass. Barium tablet passed readily into the stomach. Negative for reflux. Type of Study: Bedside Swallow Evaluation Previous Swallow Assessment:  (none) Diet Prior to this Study: NPO Temperature Spikes Noted: No Respiratory Status: Room air History of Recent Intubation: No Behavior/Cognition: Alert;Pleasant mood;Cooperative Oral Cavity Assessment: Within Functional Limits Oral Care Completed by SLP: No Oral Cavity - Dentition: Adequate natural dentition Vision: Functional for self-feeding Self-Feeding Abilities: Able to feed self Patient Positioning: Upright in bed Baseline Vocal Quality: Normal Volitional Cough: Strong Volitional Swallow: Able to elicit    Oral/Motor/Sensory Function Overall Oral Motor/Sensory Function: Within functional limits   Ice Chips Ice chips: Not tested   Thin Liquid Thin Liquid: Impaired Presentation: Cup Oral Phase Impairments:  (none) Oral Phase Functional Implications:  (none) Pharyngeal  Phase Impairments: Cough - Immediate;Cough - Delayed (audible swallow)    Nectar Thick Nectar Thick Liquid: Within functional limits   Honey Thick Honey Thick Liquid: Not tested   Puree Puree: Within functional limits   Solid   GO   Solid: Within functional limits    Functional Assessment Tool Used: skilled clinical judgement Functional Limitations: Swallowing Swallow Current Status (U0454): At least 40 percent but less than 60 percent impaired, limited or restricted Swallow Goal Status [phone]): At least 20 percent but less than 40 percent impaired, limited or restricted   [name] 06/16/2016,10:20 AM  [name] [name].[employer]  [phone]

## 2016-06-16 NOTE — Consult Note (Addendum)
NEURO HOSPITALIST CONSULT NOTE   Requestig physician: Dr. Saralyn Pilar   Reason for Consult: synope   History obtained from:   Chart     HPI:                                                                                                                                          Jason [name] is an 83 y.o. male with medical history significant for Paroxysmal AFib on eliquis, GERD, Barrett's esophagus, bladder CA, non-Hodgkin's lymphoma, OA, hyperlipidemia who presented to the ED with c/o generalized weakness and fall. Per chart--daughter at bedside who said that she visited with his high at father due to the day, and he was doing well except his blood pressure was somewhat accelerated to 161/101 mmHg. Around 9:00 PM her father, who lives independently in his own condominium called and complained of some weakness, he also was noted to be slow to respond to questions. It was concerning for her and she initiated 911 call, EMS arrived, but the patient refused to be taken to the ED as his symptoms improved. His daughter with her husband came to this pain and last night with the patient and this morning she found him on the floor near the bed. He was becoming progressively weak, unable to get up from the bed and slid on the floor. The daughter called EMS and this time patient was taken to the emergency department. He has chronic cough but during the last 2 or 3 days Worse. A.M.   Today: "While patient was siting in the chair after ambulating down the hall with PT. He told his daughter that he was feeling weak and nauseated.  then suddenly became unresponsive.  Daughter noted some "shaking" of his upper extremities.  She called out to staff who found the patient unresponsive.  Patient was quickly lifted to bed after which he awoke and was responding well to staff.  Code blue team at bedside,  NRB placed on patient.  BP 144/66 HR 73  RR 22 O2 sat 100%. CBG 123.   Attending at bedside.  12 lead  EKG done.  Labs drawn. Left PIV started by IV team. Patient is now alert and oriented.  He is frequently coughing up clear sputum, which he says he has done for years. "    I do not see any orthostatic vital on the chart.   I spoke to the daughter in depth. Apparently he has been having difficulty with swallowing and choking on his food. He has been seen by his PCP and they thought it might be his records esophagus. Patient is also been noticing to drool on the left side of his mouth more than usual. They attributed that also to his Bell's palsy. It was noted that  his his mother did have Parkinson's however naming the symptoms of Parkinson's patient's daughter did not note any significant symptoms that jumped out to her. She has noticed that while he eats he often tires out while chewing, in addition he also will have a hard time holding his head up for prolonged periods of time.  As far as the syncopal spell today, she states that he was sitting in a chair when he suddenly stated he felt nauseous and then passed out. He had mild bilateral upper extremity jerking that only lasted for a few seconds and then he quickly came to. Patient daughter worked in known OB/GYN office and states that she has seen multiple people who have had syncopal episodes with myoclonic jerking and she believes as what this was. Her main concern is his swallowing and progressive difficulty swallowing.  Past Medical History:  Diagnosis Date  . Arthritis   . Barrett esophagus   . Bladder cancer ([place]) dx'd 5625,6389   surg only  . Colon polyps    adenomatous  . Difficulty sleeping    OCCASIONALLY  . Diverticulosis of colon (without mention of hemorrhage)   . Dysrhythmia    INTERMITANT A FIB  . Frequency of urination   . GERD (gastroesophageal reflux disease)   . Hiatal hernia   . History of bladder cancer 1981   EXCISION ONLY  . History of skin cancer    BASAL CELL  . Hyperlipidemia   . Lymphoma, non-Hodgkin's  ([place]) 2011   [place]   . Macular degeneration    LEFT EYE  . Multiple nodules of lung 09/16/2011  . Non Hodgkin's lymphoma ([place]) dx'd 01/03/2010  . Personal history of colonic polyps 07/15/2010   tubular adenoma  . Prostate cancer [facility])    patient denies  . PUD (peptic ulcer disease)   . Recurrent cold sores   . Stomach cancer ([place])    NHL origin    Past Surgical History:  Procedure Laterality Date  . APPENDECTOMY    . bone spur     left shoulder  . CHOLECYSTECTOMY    . KNEE ARTHROPLASTY Left 07/30/2015   Procedure: LEFT TOTAL KNEE ARTHROPLASTY WITH COMPUTER NAVIGATION;  Surgeon: Rod Can, MD;  Location: [facility];  Service: Orthopedics;  Laterality: Left;  . LASER OF PROSTATE W/ GREEN LIGHT PVP    . TENDON REPAIR     right arm  . TRIGGER FINGER RELEASE     bilateral hands  . WRIST SURGERY     Bilateral    Family History  Problem Relation Age of Onset  . Prostate cancer Father   . Bone cancer Brother   . Cancer Brother     back      Social History:  reports that he quit smoking about 49 years ago. He quit smokeless tobacco use about 17 years ago. He reports that he does not drink alcohol or use drugs.  No Known Allergies  MEDICATIONS:  Prior to Admission:  Prescriptions Prior to Admission  Medication Sig Dispense Refill Last Dose  . apixaban (ELIQUIS) 5 MG TABS tablet Take 5 mg by mouth 2 (two) times daily.   06/14/2016 at [place]  . esomeprazole (NEXIUM) 40 MG capsule TAKE 1 CAPSULE (40 MG TOTAL) BY MOUTH 2 (TWO) TIMES DAILY BEFORE A MEAL. 60 capsule 6 06/14/2016 at Unknown time  . fish oil-omega-3 fatty acids 1000 MG capsule Take 1 g by mouth 2 (two) times daily.    06/14/2016 at Unknown time  . loratadine (CLARITIN) 10 MG tablet Take 10 mg by mouth daily as needed for allergies.   Past Week at Unknown time  . Multiple Vitamins-Minerals (ICAPS MV  PO) Take by mouth 2 (two) times daily.     06/14/2016 at Unknown time  . Polyethyl Glycol-Propyl Glycol (SYSTANE) 0.4-0.3 % SOLN Apply 1 drop to eye daily as needed (dry eyes).   Past Week at Unknown time  . simvastatin (ZOCOR) 40 MG tablet Take 40 mg by mouth at bedtime.     06/14/2016 at Unknown time  . tamsulosin (FLOMAX) 0.4 MG CAPS capsule Take 0.4 mg by mouth at bedtime.   06/14/2016 at Unknown time  . traZODone (DESYREL) 50 MG tablet Take 50 mg by mouth at bedtime as needed for sleep.   Past Month at Unknown time   Scheduled: . apixaban  5 mg Oral BID  . azithromycin  500 mg Intravenous Q24H  . cefTRIAXone (ROCEPHIN)  IV  1 g Intravenous Q24H  . docusate sodium  100 mg Oral BID  . omega-3 acid ethyl esters  1 g Oral BID  . pantoprazole  80 mg Oral Q1200  . senna  1 tablet Oral BID  . simvastatin  40 mg Oral QHS  . sodium chloride flush  3 mL Intravenous Q12H  . tamsulosin  0.4 mg Oral QHS     ROS:                                                                                                                                       History obtained from daughter  General ROS: negative for - chills, fatigue, fever, night sweats, weight gain or weight loss Psychological ROS: negative for - behavioral disorder, hallucinations, memory difficulties, mood swings or suicidal ideation Ophthalmic ROS: negative for - blurry vision, double vision, eye pain or loss of vision ENT ROS: negative for - epistaxis, nasal discharge, oral lesions, sore throat, tinnitus or vertigo Allergy and Immunology ROS: negative for - hives or itchy/watery eyes Hematological and Lymphatic ROS: negative for - bleeding problems, bruising or swollen lymph nodes Endocrine ROS: negative for - galactorrhea, hair pattern changes, polydipsia/polyuria or temperature intolerance Respiratory ROS: negative for - cough, hemoptysis, shortness of breath or wheezing Cardiovascular ROS: negative for - chest pain, dyspnea on exertion,  edema or irregular heartbeat Gastrointestinal ROS: negative for - abdominal pain, diarrhea,  hematemesis, nausea/vomiting or stool incontinence Genito-Urinary ROS: negative for - dysuria, hematuria, incontinence or urinary frequency/urgency Musculoskeletal ROS: negative for - joint swelling or muscular weakness Neurological ROS: as noted in HPI Dermatological ROS: negative for rash and skin lesion changes   Blood pressure 139/61, pulse 73, temperature 98 F (36.7 C), resp. rate 20, height 5\' 10"  (1.778 m), weight 233 lb 7.5 oz (105.9 kg), SpO2 97 %.   Neurologic Examination:                                                                                                      HEENT-  Normocephalic, no lesions, without obvious abnormality.  Normal external eye and conjunctiva.  Normal TM's bilaterally.  Normal auditory canals and external ears. Normal external nose, mucus membranes and septum.  Normal pharynx. Cardiovascular- irregularly irregular rhythm, pulses palpable throughout   Lungs- chest clear, no wheezing, rales, normal symmetric air entry Abdomen- normal findings: bowel sounds normal Extremities- no edema Lymph-no adenopathy palpable Musculoskeletal-no joint tenderness, deformity or swelling Skin-warm and dry, no hyperpigmentation, vitiligo, or suspicious lesions  Neurological Examination Mental Status: drowsy, oriented.  Speech dysarthic without evidence of aphasia.  Able to follow 3 step commands without difficulty. Cranial Nerves: II:  Visual fields grossly normal, pupils equal, round, reactive to light and accommodation III,IV, VI: Bilateral eyelids are toast. If I had him look vertical for prolonged periods times I do notice that bilateral right greater than left eyelids started to droop., extra-ocular motions intact bilaterally V,VII: smile asymmetric on the left however daughter states that he has a history of Bell's palsy, facial light touch sensation normal  bilaterally VIII: hearing normal bilaterally IX,X: uvula rises symmetrically XI: bilateral shoulder shrug XII: midline tongue extension Motor: Right : Upper extremity   5/5    Left:     Upper extremity   5/5  Lower extremity   5/5     Lower extremity   5/5 --After having patient rapidly pump his arm for approximately 5-20 seconds I tested his strength again and it was noticeably weaker than previous Tone and bulk:normal tone throughout; no atrophy noted Sensory: Pinprick and light touch intact throughout, bilaterally Deep Tendon Reflexes: 2+ and symmetric throughout Plantars: Right: downgoing   Left: downgoing Cerebellar: normal finger-to-nose, normal rapid alternating movements and normal heel-to-shin test Gait: Not tested      Lab Results: Basic Metabolic Panel:  Recent Labs Lab 06/15/16 0726 06/15/16 1052 06/16/16 1245  NA 138  --  135  K 3.8  --  3.8  CL 105  --  104  CO2 22  --  21*  GLUCOSE 102*  --  121*  BUN 20  --  19  CREATININE 1.06  --  1.12  CALCIUM 9.0  --  8.7*  MG  --  2.1 1.9    Liver Function Tests:  Recent Labs Lab 06/15/16 0726  AST 19  ALT 11*  ALKPHOS 31*  BILITOT 0.7  PROT 6.6  ALBUMIN 3.3*   No results for input(s): LIPASE, AMYLASE in the last 168 hours. No results  for input(s): AMMONIA in the last 168 hours.  CBC:  Recent Labs Lab 06/15/16 0726 06/16/16 1245  WBC 8.6 10.5  NEUTROABS 5.4  --   HGB 12.0* 12.8*  HCT 37.4* 39.7  MCV 85.0 84.3  PLT 173 182    Cardiac Enzymes:  Recent Labs Lab 06/15/16 0726 06/16/16 1245  TROPONINI 0.04* 0.04*    Lipid Panel: No results for input(s): CHOL, TRIG, HDL, CHOLHDL, VLDL, LDLCALC in the last 168 hours.  CBG:  Recent Labs Lab 06/16/16 1230  GLUCAP 123*    Microbiology: Results for orders placed or performed during the hospital encounter of 06/15/16  Culture, sputum-assessment     Status: None   Collection Time: 06/16/16  8:58 AM  Result Value Ref Range Status    Specimen Description SPUTUM  Final   Special Requests NONE  Final   Sputum evaluation   Final    Sputum specimen not acceptable for testing.  Please recollect.   Gram Stain Report Called to,Read Back By and Verified With: RN Micheline Rough [phone] [id number] MLM    Report Status 06/16/2016 FINAL  Final    Coagulation Studies: No results for input(s): LABPROT, INR in the last 72 hours.  Imaging: Dg Chest 2 View  Result Date: 06/15/2016 CLINICAL DATA:  Dizziness and confusion EXAM: CHEST  2 VIEW COMPARISON:  Aug 09, 2013 FINDINGS: There is cardiomegaly with mild pulmonary venous hypertension. There is mild interstitial edema in the lung bases. There is focal airspace consolidation in the medial left base. Lungs elsewhere clear. No adenopathy. No bone lesions. IMPRESSION: Findings consistent with a degree of congestive heart failure. Suspect superimposed pneumonia medial left base. Followup PA and lateral chest radiographs recommended in 3-4 weeks following trial of antibiotic therapy to ensure resolution and exclude underlying malignancy. Electronically Signed   By: Lowella Grip III M.D.   On: 06/15/2016 07:58   Ct Head Wo Contrast  Result Date: 06/15/2016 CLINICAL DATA:  Lower extremity weakness.  Fall. EXAM: CT HEAD WITHOUT CONTRAST TECHNIQUE: Contiguous axial images were obtained from the base of the skull through the vertex without intravenous contrast. COMPARISON:  Head CT November 15, 2015 FINDINGS: Brain: Moderate diffuse atrophy is stable. There is no intracranial mass, hemorrhage, extra-axial fluid collection, or midline shift. There is mild patchy small vessel disease in the centra semiovale bilaterally. Elsewhere gray-white compartments appear normal. No acute infarct evident. Vascular: No hyperdense vessel. There are foci of calcification in each carotid siphon region as well as in each distal vertebral artery. Skull: The bony calvarium appears intact. Sinuses/Orbits: There is mucosal  thickening and opacification in multiple ethmoid air cells. There is mucosal thickening in the anteromedial right sphenoid sinus. Visualized paranasal sinuses elsewhere are clear. Visualized orbits appear symmetric bilaterally with the exception of apparent cataract removal on the right. Other: Mastoid air cells are clear. IMPRESSION: Atrophy with relatively mild periventricular small vessel disease, stable. No intracranial mass, hemorrhage, or extra-axial fluid collection. No acute appearing infarct. Areas of arterial vascular calcification noted. There is paranasal sinus disease, primarily in multiple ethmoid air cells. Electronically Signed   By: Lowella Grip III M.D.   On: 06/15/2016 08:10       Assessment and plan per attending neurologist  Etta Quill PA-C [facility] [phone]  06/16/2016, 2:34 PM   Assessment/Plan: This is a 83 year old male with progressive weakness, progressive difficulty swallowing, increased drooling, progressive decreased ability to keep his head in an upright position. Today he suffered a syncopal event while  sitting in his chair. There was no postictal time. However no orthostatic vital signs have been taken at this time. His head CT scan did not show any acute abnormalities. He does have a left facial droop which was thought to be Bell's palsy.  Etiology of these multiple issues is unclear at this time. Differential diagnosis includes myasthenia gravis, orthostatic hypotension, possible Parkinson's disease with autonomic instability, and seizure.  Recommend: 1-EEG 2-MRI brain without contrast 3-myasthenia panel 4-orthostatic vital signs 5-outpatient visit with neurology to further evaluate for possible Parkinson's disease if all else is negative  Addendum: I have examined the patient and read the above note and fully agree with assessment and plan.  My only addition would be that the telemetry showed clear bradyarrhythmia and trigeminy  around the time of the event and this could certainly have been the cause of the observed syncopal spell.  I would recommend a cardiology consultation for this patient given the telemetry findings.  Also fully agree that this patient could have MG, and mestinon might help diagnose and treat.  We will treat with 30mg  TID and will reexamine tomorrow. One other consideration here is of course ALS, and as stated in above note, PD. I think the MRI will be helpful.  Will also send a brief workup for other myopathies, including CK.  Regardless of our findings this patient will need an EMG/NCS at an outpatient neurology office and this should be set up for him before discharge.  D/w Dr Carolin Sicks

## 2016-06-16 NOTE — Progress Notes (Signed)
ANTICOAGULATION CONSULT NOTE - Follow Up Consult  Pharmacy Consult for heparin Indication: atrial fibrillation  Labs:  Recent Labs  06/15/16 0726 06/15/16 2026 06/16/16 0430  HGB 12.0*  --   --   HCT 37.4*  --   --   PLT 173  --   --   APTT  --  49* 84*  HEPARINUNFRC  --  0.70 0.90*  CREATININE 1.06  --   --   TROPONINI 0.04*  --   --     Assessment/Plan:  83yo male therapeutic on heparin with initial dosing while Eliquis on hold. Will continue gtt at current rate and confirm stable with additional PTT.   Wynona Neat, PharmD, BCPS  06/16/2016,5:20 AM

## 2016-06-16 NOTE — Progress Notes (Signed)
Daughter called that pt was seizing, RN was in room and found pt unresponsive, rapid response alerted, MD paged to room. Primary nurse in room at the time pt was awake and alert. Pt was a little bit upset about situation. Vital signs at the time was BP, 144/66, pulse rate, 73, resp, 20, 02, 100% on Non-rebreather mask. CBG, 123. 12 leads EKG done. Pt has been transferred to [facility] room 25. For more intensive care.

## 2016-06-16 NOTE — Evaluation (Addendum)
Physical Therapy Evaluation Patient Details Name: Jason [name] MRN: [id number] DOB: [birth year] Today's Date: 06/16/2016   History of Present Illness  Jason [name] is a 83 y.o. male with medical history significant for Paroxysmal AFib on eliquis, GERD, Barrett's esophagus, bladder CA, non-Hodgkin's lymphoma, OA, hyperlipidemia who presented to the ED with c/o generalized weakness and fall.  Clinical Impression  Pt admitted with/for generalized weakness and fall.  Pt currently at a heavy min to mod assist level.  Pt currently limited functionally due to the problems listed. ( See problems list.)   Pt will benefit from PT to maximize function and safety in order to get ready for next venue listed below.     Follow Up Recommendations SNF    Equipment Recommendations  None recommended by PT    Recommendations for Other Services       Precautions / Restrictions Precautions Precautions: Fall      Mobility  Bed Mobility Overal bed mobility: Needs Assistance Bed Mobility: Supine to Sit     Supine to sit: Min assist;HOB elevated     General bed mobility comments: up via R elbow with assist needed to stabilize trunk and assist forward onto R elbow  Transfers Overall transfer level: Needs assistance   Transfers: Sit to/from Stand           General transfer comment: assist to come forward, pt tends to push backward.  Ambulation/Gait Ambulation/Gait assistance: Min assist Ambulation Distance (Feet): 100 Feet Assistive device: Rolling walker (2 wheeled) Gait Pattern/deviations: Step-through pattern     General Gait Details: generally steady, but with cues needed for improved safety when pt walking out side of the RW especially during turns.  Stairs            Wheelchair Mobility    Modified Rankin (Stroke Patients Only)       Balance Overall balance assessment: Needs assistance   Sitting balance-Leahy Scale: Fair     Standing balance support:  Bilateral upper extremity supported Standing balance-Leahy Scale: Poor Standing balance comment: rliant on the RW                             Pertinent Vitals/Pain Pain Assessment: No/denies pain    Home Living Family/patient expects to be discharged to:: Private residence Living Arrangements: Alone Available Help at Discharge: Family;Available PRN/intermittently Type of Home: House Home Access: Stairs to enter   Entrance Stairs-Number of Steps: 1 Home Layout: One level Home Equipment: Walker - 2 wheels;Bedside commode      Prior Function Level of Independence: Independent;Independent with assistive device(s)               Hand Dominance        Extremity/Trunk Assessment   Upper Extremity Assessment Upper Extremity Assessment: Defer to OT evaluation    Lower Extremity Assessment Lower Extremity Assessment: Overall WFL for tasks assessed (proximal weaknesses)    Cervical / Trunk Assessment Cervical / Trunk Assessment: Kyphotic  Communication   Communication: HOH  Cognition Arousal/Alertness: Awake/alert Behavior During Therapy: WFL for tasks assessed/performed Overall Cognitive Status: Within Functional Limits for tasks assessed                      General Comments      Exercises     Assessment/Plan    PT Assessment Patient needs continued PT services  PT Problem List Decreased strength;Decreased activity tolerance;Decreased balance;Decreased mobility;Decreased coordination;Decreased knowledge of  use of DME       PT Treatment Interventions Gait training;DME instruction;Functional mobility training;Therapeutic activities;Therapeutic exercise;Patient/family education    PT Goals (Current goals can be found in the Care Plan section)  Acute Rehab PT Goals Patient Stated Goal: back home ultimately PT Goal Formulation: With patient Time For Goal Achievement: 06/30/16 Potential to Achieve Goals: Good    Frequency Min 3X/week    Barriers to discharge        Co-evaluation               End of Session   Activity Tolerance: Patient tolerated treatment well Patient left: in chair;with call bell/phone within reach;with chair alarm set;with family/visitor present Nurse Communication: Mobility status PT Visit Diagnosis: Unsteadiness on feet (R26.81);Other abnormalities of gait and mobility (R26.89)    Functional Assessment Tool Used: AM-PAC 6 Clicks Basic Mobility Functional Limitation: Mobility: Walking and moving around Mobility: Walking and Moving Around Current Status (E7209): At least 20 percent but less than 40 percent impaired, limited or restricted Mobility: Walking and Moving Around Goal Status [phone]): At least 1 percent but less than 20 percent impaired, limited or restricted    Time: 1111-1146 PT Time Calculation (min) (ACUTE ONLY): 35 min   Charges:   PT Evaluation $PT Eval Moderate Complexity: 1 Procedure PT Treatments $Gait Training: 8-22 mins   PT G Codes:   PT G-Codes **NOT FOR INPATIENT CLASS** Functional Assessment Tool Used: AM-PAC 6 Clicks Basic Mobility Functional Limitation: Mobility: Walking and moving around Mobility: Walking and Moving Around Current Status (G8366): At least 20 percent but less than 40 percent impaired, limited or restricted Mobility: Walking and Moving Around Goal Status [phone]): At least 1 percent but less than 20 percent impaired, limited or restricted     Burnard Bunting 06/16/2016, 12:39 PM 06/16/2016  Donnella Sham, PT [phone]  (pager)

## 2016-06-16 NOTE — Code Documentation (Signed)
CODE BLUE NOTE  Patient Name: Jason [name]   MRN: [id number]   Date of Birth/ Sex: 12-30-1927 , male      Admission Date: 06/15/2016  Attending Provider: Rosita Fire, MD  Primary Diagnosis: CAP (community acquired pneumonia)    Indication: Pt was in his usual state of health until this AM, when he was noted to be unresponsive by his daughter. Code blue was subsequently called. At the time of arrival on scene, ACLS protocol was underway and patient became responsive upon placement of backboard beneath him.    Technical Description:  - CPR performance duration:  0 minutes  - Was defibrillation or cardioversion used? No   - Was external pacer placed? No  - Was patient intubated pre/post CPR? No   Medications Administered: Y = Yes; Blank = No Amiodarone    Atropine    Calcium    Epinephrine    Lidocaine    Magnesium    Norepinephrine    Phenylephrine    Sodium bicarbonate    Vasopressin     Post CPR evaluation:  - Final Status - Was patient successfully resuscitated ? Yes - What is current rhythm? Sinus rhythm  - What is current hemodynamic status? Stable   Miscellaneous Information:  - Labs sent, including: BMET, CBC, Troponins, 12-lead EKG ordered stat.     - Primary team notified?  Yes  - Family Notified? Yes  - Additional notes/ transfer status:  CBG 120s, BP 144/66 and patient currently awake and alert.       Lovenia Kim, MD , PGY-1 06/16/2016, 12:45 PM

## 2016-06-16 NOTE — Progress Notes (Signed)
Pt received from [facility] per bed placed on monitor CHG bath and MRSA swab done

## 2016-06-16 NOTE — Progress Notes (Signed)
PROGRESS NOTE    Jason [name]  WUJ:[id number] DOB: 09/10/1927 DOA: 06/15/2016 PCP: Melinda Crutch, MD   Brief Narrative:  83 y.o. male with medical history significant for Paroxysmal AFib on eliquis, GERD, Barrett's esophagus, bladder CA, non-Hodgkin's lymphoma, OA, hyperlipidemia who presented to the ED with c/o generalized weakness and fall. Chest x-ray showed possible CHF with superimposed pneumonia in the middle left lung base Head CT showed no acute abnormalities, small vessel ischemic disease white matter changes, and ethmoidal paranasal sinus disease.  Assessment & Plan:   # Community acquired pneumonia: Continue ceftriaxone and azithromycin and bronchodilators. Continue to monitor for clinical progress. Patient is not hypoxic. Follow up culture results.  #Paroxysmal atrial fibrillation: Currently on dysphagia diet therefore continue Eliquis. Monitor in telemetry.  #Episodes of unresponsiveness: Exact etiology unknown. As per patient's daughter, patient was suddenly blanking out and became unresponsive. There was some movement of his upper body but the daughter was not able to specifically explained. This is concerning for possible seizure disorder. Checking CBC, BMP, troponin. EKG unchanged. When I examined patient immediately he was alert awake and oriented back to baseline. He has no focal neurological deficit. I ordered EEG and consulted neurologist. I will defer this to neurologist for further imaging studies and workup. I discussed with Dr. Wendee Beavers. -Follow up echocardiogram. -Frequent neuro check him a seizure precautions and will transfer patient to start on for close monitoring.  #Hyperlipidemia: Continue statin  #History of GERD: Continue PPI  # History of BPH: Continue Flomax and monitor.  #Fall, generalized weakness and physical deconditioning: PT, OT and social worker evaluation. Patient likely needs rehabilitation or SNF on discharge. Swallow evaluation ongoing. Patient is  on dysphagia level III diet. Continue to monitor closely.  Principal Problem:   CAP (community acquired pneumonia) Active Problems:   Hyperlipidemia   GERD (gastroesophageal reflux disease)   BPH (benign prostatic hyperplasia)   PAF (paroxysmal atrial fibrillation) (HCC)   Pressure injury of skin   Lobar pneumonia (HCC)    DVT prophylaxis:Systemic anticoagulation  Code Status:Full code  Family Communication:Discussed with the patient's daughter at bedside  Disposition Plan:Likely discharge to rehabilitation/SNF in 1-2 days    Consultants:   Neurologist  Procedures:None  Antimicrobials:Azithromycin and ceftriaxone since 3/14  Subjective: Patient was seen and examined at bedtime. He was seen in the morning around 10 AM when he was alert awake and oriented and reported feeling better. In the afternoon there was brief period of unresponsiveness. When I examined after the episode he was already back to baseline and talking. Denied headache, dizziness, chest pain, shortness of breath, nausea or vomiting. Daughter at bedside.  Objective: Vitals:   06/16/16 0537 06/16/16 1234 06/16/16 1242 06/16/16 1259  BP: (!) 162/69 (!) 144/66 (!) 143/64 139/61  Pulse: 64 73 74 73  Resp: 18   20  Temp: 98 F (36.7 C)     TempSrc:      SpO2: 91% 100% 100% 97%  Weight: 105.9 kg (233 lb 7.5 oz)     Height:        Intake/Output Summary (Last 24 hours) at 06/16/16 1321 Last data filed at 06/16/16 9562  Gross per 24 hour  Intake          1444.83 ml  Output             2675 ml  Net         -1230.17 ml   Filed Weights   06/15/16 0720 06/15/16 1224 06/16/16 0537  Weight:  108.9 kg (240 lb) 106 kg (233 lb 11 oz) 105.9 kg (233 lb 7.5 oz)    Examination:  General exam: Appears calm and comfortable  Respiratory system: Clear to auscultation. Respiratory effort normal. No wheezing or crackle Cardiovascular system: S1 & S2 heard, RRR.  No pedal edema. Gastrointestinal system: Abdomen is  nondistended, soft and nontender. Normal bowel sounds heard. Central nervous system: Alert and oriented. No focal neurological deficits. Extremities: Symmetric 5 x 5 power. Skin: No rashes, lesions or ulcers Psychiatry: Judgement and insight appear normal. Mood & affect appropriate.     Data Reviewed: I have personally reviewed following labs and imaging studies  CBC:  Recent Labs Lab 06/15/16 0726 06/16/16 1245  WBC 8.6 10.5  NEUTROABS 5.4  --   HGB 12.0* 12.8*  HCT 37.4* 39.7  MCV 85.0 84.3  PLT 173 527   Basic Metabolic Panel:  Recent Labs Lab 06/15/16 0726 06/15/16 1052  NA 138  --   K 3.8  --   CL 105  --   CO2 22  --   GLUCOSE 102*  --   BUN 20  --   CREATININE 1.06  --   CALCIUM 9.0  --   MG  --  2.1   GFR: Estimated Creatinine Clearance: 61 mL/min (by C-G formula based on SCr of 1.06 mg/dL). Liver Function Tests:  Recent Labs Lab 06/15/16 0726  AST 19  ALT 11*  ALKPHOS 31*  BILITOT 0.7  PROT 6.6  ALBUMIN 3.3*   No results for input(s): LIPASE, AMYLASE in the last 168 hours. No results for input(s): AMMONIA in the last 168 hours. Coagulation Profile: No results for input(s): INR, PROTIME in the last 168 hours. Cardiac Enzymes:  Recent Labs Lab 06/15/16 0726  TROPONINI 0.04*   BNP (last 3 results) No results for input(s): PROBNP in the last 8760 hours. HbA1C: No results for input(s): HGBA1C in the last 72 hours. CBG:  Recent Labs Lab 06/16/16 1230  GLUCAP 123*   Lipid Profile: No results for input(s): CHOL, HDL, LDLCALC, TRIG, CHOLHDL, LDLDIRECT in the last 72 hours. Thyroid Function Tests: No results for input(s): TSH, T4TOTAL, FREET4, T3FREE, THYROIDAB in the last 72 hours. Anemia Panel: No results for input(s): VITAMINB12, FOLATE, FERRITIN, TIBC, IRON, RETICCTPCT in the last 72 hours. Sepsis Labs:  Recent Labs Lab 06/15/16 1052  PROCALCITON <0.10    Recent Results (from the past 240 hour(s))  Culture, sputum-assessment      Status: None   Collection Time: 06/16/16  8:58 AM  Result Value Ref Range Status   Specimen Description SPUTUM  Final   Special Requests NONE  Final   Sputum evaluation   Final    Sputum specimen not acceptable for testing.  Please recollect.   Gram Stain Report Called to,Read Back By and Verified With: RN Micheline Rough [phone] 1153AM MLM    Report Status 06/16/2016 FINAL  Final         Radiology Studies: Dg Chest 2 View  Result Date: 06/15/2016 CLINICAL DATA:  Dizziness and confusion EXAM: CHEST  2 VIEW COMPARISON:  Aug 09, 2013 FINDINGS: There is cardiomegaly with mild pulmonary venous hypertension. There is mild interstitial edema in the lung bases. There is focal airspace consolidation in the medial left base. Lungs elsewhere clear. No adenopathy. No bone lesions. IMPRESSION: Findings consistent with a degree of congestive heart failure. Suspect superimposed pneumonia medial left base. Followup PA and lateral chest radiographs recommended in 3-4 weeks following trial of antibiotic  therapy to ensure resolution and exclude underlying malignancy. Electronically Signed   By: Lowella Grip III M.D.   On: 06/15/2016 07:58   Ct Head Wo Contrast  Result Date: 06/15/2016 CLINICAL DATA:  Lower extremity weakness.  Fall. EXAM: CT HEAD WITHOUT CONTRAST TECHNIQUE: Contiguous axial images were obtained from the base of the skull through the vertex without intravenous contrast. COMPARISON:  Head CT November 15, 2015 FINDINGS: Brain: Moderate diffuse atrophy is stable. There is no intracranial mass, hemorrhage, extra-axial fluid collection, or midline shift. There is mild patchy small vessel disease in the centra semiovale bilaterally. Elsewhere gray-white compartments appear normal. No acute infarct evident. Vascular: No hyperdense vessel. There are foci of calcification in each carotid siphon region as well as in each distal vertebral artery. Skull: The bony calvarium appears intact. Sinuses/Orbits:  There is mucosal thickening and opacification in multiple ethmoid air cells. There is mucosal thickening in the anteromedial right sphenoid sinus. Visualized paranasal sinuses elsewhere are clear. Visualized orbits appear symmetric bilaterally with the exception of apparent cataract removal on the right. Other: Mastoid air cells are clear. IMPRESSION: Atrophy with relatively mild periventricular small vessel disease, stable. No intracranial mass, hemorrhage, or extra-axial fluid collection. No acute appearing infarct. Areas of arterial vascular calcification noted. There is paranasal sinus disease, primarily in multiple ethmoid air cells. Electronically Signed   By: Lowella Grip III M.D.   On: 06/15/2016 08:10        Scheduled Meds: . apixaban  5 mg Oral BID  . azithromycin  500 mg Intravenous Q24H  . cefTRIAXone (ROCEPHIN)  IV  1 g Intravenous Q24H  . docusate sodium  100 mg Oral BID  . omega-3 acid ethyl esters  1 g Oral BID  . pantoprazole  80 mg Oral Q1200  . senna  1 tablet Oral BID  . simvastatin  40 mg Oral QHS  . sodium chloride flush  3 mL Intravenous Q12H  . tamsulosin  0.4 mg Oral QHS   Continuous Infusions: . dextrose 5 % and 0.45% NaCl 75 mL/hr at 06/15/16 1744     LOS: 0 days    Shirlette Scarber Tanna Furry, MD [facility] Pager [phone]  If 7PM-7AM, please contact night-coverage [email or web address] Password [email or web address] 06/16/2016, 1:21 PM

## 2016-06-16 NOTE — Procedures (Signed)
History: 83 year old male being evaluated for syncope.  Sedation: None  Technique: This is a 21 channel routine scalp EEG performed at the bedside with bipolar and monopolar montages arranged in accordance to the international 10/20 system of electrode placement. One channel was dedicated to EKG recording.    Background: The waking background consists of intermixed alpha and beta activities. There is a well defined posterior dominant rhythm of 9 Hz that attenuates with eye opening. There is an increase in slow activity associated with drowsiness .  Photic stimulation: Physiologic driving is present  EEG Abnormalities: None  Clinical Interpretation: This normal EEG is recorded in the waking and drowsy state. There was no seizure or seizure predisposition recorded on this study. Please note that a normal EEG does not preclude the possibility of epilepsy.   Roland Rack, MD [facility] [phone]  If 7pm- 7am, please page neurology on call as listed in [place].

## 2016-06-17 ENCOUNTER — Encounter (HOSPITAL_COMMUNITY): Payer: Self-pay | Admitting: Physician Assistant

## 2016-06-17 ENCOUNTER — Inpatient Hospital Stay (HOSPITAL_COMMUNITY): Payer: Medicare Other

## 2016-06-17 DIAGNOSIS — R4189 Other symptoms and signs involving cognitive functions and awareness: Secondary | ICD-10-CM

## 2016-06-17 DIAGNOSIS — I509 Heart failure, unspecified: Secondary | ICD-10-CM

## 2016-06-17 DIAGNOSIS — R531 Weakness: Secondary | ICD-10-CM

## 2016-06-17 LAB — GLUCOSE, CAPILLARY
GLUCOSE-CAPILLARY: 198 mg/dL — AB (ref 65–99)
GLUCOSE-CAPILLARY: 103 mg/dL — AB (ref 65–99)
Glucose-Capillary: 97 mg/dL (ref 65–99)
Glucose-Capillary: 133 mg/dL — ABNORMAL HIGH (ref 65–99)

## 2016-06-17 LAB — ECHOCARDIOGRAM COMPLETE
HEIGHTINCHES: 70 in
Weight: 3724.89 oz

## 2016-06-17 LAB — PROCALCITONIN: Procalcitonin: <0.1 ng/mL

## 2016-06-17 NOTE — Progress Notes (Signed)
Modified Barium Swallow Progress Note  Patient Details  Name: Jason [name] MRN: [id number] Date of Birth: 29-Sep-1927  Today's Date: 06/17/2016  Modified Barium Swallow completed.  Full report located under Chart Review in the Imaging Section.  Brief recommendations include the following:  Clinical Impression  Pt demonstrates a moderate oropharyngeal dysphagia with neuromuscular deficits impacting sensory and motor function. Pt observed to have bilateral ptosis, anterior spillage of secretions on the left (family reports history of left facial droop due to Bell's palsy) and weak cough strength upon arrival to exam. Despite deficits pt apepars conginitively intact and is able to follow complex commands.   Oral function chaacteraized by mild lingual weakness observed via mild lingual residuals, with functional abiltiy to masticate and transit soft solids (though endurance not taxed). Flash penetration and silent aspriation during the swallow occured with boluses larger than 37ml (estimate) due to slow laryngeal closure and epiglottic deflection. Pt was able trigger a timely swallow and eventually achieve full laryngeal closure which expelled penetrate when possible, but again sluggish movement kept vestible partialy open during bolus passage. Further silent aspiration events also occured after the swallow if pt was cued to cough, which spilled valleuular residual into the airway. Attempted chin tuck without improvement and mendelsohn with visual biofeedback (pt unable after 5 attempts with max cues). Best methods included verbal cues for small sips (bolus size consistently comparable in appearance and fucntion with 31mL teaspoon), oral hold and effortful swallow x2. Given increased residuals with thicker textures, recommend pt initiate a dys 2/thin liquid diet. Recommend water only with oral care at least 3x day with toothbrush and paste. Pt will benefit from exercises to target hyolaryngeal excursion and  base of tongue strength as well as EMST. Will f/u for education with family.    Swallow Evaluation Recommendations       SLP Diet Recommendations: Dysphagia 2 (Fine chop) solids;Thin liquid   Liquid Administration via: Cup   Medication Administration: Whole meds with puree   Supervision: Patient able to self feed;Staff to assist with self feeding;Full supervision/cueing for compensatory strategies   Compensations: Slow rate;Small sips/bites;Effortful swallow;Minimize environmental distractions;Clear throat intermittently   Postural Changes: Remain semi-upright after after feeds/meals (Comment)   Oral Care Recommendations: Oral care before and after PO       [name], MA CCC-SLP [phone]  Dariyah Garduno, Katherene Ponto 06/17/2016,2:19 PM

## 2016-06-17 NOTE — Progress Notes (Addendum)
Subjective: Patient and wife state the trial of Mestinon was of no benefit. Patient did not feel as though his drooling, swallowing, strength had improved at all.  Exam: Vitals:   06/17/16 0815 06/17/16 1025  BP: (!) 152/61 (!) 98/49  Pulse: 65 68  Resp: 16 (!) 25  Temp: 97.8 F (36.6 C)       Neurological Examination Mental Status: drowsy, oriented.  Speech dysarthic without evidence of aphasia.  Able to follow 3 step commands without difficulty. Cranial Nerves: II:  Visual fields grossly normal, pupils equal, round, reactive to light and accommodation III,IV, VI: Bilateral eyelids are ptosed V,VII: smile asymmetric on the left however daughter states that he has a history of Bell's palsy, facial light touch sensation normal bilaterally VIII: hearing normal bilaterally IX,X: uvula rises symmetrically XI: bilateral shoulder shrug XII: midline tongue extension Motor: Right :  Upper extremity   5/5                                      Left:     Upper extremity   5/5             Lower extremity   5/5                                                  Lower extremity   5/5  Tone and bulk:normal tone throughout; no atrophy noted Sensory: Pinprick and light touch intact throughout, bilaterally Deep Tendon Reflexes: 2+ and symmetric throughout Plantars: Right: downgoing                                Left: downgoing Cerebellar: normal finger-to-nose, normal rapid alternating movements and normal heel-to-shin test Gait: Not tested      Pertinent Labs/Diagnostics: EEG showed no epileptiform activity MRI did not show any mass bleed or acute infarct--however did show moderate chronic microvascular ischemic changes and mild parenchymal volume loss of the brain. Small chronic lacunar infarcts within the right lentiform nucleus and left caudate head.  Etta Quill PA-C [facility] [phone]  Impression: 83 year old male with progressive weakness, progressive difficulty  swallowing, increased drooling, progressive decreased ability to keep his head in an upright position. Today he suffered a syncopal event while sitting in his chair. There was no postictal time. However no orthostatic vital signs have been taken at this time. His head CT scan did not show any acute abnormalities. He does have a left facial droop which was thought to be Bell's palsy.  Etiology at this time is still very unclear. His multiple tests that have been run shown to be negative. We'll obtain an MRI of his cervical spine. As stated in previous note,would recommend a cardiology consultation for this patient given the telemetry findings.   unfortunately the Mestinon was of no benefit at this time we'll stop the Mestinon.One other consideration here is of course ALS, and as stated in above note, PD. Regardless of our findings this patient will need an EMG/NCS at an outpatient neurology office and this should be set up for him before discharge.      06/17/2016, 10:59 AM

## 2016-06-17 NOTE — Progress Notes (Signed)
  Speech Language Pathology Treatment: Dysphagia  Patient Details Name: Jason [name] MRN: [id number] DOB: 25-Nov-1927 Today's Date: 06/17/2016 Time: 5638-7564 SLP Time Calculation (min) (ACUTE ONLY): 36 min  Assessment / Plan / Recommendation Clinical Impression  Pt seen for f/u after MBS to provided education to family and pt regarding necessary precautions strategies and exercise program for findings of moderate to severe oropharyngeal dysphagia on MBS. SLP provided verbal and written instruction to daughter regarding oral care methods, safe swallowing strategies, and exercise program. Pt was able to verbal recall of strategies since MBS and demonstrate oral care and small single sips of water with two effortful swallows with moderate verbal cues. Also guided pt in EMST with blue Respironics device set at 18 cmH20  (Pt unable to complete effective use of EMST 150 set at lowest setting, but was successful over 5 trials on Respironics device at 20).  Demonstrated chin tuck against resistance, pt able to complete one isometric hold for 30 seconds and 10 repetitions of chin lifts and tucks. Provided written instruction for family to guide pt in exercises over the weekend. Discussed precautions with RN as well. Will f/u .   HPI HPI: Jason [name] a 83 y.o.malewith medical history significant for Paroxysmal AFib, GERD, hiatal hernia, Barrett's esophagus, multiple lung nodules, bladder CA, non-Hodgkin's lymphoma, OA, hyperlipidemia who presented to the ED with c/o generalized weakness and fall. Chest x-ray showed possible CHF with superimposed pneumonia in the middle left lung base. Head CT showed no acute abnormalities, small vessel ischemic disease white matter changes,and ethmoidal paranasal sinus disease. Barium esophagram poor esophageal motility. No stricture or mass. Barium tablet passed readily into the stomach. Negative for reflux.      SLP Plan  Continue with current plan of care        Recommendations  Diet recommendations: Thin liquid;Dysphagia 2 (fine chop) Liquids provided via: Cup;No straw Medication Administration: Whole meds with puree Supervision: Patient able to self feed;Full supervision/cueing for compensatory strategies Compensations: Slow rate;Small sips/bites;Effortful swallow;Multiple dry swallows after each bite/sip                Follow up Recommendations: 24 hour supervision/assistance SLP Visit Diagnosis: Dysphagia, oropharyngeal phase (R13.12) Plan: Continue with current plan of care       Jason Greene Diodato, MA CCC-SLP [phone]  Jason [name] 06/17/2016, 3:54 PM

## 2016-06-17 NOTE — Progress Notes (Signed)
PT Cancellation Note  Patient Details Name: Jason [name] MRN: [id number] DOB: [birth year]   Cancelled Treatment:    Reason Eval/Treat Not Completed: Patient at procedure or test/unavailable. Pt having an echo procedure in room. PT will continue to f/u with pt as available.   [place] 06/17/2016, 9:12 AM

## 2016-06-17 NOTE — Progress Notes (Signed)
  Echocardiogram 2D Echocardiogram has been performed.  Jason [name] 06/17/2016, 10:08 AM

## 2016-06-17 NOTE — Progress Notes (Signed)
OT Cancellation Note  Patient Details Name: Jason [name] MRN: [id number] DOB: Jul 21, 1927   Cancelled Treatment:    Reason Eval/Treat Not Completed: Fatigue/lethargy limiting ability to participate;Patient at procedure or test/ unavailable. Attempted x 3 this date. Will continue to follow.  Malka So 06/17/2016, 3:45 PM  [phone]

## 2016-06-17 NOTE — Consult Note (Signed)
CARDIOLOGY CONSULT NOTE   Patient ID: Jason [name] MRN: [id number] DOB/AGE: November 23, 1927 83 y.o.  Admit date: 06/15/2016  Requesting Physician: Dr. Carolin Sicks Primary Physician:   Melinda Crutch, MD Primary Cardiologist: New Reason for Consultation: syncope and bradycardia noted on tele  HPI: Jason [name] is a 83 y.o. male with a history of PAF on Eliquis, obesity, bladder cancer, non-Hodgkin's lymphoma, OA, and hyperlipidemia who presented [facility] via EMS on 06/15/16 for evaluation of weakness. He had a syncopal episode yesterday and bradycardia noted on tele and cardiology consulted.   He is not followed by a cardiologist. His PCP manages his PAF and anticoagulation. He was in his usual state of health until 06/15/16 when he called his daughter to come over because he was "not feeling good." This particular daughter provided much of the history in the chart and is not present in the room today for my interview, but his other daughter was there. Patient is a difficult historian and is only able to tell me that he was feeling "sick, " but no other details  Per review of records and discussion with patient and other daughter today; he was not feeling well on the day of admission with generalized weakness. Apparently, he initially refused to come to the ED but after he slid out of the bed and was unable to get himself up he allowed his daughter to call EMS.    On arrival to hospital his troponin noted to be 0.04--> 0.04, BNP mildly elevated ~300. CXR with possible PNA and pulmonary vascular congestion. He was given one dose of IV lasix and started on empiric abx for possible PNA. Apparently, he has been having difficulty with swallowing and choking on his food. He has been seen by his PCP and they thought it might be his records esophagus. Patient is also been noticing to drool on the left side of his mouth more than usual. They attributed that also to his Bell's palsy. It was noted that his his  mother did have Parkinson's however naming the symptoms of Parkinson's patient's daughter did not note any significant symptoms that jumped out to her. She has noticed that while he eats he often tires out while chewing, in addition he also will have a hard time holding his head up for prolonged periods of time.  While patient was siting in the chair after ambulating down the hall with PT. He told his daughter that he was feeling weak and nauseated. then suddenly became unresponsive. Daughter noted some "shaking" of his upper extremities. She called out to staff who found the patient unresponsive. Patient was quickly lifted to bed after which he awoke and was responding well to staff. Code blue team at bedside, NRB placed on patient. BP 144/66 HR 73 RR 22 O2 sat 100%. CBG 123.   Neurology was consulted who felt differential included myasthenia gravis, orthostatic hypotension, possible Parkinson's disease with autonomic instability, and seizure. Mestinon challenge was negative. CT and MRI have been unremarkable. EEG negATive. Dr. Wendee Beavers noted a bradyarrhythmia with HRs down into high 40s-50s as well as ventricular ectopy and recommended that cariology be consulted.   2D ECHO showed normal LV function with severe LVH. Mildly dilated RV with normal systolic function. No significant valvular abnormalities.  Currently he is feeling fine. No CP or SOB. No LE edema, orthopnea or PND. No further dizziness or syncope. No blood in stool or urine. No palpitations.    Past Medical History:  Diagnosis Date  . Arthritis   . Barrett esophagus   . Bladder cancer ([place]) dx'd [id number],9604   surg only  . Colon polyps    adenomatous  . Difficulty sleeping    OCCASIONALLY  . Diverticulosis of colon (without mention of hemorrhage)   . Frequency of urination   . GERD (gastroesophageal reflux disease)   . Hiatal hernia   . History of bladder cancer 1981   EXCISION ONLY  . History of skin cancer    BASAL CELL    . Hyperlipidemia   . Lymphoma, non-Hodgkin's ([place]) 2011   [place]   . Macular degeneration    LEFT EYE  . Multiple nodules of lung 09/16/2011  . PAF (paroxysmal atrial fibrillation) ([place])   . Personal history of colonic polyps 07/15/2010   tubular adenoma  . Prostate cancer [facility])    patient denies  . PUD (peptic ulcer disease)   . Stomach cancer ([place])    NHL origin     Past Surgical History:  Procedure Laterality Date  . APPENDECTOMY    . bone spur     left shoulder  . CHOLECYSTECTOMY    . KNEE ARTHROPLASTY Left 07/30/2015   Procedure: LEFT TOTAL KNEE ARTHROPLASTY WITH COMPUTER NAVIGATION;  Surgeon: Rod Can, MD;  Location: [facility] ORS;  Service: Orthopedics;  Laterality: Left;  . LASER OF PROSTATE W/ GREEN LIGHT PVP    . TENDON REPAIR     right arm  . TRIGGER FINGER RELEASE     bilateral hands  . WRIST SURGERY     Bilateral    No Known Allergies  I have reviewed the patient's current medications . apixaban  5 mg Oral BID  . azithromycin  500 mg Intravenous Q24H  . cefTRIAXone (ROCEPHIN)  IV  1 g Intravenous Q24H  . docusate sodium  100 mg Oral BID  . omega-3 acid ethyl esters  1 g Oral BID  . pantoprazole  80 mg Oral Q1200  . senna  1 tablet Oral BID  . simvastatin  40 mg Oral QHS  . sodium chloride flush  3 mL Intravenous Q12H  . tamsulosin  0.4 mg Oral QHS    acetaminophen **OR** acetaminophen, ondansetron **OR** ondansetron (ZOFRAN) IV, RESOURCE THICKENUP CLEAR, traZODone  Prior to Admission medications   Medication Sig Start Date End Date Taking? Authorizing Provider  apixaban (ELIQUIS) 5 MG TABS tablet Take 5 mg by mouth 2 (two) times daily.   Yes Historical Provider, MD  esomeprazole (NEXIUM) 40 MG capsule TAKE 1 CAPSULE (40 MG TOTAL) BY MOUTH 2 (TWO) TIMES DAILY BEFORE A MEAL. 03/17/16  Yes Irene Shipper, MD  fish oil-omega-3 fatty acids 1000 MG capsule Take 1 g by mouth 2 (two) times daily.    Yes Historical Provider, MD  loratadine (CLARITIN) 10  MG tablet Take 10 mg by mouth daily as needed for allergies.   Yes Historical Provider, MD  Multiple Vitamins-Minerals (ICAPS MV PO) Take by mouth 2 (two) times daily.     Yes Historical Provider, MD  Polyethyl Glycol-Propyl Glycol (SYSTANE) 0.4-0.3 % SOLN Apply 1 drop to eye daily as needed (dry eyes).   Yes Historical Provider, MD  simvastatin (ZOCOR) 40 MG tablet Take 40 mg by mouth at bedtime.     Yes Historical Provider, MD  tamsulosin (FLOMAX) 0.4 MG CAPS capsule Take 0.4 mg by mouth at bedtime.   Yes Historical Provider, MD  traZODone (DESYREL) 50 MG tablet Take 50 mg by mouth at  bedtime as needed for sleep.   Yes Historical Provider, MD     Social History   Social History  . Marital status: Widowed    Spouse name: N/A  . Number of children: 2  . Years of education: N/A   Occupational History  . retired   .  Retired   Social History Main Topics  . Smoking status: Former Smoker    Quit date: 10/29/1966  . Smokeless tobacco: Former Systems developer    Quit date: 06/29/1998  . Alcohol use No  . Drug use: No  . Sexual activity: Not on file   Other Topics Concern  . Not on file   Social History Narrative  . No narrative on file    Family Status  Relation Status  . Sister Alive  . Brother Deceased  . Father   . Brother   . Brother    Family History  Problem Relation Age of Onset  . Prostate cancer Father   . Bone cancer Brother   . Cancer Brother     back    ROS:  Full 14 point review of systems complete and found to be negative unless listed above.  Physical Exam: Blood pressure 112/77, pulse 69, temperature 97.8 F (36.6 C), temperature source Oral, resp. rate 13, height 5\' 10"  (1.778 m), weight 232 lb 12.9 oz (105.6 kg), SpO2 97 %.  General: Well developed, well nourished, male in no acute distress, obese, chronically ill appearing Head: Eyes PERRLA, No xanthomas.   Normocephalic and atraumatic, oropharynx without edema or exudate. Lungs: CTAB Heart: HRRR S1 S2, no  rub/gallop, Heart irregular rate and rhythm with S1, S2 no murmur. pulses are 2+ extrem.   Neck: No carotid bruits. No lymphadenopathy. no JVD. Abdomen: Bowel sounds present, abdomen soft and non-tender without masses or hernias noted. Msk:  No spine or cva tenderness. No weakness, no joint deformities or effusions. Extremities: No clubbing or cyanosis. No LE edema.  Neuro: Alert and oriented X 3. No focal deficits noted. Psych:  Good affect, responds appropriately Skin: No rashes or lesions noted.  Labs:   Lab Results  Component Value Date   WBC 10.5 06/16/2016   HGB 12.8 (L) 06/16/2016   HCT 39.7 06/16/2016   MCV 84.3 06/16/2016   PLT 182 06/16/2016   No results for input(s): INR in the last 72 hours.  Recent Labs Lab 06/15/16 0726 06/16/16 1245  NA 138 135  K 3.8 3.8  CL 105 104  CO2 22 21*  BUN 20 19  CREATININE 1.06 1.12  CALCIUM 9.0 8.7*  PROT 6.6  --   BILITOT 0.7  --   ALKPHOS 31*  --   ALT 11*  --   AST 19  --   GLUCOSE 102* 121*  ALBUMIN 3.3*  --    Magnesium  Date Value Ref Range Status  06/16/2016 2.1 1.7 - 2.4 mg/dL Final    Recent Labs  06/15/16 0726 06/16/16 1245 06/16/16 2032  CKTOTAL  --   --  125  TROPONINI 0.04* 0.04*  --    No results for input(s): TROPIPOC in the last 72 hours. No results found for: PROBNP No results found for: CHOL, HDL, LDLCALC, TRIG No results found for: DDIMER Lipase  Date/Time Value Ref Range Status  11/02/2015 02:27 AM 39 11 - 51 U/L Final   TSH  Date/Time Value Ref Range Status  06/16/2016 08:32 PM 1.519 0.350 - 4.500 uIU/mL Final    Comment:  Performed by a 3rd Generation assay with a functional sensitivity of <=0.01 uIU/mL.   No results found for: VITAMINB12, FOLATE, FERRITIN, TIBC, IRON, RETICCTPCT  Echo: 06/17/2016 LV EF: 55% -   60% Study Conclusions - Left ventricle: The cavity size was normal. Wall thickness was   increased in a pattern of severe LVH. Systolic function was   normal. The  estimated ejection fraction was in the range of 55%   to 60%. Although no diagnostic regional wall motion abnormality   was identified, this possibility cannot be completely excluded on   the basis of this study. Doppler parameters are consistent with   abnormal left ventricular relaxation (grade 1 diastolic   dysfunction). - Aortic valve: Trileaflet; moderately calcified leaflets. There   was no stenosis. - Aorta: Mildly dilated aortic root and ascending aorta. Aortic   root dimension: 40 mm (ED). Ascending aortic diameter: 38 mm (S). - Mitral valve: Mildly calcified annulus. - Right ventricle: The cavity size was mildly dilated. Systolic   function was normal. - Tricuspid valve: Peak RV-RA gradient (S): 28 mm Hg. - Systemic veins: IVC was not visualized. Impressions: - Technically difficult study with poor acoustic windows. Normal LV   size with severe LV hypertrophy. EF 55-60. Mildly dilated RV with   normal systolic function. No significant valvular abnormalities.  ECG:  NSR, incomplete LBBB, LAD, HR  73  Radiology:  Mr Brain Wo Contrast  Result Date: 06/16/2016 CLINICAL DATA:  83 y/o  M; generalized weakness and fall. EXAM: MRI HEAD WITHOUT CONTRAST TECHNIQUE: Multiplanar, multiecho pulse sequences of the brain and surrounding structures were obtained without intravenous contrast. COMPARISON:  06/15/2016 CT of the head. FINDINGS: Brain: No acute infarction, hemorrhage, hydrocephalus, extra-axial collection or mass lesion. Foci of T2 FLAIR hyperintense signal abnormality in subcortical and periventricular white matter are nonspecific are compatible with moderate chronic microvascular ischemic changes of the brain. Mild brain parenchymal volume loss for age. Small chronic lacunar infarct within the left caudate head and hemosiderin stained chronic lacunar infarct in right lentiform nucleus. Vascular: Normal flow voids. Skull and upper cervical spine: Normal marrow signal. Sinuses/Orbits:  Mild ethmoid and maxillary sinus mucosal thickening and large right maxillary sinus mucous retention cyst. No abnormal signal of the mastoid air cells. Right intra-ocular lens replacement. Other: None. IMPRESSION: 1. No acute intracranial abnormality. 2. Moderate chronic microvascular ischemic changes and mild parenchymal volume loss of the brain. Small chronic lacunar infarcts within right lentiform nucleus and left caudate head. 3. Mild paranasal sinus disease. Electronically Signed   By: Kristine Garbe M.D.   On: 06/16/2016 19:20   Mr Cervical Spine Wo Contrast  Result Date: 06/17/2016 CLINICAL DATA:  Progressive weakness.  Drooling. EXAM: MRI CERVICAL SPINE WITHOUT CONTRAST TECHNIQUE: Multiplanar, multisequence MR imaging of the cervical spine was performed. No intravenous contrast was administered. COMPARISON:  None. FINDINGS: Alignment: Exaggerated lordosis.  No listhesis. Vertebrae: No fracture, evidence of discitis, or bone lesion. Mild marrow edema about the left C4-5 facet, degenerative appearing. Cord: Normal signal and morphology. Posterior Fossa, vertebral arteries, paraspinal tissues: Bilateral thyroid nodules, up to 12 mm on the left, below size threshold for recommend sonographic follow-up. Disc levels: C2-3: Facet arthropathy with moderate spurring on the left. Ligamentum flavum thickening. Patent canal and foramina C3-4: Facet arthropathy with spurring greater on the left. Ligamentum flavum thickening. Mild endplate ridging. Patent canal and foramina C4-5: Facet arthropathy with moderate bilateral spurring. Ligamentum flavum thickening. No impingement C5-6: Facet arthropathy with mild spurring. Ligamentum flavum thickening.  Disc narrowing with posterior disc osteophyte complex. No impingement C6-7: Mild facet spurring.  Mild disc narrowing.  No impingement C7-T1:Mild facet arthropathy with possible ankylosis on the right. Mild disc narrowing. No impingement IMPRESSION: 1. No  explanation for weakness. The canal and foramina are diffusely patent. No evidence of myelopathy. 2. Facet arthropathy and mild for age disc degeneration. Mild degenerative marrow edema noted about the left C4-5 facet. Electronically Signed   By: Monte Fantasia M.D.   On: 06/17/2016 12:41    ASSESSMENT AND PLAN:    Principal Problem:   CAP (community acquired pneumonia) Active Problems:   Hyperlipidemia   GERD (gastroesophageal reflux disease)   BPH (benign prostatic hyperplasia)   PAF (paroxysmal atrial fibrillation) ([facility])   Pressure injury of skin   Lobar pneumonia ([place])   Acute congestive heart failure ([place])   Unresponsiveness   Community acquired pneumonia   Weakness  Jason [name] is a 83 y.o. male with a history of PAF on Eliquis, obesity, bladder cancer, non-Hodgkin's lymphoma, OA, and hyperlipidemia who presented to [facility] via EMS on 06/15/16 for evaluation of weakness. He had a syncopal episode yesterday and bradycardia noted on tele and cardiology consulted.   Episode of unresponsiveness: not clearly syncope. Unclear etiology, but I do not think a heart rate in the 40-50s or ventricular ectopy could cause this episode. 2D ECHO with normal LV function. Neurology following and work up so far negative. He has had multiple neurologic issues including progressive weakness, progressive difficulty swallowing, increased drooling, progressive decreased ability to keep his head in an upright position. Agree with outpatient neuro follow up.   PAF: he has what looks like sinus with PACs vs course afib. He is currently maintaining NSR. Not on any AV nodal blocking agents. Would avoid these given evidence of underlying conduction disease on ECG. Continue Eliquis for CHADSVASC score of at least 3 (HTN, age). Okay to follow up with PCP unless they would like to follow with cardiology.   Possibly aspiration PNA: per IM.   HLD: continue statin  Signed: Angelena Form, PA-C 06/17/2016 1:59  PM  Pager [phone]  Co-Sign MD

## 2016-06-17 NOTE — Progress Notes (Addendum)
PROGRESS NOTE    Jason [name]  GMW:[id number] DOB: 10-21-1927 DOA: 06/15/2016 PCP: Melinda Crutch, MD   Brief Narrative:  83 y.o. male with medical history significant for Paroxysmal AFib on eliquis, GERD, Barrett's esophagus, bladder CA, non-Hodgkin's lymphoma, OA, hyperlipidemia who presented to the ED with c/o generalized weakness and fall. Chest x-ray showed possible CHF with superimposed pneumonia in the middle left lung base Head CT showed no acute abnormalities, small vessel ischemic disease white matter changes, and ethmoidal paranasal sinus disease.  Assessment & Plan:   # Community acquired pneumonia: Continue ceftriaxone and azithromycin and bronchodilators. Continue to monitor for clinical progress. Patient is not hypoxic. Follow up culture results.  #Paroxysmal atrial fibrillation:continue Eliquis. Monitor in telemetry. Concern for bradyarrthmia yesterday during hyporesponsiveness therefore cardiology consult obtained. Continue to monitor.   #Brief episodes of unresponsiveness on 3/15: Exact etiology unknown. As per patient's daughter, patient was suddenly blanking out and became unresponsive. There was some movement of his upper body but the daughter was not able to specifically explained.  -Neuro consult appreciated, started on mestinon for ? Myasthenia Gravis, EEG with no seizure. Further evaluation and management as per neurologist. It was also concerning that if unresponsiveness caused by arrhythmia specially bradycardia. -Cardiology consult requested. -Follow up echocardiogram. -Frequent neuro check and seizure precautions.  #Hyperlipidemia: Continue statin  #History of GERD: Continue PPI  # History of BPH: Continue Flomax and monitor.  #Fall, generalized weakness and physical deconditioning: PT, OT and social worker evaluation. Patient likely needs rehabilitation or SNF on discharge. Swallow evaluation ongoing. Patient is on dysphagia level III diet. Continue to monitor  closely.  Principal Problem:   CAP (community acquired pneumonia) Active Problems:   Hyperlipidemia   GERD (gastroesophageal reflux disease)   BPH (benign prostatic hyperplasia)   PAF (paroxysmal atrial fibrillation) (HCC)   Pressure injury of skin   Lobar pneumonia ([place])    DVT prophylaxis:Systemic anticoagulation  Code Status:Full code  Family Communication: No family present at bedside Disposition Plan:Likely discharge to rehabilitation/SNF in 1-2 days    Consultants:   Neurologist  Procedures:None  Antimicrobials:Azithromycin and ceftriaxone since 3/14  Subjective: Patient was seen and examined at bedtime. He was more alert awake and oriented. Denied headache, dizziness, nausea, vomiting, chest pain or shortness of breath. Objective: Vitals:   06/17/16 0441 06/17/16 0500 06/17/16 0815 06/17/16 1025  BP: (!) 158/75  (!) 152/61 (!) 98/49  Pulse: 67  65 68  Resp: (!) 21  16 (!) 25  Temp: 97.8 F (36.6 C)  97.8 F (36.6 C)   TempSrc: Axillary  Oral   SpO2: 95%  96% 93%  Weight:  105.6 kg (232 lb 12.9 oz)    Height:        Intake/Output Summary (Last 24 hours) at 06/17/16 1040 Last data filed at 06/17/16 0900  Gross per 24 hour  Intake              446 ml  Output              875 ml  Net             -429 ml   Filed Weights   06/16/16 0537 06/16/16 1500 06/17/16 0500  Weight: 105.9 kg (233 lb 7.5 oz) 106.8 kg (235 lb 7.2 oz) 105.6 kg (232 lb 12.9 oz)    Examination:  General exam: More alert awake, not in distress Respiratory system: Clear bilateral. Respiratory effort normal. No wheezing or crackle Cardiovascular system: Regular rate rhythm,  S1-S2 normal.  No pedal edema. Gastrointestinal system: Abdomen is nondistended, soft and nontender. Normal bowel sounds heard. Central nervous system: Alert and oriented. No focal neurological deficits. Extremities: Symmetric 5 x 5 power. Skin: No rashes, lesions or ulcers Psychiatry: Judgement and insight appear  normal. Mood & affect appropriate.     Data Reviewed: I have personally reviewed following labs and imaging studies  CBC:  Recent Labs Lab 06/15/16 0726 06/16/16 1245  WBC 8.6 10.5  NEUTROABS 5.4  --   HGB 12.0* 12.8*  HCT 37.4* 39.7  MCV 85.0 84.3  PLT 173 086   Basic Metabolic Panel:  Recent Labs Lab 06/15/16 0726 06/15/16 1052 06/16/16 1245 06/16/16 1549  NA 138  --  135  --   K 3.8  --  3.8  --   CL 105  --  104  --   CO2 22  --  21*  --   GLUCOSE 102*  --  121*  --   BUN 20  --  19  --   CREATININE 1.06  --  1.12  --   CALCIUM 9.0  --  8.7*  --   MG  --  2.1 1.9 2.1   GFR: Estimated Creatinine Clearance: 57.6 mL/min (by C-G formula based on SCr of 1.12 mg/dL). Liver Function Tests:  Recent Labs Lab 06/15/16 0726  AST 19  ALT 11*  ALKPHOS 31*  BILITOT 0.7  PROT 6.6  ALBUMIN 3.3*   No results for input(s): LIPASE, AMYLASE in the last 168 hours.  Recent Labs Lab 06/16/16 1549  AMMONIA 27   Coagulation Profile: No results for input(s): INR, PROTIME in the last 168 hours. Cardiac Enzymes:  Recent Labs Lab 06/15/16 0726 06/16/16 1245 06/16/16 2032  CKTOTAL  --   --  125  TROPONINI 0.04* 0.04*  --    BNP (last 3 results) No results for input(s): PROBNP in the last 8760 hours. HbA1C: No results for input(s): HGBA1C in the last 72 hours. CBG:  Recent Labs Lab 06/16/16 1230 06/16/16 1639 06/16/16 2050 06/17/16 0813  GLUCAP 123* 127* 109* 97   Lipid Profile: No results for input(s): CHOL, HDL, LDLCALC, TRIG, CHOLHDL, LDLDIRECT in the last 72 hours. Thyroid Function Tests:  Recent Labs  06/16/16 2032  TSH 1.519   Anemia Panel: No results for input(s): VITAMINB12, FOLATE, FERRITIN, TIBC, IRON, RETICCTPCT in the last 72 hours. Sepsis Labs:  Recent Labs Lab 06/15/16 1052 06/17/16 0508  PROCALCITON <0.10 <0.10    Recent Results (from the past 240 hour(s))  Blood culture (routine x 2)     Status: None (Preliminary result)    Collection Time: 06/15/16  9:20 AM  Result Value Ref Range Status   Specimen Description BLOOD RIGHT ANTECUBITAL  Final   Special Requests BOTTLES DRAWN AEROBIC ONLY 10CC  Final   Culture NO GROWTH 1 DAY  Final   Report Status PENDING  Incomplete  Blood culture (routine x 2)     Status: None (Preliminary result)   Collection Time: 06/15/16  9:30 AM  Result Value Ref Range Status   Specimen Description BLOOD RIGHT HAND  Final   Special Requests BOTTLES DRAWN AEROBIC ONLY 10CC  Final   Culture NO GROWTH 1 DAY  Final   Report Status PENDING  Incomplete  Culture, sputum-assessment     Status: None   Collection Time: 06/16/16  8:58 AM  Result Value Ref Range Status   Specimen Description SPUTUM  Final   Special Requests NONE  Final   Sputum evaluation   Final    Sputum specimen not acceptable for testing.  Please recollect.   Gram Stain Report Called to,Read Back By and Verified With: RN Micheline Rough [phone] [id number] MLM    Report Status 06/16/2016 FINAL  Final  MRSA PCR Screening     Status: None   Collection Time: 06/16/16  2:11 PM  Result Value Ref Range Status   MRSA by PCR NEGATIVE NEGATIVE Final    Comment:        The GeneXpert MRSA Assay (FDA approved for NASAL specimens only), is one component of a comprehensive MRSA colonization surveillance program. It is not intended to diagnose MRSA infection nor to guide or monitor treatment for MRSA infections.          Radiology Studies: Mr Brain 41 Contrast  Result Date: 06/16/2016 CLINICAL DATA:  83 y/o  M; generalized weakness and fall. EXAM: MRI HEAD WITHOUT CONTRAST TECHNIQUE: Multiplanar, multiecho pulse sequences of the brain and surrounding structures were obtained without intravenous contrast. COMPARISON:  06/15/2016 CT of the head. FINDINGS: Brain: No acute infarction, hemorrhage, hydrocephalus, extra-axial collection or mass lesion. Foci of T2 FLAIR hyperintense signal abnormality in subcortical and periventricular white  matter are nonspecific are compatible with moderate chronic microvascular ischemic changes of the brain. Mild brain parenchymal volume loss for age. Small chronic lacunar infarct within the left caudate head and hemosiderin stained chronic lacunar infarct in right lentiform nucleus. Vascular: Normal flow voids. Skull and upper cervical spine: Normal marrow signal. Sinuses/Orbits: Mild ethmoid and maxillary sinus mucosal thickening and large right maxillary sinus mucous retention cyst. No abnormal signal of the mastoid air cells. Right intra-ocular lens replacement. Other: None. IMPRESSION: 1. No acute intracranial abnormality. 2. Moderate chronic microvascular ischemic changes and mild parenchymal volume loss of the brain. Small chronic lacunar infarcts within right lentiform nucleus and left caudate head. 3. Mild paranasal sinus disease. Electronically Signed   By: Kristine Garbe M.D.   On: 06/16/2016 19:20        Scheduled Meds: . apixaban  5 mg Oral BID  . azithromycin  500 mg Intravenous Q24H  . cefTRIAXone (ROCEPHIN)  IV  1 g Intravenous Q24H  . docusate sodium  100 mg Oral BID  . omega-3 acid ethyl esters  1 g Oral BID  . pantoprazole  80 mg Oral Q1200  . pyridostigmine  30 mg Oral Q8H  . senna  1 tablet Oral BID  . simvastatin  40 mg Oral QHS  . sodium chloride flush  3 mL Intravenous Q12H  . tamsulosin  0.4 mg Oral QHS   Continuous Infusions:    LOS: 1 day    Dron Tanna Furry, MD [facility] Pager [phone]  If 7PM-7AM, please contact night-coverage [email or web address] Password [email or web address] 06/17/2016, 10:40 AM

## 2016-06-17 NOTE — Plan of Care (Signed)
Problem: Respiratory: Goal: Respiratory status will improve Outcome: Adequate for Discharge Patient using room air with oxygen saturation at 97%

## 2016-06-18 LAB — BASIC METABOLIC PANEL
Anion gap: 9 (ref 5–15)
BUN: 28 mg/dL — ABNORMAL HIGH (ref 6–20)
CHLORIDE: 105 mmol/L (ref 101–111)
CO2: 22 mmol/L (ref 22–32)
Calcium: 8.8 mg/dL — ABNORMAL LOW (ref 8.9–10.3)
Creatinine, Ser: 1.17 mg/dL (ref 0.61–1.24)
GFR calc non Af Amer: 55 mL/min — ABNORMAL LOW (ref >60)
Glucose, Bld: 106 mg/dL — ABNORMAL HIGH (ref 65–99)
POTASSIUM: 3.5 mmol/L (ref 3.5–5.1)
SODIUM: 136 mmol/L (ref 135–145)

## 2016-06-18 LAB — CBC
HCT: 37.5 % — ABNORMAL LOW (ref 39.0–52.0)
HEMOGLOBIN: 12 g/dL — AB (ref 13.0–17.0)
MCH: 27.1 pg (ref 26.0–34.0)
MCHC: 32 g/dL (ref 30.0–36.0)
MCV: 84.7 fL (ref 78.0–100.0)
Platelets: 167 10*3/uL (ref 150–400)
RBC: 4.43 MIL/uL (ref 4.22–5.81)
RDW: 14.2 % (ref 11.5–15.5)
WBC: 11.6 10*3/uL — ABNORMAL HIGH (ref 4.0–10.5)

## 2016-06-18 LAB — GLUCOSE, CAPILLARY
GLUCOSE-CAPILLARY: 108 mg/dL — AB (ref 65–99)
GLUCOSE-CAPILLARY: 115 mg/dL — AB (ref 65–99)
GLUCOSE-CAPILLARY: 134 mg/dL — AB (ref 65–99)
Glucose-Capillary: 118 mg/dL — ABNORMAL HIGH (ref 65–99)

## 2016-06-18 MED ORDER — AZITHROMYCIN 500 MG PO TABS
500.0000 mg | ORAL_TABLET | Freq: Every day | ORAL | Status: DC
  Administered 2016-06-18 – 2016-06-20 (×3): 500 mg via ORAL
  Filled 2016-06-18 (×4): qty 1

## 2016-06-18 NOTE — Progress Notes (Addendum)
I briefly saw pt today. He is unchanged.  He needs to go to a neuromuscular clinic, perhaps at [place] or [place].  I discussed this with him at length.  He can be discharged from my standpoint.  I will be signing off at this time.  Please follow the MG panel as outpt as this tes will take some time to be resulted.

## 2016-06-18 NOTE — Progress Notes (Signed)
PROGRESS NOTE    Jason [name]  ZOX:[id number] DOB: [birth year] DOA: 06/15/2016 PCP: Melinda Crutch, MD   Brief Narrative:  83 y.o. male with medical history significant for Paroxysmal AFib on eliquis, GERD, Barrett's esophagus, bladder CA, non-Hodgkin's lymphoma, OA, hyperlipidemia who presented to the ED with c/o generalized weakness and fall. Chest x-ray showed possible CHF with superimposed pneumonia in the middle left lung base Head CT showed no acute abnormalities, small vessel ischemic disease white matter changes, and ethmoidal paranasal sinus disease.  Assessment & Plan:   # Community acquired pneumonia: Patient is clinically improving. Continue ceftriaxone and azithromycin and bronchodilators. Patient is not hypoxic. Follow up culture results.  #Paroxysmal atrial fibrillation:continue Eliquis. Monitor in telemetry. Evaluated by cardiologist regarding possible bradycardia during the episode of unresponsiveness. Cardiologist did not think that the syncope is caused by cardiac etiology. Continue to monitor for now.  #Brief episodes of unresponsiveness on 3/15: Exact etiology unknown. As per patient's daughter, patient was suddenly blanking out and became unresponsive. There was some movement of his upper body but the daughter was not able to specifically explained.  -Neuro consult appreciated, received dose mestinon for ? Myasthenia Gravis (ruled out), however with no clinical improvement, it was discontinued. EEG with no seizure.  -As per neurologist,patient will need an EMG/NCS at an outpatient neurology office and this should be set up for him before discharge. Patient is to follow-up with neurologist on discharge. -echocardiogram EF of 55-60% and LV hypertrophy. -Frequent neuro check and seizure precautions.  #Hyperlipidemia: Continue statin  #History of GERD: Continue PPI  # History of BPH: Continue Flomax and monitor.  #Fall, generalized weakness and physical deconditioning: PT,  OT and social worker evaluation. Patient likely needs rehabilitation or SNF on discharge. Swallow evaluation ongoing. Patient is on dysphagia level III diet. Continue to monitor closely. I consulted social worker for management of rehabilitation on discharge. I discussed with the patient's daughter at bedside at length. Patient lives by himself at home which is probably not safe at this time.  Principal Problem:   CAP (community acquired pneumonia) Active Problems:   Hyperlipidemia   GERD (gastroesophageal reflux disease)   BPH (benign prostatic hyperplasia)   PAF (paroxysmal atrial fibrillation) (HCC)   Pressure injury of skin, Unspecified.   Lobar pneumonia ([place])    DVT prophylaxis:Systemic anticoagulation  Code Status:Full code  Family Communication: Discussed with the patient's daughter at bedside Disposition Plan:Likely discharge to rehabilitation/SNF in 1-2 days    Consultants:   Neurologist  Cardiologist  Procedures:None  Antimicrobials:Azithromycin and ceftriaxone since 3/14  Subjective: Patient was seen and examined at bedtime. He was alert awake and oriented. Clinically improved. Denied headache, dizziness, nausea vomiting chest pain or shortness of breath. Objective: Vitals:   06/18/16 0600 06/18/16 0721 06/18/16 1000 06/18/16 1248  BP: (!) 140/56 123/65 (!) 114/53 (!) 111/51  Pulse: 64 63 67 62  Resp: 15 18 (!) 22 14  Temp:  97.8 F (36.6 C)  98.3 F (36.8 C)  TempSrc:  Oral  Oral  SpO2: 94% 96% 92% 98%  Weight:      Height:        Intake/Output Summary (Last 24 hours) at 06/18/16 1418 Last data filed at 06/18/16 1017  Gross per 24 hour  Intake              283 ml  Output              800 ml  Net             -  517 ml   Filed Weights   06/16/16 1500 06/17/16 0500 06/18/16 0413  Weight: 106.8 kg (235 lb 7.2 oz) 105.6 kg (232 lb 12.9 oz) 107 kg (235 lb 14.3 oz)    Examination:  General exam: Alert awake and pleasant, not in distress. Respiratory  system: Clear bilateral. Respiratory effort normal. No wheezing or crackle Cardiovascular system: Regular rate rhythm, S1-S2 normal.  No pedal edema. Gastrointestinal system: Abdomen is nondistended, soft and nontender. Normal bowel sounds heard. Central nervous system: Alert and oriented. No focal neurological deficits. Extremities: Symmetric 5 x 5 power. Skin: No rashes, lesions or ulcers Psychiatry: Judgement and insight appear normal. Mood & affect appropriate.     Data Reviewed: I have personally reviewed following labs and imaging studies  CBC:  Recent Labs Lab 06/15/16 0726 06/16/16 1245 06/18/16 0346  WBC 8.6 10.5 11.6*  NEUTROABS 5.4  --   --   HGB 12.0* 12.8* 12.0*  HCT 37.4* 39.7 37.5*  MCV 85.0 84.3 84.7  PLT 173 182 563   Basic Metabolic Panel:  Recent Labs Lab 06/15/16 0726 06/15/16 1052 06/16/16 1245 06/16/16 1549 06/18/16 0346  NA 138  --  135  --  136  K 3.8  --  3.8  --  3.5  CL 105  --  104  --  105  CO2 22  --  21*  --  22  GLUCOSE 102*  --  121*  --  106*  BUN 20  --  19  --  28*  CREATININE 1.06  --  1.12  --  1.17  CALCIUM 9.0  --  8.7*  --  8.8*  MG  --  2.1 1.9 2.1  --    GFR: Estimated Creatinine Clearance: 55.5 mL/min (by C-G formula based on SCr of 1.17 mg/dL). Liver Function Tests:  Recent Labs Lab 06/15/16 0726  AST 19  ALT 11*  ALKPHOS 31*  BILITOT 0.7  PROT 6.6  ALBUMIN 3.3*   No results for input(s): LIPASE, AMYLASE in the last 168 hours.  Recent Labs Lab 06/16/16 1549  AMMONIA 27   Coagulation Profile: No results for input(s): INR, PROTIME in the last 168 hours. Cardiac Enzymes:  Recent Labs Lab 06/15/16 0726 06/16/16 1245 06/16/16 2032  CKTOTAL  --   --  125  TROPONINI 0.04* 0.04*  --    BNP (last 3 results) No results for input(s): PROBNP in the last 8760 hours. HbA1C: No results for input(s): HGBA1C in the last 72 hours. CBG:  Recent Labs Lab 06/17/16 1307 06/17/16 1618 06/17/16 2118  06/18/16 0827 06/18/16 1249  GLUCAP 133* 198* 103* 108* 115*   Lipid Profile: No results for input(s): CHOL, HDL, LDLCALC, TRIG, CHOLHDL, LDLDIRECT in the last 72 hours. Thyroid Function Tests:  Recent Labs  06/16/16 2032  TSH 1.519   Anemia Panel: No results for input(s): VITAMINB12, FOLATE, FERRITIN, TIBC, IRON, RETICCTPCT in the last 72 hours. Sepsis Labs:  Recent Labs Lab 06/15/16 1052 06/17/16 0508  PROCALCITON <0.10 <0.10    Recent Results (from the past 240 hour(s))  Blood culture (routine x 2)     Status: None (Preliminary result)   Collection Time: 06/15/16  9:20 AM  Result Value Ref Range Status   Specimen Description BLOOD RIGHT ANTECUBITAL  Final   Special Requests BOTTLES DRAWN AEROBIC ONLY 10CC  Final   Culture NO GROWTH 3 DAYS  Final   Report Status PENDING  Incomplete  Blood culture (routine x 2)  Status: None (Preliminary result)   Collection Time: 06/15/16  9:30 AM  Result Value Ref Range Status   Specimen Description BLOOD RIGHT HAND  Final   Special Requests BOTTLES DRAWN AEROBIC ONLY 10CC  Final   Culture NO GROWTH 3 DAYS  Final   Report Status PENDING  Incomplete  Culture, sputum-assessment     Status: None   Collection Time: 06/16/16  8:58 AM  Result Value Ref Range Status   Specimen Description SPUTUM  Final   Special Requests NONE  Final   Sputum evaluation   Final    Sputum specimen not acceptable for testing.  Please recollect.   Gram Stain Report Called to,Read Back By and Verified With: RN Micheline Rough [phone] 1153AM MLM    Report Status 06/16/2016 FINAL  Final  MRSA PCR Screening     Status: None   Collection Time: 06/16/16  2:11 PM  Result Value Ref Range Status   MRSA by PCR NEGATIVE NEGATIVE Final    Comment:        The GeneXpert MRSA Assay (FDA approved for NASAL specimens only), is one component of a comprehensive MRSA colonization surveillance program. It is not intended to diagnose MRSA infection nor to guide  or monitor treatment for MRSA infections.          Radiology Studies: Mr Brain 37 Contrast  Result Date: 06/16/2016 CLINICAL DATA:  83 y/o  M; generalized weakness and fall. EXAM: MRI HEAD WITHOUT CONTRAST TECHNIQUE: Multiplanar, multiecho pulse sequences of the brain and surrounding structures were obtained without intravenous contrast. COMPARISON:  06/15/2016 CT of the head. FINDINGS: Brain: No acute infarction, hemorrhage, hydrocephalus, extra-axial collection or mass lesion. Foci of T2 FLAIR hyperintense signal abnormality in subcortical and periventricular white matter are nonspecific are compatible with moderate chronic microvascular ischemic changes of the brain. Mild brain parenchymal volume loss for age. Small chronic lacunar infarct within the left caudate head and hemosiderin stained chronic lacunar infarct in right lentiform nucleus. Vascular: Normal flow voids. Skull and upper cervical spine: Normal marrow signal. Sinuses/Orbits: Mild ethmoid and maxillary sinus mucosal thickening and large right maxillary sinus mucous retention cyst. No abnormal signal of the mastoid air cells. Right intra-ocular lens replacement. Other: None. IMPRESSION: 1. No acute intracranial abnormality. 2. Moderate chronic microvascular ischemic changes and mild parenchymal volume loss of the brain. Small chronic lacunar infarcts within right lentiform nucleus and left caudate head. 3. Mild paranasal sinus disease. Electronically Signed   By: Kristine Garbe M.D.   On: 06/16/2016 19:20   Mr Cervical Spine Wo Contrast  Result Date: 06/17/2016 CLINICAL DATA:  Progressive weakness.  Drooling. EXAM: MRI CERVICAL SPINE WITHOUT CONTRAST TECHNIQUE: Multiplanar, multisequence MR imaging of the cervical spine was performed. No intravenous contrast was administered. COMPARISON:  None. FINDINGS: Alignment: Exaggerated lordosis.  No listhesis. Vertebrae: No fracture, evidence of discitis, or bone lesion. Mild marrow  edema about the left C4-5 facet, degenerative appearing. Cord: Normal signal and morphology. Posterior Fossa, vertebral arteries, paraspinal tissues: Bilateral thyroid nodules, up to 12 mm on the left, below size threshold for recommend sonographic follow-up. Disc levels: C2-3: Facet arthropathy with moderate spurring on the left. Ligamentum flavum thickening. Patent canal and foramina C3-4: Facet arthropathy with spurring greater on the left. Ligamentum flavum thickening. Mild endplate ridging. Patent canal and foramina C4-5: Facet arthropathy with moderate bilateral spurring. Ligamentum flavum thickening. No impingement C5-6: Facet arthropathy with mild spurring. Ligamentum flavum thickening. Disc narrowing with posterior disc osteophyte complex. No impingement C6-7: Mild  facet spurring.  Mild disc narrowing.  No impingement C7-T1:Mild facet arthropathy with possible ankylosis on the right. Mild disc narrowing. No impingement IMPRESSION: 1. No explanation for weakness. The canal and foramina are diffusely patent. No evidence of myelopathy. 2. Facet arthropathy and mild for age disc degeneration. Mild degenerative marrow edema noted about the left C4-5 facet. Electronically Signed   By: Monte Fantasia M.D.   On: 06/17/2016 12:41   Dg Swallowing Func-speech Pathology  Result Date: 06/17/2016 Objective Swallowing Evaluation: Type of Study: MBS-Modified Barium Swallow Study Patient Details Name: Jason [name] MRN: [id number] Date of Birth: Sep 12, 1927 Today's Date: 06/17/2016 Time: SLP Start Time (ACUTE ONLY): 1230-SLP Stop Time (ACUTE ONLY): 1300 SLP Time Calculation (min) (ACUTE ONLY): 30 min Past Medical History: Past Medical History: Diagnosis Date . Arthritis  . Barrett esophagus  . Bladder cancer ([place]) dx'd 8921,1941  surg only . Colon polyps   adenomatous . Difficulty sleeping   OCCASIONALLY . Diverticulosis of colon (without mention of hemorrhage)  . Frequency of urination  . GERD (gastroesophageal reflux  disease)  . Hiatal hernia  . History of bladder cancer 1981  EXCISION ONLY . History of skin cancer   BASAL CELL . Hyperlipidemia  . Lymphoma, non-Hodgkin's ([place]) 2011  [place]  . Macular degeneration   LEFT EYE . Multiple nodules of lung 09/16/2011 . PAF (paroxysmal atrial fibrillation) ([place])  . Personal history of colonic polyps 07/15/2010  tubular adenoma . Prostate cancer [facility])   patient denies . PUD (peptic ulcer disease)  . Stomach cancer ([place])   NHL origin Past Surgical History: Past Surgical History: Procedure Laterality Date . APPENDECTOMY   . bone spur    left shoulder . CHOLECYSTECTOMY   . KNEE ARTHROPLASTY Left 07/30/2015  Procedure: LEFT TOTAL KNEE ARTHROPLASTY WITH COMPUTER NAVIGATION;  Surgeon: Rod Can, MD;  Location: [facility];  Service: Orthopedics;  Laterality: Left; . LASER OF PROSTATE W/ GREEN LIGHT PVP   . TENDON REPAIR    right arm . TRIGGER FINGER RELEASE    bilateral hands . WRIST SURGERY    Bilateral HPI: Roc Streett Wilsonis a 83 y.o.malewith medical history significant for Paroxysmal AFib, GERD, hiatal hernia, Barrett's esophagus, multiple lung nodules, bladder CA, non-Hodgkin's lymphoma, OA, hyperlipidemia who presented to the ED with c/o generalized weakness and fall. Chest x-ray showed possible CHF with superimposed pneumonia in the middle left lung base. Head CT showed no acute abnormalities, small vessel ischemic disease white matter changes,and ethmoidal paranasal sinus disease. Barium esophagram poor esophageal motility. No stricture or mass. Barium tablet passed readily into the stomach. Negative for reflux. No Data Recorded Assessment / Plan / Recommendation CHL IP CLINICAL IMPRESSIONS 06/17/2016 Clinical Impression  Pt demonstrates a moderate oropharyngeal dysphagia with neuromuscular deficits impacting sensory and motor function. Pt observed to have bilateral ptosis, anterior spillage of secretions on the left (family reports history of left facial droop due to Bell's  palsy) and weak cough strength upon arrival to exam. Despite deficits pt apepars conginitively intact and is able to follow complex commands.  Oral function chaacteraized by mild lingual weakness observed via mild lingual residuals, with functional abiltiy to masticate and transit soft solids (though endurance not taxed). Flash penetration and silent aspriation during the swallow occured with boluses larger than 76ml (estimate) due to slow laryngeal closure and epiglottic deflection. Pt was able trigger a timely swallow and eventually achieve full laryngeal closure which expelled penetrate when possible, but again sluggish movement kept vestible partialy open during  bolus passage. Further silent aspiration events also occured after the swallow if pt was cued to cough, which spilled valleuular residual into the airway. Attempted chin tuck without improvement and mendelsohn with visual biofeedback (pt unable after 5 attempts with max cues). Best methods included verbal cues for small sips (bolus size consistently comparable in appearance and fucntion with 44mL teaspoon), oral hold and effortful swallow x2. Given increased residuals with thicker textures, recommend pt initiate a dys 2/thin liquid diet. Recommend water only with oral care at least 3x day with toothbrush and paste. Pt will benefit from exercises to target hyolaryngeal excursion and base of tongue strength as well as EMST. Will f/u for education with family.  SLP Visit Diagnosis Dysphagia, oropharyngeal phase (R13.12) Attention and concentration deficit following -- Frontal lobe and executive function deficit following -- Impact on safety and function --   CHL IP TREATMENT RECOMMENDATION 06/17/2016 Treatment Recommendations Therapy as outlined in treatment plan below   Prognosis 06/17/2016 Prognosis for Safe Diet Advancement Good Barriers to Reach Goals -- Barriers/Prognosis Comment -- CHL IP DIET RECOMMENDATION 06/17/2016 SLP Diet Recommendations Dysphagia 2  (Fine chop) solids;Thin liquid Liquid Administration via Cup Medication Administration Whole meds with puree Compensations Slow rate;Small sips/bites;Effortful swallow;Minimize environmental distractions;Clear throat intermittently Postural Changes Remain semi-upright after after feeds/meals (Comment)   CHL IP OTHER RECOMMENDATIONS 06/17/2016 Recommended Consults -- Oral Care Recommendations Oral care before and after PO Other Recommendations --   CHL IP FOLLOW UP RECOMMENDATIONS 06/17/2016 Follow up Recommendations 24 hour supervision/assistance   CHL IP FREQUENCY AND DURATION 06/17/2016 Speech Therapy Frequency (ACUTE ONLY) min 3x week Treatment Duration 2 weeks      CHL IP ORAL PHASE 06/17/2016 Oral Phase Impaired Oral - Pudding Teaspoon -- Oral - Pudding Cup -- Oral - Honey Teaspoon -- Oral - Honey Cup -- Oral - Nectar Teaspoon -- Oral - Nectar Cup -- Oral - Nectar Straw -- Oral - Thin Teaspoon -- Oral - Thin Cup Left anterior bolus loss;Lingual/palatal residue Oral - Thin Straw -- Oral - Puree WFL Oral - Mech Soft WFL Oral - Regular -- Oral - Multi-Consistency -- Oral - Pill -- Oral Phase - Comment --  CHL IP PHARYNGEAL PHASE 06/17/2016 Pharyngeal Phase Impaired Pharyngeal- Pudding Teaspoon -- Pharyngeal -- Pharyngeal- Pudding Cup -- Pharyngeal -- Pharyngeal- Honey Teaspoon -- Pharyngeal -- Pharyngeal- Honey Cup -- Pharyngeal -- Pharyngeal- Nectar Teaspoon -- Pharyngeal -- Pharyngeal- Nectar Cup -- Pharyngeal -- Pharyngeal- Nectar Straw -- Pharyngeal -- Pharyngeal- Thin Teaspoon -- Pharyngeal -- Pharyngeal- Thin Cup Reduced anterior laryngeal mobility;Penetration/Aspiration during swallow;Reduced tongue base retraction;Penetration/Apiration after swallow;Moderate aspiration;Trace aspiration;Pharyngeal residue - valleculae;Compensatory strategies attempted (with notebox) Pharyngeal Material enters airway, passes BELOW cords without attempt by patient to eject out (silent aspiration);Material enters airway, CONTACTS  cords and then ejected out;Material does not enter airway Pharyngeal- Thin Straw -- Pharyngeal -- Pharyngeal- Puree Reduced anterior laryngeal mobility;Reduced tongue base retraction;Pharyngeal residue - valleculae;Compensatory strategies attempted (with notebox) Pharyngeal -- Pharyngeal- Mechanical Soft Reduced anterior laryngeal mobility;Reduced tongue base retraction;Pharyngeal residue - valleculae;Compensatory strategies attempted (with notebox) Pharyngeal -- Pharyngeal- Regular -- Pharyngeal -- Pharyngeal- Multi-consistency -- Pharyngeal -- Pharyngeal- Pill -- Pharyngeal -- Pharyngeal Comment --  No flowsheet data found. CHL IP GO 06/16/2016 Functional Assessment Tool Used skilled clinical judgement Functional Limitations Swallowing Swallow Current Status [phone]) CK Swallow Goal Status (B7628) [place] Swallow Discharge Status [phone]) (None) Motor Speech Current Status [phone]) (None) Motor Speech Goal Status (P7106) (None) Motor Speech Goal Status (Y6948) (None) Spoken Language Comprehension Current Status [phone]) (  None) Spoken Language Comprehension Goal Status [phone]) (None) Spoken Language Comprehension Discharge Status [phone]) (None) Spoken Language Expression Current Status [phone]) (None) Spoken Language Expression Goal Status [phone]) (None) Spoken Language Expression Discharge Status [phone]) (None) Attention Current Status (G8185) (None) Attention Goal Status (U3149) (None) Attention Discharge Status [phone]) (None) Memory Current Status (Z8588) (None) Memory Goal Status (F0277) (None) Memory Discharge Status (A1287) (None) Voice Current Status (O6767) (None) Voice Goal Status (M0947) (None) Voice Discharge Status [phone]) (None) Other Speech-Language Pathology Functional Limitation Current Status [phone]) (None) Other Speech-Language Pathology Functional Limitation Goal Status (U7654) (None) Other Speech-Language Pathology Functional Limitation Discharge Status [phone]) (None) [place], Katherene Ponto 06/17/2016,  2:21 PM                   Scheduled Meds: . apixaban  5 mg Oral BID  . azithromycin  500 mg Oral Daily  . cefTRIAXone (ROCEPHIN)  IV  1 g Intravenous Q24H  . docusate sodium  100 mg Oral BID  . omega-3 acid ethyl esters  1 g Oral BID  . pantoprazole  80 mg Oral Q1200  . senna  1 tablet Oral BID  . simvastatin  40 mg Oral QHS  . sodium chloride flush  3 mL Intravenous Q12H  . tamsulosin  0.4 mg Oral QHS   Continuous Infusions:    LOS: 2 days    Schyler Butikofer Tanna Furry, MD [facility] Pager [phone]  If 7PM-7AM, please contact night-coverage [email or web address] Password [email or web address] 06/18/2016, 2:18 PM

## 2016-06-18 NOTE — Progress Notes (Signed)
Physical Therapy Treatment Patient Details Name: Jason [name] MRN: [id number] DOB: January 14, 1928 Today's Date: 06/18/2016    History of Present Illness Jason [name] is a 83 y.o. male with medical history significant for Paroxysmal AFib on eliquis, GERD, Barrett's esophagus, bladder CA, non-Hodgkin's lymphoma, OA, hyperlipidemia who presented to the ED with c/o generalized weakness and fall.    PT Comments    Pt making good progress with mobility. BP was assessed with positional changes and remained stable throughout. All other VSS throughout as well. Pt very motivated to work with therapist, performing bilateral LE strengthening exercises and ambulating 9' with RW and min guard with VC'ing for safety with RW. Pt would continue to benefit from skilled physical therapy services at this time while admitted and after d/c to address his limitations in order to improve his overall safety and independence with functional mobility.     Follow Up Recommendations  CIR     Equipment Recommendations  None recommended by PT    Recommendations for Other Services Rehab consult     Precautions / Restrictions Precautions Precautions: Fall Restrictions Weight Bearing Restrictions: No    Mobility  Bed Mobility Overal bed mobility: Needs Assistance Bed Mobility: Supine to Sit     Supine to sit: HOB elevated;Mod assist     General bed mobility comments: pt transferred to sitting on L side of bed with use of bed rails and mod A at trunk with use of bed pads to achieve sitting EOB  Transfers Overall transfer level: Needs assistance Equipment used: Rolling walker (2 wheeled) Transfers: Sit to/from Stand Sit to Stand: Min assist         General transfer comment: increased time, VC'ing for bilateral hand placement and min A to rise from bed  Ambulation/Gait Ambulation/Gait assistance: Min guard Ambulation Distance (Feet): 75 Feet Assistive device: Rolling walker (2 wheeled) Gait  Pattern/deviations: Step-through pattern;Decreased stride length;Trunk flexed Gait velocity: decreased   General Gait Details: pt required occasional VC'ing to maintain a safe distance from RW, mild instability with no LOB or need for physical assistance, min guard for safety   Stairs            Wheelchair Mobility    Modified Rankin (Stroke Patients Only)       Balance Overall balance assessment: Needs assistance Sitting-balance support: Feet supported;No upper extremity supported Sitting balance-Leahy Scale: Fair     Standing balance support: Bilateral upper extremity supported Standing balance-Leahy Scale: Poor Standing balance comment: pt reliant on bilateral UEs on RW                    Cognition Arousal/Alertness: Awake/alert Behavior During Therapy: WFL for tasks assessed/performed Overall Cognitive Status: Within Functional Limits for tasks assessed                      Exercises General Exercises - Lower Extremity Ankle Circles/Pumps: AROM;Both;20 reps;Seated Long Arc Quad: AROM;Strengthening;Both;10 reps;Seated Hip Flexion/Marching: AROM;Strengthening;Both;10 reps;Seated    General Comments        Pertinent Vitals/Pain Pain Assessment: No/denies pain    Home Living                      Prior Function            PT Goals (current goals can now be found in the care plan section) Acute Rehab PT Goals PT Goal Formulation: With patient Time For Goal Achievement: 06/30/16 Potential to Achieve Goals:  Good Progress towards PT goals: Progressing toward goals    Frequency    Min 3X/week      PT Plan Discharge plan needs to be updated    Co-evaluation             End of Session Equipment Utilized During Treatment: Gait belt Activity Tolerance: Patient tolerated treatment well Patient left: in chair;with call bell/phone within reach;with family/visitor present Nurse Communication: Mobility status PT Visit  Diagnosis: Unsteadiness on feet (R26.81);Other abnormalities of gait and mobility (R26.89)     Time: 7588-3254 PT Time Calculation (min) (ACUTE ONLY): 32 min  Charges:  $Gait Training: 8-22 mins $Therapeutic Activity: 8-22 mins                    G CodesClearnce Sorrel Jarian Longoria 27-Jun-2016, 3:54 PM Sherie Don, [place], DPT [phone]

## 2016-06-18 NOTE — Plan of Care (Signed)
Problem: Skin Integrity: Goal: Risk for impaired skin integrity will decrease Outcome: Progressing Patient given bath and discovered that his back was red. Brought it to patient attention and he stated its because he's been in bed for 3 days. Patient able to turn self but will plan to perform turns as he is not turning sufficiently. Pillows placed under his back.   Problem: Activity: Goal: Risk for activity intolerance will decrease Outcome: Progressing Per Day shift- patient unable to work with PT today as he was off the floor for tests.   Problem: Nutrition: Goal: Adequate nutrition will be maintained Outcome: Progressing Patient tolerating Dys 2 diet with thin liquids. Able to take pills whole in puree.   Problem: Respiratory: Goal: Respiratory status will improve Outcome: Progressing Patient tolerating being on room air.  Goal: Ability to maintain a clear airway will improve Outcome: Progressing Patient able to clear throat and uses Yankauer to suction.

## 2016-06-19 LAB — GLUCOSE, CAPILLARY
GLUCOSE-CAPILLARY: 104 mg/dL — AB (ref 65–99)
GLUCOSE-CAPILLARY: 115 mg/dL — AB (ref 65–99)
GLUCOSE-CAPILLARY: 131 mg/dL — AB (ref 65–99)
GLUCOSE-CAPILLARY: 130 mg/dL — AB (ref 65–99)

## 2016-06-19 LAB — PROCALCITONIN: Procalcitonin: <0.1 ng/mL

## 2016-06-19 NOTE — Plan of Care (Signed)
Problem: Activity: Goal: Ability to tolerate increased activity will improve Outcome: Progressing Patient spent time sitting in chair this am for approximately 1.5 hours.

## 2016-06-19 NOTE — Progress Notes (Signed)
PROGRESS NOTE    Jason [name]  WGN:[id number] DOB: Nov 04, 1927 DOA: 06/15/2016 PCP: Melinda Crutch, MD   Brief Narrative:  83 y.o. male with medical history significant for Paroxysmal AFib on eliquis, GERD, Barrett's esophagus, bladder CA, non-Hodgkin's lymphoma, OA, hyperlipidemia who presented to the ED with c/o generalized weakness and fall. Chest x-ray showed possible CHF with superimposed pneumonia in the middle left lung base Head CT showed no acute abnormalities, small vessel ischemic disease white matter changes, and ethmoidal paranasal sinus disease.  Assessment & Plan:   # Community acquired pneumonia: Patient is clinically improving. Continue ceftriaxone and azithromycin and bronchodilators. Patient is not hypoxic. Follow up culture results.  #Paroxysmal atrial fibrillation:continue Eliquis. Monitor in telemetry. Evaluated by cardiologist regarding possible bradycardia during the episode of unresponsiveness. Cardiologist did not think that the syncope is caused by cardiac etiology. Continue to monitor for now.  #Brief episodes of unresponsiveness on 3/15: Exact etiology unknown. As per patient's daughter, patient was suddenly blanking out and became unresponsive. There was some movement of his upper body but the daughter was not able to specifically explained.  -Neuro consult appreciated, received dose mestinon for ? Myasthenia Gravis (ruled out), however with no clinical improvement, it was discontinued. EEG with no seizure. Neurologist recommended to follow-up MG panel as outpatient as this test will take some time to be resulted. -As per neurologist,patient will need an EMG/NCS at an outpatient neurology office and this should be set up for him before discharge. Patient is to follow-up with neurologist on discharge. -echocardiogram EF of 55-60% and LV hypertrophy. -Frequent neuro check and seizure precautions.  #Hyperlipidemia: Continue statin  #History of GERD: Continue PPI  #  History of BPH: Continue Flomax and monitor.  #Fall, generalized weakness and physical deconditioning: PT, OT and social worker evaluation. Patient likely needs rehabilitation or SNF on discharge. Swallow evaluation ongoing. Patient is on dysphagia level III diet. Continue to monitor closely. CIR and social worker consulted for possible discharge to rehabilitation.  Pending SNF/inpatient rehabilitation evaluation we will continue to provide current antibiotics and supportive care. Patient will likely discharge tomorrow.   Principal Problem:   CAP (community acquired pneumonia) Active Problems:   Hyperlipidemia   GERD (gastroesophageal reflux disease)   BPH (benign prostatic hyperplasia)   PAF (paroxysmal atrial fibrillation) (HCC)   Pressure injury of skin, Unspecified.   Lobar pneumonia ([place])    DVT prophylaxis:Systemic anticoagulation  Code Status:Full code  Family Communication: Discussed with the patient's daughter at bedside Disposition Plan:Likely discharge to rehabilitation/SNF in 1-2 days    Consultants:   Neurologist  Cardiologist  Procedures:None  Antimicrobials:Azithromycin and ceftriaxone since 3/14  Subjective: Patient was seen and examined at bedtime. He was alert awake and oriented. Denies any needs. Still having some cough but denies shortness of breath or chest pain. No nausea or vomiting. Objective: Vitals:   06/19/16 0600 06/19/16 0700 06/19/16 0800 06/19/16 1200  BP: (!) 140/59 128/63 120/64   Pulse: 72 73 65   Resp: (!) 23 (!) 22 17   Temp:  97.7 F (36.5 C)  97.7 F (36.5 C)  TempSrc:  Oral  Oral  SpO2: 95% 96% 96%   Weight:      Height:        Intake/Output Summary (Last 24 hours) at 06/19/16 1223 Last data filed at 06/19/16 0957  Gross per 24 hour  Intake              446 ml  Output  450 ml  Net               -4 ml   Filed Weights   06/17/16 0500 06/18/16 0413 06/19/16 0500  Weight: 105.6 kg (232 lb 12.9 oz) 107 kg (235 lb  14.3 oz) 107 kg (235 lb 14.3 oz)    Examination:  General exam: Pleasant male lying on bed comfortable Respiratory system: Clear bilateral. Respiratory effort normal. No wheezing or crackle Cardiovascular system: Regular rate rhythm, S1-S2 normal.  No pedal edema. Gastrointestinal system: Abdomen is nondistended, soft and nontender. Normal bowel sounds heard. Central nervous system: Alert awake and oriented. No focal neurological deficit Extremities: Symmetric 5 x 5 power. Skin: No rashes, lesions or ulcers Psychiatry: Judgement and insight appear normal and age appropriate. Mood & affect appropriate.     Data Reviewed: I have personally reviewed following labs and imaging studies  CBC:  Recent Labs Lab 06/15/16 0726 06/16/16 1245 06/18/16 0346  WBC 8.6 10.5 11.6*  NEUTROABS 5.4  --   --   HGB 12.0* 12.8* 12.0*  HCT 37.4* 39.7 37.5*  MCV 85.0 84.3 84.7  PLT 173 182 287   Basic Metabolic Panel:  Recent Labs Lab 06/15/16 0726 06/15/16 1052 06/16/16 1245 06/16/16 1549 06/18/16 0346  NA 138  --  135  --  136  K 3.8  --  3.8  --  3.5  CL 105  --  104  --  105  CO2 22  --  21*  --  22  GLUCOSE 102*  --  121*  --  106*  BUN 20  --  19  --  28*  CREATININE 1.06  --  1.12  --  1.17  CALCIUM 9.0  --  8.7*  --  8.8*  MG  --  2.1 1.9 2.1  --    GFR: Estimated Creatinine Clearance: 55.5 mL/min (by C-G formula based on SCr of 1.17 mg/dL). Liver Function Tests:  Recent Labs Lab 06/15/16 0726  AST 19  ALT 11*  ALKPHOS 31*  BILITOT 0.7  PROT 6.6  ALBUMIN 3.3*   No results for input(s): LIPASE, AMYLASE in the last 168 hours.  Recent Labs Lab 06/16/16 1549  AMMONIA 27   Coagulation Profile: No results for input(s): INR, PROTIME in the last 168 hours. Cardiac Enzymes:  Recent Labs Lab 06/15/16 0726 06/16/16 1245 06/16/16 2032  CKTOTAL  --   --  125  TROPONINI 0.04* 0.04*  --    BNP (last 3 results) No results for input(s): PROBNP in the last 8760  hours. HbA1C: No results for input(s): HGBA1C in the last 72 hours. CBG:  Recent Labs Lab 06/18/16 1249 06/18/16 1730 06/18/16 2111 06/19/16 0734 06/19/16 1204  GLUCAP 115* 118* 134* 104* 115*   Lipid Profile: No results for input(s): CHOL, HDL, LDLCALC, TRIG, CHOLHDL, LDLDIRECT in the last 72 hours. Thyroid Function Tests:  Recent Labs  06/16/16 2032  TSH 1.519   Anemia Panel: No results for input(s): VITAMINB12, FOLATE, FERRITIN, TIBC, IRON, RETICCTPCT in the last 72 hours. Sepsis Labs:  Recent Labs Lab 06/15/16 1052 06/17/16 0508 06/19/16 0419  PROCALCITON <0.10 <0.10 <0.10    Recent Results (from the past 240 hour(s))  Blood culture (routine x 2)     Status: None (Preliminary result)   Collection Time: 06/15/16  9:20 AM  Result Value Ref Range Status   Specimen Description BLOOD RIGHT ANTECUBITAL  Final   Special Requests BOTTLES DRAWN AEROBIC ONLY 10CC  Final  Culture NO GROWTH 3 DAYS  Final   Report Status PENDING  Incomplete  Blood culture (routine x 2)     Status: None (Preliminary result)   Collection Time: 06/15/16  9:30 AM  Result Value Ref Range Status   Specimen Description BLOOD RIGHT HAND  Final   Special Requests BOTTLES DRAWN AEROBIC ONLY 10CC  Final   Culture NO GROWTH 3 DAYS  Final   Report Status PENDING  Incomplete  Culture, sputum-assessment     Status: None   Collection Time: 06/16/16  8:58 AM  Result Value Ref Range Status   Specimen Description SPUTUM  Final   Special Requests NONE  Final   Sputum evaluation   Final    Sputum specimen not acceptable for testing.  Please recollect.   Gram Stain Report Called to,Read Back By and Verified With: RN Micheline Rough [phone] 1153AM MLM    Report Status 06/16/2016 FINAL  Final  MRSA PCR Screening     Status: None   Collection Time: 06/16/16  2:11 PM  Result Value Ref Range Status   MRSA by PCR NEGATIVE NEGATIVE Final    Comment:        The GeneXpert MRSA Assay (FDA approved for NASAL  specimens only), is one component of a comprehensive MRSA colonization surveillance program. It is not intended to diagnose MRSA infection nor to guide or monitor treatment for MRSA infections.          Radiology Studies: Mr Cervical Spine Wo Contrast  Result Date: 06/17/2016 CLINICAL DATA:  Progressive weakness.  Drooling. EXAM: MRI CERVICAL SPINE WITHOUT CONTRAST TECHNIQUE: Multiplanar, multisequence MR imaging of the cervical spine was performed. No intravenous contrast was administered. COMPARISON:  None. FINDINGS: Alignment: Exaggerated lordosis.  No listhesis. Vertebrae: No fracture, evidence of discitis, or bone lesion. Mild marrow edema about the left C4-5 facet, degenerative appearing. Cord: Normal signal and morphology. Posterior Fossa, vertebral arteries, paraspinal tissues: Bilateral thyroid nodules, up to 12 mm on the left, below size threshold for recommend sonographic follow-up. Disc levels: C2-3: Facet arthropathy with moderate spurring on the left. Ligamentum flavum thickening. Patent canal and foramina C3-4: Facet arthropathy with spurring greater on the left. Ligamentum flavum thickening. Mild endplate ridging. Patent canal and foramina C4-5: Facet arthropathy with moderate bilateral spurring. Ligamentum flavum thickening. No impingement C5-6: Facet arthropathy with mild spurring. Ligamentum flavum thickening. Disc narrowing with posterior disc osteophyte complex. No impingement C6-7: Mild facet spurring.  Mild disc narrowing.  No impingement C7-T1:Mild facet arthropathy with possible ankylosis on the right. Mild disc narrowing. No impingement IMPRESSION: 1. No explanation for weakness. The canal and foramina are diffusely patent. No evidence of myelopathy. 2. Facet arthropathy and mild for age disc degeneration. Mild degenerative marrow edema noted about the left C4-5 facet. Electronically Signed   By: Monte Fantasia M.D.   On: 06/17/2016 12:41   Dg Swallowing Func-speech  Pathology  Result Date: 06/17/2016 Objective Swallowing Evaluation: Type of Study: MBS-Modified Barium Swallow Study Patient Details Name: Jason [name] MRN: [id number] Date of Birth: April 27, 1927 Today's Date: 06/17/2016 Time: SLP Start Time (ACUTE ONLY): 1230-SLP Stop Time (ACUTE ONLY): 1300 SLP Time Calculation (min) (ACUTE ONLY): 30 min Past Medical History: Past Medical History: Diagnosis Date . Arthritis  . Barrett esophagus  . Bladder cancer ([place]) dx'd 2992,4268  surg only . Colon polyps   adenomatous . Difficulty sleeping   OCCASIONALLY . Diverticulosis of colon (without mention of hemorrhage)  . Frequency of urination  . GERD (gastroesophageal reflux  disease)  . Hiatal hernia  . History of bladder cancer 1981  EXCISION ONLY . History of skin cancer   BASAL CELL . Hyperlipidemia  . Lymphoma, non-Hodgkin's ([place]) 2011  [place]  . Macular degeneration   LEFT EYE . Multiple nodules of lung 09/16/2011 . PAF (paroxysmal atrial fibrillation) ([place])  . Personal history of colonic polyps 07/15/2010  tubular adenoma . Prostate cancer [facility])   patient denies . PUD (peptic ulcer disease)  . Stomach cancer ([place])   NHL origin Past Surgical History: Past Surgical History: Procedure Laterality Date . APPENDECTOMY   . bone spur    left shoulder . CHOLECYSTECTOMY   . KNEE ARTHROPLASTY Left 07/30/2015  Procedure: LEFT TOTAL KNEE ARTHROPLASTY WITH COMPUTER NAVIGATION;  Surgeon: Rod Can, MD;  Location: [facility];  Service: Orthopedics;  Laterality: Left; . LASER OF PROSTATE W/ GREEN LIGHT PVP   . TENDON REPAIR    right arm . TRIGGER FINGER RELEASE    bilateral hands . WRIST SURGERY    Bilateral HPI: Harshan Kearley Wilsonis a 83 y.o.malewith medical history significant for Paroxysmal AFib, GERD, hiatal hernia, Barrett's esophagus, multiple lung nodules, bladder CA, non-Hodgkin's lymphoma, OA, hyperlipidemia who presented to the ED with c/o generalized weakness and fall. Chest x-ray showed possible CHF with superimposed pneumonia  in the middle left lung base. Head CT showed no acute abnormalities, small vessel ischemic disease white matter changes,and ethmoidal paranasal sinus disease. Barium esophagram poor esophageal motility. No stricture or mass. Barium tablet passed readily into the stomach. Negative for reflux. No Data Recorded Assessment / Plan / Recommendation CHL IP CLINICAL IMPRESSIONS 06/17/2016 Clinical Impression  Pt demonstrates a moderate oropharyngeal dysphagia with neuromuscular deficits impacting sensory and motor function. Pt observed to have bilateral ptosis, anterior spillage of secretions on the left (family reports history of left facial droop due to Bell's palsy) and weak cough strength upon arrival to exam. Despite deficits pt apepars conginitively intact and is able to follow complex commands.  Oral function chaacteraized by mild lingual weakness observed via mild lingual residuals, with functional abiltiy to masticate and transit soft solids (though endurance not taxed). Flash penetration and silent aspriation during the swallow occured with boluses larger than 75ml (estimate) due to slow laryngeal closure and epiglottic deflection. Pt was able trigger a timely swallow and eventually achieve full laryngeal closure which expelled penetrate when possible, but again sluggish movement kept vestible partialy open during bolus passage. Further silent aspiration events also occured after the swallow if pt was cued to cough, which spilled valleuular residual into the airway. Attempted chin tuck without improvement and mendelsohn with visual biofeedback (pt unable after 5 attempts with max cues). Best methods included verbal cues for small sips (bolus size consistently comparable in appearance and fucntion with 37mL teaspoon), oral hold and effortful swallow x2. Given increased residuals with thicker textures, recommend pt initiate a dys 2/thin liquid diet. Recommend water only with oral care at least 3x day with toothbrush  and paste. Pt will benefit from exercises to target hyolaryngeal excursion and base of tongue strength as well as EMST. Will f/u for education with family.  SLP Visit Diagnosis Dysphagia, oropharyngeal phase (R13.12) Attention and concentration deficit following -- Frontal lobe and executive function deficit following -- Impact on safety and function --   CHL IP TREATMENT RECOMMENDATION 06/17/2016 Treatment Recommendations Therapy as outlined in treatment plan below   Prognosis 06/17/2016 Prognosis for Safe Diet Advancement Good Barriers to Reach Goals -- Barriers/Prognosis Comment -- CHL  IP DIET RECOMMENDATION 06/17/2016 SLP Diet Recommendations Dysphagia 2 (Fine chop) solids;Thin liquid Liquid Administration via Cup Medication Administration Whole meds with puree Compensations Slow rate;Small sips/bites;Effortful swallow;Minimize environmental distractions;Clear throat intermittently Postural Changes Remain semi-upright after after feeds/meals (Comment)   CHL IP OTHER RECOMMENDATIONS 06/17/2016 Recommended Consults -- Oral Care Recommendations Oral care before and after PO Other Recommendations --   CHL IP FOLLOW UP RECOMMENDATIONS 06/17/2016 Follow up Recommendations 24 hour supervision/assistance   CHL IP FREQUENCY AND DURATION 06/17/2016 Speech Therapy Frequency (ACUTE ONLY) min 3x week Treatment Duration 2 weeks      CHL IP ORAL PHASE 06/17/2016 Oral Phase Impaired Oral - Pudding Teaspoon -- Oral - Pudding Cup -- Oral - Honey Teaspoon -- Oral - Honey Cup -- Oral - Nectar Teaspoon -- Oral - Nectar Cup -- Oral - Nectar Straw -- Oral - Thin Teaspoon -- Oral - Thin Cup Left anterior bolus loss;Lingual/palatal residue Oral - Thin Straw -- Oral - Puree WFL Oral - Mech Soft WFL Oral - Regular -- Oral - Multi-Consistency -- Oral - Pill -- Oral Phase - Comment --  CHL IP PHARYNGEAL PHASE 06/17/2016 Pharyngeal Phase Impaired Pharyngeal- Pudding Teaspoon -- Pharyngeal -- Pharyngeal- Pudding Cup -- Pharyngeal -- Pharyngeal- Honey  Teaspoon -- Pharyngeal -- Pharyngeal- Honey Cup -- Pharyngeal -- Pharyngeal- Nectar Teaspoon -- Pharyngeal -- Pharyngeal- Nectar Cup -- Pharyngeal -- Pharyngeal- Nectar Straw -- Pharyngeal -- Pharyngeal- Thin Teaspoon -- Pharyngeal -- Pharyngeal- Thin Cup Reduced anterior laryngeal mobility;Penetration/Aspiration during swallow;Reduced tongue base retraction;Penetration/Apiration after swallow;Moderate aspiration;Trace aspiration;Pharyngeal residue - valleculae;Compensatory strategies attempted (with notebox) Pharyngeal Material enters airway, passes BELOW cords without attempt by patient to eject out (silent aspiration);Material enters airway, CONTACTS cords and then ejected out;Material does not enter airway Pharyngeal- Thin Straw -- Pharyngeal -- Pharyngeal- Puree Reduced anterior laryngeal mobility;Reduced tongue base retraction;Pharyngeal residue - valleculae;Compensatory strategies attempted (with notebox) Pharyngeal -- Pharyngeal- Mechanical Soft Reduced anterior laryngeal mobility;Reduced tongue base retraction;Pharyngeal residue - valleculae;Compensatory strategies attempted (with notebox) Pharyngeal -- Pharyngeal- Regular -- Pharyngeal -- Pharyngeal- Multi-consistency -- Pharyngeal -- Pharyngeal- Pill -- Pharyngeal -- Pharyngeal Comment --  No flowsheet data found. CHL IP GO 06/16/2016 Functional Assessment Tool Used skilled clinical judgement Functional Limitations Swallowing Swallow Current Status [phone]) CK Swallow Goal Status (Y6948) CJ Swallow Discharge Status [phone]) (None) Motor Speech Current Status [phone]) (None) Motor Speech Goal Status [phone]) (None) Motor Speech Goal Status (E9937) (None) Spoken Language Comprehension Current Status (J6967) (None) Spoken Language Comprehension Goal Status (E9381) (None) Spoken Language Comprehension Discharge Status [phone]) (None) Spoken Language Expression Current Status [phone]) (None) Spoken Language Expression Goal Status [phone]) (None) Spoken Language  Expression Discharge Status [phone]) (None) Attention Current Status (I1443) (None) Attention Goal Status (X5400) (None) Attention Discharge Status [phone]) (None) Memory Current Status (P5093) (None) Memory Goal Status (O6712) (None) Memory Discharge Status (W5809) (None) Voice Current Status (X8338) (None) Voice Goal Status (S5053) (None) Voice Discharge Status (Z7673) (None) Other Speech-Language Pathology Functional Limitation Current Status (A1937) (None) Other Speech-Language Pathology Functional Limitation Goal Status (T0240) (None) Other Speech-Language Pathology Functional Limitation Discharge Status [phone]) (None) DeBlois, Katherene Ponto 06/17/2016, 2:21 PM                   Scheduled Meds: . apixaban  5 mg Oral BID  . azithromycin  500 mg Oral Daily  . cefTRIAXone (ROCEPHIN)  IV  1 g Intravenous Q24H  . docusate sodium  100 mg Oral BID  . omega-3 acid ethyl esters  1 g Oral BID  .  pantoprazole  80 mg Oral Q1200  . senna  1 tablet Oral BID  . simvastatin  40 mg Oral QHS  . sodium chloride flush  3 mL Intravenous Q12H  . tamsulosin  0.4 mg Oral QHS   Continuous Infusions:    LOS: 3 days    Chanse Kagel Tanna Furry, MD [facility] Pager [phone]  If 7PM-7AM, please contact night-coverage [email or web address] Password [facility] 06/19/2016, 12:23 PM

## 2016-06-19 NOTE — Plan of Care (Signed)
Problem: Activity: Goal: Risk for activity intolerance will decrease Outcome: Progressing Patient requested to be up to chair at 6 am. Returned to bed at 8 am.   Problem: Respiratory: Goal: Respiratory status will improve Outcome: Completed/Met Date Met: 06/19/16 Patient tolerates room air

## 2016-06-19 NOTE — Evaluation (Signed)
Occupational Therapy Evaluation Patient Details Name: KAELEB EMOND MRN: [id number] DOB: 02/28/1928 Today's Date: 06/19/2016    History of Present Illness WHITMAN MEINHARDT is a 83 y.o. male with medical history significant for Paroxysmal AFib on eliquis, GERD, Barrett's esophagus, bladder CA, non-Hodgkin's lymphoma, OA, hyperlipidemia who presented to the ED with c/o generalized weakness and fall.   Clinical Impression   Pt. Is requiring s with self feeding for aspiration risk. Pt. Is Min A to Max A with ADLs.Pt. Is Mod A with bed mobility and Min A with sit to stand from bed. Pt. Would benefit from [facility] to return to Mod I level. Pt. Lives alone and wants to return to previous level of function.    Follow Up Recommendations  SNF    Equipment Recommendations  None recommended by OT    Recommendations for Other Services       Precautions / Restrictions Precautions Precautions: Fall Restrictions Weight Bearing Restrictions: No      Mobility Bed Mobility Overal bed mobility: Needs Assistance Bed Mobility: Supine to Sit     Supine to sit: Mod assist;HOB elevated     General bed mobility comments: pt transferred to sitting on L side of bed with use of bed rails and mod A at trunk with use of bed pads to achieve sitting EOB  Transfers       Sit to Stand: Min assist         General transfer comment: increased time, VC'ing for bilateral hand placement and min A to rise from bed    Balance                                            ADL Overall ADL's : Needs assistance/impaired Eating/Feeding: Supervision/ safety;Cueing for safety   Grooming: Wash/dry hands;Wash/dry face;Oral care;Min guard   Upper Body Bathing: Supervision/ safety;Set up;Sitting   Lower Body Bathing: Moderate assistance;Sit to/from stand   Upper Body Dressing : Minimal assistance;Sitting   Lower Body Dressing: Maximal assistance;Sit to/from stand   Toilet Transfer: Minimal  assistance;Moderate assistance;Ambulation   Toileting- Clothing Manipulation and Hygiene: Moderate assistance       Functional mobility during ADLs: Minimal assistance General ADL Comments: Mod a with supien to sit eob for adsl.  S for feeding .     Vision Baseline Vision/History: Wears glasses Wears Glasses: Reading only Patient Visual Report: No change from baseline       Perception     Praxis      Pertinent Vitals/Pain Pain Assessment: No/denies pain     Hand Dominance     Extremity/Trunk Assessment Upper Extremity Assessment Upper Extremity Assessment: Generalized weakness           Communication Communication Communication: No difficulties   Cognition Arousal/Alertness: Awake/alert Behavior During Therapy: WFL for tasks assessed/performed Overall Cognitive Status: Within Functional Limits for tasks assessed                     General Comments       Exercises       Shoulder Instructions      Home Living Family/patient expects to be discharged to:: Skilled nursing facility ([facility] ? Kids say pt needs follow up therapy) Living Arrangements: Alone  Home Equipment: [place] - 2 wheels;Bedside commode          Prior Functioning/Environment Level of Independence: Independent with assistive device(s)                 OT Problem List: Decreased strength;Decreased activity tolerance;Impaired balance (sitting and/or standing);Decreased knowledge of use of DME or AE      OT Treatment/Interventions: Self-care/ADL training;Therapeutic exercise;DME and/or AE instruction;Therapeutic activities;Patient/family education    OT Goals(Current goals can be found in the care plan section) Acute Rehab OT Goals Patient Stated Goal: To get better OT Goal Formulation: With patient Time For Goal Achievement: 07/03/16 Potential to Achieve Goals: Good ADL Goals Pt Will Perform Grooming: with modified  independence;standing Pt Will Perform Upper Body Bathing: with modified independence;sitting Pt Will Perform Lower Body Bathing: with modified independence;sit to/from stand Pt Will Perform Upper Body Dressing: with modified independence;sitting Pt Will Perform Lower Body Dressing: with modified independence;sit to/from stand Pt Will Transfer to Toilet: with modified independence;ambulating;regular height toilet Pt Will Perform Toileting - Clothing Manipulation and hygiene: with modified independence;sit to/from stand  OT Frequency: Min 2X/week   Barriers to D/C: Decreased caregiver support          Co-evaluation              End of Session Equipment Utilized During Treatment: Gait belt;Rolling walker  Activity Tolerance: Patient tolerated treatment well Patient left: in bed;with call bell/phone within reach;with bed alarm set  OT Visit Diagnosis: Unsteadiness on feet (R26.81);Muscle weakness (generalized) (M62.81)                ADL either performed or assessed with clinical judgement  Time: 8366-2947 OT Time Calculation (min): 60 min Charges:  OT General Charges $OT Visit: 1 Procedure OT Evaluation $OT Eval Moderate Complexity: 1 Procedure OT Treatments $Self Care/Home Management : 23-37 mins G-Codes:     Clinical judgement  Marigny Borre 06/19/2016, 9:37 AM

## 2016-06-20 DIAGNOSIS — I639 Cerebral infarction, unspecified: Secondary | ICD-10-CM | POA: Diagnosis not present

## 2016-06-20 DIAGNOSIS — E784 Other hyperlipidemia: Secondary | ICD-10-CM | POA: Diagnosis not present

## 2016-06-20 DIAGNOSIS — R739 Hyperglycemia, unspecified: Secondary | ICD-10-CM

## 2016-06-20 DIAGNOSIS — D72829 Elevated white blood cell count, unspecified: Secondary | ICD-10-CM

## 2016-06-20 DIAGNOSIS — E669 Obesity, unspecified: Secondary | ICD-10-CM | POA: Diagnosis present

## 2016-06-20 DIAGNOSIS — G8190 Hemiplegia, unspecified affecting unspecified side: Secondary | ICD-10-CM | POA: Diagnosis not present

## 2016-06-20 DIAGNOSIS — K219 Gastro-esophageal reflux disease without esophagitis: Secondary | ICD-10-CM | POA: Diagnosis not present

## 2016-06-20 DIAGNOSIS — E785 Hyperlipidemia, unspecified: Secondary | ICD-10-CM | POA: Diagnosis not present

## 2016-06-20 DIAGNOSIS — Z6831 Body mass index (BMI) 31.0-31.9, adult: Secondary | ICD-10-CM | POA: Diagnosis not present

## 2016-06-20 DIAGNOSIS — H3530 Unspecified macular degeneration: Secondary | ICD-10-CM | POA: Diagnosis present

## 2016-06-20 DIAGNOSIS — R8290 Unspecified abnormal findings in urine: Secondary | ICD-10-CM | POA: Diagnosis present

## 2016-06-20 DIAGNOSIS — R1310 Dysphagia, unspecified: Secondary | ICD-10-CM | POA: Diagnosis not present

## 2016-06-20 DIAGNOSIS — I6789 Other cerebrovascular disease: Secondary | ICD-10-CM | POA: Diagnosis not present

## 2016-06-20 DIAGNOSIS — J189 Pneumonia, unspecified organism: Secondary | ICD-10-CM | POA: Diagnosis not present

## 2016-06-20 DIAGNOSIS — R531 Weakness: Secondary | ICD-10-CM

## 2016-06-20 DIAGNOSIS — I509 Heart failure, unspecified: Secondary | ICD-10-CM | POA: Diagnosis not present

## 2016-06-20 DIAGNOSIS — M1990 Unspecified osteoarthritis, unspecified site: Secondary | ICD-10-CM | POA: Diagnosis not present

## 2016-06-20 DIAGNOSIS — H04129 Dry eye syndrome of unspecified lacrimal gland: Secondary | ICD-10-CM | POA: Diagnosis not present

## 2016-06-20 DIAGNOSIS — G459 Transient cerebral ischemic attack, unspecified: Secondary | ICD-10-CM | POA: Diagnosis not present

## 2016-06-20 DIAGNOSIS — K449 Diaphragmatic hernia without obstruction or gangrene: Secondary | ICD-10-CM | POA: Diagnosis not present

## 2016-06-20 DIAGNOSIS — I480 Paroxysmal atrial fibrillation: Secondary | ICD-10-CM | POA: Diagnosis not present

## 2016-06-20 DIAGNOSIS — Z859 Personal history of malignant neoplasm, unspecified: Secondary | ICD-10-CM | POA: Diagnosis not present

## 2016-06-20 DIAGNOSIS — G8191 Hemiplegia, unspecified affecting right dominant side: Secondary | ICD-10-CM | POA: Diagnosis present

## 2016-06-20 DIAGNOSIS — Z8572 Personal history of non-Hodgkin lymphomas: Secondary | ICD-10-CM | POA: Diagnosis not present

## 2016-06-20 DIAGNOSIS — R05 Cough: Secondary | ICD-10-CM | POA: Diagnosis not present

## 2016-06-20 DIAGNOSIS — I5031 Acute diastolic (congestive) heart failure: Secondary | ICD-10-CM

## 2016-06-20 DIAGNOSIS — Z8546 Personal history of malignant neoplasm of prostate: Secondary | ICD-10-CM | POA: Diagnosis not present

## 2016-06-20 DIAGNOSIS — D649 Anemia, unspecified: Secondary | ICD-10-CM | POA: Diagnosis not present

## 2016-06-20 DIAGNOSIS — G4700 Insomnia, unspecified: Secondary | ICD-10-CM | POA: Diagnosis not present

## 2016-06-20 DIAGNOSIS — R471 Dysarthria and anarthria: Secondary | ICD-10-CM | POA: Diagnosis not present

## 2016-06-20 DIAGNOSIS — N400 Enlarged prostate without lower urinary tract symptoms: Secondary | ICD-10-CM | POA: Diagnosis not present

## 2016-06-20 DIAGNOSIS — E876 Hypokalemia: Secondary | ICD-10-CM | POA: Diagnosis present

## 2016-06-20 DIAGNOSIS — Z8551 Personal history of malignant neoplasm of bladder: Secondary | ICD-10-CM

## 2016-06-20 DIAGNOSIS — J309 Allergic rhinitis, unspecified: Secondary | ICD-10-CM | POA: Diagnosis not present

## 2016-06-20 DIAGNOSIS — Z9049 Acquired absence of other specified parts of digestive tract: Secondary | ICD-10-CM | POA: Diagnosis not present

## 2016-06-20 DIAGNOSIS — K5900 Constipation, unspecified: Secondary | ICD-10-CM | POA: Diagnosis not present

## 2016-06-20 DIAGNOSIS — I739 Peripheral vascular disease, unspecified: Secondary | ICD-10-CM | POA: Diagnosis present

## 2016-06-20 DIAGNOSIS — R262 Difficulty in walking, not elsewhere classified: Secondary | ICD-10-CM | POA: Diagnosis not present

## 2016-06-20 DIAGNOSIS — M6281 Muscle weakness (generalized): Secondary | ICD-10-CM | POA: Diagnosis not present

## 2016-06-20 DIAGNOSIS — I454 Nonspecific intraventricular block: Secondary | ICD-10-CM | POA: Diagnosis present

## 2016-06-20 DIAGNOSIS — I10 Essential (primary) hypertension: Secondary | ICD-10-CM | POA: Diagnosis present

## 2016-06-20 DIAGNOSIS — Z85028 Personal history of other malignant neoplasm of stomach: Secondary | ICD-10-CM | POA: Diagnosis not present

## 2016-06-20 DIAGNOSIS — D62 Acute posthemorrhagic anemia: Secondary | ICD-10-CM | POA: Diagnosis not present

## 2016-06-20 DIAGNOSIS — R29810 Facial weakness: Secondary | ICD-10-CM | POA: Diagnosis not present

## 2016-06-20 DIAGNOSIS — R4701 Aphasia: Secondary | ICD-10-CM | POA: Diagnosis present

## 2016-06-20 DIAGNOSIS — Z85828 Personal history of other malignant neoplasm of skin: Secondary | ICD-10-CM | POA: Diagnosis not present

## 2016-06-20 DIAGNOSIS — Z9181 History of falling: Secondary | ICD-10-CM | POA: Diagnosis not present

## 2016-06-20 DIAGNOSIS — J181 Lobar pneumonia, unspecified organism: Secondary | ICD-10-CM | POA: Diagnosis not present

## 2016-06-20 DIAGNOSIS — N183 Chronic kidney disease, stage 3 (moderate): Secondary | ICD-10-CM

## 2016-06-20 DIAGNOSIS — I6300 Cerebral infarction due to thrombosis of unspecified precerebral artery: Secondary | ICD-10-CM | POA: Diagnosis not present

## 2016-06-20 DIAGNOSIS — R4781 Slurred speech: Secondary | ICD-10-CM | POA: Diagnosis present

## 2016-06-20 DIAGNOSIS — R1312 Dysphagia, oropharyngeal phase: Secondary | ICD-10-CM | POA: Diagnosis not present

## 2016-06-20 DIAGNOSIS — Z96652 Presence of left artificial knee joint: Secondary | ICD-10-CM | POA: Diagnosis present

## 2016-06-20 DIAGNOSIS — I6302 Cerebral infarction due to thrombosis of basilar artery: Secondary | ICD-10-CM | POA: Diagnosis not present

## 2016-06-20 DIAGNOSIS — K22719 Barrett's esophagus with dysplasia, unspecified: Secondary | ICD-10-CM | POA: Diagnosis not present

## 2016-06-20 DIAGNOSIS — N179 Acute kidney failure, unspecified: Secondary | ICD-10-CM

## 2016-06-20 DIAGNOSIS — K2270 Barrett's esophagus without dysplasia: Secondary | ICD-10-CM | POA: Diagnosis present

## 2016-06-20 DIAGNOSIS — Z7901 Long term (current) use of anticoagulants: Secondary | ICD-10-CM | POA: Diagnosis not present

## 2016-06-20 LAB — CULTURE, BLOOD (ROUTINE X 2)
CULTURE: NO GROWTH
Culture: NO GROWTH

## 2016-06-20 LAB — GLUCOSE, CAPILLARY
GLUCOSE-CAPILLARY: 97 mg/dL (ref 65–99)
GLUCOSE-CAPILLARY: 100 mg/dL — AB (ref 65–99)

## 2016-06-20 MED ORDER — LEVOFLOXACIN 500 MG PO TABS: 500.0000 mg | ORAL_TABLET | Freq: Every day | ORAL | 0 refills | Status: AC

## 2016-06-20 NOTE — Progress Notes (Signed)
Went over all d/c paperwork with pt and his daughter

## 2016-06-20 NOTE — Care Management Important Message (Signed)
Important Message  Patient Details  Name: Jason [name] MRN: [id number] Date of Birth: [birth year]   Medicare Important Message Given:  Yes    Nathen May 06/20/2016, 1:03 PM

## 2016-06-20 NOTE — Care Management Note (Signed)
Case Management Note  Patient Details  Name: DAYYAN KRIST MRN: [id number] Date of Birth: 16-Feb-1928  Subjective/Objective:   Presents with CAP, Pafib, episode of unresponsiveness, for dc today to snf, CSW following.                  Action/Plan:   Expected Discharge Date:  06/20/16               Expected Discharge Plan:  Skilled Nursing Facility  In-House Referral:  Clinical Social Work  Discharge planning Services  CM Consult  Post Acute Care Choice:    Choice offered to:     DME Arranged:    DME Agency:     HH Arranged:    [place] Agency:     Status of Service:  Completed, signed off  If discussed at [employer], dates discussed:    Additional Comments:  Zenon Mayo, RN 06/20/2016, 2:52 PM

## 2016-06-20 NOTE — NC FL2 (Signed)
[place] LEVEL OF CARE SCREENING TOOL     IDENTIFICATION  Patient Name: Jason [name] Birthdate: Nov 02, 1927 Sex: male Admission Date (Current Location): 06/15/2016  [facility] and [place] Number:  Herbalist and Address:  [facility], [place] [address] [place], [place] [address]      Provider Number: [id number]  Attending Physician Name and Address:  Rosita Fire, MD  Relative Name and Phone Number:       Current Level of Care: Hospital Recommended Level of Care: [place] Prior Approval Number:    Date Approved/Denied:   PASRR Number: [id number]  Discharge Plan: SNF    Current Diagnoses: Patient Active Problem List   Diagnosis Date Noted  . Weakness   . Community acquired pneumonia 06/16/2016  . Unresponsiveness   . CAP (community acquired pneumonia) 06/15/2016  . PAF (paroxysmal atrial fibrillation) ([place]) 06/15/2016  . Pressure injury of skin 06/15/2016  . Lobar pneumonia ([place])   . Acute congestive heart failure ([place])   . Primary osteoarthritis of left knee 07/30/2015  . BPH (benign prostatic hyperplasia) 07/16/2012  . Multiple nodules of lung 09/16/2011  . GERD (gastroesophageal reflux disease) 06/14/2011  . Constipation, chronic 06/14/2011  . NHL (non-Hodgkin's lymphoma) ([place]) 01/03/2010  . Malignant neoplasm of bladder ([place]) 05/28/2007  . Hyperlipidemia 05/28/2007  . HIATAL HERNIA 05/28/2007  . BARRETT'S ESOPHAGUS, HX OF 05/28/2007    Orientation RESPIRATION BLADDER Height & Weight     Self, Situation, Time, Place  Normal Continent Weight: 233 lb 4 oz (105.8 kg) Height:  5\' 10"  (177.8 cm)  BEHAVIORAL SYMPTOMS/MOOD NEUROLOGICAL BOWEL NUTRITION STATUS      Continent Diet  AMBULATORY STATUS COMMUNICATION OF NEEDS Skin   Limited Assist Verbally Normal                       Personal Care Assistance Level of Assistance  Bathing, Dressing Bathing Assistance: Limited assistance    Dressing Assistance: Limited assistance     Functional Limitations Info             SPECIAL CARE FACTORS FREQUENCY  PT (By licensed PT), OT (By licensed OT)     PT Frequency: 5/wk OT Frequency: 5/wk            Contractures      Additional Factors Info  Code Status, Allergies Code Status Info: FULL Allergies Info: NKA           Current Medications (06/20/2016):  This is the current hospital active medication list Current Facility-Administered Medications  Medication Dose Route Frequency Provider Last Rate Last Dose  . acetaminophen (TYLENOL) tablet 650 mg  650 mg Oral Q6H PRN Brenton Grills, PA-C       Or  . acetaminophen (TYLENOL) suppository 650 mg  650 mg Rectal Q6H PRN Brenton Grills, PA-C      . apixaban (ELIQUIS) tablet 5 mg  5 mg Oral BID Dron Tanna Furry, MD   5 mg at 06/20/16 0951  . azithromycin (ZITHROMAX) tablet 500 mg  500 mg Oral Daily Carlean Jews, RPH   500 mg at 06/20/16 0951  . cefTRIAXone (ROCEPHIN) 1 g in dextrose 5 % 50 mL IVPB  1 g Intravenous Q24H Brenton Grills, PA-C   1 g at 06/20/16 0951  . docusate sodium (COLACE) capsule 100 mg  100 mg Oral BID Brenton Grills, PA-C   100 mg at 06/20/16 0951  .  omega-3 acid ethyl esters (LOVAZA) capsule 1 g  1 g Oral BID Brenton Grills, PA-C   1 g at 06/20/16 0951  . ondansetron (ZOFRAN) tablet 4 mg  4 mg Oral Q6H PRN Brenton Grills, PA-C       Or  . ondansetron [facility]) injection 4 mg  4 mg Intravenous Q6H PRN Brenton Grills, PA-C      . pantoprazole (PROTONIX) EC tablet 80 mg  80 mg Oral Q1200 Brenton Grills, PA-C   80 mg at 06/19/16 1349  . [place]   Oral PRN Dron Tanna Furry, MD      . [facility]) tablet 8.6 mg  1 tablet Oral BID Brenton Grills, PA-C   8.6 mg at 06/20/16 0951  . simvastatin (ZOCOR) tablet 40 mg  40 mg Oral QHS Brenton Grills, PA-C   40 mg at 06/19/16 2118  . sodium chloride flush (NS) 0.9 % injection 3 mL  3 mL  Intravenous Q12H Brenton Grills, PA-C   3 mL at 06/20/16 1624  . tamsulosin (FLOMAX) capsule 0.4 mg  0.4 mg Oral QHS Brenton Grills, PA-C   0.4 mg at 06/19/16 2118  . traZODone (DESYREL) tablet 50 mg  50 mg Oral QHS PRN Brenton Grills, PA-C         Discharge Medications: Please see discharge summary for a list of discharge medications.  Relevant Imaging Results:  Relevant Lab Results:   Additional Information SS#: [id number]  Jorge Ny, LCSW

## 2016-06-20 NOTE — Progress Notes (Signed)
Pt [facility] per [facility]  Daughter aware of d/c has pt's glasses & cellphone. Pt dressed in his own pajamas per staff prior to d/c Called report to [employer] spoke to [employer] RN

## 2016-06-20 NOTE — Progress Notes (Signed)
  Speech Language Pathology Treatment: Dysphagia  Patient Details Name: Jason [name] MRN: [id number] DOB: 21-Oct-1927 Today's Date: 06/20/2016 Time: 4098-1191 SLP Time Calculation (min) (ACUTE ONLY): 25 min  Assessment / Plan / Recommendation Clinical Impression  Pt seen for f/u, pt and family and to verbalize swallowing precautions, need for oral care. SLp was explicit in oral care procedure. Pt able to complete 25 reps of EMST 150 set at 30 cmH2O and 10 reps of chin tuck against resistance and a 30 second isometric hold. No signs of aspiration with small sips of water, cues for increased effort with swallowing. Pt to continue current course of care.   HPI HPI: Jason Kortz Wilsonis a 83 y.o.malewith medical history significant for Paroxysmal AFib, GERD, hiatal hernia, Barrett's esophagus, multiple lung nodules, bladder CA, non-Hodgkin's lymphoma, OA, hyperlipidemia who presented to the ED with c/o generalized weakness and fall. Chest x-ray showed possible CHF with superimposed pneumonia in the middle left lung base. Head CT showed no acute abnormalities, small vessel ischemic disease white matter changes,and ethmoidal paranasal sinus disease. Barium esophagram poor esophageal motility. No stricture or mass. Barium tablet passed readily into the stomach. Negative for reflux.      SLP Plan  Continue with current plan of care       Recommendations  Diet recommendations: Dysphagia 3 (mechanical soft);Thin liquid Liquids provided via: Cup;No straw Medication Administration: Whole meds with puree Supervision: Patient able to self feed;Full supervision/cueing for compensatory strategies Compensations: Slow rate;Small sips/bites;Effortful swallow;Multiple dry swallows after each bite/sip                Plan: Continue with current plan of care       GO               [facility], MA CCC-SLP [phone]  Jason [name] 06/20/2016, 3:16 PM

## 2016-06-20 NOTE — Progress Notes (Signed)
CSW met with pt and pt dtr to discuss SNF- they are in agreement since [facility] does not have any beds. They are familiar with SNF and would like [facility] who are able to accept pt today.  Patient will discharge to [facility] Anticipated discharge date: 3/19 Family notified: pt dtr at bedside Transportation by [facility]- scheduled for 2:30pm  CSW signing off.  Jorge Ny, LCSW Clinical Social Worker [phone]

## 2016-06-20 NOTE — Discharge Summary (Addendum)
Physician Discharge Summary  ABDULAI [name] JKD:[id number] DOB: 02/04/1928 DOA: 06/15/2016  PCP: Melinda Crutch, MD  Admit date: 06/15/2016 Discharge date: 06/20/2016  Admitted From:home Disposition:snf  Recommendations for Outpatient Follow-up:  1. Follow up with PCP in 1-2 weeks 2. Please obtain BMP/CBC in one week 3.   Please follow up neurologist for pending labs and further work up.  Home Health: snf Equipment/Devices:none Discharge Condition:stable CODE STATUS:full Diet recommendation:Diet recommendations: Thin liquid;Dysphagia 2 (fine chop) Liquids provided via: Cup;No straw Medication Administration: Whole meds with puree Supervision: Patient able to self feed;Full supervision/cueing for compensatory strategies Compensations: Slow rate;Small sips/bites;Effortful swallow;Multiple dry swallows after each bite/sip  Brief/Interim Summary: 83 y.o.malewith medical history significant for Paroxysmal AFib on eliquis, GERD, Barrett's esophagus, bladder CA, non-Hodgkin's lymphoma, OA, hyperlipidemia who presented to the ED with c/o generalized weakness and fall. Chest x-ray showed possible CHF (unspecified) with superimposed pneumonia in the middle left lung base Head CT showed no acute abnormalities, small vessel ischemic disease white matter changes,and ethmoidal paranasal sinus disease.  # Community acquired pneumonia: Patient is clinically improving. Changed antibiotics to levaquin till 3/21 to complete the course. Patient is not hypoxic. Clinically improved.   #Paroxysmal atrial fibrillation:continue Eliquis. Monitor in telemetry. Evaluated by cardiologist regarding possible bradycardia during the episode of unresponsiveness. Cardiologist did not think that the syncope is caused by cardiac etiology. Continue to monitor for now.  #Brief episodes of unresponsiveness on 3/15: Exact etiology unknown. As per patient's daughter, patient was suddenly blanking out and became unresponsive.  There was some movement of his upper body but the daughter was not able to specifically explained.  -Neuro consult appreciated, received dose mestinon for ? Myasthenia Gravis (ruled out), however with no clinical improvement, it was discontinued. EEG with no seizure. Neurologist recommended to follow-up MG panel as outpatient as this test will take some time to be resulted. I discussed this with the patient's daughter. -As per neurologist,patient will need an EMG/NCS at an outpatient neurology office. Patient is to follow-up with neurologist on discharge. - ambulatory referral to neurology clinic.  -echocardiogram EF of 55-60% and LV hypertrophy.  #Hyperlipidemia: Continue statin  #History of GERD: Continue PPI  # History of BPH: Continue Flomax and monitor.  #Fall, generalized weakness and physical deconditioning: PT, OT and social worker evaluation. DC to rehab/snf. Patient is on dysphagia level III diet.   Patient is clinically improved. Denies any needs. Discussed with the social worker planning to transfer his care to SNF. I discussed with the patient's daughter today regarding outpatient follow-up including neurologist.  Discharge Diagnoses:  Principal Problem:   CAP (community acquired pneumonia) Active Problems:   Hyperlipidemia   GERD (gastroesophageal reflux disease)   BPH (benign prostatic hyperplasia)   PAF (paroxysmal atrial fibrillation) (HCC)   Pressure injury of skin   Lobar pneumonia ([place])   Acute congestive heart failure ([place])   Unresponsiveness   Community acquired pneumonia   Weakness   History of bladder cancer   Dysphagia   Hyperglycemia   Stage 3 chronic kidney disease   Acute blood loss anemia   Leukocytosis    Discharge Instructions  Discharge Instructions    Ambulatory referral to Neurology    Complete by:  As directed    An appointment is requested in approximately: 2 weeks   Call MD for:  difficulty breathing, headache or visual  disturbances    Complete by:  As directed    Call MD for:  extreme fatigue    Complete by:  As directed    Call MD for:  hives    Complete by:  As directed    Call MD for:  persistant dizziness or light-headedness    Complete by:  As directed    Call MD for:  persistant nausea and vomiting    Complete by:  As directed    Call MD for:  severe uncontrolled pain    Complete by:  As directed    Call MD for:  temperature >100.4    Complete by:  As directed    Diet - low sodium heart healthy    Complete by:  As directed    Diet recommendations: Thin liquid;Dysphagia 2 (fine chop) Liquids provided via: Cup;No straw Medication Administration: Whole meds with puree Supervision: Patient able to self feed;Full supervision/cueing for compensatory strategies Compensations: Slow rate;Small sips/bites;Effortful swallow;Multiple dry swallows after each bite/sip   Discharge instructions    Complete by:  As directed    Please follow up with neurologist for pending lab result including results of myasthenia gravis lab ordered by neurologist. As per neurologist,patient will need an EMG/NCS at an outpatient neurology office.   Increase activity slowly    Complete by:  As directed      Allergies as of 06/20/2016   No Known Allergies     Medication List    TAKE these medications   ELIQUIS 5 MG Tabs tablet Generic drug:  apixaban Take 5 mg by mouth 2 (two) times daily.   esomeprazole 40 MG capsule Commonly known as:  NEXIUM TAKE 1 CAPSULE (40 MG TOTAL) BY MOUTH 2 (TWO) TIMES DAILY BEFORE A MEAL.   fish oil-omega-3 fatty acids 1000 MG capsule Take 1 g by mouth 2 (two) times daily.   ICAPS MV PO Take by mouth 2 (two) times daily.   levofloxacin 500 MG tablet Commonly known as:  LEVAQUIN Take 1 tablet (500 mg total) by mouth daily.   loratadine 10 MG tablet Commonly known as:  CLARITIN Take 10 mg by mouth daily as needed for allergies.   simvastatin 40 MG tablet Commonly known as:   ZOCOR Take 40 mg by mouth at bedtime.   SYSTANE 0.4-0.3 % Soln Generic drug:  Polyethyl Glycol-Propyl Glycol Apply 1 drop to eye daily as needed (dry eyes).   tamsulosin 0.4 MG Caps capsule Commonly known as:  FLOMAX Take 0.4 mg by mouth at bedtime.   traZODone 50 MG tablet Commonly known as:  DESYREL Take 50 mg by mouth at bedtime as needed for sleep.      Follow-up Information    Melinda Crutch, MD. Schedule an appointment as soon as possible for a visit in 1 week(s).   Specialty:  Family Medicine Contact information: [place] [address] [place], [place]. Schedule an appointment as soon as possible for a visit in 2 week(s).   Specialty:  Neurology Why:  please follow up pending lab results. Contact information: [address] [place] [address] [phone]          No Known Allergies  Consultations: Neurology Cardiology  Procedures/Studies: None  Subjective: Patient was seen and examined at bedside. Reported doing good. Denied headache, dizziness, nausea, vomiting, chest pain, shortness of breath or abdomen pain.  Discharge Exam: Vitals:   06/20/16 0700 06/20/16 1245  BP: (!) 126/94 (!) 151/65  Pulse: (!) 56   Resp: 15   Temp: 97.3 F (36.3 C) 97.8 F (36.6  C)   Vitals:   06/20/16 0500 06/20/16 0600 06/20/16 0700 06/20/16 1245  BP: (!) 123/59 136/64 (!) 126/94 (!) 151/65  Pulse: 64 65 (!) 56   Resp: 19 (!) 21 15   Temp:   97.3 F (36.3 C) 97.8 F (36.6 C)  TempSrc:   Oral Oral  SpO2: 91% 95% 98%   Weight: 105.8 kg (233 lb 4 oz)     Height:        General: Pt is alert, awake, not in acute distress Cardiovascular: RRR, S1/S2 +, no rubs, no gallops Respiratory: CTA bilaterally, no wheezing, no rhonchi Abdominal: Soft, NT, ND, bowel sounds + Extremities: no edema, no cyanosis    The results of significant diagnostics from this hospitalization (including imaging, microbiology, ancillary and  laboratory) are listed below for reference.     Microbiology: Recent Results (from the past 240 hour(s))  Blood culture (routine x 2)     Status: None (Preliminary result)   Collection Time: 06/15/16  9:20 AM  Result Value Ref Range Status   Specimen Description BLOOD RIGHT ANTECUBITAL  Final   Special Requests BOTTLES DRAWN AEROBIC ONLY 10CC  Final   Culture NO GROWTH 4 DAYS  Final   Report Status PENDING  Incomplete  Blood culture (routine x 2)     Status: None (Preliminary result)   Collection Time: 06/15/16  9:30 AM  Result Value Ref Range Status   Specimen Description BLOOD RIGHT HAND  Final   Special Requests BOTTLES DRAWN AEROBIC ONLY 10CC  Final   Culture NO GROWTH 4 DAYS  Final   Report Status PENDING  Incomplete  Culture, sputum-assessment     Status: None   Collection Time: 06/16/16  8:58 AM  Result Value Ref Range Status   Specimen Description SPUTUM  Final   Special Requests NONE  Final   Sputum evaluation   Final    Sputum specimen not acceptable for testing.  Please recollect.   Gram Stain Report Called to,Read Back By and Verified With: RN Micheline Rough [phone] 1153AM MLM    Report Status 06/16/2016 FINAL  Final  MRSA PCR Screening     Status: None   Collection Time: 06/16/16  2:11 PM  Result Value Ref Range Status   MRSA by PCR NEGATIVE NEGATIVE Final    Comment:        The GeneXpert MRSA Assay (FDA approved for NASAL specimens only), is one component of a comprehensive MRSA colonization surveillance program. It is not intended to diagnose MRSA infection nor to guide or monitor treatment for MRSA infections.      Labs: BNP (last 3 results)  Recent Labs  06/15/16 0911  BNP 408.1*   Basic Metabolic Panel:  Recent Labs Lab 06/15/16 0726 06/15/16 1052 06/16/16 1245 06/16/16 1549 06/18/16 0346  NA 138  --  135  --  136  K 3.8  --  3.8  --  3.5  CL 105  --  104  --  105  CO2 22  --  21*  --  22  GLUCOSE 102*  --  121*  --  106*  BUN 20  --   19  --  28*  CREATININE 1.06  --  1.12  --  1.17  CALCIUM 9.0  --  8.7*  --  8.8*  MG  --  2.1 1.9 2.1  --    Liver Function Tests:  Recent Labs Lab 06/15/16 0726  AST 19  ALT 11*  ALKPHOS  31*  BILITOT 0.7  PROT 6.6  ALBUMIN 3.3*   No results for input(s): LIPASE, AMYLASE in the last 168 hours.  Recent Labs Lab 06/16/16 1549  AMMONIA 27   CBC:  Recent Labs Lab 06/15/16 0726 06/16/16 1245 06/18/16 0346  WBC 8.6 10.5 11.6*  NEUTROABS 5.4  --   --   HGB 12.0* 12.8* 12.0*  HCT 37.4* 39.7 37.5*  MCV 85.0 84.3 84.7  PLT 173 182 167   Cardiac Enzymes:  Recent Labs Lab 06/15/16 0726 06/16/16 1245 06/16/16 2032  CKTOTAL  --   --  125  TROPONINI 0.04* 0.04*  --    BNP: Invalid input(s): POCBNP CBG:  Recent Labs Lab 06/19/16 0734 06/19/16 1204 06/19/16 1554 06/19/16 2139 06/20/16 0747  GLUCAP 104* 115* 131* 130* 97   D-Dimer No results for input(s): DDIMER in the last 72 hours. Hgb A1c No results for input(s): HGBA1C in the last 72 hours. Lipid Profile No results for input(s): CHOL, HDL, LDLCALC, TRIG, CHOLHDL, LDLDIRECT in the last 72 hours. Thyroid function studies No results for input(s): TSH, T4TOTAL, T3FREE, THYROIDAB in the last 72 hours.  Invalid input(s): FREET3 Anemia work up No results for input(s): VITAMINB12, FOLATE, FERRITIN, TIBC, IRON, RETICCTPCT in the last 72 hours. Urinalysis    Component Value Date/Time   COLORURINE YELLOW 06/15/2016 0856   APPEARANCEUR CLEAR 06/15/2016 0856   LABSPEC 1.015 06/15/2016 0856   PHURINE 6.5 06/15/2016 0856   GLUCOSEU NEGATIVE 06/15/2016 0856   HGBUR NEGATIVE 06/15/2016 0856   BILIRUBINUR NEGATIVE 06/15/2016 0856   KETONESUR NEGATIVE 06/15/2016 0856   PROTEINUR NEGATIVE 06/15/2016 0856   UROBILINOGEN 1.0 07/07/2011 1942   NITRITE NEGATIVE 06/15/2016 0856   LEUKOCYTESUR NEGATIVE 06/15/2016 0856   Sepsis Labs Invalid input(s): PROCALCITONIN,  WBC,  LACTICIDVEN Microbiology Recent Results  (from the past 240 hour(s))  Blood culture (routine x 2)     Status: None (Preliminary result)   Collection Time: 06/15/16  9:20 AM  Result Value Ref Range Status   Specimen Description BLOOD RIGHT ANTECUBITAL  Final   Special Requests BOTTLES DRAWN AEROBIC ONLY 10CC  Final   Culture NO GROWTH 4 DAYS  Final   Report Status PENDING  Incomplete  Blood culture (routine x 2)     Status: None (Preliminary result)   Collection Time: 06/15/16  9:30 AM  Result Value Ref Range Status   Specimen Description BLOOD RIGHT HAND  Final   Special Requests BOTTLES DRAWN AEROBIC ONLY 10CC  Final   Culture NO GROWTH 4 DAYS  Final   Report Status PENDING  Incomplete  Culture, sputum-assessment     Status: None   Collection Time: 06/16/16  8:58 AM  Result Value Ref Range Status   Specimen Description SPUTUM  Final   Special Requests NONE  Final   Sputum evaluation   Final    Sputum specimen not acceptable for testing.  Please recollect.   Gram Stain Report Called to,Read Back By and Verified With: RN Micheline Rough [phone] 1153AM MLM    Report Status 06/16/2016 FINAL  Final  MRSA PCR Screening     Status: None   Collection Time: 06/16/16  2:11 PM  Result Value Ref Range Status   MRSA by PCR NEGATIVE NEGATIVE Final    Comment:        The GeneXpert MRSA Assay (FDA approved for NASAL specimens only), is one component of a comprehensive MRSA colonization surveillance program. It is not intended to diagnose MRSA infection nor to  guide or monitor treatment for MRSA infections.      Time coordinating discharge: 32 minutes  SIGNED:   Rosita Fire, MD  [facility] 06/20/2016, 12:51 PM  If 7PM-7AM, please contact night-coverage [email or web address] Password [email or web address]

## 2016-06-20 NOTE — Consult Note (Signed)
Physical Medicine and Rehabilitation Consult Reason for Consult: Debilitation/CAP/multi-medical Referring Physician: [facility]   HPI: Jason [name] is a 83 y.o. right handed male with history of PAF maintained on Eliquis, bladder cancer, non-Hodgkin's lymphoma. Per chart review patient lives alone and he has a daughter that checks on him question 24-hour assist. Independent prior to admission with occasional assistive device. One level home with 1 step to entry. Presented 06/15/2016 with generalized weakness question syncope with altered mental status and reported fall. Initially he refused to go to the ED per report of his daughter. MRI reviewed, showing no acute changes. Blood pressure 161/101. Chest x-ray consistent with degree of congestive heart failure. Suspect superimposed pneumonia. MRI cervical spine with no evidence of myelopathy. Troponin 0.04. Urine study negative. Placed on broad-spectrum antibiotics. Venous Doppler studies lower extremities negative for DVT. Neurology consulted for workup of possible syncope. EEG negative for seizure. Bradycardia noted on telemetry with cardiology consulted. Echocardiogram with ejection fraction of 60% and grade 1 diastolic dysfunction. Cardiac status felt to be stable advised to continue Eliquis. Speech therapy follow-up for reported dysphagia currently on a dysphagia #2 thin liquid diet. Physical therapy evaluation completed and ongoing with recommendations of physical medicine rehabilitation consult.   Review of Systems  Constitutional: Negative for chills and fever.  HENT: Positive for hearing loss (Left ear). Negative for tinnitus.   Eyes: Negative for blurred vision and double vision.  Respiratory: Positive for cough. Negative for shortness of breath.        Chronic dysphagia  Cardiovascular: Positive for leg swelling. Negative for chest pain.  Gastrointestinal: Positive for constipation. Negative for nausea.       GERD  Genitourinary:  Positive for frequency. Negative for hematuria.  Musculoskeletal: Positive for falls and myalgias.  Skin: Negative for rash.  Neurological: Positive for weakness. Negative for seizures.  Psychiatric/Behavioral: The patient has insomnia.   All other systems reviewed and are negative.  Past Medical History:  Diagnosis Date  . Arthritis   . Barrett esophagus   . Bladder cancer ([place]) dx'd 2876,8115   surg only  . Colon polyps    adenomatous  . Difficulty sleeping    OCCASIONALLY  . Diverticulosis of colon (without mention of hemorrhage)   . Frequency of urination   . GERD (gastroesophageal reflux disease)   . Hiatal hernia   . History of bladder cancer 1981   EXCISION ONLY  . History of skin cancer    BASAL CELL  . Hyperlipidemia   . Lymphoma, non-Hodgkin's ([place]) 2011   [place]   . Macular degeneration    LEFT EYE  . Multiple nodules of lung 09/16/2011  . PAF (paroxysmal atrial fibrillation) ([place])   . Personal history of colonic polyps 07/15/2010   tubular adenoma  . Prostate cancer [facility])    patient denies  . PUD (peptic ulcer disease)   . Stomach cancer ([place])    [facility]HL origin   Past Surgical History:  Procedure Laterality Date  . APPENDECTOMY    . bone spur     left shoulder  . CHOLECYSTECTOMY    . KNEE ARTHROPLASTY Left 07/30/2015   Procedure: LEFT TOTAL KNEE ARTHROPLASTY WITH COMPUTER NAVIGATION;  Surgeon: Rod Can, MD;  Location: [facility];  Service: Orthopedics;  Laterality: Left;  . LASER OF PROSTATE W/ GREEN LIGHT PVP    . TENDON REPAIR     right arm  . TRIGGER FINGER RELEASE     bilateral hands  .  WRIST SURGERY     Bilateral   Family History  Problem Relation Age of Onset  . Prostate cancer Father   . Bone cancer Brother   . Cancer Brother     back   Social History:  reports that he quit smoking about 49 years ago. He quit smokeless tobacco use about 17 years ago. He reports that he does not drink alcohol or use drugs. Allergies: No Known  Allergies Medications Prior to Admission  Medication Sig Dispense Refill  . apixaban (ELIQUIS) 5 MG TABS tablet Take 5 mg by mouth 2 (two) times daily.    Marland Kitchen esomeprazole (NEXIUM) 40 MG capsule TAKE 1 CAPSULE (40 MG TOTAL) BY MOUTH 2 (TWO) TIMES DAILY BEFORE A MEAL. 60 capsule 6  . fish oil-omega-3 fatty acids 1000 MG capsule Take 1 g by mouth 2 (two) times daily.     Marland Kitchen loratadine (CLARITIN) 10 MG tablet Take 10 mg by mouth daily as needed for allergies.    . Multiple Vitamins-Minerals (ICAPS MV PO) Take by mouth 2 (two) times daily.      Vladimir Faster Glycol-Propyl Glycol (SYSTANE) 0.4-0.3 % SOLN Apply 1 drop to eye daily as needed (dry eyes).    . simvastatin (ZOCOR) 40 MG tablet Take 40 mg by mouth at bedtime.      . tamsulosin (FLOMAX) 0.4 MG CAPS capsule Take 0.4 mg by mouth at bedtime.    . traZODone (DESYREL) 50 MG tablet Take 50 mg by mouth at bedtime as needed for sleep.      Home: Home Living Family/patient expects to be discharged to:: Skilled nursing facility (possibly [facility] ? Kids say pt needs follow up therapy) Living Arrangements: Alone Available Help at Discharge: Family, Available PRN/intermittently Type of Home: House Home Access: Stairs to enter Technical brewer of Steps: 1 Home Layout: One level Bathroom Shower/Tub: Multimedia programmer: Standard Home Equipment: Environmental consultant - 2 wheels, Bedside commode  Functional History: Prior Function Level of Independence: Independent with assistive device(s) Functional Status:  Mobility: Bed Mobility Overal bed mobility: Needs Assistance Bed Mobility: Supine to Sit Supine to sit: Mod assist, HOB elevated General bed mobility comments: pt transferred to sitting on L side of bed with use of bed rails and mod A at trunk with use of bed pads to achieve sitting EOB Transfers Overall transfer level: Needs assistance Equipment used: Rolling walker (2 wheeled) Transfers: Sit to/from Stand Sit to Stand: Min  assist General transfer comment: increased time, VC'ing for bilateral hand placement and min A to rise from bed Ambulation/Gait Ambulation/Gait assistance: Min guard Ambulation Distance (Feet): 75 Feet Assistive device: Rolling walker (2 wheeled) Gait Pattern/deviations: Step-through pattern, Decreased stride length, Trunk flexed General Gait Details: pt required occasional VC'ing to maintain a safe distance from RW, mild instability with no LOB or need for physical assistance, min guard for safety Gait velocity: decreased    ADL: ADL Overall ADL's : Needs assistance/impaired Eating/Feeding: Supervision/ safety, Cueing for safety Grooming: Wash/dry hands, Wash/dry face, Oral care, Min guard Upper Body Bathing: Supervision/ safety, Set up, Sitting Lower Body Bathing: Moderate assistance, Sit to/from stand Upper Body Dressing : Minimal assistance, Sitting Lower Body Dressing: Maximal assistance, Sit to/from stand Toilet Transfer: Minimal assistance, Moderate assistance, Ambulation Toileting- Clothing Manipulation and Hygiene: Moderate assistance Functional mobility during ADLs: Minimal assistance General ADL Comments: Mod a with supien to sit eob for adsl.  S for feeding .  Cognition: Cognition Overall Cognitive Status: Within Functional Limits for tasks assessed  Orientation Level: Oriented X4 Cognition Arousal/Alertness: Awake/alert Behavior During Therapy: WFL for tasks assessed/performed Overall Cognitive Status: Within Functional Limits for tasks assessed  Blood pressure (!) 123/59, pulse 64, temperature 98.2 F (36.8 C), temperature source Axillary, resp. rate 19, height 5\' 10"  (1.778 m), weight 105.8 kg (233 lb 4 oz), SpO2 91 %. Physical Exam  Vitals reviewed. Constitutional: He is oriented to person, place, and time. He appears well-developed.  Obese  HENT:  Head: Normocephalic and atraumatic.  Eyes: Conjunctivae and EOM are normal.  Neck: Normal range of motion. Neck  supple. No thyromegaly present.  Cardiovascular: Normal rate and regular rhythm.   Cardiac rate controlled  Respiratory: Effort normal.  Decreased breath sounds at the bases.  GI: Soft. Bowel sounds are normal.  Musculoskeletal: He exhibits no edema or tenderness.  Neurological: He is alert and oriented to person, place, and time.  He makes good eye contact with examiner.  Follows simple commands.  HOH Motor: 4+/5 grossly throughout Sensation intact to light touch DTRs symmetric  Skin: Skin is warm and dry.  Psychiatric: He has a normal mood and affect. His behavior is normal.    Results for orders placed or performed during the hospital encounter of 06/15/16 (from the past 24 hour(s))  Glucose, capillary     Status: Abnormal   Collection Time: 06/19/16  7:34 AM  Result Value Ref Range   Glucose-Capillary 104 (H) 65 - 99 mg/dL  Glucose, capillary     Status: Abnormal   Collection Time: 06/19/16 12:04 PM  Result Value Ref Range   Glucose-Capillary 115 (H) 65 - 99 mg/dL  Glucose, capillary     Status: Abnormal   Collection Time: 06/19/16  3:54 PM  Result Value Ref Range   Glucose-Capillary 131 (H) 65 - 99 mg/dL  Glucose, capillary     Status: Abnormal   Collection Time: 06/19/16  9:39 PM  Result Value Ref Range   Glucose-Capillary 130 (H) 65 - 99 mg/dL   No results found.  Assessment/Plan: Diagnosis: Debilitation Labs and images independently reviewed.  Records reviewed and summated above.  1. Does the need for close, 24 hr/day medical supervision in concert with the patient's rehab needs make it unreasonable for this patient to be served in a less intensive setting? Potentially 2. Co-Morbidities requiring supervision/potential complications: PAF (cont meds, monitor HR with increased physical activity), bladder cancer, non-Hodgkin's lymphoma, pneumonia (cont abx), Bradycardia (monitor HR with increased activity, recs per Cards), diastolic dysfunction (monitor for  signs/symptoms of fluid overload), dysphagia (advance diet as tolerated), tachypnea (monitor RR and O2 Sats with increased physical exertion), hyperglycemia (Monitor in accordance with exercise and adjust meds as necessary), CKD (avoid nephrotoxic meds), ABLA (transfuse if necessary to ensure appropriate perfusion for increased activity tolerance), leukocytosis (cont to monitor for signs and symptoms of infection, further workup if indicated) 3. Due to safety, disease management and patient education, does the patient require 24 hr/day rehab nursing? Yes 4. Does the patient require coordinated care of a physician, rehab nurse, PT (1-2 hrs/day, 5 days/week) and OT (1-2 hrs/day, 5 days/week) to address physical and functional deficits in the context of the above medical diagnosis(es)? Potentially Addressing deficits in the following areas: endurance, locomotion, bathing, dressing, toileting and psychosocial support 5. Can the patient actively participate in an intensive therapy program of at least 3 hrs of therapy per day at least 5 days per week? Yes 6. The potential for patient to make measurable gains while on inpatient rehab is excellent 7.  Anticipated functional outcomes upon discharge from inpatient rehab are modified independent  with PT, modified independent with OT, n/a with SLP. 8. Estimated rehab length of stay to reach the above functional goals is: 5-7 days. 9. Does the patient have adequate social supports and living environment to accommodate these discharge functional goals? Yes 10. Anticipated D/C setting: Home 11. Anticipated post D/C treatments: HH therapy and Home excercise program 12. Overall Rehab/Functional Prognosis: excellent  RECOMMENDATIONS: This patient's condition is appropriate for continued rehabilitative care in the following setting: Pt doing well with therapies with significant improvement in functionality. Request reeval by PT today, if pt has persistent deficits  recommend CIR, however, pt may be progressing towards Mod I. Patient has agreed to participate in recommended program. Yes Note that insurance prior authorization may be required for reimbursement for recommended care.  Comment: Rehab Admissions Coordinator to follow up.  Delice Lesch, MD, Mellody Drown Cathlyn Parsons., PA-C 06/20/2016

## 2016-06-20 NOTE — Progress Notes (Signed)
PROGRESS NOTE    Jason [name]  JXB:[id number] DOB: [birth year] DOA: 06/15/2016 PCP: Melinda Crutch, MD   Brief Narrative:  83 y.o. male with medical history significant for Paroxysmal AFib on eliquis, GERD, Barrett's esophagus, bladder CA, non-Hodgkin's lymphoma, OA, hyperlipidemia who presented to the ED with c/o generalized weakness and fall. Chest x-ray showed possible CHF with superimposed pneumonia in the middle left lung base Head CT showed no acute abnormalities, small vessel ischemic disease white matter changes, and ethmoidal paranasal sinus disease.  Assessment & Plan:   # Community acquired pneumonia: Patient is clinically improving. Continue ceftriaxone and azithromycin till 3/21 to complete the course. Patient is not hypoxic. Clinically improved.   #Paroxysmal atrial fibrillation:continue Eliquis. Monitor in telemetry. Evaluated by cardiologist regarding possible bradycardia during the episode of unresponsiveness. Cardiologist did not think that the syncope is caused by cardiac etiology. Continue to monitor for now.  #Brief episodes of unresponsiveness on 3/15: Exact etiology unknown. As per patient's daughter, patient was suddenly blanking out and became unresponsive. There was some movement of his upper body but the daughter was not able to specifically explained.  -Neuro consult appreciated, received dose mestinon for ? Myasthenia Gravis (ruled out), however with no clinical improvement, it was discontinued. EEG with no seizure. Neurologist recommended to follow-up MG panel as outpatient as this test will take some time to be resulted. I discussed this with the patient's daughter. -As per neurologist,patient will need an EMG/NCS at an outpatient neurology office. Patient is to follow-up with neurologist on discharge. -echocardiogram EF of 55-60% and LV hypertrophy. -Frequent neuro check and seizure precautions.  #Hyperlipidemia: Continue statin  #History of GERD: Continue  PPI  # History of BPH: Continue Flomax and monitor.  #Fall, generalized weakness and physical deconditioning: PT, OT and social worker evaluation. Patient likely needs rehabilitation or SNF on discharge. Swallow evaluation ongoing. Patient is on dysphagia level III diet. Continue to monitor closely. CIR and social worker consulted for possible discharge to rehabilitation.   I discussed with the social worker regarding discharge planning. Also waiting for inpatient rehabilitation consult and their evaluation for discharge planning. Patient is to go either inpatient rehabilitation or skilled nursing facility. I discussed this with the patient and her daughter at bedside as well. Meantime I'll continue current medical and supportive treatment.  Principal Problem:   CAP (community acquired pneumonia) Active Problems:   Hyperlipidemia   GERD (gastroesophageal reflux disease)   BPH (benign prostatic hyperplasia)   PAF (paroxysmal atrial fibrillation) ([facility])   Pressure injury of skin, Unspecified.   Lobar pneumonia ([place])    DVT prophylaxis:Systemic anticoagulation  Code Status:Full code  Family Communication: Discussed with the patient's daughter at bedside Disposition Plan:Likely discharge to rehabilitation/SNF in 1-2 days    Consultants:   Neurologist  Cardiologist  Procedures:None  Antimicrobials:Azithromycin and ceftriaxone since 3/14  Subjective: Patient was seen and examined at bedtime. No new event. Denied chest pain, shortness of breath, headache, nausea or vomiting. Objective: Vitals:   06/20/16 0400 06/20/16 0500 06/20/16 0600 06/20/16 0700  BP: 139/60 (!) 123/59 136/64 (!) 126/94  Pulse: 68 64 65 (!) 56  Resp: 16 19 (!) 21 15  Temp:    97.3 F (36.3 C)  TempSrc:    Oral  SpO2: 94% 91% 95% 98%  Weight:  105.8 kg (233 lb 4 oz)    Height:        Intake/Output Summary (Last 24 hours) at 06/20/16 1132 Last data filed at 06/20/16 0952  Gross per  24 hour  Intake               255 ml  Output              375 ml  Net             -120 ml   Filed Weights   06/18/16 0413 06/19/16 0500 06/20/16 0500  Weight: 107 kg (235 lb 14.3 oz) 107 kg (235 lb 14.3 oz) 105.8 kg (233 lb 4 oz)    Examination:  General exam: Not in distress, sitting on chair comfortable Respiratory system: Clear bilateral. Respiratory effort normal. No wheezing or crackle Cardiovascular system: Regular rate and rhythm, S1-S2 normal. No pedal edema. Gastrointestinal system: Abdomen is nondistended, soft and nontender. Normal bowel sounds heard. Central nervous system: Alert awake and oriented. No focal neurological deficit Extremities: Symmetric 5 x 5 power. Skin: No rashes, lesions or ulcers Psychiatry: Judgement and insight appear normal and age appropriate. Mood & affect appropriate.     Data Reviewed: I have personally reviewed following labs and imaging studies  CBC:  Recent Labs Lab 06/15/16 0726 06/16/16 1245 06/18/16 0346  WBC 8.6 10.5 11.6*  NEUTROABS 5.4  --   --   HGB 12.0* 12.8* 12.0*  HCT 37.4* 39.7 37.5*  MCV 85.0 84.3 84.7  PLT 173 182 902   Basic Metabolic Panel:  Recent Labs Lab 06/15/16 0726 06/15/16 1052 06/16/16 1245 06/16/16 1549 06/18/16 0346  NA 138  --  135  --  136  K 3.8  --  3.8  --  3.5  CL 105  --  104  --  105  CO2 22  --  21*  --  22  GLUCOSE 102*  --  121*  --  106*  BUN 20  --  19  --  28*  CREATININE 1.06  --  1.12  --  1.17  CALCIUM 9.0  --  8.7*  --  8.8*  MG  --  2.1 1.9 2.1  --    GFR: Estimated Creatinine Clearance: 55.2 mL/min (by C-G formula based on SCr of 1.17 mg/dL). Liver Function Tests:  Recent Labs Lab 06/15/16 0726  AST 19  ALT 11*  ALKPHOS 31*  BILITOT 0.7  PROT 6.6  ALBUMIN 3.3*   No results for input(s): LIPASE, AMYLASE in the last 168 hours.  Recent Labs Lab 06/16/16 1549  AMMONIA 27   Coagulation Profile: No results for input(s): INR, PROTIME in the last 168 hours. Cardiac  Enzymes:  Recent Labs Lab 06/15/16 0726 06/16/16 1245 06/16/16 2032  CKTOTAL  --   --  125  TROPONINI 0.04* 0.04*  --    BNP (last 3 results) No results for input(s): PROBNP in the last 8760 hours. HbA1C: No results for input(s): HGBA1C in the last 72 hours. CBG:  Recent Labs Lab 06/19/16 0734 06/19/16 1204 06/19/16 1554 06/19/16 2139 06/20/16 0747  GLUCAP 104* 115* 131* 130* 97   Lipid Profile: No results for input(s): CHOL, HDL, LDLCALC, TRIG, CHOLHDL, LDLDIRECT in the last 72 hours. Thyroid Function Tests: No results for input(s): TSH, T4TOTAL, FREET4, T3FREE, THYROIDAB in the last 72 hours. Anemia Panel: No results for input(s): VITAMINB12, FOLATE, FERRITIN, TIBC, IRON, RETICCTPCT in the last 72 hours. Sepsis Labs:  Recent Labs Lab 06/15/16 1052 06/17/16 0508 06/19/16 0419  PROCALCITON <0.10 <0.10 <0.10    Recent Results (from the past 240 hour(s))  Blood culture (routine x 2)     Status: None (Preliminary  result)   Collection Time: 06/15/16  9:20 AM  Result Value Ref Range Status   Specimen Description BLOOD RIGHT ANTECUBITAL  Final   Special Requests BOTTLES DRAWN AEROBIC ONLY 10CC  Final   Culture NO GROWTH 4 DAYS  Final   Report Status PENDING  Incomplete  Blood culture (routine x 2)     Status: None (Preliminary result)   Collection Time: 06/15/16  9:30 AM  Result Value Ref Range Status   Specimen Description BLOOD RIGHT HAND  Final   Special Requests BOTTLES DRAWN AEROBIC ONLY 10CC  Final   Culture NO GROWTH 4 DAYS  Final   Report Status PENDING  Incomplete  Culture, sputum-assessment     Status: None   Collection Time: 06/16/16  8:58 AM  Result Value Ref Range Status   Specimen Description SPUTUM  Final   Special Requests NONE  Final   Sputum evaluation   Final    Sputum specimen not acceptable for testing.  Please recollect.   Gram Stain Report Called to,Read Back By and Verified With: RN Micheline Rough [phone] [id number] MLM    Report Status  06/16/2016 FINAL  Final  MRSA PCR Screening     Status: None   Collection Time: 06/16/16  2:11 PM  Result Value Ref Range Status   MRSA by PCR NEGATIVE NEGATIVE Final    Comment:        The GeneXpert MRSA Assay (FDA approved for NASAL specimens only), is one component of a comprehensive MRSA colonization surveillance program. It is not intended to diagnose MRSA infection nor to guide or monitor treatment for MRSA infections.          Radiology Studies: No results found.      Scheduled Meds: . apixaban  5 mg Oral BID  . azithromycin  500 mg Oral Daily  . cefTRIAXone (ROCEPHIN)  IV  1 g Intravenous Q24H  . docusate sodium  100 mg Oral BID  . omega-3 acid ethyl esters  1 g Oral BID  . pantoprazole  80 mg Oral Q1200  . senna  1 tablet Oral BID  . simvastatin  40 mg Oral QHS  . sodium chloride flush  3 mL Intravenous Q12H  . tamsulosin  0.4 mg Oral QHS   Continuous Infusions:    LOS: 4 days    Jason Coin Tanna Furry, MD [facility] Pager [phone]  If 7PM-7AM, please contact night-coverage [email or web address] Password [facility] 06/20/2016, 11:32 AM

## 2016-06-20 NOTE — Plan of Care (Signed)
Problem: Activity: Goal: Risk for activity intolerance will decrease Outcome: Progressing Patient worked with PT and requests to get up to chair during day. Patient was up to chair twice on 3/18. Patient requests to be OOB to chair daily.

## 2016-06-22 ENCOUNTER — Ambulatory Visit: Payer: BLUE CROSS/BLUE SHIELD | Admitting: Neurology

## 2016-06-28 LAB — MISC LABCORP TEST (SEND OUT): LABCORP TEST CODE: 9985

## 2016-06-30 ENCOUNTER — Inpatient Hospital Stay (HOSPITAL_COMMUNITY)
Admission: EM | Admit: 2016-06-30 | Discharge: 2016-07-07 | DRG: 065 | Disposition: A | Discharge status: Skilled Nursing Facility | Payer: Medicare Other | Attending: Internal Medicine | Admitting: Internal Medicine

## 2016-06-30 ENCOUNTER — Other Ambulatory Visit: Payer: Self-pay

## 2016-06-30 ENCOUNTER — Emergency Department (HOSPITAL_COMMUNITY): Payer: Medicare Other

## 2016-06-30 ENCOUNTER — Encounter (HOSPITAL_COMMUNITY): Payer: Self-pay

## 2016-06-30 DIAGNOSIS — G459 Transient cerebral ischemic attack, unspecified: Secondary | ICD-10-CM | POA: Insufficient documentation

## 2016-06-30 DIAGNOSIS — Z96652 Presence of left artificial knee joint: Secondary | ICD-10-CM | POA: Diagnosis present

## 2016-06-30 DIAGNOSIS — G8191 Hemiplegia, unspecified affecting right dominant side: Secondary | ICD-10-CM | POA: Diagnosis present

## 2016-06-30 DIAGNOSIS — Z859 Personal history of malignant neoplasm, unspecified: Secondary | ICD-10-CM

## 2016-06-30 DIAGNOSIS — R05 Cough: Secondary | ICD-10-CM | POA: Diagnosis not present

## 2016-06-30 DIAGNOSIS — R1310 Dysphagia, unspecified: Secondary | ICD-10-CM | POA: Diagnosis not present

## 2016-06-30 DIAGNOSIS — D649 Anemia, unspecified: Secondary | ICD-10-CM | POA: Diagnosis present

## 2016-06-30 DIAGNOSIS — Z9049 Acquired absence of other specified parts of digestive tract: Secondary | ICD-10-CM

## 2016-06-30 DIAGNOSIS — E785 Hyperlipidemia, unspecified: Secondary | ICD-10-CM | POA: Diagnosis present

## 2016-06-30 DIAGNOSIS — Z85828 Personal history of other malignant neoplasm of skin: Secondary | ICD-10-CM

## 2016-06-30 DIAGNOSIS — Z4659 Encounter for fitting and adjustment of other gastrointestinal appliance and device: Secondary | ICD-10-CM

## 2016-06-30 DIAGNOSIS — E669 Obesity, unspecified: Secondary | ICD-10-CM | POA: Diagnosis present

## 2016-06-30 DIAGNOSIS — I639 Cerebral infarction, unspecified: Secondary | ICD-10-CM | POA: Diagnosis not present

## 2016-06-30 DIAGNOSIS — R4781 Slurred speech: Secondary | ICD-10-CM | POA: Diagnosis not present

## 2016-06-30 DIAGNOSIS — R29810 Facial weakness: Secondary | ICD-10-CM

## 2016-06-30 DIAGNOSIS — I480 Paroxysmal atrial fibrillation: Secondary | ICD-10-CM | POA: Diagnosis present

## 2016-06-30 DIAGNOSIS — R471 Dysarthria and anarthria: Secondary | ICD-10-CM

## 2016-06-30 DIAGNOSIS — Z8701 Personal history of pneumonia (recurrent): Secondary | ICD-10-CM

## 2016-06-30 DIAGNOSIS — Z6831 Body mass index (BMI) 31.0-31.9, adult: Secondary | ICD-10-CM

## 2016-06-30 DIAGNOSIS — Z86010 Personal history of colonic polyps: Secondary | ICD-10-CM

## 2016-06-30 DIAGNOSIS — E876 Hypokalemia: Secondary | ICD-10-CM | POA: Diagnosis present

## 2016-06-30 DIAGNOSIS — Z7901 Long term (current) use of anticoagulants: Secondary | ICD-10-CM

## 2016-06-30 DIAGNOSIS — H3530 Unspecified macular degeneration: Secondary | ICD-10-CM | POA: Diagnosis present

## 2016-06-30 DIAGNOSIS — Z85028 Personal history of other malignant neoplasm of stomach: Secondary | ICD-10-CM

## 2016-06-30 DIAGNOSIS — Z8711 Personal history of peptic ulcer disease: Secondary | ICD-10-CM

## 2016-06-30 DIAGNOSIS — Z8042 Family history of malignant neoplasm of prostate: Secondary | ICD-10-CM

## 2016-06-30 DIAGNOSIS — Z87891 Personal history of nicotine dependence: Secondary | ICD-10-CM

## 2016-06-30 DIAGNOSIS — R4701 Aphasia: Secondary | ICD-10-CM | POA: Diagnosis present

## 2016-06-30 DIAGNOSIS — I739 Peripheral vascular disease, unspecified: Secondary | ICD-10-CM | POA: Diagnosis present

## 2016-06-30 DIAGNOSIS — E784 Other hyperlipidemia: Secondary | ICD-10-CM

## 2016-06-30 DIAGNOSIS — R8290 Unspecified abnormal findings in urine: Secondary | ICD-10-CM | POA: Diagnosis present

## 2016-06-30 DIAGNOSIS — Z8546 Personal history of malignant neoplasm of prostate: Secondary | ICD-10-CM

## 2016-06-30 DIAGNOSIS — Z79899 Other long term (current) drug therapy: Secondary | ICD-10-CM

## 2016-06-30 DIAGNOSIS — N400 Enlarged prostate without lower urinary tract symptoms: Secondary | ICD-10-CM | POA: Diagnosis present

## 2016-06-30 DIAGNOSIS — Z8551 Personal history of malignant neoplasm of bladder: Secondary | ICD-10-CM

## 2016-06-30 DIAGNOSIS — G8190 Hemiplegia, unspecified affecting unspecified side: Secondary | ICD-10-CM

## 2016-06-30 DIAGNOSIS — Z808 Family history of malignant neoplasm of other organs or systems: Secondary | ICD-10-CM

## 2016-06-30 DIAGNOSIS — K2270 Barrett's esophagus without dysplasia: Secondary | ICD-10-CM | POA: Diagnosis present

## 2016-06-30 DIAGNOSIS — I10 Essential (primary) hypertension: Secondary | ICD-10-CM | POA: Diagnosis present

## 2016-06-30 DIAGNOSIS — K219 Gastro-esophageal reflux disease without esophagitis: Secondary | ICD-10-CM | POA: Diagnosis present

## 2016-06-30 DIAGNOSIS — Z8572 Personal history of non-Hodgkin lymphomas: Secondary | ICD-10-CM

## 2016-06-30 DIAGNOSIS — I454 Nonspecific intraventricular block: Secondary | ICD-10-CM | POA: Diagnosis present

## 2016-06-30 LAB — CBC
HCT: 38.3 % — ABNORMAL LOW (ref 39.0–52.0)
Hemoglobin: 12.1 g/dL — ABNORMAL LOW (ref 13.0–17.0)
MCH: 27 pg (ref 26.0–34.0)
MCHC: 31.6 g/dL (ref 30.0–36.0)
MCV: 85.5 fL (ref 78.0–100.0)
PLATELETS: 194 10*3/uL (ref 150–400)
RBC: 4.48 MIL/uL (ref 4.22–5.81)
RDW: 13.5 % (ref 11.5–15.5)
WBC: 8.2 10*3/uL (ref 4.0–10.5)

## 2016-06-30 LAB — GLUCOSE, CAPILLARY: Glucose-Capillary: 90 mg/dL (ref 65–99)

## 2016-06-30 LAB — URINALYSIS, ROUTINE W REFLEX MICROSCOPIC
Bacteria, UA: NONE SEEN
Bilirubin Urine: NEGATIVE
GLUCOSE, UA: NEGATIVE mg/dL
HGB URINE DIPSTICK: NEGATIVE
Ketones, ur: 20 mg/dL — AB
Nitrite: NEGATIVE
PROTEIN: NEGATIVE mg/dL
Specific Gravity, Urine: 1.039 — ABNORMAL HIGH (ref 1.005–1.030)
pH: 5 (ref 5.0–8.0)

## 2016-06-30 LAB — I-STAT CHEM 8, ED
BUN: 30 mg/dL — ABNORMAL HIGH (ref 6–20)
CALCIUM ION: 1.2 mmol/L (ref 1.15–1.40)
Chloride: 105 mmol/L (ref 101–111)
Creatinine, Ser: 1.1 mg/dL (ref 0.61–1.24)
Glucose, Bld: 98 mg/dL (ref 65–99)
HEMATOCRIT: 38 % — AB (ref 39.0–52.0)
Hemoglobin: 12.9 g/dL — ABNORMAL LOW (ref 13.0–17.0)
Potassium: 3.8 mmol/L (ref 3.5–5.1)
SODIUM: 142 mmol/L (ref 135–145)
TCO2: 25 mmol/L (ref 0–100)

## 2016-06-30 LAB — COMPREHENSIVE METABOLIC PANEL
ALBUMIN: 3.3 g/dL — AB (ref 3.5–5.0)
ALT: 12 U/L — ABNORMAL LOW (ref 17–63)
ANION GAP: 9 (ref 5–15)
AST: 23 U/L (ref 15–41)
Alkaline Phosphatase: 30 U/L — ABNORMAL LOW (ref 38–126)
BILIRUBIN TOTAL: 0.8 mg/dL (ref 0.3–1.2)
BUN: 28 mg/dL — ABNORMAL HIGH (ref 6–20)
CO2: 25 mmol/L (ref 22–32)
Calcium: 9.1 mg/dL (ref 8.9–10.3)
Chloride: 106 mmol/L (ref 101–111)
Creatinine, Ser: 1.21 mg/dL (ref 0.61–1.24)
GFR calc Af Amer: >60 mL/min (ref >60)
GFR calc non Af Amer: 52 mL/min — ABNORMAL LOW (ref >60)
GLUCOSE: 103 mg/dL — AB (ref 65–99)
POTASSIUM: 3.9 mmol/L (ref 3.5–5.1)
SODIUM: 140 mmol/L (ref 135–145)
TOTAL PROTEIN: 6.5 g/dL (ref 6.5–8.1)

## 2016-06-30 LAB — DIFFERENTIAL
BASOS PCT: 0 %
Basophils Absolute: 0 10*3/uL (ref 0.0–0.1)
EOS PCT: 1 %
Eosinophils Absolute: 0.1 10*3/uL (ref 0.0–0.7)
LYMPHS ABS: 1.7 10*3/uL (ref 0.7–4.0)
Lymphocytes Relative: 21 %
Monocytes Absolute: 0.5 10*3/uL (ref 0.1–1.0)
Monocytes Relative: 6 %
NEUTROS PCT: 72 %
Neutro Abs: 5.8 10*3/uL (ref 1.7–7.7)

## 2016-06-30 LAB — PROTIME-INR
INR: 1.58
PROTHROMBIN TIME: 19 s — AB (ref 11.4–15.2)

## 2016-06-30 LAB — I-STAT TROPONIN, ED: Troponin i, poc: 0.04 ng/mL (ref 0.00–0.08)

## 2016-06-30 LAB — APTT: aPTT: 51 seconds — ABNORMAL HIGH (ref 24–36)

## 2016-06-30 MED ORDER — ACETAMINOPHEN 160 MG/5ML PO SOLN: 650.0000 mg | ORAL | Status: DC | PRN

## 2016-06-30 MED ORDER — LORATADINE 10 MG PO TABS
10.0000 mg | ORAL_TABLET | Freq: Every day | ORAL | Status: DC | PRN
Start: 2016-06-30 — End: 2016-07-07

## 2016-06-30 MED ORDER — ASPIRIN 325 MG PO TABS: 325.0000 mg | ORAL_TABLET | Freq: Every day | ORAL | Status: DC

## 2016-06-30 MED ORDER — SENNOSIDES-DOCUSATE SODIUM 8.6-50 MG PO TABS
1.0000 | ORAL_TABLET | Freq: Every evening | ORAL | Status: DC | PRN
  Administered 2016-07-03: 1 via ORAL
  Filled 2016-06-30: qty 1

## 2016-06-30 MED ORDER — TRAZODONE HCL 50 MG PO TABS
50.0000 mg | ORAL_TABLET | Freq: Every evening | ORAL | Status: DC | PRN
  Administered 2016-07-04: 50 mg via ORAL
  Filled 2016-06-30: qty 1

## 2016-06-30 MED ORDER — PANTOPRAZOLE SODIUM 40 MG PO TBEC
40.0000 mg | DELAYED_RELEASE_TABLET | Freq: Two times a day (BID) | ORAL | Status: DC
  Administered 2016-06-30 – 2016-07-07 (×7): 40 mg via ORAL
  Filled 2016-06-30 (×9): qty 1

## 2016-06-30 MED ORDER — TAMSULOSIN HCL 0.4 MG PO CAPS
0.4000 mg | ORAL_CAPSULE | Freq: Every day | ORAL | Status: DC
  Administered 2016-06-30 – 2016-07-06 (×4): 0.4 mg via ORAL
  Filled 2016-06-30 (×5): qty 1

## 2016-06-30 MED ORDER — POLYVINYL ALCOHOL 1.4 % OP SOLN
1.0000 [drp] | Freq: Every day | OPHTHALMIC | Status: DC | PRN
  Administered 2016-07-04: 1 [drp] via OPHTHALMIC
  Filled 2016-06-30: qty 15

## 2016-06-30 MED ORDER — SIMVASTATIN 40 MG PO TABS
40.0000 mg | ORAL_TABLET | Freq: Every day | ORAL | Status: DC
  Administered 2016-06-30 – 2016-07-06 (×4): 40 mg via ORAL
  Filled 2016-06-30 (×5): qty 1

## 2016-06-30 MED ORDER — SODIUM CHLORIDE 0.9 % IV SOLN
INTRAVENOUS | Status: DC
  Administered 2016-07-02 (×2): via INTRAVENOUS
  Administered 2016-07-04: 75 mL via INTRAVENOUS

## 2016-06-30 MED ORDER — STROKE: EARLY STAGES OF RECOVERY BOOK
Freq: Once | Status: AC
  Administered 2016-07-01 (×2)
  Filled 2016-06-30: qty 1

## 2016-06-30 MED ORDER — ACETAMINOPHEN 650 MG RE SUPP: 650.0000 mg | RECTAL | Status: DC | PRN

## 2016-06-30 MED ORDER — ASPIRIN 325 MG PO TABS
325.0000 mg | ORAL_TABLET | Freq: Every day | ORAL | Status: DC
Start: 2016-07-01 — End: 2016-07-01

## 2016-06-30 MED ORDER — IPRATROPIUM-ALBUTEROL 0.5-2.5 (3) MG/3ML IN SOLN: 3.0000 mL | RESPIRATORY_TRACT | Status: DC | PRN

## 2016-06-30 MED ORDER — ACETAMINOPHEN 325 MG PO TABS: 650.0000 mg | ORAL_TABLET | ORAL | Status: DC | PRN

## 2016-06-30 MED ORDER — APIXABAN 5 MG PO TABS
5.0000 mg | ORAL_TABLET | Freq: Two times a day (BID) | ORAL | Status: DC
  Administered 2016-06-30: 5 mg via ORAL
  Filled 2016-06-30: qty 1

## 2016-06-30 MED ORDER — GUAIFENESIN ER 600 MG PO TB12
600.0000 mg | ORAL_TABLET | Freq: Two times a day (BID) | ORAL | Status: DC
  Administered 2016-06-30 – 2016-07-07 (×7): 600 mg via ORAL
  Filled 2016-06-30 (×8): qty 1

## 2016-06-30 NOTE — ED Triage Notes (Signed)
Pt BIB [facility] from [facility] where pt has been staying for rehab since last hospital admission. EMS reports that daughter noticed facial droop in pt X2 days ago with slurred speech that "comes and goes". Faculty noticed pt having difficulty ambulating today at 1000 after therapy. Per staff, pt sat in chair and was unable to get up. Pt reports he felt like he had lead in his legs. Staff reported right sided facial droop, drooling, and being unable to speak. EMS reports that pt would have intermittent episodes of slurring during transport. Pt presents A&O denies pain and states he feels that he is a little better.

## 2016-06-30 NOTE — ED Provider Notes (Signed)
[facility] DEPT Provider Note   CSN: [id number] Arrival date & time: 06/30/16  1347     History   Chief Complaint Chief Complaint  Patient presents with  . Aphasia    HPI Jason [name] is a 83 y.o. male.  The history is provided by the patient, a relative and medical records. No language interpreter was used.  Neurologic Problem  This is a new problem. The current episode started 2 days ago. The problem occurs constantly. The problem has been gradually improving. Pertinent negatives include no chest pain, no abdominal pain, no headaches and no shortness of breath. Nothing aggravates the symptoms. Nothing relieves the symptoms. He has tried nothing for the symptoms. The treatment provided no relief.  Cough  This is a recurrent problem. The current episode started more than 1 week ago. The problem occurs constantly. The problem has not changed since onset.The cough is productive of sputum. Associated symptoms include chills. Pertinent negatives include no chest pain, no headaches, no shortness of breath and no wheezing. He has tried nothing for the symptoms.    Past Medical History:  Diagnosis Date  . Arthritis   . Barrett esophagus   . Bladder cancer ([place]) dx'd 6073,7106   surg only  . Colon polyps    adenomatous  . Difficulty sleeping    OCCASIONALLY  . Diverticulosis of colon (without mention of hemorrhage)   . Frequency of urination   . GERD (gastroesophageal reflux disease)   . Hiatal hernia   . History of bladder cancer 1981   EXCISION ONLY  . History of skin cancer    BASAL CELL  . Hyperlipidemia   . Lymphoma, non-Hodgkin's ([place]) 2011   [place]   . Macular degeneration    LEFT EYE  . Multiple nodules of lung 09/16/2011  . PAF (paroxysmal atrial fibrillation) ([place])   . Personal history of colonic polyps 07/15/2010   tubular adenoma  . Prostate cancer [facility])    patient denies  . PUD (peptic ulcer disease)   . Stomach cancer ([place])    NHL origin     Patient Active Problem List   Diagnosis Date Noted  . History of bladder cancer   . Dysphagia   . Hyperglycemia   . Stage 3 chronic kidney disease   . Acute blood loss anemia   . Leukocytosis   . Weakness   . Community acquired pneumonia 06/16/2016  . Unresponsiveness   . CAP (community acquired pneumonia) 06/15/2016  . PAF (paroxysmal atrial fibrillation) ([place]) 06/15/2016  . Pressure injury of skin 06/15/2016  . Lobar pneumonia ([place])   . Acute congestive heart failure ([place])   . Primary osteoarthritis of left knee 07/30/2015  . BPH (benign prostatic hyperplasia) 07/16/2012  . Multiple nodules of lung 09/16/2011  . GERD (gastroesophageal reflux disease) 06/14/2011  . Constipation, chronic 06/14/2011  . NHL (non-Hodgkin's lymphoma) ([place]) 01/03/2010  . Malignant neoplasm of bladder ([place]) 05/28/2007  . Hyperlipidemia 05/28/2007  . HIATAL HERNIA 05/28/2007  . BARRETT'S ESOPHAGUS, HX OF 05/28/2007    Past Surgical History:  Procedure Laterality Date  . APPENDECTOMY    . bone spur     left shoulder  . CHOLECYSTECTOMY    . KNEE ARTHROPLASTY Left 07/30/2015   Procedure: LEFT TOTAL KNEE ARTHROPLASTY WITH COMPUTER NAVIGATION;  Surgeon: Rod Can, MD;  Location: [facility];  Service: Orthopedics;  Laterality: Left;  . LASER OF PROSTATE W/ GREEN LIGHT PVP    . TENDON REPAIR  right arm  . TRIGGER FINGER RELEASE     bilateral hands  . WRIST SURGERY     Bilateral       Home Medications    Prior to Admission medications   Medication Sig Start Date End Date Taking? Authorizing Provider  apixaban (ELIQUIS) 5 MG TABS tablet Take 5 mg by mouth 2 (two) times daily.    Historical Provider, MD  esomeprazole (NEXIUM) 40 MG capsule TAKE 1 CAPSULE (40 MG TOTAL) BY MOUTH 2 (TWO) TIMES DAILY BEFORE A MEAL. 03/17/16   Irene Shipper, MD  fish oil-omega-3 fatty acids 1000 MG capsule Take 1 g by mouth 2 (two) times daily.     Historical Provider, MD  loratadine (CLARITIN) 10 MG tablet  Take 10 mg by mouth daily as needed for allergies.    Historical Provider, MD  Multiple Vitamins-Minerals (ICAPS MV PO) Take by mouth 2 (two) times daily.      Historical Provider, MD  Polyethyl Glycol-Propyl Glycol (SYSTANE) 0.4-0.3 % SOLN Apply 1 drop to eye daily as needed (dry eyes).    Historical Provider, MD  simvastatin (ZOCOR) 40 MG tablet Take 40 mg by mouth at bedtime.      Historical Provider, MD  tamsulosin (FLOMAX) 0.4 MG CAPS capsule Take 0.4 mg by mouth at bedtime.    Historical Provider, MD  traZODone (DESYREL) 50 MG tablet Take 50 mg by mouth at bedtime as needed for sleep.    Historical Provider, MD    Family History Family History  Problem Relation Age of Onset  . Prostate cancer Father   . Bone cancer Brother   . Cancer Brother     back    Social History Social History  Substance Use Topics  . Smoking status: Former Smoker    Quit date: 10/29/1966  . Smokeless tobacco: Former Systems developer    Quit date: 06/29/1998  . Alcohol use No     Allergies   Patient has no known allergies.   Review of Systems Review of Systems  Constitutional: Positive for chills. Negative for fatigue and fever.  HENT: Positive for congestion.   Respiratory: Positive for cough. Negative for chest tightness, shortness of breath, wheezing and stridor.   Cardiovascular: Negative for chest pain, palpitations and leg swelling.  Gastrointestinal: Negative for abdominal pain, diarrhea and nausea.  Genitourinary: Negative for dysuria and flank pain.  Musculoskeletal: Negative for back pain, neck pain and neck stiffness.  Skin: Negative for rash and wound.  Neurological: Positive for facial asymmetry, speech difficulty and weakness. Negative for light-headedness, numbness and headaches.  Psychiatric/Behavioral: Negative for agitation.  All other systems reviewed and are negative.    Physical Exam Updated Vital Signs BP (!) 129/55 (BP Location: Right Arm)   Pulse 66   Temp 98.4 F (36.9 C)  (Oral)   Resp 20   Ht 5\' 10"  (1.778 m)   Wt 233 lb (105.7 kg)   SpO2 100%   BMI 33.43 kg/m   Physical Exam  Constitutional: He is oriented to person, place, and time. He appears well-developed and well-nourished. No distress.  HENT:  Head: Normocephalic and atraumatic.  Right Ear: External ear normal.  Left Ear: External ear normal.  Nose: Nose normal.  Mouth/Throat: Oropharynx is clear and moist. No oropharyngeal exudate.  Eyes: Conjunctivae and EOM are normal. Pupils are equal, round, and reactive to light. No scleral icterus.  Neck: Normal range of motion. Neck supple.  Cardiovascular: Normal rate, normal heart sounds and intact distal pulses.  No murmur heard. Pulmonary/Chest: Effort normal. No stridor. No respiratory distress. He has no wheezes. He exhibits no tenderness.  Abdominal: Soft. There is no tenderness. There is no rebound and no guarding.  Musculoskeletal: He exhibits no edema or tenderness.  Neurological: He is alert and oriented to person, place, and time. He is not disoriented. He displays no tremor and normal reflexes. No cranial nerve deficit or sensory deficit. He exhibits normal muscle tone. Coordination normal. GCS eye subscore is 4. GCS verbal subscore is 5. GCS motor subscore is 6.  No focal neurologic deficits on my exam. Reported facial droop and extremity weakness has resolved.  Skin: Skin is warm. Capillary refill takes less than 2 seconds. No rash noted. He is not diaphoretic. No erythema. No pallor.  Nursing note and vitals reviewed.    ED Treatments / Results  Labs (all labs ordered are listed, but only abnormal results are displayed) Labs Reviewed  PROTIME-INR - Abnormal; Notable for the following:       Result Value   Prothrombin Time 19.0 (*)    All other components within normal limits  APTT - Abnormal; Notable for the following:    aPTT 51 (*)    All other components within normal limits  CBC - Abnormal; Notable for the following:     Hemoglobin 12.1 (*)    HCT 38.3 (*)    All other components within normal limits  COMPREHENSIVE METABOLIC PANEL - Abnormal; Notable for the following:    Glucose, Bld 103 (*)    BUN 28 (*)    Albumin 3.3 (*)    ALT 12 (*)    Alkaline Phosphatase 30 (*)    GFR calc non Af Amer 52 (*)    All other components within normal limits  URINALYSIS, ROUTINE W REFLEX MICROSCOPIC - Abnormal; Notable for the following:    Color, Urine AMBER (*)    APPearance HAZY (*)    Specific Gravity, Urine 1.039 (*)    Ketones, ur 20 (*)    Leukocytes, UA SMALL (*)    Squamous Epithelial / LPF 0-5 (*)    All other components within normal limits  I-STAT CHEM 8, ED - Abnormal; Notable for the following:    BUN 30 (*)    Hemoglobin 12.9 (*)    HCT 38.0 (*)    All other components within normal limits  URINE CULTURE  DIFFERENTIAL  GLUCOSE, CAPILLARY  I-STAT TROPOININ, ED    EKG  EKG Interpretation  Date/Time:  Thursday June 30 2016 13:55:31 EDT Ventricular Rate:  61 PR Interval:  146 QRS Duration: 134 QT Interval:  440 QTC Calculation: 442 R Axis:   -48 Text Interpretation:  Normal sinus rhythm Left axis deviation Non-specific intra-ventricular conduction block Cannot rule out Anterior infarct , age undetermined Abnormal ECG When compared to prior, new T wave inversion in aVF and worsened inversion in lead 3.  No STEMI Confirmed by Sherry Ruffing MD, Madgeline Rayo [phone]) on 06/30/2016 3:05:06 PM       Radiology Ct Angio Head W Or Wo Contrast  Result Date: 06/30/2016 CLINICAL DATA:  New onset right-sided facial droop and intermittent dysarthria over the last 2 days. EXAM: CT ANGIOGRAPHY HEAD AND NECK TECHNIQUE: Multidetector CT imaging of the head and neck was performed using the standard protocol during bolus administration of intravenous contrast. Multiplanar CT image reconstructions and MIPs were obtained to evaluate the vascular anatomy. Carotid stenosis measurements (when applicable) are obtained  utilizing NASCET criteria, using the distal internal  carotid diameter as the denominator. CONTRAST:  50 mL Isovue 370 COMPARISON:  CT head without contrast from the same day. MRI brain 06/16/2016 FINDINGS: CTA NECK FINDINGS Aortic arch: A 3 vessel arch configuration is present. Atherosclerotic changes are present at the aortic arch without significant stenosis at the great vessel origins. There is no aneurysm. Right carotid system: Common carotid artery is tortuous proximally. There is no focal stenosis. Atherosclerotic calcifications are present at the right carotid bifurcation without significant stenosis relative to the more distal vessel. Remainder of the right internal carotid artery is normal. Left carotid system: The left common carotid artery is tortuous proximally. There is no significant stenosis. Atherosclerotic changes are present at the carotid bifurcation without significant stenosis. The more distal left internal carotid artery is normal. Vertebral arteries: Both vertebral arteries originate from the subclavian arteries without significant stenoses. The vertebral arteries are codominant. There is no significant stenosis or vascular injury to either vertebral artery in the neck. Skeleton: Mild endplate degenerative and facet changes are present. No focal lytic or blastic lesions are present. There is exaggerated thoracic kyphosis and cervical lordosis. Other neck: No focal mucosal or submucosal lesions are present. Bilateral thyroid nodules are present without a dominant lesion. There is no significant adenopathy. The salivary glands are within normal limits. Upper chest: The lung apices demonstrate bilateral dependent atelectasis. No focal nodule or mass lesion is present. Review of the MIP images confirms the above findings CTA HEAD FINDINGS Anterior circulation: Atherosclerotic calcifications are present within the cavernous internal carotid arteries bilaterally without a significant stenosis  through the ICA terminus. The A1 and M1 segments are normal. The anterior communicating artery is patent. The MCA bifurcations are intact. There is segmental attenuation of ACA and MCA branch vessels bilaterally without a significant proximal stenosis or occlusion. Posterior circulation: The vertebral arteries are codominant. Atherosclerotic changes are present at the dural margin. The PICA origins are visualized and normal. The basilar artery is normal. Both posterior cerebral arteries originate from the basilar tip. There is no significant proximal stenosis or occlusion within either posterior cerebral artery. Venous sinuses: The dural sinuses are patent. The right transverse sinus is dominant. The straight sinus deep cerebral veins are intact. The cortical veins are unremarkable. Anatomic variants: None Delayed phase: Not performed. Arterial source images are unremarkable. Review of the MIP images confirms the above findings IMPRESSION: 1. No emerging large vessel occlusion. 2. Atherosclerotic changes at the aortic arch, carotid bifurcations, dural margin of vertebral arteries, and cavernous internal carotid arteries without significant stenoses. 3. Mild diffuse segmental branch vessel irregularity within both the anterior and posterior circulation suggesting intracranial atherosclerotic change. There is no significant proximal stenosis or occlusion. 4. Multilevel spondylosis of the cervical spine. Electronically Signed   By: San Morelle M.D.   On: 06/30/2016 17:03   Dg Chest 2 View  Result Date: 06/30/2016 CLINICAL DATA:  Cough, recent episode of pneumonia, stroke symptoms. History of bladder malignancy EXAM: CHEST  2 VIEW COMPARISON:  Chest x-ray of June 15, 2016 FINDINGS: The lungs are adequately inflated. The interstitial markings are coarse in the mid and lower lung zones but have improved in the upper lobes. The cardiac silhouette remains enlarged. The pulmonary vascularity is not engorged.  There is no pleural effusion. There is calcification in the wall of the thoracic aorta. There is multilevel degenerative disc disease of the thoracic spine. IMPRESSION: There has been some improvement in the appearance of the lungs especially upper lobes but coarse interstitial markings  persist in the lower lobes. There is no discrete pneumonia. There is stable cardiomegaly without pulmonary vascular congestion. Thoracic aortic atherosclerosis. Electronically Signed   By: David  Martinique M.D.   On: 06/30/2016 15:47   Ct Angio Neck W Or Wo Contrast  Result Date: 06/30/2016 CLINICAL DATA:  New onset right-sided facial droop and intermittent dysarthria over the last 2 days. EXAM: CT ANGIOGRAPHY HEAD AND NECK TECHNIQUE: Multidetector CT imaging of the head and neck was performed using the standard protocol during bolus administration of intravenous contrast. Multiplanar CT image reconstructions and MIPs were obtained to evaluate the vascular anatomy. Carotid stenosis measurements (when applicable) are obtained utilizing NASCET criteria, using the distal internal carotid diameter as the denominator. CONTRAST:  50 mL Isovue 370 COMPARISON:  CT head without contrast from the same day. MRI brain 06/16/2016 FINDINGS: CTA NECK FINDINGS Aortic arch: A 3 vessel arch configuration is present. Atherosclerotic changes are present at the aortic arch without significant stenosis at the great vessel origins. There is no aneurysm. Right carotid system: Common carotid artery is tortuous proximally. There is no focal stenosis. Atherosclerotic calcifications are present at the right carotid bifurcation without significant stenosis relative to the more distal vessel. Remainder of the right internal carotid artery is normal. Left carotid system: The left common carotid artery is tortuous proximally. There is no significant stenosis. Atherosclerotic changes are present at the carotid bifurcation without significant stenosis. The more  distal left internal carotid artery is normal. Vertebral arteries: Both vertebral arteries originate from the subclavian arteries without significant stenoses. The vertebral arteries are codominant. There is no significant stenosis or vascular injury to either vertebral artery in the neck. Skeleton: Mild endplate degenerative and facet changes are present. No focal lytic or blastic lesions are present. There is exaggerated thoracic kyphosis and cervical lordosis. Other neck: No focal mucosal or submucosal lesions are present. Bilateral thyroid nodules are present without a dominant lesion. There is no significant adenopathy. The salivary glands are within normal limits. Upper chest: The lung apices demonstrate bilateral dependent atelectasis. No focal nodule or mass lesion is present. Review of the MIP images confirms the above findings CTA HEAD FINDINGS Anterior circulation: Atherosclerotic calcifications are present within the cavernous internal carotid arteries bilaterally without a significant stenosis through the ICA terminus. The A1 and M1 segments are normal. The anterior communicating artery is patent. The MCA bifurcations are intact. There is segmental attenuation of ACA and MCA branch vessels bilaterally without a significant proximal stenosis or occlusion. Posterior circulation: The vertebral arteries are codominant. Atherosclerotic changes are present at the dural margin. The PICA origins are visualized and normal. The basilar artery is normal. Both posterior cerebral arteries originate from the basilar tip. There is no significant proximal stenosis or occlusion within either posterior cerebral artery. Venous sinuses: The dural sinuses are patent. The right transverse sinus is dominant. The straight sinus deep cerebral veins are intact. The cortical veins are unremarkable. Anatomic variants: None Delayed phase: Not performed. Arterial source images are unremarkable. Review of the MIP images confirms the  above findings IMPRESSION: 1. No emerging large vessel occlusion. 2. Atherosclerotic changes at the aortic arch, carotid bifurcations, dural margin of vertebral arteries, and cavernous internal carotid arteries without significant stenoses. 3. Mild diffuse segmental branch vessel irregularity within both the anterior and posterior circulation suggesting intracranial atherosclerotic change. There is no significant proximal stenosis or occlusion. 4. Multilevel spondylosis of the cervical spine. Electronically Signed   By: San Morelle M.D.   On: 06/30/2016  17:03   Ct Cerebral Perfusion W Contrast  Result Date: 06/30/2016 CLINICAL DATA:  Dysarthria.  Right-sided facial droop. EXAM: CT PERFUSION BRAIN TECHNIQUE: Multiphase CT imaging of the brain was performed following IV bolus contrast injection. Subsequent parametric perfusion maps were calculated using RAPID software. CONTRAST:  40 mL Isovue 370 COMPARISON:  None. FINDINGS: CT Brain Perfusion Findings: CBF (<30%) Volume: 77mL Perfusion (Tmax>6.0s) volume: 19mL Mismatch Volume: 43mL Infarction Location:None No significant patient motion is present. Arterial input and venous output functions are excellent. IMPRESSION: Negative CT perfusion exam of the head. No evidence for acute infarct or ischemia. Electronically Signed   By: San Morelle M.D.   On: 06/30/2016 16:54   Ct Head Code Stroke W/o Cm  Addendum Date: 06/30/2016   ADDENDUM REPORT: 06/30/2016 16:06 ADDENDUM: Study discussed by telephone on 06/30/2016 with Dr. Marda Stalker, at 1534 hours. Electronically Signed   By: Genevie Ann M.D.   On: 06/30/2016 16:06   Result Date: 06/30/2016 CLINICAL DATA:  Code stroke. 83 year old male with episode of right leg weakness slurred speech and right facial droop which lasted about 1 hour today. Facial droop persists but other symptoms have resolved. Initial encounter. EXAM: CT HEAD WITHOUT CONTRAST TECHNIQUE: Contiguous axial images were obtained  from the base of the skull through the vertex without intravenous contrast. COMPARISON:  Brain MRI 06/16/2016 and earlier. FINDINGS: Brain: Stable cerebral volume. No midline shift, mass effect, or evidence of intracranial mass lesion. No ventriculomegaly. No acute intracranial hemorrhage identified. Stable gray-white matter differentiation throughout the brain. Chronic white matter hypodensity appears stable. No cortically based acute infarct identified. Vascular: Calcified atherosclerosis at the skull base. No suspicious intracranial vascular hyperdensity. Skull: No acute osseous abnormality identified. Sinuses/Orbits: Visualized paranasal sinuses and mastoids are stable and well pneumatized. Other: No acute orbit or scalp soft tissue findings. ASPECTS ([place] Stroke Program Early CT Score) - Ganglionic level infarction (caudate, lentiform nuclei, internal capsule, insula, M1-M3 cortex): 7 - Supraganglionic infarction (M4-M6 cortex): 3 Total score (0-10 with 10 being normal): 10 IMPRESSION: 1. Stable non contrast CT appearance of the brain, no acute intracranial abnormality. 2. ASPECTS is 10. 3. Chronic white matter changes. Electronically Signed: By: Genevie Ann M.D. On: 06/30/2016 15:24    Procedures Procedures (including critical care time)  Medications Ordered in ED Medications  loratadine (CLARITIN) tablet 10 mg (not administered)  pantoprazole (PROTONIX) EC tablet 40 mg (40 mg Oral Given 06/30/16 2231)  tamsulosin (FLOMAX) capsule 0.4 mg (0.4 mg Oral Given 06/30/16 2231)  apixaban (ELIQUIS) tablet 5 mg (5 mg Oral Given 06/30/16 2231)  traZODone (DESYREL) tablet 50 mg (not administered)  simvastatin (ZOCOR) tablet 40 mg (40 mg Oral Given 06/30/16 2231)  polyvinyl alcohol (LIQUIFILM TEARS) 1.4 % ophthalmic solution 1 drop (not administered)   stroke: mapping our early stages of recovery book (not administered)  acetaminophen (TYLENOL) tablet 650 mg (not administered)    Or  acetaminophen (TYLENOL)  solution 650 mg (not administered)    Or  acetaminophen (TYLENOL) suppository 650 mg (not administered)  senna-docusate (Senokot-S) tablet 1 tablet (not administered)  guaiFENesin (MUCINEX) 12 hr tablet 600 mg (600 mg Oral Given 06/30/16 2231)  ipratropium-albuterol (DUONEB) 0.5-2.5 (3) MG/3ML nebulizer solution 3 mL (not administered)  0.9 %  sodium chloride infusion ( Intravenous New Bag/Given 06/30/16 2232)  aspirin tablet 325 mg (not administered)     Initial Impression / Assessment and Plan / ED Course  I have reviewed the triage vital signs and the nursing notes.  Pertinent  labs & imaging results that were available during my care of the patient were reviewed by me and considered in my medical decision making (see chart for details).     Jason [name] is a 83 y.o. male with a past medical history significant for atrial fibrillation on Eliquis, GERD, hyperlipidemia, and recent pneumonia who presents for neurologic deficits. According to patient and family, patient was last normal 2 days ago. Patient has had difficulty with speaking with expressive aphasia intermittently and today had a right face, right arm, and right leg weakness. Patient had right facial droop that has been improving throughout the day. Patient denies headaches, vision changes, nausea, vomiting, constipation, diarrhea, or dysuria. He denies any shortness of breath or chest pain but does report continued congestion and cough since his recent pneumonia.  On my exam, patient had no focal deficits neurologically. Patient had symmetric grip strength and no weakness on exam. No facial droop seen. Patient had no expressive aphasia for me.  CT imaging was obtained showing no acute intracranial bleed. Neurology was called who came to see the patient. They felt the patient likely is having stroke versus TIA. They recommended admission for MRI and further workup.  Patient admitted to hospitalist service for further workup of  possible stroke versus TIA.     Final Clinical Impressions(s) / ED Diagnoses   Final diagnoses:  Facial droop  Dysarthria  Stroke (cerebrum) ([place])  CVA (cerebral vascular accident) ([place])     Clinical Impression: 1. Facial droop   2. Dysarthria   3. Stroke (cerebrum) ([facility])   4. CVA (cerebral vascular accident) [facility])     Disposition: Admit to [place], MD 07/01/16 [phone]

## 2016-06-30 NOTE — ED Notes (Signed)
ED Provider at bedside. 

## 2016-06-30 NOTE — Consult Note (Signed)
Requesting Physician: ED MD    Chief Complaint: facial droop and right leg weakness  History obtained from:  Patient   family  HPI:                                                                                                                                         Jason [name] is an 83 y.o. male who was seen one weak ago for swallowing difficulties and generalized weakness. At that time " I spoke to the daughter in depth. Apparently he has been having difficulty with swallowing and choking on his food. He has been seen by his PCP and they thought it might be his records esophagus. Patient is also been noticing to drool on the left side of his mouth more than usual. They attributed that also to his Bell's palsy. It was noted that his his mother did have Parkinson's however naming the symptoms of Parkinson's patient's daughter did not note any significant symptoms that jumped out to her. She has noticed that while he eats he often tires out while chewing, in addition he also will have a hard time holding his head up for prolonged periods of time." Speech had seen patient at that time and noted "Oral function chaacteraized by mild lingual weakness observed via mild lingual residuals, with functional abiltiy to masticate and transit soft solids (though endurance not taxed). Flash penetration and silent aspriation during the swallow occured with boluses larger than 70ml (estimate) due to slow laryngeal closure and epiglottic deflection. Pt was able trigger a timely swallow and eventually achieve full laryngeal closure which expelled penetrate when possible, but again sluggish movement kept vestible partialy open during bolus passage. Further silent aspiration events also occured after the swallow if pt was cued to cough, which spilled valleuular residual into the airway. Attempted chin tuck without improvement and mendelsohn with visual biofeedback (pt unable after 5 attempts with max cues). Best methods  included verbal cues for small sips (bolus size consistently comparable in appearance and fucntion with 64mL teaspoon), oral hold and effortful swallow x2. "  Today he was noted to have a sudden right facial droop, worsening of dysarthria and right leg plegia.   Neurology was called and patient was brought for a emergent CT perfusion to evaluate for M1 stenosis or occlusion.   Of note he had a Myasthenia panel obtained on previous hospitalization which was negative.   Date last known well: Date: 06/30/2016 Time last known well: Time: 11:30 tPA Given: No: out of window   Past Medical History:  Diagnosis Date  . Arthritis   . Barrett esophagus   . Bladder cancer ([place]) dx'd 3716,9678   surg only  . Colon polyps    adenomatous  . Difficulty sleeping    OCCASIONALLY  . Diverticulosis of colon (without mention of hemorrhage)   . Frequency of urination   . GERD (gastroesophageal  reflux disease)   . Hiatal hernia   . History of bladder cancer 1981   EXCISION ONLY  . History of skin cancer    BASAL CELL  . Hyperlipidemia   . Lymphoma, non-Hodgkin's ([place]) 2011   [place]   . Macular degeneration    LEFT EYE  . Multiple nodules of lung 09/16/2011  . PAF (paroxysmal atrial fibrillation) ([place])   . Personal history of colonic polyps 07/15/2010   tubular adenoma  . Prostate cancer [facility])    patient denies  . PUD (peptic ulcer disease)   . Stomach cancer ([place])    [facility] origin    Past Surgical History:  Procedure Laterality Date  . APPENDECTOMY    . bone spur     left shoulder  . CHOLECYSTECTOMY    . KNEE ARTHROPLASTY Left 07/30/2015   Procedure: LEFT TOTAL KNEE ARTHROPLASTY WITH COMPUTER NAVIGATION;  Surgeon: Rod Can, MD;  Location: [facility];  Service: Orthopedics;  Laterality: Left;  . LASER OF PROSTATE W/ GREEN LIGHT PVP    . TENDON REPAIR     right arm  . TRIGGER FINGER RELEASE     bilateral hands  . WRIST SURGERY     Bilateral    Family History  Problem Relation  Age of Onset  . Prostate cancer Father   . Bone cancer Brother   . Cancer Brother     back   Social History:  reports that he quit smoking about 49 years ago. He quit smokeless tobacco use about 18 years ago. He reports that he does not drink alcohol or use drugs.  Allergies: No Known Allergies  Medications:                                                                                                                           No current facility-administered medications for this encounter.    Current Outpatient Prescriptions  Medication Sig Dispense Refill  . apixaban (ELIQUIS) 5 MG TABS tablet Take 5 mg by mouth 2 (two) times daily.    Marland Kitchen esomeprazole (NEXIUM) 40 MG capsule TAKE 1 CAPSULE (40 MG TOTAL) BY MOUTH 2 (TWO) TIMES DAILY BEFORE A MEAL. 60 capsule 6  . fish oil-omega-3 fatty acids 1000 MG capsule Take 1 g by mouth 2 (two) times daily.     Marland Kitchen loratadine (CLARITIN) 10 MG tablet Take 10 mg by mouth daily as needed for allergies.    . Multiple Vitamins-Minerals (ICAPS MV PO) Take by mouth 2 (two) times daily.      Vladimir Faster Glycol-Propyl Glycol (SYSTANE) 0.4-0.3 % SOLN Apply 1 drop to eye daily as needed (dry eyes).    . simvastatin (ZOCOR) 40 MG tablet Take 40 mg by mouth at bedtime.      . tamsulosin (FLOMAX) 0.4 MG CAPS capsule Take 0.4 mg by mouth at bedtime.    . traZODone (DESYREL) 50 MG tablet Take 50 mg by mouth at bedtime as needed for  sleep.       ROS:                                                                                                                                       History obtained from the patient  General ROS: negative for - chills, fatigue, fever, night sweats, weight gain or weight loss Psychological ROS: negative for - behavioral disorder, hallucinations, memory difficulties, mood swings or suicidal ideation Ophthalmic ROS: negative for - blurry vision, double vision, eye pain or loss of vision ENT ROS: negative for - epistaxis, nasal  discharge, oral lesions, sore throat, tinnitus or vertigo Allergy and Immunology ROS: negative for - hives or itchy/watery eyes Hematological and Lymphatic ROS: negative for - bleeding problems, bruising or swollen lymph nodes Endocrine ROS: negative for - galactorrhea, hair pattern changes, polydipsia/polyuria or temperature intolerance Respiratory ROS: negative for - cough, hemoptysis, shortness of breath or wheezing Cardiovascular ROS: negative for - chest pain, dyspnea on exertion, edema or irregular heartbeat Gastrointestinal ROS: negative for - abdominal pain, diarrhea, hematemesis, nausea/vomiting or stool incontinence Genito-Urinary ROS: negative for - dysuria, hematuria, incontinence or urinary frequency/urgency Musculoskeletal ROS: negative for - joint swelling or muscular weakness Neurological ROS: as noted in HPI Dermatological ROS: negative for rash and skin lesion changes  Neurologic Examination:                                                                                                      Blood pressure (!) 129/55, pulse 66, temperature 98.4 F (36.9 C), temperature source Oral, resp. rate 20, height 5\' 10"  (1.778 m), weight 105.7 kg (233 lb), SpO2 100 %.  HEENT-  Normocephalic, no lesions, without obvious abnormality.  Normal external eye and conjunctiva.  Normal TM's bilaterally.  Normal auditory canals and external ears. Normal external nose, mucus membranes and septum.  Normal pharynx. Cardiovascular- S1, S2 normal, pulses palpable throughout   Lungs- chest clear, no wheezing, rales, normal symmetric air entry Abdomen- normal findings: bowel sounds normal Extremities- no edema Lymph-no adenopathy palpable Musculoskeletal-no joint tenderness, deformity or swelling Skin-warm and dry, no hyperpigmentation, vitiligo, or suspicious lesions  Neurological Examination Mental Status: Alert, oriented, thought content appropriate.  Speech very dysarthric without evidence of  aphasia.  Able to follow 3 step commands without difficulty. Cranial Nerves: II:  Visual fields grossly normal,  III,IV, VI: ptosis not present, extra-ocular motions intact bilaterally, pupils equal, round, reactive to light and accommodation V,VII: smile asymmetric on the right, facial light  touch sensation normal bilaterally VIII: hearing normal bilaterally IX,X: uvula rises symmetrically XI: bilateral shoulder shrug XII: midline tongue extension Motor: Right : Upper extremity   5/5    Left:     Upper extremity   5/5  Lower extremity   5/5     Lower extremity   5/5 Tone and bulk:normal tone throughout; no atrophy noted Sensory: Pinprick and light touch intact throughout, bilaterally Deep Tendon Reflexes: 1+ and symmetric throughout Plantars: Right: downgoing   Left: downgoing Cerebellar: normal finger-to-nose,  and normal heel-to-shin test Gait: not tested       Lab Results: Basic Metabolic Panel:  Recent Labs Lab 06/30/16 1524 06/30/16 1530  NA 140 142  K 3.9 3.8  CL 106 105  CO2 25  --   GLUCOSE 103* 98  BUN 28* 30*  CREATININE 1.21 1.10  CALCIUM 9.1  --     Liver Function Tests:  Recent Labs Lab 06/30/16 1524  AST 23  ALT 12*  ALKPHOS 30*  BILITOT 0.8  PROT 6.5  ALBUMIN 3.3*   No results for input(s): LIPASE, AMYLASE in the last 168 hours. No results for input(s): AMMONIA in the last 168 hours.  CBC:  Recent Labs Lab 06/30/16 1524 06/30/16 1530  WBC 8.2  --   NEUTROABS 5.8  --   HGB 12.1* 12.9*  HCT 38.3* 38.0*  MCV 85.5  --   PLT 194  --     Cardiac Enzymes: No results for input(s): CKTOTAL, CKMB, CKMBINDEX, TROPONINI in the last 168 hours.  Lipid Panel: No results for input(s): CHOL, TRIG, HDL, CHOLHDL, VLDL, LDLCALC in the last 168 hours.  CBG: No results for input(s): GLUCAP in the last 168 hours.  Microbiology: Results for orders placed or performed during the hospital encounter of 06/15/16  Blood culture (routine x 2)      Status: None   Collection Time: 06/15/16  9:20 AM  Result Value Ref Range Status   Specimen Description BLOOD RIGHT ANTECUBITAL  Final   Special Requests BOTTLES DRAWN AEROBIC ONLY 10CC  Final   Culture NO GROWTH 5 DAYS  Final   Report Status 06/20/2016 FINAL  Final  Blood culture (routine x 2)     Status: None   Collection Time: 06/15/16  9:30 AM  Result Value Ref Range Status   Specimen Description BLOOD RIGHT HAND  Final   Special Requests BOTTLES DRAWN AEROBIC ONLY 10CC  Final   Culture NO GROWTH 5 DAYS  Final   Report Status 06/20/2016 FINAL  Final  Culture, sputum-assessment     Status: None   Collection Time: 06/16/16  8:58 AM  Result Value Ref Range Status   Specimen Description SPUTUM  Final   Special Requests NONE  Final   Sputum evaluation   Final    Sputum specimen not acceptable for testing.  Please recollect.   Gram Stain Report Called to,Read Back By and Verified With: RN Micheline Rough [phone] 1153AM MLM    Report Status 06/16/2016 FINAL  Final  MRSA PCR Screening     Status: None   Collection Time: 06/16/16  2:11 PM  Result Value Ref Range Status   MRSA by PCR NEGATIVE NEGATIVE Final    Comment:        The GeneXpert MRSA Assay (FDA approved for NASAL specimens only), is one component of a comprehensive MRSA colonization surveillance program. It is not intended to diagnose MRSA infection nor to guide or monitor treatment for MRSA infections.  Coagulation Studies:  Recent Labs  06/30/16 1524  LABPROT 19.0*  INR 1.58    Imaging: Dg Chest 2 View  Result Date: 06/30/2016 CLINICAL DATA:  Cough, recent episode of pneumonia, stroke symptoms. History of bladder malignancy EXAM: CHEST  2 VIEW COMPARISON:  Chest x-ray of June 15, 2016 FINDINGS: The lungs are adequately inflated. The interstitial markings are coarse in the mid and lower lung zones but have improved in the upper lobes. The cardiac silhouette remains enlarged. The pulmonary vascularity is not  engorged. There is no pleural effusion. There is calcification in the wall of the thoracic aorta. There is multilevel degenerative disc disease of the thoracic spine. IMPRESSION: There has been some improvement in the appearance of the lungs especially upper lobes but coarse interstitial markings persist in the lower lobes. There is no discrete pneumonia. There is stable cardiomegaly without pulmonary vascular congestion. Thoracic aortic atherosclerosis. Electronically Signed   By: David  Martinique M.D.   On: 06/30/2016 15:47   Ct Head Code Stroke W/o Cm  Addendum Date: 06/30/2016   ADDENDUM REPORT: 06/30/2016 16:06 ADDENDUM: Study discussed by telephone on 06/30/2016 with Dr. Marda Stalker, at 1534 hours. Electronically Signed   By: Genevie Ann M.D.   On: 06/30/2016 16:06   Result Date: 06/30/2016 CLINICAL DATA:  Code stroke. 83 year old male with episode of right leg weakness slurred speech and right facial droop which lasted about 1 hour today. Facial droop persists but other symptoms have resolved. Initial encounter. EXAM: CT HEAD WITHOUT CONTRAST TECHNIQUE: Contiguous axial images were obtained from the base of the skull through the vertex without intravenous contrast. COMPARISON:  Brain MRI 06/16/2016 and earlier. FINDINGS: Brain: Stable cerebral volume. No midline shift, mass effect, or evidence of intracranial mass lesion. No ventriculomegaly. No acute intracranial hemorrhage identified. Stable gray-white matter differentiation throughout the brain. Chronic white matter hypodensity appears stable. No cortically based acute infarct identified. Vascular: Calcified atherosclerosis at the skull base. No suspicious intracranial vascular hyperdensity. Skull: No acute osseous abnormality identified. Sinuses/Orbits: Visualized paranasal sinuses and mastoids are stable and well pneumatized. Other: No acute orbit or scalp soft tissue findings. ASPECTS ([place] Stroke Program Early CT Score) - Ganglionic level  infarction (caudate, lentiform nuclei, internal capsule, insula, M1-M3 cortex): 7 - Supraganglionic infarction (M4-M6 cortex): 3 Total score (0-10 with 10 being normal): 10 IMPRESSION: 1. Stable non contrast CT appearance of the brain, no acute intracranial abnormality. 2. ASPECTS is 10. 3. Chronic white matter changes. Electronically Signed: By: Genevie Ann M.D. On: 06/30/2016 15:24       Assessment and plan discussed with with attending physician and they are in agreement.    Etta Quill PA-C [facility] [phone]  06/30/2016, 4:17 PM   Assessment: 83 y.o. male with transient right sided hemiplegia. This is in the setting of a longer-term progressive decline which is concerning for possible neurodegenerative condition such as ALS(this might no means definite). I think that the event today, however, is something very different and sounds very much like TIA. I do think that he will need a workup for TIA, but will need an outpatient EMG for further evaluation of his neuromuscular disorder(please see neurology notes from previous admission for further information on this.)  Stroke Risk Factors - atrial fibrillation and hyperlipidemia  1. HgbA1c, fasting lipid panel 2. MRI, MRA  of the brain without contrast 3. Frequent neuro checks 4. Echocardiogram 5. Carotid dopplers 6. Prophylactic therapy-Antiplatelet med: Aspirin - dose 325mg  PO or 300mg  PR 7. Risk factor  modification 8. Telemetry monitoring 9. PT consult, OT consult, Speech consult 10. please page stroke NP  Or  PA  Or MD  from 8am -4 pm starting 3/20 as this patient will be followed by the stroke team at this point.   You can look them up on [email or web address]     Roland Rack, MD [facility] [phone]  If 7pm- 7am, please page neurology on call as listed in [place].

## 2016-06-30 NOTE — Progress Notes (Signed)
Prt arrived to unit via bed from ED. States no pain at this time. Oriented to unit.

## 2016-06-30 NOTE — H&P (Addendum)
History and Physical    Jason [name]:[id number] DOB: 1927-12-03 DOA: 06/30/2016  Referring MD/NP/PA: Dr. Sherry Ruffing PCP: Melinda Crutch, MD  Patient coming from: [facility] via EMS   Chief Complaint: Slurred speech and right-sided weakness  HPI: Jason [name] is a 83 y.o. right handed male with medical history significant of PAF on Eliquis, Barrett's esophagus, bladder cancer, non-Hodgkin's lymphoma, OA, and HLD;  who presents with acute onset of slurred speech, right-sided facial droop, and right sided upper and lower extremity weakness. Patient's daughter helps provide history and states that when she went to visit the patient at the rehabilitation facility she noticed that his speech was significantly slurred to the point where she was unable to understand him. Patient was unable to lift his right leg or right arm. He was quoted as stating  that his leg felt like lead. On further investigation, his daughter notes that symptoms slurred speech may have initially started 2 days ago. She tried to get the patient to sign his name on checks to pay his bills at home, but the signature look like scribble. He had been previously able to do this earlier in the week. He complains of generalized weakness and continued cough with clear sputum production (family expresses that cough has been present for months). Denies any fever, chills, chest pain, palpitations, abdominal pain, nausea, vomiting, and dysuria.   He had just been recently hospitalized from 3/14 -3/19 for community-acquired pneumonia. During hospitalization he had an event of unresponsiveness with bradycardia, but cardiology felt it was noncardiac in nature. Patient had negative MRI, MRA, and EEG studies. Due to swallowing difficulties patient was evaluated by speech therapy and seen to be silently aspirating. Patient was placed on modified diet with cues as seen below in assessment and plan. There was also concern for myasthenia gravis, but  workup negative. Upon discharge he was sent to a skilled nursing facility for rehabilitation and completed a full course of Levaquin on 3/21. Family notes prior to this hospitalization he had been living independently in his home, but thereafter has needed full assistance ambulating.   ED Course: Upon admission into the emergency department patient was seen to be afebrile, pulse 55-66, respirations 13-24, and all other vitals within normal limits. Lab work revealed hemoglobin of 12.1, BUN 28, and creatinine 1.21. Initial imaging studies showed no acute signs of stroke. Neurology was consulted, but patient was out of the window for TPA and symptoms appeared to be resolving. Neurology recommended medicine admission with completion of TIA/stroke work-up.Family notes continued slurred speech not at baseline.  Review of Systems: As per HPI otherwise 10 point review of systems negative.   Past Medical History:  Diagnosis Date  . Arthritis   . Barrett esophagus   . Bladder cancer ([place]) dx'd 8119,1478   surg only  . Colon polyps    adenomatous  . Difficulty sleeping    OCCASIONALLY  . Diverticulosis of colon (without mention of hemorrhage)   . Frequency of urination   . GERD (gastroesophageal reflux disease)   . Hiatal hernia   . History of bladder cancer 1981   EXCISION ONLY  . History of skin cancer    BASAL CELL  . Hyperlipidemia   . Lymphoma, non-Hodgkin's ([place]) 2011   [place]   . Macular degeneration    LEFT EYE  . Multiple nodules of lung 09/16/2011  . PAF (paroxysmal atrial fibrillation) ([place])   . Personal history of colonic polyps 07/15/2010   tubular  adenoma  . Prostate cancer [facility])    patient denies  . PUD (peptic ulcer disease)   . Stomach cancer ([place])    NHL origin    Past Surgical History:  Procedure Laterality Date  . APPENDECTOMY    . bone spur     left shoulder  . CHOLECYSTECTOMY    . KNEE ARTHROPLASTY Left 07/30/2015   Procedure: LEFT TOTAL KNEE  ARTHROPLASTY WITH COMPUTER NAVIGATION;  Surgeon: Rod Can, MD;  Location: [facility] ORS;  Service: Orthopedics;  Laterality: Left;  . LASER OF PROSTATE W/ GREEN LIGHT PVP    . TENDON REPAIR     right arm  . TRIGGER FINGER RELEASE     bilateral hands  . WRIST SURGERY     Bilateral     reports that he quit smoking about 49 years ago. He quit smokeless tobacco use about 18 years ago. He reports that he does not drink alcohol or use drugs.  No Known Allergies  Family History  Problem Relation Age of Onset  . Prostate cancer Father   . Bone cancer Brother   . Cancer Brother     back    Prior to Admission medications   Medication Sig Start Date End Date Taking? Authorizing Provider  apixaban (ELIQUIS) 5 MG TABS tablet Take 5 mg by mouth 2 (two) times daily.   Yes Historical Provider, MD  esomeprazole (NEXIUM) 40 MG capsule TAKE 1 CAPSULE (40 MG TOTAL) BY MOUTH 2 (TWO) TIMES DAILY BEFORE A MEAL. 03/17/16  Yes Irene Shipper, MD  loratadine (CLARITIN) 10 MG tablet Take 10 mg by mouth daily as needed for allergies.   Yes Historical Provider, MD  Multiple Vitamins-Minerals (ICAPS MV PO) Take 1 capsule by mouth 2 (two) times daily.    Yes Historical Provider, MD  Polyethyl Glycol-Propyl Glycol (SYSTANE) 0.4-0.3 % SOLN Place 1 drop into both eyes daily as needed (for dry eyes).    Yes Historical Provider, MD  simvastatin (ZOCOR) 40 MG tablet Take 40 mg by mouth at bedtime.     Yes Historical Provider, MD  tamsulosin (FLOMAX) 0.4 MG CAPS capsule Take 0.4 mg by mouth at bedtime.   Yes Historical Provider, MD  traZODone (DESYREL) 50 MG tablet Take 50 mg by mouth at bedtime as needed for sleep.   Yes Historical Provider, MD  fish oil-omega-3 fatty acids 1000 MG capsule Take 1 g by mouth 2 (two) times daily.     Historical Provider, MD    Physical Exam:    Constitutional:  Elderly male who appears in NAD and is able to follow commands.  Vitals:   06/30/16 1830 06/30/16 1845 06/30/16 1900  06/30/16 1930  BP: (!) 147/70 136/64 133/68 (!) 150/63  Pulse: 64 (!) 55 (!) 57 60  Resp: 16 15 14 13   Temp:      TempSrc:      SpO2: 99% 99% 97% 99%  Weight:      Height:       Eyes: PERRL, lids and conjunctivae normal ENMT: Mucous membranes are moist. Posterior pharynx clear of any exudate or lesions.Normal dentition.  Neck: normal, supple, no masses, no thyromegaly Respiratory: clear to auscultation bilaterally, no wheezing, no crackles. Normal respiratory effort. No accessory muscle use.  Cardiovascular: Regular rate and rhythm, no murmurs / rubs / gallops. No extremity edema. 2+ pedal pulses. No carotid bruits.  Abdomen: no tenderness, no masses palpated. No hepatosplenomegaly. Bowel sounds positive.  Musculoskeletal: no clubbing / cyanosis. No joint deformity  upper and lower extremities. Good ROM, no contractures. Normal muscle tone.  Skin: no rashes, lesions, ulcers. No induration Neurologic: CN 2-12 grossly intact. Sensation intact, DTR normal. Speech is still slurred and slight right sided lower extremity drift noted. Otherwise, strength 5/5 in all other extremities  Psychiatric: Normal judgment and insight. Alert and oriented x 3. Normal mood.     Labs on Admission: I have personally reviewed following labs and imaging studies  CBC:  Recent Labs Lab 06/30/16 1524 06/30/16 1530  WBC 8.2  --   NEUTROABS 5.8  --   HGB 12.1* 12.9*  HCT 38.3* 38.0*  MCV 85.5  --   PLT 194  --    Basic Metabolic Panel:  Recent Labs Lab 06/30/16 1524 06/30/16 1530  NA 140 142  K 3.9 3.8  CL 106 105  CO2 25  --   GLUCOSE 103* 98  BUN 28* 30*  CREATININE 1.21 1.10  CALCIUM 9.1  --    GFR: Estimated Creatinine Clearance: 58.7 mL/min (by C-G formula based on SCr of 1.1 mg/dL). Liver Function Tests:  Recent Labs Lab 06/30/16 1524  AST 23  ALT 12*  ALKPHOS 30*  BILITOT 0.8  PROT 6.5  ALBUMIN 3.3*   No results for input(s): LIPASE, AMYLASE in the last 168 hours. No  results for input(s): AMMONIA in the last 168 hours. Coagulation Profile:  Recent Labs Lab 06/30/16 1524  INR 1.58   Cardiac Enzymes: No results for input(s): CKTOTAL, CKMB, CKMBINDEX, TROPONINI in the last 168 hours. BNP (last 3 results) No results for input(s): PROBNP in the last 8760 hours. HbA1C: No results for input(s): HGBA1C in the last 72 hours. CBG: No results for input(s): GLUCAP in the last 168 hours. Lipid Profile: No results for input(s): CHOL, HDL, LDLCALC, TRIG, CHOLHDL, LDLDIRECT in the last 72 hours. Thyroid Function Tests: No results for input(s): TSH, T4TOTAL, FREET4, T3FREE, THYROIDAB in the last 72 hours. Anemia Panel: No results for input(s): VITAMINB12, FOLATE, FERRITIN, TIBC, IRON, RETICCTPCT in the last 72 hours. Urine analysis:    Component Value Date/Time   COLORURINE AMBER (A) 06/30/2016 1730   APPEARANCEUR HAZY (A) 06/30/2016 1730   LABSPEC 1.039 (H) 06/30/2016 1730   PHURINE 5.0 06/30/2016 1730   GLUCOSEU NEGATIVE 06/30/2016 1730   HGBUR NEGATIVE 06/30/2016 1730   BILIRUBINUR NEGATIVE 06/30/2016 1730   KETONESUR 20 (A) 06/30/2016 1730   PROTEINUR NEGATIVE 06/30/2016 1730   UROBILINOGEN 1.0 07/07/2011 1942   NITRITE NEGATIVE 06/30/2016 1730   LEUKOCYTESUR SMALL (A) 06/30/2016 1730   Sepsis Labs: No results found for this or any previous visit (from the past 240 hour(s)).   Radiological Exams on Admission: Ct Angio Head W Or Wo Contrast  Result Date: 06/30/2016 CLINICAL DATA:  New onset right-sided facial droop and intermittent dysarthria over the last 2 days. EXAM: CT ANGIOGRAPHY HEAD AND NECK TECHNIQUE: Multidetector CT imaging of the head and neck was performed using the standard protocol during bolus administration of intravenous contrast. Multiplanar CT image reconstructions and MIPs were obtained to evaluate the vascular anatomy. Carotid stenosis measurements (when applicable) are obtained utilizing NASCET criteria, using the distal  internal carotid diameter as the denominator. CONTRAST:  50 mL Isovue 370 COMPARISON:  CT head without contrast from the same day. MRI brain 06/16/2016 FINDINGS: CTA NECK FINDINGS Aortic arch: A 3 vessel arch configuration is present. Atherosclerotic changes are present at the aortic arch without significant stenosis at the great vessel origins. There is no aneurysm. Right carotid  system: Common carotid artery is tortuous proximally. There is no focal stenosis. Atherosclerotic calcifications are present at the right carotid bifurcation without significant stenosis relative to the more distal vessel. Remainder of the right internal carotid artery is normal. Left carotid system: The left common carotid artery is tortuous proximally. There is no significant stenosis. Atherosclerotic changes are present at the carotid bifurcation without significant stenosis. The more distal left internal carotid artery is normal. Vertebral arteries: Both vertebral arteries originate from the subclavian arteries without significant stenoses. The vertebral arteries are codominant. There is no significant stenosis or vascular injury to either vertebral artery in the neck. Skeleton: Mild endplate degenerative and facet changes are present. No focal lytic or blastic lesions are present. There is exaggerated thoracic kyphosis and cervical lordosis. Other neck: No focal mucosal or submucosal lesions are present. Bilateral thyroid nodules are present without a dominant lesion. There is no significant adenopathy. The salivary glands are within normal limits. Upper chest: The lung apices demonstrate bilateral dependent atelectasis. No focal nodule or mass lesion is present. Review of the MIP images confirms the above findings CTA HEAD FINDINGS Anterior circulation: Atherosclerotic calcifications are present within the cavernous internal carotid arteries bilaterally without a significant stenosis through the ICA terminus. The A1 and M1 segments  are normal. The anterior communicating artery is patent. The MCA bifurcations are intact. There is segmental attenuation of ACA and MCA branch vessels bilaterally without a significant proximal stenosis or occlusion. Posterior circulation: The vertebral arteries are codominant. Atherosclerotic changes are present at the dural margin. The PICA origins are visualized and normal. The basilar artery is normal. Both posterior cerebral arteries originate from the basilar tip. There is no significant proximal stenosis or occlusion within either posterior cerebral artery. Venous sinuses: The dural sinuses are patent. The right transverse sinus is dominant. The straight sinus deep cerebral veins are intact. The cortical veins are unremarkable. Anatomic variants: None Delayed phase: Not performed. Arterial source images are unremarkable. Review of the MIP images confirms the above findings IMPRESSION: 1. No emerging large vessel occlusion. 2. Atherosclerotic changes at the aortic arch, carotid bifurcations, dural margin of vertebral arteries, and cavernous internal carotid arteries without significant stenoses. 3. Mild diffuse segmental branch vessel irregularity within both the anterior and posterior circulation suggesting intracranial atherosclerotic change. There is no significant proximal stenosis or occlusion. 4. Multilevel spondylosis of the cervical spine. Electronically Signed   By: San Morelle M.D.   On: 06/30/2016 17:03   Dg Chest 2 View  Result Date: 06/30/2016 CLINICAL DATA:  Cough, recent episode of pneumonia, stroke symptoms. History of bladder malignancy EXAM: CHEST  2 VIEW COMPARISON:  Chest x-ray of June 15, 2016 FINDINGS: The lungs are adequately inflated. The interstitial markings are coarse in the mid and lower lung zones but have improved in the upper lobes. The cardiac silhouette remains enlarged. The pulmonary vascularity is not engorged. There is no pleural effusion. There is  calcification in the wall of the thoracic aorta. There is multilevel degenerative disc disease of the thoracic spine. IMPRESSION: There has been some improvement in the appearance of the lungs especially upper lobes but coarse interstitial markings persist in the lower lobes. There is no discrete pneumonia. There is stable cardiomegaly without pulmonary vascular congestion. Thoracic aortic atherosclerosis. Electronically Signed   By: David  Martinique M.D.   On: 06/30/2016 15:47   Ct Angio Neck W Or Wo Contrast  Result Date: 06/30/2016 CLINICAL DATA:  New onset right-sided facial droop and intermittent dysarthria  over the last 2 days. EXAM: CT ANGIOGRAPHY HEAD AND NECK TECHNIQUE: Multidetector CT imaging of the head and neck was performed using the standard protocol during bolus administration of intravenous contrast. Multiplanar CT image reconstructions and MIPs were obtained to evaluate the vascular anatomy. Carotid stenosis measurements (when applicable) are obtained utilizing NASCET criteria, using the distal internal carotid diameter as the denominator. CONTRAST:  50 mL Isovue 370 COMPARISON:  CT head without contrast from the same day. MRI brain 06/16/2016 FINDINGS: CTA NECK FINDINGS Aortic arch: A 3 vessel arch configuration is present. Atherosclerotic changes are present at the aortic arch without significant stenosis at the great vessel origins. There is no aneurysm. Right carotid system: Common carotid artery is tortuous proximally. There is no focal stenosis. Atherosclerotic calcifications are present at the right carotid bifurcation without significant stenosis relative to the more distal vessel. Remainder of the right internal carotid artery is normal. Left carotid system: The left common carotid artery is tortuous proximally. There is no significant stenosis. Atherosclerotic changes are present at the carotid bifurcation without significant stenosis. The more distal left internal carotid artery is  normal. Vertebral arteries: Both vertebral arteries originate from the subclavian arteries without significant stenoses. The vertebral arteries are codominant. There is no significant stenosis or vascular injury to either vertebral artery in the neck. Skeleton: Mild endplate degenerative and facet changes are present. No focal lytic or blastic lesions are present. There is exaggerated thoracic kyphosis and cervical lordosis. Other neck: No focal mucosal or submucosal lesions are present. Bilateral thyroid nodules are present without a dominant lesion. There is no significant adenopathy. The salivary glands are within normal limits. Upper chest: The lung apices demonstrate bilateral dependent atelectasis. No focal nodule or mass lesion is present. Review of the MIP images confirms the above findings CTA HEAD FINDINGS Anterior circulation: Atherosclerotic calcifications are present within the cavernous internal carotid arteries bilaterally without a significant stenosis through the ICA terminus. The A1 and M1 segments are normal. The anterior communicating artery is patent. The MCA bifurcations are intact. There is segmental attenuation of ACA and MCA branch vessels bilaterally without a significant proximal stenosis or occlusion. Posterior circulation: The vertebral arteries are codominant. Atherosclerotic changes are present at the dural margin. The PICA origins are visualized and normal. The basilar artery is normal. Both posterior cerebral arteries originate from the basilar tip. There is no significant proximal stenosis or occlusion within either posterior cerebral artery. Venous sinuses: The dural sinuses are patent. The right transverse sinus is dominant. The straight sinus deep cerebral veins are intact. The cortical veins are unremarkable. Anatomic variants: None Delayed phase: Not performed. Arterial source images are unremarkable. Review of the MIP images confirms the above findings IMPRESSION: 1. No  emerging large vessel occlusion. 2. Atherosclerotic changes at the aortic arch, carotid bifurcations, dural margin of vertebral arteries, and cavernous internal carotid arteries without significant stenoses. 3. Mild diffuse segmental branch vessel irregularity within both the anterior and posterior circulation suggesting intracranial atherosclerotic change. There is no significant proximal stenosis or occlusion. 4. Multilevel spondylosis of the cervical spine. Electronically Signed   By: San Morelle M.D.   On: 06/30/2016 17:03   Ct Cerebral Perfusion W Contrast  Result Date: 06/30/2016 CLINICAL DATA:  Dysarthria.  Right-sided facial droop. EXAM: CT PERFUSION BRAIN TECHNIQUE: Multiphase CT imaging of the brain was performed following IV bolus contrast injection. Subsequent parametric perfusion maps were calculated using RAPID software. CONTRAST:  40 mL Isovue 370 COMPARISON:  None. FINDINGS: CT Brain  Perfusion Findings: CBF (<30%) Volume: 31mL Perfusion (Tmax>6.0s) volume: 98mL Mismatch Volume: 47mL Infarction Location:None No significant patient motion is present. Arterial input and venous output functions are excellent. IMPRESSION: Negative CT perfusion exam of the head. No evidence for acute infarct or ischemia. Electronically Signed   By: San Morelle M.D.   On: 06/30/2016 16:54   Ct Head Code Stroke W/o Cm  Addendum Date: 06/30/2016   ADDENDUM REPORT: 06/30/2016 16:06 ADDENDUM: Study discussed by telephone on 06/30/2016 with Dr. Marda Stalker, at 1534 hours. Electronically Signed   By: Genevie Ann M.D.   On: 06/30/2016 16:06   Result Date: 06/30/2016 CLINICAL DATA:  Code stroke. 83 year old male with episode of right leg weakness slurred speech and right facial droop which lasted about 1 hour today. Facial droop persists but other symptoms have resolved. Initial encounter. EXAM: CT HEAD WITHOUT CONTRAST TECHNIQUE: Contiguous axial images were obtained from the base of the skull through  the vertex without intravenous contrast. COMPARISON:  Brain MRI 06/16/2016 and earlier. FINDINGS: Brain: Stable cerebral volume. No midline shift, mass effect, or evidence of intracranial mass lesion. No ventriculomegaly. No acute intracranial hemorrhage identified. Stable gray-white matter differentiation throughout the brain. Chronic white matter hypodensity appears stable. No cortically based acute infarct identified. Vascular: Calcified atherosclerosis at the skull base. No suspicious intracranial vascular hyperdensity. Skull: No acute osseous abnormality identified. Sinuses/Orbits: Visualized paranasal sinuses and mastoids are stable and well pneumatized. Other: No acute orbit or scalp soft tissue findings. ASPECTS ([place] Stroke Program Early CT Score) - Ganglionic level infarction (caudate, lentiform nuclei, internal capsule, insula, M1-M3 cortex): 7 - Supraganglionic infarction (M4-M6 cortex): 3 Total score (0-10 with 10 being normal): 10 IMPRESSION: 1. Stable non contrast CT appearance of the brain, no acute intracranial abnormality. 2. ASPECTS is 10. 3. Chronic white matter changes. Electronically Signed: By: Genevie Ann M.D. On: 06/30/2016 15:24    EKG: Independently reviewed. Sinus rhythm with new T-wave inversion in aVF   Assessment/Plan Dysphagia with hemiplegia affecting dominant side/ Question TIA/Stroke: Acute. Last echocardiogram was performed on 06/17/2016 with EF noted to be 55-60% with grade 1 dFx. - Admit to telemetry bed - Stroke order set initiated - Neuro checks - Check  MRI/MRA head w/o contrast  - PT/OT/Speech to eval and treat - Check Vas carotid U/S  - Recheck echocardiogram if warranted  - Check Hemoglobin A1c and lipid panel in a.m. - ASA - Appreciate neurology consultative services, will follow-up for further recommendations  Previous diet recommendations from hospitalization on 3/14:  Thin liquid;Dysphagia 2 (fine chop) Liquids provided via: Cup;No straw Medication  Administration: Whole meds with puree Supervision: Patient able to self feed;Full supervision/cueing for compensatory strategies Compensations: Slow rate;Small sips/bites;Effortful swallow;Multiple dry swallows after each bite/sip  Recent community-acquired pneumonia: Chest x-ray overall improved. - Continue Mucinex  Paroxysmal atrial fibrillation: Chadsvasc score = 5 - Continue Eliquis  Hyperlipidemia - Continue simvastatin  BPH - Continue Flomax    Abnormal UA - Check urine culture   Normocytic normochromic anemia: Stable - Continue to monitor   GERD with H/O Barrett's esophagus - Pharmacy substitution of Protonix for Nexium   DVT prophylaxis: lovenox   Code Status: Full Family Communication:  Discuss plan of care with patient and family members present at bedside  Disposition Plan: TBD Consults called: Neurology  Admission status: Full  Norval Morton MD [facility] Pager [phone]  If 7PM-7AM, please contact night-coverage [email or web address] Password [email or web address]  06/30/2016, 7:50 PM

## 2016-07-01 ENCOUNTER — Observation Stay (HOSPITAL_COMMUNITY): Payer: Medicare Other

## 2016-07-01 ENCOUNTER — Encounter (HOSPITAL_COMMUNITY): Payer: Self-pay | Admitting: Radiology

## 2016-07-01 ENCOUNTER — Inpatient Hospital Stay (HOSPITAL_COMMUNITY): Payer: Medicare Other

## 2016-07-01 DIAGNOSIS — Z96652 Presence of left artificial knee joint: Secondary | ICD-10-CM | POA: Diagnosis present

## 2016-07-01 DIAGNOSIS — R1312 Dysphagia, oropharyngeal phase: Secondary | ICD-10-CM | POA: Diagnosis not present

## 2016-07-01 DIAGNOSIS — I509 Heart failure, unspecified: Secondary | ICD-10-CM | POA: Diagnosis not present

## 2016-07-01 DIAGNOSIS — I69392 Facial weakness following cerebral infarction: Secondary | ICD-10-CM | POA: Diagnosis not present

## 2016-07-01 DIAGNOSIS — E669 Obesity, unspecified: Secondary | ICD-10-CM | POA: Diagnosis present

## 2016-07-01 DIAGNOSIS — I639 Cerebral infarction, unspecified: Secondary | ICD-10-CM | POA: Diagnosis present

## 2016-07-01 DIAGNOSIS — I69322 Dysarthria following cerebral infarction: Secondary | ICD-10-CM | POA: Diagnosis not present

## 2016-07-01 DIAGNOSIS — I6300 Cerebral infarction due to thrombosis of unspecified precerebral artery: Secondary | ICD-10-CM | POA: Diagnosis not present

## 2016-07-01 DIAGNOSIS — E785 Hyperlipidemia, unspecified: Secondary | ICD-10-CM

## 2016-07-01 DIAGNOSIS — Z8551 Personal history of malignant neoplasm of bladder: Secondary | ICD-10-CM | POA: Diagnosis not present

## 2016-07-01 DIAGNOSIS — K2270 Barrett's esophagus without dysplasia: Secondary | ICD-10-CM | POA: Diagnosis present

## 2016-07-01 DIAGNOSIS — K219 Gastro-esophageal reflux disease without esophagitis: Secondary | ICD-10-CM | POA: Diagnosis present

## 2016-07-01 DIAGNOSIS — Z4682 Encounter for fitting and adjustment of non-vascular catheter: Secondary | ICD-10-CM | POA: Diagnosis not present

## 2016-07-01 DIAGNOSIS — R1310 Dysphagia, unspecified: Secondary | ICD-10-CM | POA: Diagnosis not present

## 2016-07-01 DIAGNOSIS — N400 Enlarged prostate without lower urinary tract symptoms: Secondary | ICD-10-CM | POA: Diagnosis present

## 2016-07-01 DIAGNOSIS — I739 Peripheral vascular disease, unspecified: Secondary | ICD-10-CM | POA: Diagnosis present

## 2016-07-01 DIAGNOSIS — D649 Anemia, unspecified: Secondary | ICD-10-CM | POA: Diagnosis present

## 2016-07-01 DIAGNOSIS — R471 Dysarthria and anarthria: Secondary | ICD-10-CM

## 2016-07-01 DIAGNOSIS — Z859 Personal history of malignant neoplasm, unspecified: Secondary | ICD-10-CM

## 2016-07-01 DIAGNOSIS — K449 Diaphragmatic hernia without obstruction or gangrene: Secondary | ICD-10-CM | POA: Diagnosis not present

## 2016-07-01 DIAGNOSIS — N183 Chronic kidney disease, stage 3 (moderate): Secondary | ICD-10-CM | POA: Diagnosis not present

## 2016-07-01 DIAGNOSIS — G8190 Hemiplegia, unspecified affecting unspecified side: Secondary | ICD-10-CM | POA: Diagnosis not present

## 2016-07-01 DIAGNOSIS — M1990 Unspecified osteoarthritis, unspecified site: Secondary | ICD-10-CM | POA: Diagnosis not present

## 2016-07-01 DIAGNOSIS — K5900 Constipation, unspecified: Secondary | ICD-10-CM | POA: Diagnosis not present

## 2016-07-01 DIAGNOSIS — C679 Malignant neoplasm of bladder, unspecified: Secondary | ICD-10-CM | POA: Diagnosis not present

## 2016-07-01 DIAGNOSIS — R8290 Unspecified abnormal findings in urine: Secondary | ICD-10-CM | POA: Diagnosis present

## 2016-07-01 DIAGNOSIS — Z7901 Long term (current) use of anticoagulants: Secondary | ICD-10-CM | POA: Diagnosis not present

## 2016-07-01 DIAGNOSIS — R262 Difficulty in walking, not elsewhere classified: Secondary | ICD-10-CM | POA: Diagnosis not present

## 2016-07-01 DIAGNOSIS — K22719 Barrett's esophagus with dysplasia, unspecified: Secondary | ICD-10-CM | POA: Diagnosis not present

## 2016-07-01 DIAGNOSIS — Z9049 Acquired absence of other specified parts of digestive tract: Secondary | ICD-10-CM | POA: Diagnosis not present

## 2016-07-01 DIAGNOSIS — Z8546 Personal history of malignant neoplasm of prostate: Secondary | ICD-10-CM | POA: Diagnosis not present

## 2016-07-01 DIAGNOSIS — Z9181 History of falling: Secondary | ICD-10-CM | POA: Diagnosis not present

## 2016-07-01 DIAGNOSIS — I454 Nonspecific intraventricular block: Secondary | ICD-10-CM | POA: Diagnosis present

## 2016-07-01 DIAGNOSIS — I6302 Cerebral infarction due to thrombosis of basilar artery: Secondary | ICD-10-CM

## 2016-07-01 DIAGNOSIS — R2689 Other abnormalities of gait and mobility: Secondary | ICD-10-CM | POA: Diagnosis not present

## 2016-07-01 DIAGNOSIS — H04129 Dry eye syndrome of unspecified lacrimal gland: Secondary | ICD-10-CM | POA: Diagnosis not present

## 2016-07-01 DIAGNOSIS — R4781 Slurred speech: Secondary | ICD-10-CM | POA: Diagnosis present

## 2016-07-01 DIAGNOSIS — I480 Paroxysmal atrial fibrillation: Secondary | ICD-10-CM | POA: Diagnosis not present

## 2016-07-01 DIAGNOSIS — R4701 Aphasia: Secondary | ICD-10-CM | POA: Diagnosis present

## 2016-07-01 DIAGNOSIS — H3530 Unspecified macular degeneration: Secondary | ICD-10-CM | POA: Diagnosis present

## 2016-07-01 DIAGNOSIS — G8191 Hemiplegia, unspecified affecting right dominant side: Secondary | ICD-10-CM | POA: Diagnosis present

## 2016-07-01 DIAGNOSIS — E876 Hypokalemia: Secondary | ICD-10-CM | POA: Diagnosis present

## 2016-07-01 DIAGNOSIS — M6281 Muscle weakness (generalized): Secondary | ICD-10-CM | POA: Diagnosis not present

## 2016-07-01 DIAGNOSIS — G4700 Insomnia, unspecified: Secondary | ICD-10-CM | POA: Diagnosis not present

## 2016-07-01 DIAGNOSIS — R278 Other lack of coordination: Secondary | ICD-10-CM | POA: Diagnosis not present

## 2016-07-01 DIAGNOSIS — Z6831 Body mass index (BMI) 31.0-31.9, adult: Secondary | ICD-10-CM | POA: Diagnosis not present

## 2016-07-01 DIAGNOSIS — I10 Essential (primary) hypertension: Secondary | ICD-10-CM | POA: Diagnosis present

## 2016-07-01 DIAGNOSIS — I69351 Hemiplegia and hemiparesis following cerebral infarction affecting right dominant side: Secondary | ICD-10-CM | POA: Diagnosis not present

## 2016-07-01 DIAGNOSIS — J189 Pneumonia, unspecified organism: Secondary | ICD-10-CM | POA: Diagnosis not present

## 2016-07-01 LAB — LIPID PANEL
Cholesterol: 123 mg/dL (ref 0–200)
HDL: 45 mg/dL (ref >40)
LDL CALC: 68 mg/dL (ref 0–99)
Total CHOL/HDL Ratio: 2.7 RATIO
Triglycerides: 52 mg/dL (ref <150)
VLDL: 10 mg/dL (ref 0–40)

## 2016-07-01 LAB — PSA: PSA: 2.08 ng/mL (ref 0.00–4.00)

## 2016-07-01 LAB — VITAMIN B12: Vitamin B-12: 1101 pg/mL — ABNORMAL HIGH (ref 180–914)

## 2016-07-01 MED ORDER — IOPAMIDOL (ISOVUE-300) INJECTION 61%
INTRAVENOUS | Status: AC
  Administered 2016-07-01: 100 mL
  Filled 2016-07-01: qty 100

## 2016-07-01 MED ORDER — ENOXAPARIN SODIUM 100 MG/ML ~~LOC~~ SOLN
1.0000 mg/kg | Freq: Two times a day (BID) | SUBCUTANEOUS | Status: DC
  Administered 2016-07-01 – 2016-07-06 (×10): 100 mg via SUBCUTANEOUS
  Filled 2016-07-01 (×11): qty 1

## 2016-07-01 MED ORDER — IOPAMIDOL (ISOVUE-300) INJECTION 61%
INTRAVENOUS | Status: AC
  Filled 2016-07-01: qty 30

## 2016-07-01 NOTE — Progress Notes (Signed)
Physical medicine rehabilitation consult requested chart reviewed. Patient is currently a resident of [facility] skilled [facility] facility where he has been since 06/20/2016 after episode of pneumonia complicated by multiple medical issues. He is close to his baseline from previous admission ambulated down the hallway with minimal assistance. Discussed with patient and daughter at length in regards to ongoing rehabilitation and feel returning back to [facility] for ongoing therapies is in the best interest and family fully agrees with this and have become quite comfortable with skilled nursing facility. Hold on formal rehabilitation consult at this time with recommendations to return to skilled nursing facility.

## 2016-07-01 NOTE — Progress Notes (Signed)
Modified Barium Swallow Progress Note  Patient Details  Name: Jason [name] MRN: [id number] Date of Birth: 1928/01/07  Today's Date: 07/01/2016  Modified Barium Swallow completed.  Full report located under Chart Review in the Imaging Section.  Brief recommendations include the following:  Clinical Impression  Pt has had a decline in swallow function this admission following acute pontine CVA. Impairments primarily motor based with decreased laryngeal elevation, laryngeal closure and reduced tongue base retraction. Silent penetration and aspiration during initial and multiple swallows with nectar and honey thick consistencies. Chin tuck was ineffective to mitigate laryngeal invasion or to decrease vallecular residue during swallow. Pt required multiple verbal prompts for cough/throat clear which were weak and unable to clear vestibule. Although puree was not penetrated or aspirated, volume was significant and remained increasing risk. MBS does not diagnose below the level of the UES, however esophageal scan revealed stasis in distal esophagus. Recommend NPO status, oral care, short term alternative nutrition and continue ST intervention. From ST/swallow standpoint, pt is an excellent candidate for inpatient rehab.     Swallow Evaluation Recommendations       SLP Diet Recommendations: NPO       Medication Administration: Via alternative means               Oral Care Recommendations: Oral care QID        Jason [name] 07/01/2016,11:27 AM   Jason [name] Caroli[employer] [phone]

## 2016-07-01 NOTE — Evaluation (Signed)
Occupational Therapy Evaluation Patient Details Name: Jason [name] MRN: [id number] DOB: 14-Apr-1927 Today's Date: 07/01/2016    History of Present Illness Jason [name] is a 83 y.o. male with medical history significant for Paroxysmal AFib on eliquis, GERD, Barrett's esophagus, bladder CA, non-Hodgkin's lymphoma, OA, hyperlipidemia who presented in ED on 06/30/16 with acute onset of slurred speech, right-sided facial droop, and right sided upper and lower extremity weakness. He had just been recently hospitalized from 3/14 -3/19 for community-acquired pneumonia. Upon discharge he was sent to a skilled nursing facility for rehabilitation. 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 No hemorrhage.   Clinical Impression   Pt from SNF with new pons infarct and increased deficits (Please see OT problem list below). Pt with visual deficits that need to be investigated further (please see vision section below). Pt required mod cueing throughout session for ADL completion as well as safety during mobility with RW. Pt very pleasant and agreeable, excited to work with therapy. Pt will benefit from skilled OT in the acute setting and will require CIR level therapy to maximize safety and independence in ADL and functional transfers.     Follow Up Recommendations  CIR    Equipment Recommendations  Other (comment) (defer to next venue)    Recommendations for Other Services Rehab consult     Precautions / Restrictions Precautions Precautions: Fall Restrictions Weight Bearing Restrictions: No      Mobility Bed Mobility Overal bed mobility: Needs Assistance Bed Mobility: Supine to Sit     Supine to sit: Mod assist;HOB elevated     General bed mobility comments: pt transferred to sitting on L side of bed with use of bed rails and mod A at trunk with use of bed pads to achieve sitting EOB  Transfers Overall transfer level: Needs assistance Equipment used:  Rolling walker (2 wheeled) Transfers: Sit to/from Stand Sit to Stand: Min assist         General transfer comment: increased time, VC'ing for bilateral hand placement and min A to rise from bed    Balance Overall balance assessment: Needs assistance Sitting-balance support: Feet supported;No upper extremity supported Sitting balance-Leahy Scale: Fair     Standing balance support: Bilateral upper extremity supported Standing balance-Leahy Scale: Poor Standing balance comment: pt reliant on bilateral UEs on RW                           ADL either performed or assessed with clinical judgement   ADL Overall ADL's : Needs assistance/impaired Eating/Feeding: NPO   Grooming: Set up;Wash/dry face;Sitting (able to don chap stick)   Upper Body Bathing: Minimal assistance;Sitting   Lower Body Bathing: Moderate assistance;Sitting/lateral leans   Upper Body Dressing : Minimal assistance;Sitting   Lower Body Dressing: Maximal assistance;Sit to/from stand Lower Body Dressing Details (indicate cue type and reason): for underwear adjustment (to pull up) Toilet Transfer: Moderate assistance;Ambulation;RW;BSC;Cueing for sequencing (BSC over toilet) Toilet Transfer Details (indicate cue type and reason): simulated with recliner Toileting- Clothing Manipulation and Hygiene: Maximal assistance;Sit to/from stand       Functional mobility during ADLs: Moderate assistance;Rolling walker (mod A for power up) General ADL Comments: Pt states that he was getting more and more indpendent at rehab.     Vision Baseline Vision/History: Wears glasses Wears Glasses: Reading only Patient Visual Report: No change from baseline Vision Assessment?: Yes Eye Alignment: Within Functional Limits (denies any double vision) Ocular Range  of Motion: Within Functional Limits Alignment/Gaze Preference: Chin down;Head tilt Tracking/Visual Pursuits: Able to track stimulus in all quads without  difficulty Convergence: Within functional limits Visual Fields: Impaired-to be further tested in functional context;Other (comment) Additional Comments: during peripheral vision Pt unable to see items coming from the upper left side, not acknowledging until midline. However Pt able to track and hold from front     Perception     Praxis      Pertinent Vitals/Pain Pain Assessment: No/denies pain     Hand Dominance Right   Extremity/Trunk Assessment Upper Extremity Assessment Upper Extremity Assessment: Defer to OT evaluation RUE Deficits / Details: decreased grip strength and lag when attempting to hold 90 degrees of forward flexion at shoulder, able to manipulate chapstick (open and close) RUE Coordination: decreased fine motor (weakened gross motor)   Lower Extremity Assessment Lower Extremity Assessment: Overall WFL for tasks assessed (Strength 5/5 bilaterally except for R hip flexor 4/5)   Cervical / Trunk Assessment Cervical / Trunk Assessment: Kyphotic   Communication Communication Communication: HOH;Other (comment);No difficulties (slow to respond to questions)   Cognition Arousal/Alertness: Awake/alert Behavior During Therapy: WFL for tasks assessed/performed Overall Cognitive Status: Impaired/Different from baseline Area of Impairment: Following commands;Safety/judgement;Awareness;Attention                   Current Attention Level: Sustained   Following Commands: Follows one step commands with increased time Safety/Judgement: Decreased awareness of safety;Decreased awareness of deficits Awareness: Emergent       General Comments  Daughter (POA) present for sesion    Exercises Exercises: General Lower Extremity General Exercises - Lower Extremity Ankle Circles/Pumps: AROM;Both;20 reps;Seated Long Arc Quad: AROM;Strengthening;Both;10 reps;Seated Hip Flexion/Marching: AROM;Strengthening;Both;10 reps;Seated   Shoulder Instructions      Home Living  Family/patient expects to be discharged to:: Skilled nursing facility Living Arrangements: Alone Available Help at Discharge: Family;Available PRN/intermittently (Daughter (POA) is a Print production planner in [place]) Type of Home: House Home Access: Stairs to enter [employer] of Steps: 1   Home Layout: One level     Bathroom Shower/Tub: Occupational psychologist: [place]: Environmental consultant - 2 wheels;Bedside commode   Additional Comments: Was in rehab at [facility] when most recent incident occured      Prior Functioning/Environment Level of Independence: Needs assistance  Gait / Transfers Assistance Needed: used a RW ADL's / Homemaking Assistance Needed: assist bathing and dressing (sounds like supervision level   Comments: used a SPC for mobility        OT Problem List: Decreased strength;Decreased activity tolerance;Impaired balance (sitting and/or standing);Impaired vision/perception;Decreased cognition;Decreased knowledge of use of DME or AE;Decreased safety awareness      OT Treatment/Interventions: Self-care/ADL training;Therapeutic exercise;DME and/or AE instruction;Therapeutic activities;Cognitive remediation/compensation;Visual/perceptual remediation/compensation;Patient/family education;Balance training    OT Goals(Current goals can be found in the care plan section) Acute Rehab OT Goals Patient Stated Goal: To get better OT Goal Formulation: With patient Time For Goal Achievement: 07/15/16 Potential to Achieve Goals: Good ADL Goals Pt Will Perform Grooming: with supervision;standing (with focus on grooming that requires BUE (i.e. oral care)) Pt Will Perform Upper Body Bathing: with supervision;sitting Pt Will Perform Lower Body Bathing: with supervision;with adaptive equipment;sit to/from stand Pt Will Transfer to Toilet: with supervision;ambulating (BSc over toilet; with RW) Pt Will Perform Toileting - Clothing Manipulation and  hygiene: with min guard assist;sit to/from stand  OT Frequency: Min 3X/week   Barriers to D/C: Other (comment) (Excellent family support)  Daughter lives in [place]       Co-evaluation PT/OT/SLP Co-Evaluation/Treatment: Yes Reason for Co-Treatment: For patient/therapist safety;To address functional/ADL transfers;Necessary to address cognition/behavior during functional activity   OT goals addressed during session: ADL's and self-care      End of Session Equipment Utilized During Treatment: Gait belt;Rolling walker Nurse Communication: Mobility status  Activity Tolerance: Patient tolerated treatment well Patient left: in chair;with chair alarm set;with family/visitor present  OT Visit Diagnosis: Unsteadiness on feet (R26.81);Muscle weakness (generalized) (M62.81);Other symptoms and signs involving the nervous system (R29.898);Other symptoms and signs involving cognitive function                Time: 3235-5732 OT Time Calculation (min): 38 min Charges:  OT General Charges $OT Visit: 1 Procedure OT Evaluation $OT Eval Moderate Complexity: 1 Procedure G-Codes: OT G-codes **NOT FOR INPATIENT CLASS** Functional Assessment Tool Used: AM-PAC 6 Clicks Daily Activity Functional Limitation: Self care Self Care Current Status (K0254): At least 40 percent but less than 60 percent impaired, limited or restricted Self Care Goal Status (Y7062): At least 20 percent but less than 40 percent impaired, limited or restricted   Hulda Humphrey OTR/L [place] 07/01/2016, 12:19 PM

## 2016-07-01 NOTE — Evaluation (Signed)
Physical Therapy Evaluation Patient Details Name: Jason [name] MRN: [id number] DOB: December 26, 1927 Today's Date: 07/01/2016   History of Present Illness  Jason [name] is a 83 y.o. male with medical history significant for Paroxysmal AFib on eliquis, GERD, Barrett's esophagus, bladder CA, non-Hodgkin's lymphoma, OA, hyperlipidemia who presented in ED on 06/30/16 with acute onset of slurred speech, right-sided facial droop, and right sided upper and lower extremity weakness. He had just been recently hospitalized from 3/14 -3/19 for community-acquired pneumonia. Upon discharge he was sent to a skilled nursing facility for rehabilitation. 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 No hemorrhage.  Clinical Impression  Pt admitted with above diagnosis. Pt currently with functional limitations due to the deficits listed below (see PT Problem List). At the time of PT eval pt showing improvement in symptoms compared to initial presentation to the hospital. Continues to demonstrate balance deficits, with decreased vision and L side inattention. Pt will benefit from skilled PT to increase their independence and safety with mobility to allow discharge to the venue listed below.       Follow Up Recommendations CIR;Supervision/Assistance - 24 hour    Equipment Recommendations  None recommended by PT    Recommendations for Other Services Rehab consult     Precautions / Restrictions Precautions Precautions: Fall Restrictions Weight Bearing Restrictions: No      Mobility  Bed Mobility Overal bed mobility: Needs Assistance Bed Mobility: Supine to Sit     Supine to sit: Mod assist;HOB elevated     General bed mobility comments: pt transferred to sitting on L side of bed with use of bed rails and mod A at trunk with use of bed pads to achieve sitting EOB  Transfers Overall transfer level: Needs assistance Equipment used: Rolling walker (2 wheeled) Transfers: Sit  to/from Stand Sit to Stand: Min assist         General transfer comment: increased time, VC'ing for bilateral hand placement and min A to rise from bed  Ambulation/Gait Ambulation/Gait assistance: Min guard;Min assist Ambulation Distance (Feet): 150 Feet Assistive device: Rolling walker (2 wheeled) Gait Pattern/deviations: Step-through pattern;Decreased stride length;Trunk flexed Gait velocity: decreased Gait velocity interpretation: Below normal speed for age/gender General Gait Details: Pt with frequent cues to improve posture and attain a forward gaze. Decreased awareness of energy conservation. LE's getting obviously fatigued but pt stating he felt fine. Noted several instances of LLE buckling on the way back to the room  Stairs            Wheelchair Mobility    Modified Rankin (Stroke Patients Only) Modified Rankin (Stroke Patients Only) Pre-Morbid Rankin Score: Moderately severe disability Modified Rankin: Moderately severe disability     Balance Overall balance assessment: Needs assistance Sitting-balance support: Feet supported;No upper extremity supported Sitting balance-Leahy Scale: Fair     Standing balance support: Bilateral upper extremity supported Standing balance-Leahy Scale: Poor Standing balance comment: pt reliant on bilateral UEs on RW                             Pertinent Vitals/Pain Pain Assessment: No/denies pain    Home Living Family/patient expects to be discharged to:: Skilled nursing facility Living Arrangements: Alone Available Help at Discharge: Family;Available PRN/intermittently (Daughter (POA) is a Print production planner in [place]) Type of Home: House Home Access: Stairs to enter   [employer] of Steps: 1 Home Layout: One level Home Equipment: Environmental consultant - 2  wheels;Bedside commode Additional Comments: Was in rehab at [facility] when most recent incident occured    Prior Function Level of Independence: Needs  assistance   Gait / Transfers Assistance Needed: used a RW  ADL's / Homemaking Assistance Needed: assist bathing and dressing (sounds like supervision level  Comments: used a SPC for mobility     Hand Dominance   Dominant Hand: Right    Extremity/Trunk Assessment   Upper Extremity Assessment Upper Extremity Assessment: Defer to OT evaluation RUE Deficits / Details: decreased grip strength and lag when attempting to hold 90 degrees of forward flexion at shoulder, able to manipulate chapstick (open and close) RUE Coordination: decreased fine motor (weakened gross motor)    Lower Extremity Assessment Lower Extremity Assessment: Overall WFL for tasks assessed (Strength 5/5 bilaterally except for R hip flexor 4/5)    Cervical / Trunk Assessment Cervical / Trunk Assessment: Kyphotic  Communication   Communication: HOH;Other (comment);No difficulties (slow to respond to questions)  Cognition Arousal/Alertness: Awake/alert Behavior During Therapy: WFL for tasks assessed/performed Overall Cognitive Status: Impaired/Different from baseline Area of Impairment: Following commands;Safety/judgement;Awareness;Attention                   Current Attention Level: Sustained   Following Commands: Follows one step commands with increased time Safety/Judgement: Decreased awareness of safety;Decreased awareness of deficits Awareness: Emergent          General Comments General comments (skin integrity, edema, etc.): Daughter (POA) present for sesion    Exercises     Assessment/Plan    PT Assessment Patient needs continued PT services  PT Problem List Decreased strength;Decreased activity tolerance;Decreased balance;Decreased mobility;Decreased coordination;Decreased knowledge of use of DME       PT Treatment Interventions Gait training;DME instruction;Functional mobility training;Therapeutic activities;Therapeutic exercise;Patient/family education    PT Goals (Current goals  can be found in the Care Plan section)  Acute Rehab PT Goals Patient Stated Goal: To get better PT Goal Formulation: With patient Time For Goal Achievement: 07/15/16 Potential to Achieve Goals: Good    Frequency Min 3X/week   Barriers to discharge        Co-evaluation PT/OT/SLP Co-Evaluation/Treatment: Yes Reason for Co-Treatment: For patient/therapist safety;To address functional/ADL transfers;Necessary to address cognition/behavior during functional activity PT goals addressed during session: Mobility/safety with mobility;Balance;Proper use of DME OT goals addressed during session: ADL's and self-care       End of Session Equipment Utilized During Treatment: Gait belt Activity Tolerance: Patient tolerated treatment well Patient left: in chair;with call bell/phone within reach;with family/visitor present;with chair alarm set Nurse Communication: Mobility status PT Visit Diagnosis: Unsteadiness on feet (R26.81);Other abnormalities of gait and mobility (R26.89)    Time: 1000-1035 PT Time Calculation (min) (ACUTE ONLY): 35 min   Charges:   PT Evaluation $PT Eval Moderate Complexity: 1 Procedure     PT G Codes:   PT G-Codes **NOT FOR INPATIENT CLASS** Functional Assessment Tool Used: AM-PAC 6 Clicks Basic Mobility Functional Limitation: Mobility: Walking and moving around Mobility: Walking and Moving Around Current Status (L8453): At least 40 percent but less than 60 percent impaired, limited or restricted Mobility: Walking and Moving Around Goal Status [phone]): At least 20 percent but less than 40 percent impaired, limited or restricted    Rolinda Roan, PT, DPT Acute Rehabilitation Services Pager: [place] 07/01/2016, 12:43 PM

## 2016-07-01 NOTE — Progress Notes (Signed)
Per Dr. Allyson Sabal, no PO contrast needed for ct chest, abd, pelvis

## 2016-07-01 NOTE — Progress Notes (Signed)
Inpatient Rehabilitation  Per PT and SLP request patient was screened by Gunnar Fusi for appropriateness for an Inpatient Acute Rehab consult.  At this time we are recommending an Inpatient Rehab consult.  MD test paged; please order if you are agreeable.    Carmelia Roller., CCC/SLP Admission Coordinator  [facility]  Cell [phone]

## 2016-07-01 NOTE — Procedures (Signed)
Pt transported to MRI for test. Unavailable for 0600 Vital Signs.

## 2016-07-01 NOTE — Progress Notes (Signed)
Per MD order, meds given in applesauce, otherwise pt NPO until swallow evaluation completed.

## 2016-07-01 NOTE — Progress Notes (Signed)
STROKE TEAM PROGRESS NOTE   HISTORY OF PRESENT ILLNESS (per record) Jason [name] is an 83 y.o. male who was seen one weak ago for swallowing difficulties and generalized weakness. At that time " I spoke to the daughter in depth. Apparently he has been having difficulty with swallowing and choking on his food. He has been seen by his PCP and they thought it might be his records esophagus. Patient is also been noticing to drool on the left side of his mouth more than usual. They attributed that also to his Bell's palsy. It was noted that his his mother did have Parkinson's however naming the symptoms of Parkinson's patient's daughter did not note any significant symptoms that jumped out to her. She has noticed that while he eats he often tires out while chewing, in addition he also will have a hard time holding his head up for prolonged periods of time." Speech had seen patient at that time and noted "Oral function chaacteraized by mild lingual weakness observed via mild lingual residuals, with functional abiltiy to masticate and transit soft solids (though endurance not taxed). Flash penetration and silent aspriation during the swallow occured with boluses larger than 43ml (estimate) due to slow laryngeal closure and epiglottic deflection. Pt was able trigger a timely swallow and eventually achieve full laryngeal closure which expelled penetrate when possible, but again sluggish movement kept vestible partialy open during bolus passage. Further silent aspiration events also occured after the swallow if pt was cued to cough, which spilled valleuular residual into the airway. Attempted chin tuck without improvement and mendelsohn with visual biofeedback (pt unable after 5 attempts with max cues). Best methods included verbal cues for small sips (bolus size consistently comparable in appearance and fucntion with 85mL teaspoon), oral hold and effortful swallow x2. "  Today he was noted to have a sudden right facial  droop, worsening of dysarthria and right leg plegia.   Neurology was called and patient was brought for a emergent CT perfusion to evaluate for M1 stenosis or occlusion.   Of note he had a Myasthenia panel obtained on previous hospitalization which was negative.   He was last known well 06/30/2016 at 11:30a. Patient was not administered IV t-PA secondary to out of window. He was admitted for further evaluation and treatment.   SUBJECTIVE (INTERVAL HISTORY) Two daughters at bedside. They recounted HPI with me. He had left mouth drooling for some time, may be two years now. No sure why. Apparently, pt had swallow problems since 6 months ago, chocking and difficulty swallow. Had no stroke work up but swallow evaluation at that time. Last admission, continue to have swallow difficulty but MRI negative. This time right facial droop, right UE and LE weakness yesterday but much improved in ED. MRI today showed left pontine infarct.     OBJECTIVE Temp:  [97.5 F (36.4 C)-99 F (37.2 C)] 98.6 F (37 C) (03/30 0753) Pulse Rate:  [55-69] 63 (03/30 0753) Cardiac Rhythm: Normal sinus rhythm;Bundle branch block (03/29 2154) Resp:  [13-24] 16 (03/30 0400) BP: (129-163)/(55-75) 153/75 (03/30 0753) SpO2:  [96 %-100 %] 96 % (03/30 0753) Weight:  [100.4 kg (221 lb 4.8 oz)-105.7 kg (233 lb)] 100.4 kg (221 lb 4.8 oz) (03/29 2155)  CBC:  Recent Labs Lab 06/30/16 1524 06/30/16 1530  WBC 8.2  --   NEUTROABS 5.8  --   HGB 12.1* 12.9*  HCT 38.3* 38.0*  MCV 85.5  --   PLT 194  --  Basic Metabolic Panel:  Recent Labs Lab 06/30/16 1524 06/30/16 1530  NA 140 142  K 3.9 3.8  CL 106 105  CO2 25  --   GLUCOSE 103* 98  BUN 28* 30*  CREATININE 1.21 1.10  CALCIUM 9.1  --     Lipid Panel: No results found for: CHOL, TRIG, HDL, CHOLHDL, VLDL, LDLCALC HgbA1c: No results found for: HGBA1C Urine Drug Screen:    Component Value Date/Time   LABOPIA NONE DETECTED 06/15/2016 0856   COCAINSCRNUR  NONE DETECTED 06/15/2016 0856   LABBENZ NONE DETECTED 06/15/2016 0856   AMPHETMU NONE DETECTED 06/15/2016 0856   THCU NONE DETECTED 06/15/2016 0856   LABBARB NONE DETECTED 06/15/2016 0856      IMAGING I have personally reviewed the radiological images below and agree with the radiology interpretations.  Ct Head Code Stroke W/o Cm 06/30/2016  1. Stable non contrast CT appearance of the brain, no acute intracranial abnormality. 2. ASPECTS is 10. 3. Chronic white matter changes.   Ct Angio Head W Or Wo Contrast Ct Angio Neck W Or Wo Contrast 06/30/2016 1. No emerging large vessel occlusion. 2. Atherosclerotic changes at the aortic arch, carotid bifurcations, dural margin of vertebral arteries, and cavernous internal carotid arteries without significant stenoses. 3. Mild diffuse segmental branch vessel irregularity within both the anterior and posterior circulation suggesting intracranial atherosclerotic change. There is no significant proximal stenosis or occlusion. 4. Multilevel spondylosis of the cervical spine.   Ct Cerebral Perfusion W Contrast 06/30/2016 Negative CT perfusion exam of the head. No evidence for acute infarct or ischemia.   Mr Brain Wo Contrast 07/01/2016 Acute infarction affecting the left para median pons. No hemorrhage. Chronic small-vessel ischemic changes elsewhere as outlined above.   Dg Chest 2 View 06/30/2016 There has been some improvement in the appearance of the lungs especially upper lobes but coarse interstitial markings persist in the lower lobes. There is no discrete pneumonia. There is stable cardiomegaly without pulmonary vascular congestion. Thoracic aortic atherosclerosis.   TTE 06/17/16 - Technically difficult study with poor acoustic windows. Normal LV   size with severe LV hypertrophy. EF 55-60. Mildly dilated RV with   normal systolic function. No significant valvular abnormalities.   PHYSICAL EXAM  Temp:  [97.5 F (36.4 C)-99 F (37.2 C)]  98.2 F (36.8 C) (03/30 1512) Pulse Rate:  [55-72] 67 (03/30 1512) Resp:  [13-24] 19 (03/30 1512) BP: (133-163)/(57-75) 162/71 (03/30 1512) SpO2:  [96 %-100 %] 98 % (03/30 1512) Weight:  [221 lb 4.8 oz (100.4 kg)] 221 lb 4.8 oz (100.4 kg) (03/29 2155)  General - Well nourished, well developed, in no apparent distress.  Ophthalmologic - Fundi not visualized due to noncooperation.  Cardiovascular - Regular rate and rhythm, not in afib.  Mental Status -  Level of arousal and orientation to time, place, and person were intact. Language including expression, naming, repetition, comprehension was assessed and found intact, however, dysarthria. Fund of Knowledge was assessed and was intact.  Cranial Nerves II - XII - II - Visual field intact OU. III, IV, VI - Extraocular movements intact. V - Facial sensation intact bilaterally. VII - right nasolabial fold flattening. VIII - Hearing & vestibular intact bilaterally. X - Palate elevates symmetrically, mild dysarthria. XI - Chin turning & shoulder shrug intact bilaterally. XII - Tongue protrusion intact.  Motor Strength - The patient's strength was normal in all extremities and pronator drift was absent.  Bulk was normal and fasciculations were absent.   Motor Tone -  Muscle tone was assessed at the neck and appendages and was normal.  Reflexes - The patient's reflexes were 1+ in all extremities and he had no pathological reflexes.  Sensory - Light touch, temperature/pinprick were assessed and were symmetrical.    Coordination - The patient had normal movements in the hands with no ataxia or dysmetria.  Tremor was absent.  Gait and Station - able to walk with walker, slow, small stride, mildly stooped posturing, no fall tendency.   ASSESSMENT/PLAN Mr. BURNICE OESTREICHER is a 83 y.o. male with history of PAF on Eliquis, Barrett's esophagus, bladder cancer, non-Hodgkin's lymphoma, OA, and HLD presenting with facial droop and right leg  weakness. He did not receive IV t-PA due to being outside the window.   Stroke:   L paramedian pontine infarct secondary to small vessel disease source. However, pt has pAfib on eliquis (compliance). Pt also has multiple cancer history, will check pan CT to rule out advanced malignancy.   Resultant  Dysarthria, and swallowing difficulty  Code Stroke CT no acute stroke. small vessel disease. Aspects 10.   CTA head/neck no ELVO. Atherosclerosis. Multilevel spondylosis  CT Perfusion negative. No acute infarct  MRI  acute L paramedian pontine infarct. small vessel disease.   LE venous doppler 06/15/16 NO DVT. R&L enlarged lymph nodes  2D Echo  06/15/16 severe LVH. EF 55-60%. No valve abnormalities  Pan CT pending to rule out advanced malignancy  LDL 68   HgbA1c pending  UDS negative  lovenox therapeutic dose for VTE prophylaxis  DIET DYS 2 Room service appropriate? Yes with Assist; Fluid consistency: Thin  Eliquis (apixaban) daily prior to admission, now on lovenox therapeutic dose due to NPO status. Once passed swallow or have po access, can switch back to eliquis.  Patient counseled to be compliant with his antithrombotic medications  Ongoing aggressive stroke risk factor management  Therapy recommendations:  pending   Disposition:  pending  (from [facility])  Physical deconditioning, drooling and swallowing difficulty - ? Neuromuscular disorder vs. ? Hx of stroke  Admitted 2 weeks ago and concerning for neuromuscular disease  MG panel negative and mestinon trial negative  Recommended outpt EMG/NCS  Has appointment with Dr. [name] at [facility] on 07/07/16  Pt had left drooling intermittently for the last 2 years  Pt has swallow difficulty for 6 months  CT and MRI showed remote right IC genu infarct and right thalamic punctate infarcts  Wondering whether pt symptoms can be explained by remote infarcts.  Paroxysmal Atrial Fibrillation  Home anticoagulation:  Eliquis  (apixaban) daily continued in the hospital  NPO status, failed MBS  On therapeutic lovenox now   Switch back to eliquis 5mg  bid once passed swallow or po access.  History of multiple cancers  Bladder cancer s/p Sx  Prostate cancer s/p Sx  Stomach lymphoma NHL s/p chemo  No recurrence as pt and family aware  Pan CT pending to rule out advanced malignancy  Hypertension  Stable Permissive hypertension (OK if < 180/105) but gradually normalize in 5-7 days Long-term BP goal normotensive  Hyperlipidemia  Home meds:  zocor 40,resumed in hospital  LDL 68, goal < 70  Statin on hold due to NPO status, resume once pass swallow or have po access  Continue statin at discharge  Other Stroke Risk Factors  Advanced age  Obesity, Body mass index is 31.75 kg/m., recommend weight loss, diet and exercise as appropriate   Other Active Problems  Recent community acquired PNA  BPH on  flomax  Abnormal UA WBC 6-30  Normocytic normochronic anemia  GERD w/ H/O Barrett's esophagus  Pt has follow up appointment with Dr. [name] at [facility] on 07/07/16  Hospital day # 0  Rosalin Hawking, MD PhD Stroke Neurology 07/01/2016 3:47 PM    To contact Stroke Continuity provider, please refer to [email or web address] After hours, contact General Neurology

## 2016-07-01 NOTE — Progress Notes (Signed)
Pt had no new signs or symptoms overnight. No c/o pain overnight.

## 2016-07-01 NOTE — Progress Notes (Signed)
ANTICOAGULATION CONSULT NOTE - Initial Consult  Pharmacy Consult for Lovenox Indication: atrial fibrillation  No Known Allergies  Patient Measurements: Height: 5\' 10"  (177.8 cm) Weight: 221 lb 4.8 oz (100.4 kg) IBW/kg (Calculated) : 73  Vital Signs: Temp: 98.2 F (36.8 C) (03/30 1512) Temp Source: Oral (03/30 1512) BP: 162/71 (03/30 1512) Pulse Rate: 67 (03/30 1512)  Labs:  Recent Labs  06/30/16 1524 06/30/16 1530  HGB 12.1* 12.9*  HCT 38.3* 38.0*  PLT 194  --   APTT 51*  --   LABPROT 19.0*  --   INR 1.58  --   CREATININE 1.21 1.10    Estimated Creatinine Clearance: 57.3 mL/min (by C-G formula based on SCr of 1.1 mg/dL).   Medical History: Past Medical History:  Diagnosis Date  . Arthritis   . Barrett esophagus   . Bladder cancer ([place]) dx'd 3818,2993   surg only  . Colon polyps    adenomatous  . Difficulty sleeping    OCCASIONALLY  . Diverticulosis of colon (without mention of hemorrhage)   . Frequency of urination   . GERD (gastroesophageal reflux disease)   . Hiatal hernia   . History of bladder cancer 1981   EXCISION ONLY  . History of skin cancer    BASAL CELL  . Hyperlipidemia   . Lymphoma, non-Hodgkin's ([place]) 2011   [place]   . Macular degeneration    LEFT EYE  . Multiple nodules of lung 09/16/2011  . PAF (paroxysmal atrial fibrillation) ([place])   . Personal history of colonic polyps 07/15/2010   tubular adenoma  . Prostate cancer [facility])    patient denies  . PUD (peptic ulcer disease)   . Stomach cancer ([place])    NHL origin    Medications:  Prescriptions Prior to Admission  Medication Sig Dispense Refill Last Dose  . apixaban (ELIQUIS) 5 MG TABS tablet Take 5 mg by mouth 2 (two) times daily.   06/30/2016 at 837  . esomeprazole (NEXIUM) 40 MG capsule TAKE 1 CAPSULE (40 MG TOTAL) BY MOUTH 2 (TWO) TIMES DAILY BEFORE A MEAL. 60 capsule 6 06/30/2016 at 0630  . loratadine (CLARITIN) 10 MG tablet Take 10 mg by mouth daily as needed for  allergies.   PRN at PRN  . Multiple Vitamins-Minerals (ICAPS MV PO) Take 1 capsule by mouth 2 (two) times daily.    06/30/2016 at 0837  . Polyethyl Glycol-Propyl Glycol (SYSTANE) 0.4-0.3 % SOLN Place 1 drop into both eyes daily as needed (for dry eyes).    PRN at PRN  . simvastatin (ZOCOR) 40 MG tablet Take 40 mg by mouth at bedtime.     06/29/2016 at 2121  . tamsulosin (FLOMAX) 0.4 MG CAPS capsule Take 0.4 mg by mouth at bedtime.   06/29/2016 at 2121  . traZODone (DESYREL) 50 MG tablet Take 50 mg by mouth at bedtime as needed for sleep.   PRN at PRN  . fish oil-omega-3 fatty acids 1000 MG capsule Take 1 g by mouth 2 (two) times daily.    Not Taking at Unknown time    Assessment: Patient on apixaban PTA for PAF, admitted with acute CVA. Apixaban currently on hold d/t NPO status. Pharmacy consulted to start Lovenox until able to take PO's again. ONCe tolerating PO, can transition back to apixaban.  Goal of Therapy:  Anti-Xa level 0.6-1 units/ml 4hrs after LMWH dose given Monitor platelets by anticoagulation protocol: Yes   Plan:  Lovenox 1 mg/kg (100 mg) q 12 hours Continue  to monitor H&H, platelets, and for signs and symptoms of bleeding F/u NPO status to transition back to apixaban   Thank you for allowing [place] to participate in this patients care.  Jens Som, PharmD If after 3:30p, please call main pharmacy at: [phone] 07/01/2016 3:41 PM

## 2016-07-01 NOTE — Progress Notes (Signed)
[facility] PROGRESS NOTE  Jason [name] HBZ:[id number] DOB: 22-Jan-1928 DOA: 06/30/2016   PCP: Melinda Crutch, MD     Assessment/Plan: Principal Problem:   Hemiplegia affecting dominant side ([place]) Active Problems:   Hyperlipidemia   GERD (gastroesophageal reflux disease)   BPH (benign prostatic hyperplasia)   PAF (paroxysmal atrial fibrillation) (HCC)   Dysphagia   CVA (cerebral vascular accident) ([place])   83 y.o. right handed male with medical history significant of PAF on Eliquis, Barrett's esophagus, bladder cancer, non-Hodgkin's lymphoma, OA, and HLD;  who presents with acute onset of slurred speech, right-sided facial droop, and right sided upper and lower extremity weakness. Patient's daughter helps provide history and states that when she went to visit the patient at the rehabilitation facility she noticed that his speech was significantly slurred to the point where she was unable to understand him.He had just been recently hospitalized from 3/14 -3/19 for community-acquired pneumonia. During hospitalization he had an event of unresponsiveness with bradycardia, but cardiology felt it was noncardiac in nature. Patient had negative MRI, MRA, and EEG studies. Due to swallowing difficulties patient was evaluated by speech therapy and seen to be silently aspirating.Upon discharge he was sent to a skilled nursing facility for rehabilitation and completed a full course of Levaquin on 3/21.Neurology was called and patient was brought for a emergent CT perfusion to evaluate for M1 stenosis or occlusion.   Assessment/Plan Acute CVA-Dysphagia with hemiplegia affecting dominant side/ Question TIA/Stroke: Acute. Last echocardiogram was performed on 06/17/2016 with EF noted to be 55-60% with grade 1 dFx. -Telemetry showed normal sinus rhythm - Stroke order set initiated - Neuro checks MRI brain-Acute infarction affecting the left para median pons - PT/OT/Speech recommends NPO Carotid  ultrasound 2-D echo  showed EF of 55-60% - Check Hemoglobin A1c , LDL 68 Patient is on eliquis , got 1 dose of aspirin yesterday    Previous diet recommendations from hospitalization on 3/14:  Dysphagia 3 (mechanical soft);Thin liquid Liquids provided via: Cup;No straw    Recent community-acquired pneumonia: Chest x-ray overall improved. - Continue Mucinex, completed Levaquin  Paroxysmal atrial fibrillation: Chadsvasc score = 5 - Continue Eliquis  Hyperlipidemia - Continue simvastatin  BPH - Continue Flomax    Abnormal UA - Check urine culture   Normocytic normochromic anemia: Stable - Continue to monitor   GERD with H/O Barrett's esophagus - Pharmacy substitution of Protonix for Nexium    DVT prophylaxsis eliquis  Code Status:   Full code   Family Communication: Discussed in detail with the patient, all imaging results, lab results explained to the patient   Disposition Plan:  SNF likely Monday      Consultants: Neurology  Procedures: None  Antibiotics: Anti-infectives    None         HPI/Subjective:  Patient seen in the presence of his daughter by the bedside, continues to have facial droop and slurred speech  Objective: Vitals:   07/01/16 0200 07/01/16 0400 07/01/16 0753 07/01/16 1015  BP: (!) 161/69 (!) 155/72 (!) 153/75 (!) 155/68  Pulse: 67 63 63 72  Resp: 16 16  18   Temp: 98.6 F (37 C) 97.5 F (36.4 C) 98.6 F (37 C) 98.7 F (37.1 C)  TempSrc: Oral Oral  Oral  SpO2: 97% 98% 96% 97%  Weight:      Height:        Intake/Output Summary (Last 24 hours) at 07/01/16 1020 Last data filed at 07/01/16 0500  Gross per  24 hour  Intake              375 ml  Output                0 ml  Net              375 ml    Exam:  Examination:  General exam: Appears calm and comfortable  Respiratory system: Clear to auscultation. Respiratory effort normal. Cardiovascular system: S1 & S2 heard, RRR. No JVD, murmurs, rubs, gallops or  clicks. No pedal edema. Gastrointestinal system: Abdomen is nondistended, soft and nontender. No organomegaly or masses felt. Normal bowel sounds heard. Central nervous system: Alert and oriented. No focal neurological deficits. Extremities: Symmetric 5 x 5 power. Skin: No rashes, lesions or ulcers Psychiatry: Judgement and insight appear normal. Mood & affect appropriate.     Data Reviewed: I have personally reviewed following labs and imaging studies  Micro Results No results found for this or any previous visit (from the past 240 hour(s)).  Radiology Reports Ct Angio Head W Or Wo Contrast  Result Date: 06/30/2016 CLINICAL DATA:  New onset right-sided facial droop and intermittent dysarthria over the last 2 days. EXAM: CT ANGIOGRAPHY HEAD AND NECK TECHNIQUE: Multidetector CT imaging of the head and neck was performed using the standard protocol during bolus administration of intravenous contrast. Multiplanar CT image reconstructions and MIPs were obtained to evaluate the vascular anatomy. Carotid stenosis measurements (when applicable) are obtained utilizing NASCET criteria, using the distal internal carotid diameter as the denominator. CONTRAST:  50 mL Isovue 370 COMPARISON:  CT head without contrast from the same day. MRI brain 06/16/2016 FINDINGS: CTA NECK FINDINGS Aortic arch: A 3 vessel arch configuration is present. Atherosclerotic changes are present at the aortic arch without significant stenosis at the great vessel origins. There is no aneurysm. Right carotid system: Common carotid artery is tortuous proximally. There is no focal stenosis. Atherosclerotic calcifications are present at the right carotid bifurcation without significant stenosis relative to the more distal vessel. Remainder of the right internal carotid artery is normal. Left carotid system: The left common carotid artery is tortuous proximally. There is no significant stenosis. Atherosclerotic changes are present at the  carotid bifurcation without significant stenosis. The more distal left internal carotid artery is normal. Vertebral arteries: Both vertebral arteries originate from the subclavian arteries without significant stenoses. The vertebral arteries are codominant. There is no significant stenosis or vascular injury to either vertebral artery in the neck. Skeleton: Mild endplate degenerative and facet changes are present. No focal lytic or blastic lesions are present. There is exaggerated thoracic kyphosis and cervical lordosis. Other neck: No focal mucosal or submucosal lesions are present. Bilateral thyroid nodules are present without a dominant lesion. There is no significant adenopathy. The salivary glands are within normal limits. Upper chest: The lung apices demonstrate bilateral dependent atelectasis. No focal nodule or mass lesion is present. Review of the MIP images confirms the above findings CTA HEAD FINDINGS Anterior circulation: Atherosclerotic calcifications are present within the cavernous internal carotid arteries bilaterally without a significant stenosis through the ICA terminus. The A1 and M1 segments are normal. The anterior communicating artery is patent. The MCA bifurcations are intact. There is segmental attenuation of ACA and MCA branch vessels bilaterally without a significant proximal stenosis or occlusion. Posterior circulation: The vertebral arteries are codominant. Atherosclerotic changes are present at the dural margin. The PICA origins are visualized and normal. The basilar artery is normal. Both posterior cerebral  arteries originate from the basilar tip. There is no significant proximal stenosis or occlusion within either posterior cerebral artery. Venous sinuses: The dural sinuses are patent. The right transverse sinus is dominant. The straight sinus deep cerebral veins are intact. The cortical veins are unremarkable. Anatomic variants: None Delayed phase: Not performed. Arterial source  images are unremarkable. Review of the MIP images confirms the above findings IMPRESSION: 1. No emerging large vessel occlusion. 2. Atherosclerotic changes at the aortic arch, carotid bifurcations, dural margin of vertebral arteries, and cavernous internal carotid arteries without significant stenoses. 3. Mild diffuse segmental branch vessel irregularity within both the anterior and posterior circulation suggesting intracranial atherosclerotic change. There is no significant proximal stenosis or occlusion. 4. Multilevel spondylosis of the cervical spine. Electronically Signed   By: San Morelle M.D.   On: 06/30/2016 17:03   Dg Chest 2 View  Result Date: 06/30/2016 CLINICAL DATA:  Cough, recent episode of pneumonia, stroke symptoms. History of bladder malignancy EXAM: CHEST  2 VIEW COMPARISON:  Chest x-ray of June 15, 2016 FINDINGS: The lungs are adequately inflated. The interstitial markings are coarse in the mid and lower lung zones but have improved in the upper lobes. The cardiac silhouette remains enlarged. The pulmonary vascularity is not engorged. There is no pleural effusion. There is calcification in the wall of the thoracic aorta. There is multilevel degenerative disc disease of the thoracic spine. IMPRESSION: There has been some improvement in the appearance of the lungs especially upper lobes but coarse interstitial markings persist in the lower lobes. There is no discrete pneumonia. There is stable cardiomegaly without pulmonary vascular congestion. Thoracic aortic atherosclerosis. Electronically Signed   By: David  Martinique M.D.   On: 06/30/2016 15:47   Dg Chest 2 View  Result Date: 06/15/2016 CLINICAL DATA:  Dizziness and confusion EXAM: CHEST  2 VIEW COMPARISON:  Aug 09, 2013 FINDINGS: There is cardiomegaly with mild pulmonary venous hypertension. There is mild interstitial edema in the lung bases. There is focal airspace consolidation in the medial left base. Lungs elsewhere clear. No  adenopathy. No bone lesions. IMPRESSION: Findings consistent with a degree of congestive heart failure. Suspect superimposed pneumonia medial left base. Followup PA and lateral chest radiographs recommended in 3-4 weeks following trial of antibiotic therapy to ensure resolution and exclude underlying malignancy. Electronically Signed   By: Lowella Grip III M.D.   On: 06/15/2016 07:58   Ct Head Wo Contrast  Result Date: 06/15/2016 CLINICAL DATA:  Lower extremity weakness.  Fall. EXAM: CT HEAD WITHOUT CONTRAST TECHNIQUE: Contiguous axial images were obtained from the base of the skull through the vertex without intravenous contrast. COMPARISON:  Head CT November 15, 2015 FINDINGS: Brain: Moderate diffuse atrophy is stable. There is no intracranial mass, hemorrhage, extra-axial fluid collection, or midline shift. There is mild patchy small vessel disease in the centra semiovale bilaterally. Elsewhere gray-white compartments appear normal. No acute infarct evident. Vascular: No hyperdense vessel. There are foci of calcification in each carotid siphon region as well as in each distal vertebral artery. Skull: The bony calvarium appears intact. Sinuses/Orbits: There is mucosal thickening and opacification in multiple ethmoid air cells. There is mucosal thickening in the anteromedial right sphenoid sinus. Visualized paranasal sinuses elsewhere are clear. Visualized orbits appear symmetric bilaterally with the exception of apparent cataract removal on the right. Other: Mastoid air cells are clear. IMPRESSION: Atrophy with relatively mild periventricular small vessel disease, stable. No intracranial mass, hemorrhage, or extra-axial fluid collection. No acute appearing infarct. Areas of  arterial vascular calcification noted. There is paranasal sinus disease, primarily in multiple ethmoid air cells. Electronically Signed   By: Lowella Grip III M.D.   On: 06/15/2016 08:10   Ct Angio Neck W Or Wo Contrast  Result  Date: 06/30/2016 CLINICAL DATA:  New onset right-sided facial droop and intermittent dysarthria over the last 2 days. EXAM: CT ANGIOGRAPHY HEAD AND NECK TECHNIQUE: Multidetector CT imaging of the head and neck was performed using the standard protocol during bolus administration of intravenous contrast. Multiplanar CT image reconstructions and MIPs were obtained to evaluate the vascular anatomy. Carotid stenosis measurements (when applicable) are obtained utilizing NASCET criteria, using the distal internal carotid diameter as the denominator. CONTRAST:  50 mL Isovue 370 COMPARISON:  CT head without contrast from the same day. MRI brain 06/16/2016 FINDINGS: CTA NECK FINDINGS Aortic arch: A 3 vessel arch configuration is present. Atherosclerotic changes are present at the aortic arch without significant stenosis at the great vessel origins. There is no aneurysm. Right carotid system: Common carotid artery is tortuous proximally. There is no focal stenosis. Atherosclerotic calcifications are present at the right carotid bifurcation without significant stenosis relative to the more distal vessel. Remainder of the right internal carotid artery is normal. Left carotid system: The left common carotid artery is tortuous proximally. There is no significant stenosis. Atherosclerotic changes are present at the carotid bifurcation without significant stenosis. The more distal left internal carotid artery is normal. Vertebral arteries: Both vertebral arteries originate from the subclavian arteries without significant stenoses. The vertebral arteries are codominant. There is no significant stenosis or vascular injury to either vertebral artery in the neck. Skeleton: Mild endplate degenerative and facet changes are present. No focal lytic or blastic lesions are present. There is exaggerated thoracic kyphosis and cervical lordosis. Other neck: No focal mucosal or submucosal lesions are present. Bilateral thyroid nodules are  present without a dominant lesion. There is no significant adenopathy. The salivary glands are within normal limits. Upper chest: The lung apices demonstrate bilateral dependent atelectasis. No focal nodule or mass lesion is present. Review of the MIP images confirms the above findings CTA HEAD FINDINGS Anterior circulation: Atherosclerotic calcifications are present within the cavernous internal carotid arteries bilaterally without a significant stenosis through the ICA terminus. The A1 and M1 segments are normal. The anterior communicating artery is patent. The MCA bifurcations are intact. There is segmental attenuation of ACA and MCA branch vessels bilaterally without a significant proximal stenosis or occlusion. Posterior circulation: The vertebral arteries are codominant. Atherosclerotic changes are present at the dural margin. The PICA origins are visualized and normal. The basilar artery is normal. Both posterior cerebral arteries originate from the basilar tip. There is no significant proximal stenosis or occlusion within either posterior cerebral artery. Venous sinuses: The dural sinuses are patent. The right transverse sinus is dominant. The straight sinus deep cerebral veins are intact. The cortical veins are unremarkable. Anatomic variants: None Delayed phase: Not performed. Arterial source images are unremarkable. Review of the MIP images confirms the above findings IMPRESSION: 1. No emerging large vessel occlusion. 2. Atherosclerotic changes at the aortic arch, carotid bifurcations, dural margin of vertebral arteries, and cavernous internal carotid arteries without significant stenoses. 3. Mild diffuse segmental branch vessel irregularity within both the anterior and posterior circulation suggesting intracranial atherosclerotic change. There is no significant proximal stenosis or occlusion. 4. Multilevel spondylosis of the cervical spine. Electronically Signed   By: San Morelle M.D.   On:  06/30/2016 17:03   Mr  Brain Wo Contrast  Result Date: 07/01/2016 CLINICAL DATA:  Acute presentation with slurred speech and right-sided facial droop. Right upper and lower extremity weakness. EXAM: MRI HEAD WITHOUT CONTRAST TECHNIQUE: Multiplanar, multiecho pulse sequences of the brain and surrounding structures were obtained without intravenous contrast. COMPARISON:  Multiple CT studies done yesterday.  MRI 06/16/2016. FINDINGS: Brain: There is acute infarction affecting the left para median pons. No acute infarction affecting the cerebral hemispheres. No focal cerebellar insult. Few old small vessel infarctions within the thalami I. Moderate chronic small-vessel ischemic changes of the cerebral hemispheric white matter. No cortical or large vessel territory infarction. No mass lesion, hemorrhage, hydrocephalus or extra-axial collection. Vascular: Major vessels at the base of the brain show flow. Skull and upper cervical spine: Negative Sinuses/Orbits: Clear except for mild maxillary sinus mucosal thickening and retention cyst. Orbits negative. Other: None significant IMPRESSION: Acute infarction affecting the left para median pons. No hemorrhage. Chronic small-vessel ischemic changes elsewhere as outlined above. Electronically Signed   By: Nelson Chimes M.D.   On: 07/01/2016 07:11   Mr Brain Wo Contrast  Result Date: 06/16/2016 CLINICAL DATA:  83 y/o  M; generalized weakness and fall. EXAM: MRI HEAD WITHOUT CONTRAST TECHNIQUE: Multiplanar, multiecho pulse sequences of the brain and surrounding structures were obtained without intravenous contrast. COMPARISON:  06/15/2016 CT of the head. FINDINGS: Brain: No acute infarction, hemorrhage, hydrocephalus, extra-axial collection or mass lesion. Foci of T2 FLAIR hyperintense signal abnormality in subcortical and periventricular white matter are nonspecific are compatible with moderate chronic microvascular ischemic changes of the brain. Mild brain parenchymal  volume loss for age. Small chronic lacunar infarct within the left caudate head and hemosiderin stained chronic lacunar infarct in right lentiform nucleus. Vascular: Normal flow voids. Skull and upper cervical spine: Normal marrow signal. Sinuses/Orbits: Mild ethmoid and maxillary sinus mucosal thickening and large right maxillary sinus mucous retention cyst. No abnormal signal of the mastoid air cells. Right intra-ocular lens replacement. Other: None. IMPRESSION: 1. No acute intracranial abnormality. 2. Moderate chronic microvascular ischemic changes and mild parenchymal volume loss of the brain. Small chronic lacunar infarcts within right lentiform nucleus and left caudate head. 3. Mild paranasal sinus disease. Electronically Signed   By: Kristine Garbe M.D.   On: 06/16/2016 19:20   Mr Cervical Spine Wo Contrast  Result Date: 06/17/2016 CLINICAL DATA:  Progressive weakness.  Drooling. EXAM: MRI CERVICAL SPINE WITHOUT CONTRAST TECHNIQUE: Multiplanar, multisequence MR imaging of the cervical spine was performed. No intravenous contrast was administered. COMPARISON:  None. FINDINGS: Alignment: Exaggerated lordosis.  No listhesis. Vertebrae: No fracture, evidence of discitis, or bone lesion. Mild marrow edema about the left C4-5 facet, degenerative appearing. Cord: Normal signal and morphology. Posterior Fossa, vertebral arteries, paraspinal tissues: Bilateral thyroid nodules, up to 12 mm on the left, below size threshold for recommend sonographic follow-up. Disc levels: C2-3: Facet arthropathy with moderate spurring on the left. Ligamentum flavum thickening. Patent canal and foramina C3-4: Facet arthropathy with spurring greater on the left. Ligamentum flavum thickening. Mild endplate ridging. Patent canal and foramina C4-5: Facet arthropathy with moderate bilateral spurring. Ligamentum flavum thickening. No impingement C5-6: Facet arthropathy with mild spurring. Ligamentum flavum thickening. Disc  narrowing with posterior disc osteophyte complex. No impingement C6-7: Mild facet spurring.  Mild disc narrowing.  No impingement C7-T1:Mild facet arthropathy with possible ankylosis on the right. Mild disc narrowing. No impingement IMPRESSION: 1. No explanation for weakness. The canal and foramina are diffusely patent. No evidence of myelopathy. 2. Facet arthropathy and mild  for age disc degeneration. Mild degenerative marrow edema noted about the left C4-5 facet. Electronically Signed   By: Monte Fantasia M.D.   On: 06/17/2016 12:41   Ct Cerebral Perfusion W Contrast  Result Date: 06/30/2016 CLINICAL DATA:  Dysarthria.  Right-sided facial droop. EXAM: CT PERFUSION BRAIN TECHNIQUE: Multiphase CT imaging of the brain was performed following IV bolus contrast injection. Subsequent parametric perfusion maps were calculated using RAPID software. CONTRAST:  40 mL Isovue 370 COMPARISON:  None. FINDINGS: CT Brain Perfusion Findings: CBF (<30%) Volume: 69mL Perfusion (Tmax>6.0s) volume: 80mL Mismatch Volume: 42mL Infarction Location:None No significant patient motion is present. Arterial input and venous output functions are excellent. IMPRESSION: Negative CT perfusion exam of the head. No evidence for acute infarct or ischemia. Electronically Signed   By: San Morelle M.D.   On: 06/30/2016 16:54   Dg Swallowing Func-speech Pathology  Result Date: 06/17/2016 Objective Swallowing Evaluation: Type of Study: MBS-Modified Barium Swallow Study Patient Details Name: QUASHON JESUS MRN: [id number] Date of Birth: 1927-04-27 Today's Date: 06/17/2016 Time: SLP Start Time (ACUTE ONLY): 1230-SLP Stop Time (ACUTE ONLY): 1300 SLP Time Calculation (min) (ACUTE ONLY): 30 min Past Medical History: Past Medical History: Diagnosis Date . Arthritis  . Barrett esophagus  . Bladder cancer ([place]) dx'd 6295,2841  surg only . Colon polyps   adenomatous . Difficulty sleeping   OCCASIONALLY . Diverticulosis of colon (without mention of  hemorrhage)  . Frequency of urination  . GERD (gastroesophageal reflux disease)  . Hiatal hernia  . History of bladder cancer 1981  EXCISION ONLY . History of skin cancer   BASAL CELL . Hyperlipidemia  . Lymphoma, non-Hodgkin's ([place]) 2011  [place]  . Macular degeneration   LEFT EYE . Multiple nodules of lung 09/16/2011 . PAF (paroxysmal atrial fibrillation) ([place])  . Personal history of colonic polyps 07/15/2010  tubular adenoma . Prostate cancer [facility])   patient denies . PUD (peptic ulcer disease)  . Stomach cancer ([place])   NHL origin Past Surgical History: Past Surgical History: Procedure Laterality Date . APPENDECTOMY   . bone spur    left shoulder . CHOLECYSTECTOMY   . KNEE ARTHROPLASTY Left 07/30/2015  Procedure: LEFT TOTAL KNEE ARTHROPLASTY WITH COMPUTER NAVIGATION;  Surgeon: Rod Can, MD;  Location: [facility];  Service: Orthopedics;  Laterality: Left; . LASER OF PROSTATE W/ GREEN LIGHT PVP   . TENDON REPAIR    right arm . TRIGGER FINGER RELEASE    bilateral hands . WRIST SURGERY    Bilateral HPI: Javonte Elenes Wilsonis a 83 y.o.malewith medical history significant for Paroxysmal AFib, GERD, hiatal hernia, Barrett's esophagus, multiple lung nodules, bladder CA, non-Hodgkin's lymphoma, OA, hyperlipidemia who presented to the ED with c/o generalized weakness and fall. Chest x-ray showed possible CHF with superimposed pneumonia in the middle left lung base. Head CT showed no acute abnormalities, small vessel ischemic disease white matter changes,and ethmoidal paranasal sinus disease. Barium esophagram poor esophageal motility. No stricture or mass. Barium tablet passed readily into the stomach. Negative for reflux. No Data Recorded Assessment / Plan / Recommendation CH[facility] IP CLINICAL IMPRESSIONS 06/17/2016 Clinical Impression  Pt demonstrates a moderate oropharyngeal dysphagia with neuromuscular deficits impacting sensory and motor function. Pt observed to have bilateral ptosis, anterior spillage of secretions on  the left (family reports history of left facial droop due to Bell's palsy) and weak cough strength upon arrival to exam. Despite deficits pt apepars conginitively intact and is able to follow complex commands.  Oral function chaacteraized by  mild lingual weakness observed via mild lingual residuals, with functional abiltiy to masticate and transit soft solids (though endurance not taxed). Flash penetration and silent aspriation during the swallow occured with boluses larger than 69ml (estimate) due to slow laryngeal closure and epiglottic deflection. Pt was able trigger a timely swallow and eventually achieve full laryngeal closure which expelled penetrate when possible, but again sluggish movement kept vestible partialy open during bolus passage. Further silent aspiration events also occured after the swallow if pt was cued to cough, which spilled valleuular residual into the airway. Attempted chin tuck without improvement and mendelsohn with visual biofeedback (pt unable after 5 attempts with max cues). Best methods included verbal cues for small sips (bolus size consistently comparable in appearance and fucntion with 61mL teaspoon), oral hold and effortful swallow x2. Given increased residuals with thicker textures, recommend pt initiate a dys 2/thin liquid diet. Recommend water only with oral care at least 3x day with toothbrush and paste. Pt will benefit from exercises to target hyolaryngeal excursion and base of tongue strength as well as EMST. Will f/u for education with family.  SLP Visit Diagnosis Dysphagia, oropharyngeal phase (R13.12) Attention and concentration deficit following -- Frontal lobe and executive function deficit following -- Impact on safety and function --   CHL IP TREATMENT RECOMMENDATION 06/17/2016 Treatment Recommendations Therapy as outlined in treatment plan below   Prognosis 06/17/2016 Prognosis for Safe Diet Advancement Good Barriers to Reach Goals -- Barriers/Prognosis Comment -- [facility]  IP DIET RECOMMENDATION 06/17/2016 SLP Diet Recommendations Dysphagia 2 (Fine chop) solids;Thin liquid Liquid Administration via Cup Medication Administration Whole meds with puree Compensations Slow rate;Small sips/bites;Effortful swallow;Minimize environmental distractions;Clear throat intermittently Postural Changes Remain semi-upright after after feeds/meals (Comment)   CHL IP OTHER RECOMMENDATIONS 06/17/2016 Recommended Consults -- Oral Care Recommendations Oral care before and after PO Other Recommendations --   [facility] IP FOLLOW UP RECOMMENDATIONS 06/17/2016 Follow up Recommendations 24 hour supervision/assistance   CHL IP FREQUENCY AND DURATION 06/17/2016 Speech Therapy Frequency (ACUTE ONLY) min 3x week Treatment Duration 2 weeks      [facility] IP ORAL PHASE 06/17/2016 Oral Phase Impaired Oral - Pudding Teaspoon -- Oral - Pudding Cup -- Oral - Honey Teaspoon -- Oral - Honey Cup -- Oral - Nectar Teaspoon -- Oral - Nectar Cup -- Oral - Nectar Straw -- Oral - Thin Teaspoon -- Oral - Thin Cup Left anterior bolus loss;Lingual/palatal residue Oral - Thin Straw -- Oral - Puree WFL Oral - Mech Soft WFL Oral - Regular -- Oral - Multi-Consistency -- Oral - Pill -- Oral Phase - Comment --  CHL IP PHARYNGEAL PHASE 06/17/2016 Pharyngeal Phase Impaired Pharyngeal- Pudding Teaspoon -- Pharyngeal -- Pharyngeal- Pudding Cup -- Pharyngeal -- Pharyngeal- Honey Teaspoon -- Pharyngeal -- Pharyngeal- Honey Cup -- Pharyngeal -- Pharyngeal- Nectar Teaspoon -- Pharyngeal -- Pharyngeal- Nectar Cup -- Pharyngeal -- Pharyngeal- Nectar Straw -- Pharyngeal -- Pharyngeal- Thin Teaspoon -- Pharyngeal -- Pharyngeal- Thin Cup Reduced anterior laryngeal mobility;Penetration/Aspiration during swallow;Reduced tongue base retraction;Penetration/Apiration after swallow;Moderate aspiration;Trace aspiration;Pharyngeal residue - valleculae;Compensatory strategies attempted (with notebox) Pharyngeal Material enters airway, passes BELOW cords without attempt by  patient to eject out (silent aspiration);Material enters airway, CONTACTS cords and then ejected out;Material does not enter airway Pharyngeal- Thin Straw -- Pharyngeal -- Pharyngeal- Puree Reduced anterior laryngeal mobility;Reduced tongue base retraction;Pharyngeal residue - valleculae;Compensatory strategies attempted (with notebox) Pharyngeal -- Pharyngeal- Mechanical Soft Reduced anterior laryngeal mobility;Reduced tongue base retraction;Pharyngeal residue - valleculae;Compensatory strategies attempted (with notebox) Pharyngeal -- Pharyngeal- Regular -- Pharyngeal --  Pharyngeal- Multi-consistency -- Pharyngeal -- Pharyngeal- Pill -- Pharyngeal -- Pharyngeal Comment --  No flowsheet data found. CHL IP GO 06/16/2016 Functional Assessment Tool Used skilled clinical judgement Functional Limitations Swallowing Swallow Current Status [phone]) CK Swallow Goal Status (O7564) [place] Swallow Discharge Status [phone]) (None) Motor Speech Current Status [phone] (None) Motor Speech Goal Status (Y6063) (None) Motor Speech Goal Status (K1601) (None) Spoken Language Comprehension Current Status (U9323) (None) Spoken Language Comprehension Goal Status (F5732) (None) Spoken Language Comprehension Discharge Status [phone]) (None) Spoken Language Expression Current Status [phone] (None) Spoken Language Expression Goal Status [phone]) (None) Spoken Language Expression Discharge Status [phone]) (None) Attention Current Status (O1607) (None) Attention Goal Status (P7106) (None) Attention Discharge Status [phone]) (None) Memory Current Status (N4627) (None) Memory Goal Status (O3500) (None) Memory Discharge Status (X3818) (None) Voice Current Status (E9937) (None) Voice Goal Status (J6967) (None) Voice Discharge Status (E9381) (None) Other Speech-Language Pathology Functional Limitation Current Status (O1751) (None) Other Speech-Language Pathology Functional Limitation Goal Status (W2585) (None) Other Speech-Language Pathology Functional  Limitation Discharge Status [phone]) (None) DeBlois, Katherene Ponto 06/17/2016, 2:21 PM              Ct Head Code Stroke W/o Cm  Addendum Date: 06/30/2016   ADDENDUM REPORT: 06/30/2016 16:06 ADDENDUM: Study discussed by telephone on 06/30/2016 with Dr. Harrell Gave Tegeler, at 1534 hours. Electronically Signed   By: Genevie Ann M.D.   On: 06/30/2016 16:06   Result Date: 06/30/2016 CLINICAL DATA:  Code stroke. 83 year old male with episode of right leg weakness slurred speech and right facial droop which lasted about 1 hour today. Facial droop persists but other symptoms have resolved. Initial encounter. EXAM: CT HEAD WITHOUT CONTRAST TECHNIQUE: Contiguous axial images were obtained from the base of the skull through the vertex without intravenous contrast. COMPARISON:  Brain MRI 06/16/2016 and earlier. FINDINGS: Brain: Stable cerebral volume. No midline shift, mass effect, or evidence of intracranial mass lesion. No ventriculomegaly. No acute intracranial hemorrhage identified. Stable gray-white matter differentiation throughout the brain. Chronic white matter hypodensity appears stable. No cortically based acute infarct identified. Vascular: Calcified atherosclerosis at the skull base. No suspicious intracranial vascular hyperdensity. Skull: No acute osseous abnormality identified. Sinuses/Orbits: Visualized paranasal sinuses and mastoids are stable and well pneumatized. Other: No acute orbit or scalp soft tissue findings. ASPECTS ([place] Stroke Program Early CT Score) - Ganglionic level infarction (caudate, lentiform nuclei, internal capsule, insula, M1-M3 cortex): 7 - Supraganglionic infarction (M4-M6 cortex): 3 Total score (0-10 with 10 being normal): 10 IMPRESSION: 1. Stable non contrast CT appearance of the brain, no acute intracranial abnormality. 2. ASPECTS is 10. 3. Chronic white matter changes. Electronically Signed: By: Genevie Ann M.D. On: 06/30/2016 15:24     CBC  Recent Labs Lab 06/30/16 1524  06/30/16 1530  WBC 8.2  --   HGB 12.1* 12.9*  HCT 38.3* 38.0*  PLT 194  --   MCV 85.5  --   MCH 27.0  --   MCHC 31.6  --   RDW 13.5  --   LYMPHSABS 1.7  --   MONOABS 0.5  --   EOSABS 0.1  --   BASOSABS 0.0  --     Chemistries   Recent Labs Lab 06/30/16 1524 06/30/16 1530  NA 140 142  K 3.9 3.8  CL 106 105  CO2 25  --   GLUCOSE 103* 98  BUN 28* 30*  CREATININE 1.21 1.10  CALCIUM 9.1  --   AST 23  --   ALT 12*  --  ALKPHOS 30*  --   BILITOT 0.8  --    ------------------------------------------------------------------------------------------------------------------ estimated creatinine clearance is 57.3 mL/min (by C-G formula based on SCr of 1.1 mg/dL). ------------------------------------------------------------------------------------------------------------------ No results for input(s): HGBA1C in the last 72 hours. ------------------------------------------------------------------------------------------------------------------  Recent Labs  07/01/16 0746  CHOL 123  HDL 45  LDLCALC 68  TRIG 52  CHOLHDL 2.7   ------------------------------------------------------------------------------------------------------------------ No results for input(s): TSH, T4TOTAL, T3FREE, THYROIDAB in the last 72 hours.  Invalid input(s): FREET3 ------------------------------------------------------------------------------------------------------------------  Recent Labs  07/01/16 0746  VITAMINB12 1,101*    Coagulation profile  Recent Labs Lab 06/30/16 1524  INR 1.58    No results for input(s): DDIMER in the last 72 hours.  Cardiac Enzymes No results for input(s): CKMB, TROPONINI, MYOGLOBIN in the last 168 hours.  Invalid input(s): CK ------------------------------------------------------------------------------------------------------------------ Invalid input(s): POCBNP   CBG:  Recent Labs Lab 06/30/16 2146  GLUCAP 90       Studies: Ct Angio  Head W Or Wo Contrast  Result Date: 06/30/2016 CLINICAL DATA:  New onset right-sided facial droop and intermittent dysarthria over the last 2 days. EXAM: CT ANGIOGRAPHY HEAD AND NECK TECHNIQUE: Multidetector CT imaging of the head and neck was performed using the standard protocol during bolus administration of intravenous contrast. Multiplanar CT image reconstructions and MIPs were obtained to evaluate the vascular anatomy. Carotid stenosis measurements (when applicable) are obtained utilizing NASCET criteria, using the distal internal carotid diameter as the denominator. CONTRAST:  50 mL Isovue 370 COMPARISON:  CT head without contrast from the same day. MRI brain 06/16/2016 FINDINGS: CTA NECK FINDINGS Aortic arch: A 3 vessel arch configuration is present. Atherosclerotic changes are present at the aortic arch without significant stenosis at the great vessel origins. There is no aneurysm. Right carotid system: Common carotid artery is tortuous proximally. There is no focal stenosis. Atherosclerotic calcifications are present at the right carotid bifurcation without significant stenosis relative to the more distal vessel. Remainder of the right internal carotid artery is normal. Left carotid system: The left common carotid artery is tortuous proximally. There is no significant stenosis. Atherosclerotic changes are present at the carotid bifurcation without significant stenosis. The more distal left internal carotid artery is normal. Vertebral arteries: Both vertebral arteries originate from the subclavian arteries without significant stenoses. The vertebral arteries are codominant. There is no significant stenosis or vascular injury to either vertebral artery in the neck. Skeleton: Mild endplate degenerative and facet changes are present. No focal lytic or blastic lesions are present. There is exaggerated thoracic kyphosis and cervical lordosis. Other neck: No focal mucosal or submucosal lesions are present.  Bilateral thyroid nodules are present without a dominant lesion. There is no significant adenopathy. The salivary glands are within normal limits. Upper chest: The lung apices demonstrate bilateral dependent atelectasis. No focal nodule or mass lesion is present. Review of the MIP images confirms the above findings CTA HEAD FINDINGS Anterior circulation: Atherosclerotic calcifications are present within the cavernous internal carotid arteries bilaterally without a significant stenosis through the ICA terminus. The A1 and M1 segments are normal. The anterior communicating artery is patent. The MCA bifurcations are intact. There is segmental attenuation of ACA and MCA branch vessels bilaterally without a significant proximal stenosis or occlusion. Posterior circulation: The vertebral arteries are codominant. Atherosclerotic changes are present at the dural margin. The PICA origins are visualized and normal. The basilar artery is normal. Both posterior cerebral arteries originate from the basilar tip. There is no significant proximal stenosis or occlusion within either posterior  cerebral artery. Venous sinuses: The dural sinuses are patent. The right transverse sinus is dominant. The straight sinus deep cerebral veins are intact. The cortical veins are unremarkable. Anatomic variants: None Delayed phase: Not performed. Arterial source images are unremarkable. Review of the MIP images confirms the above findings IMPRESSION: 1. No emerging large vessel occlusion. 2. Atherosclerotic changes at the aortic arch, carotid bifurcations, dural margin of vertebral arteries, and cavernous internal carotid arteries without significant stenoses. 3. Mild diffuse segmental branch vessel irregularity within both the anterior and posterior circulation suggesting intracranial atherosclerotic change. There is no significant proximal stenosis or occlusion. 4. Multilevel spondylosis of the cervical spine. Electronically Signed   By:  San Morelle M.D.   On: 06/30/2016 17:03   Dg Chest 2 View  Result Date: 06/30/2016 CLINICAL DATA:  Cough, recent episode of pneumonia, stroke symptoms. History of bladder malignancy EXAM: CHEST  2 VIEW COMPARISON:  Chest x-ray of June 15, 2016 FINDINGS: The lungs are adequately inflated. The interstitial markings are coarse in the mid and lower lung zones but have improved in the upper lobes. The cardiac silhouette remains enlarged. The pulmonary vascularity is not engorged. There is no pleural effusion. There is calcification in the wall of the thoracic aorta. There is multilevel degenerative disc disease of the thoracic spine. IMPRESSION: There has been some improvement in the appearance of the lungs especially upper lobes but coarse interstitial markings persist in the lower lobes. There is no discrete pneumonia. There is stable cardiomegaly without pulmonary vascular congestion. Thoracic aortic atherosclerosis. Electronically Signed   By: David  Martinique M.D.   On: 06/30/2016 15:47   Ct Angio Neck W Or Wo Contrast  Result Date: 06/30/2016 CLINICAL DATA:  New onset right-sided facial droop and intermittent dysarthria over the last 2 days. EXAM: CT ANGIOGRAPHY HEAD AND NECK TECHNIQUE: Multidetector CT imaging of the head and neck was performed using the standard protocol during bolus administration of intravenous contrast. Multiplanar CT image reconstructions and MIPs were obtained to evaluate the vascular anatomy. Carotid stenosis measurements (when applicable) are obtained utilizing NASCET criteria, using the distal internal carotid diameter as the denominator. CONTRAST:  50 mL Isovue 370 COMPARISON:  CT head without contrast from the same day. MRI brain 06/16/2016 FINDINGS: CTA NECK FINDINGS Aortic arch: A 3 vessel arch configuration is present. Atherosclerotic changes are present at the aortic arch without significant stenosis at the great vessel origins. There is no aneurysm. Right carotid  system: Common carotid artery is tortuous proximally. There is no focal stenosis. Atherosclerotic calcifications are present at the right carotid bifurcation without significant stenosis relative to the more distal vessel. Remainder of the right internal carotid artery is normal. Left carotid system: The left common carotid artery is tortuous proximally. There is no significant stenosis. Atherosclerotic changes are present at the carotid bifurcation without significant stenosis. The more distal left internal carotid artery is normal. Vertebral arteries: Both vertebral arteries originate from the subclavian arteries without significant stenoses. The vertebral arteries are codominant. There is no significant stenosis or vascular injury to either vertebral artery in the neck. Skeleton: Mild endplate degenerative and facet changes are present. No focal lytic or blastic lesions are present. There is exaggerated thoracic kyphosis and cervical lordosis. Other neck: No focal mucosal or submucosal lesions are present. Bilateral thyroid nodules are present without a dominant lesion. There is no significant adenopathy. The salivary glands are within normal limits. Upper chest: The lung apices demonstrate bilateral dependent atelectasis. No focal nodule or mass lesion  is present. Review of the MIP images confirms the above findings CTA HEAD FINDINGS Anterior circulation: Atherosclerotic calcifications are present within the cavernous internal carotid arteries bilaterally without a significant stenosis through the ICA terminus. The A1 and M1 segments are normal. The anterior communicating artery is patent. The MCA bifurcations are intact. There is segmental attenuation of ACA and MCA branch vessels bilaterally without a significant proximal stenosis or occlusion. Posterior circulation: The vertebral arteries are codominant. Atherosclerotic changes are present at the dural margin. The PICA origins are visualized and normal. The  basilar artery is normal. Both posterior cerebral arteries originate from the basilar tip. There is no significant proximal stenosis or occlusion within either posterior cerebral artery. Venous sinuses: The dural sinuses are patent. The right transverse sinus is dominant. The straight sinus deep cerebral veins are intact. The cortical veins are unremarkable. Anatomic variants: None Delayed phase: Not performed. Arterial source images are unremarkable. Review of the MIP images confirms the above findings IMPRESSION: 1. No emerging large vessel occlusion. 2. Atherosclerotic changes at the aortic arch, carotid bifurcations, dural margin of vertebral arteries, and cavernous internal carotid arteries without significant stenoses. 3. Mild diffuse segmental branch vessel irregularity within both the anterior and posterior circulation suggesting intracranial atherosclerotic change. There is no significant proximal stenosis or occlusion. 4. Multilevel spondylosis of the cervical spine. Electronically Signed   By: San Morelle M.D.   On: 06/30/2016 17:03   Mr Brain Wo Contrast  Result Date: 07/01/2016 CLINICAL DATA:  Acute presentation with slurred speech and right-sided facial droop. Right upper and lower extremity weakness. EXAM: MRI HEAD WITHOUT CONTRAST TECHNIQUE: Multiplanar, multiecho pulse sequences of the brain and surrounding structures were obtained without intravenous contrast. COMPARISON:  Multiple CT studies done yesterday.  MRI 06/16/2016. FINDINGS: Brain: There is acute infarction affecting the left para median pons. No acute infarction affecting the cerebral hemispheres. No focal cerebellar insult. Few old small vessel infarctions within the thalami I. Moderate chronic small-vessel ischemic changes of the cerebral hemispheric white matter. No cortical or large vessel territory infarction. No mass lesion, hemorrhage, hydrocephalus or extra-axial collection. Vascular: Major vessels at the base of the  brain show flow. Skull and upper cervical spine: Negative Sinuses/Orbits: Clear except for mild maxillary sinus mucosal thickening and retention cyst. Orbits negative. Other: None significant IMPRESSION: Acute infarction affecting the left para median pons. No hemorrhage. Chronic small-vessel ischemic changes elsewhere as outlined above. Electronically Signed   By: Nelson Chimes M.D.   On: 07/01/2016 07:11   Ct Cerebral Perfusion W Contrast  Result Date: 06/30/2016 CLINICAL DATA:  Dysarthria.  Right-sided facial droop. EXAM: CT PERFUSION BRAIN TECHNIQUE: Multiphase CT imaging of the brain was performed following IV bolus contrast injection. Subsequent parametric perfusion maps were calculated using RAPID software. CONTRAST:  40 mL Isovue 370 COMPARISON:  None. FINDINGS: CT Brain Perfusion Findings: CBF (<30%) Volume: 70mL Perfusion (Tmax>6.0s) volume: 16mL Mismatch Volume: 53mL Infarction Location:None No significant patient motion is present. Arterial input and venous output functions are excellent. IMPRESSION: Negative CT perfusion exam of the head. No evidence for acute infarct or ischemia. Electronically Signed   By: San Morelle M.D.   On: 06/30/2016 16:54   Ct Head Code Stroke W/o Cm  Addendum Date: 06/30/2016   ADDENDUM REPORT: 06/30/2016 16:06 ADDENDUM: Study discussed by telephone on 06/30/2016 with Dr. Marda Stalker, at 1534 hours. Electronically Signed   By: Genevie Ann M.D.   On: 06/30/2016 16:06   Result Date: 06/30/2016 CLINICAL DATA:  Code  stroke. 83 year old male with episode of right leg weakness slurred speech and right facial droop which lasted about 1 hour today. Facial droop persists but other symptoms have resolved. Initial encounter. EXAM: CT HEAD WITHOUT CONTRAST TECHNIQUE: Contiguous axial images were obtained from the base of the skull through the vertex without intravenous contrast. COMPARISON:  Brain MRI 06/16/2016 and earlier. FINDINGS: Brain: Stable cerebral volume. No  midline shift, mass effect, or evidence of intracranial mass lesion. No ventriculomegaly. No acute intracranial hemorrhage identified. Stable gray-white matter differentiation throughout the brain. Chronic white matter hypodensity appears stable. No cortically based acute infarct identified. Vascular: Calcified atherosclerosis at the skull base. No suspicious intracranial vascular hyperdensity. Skull: No acute osseous abnormality identified. Sinuses/Orbits: Visualized paranasal sinuses and mastoids are stable and well pneumatized. Other: No acute orbit or scalp soft tissue findings. ASPECTS ([place] Stroke Program Early CT Score) - Ganglionic level infarction (caudate, lentiform nuclei, internal capsule, insula, M1-M3 cortex): 7 - Supraganglionic infarction (M4-M6 cortex): 3 Total score (0-10 with 10 being normal): 10 IMPRESSION: 1. Stable non contrast CT appearance of the brain, no acute intracranial abnormality. 2. ASPECTS is 10. 3. Chronic white matter changes. Electronically Signed: By: Genevie Ann M.D. On: 06/30/2016 15:24      No results found for: HGBA1C Lab Results  Component Value Date   LDLCALC 68 07/01/2016   CREATININE 1.10 06/30/2016       Scheduled Meds: . apixaban  5 mg Oral BID  . guaiFENesin  600 mg Oral BID  . pantoprazole  40 mg Oral BID  . simvastatin  40 mg Oral QHS  . tamsulosin  0.4 mg Oral QHS   Continuous Infusions: . sodium chloride Stopped (07/01/16 0607)     LOS: 0 days    Time spent: >30 MINS    [facility]  [facility] Pager [phone]. If 7PM-7AM, please contact night-coverage at [email or web address], password [facility] 07/01/2016, 10:20 AM  LOS: 0 days

## 2016-07-02 ENCOUNTER — Inpatient Hospital Stay (HOSPITAL_COMMUNITY): Payer: Medicare Other

## 2016-07-02 LAB — GLUCOSE, CAPILLARY: GLUCOSE-CAPILLARY: 107 mg/dL — AB (ref 65–99)

## 2016-07-02 LAB — HEMOGLOBIN A1C
HEMOGLOBIN A1C: 5.5 % (ref 4.8–5.6)
Mean Plasma Glucose: 111 mg/dL

## 2016-07-02 LAB — CBC
HCT: 37.4 % — ABNORMAL LOW (ref 39.0–52.0)
Hemoglobin: 11.9 g/dL — ABNORMAL LOW (ref 13.0–17.0)
MCH: 26.9 pg (ref 26.0–34.0)
MCHC: 31.8 g/dL (ref 30.0–36.0)
MCV: 84.6 fL (ref 78.0–100.0)
PLATELETS: 160 10*3/uL (ref 150–400)
RBC: 4.42 MIL/uL (ref 4.22–5.81)
RDW: 13.4 % (ref 11.5–15.5)
WBC: 6.2 10*3/uL (ref 4.0–10.5)

## 2016-07-02 LAB — URINE CULTURE

## 2016-07-02 MED ORDER — JEVITY 1.2 CAL PO LIQD
1000.0000 mL | ORAL | Status: DC
  Administered 2016-07-02 – 2016-07-04 (×2): 1000 mL
  Filled 2016-07-02 (×10): qty 1000

## 2016-07-02 MED ORDER — FREE WATER
200.0000 mL | Freq: Four times a day (QID) | Status: DC
  Administered 2016-07-02 – 2016-07-04 (×6): 200 mL

## 2016-07-02 MED ORDER — JEVITY 1.2 CAL PO LIQD: 1000.0000 mL | ORAL | Status: DC

## 2016-07-02 NOTE — Progress Notes (Signed)
Initial Nutrition Assessment  DOCUMENTATION CODES:  Obesity unspecified  INTERVENTION:  Initiate Jevity 1.2 @ 65 ml/hr via NGT   Tube feeding regimen provides 1872 kcal (100% of needs), 87 grams of protein, and 1259 ml of H2O.   To fully meet fluid needs of >2 L, pt would require either 200 cc free water q 6 hrs or IVF @ 35 ml/hr  NUTRITION DIAGNOSIS:  Swallowing difficulty related to CVA as evidenced by  SLP eval and need for NPO status.  GOAL:  Patient will meet greater than or equal to 90% of their needs  MONITOR:  Diet advancement, Labs, Weight trends, TF tolerance  REASON FOR ASSESSMENT:  Consult  Enteral/tube feeding initiation and management  Assessment of nutritional status  ASSESSMENT:  83 y/o male PMHx A fib, Barrets Esophagus, Bladder Cancer, Skin cancer,  Non-Hodgkins Lymphoma, OA, HLD, PUD, recent hospitalization for CAP 3/14-3/19. Was silently aspirating. Discharged to SNF afterwards. Presented after experiencing acute onset slurred speech, R sided Facial Droop and Right side weakness. MRI showed left pontine infarct.     MBSS done yesterday-showing silent penetration and aspiration. NPO status was recommended.   At SNF, patient was on D3 diet. Was only allowed to drink clear liquids. Per daughter at bedside, Despite restrictions, pt was eating very well up until he suffered on Thursday Morning. It has been ~60 hrs since he has had nutrition.   He was taking Icaps. No other oral supplements, was reportedly only allowed to have water for liquids.   UBW as reported as ~250 lbs. Per chart review, Pt has lost 12-14 lbs from when he was admitted two weeks ago with CAP-> significant loss of >5%.   RD consulted for Small bore Tube placement with initiation of TF.   Tube placed. Very difficult to navigate stomach. Able to achieve mid gastric access. Daughter states patient has "stomach full of hernias"  Physical Exam: WDL.   Labs: Lipid panel WDL, A1C  5.5 Medications: PPI, NS   Recent Labs Lab 06/30/16 1524 06/30/16 1530  NA 140 142  K 3.9 3.8  CL 106 105  CO2 25  --   BUN 28* 30*  CREATININE 1.21 1.10  CALCIUM 9.1  --   GLUCOSE 103* 98   Diet Order:  Diet NPO time specified  Skin: PU stage 1 to sacrum  Last BM:  Unknown  Height:  Ht Readings from Last 1 Encounters:  06/30/16 5\' 10"  (1.778 m)   Weight:  Wt Readings from Last 1 Encounters:  06/30/16 221 lb 4.8 oz (100.4 kg)   Wt Readings from Last 10 Encounters:  06/30/16 221 lb 4.8 oz (100.4 kg)  06/20/16 233 lb 4 oz (105.8 kg)  12/24/15 226 lb 9.6 oz (102.8 kg)  11/15/15 230 lb (104.3 kg)  07/30/15 233 lb (105.7 kg)  07/21/15 233 lb (105.7 kg)  12/27/14 235 lb 2 oz (106.7 kg)  08/16/13 226 lb (102.5 kg)  01/14/13 232 lb 3.2 oz (105.3 kg)  07/16/12 250 lb 4.8 oz (113.5 kg)   Ideal Body Weight:  75.45 kg  BMI:  Body mass index is 31.75 kg/m.  Estimated Nutritional Needs:  Kcal:  1800-2000 kcals (18-20 kcal-kg bw) Protein:  83-98 g Pro (1.1-1.3 g/kg ibw) Fluid:  >2 L fluid  EDUCATION NEEDS:  No education needs identified at this time  Burtis Junes RD, LDN, [facility] Pager: [phone] 07/02/2016 4:24 PM

## 2016-07-02 NOTE — Progress Notes (Signed)
SLP Cancellation Note  Patient Details Name: ABDULKAREEM BADOLATO MRN: [id number] DOB: [birth year]   Cancelled treatment:        Pt not appropriate for swallow re-evaluation at this time. MBS performed yesterday showing silent penetration and aspiration during initial and multiple swallows with nectar and honey-thick consistencies, weak cough ineffective for airway clearance, significant residue remained in pharynx increasing aspiration risk. Recommend NPO, oral care, short term alternative nutrition, ST intervention to target observed pharyngeal impairments.   Deneise Lever, [place] [facility] Speech-Language Pathologist [phone]   Aliene Altes 07/02/2016, 12:38 PM

## 2016-07-02 NOTE — Progress Notes (Signed)
[facility] PROGRESS NOTE  Jason [name] KGY:[id number] DOB: 12-May-1927 DOA: 06/30/2016   PCP: Melinda Crutch, MD     Assessment/Plan: Principal Problem:   Hemiplegia affecting dominant side [name] Active Problems:   Hyperlipidemia   GERD (gastroesophageal reflux disease)   BPH (benign prostatic hyperplasia)   PAF (paroxysmal atrial fibrillation) (HCC)   Dysphagia   CVA (cerebral vascular accident) ([place])   Dysarthria   History of cancer   83 y.o. right handed male with medical history significant of PAF on Eliquis, Barrett's esophagus, bladder cancer, non-Hodgkin's lymphoma, OA, and HLD;  who presents with acute onset of slurred speech, right-sided facial droop, and right sided upper and lower extremity weakness. Patient's daughter helps provide history and states that when she went to visit the patient at the rehabilitation facility she noticed that his speech was significantly slurred to the point where she was unable to understand him.He had just been recently hospitalized from 3/14 -3/19 for community-acquired pneumonia. During hospitalization he had an event of unresponsiveness with bradycardia, but cardiology felt it was noncardiac in nature. Patient had negative MRI, MRA, and EEG studies. Due to swallowing difficulties patient was evaluated by speech therapy and seen to be silently aspirating.Upon discharge he was sent to a skilled nursing facility for rehabilitation and completed a full course of Levaquin on 3/21.Neurology was called and patient was brought for a emergent CT perfusion to evaluate for M1 stenosis or occlusion.   Assessment/Plan  Stroke:   L paramedian pontine infarct secondary to small vessel disease source. However, pt has pAfib on eliquis (compliance).   Code Stroke CT no acute stroke. small vessel disease. Aspects 10.   CTA head/neck no ELVO. Atherosclerosis. Multilevel spondylosis  CT Perfusion negative. No acute infarct  MRI  acute L paramedian  pontine infarct. small vessel disease.   LE venous doppler 06/15/16 NO DVT. R&L enlarged lymph nodes  2D Echo  06/15/16 severe LVH. EF 55-60%. No valve abnormalities  Pan CT negative for malignancy,No recurrence of lymphadenopathy identified within chest, abdomen or pelvis  LDL 68 ,Statin on hold due to NPO status-place  cor trak tube   HgbA1c 5.5  UDS negative  lovenox therapeutic dose   Npo as per speech therapy, has there been any improvement since admission?  Eliquis (apixaban) daily prior to admission, now on lovenox therapeutic dose due to NPO status. Once passed swallow or have po access, can switch back to eliquis.  Patient counseled to be compliant with his antithrombotic medications  Ongoing aggressive stroke risk factor management  Therapy recommendations: SN[name] Pt has follow up appointment with Dr. [name] at [facility] on 07/07/16   Previous diet recommendations from hospitalization on 3/14:  Previously on Dysphagia 3 (mechanical soft);Thin liquid Currently nothing by mouth    Recent community-acquired pneumonia: Chest x-ray overall improved. - Continue Mucinex, completed Levaquin  Paroxysmal atrial fibrillation: Chadsvasc score = 5 - Continue Eliquis  Hyperlipidemia - Continue simvastatin  BPH - Continue Flomax    Abnormal UA - Check urine culture   Normocytic normochromic anemia: Stable - Continue to monitor   GERD with H/O Barrett's esophagus - Pharmacy substitution of Protonix for Nexium    DVT prophylaxsis Lovenox >eliquis  Code Status:   Full code   Family Communication: Discussed in detail with the patient, all imaging results, lab results explained to the patient   Disposition Plan:  SNF likely Monday      Consultants: Neurology  Procedures: None  Antibiotics: Anti-infectives    None         HPI/Subjective:  Patient seen in the presence of his daughter by the bedside, continues to have facial droop and  slurred speech, sleeping a lot   Objective: Vitals:   07/01/16 1828 07/01/16 2055 07/02/16 0136 07/02/16 0536  BP: (!) 144/77 (!) 148/53 (!) 158/62 (!) 147/63  Pulse: 82 74 72 67  Resp: 17 18 18 20   Temp: 98.5 F (36.9 C) 98.7 F (37.1 C) 98.7 F (37.1 C) 98.9 F (37.2 C)  TempSrc: Oral Oral Oral Oral  SpO2: 99% 100% 97% 98%  Weight:      Height:        Intake/Output Summary (Last 24 hours) at 07/02/16 1019 Last data filed at 07/02/16 0136  Gross per 24 hour  Intake                0 ml  Output              250 ml  Net             -250 ml    Exam:  Examination:  General exam: Appears calm and comfortable  Respiratory system: Clear to auscultation. Respiratory effort normal. Cardiovascular system: S1 & S2 heard, RRR. No JVD, murmurs, rubs, gallops or clicks. No pedal edema. Gastrointestinal system: Abdomen is nondistended, soft and nontender. No organomegaly or masses felt. Normal bowel sounds heard. Central nervous system:   dysarthria,right nasolabial fold flattening Extremities: Symmetric 5 x 5 power. Skin: No rashes, lesions or ulcers Psychiatry: Judgement and insight appear normal. Mood & affect appropriate.     Data Reviewed: I have personally reviewed following labs and imaging studies  Micro Results No results found for this or any previous visit (from the past 240 hour(s)).  Radiology Reports Ct Angio Head W Or Wo Contrast  Result Date: 06/30/2016 CLINICAL DATA:  New onset right-sided facial droop and intermittent dysarthria over the last 2 days. EXAM: CT ANGIOGRAPHY HEAD AND NECK TECHNIQUE: Multidetector CT imaging of the head and neck was performed using the standard protocol during bolus administration of intravenous contrast. Multiplanar CT image reconstructions and MIPs were obtained to evaluate the vascular anatomy. Carotid stenosis measurements (when applicable) are obtained utilizing NASCET criteria, using the distal internal carotid diameter as the  denominator. CONTRAST:  50 mL Isovue 370 COMPARISON:  CT head without contrast from the same day. MRI brain 06/16/2016 FINDINGS: CTA NECK FINDINGS Aortic arch: A 3 vessel arch configuration is present. Atherosclerotic changes are present at the aortic arch without significant stenosis at the great vessel origins. There is no aneurysm. Right carotid system: Common carotid artery is tortuous proximally. There is no focal stenosis. Atherosclerotic calcifications are present at the right carotid bifurcation without significant stenosis relative to the more distal vessel. Remainder of the right internal carotid artery is normal. Left carotid system: The left common carotid artery is tortuous proximally. There is no significant stenosis. Atherosclerotic changes are present at the carotid bifurcation without significant stenosis. The more distal left internal carotid artery is normal. Vertebral arteries: Both vertebral arteries originate from the subclavian arteries without significant stenoses. The vertebral arteries are codominant. There is no significant stenosis or vascular injury to either vertebral artery in the neck. Skeleton: Mild endplate degenerative and facet changes are present. No focal lytic or blastic lesions are present. There is exaggerated thoracic kyphosis and cervical lordosis. Other neck: No focal mucosal or submucosal lesions are present. Bilateral thyroid nodules  are present without a dominant lesion. There is no significant adenopathy. The salivary glands are within normal limits. Upper chest: The lung apices demonstrate bilateral dependent atelectasis. No focal nodule or mass lesion is present. Review of the MIP images confirms the above findings CTA HEAD FINDINGS Anterior circulation: Atherosclerotic calcifications are present within the cavernous internal carotid arteries bilaterally without a significant stenosis through the ICA terminus. The A1 and M1 segments are normal. The anterior  communicating artery is patent. The MCA bifurcations are intact. There is segmental attenuation of ACA and MCA branch vessels bilaterally without a significant proximal stenosis or occlusion. Posterior circulation: The vertebral arteries are codominant. Atherosclerotic changes are present at the dural margin. The PICA origins are visualized and normal. The basilar artery is normal. Both posterior cerebral arteries originate from the basilar tip. There is no significant proximal stenosis or occlusion within either posterior cerebral artery. Venous sinuses: The dural sinuses are patent. The right transverse sinus is dominant. The straight sinus deep cerebral veins are intact. The cortical veins are unremarkable. Anatomic variants: None Delayed phase: Not performed. Arterial source images are unremarkable. Review of the MIP images confirms the above findings IMPRESSION: 1. No emerging large vessel occlusion. 2. Atherosclerotic changes at the aortic arch, carotid bifurcations, dural margin of vertebral arteries, and cavernous internal carotid arteries without significant stenoses. 3. Mild diffuse segmental branch vessel irregularity within both the anterior and posterior circulation suggesting intracranial atherosclerotic change. There is no significant proximal stenosis or occlusion. 4. Multilevel spondylosis of the cervical spine. Electronically Signed   By: San Morelle M.D.   On: 06/30/2016 17:03   Dg Chest 2 View  Result Date: 06/30/2016 CLINICAL DATA:  Cough, recent episode of pneumonia, stroke symptoms. History of bladder malignancy EXAM: CHEST  2 VIEW COMPARISON:  Chest x-ray of June 15, 2016 FINDINGS: The lungs are adequately inflated. The interstitial markings are coarse in the mid and lower lung zones but have improved in the upper lobes. The cardiac silhouette remains enlarged. The pulmonary vascularity is not engorged. There is no pleural effusion. There is calcification in the wall of the  thoracic aorta. There is multilevel degenerative disc disease of the thoracic spine. IMPRESSION: There has been some improvement in the appearance of the lungs especially upper lobes but coarse interstitial markings persist in the lower lobes. There is no discrete pneumonia. There is stable cardiomegaly without pulmonary vascular congestion. Thoracic aortic atherosclerosis. Electronically Signed   By: David  Martinique M.D.   On: 06/30/2016 15:47   Dg Chest 2 View  Result Date: 06/15/2016 CLINICAL DATA:  Dizziness and confusion EXAM: CHEST  2 VIEW COMPARISON:  Aug 09, 2013 FINDINGS: There is cardiomegaly with mild pulmonary venous hypertension. There is mild interstitial edema in the lung bases. There is focal airspace consolidation in the medial left base. Lungs elsewhere clear. No adenopathy. No bone lesions. IMPRESSION: Findings consistent with a degree of congestive heart failure. Suspect superimposed pneumonia medial left base. Followup PA and lateral chest radiographs recommended in 3-4 weeks following trial of antibiotic therapy to ensure resolution and exclude underlying malignancy. Electronically Signed   By: Lowella Grip III M.D.   On: 06/15/2016 07:58   Ct Head Wo Contrast  Result Date: 06/15/2016 CLINICAL DATA:  Lower extremity weakness.  Fall. EXAM: CT HEAD WITHOUT CONTRAST TECHNIQUE: Contiguous axial images were obtained from the base of the skull through the vertex without intravenous contrast. COMPARISON:  Head CT November 15, 2015 FINDINGS: Brain: Moderate diffuse atrophy is stable.  There is no intracranial mass, hemorrhage, extra-axial fluid collection, or midline shift. There is mild patchy small vessel disease in the centra semiovale bilaterally. Elsewhere gray-white compartments appear normal. No acute infarct evident. Vascular: No hyperdense vessel. There are foci of calcification in each carotid siphon region as well as in each distal vertebral artery. Skull: The bony calvarium appears  intact. Sinuses/Orbits: There is mucosal thickening and opacification in multiple ethmoid air cells. There is mucosal thickening in the anteromedial right sphenoid sinus. Visualized paranasal sinuses elsewhere are clear. Visualized orbits appear symmetric bilaterally with the exception of apparent cataract removal on the right. Other: Mastoid air cells are clear. IMPRESSION: Atrophy with relatively mild periventricular small vessel disease, stable. No intracranial mass, hemorrhage, or extra-axial fluid collection. No acute appearing infarct. Areas of arterial vascular calcification noted. There is paranasal sinus disease, primarily in multiple ethmoid air cells. Electronically Signed   By: Lowella Grip III M.D.   On: 06/15/2016 08:10   Ct Angio Neck W Or Wo Contrast  Result Date: 06/30/2016 CLINICAL DATA:  New onset right-sided facial droop and intermittent dysarthria over the last 2 days. EXAM: CT ANGIOGRAPHY HEAD AND NECK TECHNIQUE: Multidetector CT imaging of the head and neck was performed using the standard protocol during bolus administration of intravenous contrast. Multiplanar CT image reconstructions and MIPs were obtained to evaluate the vascular anatomy. Carotid stenosis measurements (when applicable) are obtained utilizing NASCET criteria, using the distal internal carotid diameter as the denominator. CONTRAST:  50 mL Isovue 370 COMPARISON:  CT head without contrast from the same day. MRI brain 06/16/2016 FINDINGS: CTA NECK FINDINGS Aortic arch: A 3 vessel arch configuration is present. Atherosclerotic changes are present at the aortic arch without significant stenosis at the great vessel origins. There is no aneurysm. Right carotid system: Common carotid artery is tortuous proximally. There is no focal stenosis. Atherosclerotic calcifications are present at the right carotid bifurcation without significant stenosis relative to the more distal vessel. Remainder of the right internal carotid  artery is normal. Left carotid system: The left common carotid artery is tortuous proximally. There is no significant stenosis. Atherosclerotic changes are present at the carotid bifurcation without significant stenosis. The more distal left internal carotid artery is normal. Vertebral arteries: Both vertebral arteries originate from the subclavian arteries without significant stenoses. The vertebral arteries are codominant. There is no significant stenosis or vascular injury to either vertebral artery in the neck. Skeleton: Mild endplate degenerative and facet changes are present. No focal lytic or blastic lesions are present. There is exaggerated thoracic kyphosis and cervical lordosis. Other neck: No focal mucosal or submucosal lesions are present. Bilateral thyroid nodules are present without a dominant lesion. There is no significant adenopathy. The salivary glands are within normal limits. Upper chest: The lung apices demonstrate bilateral dependent atelectasis. No focal nodule or mass lesion is present. Review of the MIP images confirms the above findings CTA HEAD FINDINGS Anterior circulation: Atherosclerotic calcifications are present within the cavernous internal carotid arteries bilaterally without a significant stenosis through the ICA terminus. The A1 and M1 segments are normal. The anterior communicating artery is patent. The MCA bifurcations are intact. There is segmental attenuation of ACA and MCA branch vessels bilaterally without a significant proximal stenosis or occlusion. Posterior circulation: The vertebral arteries are codominant. Atherosclerotic changes are present at the dural margin. The PICA origins are visualized and normal. The basilar artery is normal. Both posterior cerebral arteries originate from the basilar tip. There is no significant proximal stenosis  or occlusion within either posterior cerebral artery. Venous sinuses: The dural sinuses are patent. The right transverse sinus is  dominant. The straight sinus deep cerebral veins are intact. The cortical veins are unremarkable. Anatomic variants: None Delayed phase: Not performed. Arterial source images are unremarkable. Review of the MIP images confirms the above findings IMPRESSION: 1. No emerging large vessel occlusion. 2. Atherosclerotic changes at the aortic arch, carotid bifurcations, dural margin of vertebral arteries, and cavernous internal carotid arteries without significant stenoses. 3. Mild diffuse segmental branch vessel irregularity within both the anterior and posterior circulation suggesting intracranial atherosclerotic change. There is no significant proximal stenosis or occlusion. 4. Multilevel spondylosis of the cervical spine. Electronically Signed   By: San Morelle M.D.   On: 06/30/2016 17:03   Ct Chest W Contrast  Result Date: 07/01/2016 CLINICAL DATA:  Bladder cancer, non-Hodgkin's lymphoma and Barrett's esophagus. EXAM: CT CHEST, ABDOMEN, AND PELVIS WITH CONTRAST TECHNIQUE: Multidetector CT imaging of the chest, abdomen and pelvis was performed following the standard protocol during bolus administration of intravenous contrast. CONTRAST:  171mL ISOVUE-300 IOPAMIDOL (ISOVUE-300) INJECTION 61% COMPARISON:  CXR from 06/30/2016, chest CT from 09/09/2011, CT chest, abdomen and pelvis from 09/09/2011 FINDINGS: CT CHEST FINDINGS Cardiovascular: Undo top normal size cardiac chambers without pericardial effusion. No large central pulmonary embolus. Aortic atherosclerosis without aneurysm. No dissection. Mild ectasia of the ascending aorta as before up to 3.9 cm. Normal branch pattern for the great vessels. Mediastinum/Nodes: Stable subcentimeter left-sided thyroid nodules since 2013 consistent with benign findings. The largest currently is approximately 12 mm. Fat containing right lower paratracheal lymph node measuring up to 13 mm is without significant change. Stable hilar lymph nodes, the largest on the right  approximately 9 mm short axis and on the left, 8 mm short axis. Lungs/Pleura: Stable calcified and noncalcified subcentimeter pulmonary nodules dating back to 2013 consistent with benign findings, index nodule the left upper lobe measuring 7 mm, series 5, image 76. Bibasilar dependent atelectasis. There is lingular, right middle lobe and both lower lobe bronchiectasis with mild peribronchial thickening and chronic minimal parenchymal scarring and/or atelectasis. Musculoskeletal: Lower thoracic kyphosis attributable to multilevel degenerative disc disease. Small osteophytes are noted along the anterior aspect of the mid and lower thoracic spine. No acute nor suspicious osseous abnormalities. CT ABDOMEN PELVIS FINDINGS Hepatobiliary: Status post cholecystectomy. No space-occupying mass of the liver. No biliary dilatation. Pancreas: Unremarkable. No pancreatic ductal dilatation or surrounding inflammatory changes. Spleen: Normal in size without focal abnormality. Adrenals/Urinary Tract: Adrenal glands are unremarkable. Kidneys are normal, without renal calculi, focal lesion, or hydronephrosis. Bladder is unremarkable. Stomach/Bowel: Stomach is within normal limits. Appendix is surgically absent by report. No evidence of bowel wall thickening, distention, or inflammatory changes. Vascular/Lymphatic: Bandlike mesenteric soft tissue attenuation with calcification in the mid abdomen is slightly more prominent than on prior consistent with treated lymphoma. Small scattered mesenteric lymph nodes are seen in the right lower quadrant and root of the mesentery as before. No inguinal or pelvic sidewall adenopathy. No retroperitoneal or mesenteric lymphadenopathy. No porta hepatic nor epigastric enlargement of lymph nodes Reproductive: Prostate is unremarkable. Other: No abdominal wall hernia or abnormality. No abdominopelvic ascites. Musculoskeletal: No acute or significant osseous findings. Degenerative disc disease L4-5.  IMPRESSION: 1. Chronic since 2013 are stable pulmonary nodules noted bilaterally, index nodule the left upper lobe measures 7 mm consistent with benign findings. 2. Hazy mesenteric fat in the mid abdomen with calcification consistent with treated non-Hodgkin's lymphoma. No recurrence of lymphadenopathy identified within  chest, abdomen or pelvis. 3. Cholecystectomy. Electronically Signed   By: Ashley Royalty M.D.   On: 07/01/2016 20:12   Mr Brain Wo Contrast  Result Date: 07/01/2016 CLINICAL DATA:  Acute presentation with slurred speech and right-sided facial droop. Right upper and lower extremity weakness. EXAM: MRI HEAD WITHOUT CONTRAST TECHNIQUE: Multiplanar, multiecho pulse sequences of the brain and surrounding structures were obtained without intravenous contrast. COMPARISON:  Multiple CT studies done yesterday.  MRI 06/16/2016. FINDINGS: Brain: There is acute infarction affecting the left para median pons. No acute infarction affecting the cerebral hemispheres. No focal cerebellar insult. Few old small vessel infarctions within the thalami I. Moderate chronic small-vessel ischemic changes of the cerebral hemispheric white matter. No cortical or large vessel territory infarction. No mass lesion, hemorrhage, hydrocephalus or extra-axial collection. Vascular: Major vessels at the base of the brain show flow. Skull and upper cervical spine: Negative Sinuses/Orbits: Clear except for mild maxillary sinus mucosal thickening and retention cyst. Orbits negative. Other: None significant IMPRESSION: Acute infarction affecting the left para median pons. No hemorrhage. Chronic small-vessel ischemic changes elsewhere as outlined above. Electronically Signed   By: Nelson Chimes M.D.   On: 07/01/2016 07:11   Mr Brain Wo Contrast  Result Date: 06/16/2016 CLINICAL DATA:  83 y/o  M; generalized weakness and fall. EXAM: MRI HEAD WITHOUT CONTRAST TECHNIQUE: Multiplanar, multiecho pulse sequences of the brain and surrounding  structures were obtained without intravenous contrast. COMPARISON:  06/15/2016 CT of the head. FINDINGS: Brain: No acute infarction, hemorrhage, hydrocephalus, extra-axial collection or mass lesion. Foci of T2 FLAIR hyperintense signal abnormality in subcortical and periventricular white matter are nonspecific are compatible with moderate chronic microvascular ischemic changes of the brain. Mild brain parenchymal volume loss for age. Small chronic lacunar infarct within the left caudate head and hemosiderin stained chronic lacunar infarct in right lentiform nucleus. Vascular: Normal flow voids. Skull and upper cervical spine: Normal marrow signal. Sinuses/Orbits: Mild ethmoid and maxillary sinus mucosal thickening and large right maxillary sinus mucous retention cyst. No abnormal signal of the mastoid air cells. Right intra-ocular lens replacement. Other: None. IMPRESSION: 1. No acute intracranial abnormality. 2. Moderate chronic microvascular ischemic changes and mild parenchymal volume loss of the brain. Small chronic lacunar infarcts within right lentiform nucleus and left caudate head. 3. Mild paranasal sinus disease. Electronically Signed   By: Kristine Garbe M.D.   On: 06/16/2016 19:20   Mr Cervical Spine Wo Contrast  Result Date: 06/17/2016 CLINICAL DATA:  Progressive weakness.  Drooling. EXAM: MRI CERVICAL SPINE WITHOUT CONTRAST TECHNIQUE: Multiplanar, multisequence MR imaging of the cervical spine was performed. No intravenous contrast was administered. COMPARISON:  None. FINDINGS: Alignment: Exaggerated lordosis.  No listhesis. Vertebrae: No fracture, evidence of discitis, or bone lesion. Mild marrow edema about the left C4-5 facet, degenerative appearing. Cord: Normal signal and morphology. Posterior Fossa, vertebral arteries, paraspinal tissues: Bilateral thyroid nodules, up to 12 mm on the left, below size threshold for recommend sonographic follow-up. Disc levels: C2-3: Facet arthropathy  with moderate spurring on the left. Ligamentum flavum thickening. Patent canal and foramina C3-4: Facet arthropathy with spurring greater on the left. Ligamentum flavum thickening. Mild endplate ridging. Patent canal and foramina C4-5: Facet arthropathy with moderate bilateral spurring. Ligamentum flavum thickening. No impingement C5-6: Facet arthropathy with mild spurring. Ligamentum flavum thickening. Disc narrowing with posterior disc osteophyte complex. No impingement C6-7: Mild facet spurring.  Mild disc narrowing.  No impingement C7-T1:Mild facet arthropathy with possible ankylosis on the right. Mild disc narrowing. No  impingement IMPRESSION: 1. No explanation for weakness. The canal and foramina are diffusely patent. No evidence of myelopathy. 2. Facet arthropathy and mild for age disc degeneration. Mild degenerative marrow edema noted about the left C4-5 facet. Electronically Signed   By: Monte Fantasia M.D.   On: 06/17/2016 12:41   Ct Abdomen Pelvis W Contrast  Result Date: 07/01/2016 CLINICAL DATA:  Bladder cancer, non-Hodgkin's lymphoma and Barrett's esophagus. EXAM: CT CHEST, ABDOMEN, AND PELVIS WITH CONTRAST TECHNIQUE: Multidetector CT imaging of the chest, abdomen and pelvis was performed following the standard protocol during bolus administration of intravenous contrast. CONTRAST:  188mL ISOVUE-300 IOPAMIDOL (ISOVUE-300) INJECTION 61% COMPARISON:  CXR from 06/30/2016, chest CT from 09/09/2011, CT chest, abdomen and pelvis from 09/09/2011 FINDINGS: CT CHEST FINDINGS Cardiovascular: Undo top normal size cardiac chambers without pericardial effusion. No large central pulmonary embolus. Aortic atherosclerosis without aneurysm. No dissection. Mild ectasia of the ascending aorta as before up to 3.9 cm. Normal branch pattern for the great vessels. Mediastinum/Nodes: Stable subcentimeter left-sided thyroid nodules since 2013 consistent with benign findings. The largest currently is approximately 12 mm.  Fat containing right lower paratracheal lymph node measuring up to 13 mm is without significant change. Stable hilar lymph nodes, the largest on the right approximately 9 mm short axis and on the left, 8 mm short axis. Lungs/Pleura: Stable calcified and noncalcified subcentimeter pulmonary nodules dating back to 2013 consistent with benign findings, index nodule the left upper lobe measuring 7 mm, series 5, image 76. Bibasilar dependent atelectasis. There is lingular, right middle lobe and both lower lobe bronchiectasis with mild peribronchial thickening and chronic minimal parenchymal scarring and/or atelectasis. Musculoskeletal: Lower thoracic kyphosis attributable to multilevel degenerative disc disease. Small osteophytes are noted along the anterior aspect of the mid and lower thoracic spine. No acute nor suspicious osseous abnormalities. CT ABDOMEN PELVIS FINDINGS Hepatobiliary: Status post cholecystectomy. No space-occupying mass of the liver. No biliary dilatation. Pancreas: Unremarkable. No pancreatic ductal dilatation or surrounding inflammatory changes. Spleen: Normal in size without focal abnormality. Adrenals/Urinary Tract: Adrenal glands are unremarkable. Kidneys are normal, without renal calculi, focal lesion, or hydronephrosis. Bladder is unremarkable. Stomach/Bowel: Stomach is within normal limits. Appendix is surgically absent by report. No evidence of bowel wall thickening, distention, or inflammatory changes. Vascular/Lymphatic: Bandlike mesenteric soft tissue attenuation with calcification in the mid abdomen is slightly more prominent than on prior consistent with treated lymphoma. Small scattered mesenteric lymph nodes are seen in the right lower quadrant and root of the mesentery as before. No inguinal or pelvic sidewall adenopathy. No retroperitoneal or mesenteric lymphadenopathy. No porta hepatic nor epigastric enlargement of lymph nodes Reproductive: Prostate is unremarkable. Other: No  abdominal wall hernia or abnormality. No abdominopelvic ascites. Musculoskeletal: No acute or significant osseous findings. Degenerative disc disease L4-5. IMPRESSION: 1. Chronic since 2013 are stable pulmonary nodules noted bilaterally, index nodule the left upper lobe measures 7 mm consistent with benign findings. 2. Hazy mesenteric fat in the mid abdomen with calcification consistent with treated non-Hodgkin's lymphoma. No recurrence of lymphadenopathy identified within chest, abdomen or pelvis. 3. Cholecystectomy. Electronically Signed   By: Ashley Royalty M.D.   On: 07/01/2016 20:12   Ct Cerebral Perfusion W Contrast  Result Date: 06/30/2016 CLINICAL DATA:  Dysarthria.  Right-sided facial droop. EXAM: CT PERFUSION BRAIN TECHNIQUE: Multiphase CT imaging of the brain was performed following IV bolus contrast injection. Subsequent parametric perfusion maps were calculated using RAPID software. CONTRAST:  40 mL Isovue 370 COMPARISON:  None. FINDINGS: CT Brain Perfusion  Findings: CBF (<30%) Volume: 58mL Perfusion (Tmax>6.0s) volume: 68mL Mismatch Volume: 29mL Infarction Location:None No significant patient motion is present. Arterial input and venous output functions are excellent. IMPRESSION: Negative CT perfusion exam of the head. No evidence for acute infarct or ischemia. Electronically Signed   By: San Morelle M.D.   On: 06/30/2016 16:54   Dg Swallowing Func-speech Pathology  Result Date: 07/01/2016 Objective Swallowing Evaluation: Type of Study: MBS-Modified Barium Swallow Study Patient Details Name: Jason [name] MRN: [id number] Date of Birth: Aug 01, 1927 Today's Date: 07/01/2016 Time: SLP Start Time (ACUTE ONLY): 0910-SLP Stop Time (ACUTE ONLY): 0933 SLP Time Calculation (min) (ACUTE ONLY): 23 min Past Medical History: Past Medical History: Diagnosis Date . Arthritis  . Barrett esophagus  . Bladder cancer ([place]) dx'd 6606,3016  surg only . Colon polyps   adenomatous . Difficulty sleeping    OCCASIONALLY . Diverticulosis of colon (without mention of hemorrhage)  . Frequency of urination  . GERD (gastroesophageal reflux disease)  . Hiatal hernia  . History of bladder cancer 1981  EXCISION ONLY . History of skin cancer   BASAL CELL . Hyperlipidemia  . Lymphoma, non-Hodgkin's ([place]) 2011  [place]  . Macular degeneration   LEFT EYE . Multiple nodules of lung 09/16/2011 . PAF (paroxysmal atrial fibrillation) ([place])  . Personal history of colonic polyps 07/15/2010  tubular adenoma . Prostate cancer [facility])   patient denies . PUD (peptic ulcer disease)  . Stomach cancer ([place])   NHL origin Past Surgical History: Past Surgical History: Procedure Laterality Date . APPENDECTOMY   . bone spur    left shoulder . CHOLECYSTECTOMY   . KNEE ARTHROPLASTY Left 07/30/2015  Procedure: LEFT TOTAL KNEE ARTHROPLASTY WITH COMPUTER NAVIGATION;  Surgeon: Rod Can, MD;  Location: [facility];  Service: Orthopedics;  Laterality: Left; . LASER OF PROSTATE W/ GREEN LIGHT PVP   . TENDON REPAIR    right arm . TRIGGER FINGER RELEASE    bilateral hands . WRIST SURGERY    Bilateral HPI: Ptis an 83 y.o. malewith PMH significant of PAF, Barrett's esophagus, bladder cancer, non-Hodgkin's lymphoma, OA, andHLD; who presents with acute onset of slurred speech, right-sided facial droop, and right sided upper and lower extremity weakness. He had just been recently hospitalized from 3/14 -3/19 for community-acquired pneumonia. Due to swallowing difficulties patient was evaluated by speech therapy and seen to be silently aspirating. Patient was placed on modified diet (recommended Dys 3 solids, thin liquids). Upon discharge he was sent to a skilled nursing facilityfor rehabilitation. 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 No hemorrhage. Chronic small-vessel ischemic changes elsewhere. CXR showed some improvement in the appearance of the lungs especially upper lobes but coarse interstitial markings  persist in the lower lobes. There is no discrete pneumonia. There is stablecardiomegaly without pulmonary vascular congestion.Thoracic aortic atherosclerosis. No Data Recorded Assessment / Plan / Recommendation CHL IP CLINICAL IMPRESSIONS 07/01/2016 Clinical Impression Pt has had a decline in swallow function this admission following acute pontine CVA. Impairments primarily motor based with decreased laryngeal elevation, laryngeal closure and reduced tongue base retraction. Silent penetration and aspiration during initial and multiple swallows with nectar and honey thick consistencies. Chin tuck was ineffective to mitigate laryngeal invasion or to decrease vallecular residue during swallow. Pt required multiple verbal prompts for cough/throat clear which were weak and unable to clear vestibule. Although puree was not penetrated or aspirated, volume was significant and remained increasing risk. MBS does not diagnose below the level  of the UES, however esophageal scan revealed stasis in distal esophagus. Recommend NPO status, oral care, short term alternative nutrition and continue ST intervention. From ST/swallow standpoint, pt is an excellent candidate for inpatient rehab.   SLP Visit Diagnosis Dysphagia, oropharyngeal phase (R13.12) Attention and concentration deficit following -- Frontal lobe and executive function deficit following -- Impact on safety and function --   CHL IP TREATMENT RECOMMENDATION 07/01/2016 Treatment Recommendations Therapy as outlined in treatment plan below   Prognosis 07/01/2016 Prognosis for Safe Diet Advancement Good Barriers to Reach Goals -- Barriers/Prognosis Comment -- CHL IP DIET RECOMMENDATION 07/01/2016 SLP Diet Recommendations NPO Liquid Administration via -- Medication Administration Via alternative means Compensations -- Postural Changes --   CHL IP OTHER RECOMMENDATIONS 07/01/2016 Recommended Consults -- Oral Care Recommendations Oral care QID Other Recommendations --   CHL IP  FOLLOW UP RECOMMENDATIONS 07/01/2016 Follow up Recommendations Inpatient Rehab   CHL IP FREQUENCY AND DURATION 07/01/2016 Speech Therapy Frequency (ACUTE ONLY) min 2x/week Treatment Duration 2 weeks      CHL IP ORAL PHASE 07/01/2016 Oral Phase Impaired Oral - Pudding Teaspoon -- Oral - Pudding Cup -- Oral - Honey Teaspoon -- Oral - Honey Cup Lingual/palatal residue Oral - Nectar Teaspoon -- Oral - Nectar Cup Lingual/palatal residue Oral - Nectar Straw -- Oral - Thin Teaspoon -- Oral - Thin Cup Lingual/palatal residue Oral - Thin Straw -- Oral - Puree Lingual/palatal residue Oral - Mech Soft NT Oral - Regular -- Oral - Multi-Consistency -- Oral - Pill -- Oral Phase - Comment --  CHL IP PHARYNGEAL PHASE 07/01/2016 Pharyngeal Phase Impaired Pharyngeal- Pudding Teaspoon -- Pharyngeal -- Pharyngeal- Pudding Cup -- Pharyngeal -- Pharyngeal- Honey Teaspoon -- Pharyngeal -- Pharyngeal- Honey Cup Pharyngeal residue - valleculae;Reduced tongue base retraction;Penetration/Aspiration during swallow;Reduced laryngeal elevation;Reduced airway/laryngeal closure Pharyngeal Material enters airway, passes BELOW cords without attempt by patient to eject out (silent aspiration);Material enters airway, CONTACTS cords and not ejected out Pharyngeal- Nectar Teaspoon -- Pharyngeal -- Pharyngeal- Nectar Cup Delayed swallow initiation-vallecula;Pharyngeal residue - valleculae;Reduced tongue base retraction;Reduced laryngeal elevation;Penetration/Aspiration during swallow;Reduced airway/laryngeal closure;Pharyngeal residue - pyriform Pharyngeal Material enters airway, passes BELOW cords without attempt by patient to eject out (silent aspiration);Material enters airway, CONTACTS cords and not ejected out Pharyngeal- Nectar Straw -- Pharyngeal -- Pharyngeal- Thin Teaspoon -- Pharyngeal -- Pharyngeal- Thin Cup NT Pharyngeal -- Pharyngeal- Thin Straw -- Pharyngeal -- Pharyngeal- Puree Pharyngeal residue - valleculae;Reduced tongue base retraction  Pharyngeal -- Pharyngeal- Mechanical Soft NT Pharyngeal -- Pharyngeal- Regular -- Pharyngeal -- Pharyngeal- Multi-consistency -- Pharyngeal -- Pharyngeal- Pill -- Pharyngeal -- Pharyngeal Comment --  CHL IP CERVICAL ESOPHAGEAL PHASE 07/01/2016 Cervical Esophageal Phase WFL Pudding Teaspoon -- Pudding Cup -- Honey Teaspoon -- Honey Cup -- Nectar Teaspoon -- Nectar Cup -- Nectar Straw -- Thin Teaspoon -- Thin Cup -- Thin Straw -- Puree -- Mechanical Soft -- Regular -- Multi-consistency -- Pill -- Cervical Esophageal Comment -- CHL IP GO 06/16/2016 Functional Assessment Tool Used skilled clinical judgement Functional Limitations Swallowing Swallow Current Status (W2376) CK Swallow Goal Status (E8315) CJ Swallow Discharge Status (V7616) (None) Motor Speech Current Status (W7371) (None) Motor Speech Goal Status (G6269) (None) Motor Speech Goal Status (S8546) (None) Spoken Language Comprehension Current Status (E7035) (None) Spoken Language Comprehension Goal Status (K0938) (None) Spoken Language Comprehension Discharge Status (H8299) (None) Spoken Language Expression Current Status (B7169) (None) Spoken Language Expression Goal Status (C7893) (None) Spoken Language Expression Discharge Status (Y1017) (None) Attention Current Status (P1025) (None) Attention Goal Status (E5277) (None) Attention  Discharge Status [phone]) (None) Memory Current Status [phone]) (None) Memory Goal Status (A8341) (None) Memory Discharge Status (D6222) (None) Voice Current Status (L7989) (None) Voice Goal Status (Q1194) (None) Voice Discharge Status (R7408) (None) Other Speech-Language Pathology Functional Limitation Current Status (X4481) (None) Other Speech-Language Pathology Functional Limitation Goal Status (E5631) (None) Other Speech-Language Pathology Functional Limitation Discharge Status [phone]) (None) [name] 07/01/2016, 11:26 AM [name] [name].Ed CCC-SLP Pager [phone]              Dg Swallowing Func-speech  Pathology  Result Date: 06/17/2016 Objective Swallowing Evaluation: Type of Study: MBS-Modified Barium Swallow Study Patient Details Name: Jason [name] MRN: [id number] Date of Birth: Oct 24, 1927 Today's Date: 06/17/2016 Time: SLP Start Time (ACUTE ONLY): 1230-SLP Stop Time (ACUTE ONLY): 1300 SLP Time Calculation (min) (ACUTE ONLY): 30 min Past Medical History: Past Medical History: Diagnosis Date . Arthritis  . Barrett esophagus  . Bladder cancer ([place]) dx'd 7867,6720  surg only . Colon polyps   adenomatous . Difficulty sleeping   OCCASIONALLY . Diverticulosis of colon (without mention of hemorrhage)  . Frequency of urination  . GERD (gastroesophageal reflux disease)  . Hiatal hernia  . History of bladder cancer 1981  EXCISION ONLY . History of skin cancer   BASAL CELL . Hyperlipidemia  . Lymphoma, non-Hodgkin's ([place]) 2011  [place]  . Macular degeneration   LEFT EYE . Multiple nodules of lung 09/16/2011 . PAF (paroxysmal atrial fibrillation) ([place])  . Personal history of colonic polyps 07/15/2010  tubular adenoma . Prostate cancer [facility])   patient denies . PUD (peptic ulcer disease)  . Stomach cancer ([place])   NHL origin Past Surgical History: Past Surgical History: Procedure Laterality Date . APPENDECTOMY   . bone spur    left shoulder . CHOLECYSTECTOMY   . KNEE ARTHROPLASTY Left 07/30/2015  Procedure: LEFT TOTAL KNEE ARTHROPLASTY WITH COMPUTER NAVIGATION;  Surgeon: Rod Can, MD;  Location: [facility];  Service: Orthopedics;  Laterality: Left; . LASER OF PROSTATE W/ GREEN LIGHT PVP   . TENDON REPAIR    right arm . TRIGGER FINGER RELEASE    bilateral hands . WRIST SURGERY    Bilateral HPI: Keltin Baird Wilsonis a 83 y.o.malewith medical history significant for Paroxysmal AFib, GERD, hiatal hernia, Barrett's esophagus, multiple lung nodules, bladder CA, non-Hodgkin's lymphoma, OA, hyperlipidemia who presented to the ED with c/o generalized weakness and fall. Chest x-ray showed possible CHF with superimposed pneumonia  in the middle left lung base. Head CT showed no acute abnormalities, small vessel ischemic disease white matter changes,and ethmoidal paranasal sinus disease. Barium esophagram poor esophageal motility. No stricture or mass. Barium tablet passed readily into the stomach. Negative for reflux. No Data Recorded Assessment / Plan / Recommendation CHL IP CLINICAL IMPRESSIONS 06/17/2016 Clinical Impression  Pt demonstrates a moderate oropharyngeal dysphagia with neuromuscular deficits impacting sensory and motor function. Pt observed to have bilateral ptosis, anterior spillage of secretions on the left (family reports history of left facial droop due to Bell's palsy) and weak cough strength upon arrival to exam. Despite deficits pt apepars conginitively intact and is able to follow complex commands.  Oral function chaacteraized by mild lingual weakness observed via mild lingual residuals, with functional abiltiy to masticate and transit soft solids (though endurance not taxed). Flash penetration and silent aspriation during the swallow occured with boluses larger than 22ml (estimate) due to slow laryngeal closure and epiglottic deflection. Pt was able trigger a timely swallow and eventually achieve full laryngeal closure which expelled  penetrate when possible, but again sluggish movement kept vestible partialy open during bolus passage. Further silent aspiration events also occured after the swallow if pt was cued to cough, which spilled valleuular residual into the airway. Attempted chin tuck without improvement and mendelsohn with visual biofeedback (pt unable after 5 attempts with max cues). Best methods included verbal cues for small sips (bolus size consistently comparable in appearance and fucntion with 81mL teaspoon), oral hold and effortful swallow x2. Given increased residuals with thicker textures, recommend pt initiate a dys 2/thin liquid diet. Recommend water only with oral care at least 3x day with toothbrush  and paste. Pt will benefit from exercises to target hyolaryngeal excursion and base of tongue strength as well as EMST. Will f/u for education with family.  SLP Visit Diagnosis Dysphagia, oropharyngeal phase (R13.12) Attention and concentration deficit following -- Frontal lobe and executive function deficit following -- Impact on safety and function --   CHL IP TREATMENT RECOMMENDATION 06/17/2016 Treatment Recommendations Therapy as outlined in treatment plan below   Prognosis 06/17/2016 Prognosis for Safe Diet Advancement Good Barriers to Reach Goals -- Barriers/Prognosis Comment -- CHL IP DIET RECOMMENDATION 06/17/2016 SLP Diet Recommendations Dysphagia 2 (Fine chop) solids;Thin liquid Liquid Administration via Cup Medication Administration Whole meds with puree Compensations Slow rate;Small sips/bites;Effortful swallow;Minimize environmental distractions;Clear throat intermittently Postural Changes Remain semi-upright after after feeds/meals (Comment)   CHL IP OTHER RECOMMENDATIONS 06/17/2016 Recommended Consults -- Oral Care Recommendations Oral care before and after PO Other Recommendations --   CHL IP FOLLOW UP RECOMMENDATIONS 06/17/2016 Follow up Recommendations 24 hour supervision/assistance   CHL IP FREQUENCY AND DURATION 06/17/2016 Speech Therapy Frequency (ACUTE ONLY) min 3x week Treatment Duration 2 weeks      CHL IP ORAL PHASE 06/17/2016 Oral Phase Impaired Oral - Pudding Teaspoon -- Oral - Pudding Cup -- Oral - Honey Teaspoon -- Oral - Honey Cup -- Oral - Nectar Teaspoon -- Oral - Nectar Cup -- Oral - Nectar Straw -- Oral - Thin Teaspoon -- Oral - Thin Cup Left anterior bolus loss;Lingual/palatal residue Oral - Thin Straw -- Oral - Puree WFL Oral - Mech Soft WFL Oral - Regular -- Oral - Multi-Consistency -- Oral - Pill -- Oral Phase - Comment --  CHL IP PHARYNGEAL PHASE 06/17/2016 Pharyngeal Phase Impaired Pharyngeal- Pudding Teaspoon -- Pharyngeal -- Pharyngeal- Pudding Cup -- Pharyngeal -- Pharyngeal- Honey  Teaspoon -- Pharyngeal -- Pharyngeal- Honey Cup -- Pharyngeal -- Pharyngeal- Nectar Teaspoon -- Pharyngeal -- Pharyngeal- Nectar Cup -- Pharyngeal -- Pharyngeal- Nectar Straw -- Pharyngeal -- Pharyngeal- Thin Teaspoon -- Pharyngeal -- Pharyngeal- Thin Cup Reduced anterior laryngeal mobility;Penetration/Aspiration during swallow;Reduced tongue base retraction;Penetration/Apiration after swallow;Moderate aspiration;Trace aspiration;Pharyngeal residue - valleculae;Compensatory strategies attempted (with notebox) Pharyngeal Material enters airway, passes BELOW cords without attempt by patient to eject out (silent aspiration);Material enters airway, CONTACTS cords and then ejected out;Material does not enter airway Pharyngeal- Thin Straw -- Pharyngeal -- Pharyngeal- Puree Reduced anterior laryngeal mobility;Reduced tongue base retraction;Pharyngeal residue - valleculae;Compensatory strategies attempted (with notebox) Pharyngeal -- Pharyngeal- Mechanical Soft Reduced anterior laryngeal mobility;Reduced tongue base retraction;Pharyngeal residue - valleculae;Compensatory strategies attempted (with notebox) Pharyngeal -- Pharyngeal- Regular -- Pharyngeal -- Pharyngeal- Multi-consistency -- Pharyngeal -- Pharyngeal- Pill -- Pharyngeal -- Pharyngeal Comment --  No flowsheet data found. CHL IP GO 06/16/2016 Functional Assessment Tool Used skilled clinical judgement Functional Limitations Swallowing Swallow Current Status (C7893) CK Swallow Goal Status (Y1017) [place] Swallow Discharge Status [phone]) (None) Motor Speech Current Status [phone]) (None) Motor Speech Goal Status (O2423) (None)  Motor Speech Goal Status [phone]) (None) Spoken Language Comprehension Current Status [phone]) (None) Spoken Language Comprehension Goal Status [phone]) (None) Spoken Language Comprehension Discharge Status [phone]) (None) Spoken Language Expression Current Status [phone]) (None) Spoken Language Expression Goal Status [phone]) (None) Spoken Language  Expression Discharge Status [phone]) (None) Attention Current Status (Z8588) (None) Attention Goal Status (F0277) (None) Attention Discharge Status [phone]) (None) Memory Current Status (O6767) (None) Memory Goal Status (M0947) (None) Memory Discharge Status (S9628) (None) Voice Current Status (Z6629) (None) Voice Goal Status (U7654) (None) Voice Discharge Status (Y5035) (None) Other Speech-Language Pathology Functional Limitation Current Status (W6568) (None) Other Speech-Language Pathology Functional Limitation Goal Status (L2751) (None) Other Speech-Language Pathology Functional Limitation Discharge Status [phone]) (None) De[place], Katherene Ponto 06/17/2016, 2:21 PM              Ct Head Code Stroke W/o Cm  Addendum Date: 06/30/2016   ADDENDUM REPORT: 06/30/2016 16:06 ADDENDUM: Study discussed by telephone on 06/30/2016 with Dr. Harrell Gave Tegeler, at 1534 hours. Electronically Signed   By: Genevie Ann M.D.   On: 06/30/2016 16:06   Result Date: 06/30/2016 CLINICAL DATA:  Code stroke. 83 year old male with episode of right leg weakness slurred speech and right facial droop which lasted about 1 hour today. Facial droop persists but other symptoms have resolved. Initial encounter. EXAM: CT HEAD WITHOUT CONTRAST TECHNIQUE: Contiguous axial images were obtained from the base of the skull through the vertex without intravenous contrast. COMPARISON:  Brain MRI 06/16/2016 and earlier. FINDINGS: Brain: Stable cerebral volume. No midline shift, mass effect, or evidence of intracranial mass lesion. No ventriculomegaly. No acute intracranial hemorrhage identified. Stable gray-white matter differentiation throughout the brain. Chronic white matter hypodensity appears stable. No cortically based acute infarct identified. Vascular: Calcified atherosclerosis at the skull base. No suspicious intracranial vascular hyperdensity. Skull: No acute osseous abnormality identified. Sinuses/Orbits: Visualized paranasal sinuses and mastoids  are stable and well pneumatized. Other: No acute orbit or scalp soft tissue findings. ASPECTS ([place] Stroke Program Early CT Score) - Ganglionic level infarction (caudate, lentiform nuclei, internal capsule, insula, M1-M3 cortex): 7 - Supraganglionic infarction (M4-M6 cortex): 3 Total score (0-10 with 10 being normal): 10 IMPRESSION: 1. Stable non contrast CT appearance of the brain, no acute intracranial abnormality. 2. ASPECTS is 10. 3. Chronic white matter changes. Electronically Signed: By: Genevie Ann M.D. On: 06/30/2016 15:24     CBC  Recent Labs Lab 06/30/16 1524 06/30/16 1530 07/02/16 0403  WBC 8.2  --  6.2  HGB 12.1* 12.9* 11.9*  HCT 38.3* 38.0* 37.4*  PLT 194  --  160  MCV 85.5  --  84.6  MCH 27.0  --  26.9  MCHC 31.6  --  31.8  RDW 13.5  --  13.4  LYMPHSABS 1.7  --   --   MONOABS 0.5  --   --   EOSABS 0.1  --   --   BASOSABS 0.0  --   --     Chemistries   Recent Labs Lab 06/30/16 1524 06/30/16 1530  NA 140 142  K 3.9 3.8  CL 106 105  CO2 25  --   GLUCOSE 103* 98  BUN 28* 30*  CREATININE 1.21 1.10  CALCIUM 9.1  --   AST 23  --   ALT 12*  --   ALKPHOS 30*  --   BILITOT 0.8  --    ------------------------------------------------------------------------------------------------------------------ estimated creatinine clearance is 57.3 mL/min (by C-G formula based on SCr of 1.1 mg/dL). ------------------------------------------------------------------------------------------------------------------  Recent Labs  07/01/16 0746  HGBA1C 5.5   ------------------------------------------------------------------------------------------------------------------  Recent Labs  07/01/16 0746  CHOL 123  HDL 45  LDLCALC 68  TRIG 52  CHOLHDL 2.7   ------------------------------------------------------------------------------------------------------------------ No results for input(s): TSH, T4TOTAL, T3FREE, THYROIDAB in the last 72 hours.  Invalid input(s):  FREET3 ------------------------------------------------------------------------------------------------------------------  Recent Labs  07/01/16 0746  VITAMINB12 1,101*    Coagulation profile  Recent Labs Lab 06/30/16 1524  INR 1.58    No results for input(s): DDIMER in the last 72 hours.  Cardiac Enzymes No results for input(s): CKMB, TROPONINI, MYOGLOBIN in the last 168 hours.  Invalid input(s): CK ------------------------------------------------------------------------------------------------------------------ Invalid input(s): POCBNP   CBG:  Recent Labs Lab 06/30/16 2146  GLUCAP 90       Studies: Ct Angio Head W Or Wo Contrast  Result Date: 06/30/2016 CLINICAL DATA:  New onset right-sided facial droop and intermittent dysarthria over the last 2 days. EXAM: CT ANGIOGRAPHY HEAD AND NECK TECHNIQUE: Multidetector CT imaging of the head and neck was performed using the standard protocol during bolus administration of intravenous contrast. Multiplanar CT image reconstructions and MIPs were obtained to evaluate the vascular anatomy. Carotid stenosis measurements (when applicable) are obtained utilizing NASCET criteria, using the distal internal carotid diameter as the denominator. CONTRAST:  50 mL Isovue 370 COMPARISON:  CT head without contrast from the same day. MRI brain 06/16/2016 FINDINGS: CTA NECK FINDINGS Aortic arch: A 3 vessel arch configuration is present. Atherosclerotic changes are present at the aortic arch without significant stenosis at the great vessel origins. There is no aneurysm. Right carotid system: Common carotid artery is tortuous proximally. There is no focal stenosis. Atherosclerotic calcifications are present at the right carotid bifurcation without significant stenosis relative to the more distal vessel. Remainder of the right internal carotid artery is normal. Left carotid system: The left common carotid artery is tortuous proximally. There is no  significant stenosis. Atherosclerotic changes are present at the carotid bifurcation without significant stenosis. The more distal left internal carotid artery is normal. Vertebral arteries: Both vertebral arteries originate from the subclavian arteries without significant stenoses. The vertebral arteries are codominant. There is no significant stenosis or vascular injury to either vertebral artery in the neck. Skeleton: Mild endplate degenerative and facet changes are present. No focal lytic or blastic lesions are present. There is exaggerated thoracic kyphosis and cervical lordosis. Other neck: No focal mucosal or submucosal lesions are present. Bilateral thyroid nodules are present without a dominant lesion. There is no significant adenopathy. The salivary glands are within normal limits. Upper chest: The lung apices demonstrate bilateral dependent atelectasis. No focal nodule or mass lesion is present. Review of the MIP images confirms the above findings CTA HEAD FINDINGS Anterior circulation: Atherosclerotic calcifications are present within the cavernous internal carotid arteries bilaterally without a significant stenosis through the ICA terminus. The A1 and M1 segments are normal. The anterior communicating artery is patent. The MCA bifurcations are intact. There is segmental attenuation of ACA and MCA branch vessels bilaterally without a significant proximal stenosis or occlusion. Posterior circulation: The vertebral arteries are codominant. Atherosclerotic changes are present at the dural margin. The PICA origins are visualized and normal. The basilar artery is normal. Both posterior cerebral arteries originate from the basilar tip. There is no significant proximal stenosis or occlusion within either posterior cerebral artery. Venous sinuses: The dural sinuses are patent. The right transverse sinus is dominant. The straight sinus deep cerebral veins are intact. The cortical veins are unremarkable. Anatomic  variants: None Delayed  phase: Not performed. Arterial source images are unremarkable. Review of the MIP images confirms the above findings IMPRESSION: 1. No emerging large vessel occlusion. 2. Atherosclerotic changes at the aortic arch, carotid bifurcations, dural margin of vertebral arteries, and cavernous internal carotid arteries without significant stenoses. 3. Mild diffuse segmental branch vessel irregularity within both the anterior and posterior circulation suggesting intracranial atherosclerotic change. There is no significant proximal stenosis or occlusion. 4. Multilevel spondylosis of the cervical spine. Electronically Signed   By: San Morelle M.D.   On: 06/30/2016 17:03   Dg Chest 2 View  Result Date: 06/30/2016 CLINICAL DATA:  Cough, recent episode of pneumonia, stroke symptoms. History of bladder malignancy EXAM: CHEST  2 VIEW COMPARISON:  Chest x-ray of June 15, 2016 FINDINGS: The lungs are adequately inflated. The interstitial markings are coarse in the mid and lower lung zones but have improved in the upper lobes. The cardiac silhouette remains enlarged. The pulmonary vascularity is not engorged. There is no pleural effusion. There is calcification in the wall of the thoracic aorta. There is multilevel degenerative disc disease of the thoracic spine. IMPRESSION: There has been some improvement in the appearance of the lungs especially upper lobes but coarse interstitial markings persist in the lower lobes. There is no discrete pneumonia. There is stable cardiomegaly without pulmonary vascular congestion. Thoracic aortic atherosclerosis. Electronically Signed   By: David  Martinique M.D.   On: 06/30/2016 15:47   Ct Angio Neck W Or Wo Contrast  Result Date: 06/30/2016 CLINICAL DATA:  New onset right-sided facial droop and intermittent dysarthria over the last 2 days. EXAM: CT ANGIOGRAPHY HEAD AND NECK TECHNIQUE: Multidetector CT imaging of the head and neck was performed using the  standard protocol during bolus administration of intravenous contrast. Multiplanar CT image reconstructions and MIPs were obtained to evaluate the vascular anatomy. Carotid stenosis measurements (when applicable) are obtained utilizing NASCET criteria, using the distal internal carotid diameter as the denominator. CONTRAST:  50 mL Isovue 370 COMPARISON:  CT head without contrast from the same day. MRI brain 06/16/2016 FINDINGS: CTA NECK FINDINGS Aortic arch: A 3 vessel arch configuration is present. Atherosclerotic changes are present at the aortic arch without significant stenosis at the great vessel origins. There is no aneurysm. Right carotid system: Common carotid artery is tortuous proximally. There is no focal stenosis. Atherosclerotic calcifications are present at the right carotid bifurcation without significant stenosis relative to the more distal vessel. Remainder of the right internal carotid artery is normal. Left carotid system: The left common carotid artery is tortuous proximally. There is no significant stenosis. Atherosclerotic changes are present at the carotid bifurcation without significant stenosis. The more distal left internal carotid artery is normal. Vertebral arteries: Both vertebral arteries originate from the subclavian arteries without significant stenoses. The vertebral arteries are codominant. There is no significant stenosis or vascular injury to either vertebral artery in the neck. Skeleton: Mild endplate degenerative and facet changes are present. No focal lytic or blastic lesions are present. There is exaggerated thoracic kyphosis and cervical lordosis. Other neck: No focal mucosal or submucosal lesions are present. Bilateral thyroid nodules are present without a dominant lesion. There is no significant adenopathy. The salivary glands are within normal limits. Upper chest: The lung apices demonstrate bilateral dependent atelectasis. No focal nodule or mass lesion is present. Review  of the MIP images confirms the above findings CTA HEAD FINDINGS Anterior circulation: Atherosclerotic calcifications are present within the cavernous internal carotid arteries bilaterally without a significant stenosis through  the ICA terminus. The A1 and M1 segments are normal. The anterior communicating artery is patent. The MCA bifurcations are intact. There is segmental attenuation of ACA and MCA branch vessels bilaterally without a significant proximal stenosis or occlusion. Posterior circulation: The vertebral arteries are codominant. Atherosclerotic changes are present at the dural margin. The PICA origins are visualized and normal. The basilar artery is normal. Both posterior cerebral arteries originate from the basilar tip. There is no significant proximal stenosis or occlusion within either posterior cerebral artery. Venous sinuses: The dural sinuses are patent. The right transverse sinus is dominant. The straight sinus deep cerebral veins are intact. The cortical veins are unremarkable. Anatomic variants: None Delayed phase: Not performed. Arterial source images are unremarkable. Review of the MIP images confirms the above findings IMPRESSION: 1. No emerging large vessel occlusion. 2. Atherosclerotic changes at the aortic arch, carotid bifurcations, dural margin of vertebral arteries, and cavernous internal carotid arteries without significant stenoses. 3. Mild diffuse segmental branch vessel irregularity within both the anterior and posterior circulation suggesting intracranial atherosclerotic change. There is no significant proximal stenosis or occlusion. 4. Multilevel spondylosis of the cervical spine. Electronically Signed   By: San Morelle M.D.   On: 06/30/2016 17:03   Ct Chest W Contrast  Result Date: 07/01/2016 CLINICAL DATA:  Bladder cancer, non-Hodgkin's lymphoma and Barrett's esophagus. EXAM: CT CHEST, ABDOMEN, AND PELVIS WITH CONTRAST TECHNIQUE: Multidetector CT imaging of the  chest, abdomen and pelvis was performed following the standard protocol during bolus administration of intravenous contrast. CONTRAST:  139mL ISOVUE-300 IOPAMIDOL (ISOVUE-300) INJECTION 61% COMPARISON:  CXR from 06/30/2016, chest CT from 09/09/2011, CT chest, abdomen and pelvis from 09/09/2011 FINDINGS: CT CHEST FINDINGS Cardiovascular: Undo top normal size cardiac chambers without pericardial effusion. No large central pulmonary embolus. Aortic atherosclerosis without aneurysm. No dissection. Mild ectasia of the ascending aorta as before up to 3.9 cm. Normal branch pattern for the great vessels. Mediastinum/Nodes: Stable subcentimeter left-sided thyroid nodules since 2013 consistent with benign findings. The largest currently is approximately 12 mm. Fat containing right lower paratracheal lymph node measuring up to 13 mm is without significant change. Stable hilar lymph nodes, the largest on the right approximately 9 mm short axis and on the left, 8 mm short axis. Lungs/Pleura: Stable calcified and noncalcified subcentimeter pulmonary nodules dating back to 2013 consistent with benign findings, index nodule the left upper lobe measuring 7 mm, series 5, image 76. Bibasilar dependent atelectasis. There is lingular, right middle lobe and both lower lobe bronchiectasis with mild peribronchial thickening and chronic minimal parenchymal scarring and/or atelectasis. Musculoskeletal: Lower thoracic kyphosis attributable to multilevel degenerative disc disease. Small osteophytes are noted along the anterior aspect of the mid and lower thoracic spine. No acute nor suspicious osseous abnormalities. CT ABDOMEN PELVIS FINDINGS Hepatobiliary: Status post cholecystectomy. No space-occupying mass of the liver. No biliary dilatation. Pancreas: Unremarkable. No pancreatic ductal dilatation or surrounding inflammatory changes. Spleen: Normal in size without focal abnormality. Adrenals/Urinary Tract: Adrenal glands are unremarkable.  Kidneys are normal, without renal calculi, focal lesion, or hydronephrosis. Bladder is unremarkable. Stomach/Bowel: Stomach is within normal limits. Appendix is surgically absent by report. No evidence of bowel wall thickening, distention, or inflammatory changes. Vascular/Lymphatic: Bandlike mesenteric soft tissue attenuation with calcification in the mid abdomen is slightly more prominent than on prior consistent with treated lymphoma. Small scattered mesenteric lymph nodes are seen in the right lower quadrant and root of the mesentery as before. No inguinal or pelvic sidewall adenopathy. No retroperitoneal or mesenteric  lymphadenopathy. No porta hepatic nor epigastric enlargement of lymph nodes Reproductive: Prostate is unremarkable. Other: No abdominal wall hernia or abnormality. No abdominopelvic ascites. Musculoskeletal: No acute or significant osseous findings. Degenerative disc disease L4-5. IMPRESSION: 1. Chronic since 2013 are stable pulmonary nodules noted bilaterally, index nodule the left upper lobe measures 7 mm consistent with benign findings. 2. Hazy mesenteric fat in the mid abdomen with calcification consistent with treated non-Hodgkin's lymphoma. No recurrence of lymphadenopathy identified within chest, abdomen or pelvis. 3. Cholecystectomy. Electronically Signed   By: Ashley Royalty M.D.   On: 07/01/2016 20:12   Mr Brain Wo Contrast  Result Date: 07/01/2016 CLINICAL DATA:  Acute presentation with slurred speech and right-sided facial droop. Right upper and lower extremity weakness. EXAM: MRI HEAD WITHOUT CONTRAST TECHNIQUE: Multiplanar, multiecho pulse sequences of the brain and surrounding structures were obtained without intravenous contrast. COMPARISON:  Multiple CT studies done yesterday.  MRI 06/16/2016. FINDINGS: Brain: There is acute infarction affecting the left para median pons. No acute infarction affecting the cerebral hemispheres. No focal cerebellar insult. Few old small vessel  infarctions within the thalami I. Moderate chronic small-vessel ischemic changes of the cerebral hemispheric white matter. No cortical or large vessel territory infarction. No mass lesion, hemorrhage, hydrocephalus or extra-axial collection. Vascular: Major vessels at the base of the brain show flow. Skull and upper cervical spine: Negative Sinuses/Orbits: Clear except for mild maxillary sinus mucosal thickening and retention cyst. Orbits negative. Other: None significant IMPRESSION: Acute infarction affecting the left para median pons. No hemorrhage. Chronic small-vessel ischemic changes elsewhere as outlined above. Electronically Signed   By: Nelson Chimes M.D.   On: 07/01/2016 07:11   Ct Abdomen Pelvis W Contrast  Result Date: 07/01/2016 CLINICAL DATA:  Bladder cancer, non-Hodgkin's lymphoma and Barrett's esophagus. EXAM: CT CHEST, ABDOMEN, AND PELVIS WITH CONTRAST TECHNIQUE: Multidetector CT imaging of the chest, abdomen and pelvis was performed following the standard protocol during bolus administration of intravenous contrast. CONTRAST:  118mL ISOVUE-300 IOPAMIDOL (ISOVUE-300) INJECTION 61% COMPARISON:  CXR from 06/30/2016, chest CT from 09/09/2011, CT chest, abdomen and pelvis from 09/09/2011 FINDINGS: CT CHEST FINDINGS Cardiovascular: Undo top normal size cardiac chambers without pericardial effusion. No large central pulmonary embolus. Aortic atherosclerosis without aneurysm. No dissection. Mild ectasia of the ascending aorta as before up to 3.9 cm. Normal branch pattern for the great vessels. Mediastinum/Nodes: Stable subcentimeter left-sided thyroid nodules since 2013 consistent with benign findings. The largest currently is approximately 12 mm. Fat containing right lower paratracheal lymph node measuring up to 13 mm is without significant change. Stable hilar lymph nodes, the largest on the right approximately 9 mm short axis and on the left, 8 mm short axis. Lungs/Pleura: Stable calcified and  noncalcified subcentimeter pulmonary nodules dating back to 2013 consistent with benign findings, index nodule the left upper lobe measuring 7 mm, series 5, image 76. Bibasilar dependent atelectasis. There is lingular, right middle lobe and both lower lobe bronchiectasis with mild peribronchial thickening and chronic minimal parenchymal scarring and/or atelectasis. Musculoskeletal: Lower thoracic kyphosis attributable to multilevel degenerative disc disease. Small osteophytes are noted along the anterior aspect of the mid and lower thoracic spine. No acute nor suspicious osseous abnormalities. CT ABDOMEN PELVIS FINDINGS Hepatobiliary: Status post cholecystectomy. No space-occupying mass of the liver. No biliary dilatation. Pancreas: Unremarkable. No pancreatic ductal dilatation or surrounding inflammatory changes. Spleen: Normal in size without focal abnormality. Adrenals/Urinary Tract: Adrenal glands are unremarkable. Kidneys are normal, without renal calculi, focal lesion, or hydronephrosis. Bladder is unremarkable.  Stomach/Bowel: Stomach is within normal limits. Appendix is surgically absent by report. No evidence of bowel wall thickening, distention, or inflammatory changes. Vascular/Lymphatic: Bandlike mesenteric soft tissue attenuation with calcification in the mid abdomen is slightly more prominent than on prior consistent with treated lymphoma. Small scattered mesenteric lymph nodes are seen in the right lower quadrant and root of the mesentery as before. No inguinal or pelvic sidewall adenopathy. No retroperitoneal or mesenteric lymphadenopathy. No porta hepatic nor epigastric enlargement of lymph nodes Reproductive: Prostate is unremarkable. Other: No abdominal wall hernia or abnormality. No abdominopelvic ascites. Musculoskeletal: No acute or significant osseous findings. Degenerative disc disease L4-5. IMPRESSION: 1. Chronic since 2013 are stable pulmonary nodules noted bilaterally, index nodule the left  upper lobe measures 7 mm consistent with benign findings. 2. Hazy mesenteric fat in the mid abdomen with calcification consistent with treated non-Hodgkin's lymphoma. No recurrence of lymphadenopathy identified within chest, abdomen or pelvis. 3. Cholecystectomy. Electronically Signed   By: Ashley Royalty M.D.   On: 07/01/2016 20:12   Ct Cerebral Perfusion W Contrast  Result Date: 06/30/2016 CLINICAL DATA:  Dysarthria.  Right-sided facial droop. EXAM: CT PERFUSION BRAIN TECHNIQUE: Multiphase CT imaging of the brain was performed following IV bolus contrast injection. Subsequent parametric perfusion maps were calculated using RAPID software. CONTRAST:  40 mL Isovue 370 COMPARISON:  None. FINDINGS: CT Brain Perfusion Findings: CBF (<30%) Volume: 88mL Perfusion (Tmax>6.0s) volume: 40mL Mismatch Volume: 56mL Infarction Location:None No significant patient motion is present. Arterial input and venous output functions are excellent. IMPRESSION: Negative CT perfusion exam of the head. No evidence for acute infarct or ischemia. Electronically Signed   By: San Morelle M.D.   On: 06/30/2016 16:54   Dg Swallowing Func-speech Pathology  Result Date: 07/01/2016 Objective Swallowing Evaluation: Type of Study: MBS-Modified Barium Swallow Study Patient Details Name: AXL RODINO MRN: [id number] Date of Birth: [birth year] Today's Date: 07/01/2016 Time: SLP Start Time (ACUTE ONLY): 0910-SLP Stop Time (ACUTE ONLY): 0933 SLP Time Calculation (min) (ACUTE ONLY): 23 min Past Medical History: Past Medical History: Diagnosis Date . Arthritis  . Barrett esophagus  . Bladder cancer ([place]) dx'd 9528,4132  surg only . Colon polyps   adenomatous . Difficulty sleeping   OCCASIONALLY . Diverticulosis of colon (without mention of hemorrhage)  . Frequency of urination  . GERD (gastroesophageal reflux disease)  . Hiatal hernia  . History of bladder cancer 1981  EXCISION ONLY . History of skin cancer   BASAL CELL . Hyperlipidemia  .  Lymphoma, non-Hodgkin's ([place]) 2011  [place]  . Macular degeneration   LEFT EYE . Multiple nodules of lung 09/16/2011 . PAF (paroxysmal atrial fibrillation) ([place])  . Personal history of colonic polyps 07/15/2010  tubular adenoma . Prostate cancer [facility])   patient denies . PUD (peptic ulcer disease)  . Stomach cancer ([place])   NHL origin Past Surgical History: Past Surgical History: Procedure Laterality Date . APPENDECTOMY   . bone spur    left shoulder . CHOLECYSTECTOMY   . KNEE ARTHROPLASTY Left 07/30/2015  Procedure: LEFT TOTAL KNEE ARTHROPLASTY WITH COMPUTER NAVIGATION;  Surgeon: Rod Can, MD;  Location: [facility];  Service: Orthopedics;  Laterality: Left; . LASER OF PROSTATE W/ GREEN LIGHT PVP   . TENDON REPAIR    right arm . TRIGGER FINGER RELEASE    bilateral hands . WRIST SURGERY    Bilateral HPI: Ptis an 83 y.o. malewith PMH significant of PAF, Barrett's esophagus, bladder cancer, non-Hodgkin's lymphoma, OA, andHLD; who presents with acute  onset of slurred speech, right-sided facial droop, and right sided upper and lower extremity weakness. He had just been recently hospitalized from 3/14 -3/19 for community-acquired pneumonia. Due to swallowing difficulties patient was evaluated by speech therapy and seen to be silently aspirating. Patient was placed on modified diet (recommended Dys 3 solids, thin liquids). Upon discharge he was sent to a skilled nursing facilityfor rehabilitation. 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 No hemorrhage. Chronic small-vessel ischemic changes elsewhere. CXR showed some improvement in the appearance of the lungs especially upper lobes but coarse interstitial markings persist in the lower lobes. There is no discrete pneumonia. There is stablecardiomegaly without pulmonary vascular congestion.Thoracic aortic atherosclerosis. No Data Recorded Assessment / Plan / Recommendation CHL IP CLINICAL IMPRESSIONS 07/01/2016 Clinical Impression  Pt has had a decline in swallow function this admission following acute pontine CVA. Impairments primarily motor based with decreased laryngeal elevation, laryngeal closure and reduced tongue base retraction. Silent penetration and aspiration during initial and multiple swallows with nectar and honey thick consistencies. Chin tuck was ineffective to mitigate laryngeal invasion or to decrease vallecular residue during swallow. Pt required multiple verbal prompts for cough/throat clear which were weak and unable to clear vestibule. Although puree was not penetrated or aspirated, volume was significant and remained increasing risk. MBS does not diagnose below the level of the UES, however esophageal scan revealed stasis in distal esophagus. Recommend NPO status, oral care, short term alternative nutrition and continue ST intervention. From ST/swallow standpoint, pt is an excellent candidate for inpatient rehab.   SLP Visit Diagnosis Dysphagia, oropharyngeal phase (R13.12) Attention and concentration deficit following -- Frontal lobe and executive function deficit following -- Impact on safety and function --   CHL IP TREATMENT RECOMMENDATION 07/01/2016 Treatment Recommendations Therapy as outlined in treatment plan below   Prognosis 07/01/2016 Prognosis for Safe Diet Advancement Good Barriers to Reach Goals -- Barriers/Prognosis Comment -- CHL IP DIET RECOMMENDATION 07/01/2016 SLP Diet Recommendations NPO Liquid Administration via -- Medication Administration Via alternative means Compensations -- Postural Changes --   CHL IP OTHER RECOMMENDATIONS 07/01/2016 Recommended Consults -- Oral Care Recommendations Oral care QID Other Recommendations --   CHL IP FOLLOW UP RECOMMENDATIONS 07/01/2016 Follow up Recommendations Inpatient Rehab   CHL IP FREQUENCY AND DURATION 07/01/2016 Speech Therapy Frequency (ACUTE ONLY) min 2x/week Treatment Duration 2 weeks      CHL IP ORAL PHASE 07/01/2016 Oral Phase Impaired Oral - Pudding Teaspoon  -- Oral - Pudding Cup -- Oral - Honey Teaspoon -- Oral - Honey Cup Lingual/palatal residue Oral - Nectar Teaspoon -- Oral - Nectar Cup Lingual/palatal residue Oral - Nectar Straw -- Oral - Thin Teaspoon -- Oral - Thin Cup Lingual/palatal residue Oral - Thin Straw -- Oral - Puree Lingual/palatal residue Oral - Mech Soft NT Oral - Regular -- Oral - Multi-Consistency -- Oral - Pill -- Oral Phase - Comment --  CHL IP PHARYNGEAL PHASE 07/01/2016 Pharyngeal Phase Impaired Pharyngeal- Pudding Teaspoon -- Pharyngeal -- Pharyngeal- Pudding Cup -- Pharyngeal -- Pharyngeal- Honey Teaspoon -- Pharyngeal -- Pharyngeal- Honey Cup Pharyngeal residue - valleculae;Reduced tongue base retraction;Penetration/Aspiration during swallow;Reduced laryngeal elevation;Reduced airway/laryngeal closure Pharyngeal Material enters airway, passes BELOW cords without attempt by patient to eject out (silent aspiration);Material enters airway, CONTACTS cords and not ejected out Pharyngeal- Nectar Teaspoon -- Pharyngeal -- Pharyngeal- Nectar Cup Delayed swallow initiation-vallecula;Pharyngeal residue - valleculae;Reduced tongue base retraction;Reduced laryngeal elevation;Penetration/Aspiration during swallow;Reduced airway/laryngeal closure;Pharyngeal residue - pyriform Pharyngeal Material enters airway, passes BELOW  cords without attempt by patient to eject out (silent aspiration);Material enters airway, CONTACTS cords and not ejected out Pharyngeal- Nectar Straw -- Pharyngeal -- Pharyngeal- Thin Teaspoon -- Pharyngeal -- Pharyngeal- Thin Cup NT Pharyngeal -- Pharyngeal- Thin Straw -- Pharyngeal -- Pharyngeal- Puree Pharyngeal residue - valleculae;Reduced tongue base retraction Pharyngeal -- Pharyngeal- Mechanical Soft NT Pharyngeal -- Pharyngeal- Regular -- Pharyngeal -- Pharyngeal- Multi-consistency -- Pharyngeal -- Pharyngeal- Pill -- Pharyngeal -- Pharyngeal Comment --  CHL IP CERVICAL ESOPHAGEAL PHASE 07/01/2016 Cervical Esophageal Phase WFL  Pudding Teaspoon -- Pudding Cup -- Honey Teaspoon -- Honey Cup -- Nectar Teaspoon -- Nectar Cup -- Nectar Straw -- Thin Teaspoon -- Thin Cup -- Thin Straw -- Puree -- Mechanical Soft -- Regular -- Multi-consistency -- Pill -- Cervical Esophageal Comment -- CHL IP GO 06/16/2016 Functional Assessment Tool Used skilled clinical judgement Functional Limitations Swallowing Swallow Current Status (M6294) CK Swallow Goal Status (T6546) CJ Swallow Discharge Status (T0354) (None) Motor Speech Current Status (S5681) (None) Motor Speech Goal Status (E7517) (None) Motor Speech Goal Status (G0174) (None) Spoken Language Comprehension Current Status (B4496) (None) Spoken Language Comprehension Goal Status (P5916) (None) Spoken Language Comprehension Discharge Status (B8466) (None) Spoken Language Expression Current Status (Z9935) (None) Spoken Language Expression Goal Status (T0177) (None) Spoken Language Expression Discharge Status [phone] (None) Attention Current Status (E0923) (None) Attention Goal Status (R0076) (None) Attention Discharge Status (A2633) (None) Memory Current Status (H5456) (None) Memory Goal Status (Y5638) (None) Memory Discharge Status (L3734) (None) Voice Current Status (K8768) (None) Voice Goal Status (T1572) (None) Voice Discharge Status (I2035) (None) Other Speech-Language Pathology Functional Limitation Current Status (D9741) (None) Other Speech-Language Pathology Functional Limitation Goal Status (U3845) (None) Other Speech-Language Pathology Functional Limitation Discharge Status [phone]) (None) [name] 07/01/2016, 11:26 AM [name] [name].Ed CCC-SLP Pager [phone]              Ct Head Code Stroke W/o Cm  Addendum Date: 06/30/2016   ADDENDUM REPORT: 06/30/2016 16:06 ADDENDUM: Study discussed by telephone on 06/30/2016 with Dr. Harrell Gave Tegeler, at 1534 hours. Electronically Signed   By: Genevie Ann M.D.   On: 06/30/2016 16:06   Result Date: 06/30/2016 CLINICAL DATA:  Code stroke.  83 year old male with episode of right leg weakness slurred speech and right facial droop which lasted about 1 hour today. Facial droop persists but other symptoms have resolved. Initial encounter. EXAM: CT HEAD WITHOUT CONTRAST TECHNIQUE: Contiguous axial images were obtained from the base of the skull through the vertex without intravenous contrast. COMPARISON:  Brain MRI 06/16/2016 and earlier. FINDINGS: Brain: Stable cerebral volume. No midline shift, mass effect, or evidence of intracranial mass lesion. No ventriculomegaly. No acute intracranial hemorrhage identified. Stable gray-white matter differentiation throughout the brain. Chronic white matter hypodensity appears stable. No cortically based acute infarct identified. Vascular: Calcified atherosclerosis at the skull base. No suspicious intracranial vascular hyperdensity. Skull: No acute osseous abnormality identified. Sinuses/Orbits: Visualized paranasal sinuses and mastoids are stable and well pneumatized. Other: No acute orbit or scalp soft tissue findings. ASPECTS ([place] Stroke Program Early CT Score) - Ganglionic level infarction (caudate, lentiform nuclei, internal capsule, insula, M1-M3 cortex): 7 - Supraganglionic infarction (M4-M6 cortex): 3 Total score (0-10 with 10 being normal): 10 IMPRESSION: 1. Stable non contrast CT appearance of the brain, no acute intracranial abnormality. 2. ASPECTS is 10. 3. Chronic white matter changes. Electronically Signed: By: Genevie Ann M.D. On: 06/30/2016 15:24      Lab Results  Component Value Date   HGBA1C 5.5 07/01/2016  Lab Results  Component Value Date   LDLCALC 68 07/01/2016   CREATININE 1.10 06/30/2016       Scheduled Meds: . enoxaparin (LOVENOX) injection  1 mg/kg Subcutaneous Q12H  . guaiFENesin  600 mg Oral BID  . pantoprazole  40 mg Oral BID  . simvastatin  40 mg Oral QHS  . tamsulosin  0.4 mg Oral QHS   Continuous Infusions: . sodium chloride 75 mL/hr at 07/02/16 0505     LOS:  1 day    Time spent: >30 MINS    [facility]  [facility] Pager [phone]. If 7PM-7AM, please contact night-coverage at [email or web address], password [facility] 07/02/2016, 10:19 AM  LOS: 1 day

## 2016-07-02 NOTE — Progress Notes (Signed)
STROKE TEAM PROGRESS NOTE   HISTORY OF PRESENT ILLNESS (per record) Jason [name] is an 83 y.o. male who was seen one weak ago for swallowing difficulties and generalized weakness. At that time " I spoke to the daughter in depth. Apparently he has been having difficulty with swallowing and choking on his food. He has been seen by his PCP and they thought it might be his records esophagus. Patient is also been noticing to drool on the left side of his mouth more than usual. They attributed that also to his Bell's palsy. It was noted that his his mother did have Parkinson's however naming the symptoms of Parkinson's patient's daughter did not note any significant symptoms that jumped out to her. She has noticed that while he eats he often tires out while chewing, in addition he also will have a hard time holding his head up for prolonged periods of time." Speech had seen patient at that time and noted "Oral function chaacteraized by mild lingual weakness observed via mild lingual residuals, with functional abiltiy to masticate and transit soft solids (though endurance not taxed). Flash penetration and silent aspriation during the swallow occured with boluses larger than 55ml (estimate) due to slow laryngeal closure and epiglottic deflection. Pt was able trigger a timely swallow and eventually achieve full laryngeal closure which expelled penetrate when possible, but again sluggish movement kept vestible partialy open during bolus passage. Further silent aspiration events also occured after the swallow if pt was cued to cough, which spilled valleuular residual into the airway. Attempted chin tuck without improvement and mendelsohn with visual biofeedback (pt unable after 5 attempts with max cues). Best methods included verbal cues for small sips (bolus size consistently comparable in appearance and fucntion with 73mL teaspoon), oral hold and effortful swallow x2. "  Today he was noted to have a sudden right facial  droop, worsening of dysarthria and right leg plegia.   Neurology was called and patient was brought for a emergent CT perfusion to evaluate for M1 stenosis or occlusion.   Of note he had a Myasthenia panel obtained on previous hospitalization which was negative.   He was last known well 06/30/2016 at 11:30a. Patient was not administered IV t-PA secondary to out of window. He was admitted for further evaluation and treatment.   SUBJECTIVE (INTERVAL HISTORY) His daughter is at bedside. T  Apparently, pt had swallow problems since 6 months ago, chocking and difficulty swallow.   Last admission, continue to have swallow difficulty but MRI negative. This time right facial droop, right UE and LE weakness yesterday but much improved now    OBJECTIVE Temp:  [97.6 F (36.4 C)-98.9 F (37.2 C)] 97.8 F (36.6 C) (03/31 1404) Pulse Rate:  [64-82] 64 (03/31 1404) Cardiac Rhythm: Bundle branch block;Heart block (03/31 0700) Resp:  [17-20] 18 (03/31 1404) BP: (141-158)/(53-77) 144/64 (03/31 1404) SpO2:  [97 %-100 %] 100 % (03/31 1404)  CBC:   Recent Labs Lab 06/30/16 1524 06/30/16 1530 07/02/16 0403  WBC 8.2  --  6.2  NEUTROABS 5.8  --   --   HGB 12.1* 12.9* 11.9*  HCT 38.3* 38.0* 37.4*  MCV 85.5  --  84.6  PLT 194  --  188    Basic Metabolic Panel:   Recent Labs Lab 06/30/16 1524 06/30/16 1530  NA 140 142  K 3.9 3.8  CL 106 105  CO2 25  --   GLUCOSE 103* 98  BUN 28* 30*  CREATININE 1.21 1.10  CALCIUM 9.1  --     Lipid Panel:     Component Value Date/Time   CHOL 123 07/01/2016 0746   TRIG 52 07/01/2016 0746   HDL 45 07/01/2016 0746   CHOLHDL 2.7 07/01/2016 0746   VLDL 10 07/01/2016 0746   LDLCALC 68 07/01/2016 0746   HgbA1c:  Lab Results  Component Value Date   HGBA1C 5.5 07/01/2016   Urine Drug Screen:     Component Value Date/Time   LABOPIA NONE DETECTED 06/15/2016 0856   COCAINSCRNUR NONE DETECTED 06/15/2016 0856   LABBENZ NONE DETECTED 06/15/2016 0856    AMPHETMU NONE DETECTED 06/15/2016 0856   THCU NONE DETECTED 06/15/2016 0856   LABBARB NONE DETECTED 06/15/2016 0856      IMAGING I have personally reviewed the radiological images below and agree with the radiology interpretations.  Ct Head Code Stroke W/o Cm 06/30/2016  1. Stable non contrast CT appearance of the brain, no acute intracranial abnormality. 2. ASPECTS is 10. 3. Chronic white matter changes.   Ct Angio Head W Or Wo Contrast Ct Angio Neck W Or Wo Contrast 06/30/2016 1. No emerging large vessel occlusion. 2. Atherosclerotic changes at the aortic arch, carotid bifurcations, dural margin of vertebral arteries, and cavernous internal carotid arteries without significant stenoses. 3. Mild diffuse segmental branch vessel irregularity within both the anterior and posterior circulation suggesting intracranial atherosclerotic change. There is no significant proximal stenosis or occlusion. 4. Multilevel spondylosis of the cervical spine.   Ct Cerebral Perfusion W Contrast 06/30/2016 Negative CT perfusion exam of the head. No evidence for acute infarct or ischemia.   Mr Brain Wo Contrast 07/01/2016 Acute infarction affecting the left para median pons. No hemorrhage. Chronic small-vessel ischemic changes elsewhere as outlined above.   Dg Chest 2 View 06/30/2016 There has been some improvement in the appearance of the lungs especially upper lobes but coarse interstitial markings persist in the lower lobes. There is no discrete pneumonia. There is stable cardiomegaly without pulmonary vascular congestion. Thoracic aortic atherosclerosis.   TTE 06/17/16 - Technically difficult study with poor acoustic windows. Normal LV   size with severe LV hypertrophy. EF 55-60. Mildly dilated RV with   normal systolic function. No significant valvular abnormalities.   PHYSICAL EXAM  Temp:  [97.6 F (36.4 C)-98.9 F (37.2 C)] 97.8 F (36.6 C) (03/31 1404) Pulse Rate:  [64-82] 64 (03/31  1404) Resp:  [17-20] 18 (03/31 1404) BP: (141-158)/(53-77) 144/64 (03/31 1404) SpO2:  [97 %-100 %] 100 % (03/31 1404)  General - Well nourished, well developed elderly Caucasian male, in no apparent distress.  Ophthalmologic - Fundi not visualized due to noncooperation.  Cardiovascular - Regular rate and rhythm, not in afib.  Mental Status -  Level of arousal and orientation to time, place, and person were intact. Language including expression, naming, repetition, comprehension was assessed and found intact,  mild dysarthria. Fund of Knowledge was assessed and was intact.  Cranial Nerves II - XII - II - Visual field intact OU. III, IV, VI - Extraocular movements intact. V - Facial sensation intact bilaterally. VII - right nasolabial fold flattening. VIII - Hearing & vestibular intact bilaterally. X - Palate elevates symmetrically, mild dysarthria. XI - Chin turning & shoulder shrug intact bilaterally. XII - Tongue protrusion intact.  Motor Strength - The patient's strength was normal in all extremities and pronator drift was absent.  Bulk was normal and fasciculations were absent.   Motor Tone - Muscle tone was assessed at the neck and appendages  and was normal.  Reflexes - The patient's reflexes were 1+ in all extremities and he had no pathological reflexes.  Sensory - Light touch, temperature/pinprick were assessed and were symmetrical.    Coordination - The patient had normal movements in the hands with no ataxia or dysmetria.  Tremor was absent.  Gait and Station - deferred ASSESSMENT/PLAN Mr. KOREE SCHOPF is a 83 y.o. male with history of PAF on Eliquis, Barrett's esophagus, bladder cancer, non-Hodgkin's lymphoma, OA, and HLD presenting with facial droop and right leg weakness. He did not receive IV t-PA due to being outside the window.   Stroke:   L paramedian pontine infarct secondary to small vessel disease source. However, pt has pAfib on eliquis (compliance). Pt  also has multiple cancer history, will check pan CT to rule out advanced malignancy.   Resultant  Dysarthria, and swallowing difficulty  Code Stroke CT no acute stroke. small vessel disease. Aspects 10.   CTA head/neck no ELVO. Atherosclerosis. Multilevel spondylosis  CT Perfusion negative. No acute infarct  MRI  acute L paramedian pontine infarct. small vessel disease.   LE venous doppler 06/15/16 NO DVT. R&L enlarged lymph nodes  2D Echo  06/15/16 severe LVH. EF 55-60%. No valve abnormalities  Pan CT  No evidence of advanced malignancy  LDL 68   HgbA1c pending  UDS negative  lovenox therapeutic dose for VTE prophylaxis Diet NPO time specified  Eliquis (apixaban) daily prior to admission, now on lovenox therapeutic dose due to NPO status. Once passed swallow or have po access, can switch back to eliquis.  Patient counseled to be compliant with his antithrombotic medications  Ongoing aggressive stroke risk factor management  Therapy recommendations:  pending   Disposition:  pending  (from [facility])  Physical deconditioning, drooling and swallowing difficulty - ? Neuromuscular disorder vs. ? Hx of stroke  Admitted 2 weeks ago and concerning for neuromuscular disease  MG panel negative and mestinon trial negative  Recommended outpt EMG/NCS  Has appointment with Dr. [name] at [facility] on 07/07/16  Pt had left drooling intermittently for the last 2 years  Pt has swallow difficulty for 6 months  CT and MRI showed remote right IC genu infarct and right thalamic punctate infarcts  Wondering whether pt symptoms can be explained by remote infarcts.  Paroxysmal Atrial Fibrillation  Home anticoagulation:  Eliquis (apixaban) daily continued in the hospital  NPO status, failed MBS  On therapeutic lovenox now   Switch back to eliquis 5mg  bid once passed swallow or po access.  History of multiple cancers  Bladder cancer s/p Sx  Prostate cancer s/p Sx  Stomach  lymphoma NHL s/p chemo  No recurrence as pt and family aware  Pan CT no evidence of  malignancy  Hypertension  Stable Permissive hypertension (OK if < 180/105) but gradually normalize in 5-7 days Long-term BP goal normotensive  Hyperlipidemia  Home meds:  zocor 40,resumed in hospital  LDL 68, goal < 70  Statin on hold due to NPO status, resume once pass swallow or have po access  Continue statin at discharge  Other Stroke Risk Factors  Advanced age  Obesity, Body mass index is 31.75 kg/m., recommend weight loss, diet and exercise as appropriate   Other Active Problems  Recent community acquired PNA  BPH on flomax  Abnormal UA WBC 6-30  Normocytic normochronic anemia  GERD w/ H/O Barrett's esophagus  Pt has follow up appointment with Dr. [name] at [facility] on 07/07/16  Hospital day # 1  Antony Contras, MD Stroke Neurology 07/02/2016 4:11 PM  I have personally examined this patient, reviewed notes, independently viewed imaging studies, participated in medical decision making and plan of care.ROS completed by me personally and pertinent positives fully documented  I have made any additions or clarifications directly to the above note. The patient presented with subarachnoid dysphagia and MRI films pontine infarct which is a possible cause. he does have some pseudobulbar features on exam as well. agree with temporary tube feeds but if he does not improve may need to consider peg tube. i had a long discussion the patient and family at the bedside and answered questions. greater than 50% time during this 25 minute visit was spent on counseling and coordination of care about his dysphagia, stroke, treatment and evaluation plan discussion and answering questions. discussed with dr. Hillard Danker, MD Medical Director [place] Pager: [phone] 07/02/2016 4:17 PM   To contact Stroke Continuity provider, please refer to [email or web address] After hours, contact General  Neurology

## 2016-07-03 LAB — BASIC METABOLIC PANEL
Anion gap: 8 (ref 5–15)
BUN: 26 mg/dL — AB (ref 6–20)
CALCIUM: 8.5 mg/dL — AB (ref 8.9–10.3)
CO2: 21 mmol/L — ABNORMAL LOW (ref 22–32)
CREATININE: 1.06 mg/dL (ref 0.61–1.24)
Chloride: 109 mmol/L (ref 101–111)
GFR calc non Af Amer: >60 mL/min (ref >60)
Glucose, Bld: 129 mg/dL — ABNORMAL HIGH (ref 65–99)
Potassium: 3.3 mmol/L — ABNORMAL LOW (ref 3.5–5.1)
SODIUM: 138 mmol/L (ref 135–145)

## 2016-07-03 LAB — GLUCOSE, CAPILLARY
GLUCOSE-CAPILLARY: 125 mg/dL — AB (ref 65–99)
Glucose-Capillary: 129 mg/dL — ABNORMAL HIGH (ref 65–99)
Glucose-Capillary: 133 mg/dL — ABNORMAL HIGH (ref 65–99)
Glucose-Capillary: 132 mg/dL — ABNORMAL HIGH (ref 65–99)

## 2016-07-03 NOTE — Progress Notes (Signed)
Patient ID: Jason [name], male   DOB: [birth year], 83 y.o.   MRN: [id number]   Request for percutaneous gastric tube received.  Talked with Neuro PA D Rinehuls Confirmed with Dr Leonie Man to [facility] for now Plan is to recheck swallowing in few days----they will let IR know of needs.

## 2016-07-03 NOTE — Progress Notes (Signed)
STROKE TEAM PROGRESS NOTE   HISTORY OF PRESENT ILLNESS (per record) Jason [name] is an 83 y.o. male who was seen one weak ago for swallowing difficulties and generalized weakness. At that time " I spoke to the daughter in depth. Apparently he has been having difficulty with swallowing and choking on his food. He has been seen by his PCP and they thought it might be his records esophagus. Patient is also been noticing to drool on the left side of his mouth more than usual. They attributed that also to his Bell's palsy. It was noted that his his mother did have Parkinson's however naming the symptoms of Parkinson's patient's daughter did not note any significant symptoms that jumped out to her. She has noticed that while he eats he often tires out while chewing, in addition he also will have a hard time holding his head up for prolonged periods of time." Speech had seen patient at that time and noted "Oral function chaacteraized by mild lingual weakness observed via mild lingual residuals, with functional abiltiy to masticate and transit soft solids (though endurance not taxed). Flash penetration and silent aspriation during the swallow occured with boluses larger than 14ml (estimate) due to slow laryngeal closure and epiglottic deflection. Pt was able trigger a timely swallow and eventually achieve full laryngeal closure which expelled penetrate when possible, but again sluggish movement kept vestible partialy open during bolus passage. Further silent aspiration events also occured after the swallow if pt was cued to cough, which spilled valleuular residual into the airway. Attempted chin tuck without improvement and mendelsohn with visual biofeedback (pt unable after 5 attempts with max cues). Best methods included verbal cues for small sips (bolus size consistently comparable in appearance and fucntion with 70mL teaspoon), oral hold and effortful swallow x2. "  Today he was noted to have a sudden right facial  droop, worsening of dysarthria and right leg plegia.   Neurology was called and patient was brought for a emergent CT perfusion to evaluate for M1 stenosis or occlusion.   Of note he had a Myasthenia panel obtained on previous hospitalization which was negative.   He was last known well 06/30/2016 at 11:30a. Patient was not administered IV t-PA secondary to out of window. He was admitted for further evaluation and treatment.   SUBJECTIVE (INTERVAL HISTORY) His daughter is at bedside. He had panda tube passed yesterday and is getting tube feeding. No new complaints. OBJECTIVE Temp:  [97.6 F (36.4 C)-98.7 F (37.1 C)] 98.3 F (36.8 C) (04/01 0556) Pulse Rate:  [59-72] 59 (04/01 0556) Cardiac Rhythm: Normal sinus rhythm;Bundle branch block (04/01 0837) Resp:  [18-20] 18 (04/01 0556) BP: (141-160)/(55-70) 152/58 (04/01 0556) SpO2:  [95 %-100 %] 99 % (04/01 0556)  CBC:   Recent Labs Lab 06/30/16 1524 06/30/16 1530 07/02/16 0403  WBC 8.2  --  6.2  NEUTROABS 5.8  --   --   HGB 12.1* 12.9* 11.9*  HCT 38.3* 38.0* 37.4*  MCV 85.5  --  84.6  PLT 194  --  867    Basic Metabolic Panel:   Recent Labs Lab 06/30/16 1524 06/30/16 1530 07/03/16 0346  NA 140 142 138  K 3.9 3.8 3.3*  CL 106 105 109  CO2 25  --  21*  GLUCOSE 103* 98 129*  BUN 28* 30* 26*  CREATININE 1.21 1.10 1.06  CALCIUM 9.1  --  8.5*    Lipid Panel:     Component Value Date/Time   CHOL  123 07/01/2016 0746   TRIG 52 07/01/2016 0746   HDL 45 07/01/2016 0746   CHOLHDL 2.7 07/01/2016 0746   VLDL 10 07/01/2016 0746   LDLCALC 68 07/01/2016 0746   HgbA1c:  Lab Results  Component Value Date   HGBA1C 5.5 07/01/2016   Urine Drug Screen:     Component Value Date/Time   LABOPIA NONE DETECTED 06/15/2016 0856   COCAINSCRNUR NONE DETECTED 06/15/2016 0856   LABBENZ NONE DETECTED 06/15/2016 0856   AMPHETMU NONE DETECTED 06/15/2016 0856   THCU NONE DETECTED 06/15/2016 0856   LABBARB NONE DETECTED  06/15/2016 0856      IMAGING I have personally reviewed the radiological images below and agree with the radiology interpretations.  Ct Head Code Stroke W/o Cm 06/30/2016  1. Stable non contrast CT appearance of the brain, no acute intracranial abnormality. 2. ASPECTS is 10. 3. Chronic white matter changes.   Ct Angio Head W Or Wo Contrast Ct Angio Neck W Or Wo Contrast 06/30/2016 1. No emerging large vessel occlusion. 2. Atherosclerotic changes at the aortic arch, carotid bifurcations, dural margin of vertebral arteries, and cavernous internal carotid arteries without significant stenoses. 3. Mild diffuse segmental branch vessel irregularity within both the anterior and posterior circulation suggesting intracranial atherosclerotic change. There is no significant proximal stenosis or occlusion. 4. Multilevel spondylosis of the cervical spine.   Ct Cerebral Perfusion W Contrast 06/30/2016 Negative CT perfusion exam of the head. No evidence for acute infarct or ischemia.   Mr Brain Wo Contrast 07/01/2016 Acute infarction affecting the left para median pons. No hemorrhage. Chronic small-vessel ischemic changes elsewhere as outlined above.   Dg Chest 2 View 06/30/2016 There has been some improvement in the appearance of the lungs especially upper lobes but coarse interstitial markings persist in the lower lobes. There is no discrete pneumonia. There is stable cardiomegaly without pulmonary vascular congestion. Thoracic aortic atherosclerosis.   TTE 06/17/16 - Technically difficult study with poor acoustic windows. Normal LV   size with severe LV hypertrophy. EF 55-60. Mildly dilated RV with   normal systolic function. No significant valvular abnormalities.   PHYSICAL EXAM  Temp:  [97.6 F (36.4 C)-98.7 F (37.1 C)] 98.3 F (36.8 C) (04/01 0556) Pulse Rate:  [59-72] 59 (04/01 0556) Resp:  [18-20] 18 (04/01 0556) BP: (141-160)/(55-70) 152/58 (04/01 0556) SpO2:  [95 %-100 %] 99 % (04/01  0556)  General - Well nourished, well developed elderly Caucasian male, in no apparent distress.  Ophthalmologic - Fundi not visualized due to noncooperation.  Cardiovascular - Regular rate and rhythm, not in afib.  Mental Status -  Level of arousal and orientation to time, place, and person were intact. Language including expression, naming, repetition, comprehension was assessed and found intact,  mild dysarthria. Fund of Knowledge was assessed and was intact.  Cranial Nerves II - XII - II - Visual field intact OU. III, IV, VI - Extraocular movements intact. V - Facial sensation intact bilaterally. VII - right nasolabial fold flattening. VIII - Hearing & vestibular intact bilaterally. X - Palate elevates symmetrically, mild dysarthria. XI - Chin turning & shoulder shrug intact bilaterally. XII - Tongue protrusion intact.  Motor Strength - The patient's strength was normal in all extremities and pronator drift was absent.  Bulk was normal and fasciculations were absent.    Reflexes - The patient's reflexes were 1+ in all extremities and he had no pathological reflexes.  Sensory - Light touch, temperature/pinprick were assessed and were symmetrical.    Coordination -  The patient had normal movements in the hands with no ataxia or dysmetria.  Tremor was absent.  Gait and Station - deferred ASSESSMENT/PLAN Jason [name] is a 83 y.o. male with history of PAF on Eliquis, Barrett's esophagus, bladder cancer, non-Hodgkin's lymphoma, OA, and HLD presenting with facial droop and right leg weakness. He did not receive IV t-PA due to being outside the window.    Stroke:   L paramedian pontine infarct secondary to small vessel disease source. However, pt has pAfib on eliquis (compliance). Pt also has multiple cancer history,   Resultant  Dysarthria, and swallowing difficulty  Code Stroke CT no acute stroke. small vessel disease. Aspects 10.   CTA head/neck no ELVO. Atherosclerosis.  Multilevel spondylosis  CT Perfusion negative. No acute infarct  MRI  acute L paramedian pontine infarct. small vessel disease.   LE venous doppler 06/15/16 NO DVT. R&L enlarged lymph nodes  2D Echo  06/15/16 severe LVH. EF 55-60%. No valve abnormalities  Pan CT  No evidence of advanced malignancy  LDL 68   HgbA1c pending  UDS negative  lovenox therapeutic dose for VTE prophylaxis Diet NPO time specified  Eliquis (apixaban) daily prior to admission, now on lovenox therapeutic dose due to NPO status. Once passed swallow or have po access, can switch back to eliquis.  Patient counseled to be compliant with his antithrombotic medications  Ongoing aggressive stroke risk factor management  Therapy recommendations:  pending   Disposition:  pending  (from [facility])  Physical deconditioning, drooling and swallowing difficulty - ? Neuromuscular disorder vs. ? Hx of stroke  Admitted 2 weeks ago and concerning for neuromuscular disease  MG panel negative and mestinon trial negative  Recommended outpt EMG/NCS  Has appointment with [facility] [name] at [facility] on 07/07/16  Pt had left drooling intermittently for the last 2 years  Pt has swallow difficulty for 6 months  CT and MRI showed remote right IC genu infarct and right thalamic punctate infarcts  Wondering whether pt symptoms can be explained by remote infarcts.  Paroxysmal Atrial Fibrillation  Home anticoagulation:  Eliquis (apixaban) daily continued in the hospital  NPO status, failed MBS  On therapeutic lovenox now   Switch back to eliquis 5mg  bid once passed swallow or po access.  History of multiple cancers  Bladder cancer s/p Sx  Prostate cancer s/p Sx  Stomach lymphoma NHL s/p chemo  No recurrence as pt and family aware  Pan CT no evidence of  malignancy  Hypertension  Stable Permissive hypertension (OK if < 180/105) but gradually normalize in 5-7 days Long-term BP goal  normotensive  Hyperlipidemia  Home meds:  zocor 40,resumed in hospital  LDL 68, goal < 70  Statin on hold due to NPO status, resume once pass swallow or have po access  Continue statin at discharge  Other Stroke Risk Factors  Advanced age  Obesity, Body mass index is 31.75 kg/m., recommend weight loss, diet and exercise as appropriate   Other Active Problems  Recent community acquired PNA  BPH on flomax  Abnormal UA WBC 6-30  Normocytic normochronic anemia  GERD w/ H/O Barrett's esophagus  Pt has follow up appointment with Dr. [name] at [facility] on 07/07/16  Hospital day # 2 Plan continue panda tube feeding over the weekend. Reassess swallow early next week and if no improvement may consider PEG every later next week. Discussed with daughter and answered questions.  Antony Contras, MD Stroke Neurology 07/03/2016 8:55 AM     To  contact Stroke Continuity provider, please refer to [email or web address] After hours, contact General Neurology

## 2016-07-03 NOTE — Progress Notes (Signed)
[place] for Lovenox Indication: atrial fibrillation  No Known Allergies  Patient Measurements: Height: 5\' 10"  (177.8 cm) Weight: 221 lb 4.8 oz (100.4 kg) IBW/kg (Calculated) : 73  Vital Signs: Temp: 98.3 F (36.8 C) (04/01 0556) Temp Source: Oral (04/01 0556) BP: 152/58 (04/01 0556) Pulse Rate: 59 (04/01 0556)  Labs:  Recent Labs  06/30/16 1524 06/30/16 1530 07/02/16 0403 07/03/16 0346  HGB 12.1* 12.9* 11.9*  --   HCT 38.3* 38.0* 37.4*  --   PLT 194  --  160  --   APTT 51*  --   --   --   LABPROT 19.0*  --   --   --   INR 1.58  --   --   --   CREATININE 1.21 1.10  --  1.06    Estimated Creatinine Clearance: 59.4 mL/min (by C-G formula based on SCr of 1.06 mg/dL).   Medical History: Past Medical History:  Diagnosis Date  . Arthritis   . Barrett esophagus   . Bladder cancer ([place]) dx'd 1025,8527   surg only  . Colon polyps    adenomatous  . Difficulty sleeping    OCCASIONALLY  . Diverticulosis of colon (without mention of hemorrhage)   . Frequency of urination   . GERD (gastroesophageal reflux disease)   . Hiatal hernia   . History of bladder cancer 1981   EXCISION ONLY  . History of skin cancer    BASAL CELL  . Hyperlipidemia   . Lymphoma, non-Hodgkin's ([place]) 2011   [place]   . Macular degeneration    LEFT EYE  . Multiple nodules of lung 09/16/2011  . PAF (paroxysmal atrial fibrillation) ([place])   . Personal history of colonic polyps 07/15/2010   tubular adenoma  . Prostate cancer [facility])    patient denies  . PUD (peptic ulcer disease)   . Stomach cancer ([place])    NHL origin    Medications:  Prescriptions Prior to Admission  Medication Sig Dispense Refill Last Dose  . apixaban (ELIQUIS) 5 MG TABS tablet Take 5 mg by mouth 2 (two) times daily.   06/30/2016 at 837  . esomeprazole (NEXIUM) 40 MG capsule TAKE 1 CAPSULE (40 MG TOTAL) BY MOUTH 2 (TWO) TIMES DAILY BEFORE A MEAL. 60 capsule 6 06/30/2016 at 0630   . loratadine (CLARITIN) 10 MG tablet Take 10 mg by mouth daily as needed for allergies.   PRN at PRN  . Multiple Vitamins-Minerals (ICAPS MV PO) Take 1 capsule by mouth 2 (two) times daily.    06/30/2016 at 0837  . Polyethyl Glycol-Propyl Glycol (SYSTANE) 0.4-0.3 % SOLN Place 1 drop into both eyes daily as needed (for dry eyes).    PRN at PRN  . simvastatin (ZOCOR) 40 MG tablet Take 40 mg by mouth at bedtime.     06/29/2016 at 2121  . tamsulosin (FLOMAX) 0.4 MG CAPS capsule Take 0.4 mg by mouth at bedtime.   06/29/2016 at 2121  . traZODone (DESYREL) 50 MG tablet Take 50 mg by mouth at bedtime as needed for sleep.   PRN at PRN  . fish oil-omega-3 fatty acids 1000 MG capsule Take 1 g by mouth 2 (two) times daily.    Not Taking at Unknown time    Assessment: Patient on apixaban PTA for PAF, admitted with acute CVA. Apixaban currently on hold d/t NPO status and transitioned to Lovenox for treatment.  Remains on Lovenox 1 mg/kg every 12 hours w/o any  reported bleeding issues. NGT placed but unknown if patient will require percutaneous gastric tube.  Goal of Therapy:  Anti-Xa level 0.6-1 units/ml 4hrs after LMWH dose given Monitor platelets by anticoagulation protocol: Yes   Plan:  1. Continue Lovenox 1mg /kg or 100 mg subcutaneously every 12 hours  2. Await decision on need for feeding tube and SLP evaluation and transition back to apixaban when feasible    Thank you for allowing [place] to participate in this patients care.  Vincenza Hews, PharmD, BCPS 07/03/2016, 10:46 AM

## 2016-07-03 NOTE — Progress Notes (Signed)
[facility] PROGRESS NOTE  GRANGER CHUI HAL:[id number] DOB: 1927-05-01 DOA: 06/30/2016   PCP: Melinda Crutch, MD     Assessment/Plan: Principal Problem:   Hemiplegia affecting dominant side ([place]) Active Problems:   Hyperlipidemia   GERD (gastroesophageal reflux disease)   BPH (benign prostatic hyperplasia)   PAF (paroxysmal atrial fibrillation) (HCC)   Dysphagia   CVA (cerebral vascular accident) ([place])   Dysarthria   History of cancer   83 y.o. right handed male with medical history significant of PAF on Eliquis, Barrett's esophagus, bladder cancer, non-Hodgkin's lymphoma, OA, and HLD;  who presents with acute onset of slurred speech, right-sided facial droop, and right sided upper and lower extremity weakness. Patient's daughter helps provide history and states that when she went to visit the patient at the rehabilitation facility she noticed that his speech was significantly slurred to the point where she was unable to understand him.He had just been recently hospitalized from 3/14 -3/19 for community-acquired pneumonia. During hospitalization he had an event of unresponsiveness with bradycardia, but cardiology felt it was noncardiac in nature. Patient had negative MRI, MRA, and EEG studies. Due to swallowing difficulties patient was evaluated by speech therapy and seen to be silently aspirating.Upon discharge he was sent to a skilled nursing facility for rehabilitation and completed a full course of Levaquin on 3/21.Neurology was called and patient was brought for a emergent CT perfusion to evaluate for M1 stenosis or occlusion.   Assessment/Plan  Stroke:   L paramedian pontine infarct secondary to small vessel disease source. However, pt has pAfib on eliquis    CTA head/neck no ELVO. Atherosclerosis. Multilevel spondylosis  CT Perfusion negative. No acute infarct  MRI  acute L paramedian pontine infarct. small vessel disease.   LE venous doppler 06/15/16 NO DVT. R&L  enlarged lymph nodes  2D Echo  06/15/16 severe LVH. EF 55-60%. No valve abnormalities  Pan CT negative for malignancy,No recurrence of lymphadenopathy identified within chest, abdomen or pelvis  LDL 68 ,Statin via  cor trak tube -  HgbA1c 5.5  UDS negative  lovenox therapeutic dose ,  Npo as per speech therapy, currently has NG tube, may need to consider PEG tube  Eliquis (apixaban) daily prior to admission, now on lovenox therapeutic dose due to NPO status. Once passed swallow or have po established, can switch back to eliquis.  Therapy recommendations: SNF [name] Pt has follow up appointment with Dr. [name] at [facility] on 07/07/16   Physical deconditioning, drooling and swallowing difficulty - ? Neuromuscular disorder vs. ? Hx of stroke Admitted 2 weeks ago and concerning for neuromuscular disease MG panel negative  Recommended outpt EMG/NCS Has appointment with Dr. [name] at [facility] on 07/07/16 Pt had left drooling intermittently for the last 2 years Pt has swallow difficulty for 6 months CT and MRI showed remote right IC genu infarct and right thalamic punctate infarcts     Previous diet recommendations from hospitalization on 3/14:  Previously on Dysphagia 3 (mechanical soft);Thin liquid Currently nothing by mouth    Recent community-acquired pneumonia: Chest x-ray overall improved. - Continue Mucinex, completed Levaquin  Paroxysmal atrial fibrillation: Chadsvasc score = 5 - Continue Eliquis when by mouth intake established  Hyperlipidemia - Continue simvastatin  BPH - Continue Flomax    Abnormal UA - Check urine culture   Normocytic normochromic anemia: Stable - Continue to monitor   GERD with H/O Barrett's esophagus - Pharmacy substitution of Protonix for Nexium   obesity, Body  mass index is 31.75 kg/m., recommend weight loss, diet and exercise as appropriate    DVT prophylaxsis Lovenox >eliquis  Code Status:   Full code   Family  Communication: Discussed in detail with the patient, all imaging results, lab results explained to the patient   Disposition Plan:  SNF likely next week      Consultants: Neurology  Procedures: None  Antibiotics: Anti-infectives    None         HPI/Subjective:  Patient seen in the presence of his daughter by the bedside, continues to have facial droop and slurred speech, some diarrhea  Objective: Vitals:   07/02/16 1628 07/02/16 2109 07/03/16 0155 07/03/16 0556  BP: (!) 160/70 (!) 151/64 (!) 148/55 (!) 152/58  Pulse: 65 72 63 (!) 59  Resp: 19 18 20 18   Temp: 97.9 F (36.6 C) 98.2 F (36.8 C) 98.7 F (37.1 C) 98.3 F (36.8 C)  TempSrc: Oral Oral Oral Oral  SpO2: 100% 99% 95% 99%  Weight:      Height:        Intake/Output Summary (Last 24 hours) at 07/03/16 1006 Last data filed at 07/02/16 2000  Gross per 24 hour  Intake           126.75 ml  Output              180 ml  Net           -53.25 ml    Exam:  Examination:  General exam: Appears calm and comfortable  Respiratory system: Clear to auscultation. Respiratory effort normal. Cardiovascular system: S1 & S2 heard, RRR. No JVD, murmurs, rubs, gallops or clicks. No pedal edema. Gastrointestinal system: Abdomen is nondistended, soft and nontender. No organomegaly or masses felt. Normal bowel sounds heard. Central nervous system:   dysarthria,right nasolabial fold flattening Extremities: Symmetric 5 x 5 power. Skin: No rashes, lesions or ulcers Psychiatry: Judgement and insight appear normal. Mood & affect appropriate.     Data Reviewed: I have personally reviewed following labs and imaging studies  Micro Results Recent Results (from the past 240 hour(s))  Culture, Urine     Status: Abnormal   Collection Time: 06/30/16  5:30 PM  Result Value Ref Range Status   Specimen Description URINE, RANDOM  Final   Special Requests NONE  Final   Culture <10,000 COLONIES/mL INSIGNIFICANT GROWTH (A)  Final    Report Status 07/02/2016 FINAL  Final    Radiology Reports Ct Angio Head W Or Wo Contrast  Result Date: 06/30/2016 CLINICAL DATA:  New onset right-sided facial droop and intermittent dysarthria over the last 2 days. EXAM: CT ANGIOGRAPHY HEAD AND NECK TECHNIQUE: Multidetector CT imaging of the head and neck was performed using the standard protocol during bolus administration of intravenous contrast. Multiplanar CT image reconstructions and MIPs were obtained to evaluate the vascular anatomy. Carotid stenosis measurements (when applicable) are obtained utilizing NASCET criteria, using the distal internal carotid diameter as the denominator. CONTRAST:  50 mL Isovue 370 COMPARISON:  CT head without contrast from the same day. MRI brain 06/16/2016 FINDINGS: CTA NECK FINDINGS Aortic arch: A 3 vessel arch configuration is present. Atherosclerotic changes are present at the aortic arch without significant stenosis at the great vessel origins. There is no aneurysm. Right carotid system: Common carotid artery is tortuous proximally. There is no focal stenosis. Atherosclerotic calcifications are present at the right carotid bifurcation without significant stenosis relative to the more distal vessel. Remainder of the right internal carotid artery  is normal. Left carotid system: The left common carotid artery is tortuous proximally. There is no significant stenosis. Atherosclerotic changes are present at the carotid bifurcation without significant stenosis. The more distal left internal carotid artery is normal. Vertebral arteries: Both vertebral arteries originate from the subclavian arteries without significant stenoses. The vertebral arteries are codominant. There is no significant stenosis or vascular injury to either vertebral artery in the neck. Skeleton: Mild endplate degenerative and facet changes are present. No focal lytic or blastic lesions are present. There is exaggerated thoracic kyphosis and cervical  lordosis. Other neck: No focal mucosal or submucosal lesions are present. Bilateral thyroid nodules are present without a dominant lesion. There is no significant adenopathy. The salivary glands are within normal limits. Upper chest: The lung apices demonstrate bilateral dependent atelectasis. No focal nodule or mass lesion is present. Review of the MIP images confirms the above findings CTA HEAD FINDINGS Anterior circulation: Atherosclerotic calcifications are present within the cavernous internal carotid arteries bilaterally without a significant stenosis through the ICA terminus. The A1 and M1 segments are normal. The anterior communicating artery is patent. The MCA bifurcations are intact. There is segmental attenuation of ACA and MCA branch vessels bilaterally without a significant proximal stenosis or occlusion. Posterior circulation: The vertebral arteries are codominant. Atherosclerotic changes are present at the dural margin. The PICA origins are visualized and normal. The basilar artery is normal. Both posterior cerebral arteries originate from the basilar tip. There is no significant proximal stenosis or occlusion within either posterior cerebral artery. Venous sinuses: The dural sinuses are patent. The right transverse sinus is dominant. The straight sinus deep cerebral veins are intact. The cortical veins are unremarkable. Anatomic variants: None Delayed phase: Not performed. Arterial source images are unremarkable. Review of the MIP images confirms the above findings IMPRESSION: 1. No emerging large vessel occlusion. 2. Atherosclerotic changes at the aortic arch, carotid bifurcations, dural margin of vertebral arteries, and cavernous internal carotid arteries without significant stenoses. 3. Mild diffuse segmental branch vessel irregularity within both the anterior and posterior circulation suggesting intracranial atherosclerotic change. There is no significant proximal stenosis or occlusion. 4.  Multilevel spondylosis of the cervical spine. Electronically Signed   By: San Morelle M.D.   On: 06/30/2016 17:03   Dg Chest 2 View  Result Date: 06/30/2016 CLINICAL DATA:  Cough, recent episode of pneumonia, stroke symptoms. History of bladder malignancy EXAM: CHEST  2 VIEW COMPARISON:  Chest x-ray of June 15, 2016 FINDINGS: The lungs are adequately inflated. The interstitial markings are coarse in the mid and lower lung zones but have improved in the upper lobes. The cardiac silhouette remains enlarged. The pulmonary vascularity is not engorged. There is no pleural effusion. There is calcification in the wall of the thoracic aorta. There is multilevel degenerative disc disease of the thoracic spine. IMPRESSION: There has been some improvement in the appearance of the lungs especially upper lobes but coarse interstitial markings persist in the lower lobes. There is no discrete pneumonia. There is stable cardiomegaly without pulmonary vascular congestion. Thoracic aortic atherosclerosis. Electronically Signed   By: David  Martinique M.D.   On: 06/30/2016 15:47   Dg Chest 2 View  Result Date: 06/15/2016 CLINICAL DATA:  Dizziness and confusion EXAM: CHEST  2 VIEW COMPARISON:  Aug 09, 2013 FINDINGS: There is cardiomegaly with mild pulmonary venous hypertension. There is mild interstitial edema in the lung bases. There is focal airspace consolidation in the medial left base. Lungs elsewhere clear. No adenopathy. No bone  lesions. IMPRESSION: Findings consistent with a degree of congestive heart failure. Suspect superimposed pneumonia medial left base. Followup PA and lateral chest radiographs recommended in 3-4 weeks following trial of antibiotic therapy to ensure resolution and exclude underlying malignancy. Electronically Signed   By: Lowella Grip III M.D.   On: 06/15/2016 07:58   Ct Head Wo Contrast  Result Date: 06/15/2016 CLINICAL DATA:  Lower extremity weakness.  Fall. EXAM: CT HEAD WITHOUT  CONTRAST TECHNIQUE: Contiguous axial images were obtained from the base of the skull through the vertex without intravenous contrast. COMPARISON:  Head CT November 15, 2015 FINDINGS: Brain: Moderate diffuse atrophy is stable. There is no intracranial mass, hemorrhage, extra-axial fluid collection, or midline shift. There is mild patchy small vessel disease in the centra semiovale bilaterally. Elsewhere gray-white compartments appear normal. No acute infarct evident. Vascular: No hyperdense vessel. There are foci of calcification in each carotid siphon region as well as in each distal vertebral artery. Skull: The bony calvarium appears intact. Sinuses/Orbits: There is mucosal thickening and opacification in multiple ethmoid air cells. There is mucosal thickening in the anteromedial right sphenoid sinus. Visualized paranasal sinuses elsewhere are clear. Visualized orbits appear symmetric bilaterally with the exception of apparent cataract removal on the right. Other: Mastoid air cells are clear. IMPRESSION: Atrophy with relatively mild periventricular small vessel disease, stable. No intracranial mass, hemorrhage, or extra-axial fluid collection. No acute appearing infarct. Areas of arterial vascular calcification noted. There is paranasal sinus disease, primarily in multiple ethmoid air cells. Electronically Signed   By: Lowella Grip III M.D.   On: 06/15/2016 08:10   Ct Angio Neck W Or Wo Contrast  Result Date: 06/30/2016 CLINICAL DATA:  New onset right-sided facial droop and intermittent dysarthria over the last 2 days. EXAM: CT ANGIOGRAPHY HEAD AND NECK TECHNIQUE: Multidetector CT imaging of the head and neck was performed using the standard protocol during bolus administration of intravenous contrast. Multiplanar CT image reconstructions and MIPs were obtained to evaluate the vascular anatomy. Carotid stenosis measurements (when applicable) are obtained utilizing NASCET criteria, using the distal internal  carotid diameter as the denominator. CONTRAST:  50 mL Isovue 370 COMPARISON:  CT head without contrast from the same day. MRI brain 06/16/2016 FINDINGS: CTA NECK FINDINGS Aortic arch: A 3 vessel arch configuration is present. Atherosclerotic changes are present at the aortic arch without significant stenosis at the great vessel origins. There is no aneurysm. Right carotid system: Common carotid artery is tortuous proximally. There is no focal stenosis. Atherosclerotic calcifications are present at the right carotid bifurcation without significant stenosis relative to the more distal vessel. Remainder of the right internal carotid artery is normal. Left carotid system: The left common carotid artery is tortuous proximally. There is no significant stenosis. Atherosclerotic changes are present at the carotid bifurcation without significant stenosis. The more distal left internal carotid artery is normal. Vertebral arteries: Both vertebral arteries originate from the subclavian arteries without significant stenoses. The vertebral arteries are codominant. There is no significant stenosis or vascular injury to either vertebral artery in the neck. Skeleton: Mild endplate degenerative and facet changes are present. No focal lytic or blastic lesions are present. There is exaggerated thoracic kyphosis and cervical lordosis. Other neck: No focal mucosal or submucosal lesions are present. Bilateral thyroid nodules are present without a dominant lesion. There is no significant adenopathy. The salivary glands are within normal limits. Upper chest: The lung apices demonstrate bilateral dependent atelectasis. No focal nodule or mass lesion is present. Review of  the MIP images confirms the above findings CTA HEAD FINDINGS Anterior circulation: Atherosclerotic calcifications are present within the cavernous internal carotid arteries bilaterally without a significant stenosis through the ICA terminus. The A1 and M1 segments are  normal. The anterior communicating artery is patent. The MCA bifurcations are intact. There is segmental attenuation of ACA and MCA branch vessels bilaterally without a significant proximal stenosis or occlusion. Posterior circulation: The vertebral arteries are codominant. Atherosclerotic changes are present at the dural margin. The PICA origins are visualized and normal. The basilar artery is normal. Both posterior cerebral arteries originate from the basilar tip. There is no significant proximal stenosis or occlusion within either posterior cerebral artery. Venous sinuses: The dural sinuses are patent. The right transverse sinus is dominant. The straight sinus deep cerebral veins are intact. The cortical veins are unremarkable. Anatomic variants: None Delayed phase: Not performed. Arterial source images are unremarkable. Review of the MIP images confirms the above findings IMPRESSION: 1. No emerging large vessel occlusion. 2. Atherosclerotic changes at the aortic arch, carotid bifurcations, dural margin of vertebral arteries, and cavernous internal carotid arteries without significant stenoses. 3. Mild diffuse segmental branch vessel irregularity within both the anterior and posterior circulation suggesting intracranial atherosclerotic change. There is no significant proximal stenosis or occlusion. 4. Multilevel spondylosis of the cervical spine. Electronically Signed   By: San Morelle M.D.   On: 06/30/2016 17:03   Ct Chest W Contrast  Result Date: 07/01/2016 CLINICAL DATA:  Bladder cancer, non-Hodgkin's lymphoma and Barrett's esophagus. EXAM: CT CHEST, ABDOMEN, AND PELVIS WITH CONTRAST TECHNIQUE: Multidetector CT imaging of the chest, abdomen and pelvis was performed following the standard protocol during bolus administration of intravenous contrast. CONTRAST:  146mL ISOVUE-300 IOPAMIDOL (ISOVUE-300) INJECTION 61% COMPARISON:  CXR from 06/30/2016, chest CT from 09/09/2011, CT chest, abdomen and  pelvis from 09/09/2011 FINDINGS: CT CHEST FINDINGS Cardiovascular: Undo top normal size cardiac chambers without pericardial effusion. No large central pulmonary embolus. Aortic atherosclerosis without aneurysm. No dissection. Mild ectasia of the ascending aorta as before up to 3.9 cm. Normal branch pattern for the great vessels. Mediastinum/Nodes: Stable subcentimeter left-sided thyroid nodules since 2013 consistent with benign findings. The largest currently is approximately 12 mm. Fat containing right lower paratracheal lymph node measuring up to 13 mm is without significant change. Stable hilar lymph nodes, the largest on the right approximately 9 mm short axis and on the left, 8 mm short axis. Lungs/Pleura: Stable calcified and noncalcified subcentimeter pulmonary nodules dating back to 2013 consistent with benign findings, index nodule the left upper lobe measuring 7 mm, series 5, image 76. Bibasilar dependent atelectasis. There is lingular, right middle lobe and both lower lobe bronchiectasis with mild peribronchial thickening and chronic minimal parenchymal scarring and/or atelectasis. Musculoskeletal: Lower thoracic kyphosis attributable to multilevel degenerative disc disease. Small osteophytes are noted along the anterior aspect of the mid and lower thoracic spine. No acute nor suspicious osseous abnormalities. CT ABDOMEN PELVIS FINDINGS Hepatobiliary: Status post cholecystectomy. No space-occupying mass of the liver. No biliary dilatation. Pancreas: Unremarkable. No pancreatic ductal dilatation or surrounding inflammatory changes. Spleen: Normal in size without focal abnormality. Adrenals/Urinary Tract: Adrenal glands are unremarkable. Kidneys are normal, without renal calculi, focal lesion, or hydronephrosis. Bladder is unremarkable. Stomach/Bowel: Stomach is within normal limits. Appendix is surgically absent by report. No evidence of bowel wall thickening, distention, or inflammatory changes.  Vascular/Lymphatic: Bandlike mesenteric soft tissue attenuation with calcification in the mid abdomen is slightly more prominent than on prior consistent with treated lymphoma.  Small scattered mesenteric lymph nodes are seen in the right lower quadrant and root of the mesentery as before. No inguinal or pelvic sidewall adenopathy. No retroperitoneal or mesenteric lymphadenopathy. No porta hepatic nor epigastric enlargement of lymph nodes Reproductive: Prostate is unremarkable. Other: No abdominal wall hernia or abnormality. No abdominopelvic ascites. Musculoskeletal: No acute or significant osseous findings. Degenerative disc disease L4-5. IMPRESSION: 1. Chronic since 2013 are stable pulmonary nodules noted bilaterally, index nodule the left upper lobe measures 7 mm consistent with benign findings. 2. Hazy mesenteric fat in the mid abdomen with calcification consistent with treated non-Hodgkin's lymphoma. No recurrence of lymphadenopathy identified within chest, abdomen or pelvis. 3. Cholecystectomy. Electronically Signed   By: Ashley Royalty M.D.   On: 07/01/2016 20:12   Mr Brain Wo Contrast  Result Date: 07/01/2016 CLINICAL DATA:  Acute presentation with slurred speech and right-sided facial droop. Right upper and lower extremity weakness. EXAM: MRI HEAD WITHOUT CONTRAST TECHNIQUE: Multiplanar, multiecho pulse sequences of the brain and surrounding structures were obtained without intravenous contrast. COMPARISON:  Multiple CT studies done yesterday.  MRI 06/16/2016. FINDINGS: Brain: There is acute infarction affecting the left para median pons. No acute infarction affecting the cerebral hemispheres. No focal cerebellar insult. Few old small vessel infarctions within the thalami I. Moderate chronic small-vessel ischemic changes of the cerebral hemispheric white matter. No cortical or large vessel territory infarction. No mass lesion, hemorrhage, hydrocephalus or extra-axial collection. Vascular: Major vessels at  the base of the brain show flow. Skull and upper cervical spine: Negative Sinuses/Orbits: Clear except for mild maxillary sinus mucosal thickening and retention cyst. Orbits negative. Other: None significant IMPRESSION: Acute infarction affecting the left para median pons. No hemorrhage. Chronic small-vessel ischemic changes elsewhere as outlined above. Electronically Signed   By: Nelson Chimes M.D.   On: 07/01/2016 07:11   Mr Brain Wo Contrast  Result Date: 06/16/2016 CLINICAL DATA:  83 y/o  M; generalized weakness and fall. EXAM: MRI HEAD WITHOUT CONTRAST TECHNIQUE: Multiplanar, multiecho pulse sequences of the brain and surrounding structures were obtained without intravenous contrast. COMPARISON:  06/15/2016 CT of the head. FINDINGS: Brain: No acute infarction, hemorrhage, hydrocephalus, extra-axial collection or mass lesion. Foci of T2 FLAIR hyperintense signal abnormality in subcortical and periventricular white matter are nonspecific are compatible with moderate chronic microvascular ischemic changes of the brain. Mild brain parenchymal volume loss for age. Small chronic lacunar infarct within the left caudate head and hemosiderin stained chronic lacunar infarct in right lentiform nucleus. Vascular: Normal flow voids. Skull and upper cervical spine: Normal marrow signal. Sinuses/Orbits: Mild ethmoid and maxillary sinus mucosal thickening and large right maxillary sinus mucous retention cyst. No abnormal signal of the mastoid air cells. Right intra-ocular lens replacement. Other: None. IMPRESSION: 1. No acute intracranial abnormality. 2. Moderate chronic microvascular ischemic changes and mild parenchymal volume loss of the brain. Small chronic lacunar infarcts within right lentiform nucleus and left caudate head. 3. Mild paranasal sinus disease. Electronically Signed   By: Kristine Garbe M.D.   On: 06/16/2016 19:20   Mr Cervical Spine Wo Contrast  Result Date: 06/17/2016 CLINICAL DATA:   Progressive weakness.  Drooling. EXAM: MRI CERVICAL SPINE WITHOUT CONTRAST TECHNIQUE: Multiplanar, multisequence MR imaging of the cervical spine was performed. No intravenous contrast was administered. COMPARISON:  None. FINDINGS: Alignment: Exaggerated lordosis.  No listhesis. Vertebrae: No fracture, evidence of discitis, or bone lesion. Mild marrow edema about the left C4-5 facet, degenerative appearing. Cord: Normal signal and morphology. Posterior Fossa, vertebral arteries, paraspinal  tissues: Bilateral thyroid nodules, up to 12 mm on the left, below size threshold for recommend sonographic follow-up. Disc levels: C2-3: Facet arthropathy with moderate spurring on the left. Ligamentum flavum thickening. Patent canal and foramina C3-4: Facet arthropathy with spurring greater on the left. Ligamentum flavum thickening. Mild endplate ridging. Patent canal and foramina C4-5: Facet arthropathy with moderate bilateral spurring. Ligamentum flavum thickening. No impingement C5-6: Facet arthropathy with mild spurring. Ligamentum flavum thickening. Disc narrowing with posterior disc osteophyte complex. No impingement C6-7: Mild facet spurring.  Mild disc narrowing.  No impingement C7-T1:Mild facet arthropathy with possible ankylosis on the right. Mild disc narrowing. No impingement IMPRESSION: 1. No explanation for weakness. The canal and foramina are diffusely patent. No evidence of myelopathy. 2. Facet arthropathy and mild for age disc degeneration. Mild degenerative marrow edema noted about the left C4-5 facet. Electronically Signed   By: Monte Fantasia M.D.   On: 06/17/2016 12:41   Ct Abdomen Pelvis W Contrast  Result Date: 07/01/2016 CLINICAL DATA:  Bladder cancer, non-Hodgkin's lymphoma and Barrett's esophagus. EXAM: CT CHEST, ABDOMEN, AND PELVIS WITH CONTRAST TECHNIQUE: Multidetector CT imaging of the chest, abdomen and pelvis was performed following the standard protocol during bolus administration of  intravenous contrast. CONTRAST:  1105mL ISOVUE-300 IOPAMIDOL (ISOVUE-300) INJECTION 61% COMPARISON:  CXR from 06/30/2016, chest CT from 09/09/2011, CT chest, abdomen and pelvis from 09/09/2011 FINDINGS: CT CHEST FINDINGS Cardiovascular: Undo top normal size cardiac chambers without pericardial effusion. No large central pulmonary embolus. Aortic atherosclerosis without aneurysm. No dissection. Mild ectasia of the ascending aorta as before up to 3.9 cm. Normal branch pattern for the great vessels. Mediastinum/Nodes: Stable subcentimeter left-sided thyroid nodules since 2013 consistent with benign findings. The largest currently is approximately 12 mm. Fat containing right lower paratracheal lymph node measuring up to 13 mm is without significant change. Stable hilar lymph nodes, the largest on the right approximately 9 mm short axis and on the left, 8 mm short axis. Lungs/Pleura: Stable calcified and noncalcified subcentimeter pulmonary nodules dating back to 2013 consistent with benign findings, index nodule the left upper lobe measuring 7 mm, series 5, image 76. Bibasilar dependent atelectasis. There is lingular, right middle lobe and both lower lobe bronchiectasis with mild peribronchial thickening and chronic minimal parenchymal scarring and/or atelectasis. Musculoskeletal: Lower thoracic kyphosis attributable to multilevel degenerative disc disease. Small osteophytes are noted along the anterior aspect of the mid and lower thoracic spine. No acute nor suspicious osseous abnormalities. CT ABDOMEN PELVIS FINDINGS Hepatobiliary: Status post cholecystectomy. No space-occupying mass of the liver. No biliary dilatation. Pancreas: Unremarkable. No pancreatic ductal dilatation or surrounding inflammatory changes. Spleen: Normal in size without focal abnormality. Adrenals/Urinary Tract: Adrenal glands are unremarkable. Kidneys are normal, without renal calculi, focal lesion, or hydronephrosis. Bladder is unremarkable.  Stomach/Bowel: Stomach is within normal limits. Appendix is surgically absent by report. No evidence of bowel wall thickening, distention, or inflammatory changes. Vascular/Lymphatic: Bandlike mesenteric soft tissue attenuation with calcification in the mid abdomen is slightly more prominent than on prior consistent with treated lymphoma. Small scattered mesenteric lymph nodes are seen in the right lower quadrant and root of the mesentery as before. No inguinal or pelvic sidewall adenopathy. No retroperitoneal or mesenteric lymphadenopathy. No porta hepatic nor epigastric enlargement of lymph nodes Reproductive: Prostate is unremarkable. Other: No abdominal wall hernia or abnormality. No abdominopelvic ascites. Musculoskeletal: No acute or significant osseous findings. Degenerative disc disease L4-5. IMPRESSION: 1. Chronic since 2013 are stable pulmonary nodules noted bilaterally, index nodule the left  upper lobe measures 7 mm consistent with benign findings. 2. Hazy mesenteric fat in the mid abdomen with calcification consistent with treated non-Hodgkin's lymphoma. No recurrence of lymphadenopathy identified within chest, abdomen or pelvis. 3. Cholecystectomy. Electronically Signed   By: Ashley Royalty M.D.   On: 07/01/2016 20:12   Ct Cerebral Perfusion W Contrast  Result Date: 06/30/2016 CLINICAL DATA:  Dysarthria.  Right-sided facial droop. EXAM: CT PERFUSION BRAIN TECHNIQUE: Multiphase CT imaging of the brain was performed following IV bolus contrast injection. Subsequent parametric perfusion maps were calculated using RAPID software. CONTRAST:  40 mL Isovue 370 COMPARISON:  None. FINDINGS: CT Brain Perfusion Findings: CBF (<30%) Volume: 60mL Perfusion (Tmax>6.0s) volume: 61mL Mismatch Volume: 90mL Infarction Location:None No significant patient motion is present. Arterial input and venous output functions are excellent. IMPRESSION: Negative CT perfusion exam of the head. No evidence for acute infarct or ischemia.  Electronically Signed   By: San Morelle M.D.   On: 06/30/2016 16:54   Dg Abd Portable 1v  Result Date: 07/02/2016 CLINICAL DATA:  Feeding tube placement. EXAM: PORTABLE ABDOMEN - 1 VIEW COMPARISON:  Abdomen and pelvis CT dated 07/01/2016. FINDINGS: Feeding tube tip in the mid stomach. Normal bowel gas pattern. Oral contrast in the right colon. Cholecystectomy clips. Thoracolumbar spine degenerative changes. IMPRESSION: Feeding tube tip in the mid stomach. Electronically Signed   By: Claudie Revering M.D.   On: 07/02/2016 15:57   Dg Swallowing Func-speech Pathology  Result Date: 07/01/2016 Objective Swallowing Evaluation: Type of Study: MBS-Modified Barium Swallow Study Patient Details Name: CHURCHILL GRIMSLEY MRN: [id number] Date of Birth: 20-May-1927 Today's Date: 07/01/2016 Time: SLP Start Time (ACUTE ONLY): 0910-SLP Stop Time (ACUTE ONLY): 0933 SLP Time Calculation (min) (ACUTE ONLY): 23 min Past Medical History: Past Medical History: Diagnosis Date . Arthritis  . Barrett esophagus  . Bladder cancer ([place]) dx'd 6606,3016  surg only . Colon polyps   adenomatous . Difficulty sleeping   OCCASIONALLY . Diverticulosis of colon (without mention of hemorrhage)  . Frequency of urination  . GERD (gastroesophageal reflux disease)  . Hiatal hernia  . History of bladder cancer 1981  EXCISION ONLY . History of skin cancer   BASAL CELL . Hyperlipidemia  . Lymphoma, non-Hodgkin's ([place]) 2011  [place]  . Macular degeneration   LEFT EYE . Multiple nodules of lung 09/16/2011 . PAF (paroxysmal atrial fibrillation) ([place])  . Personal history of colonic polyps 07/15/2010  tubular adenoma . Prostate cancer [facility])   patient denies . PUD (peptic ulcer disease)  . Stomach cancer ([place])   NHL origin Past Surgical History: Past Surgical History: Procedure Laterality Date . APPENDECTOMY   . bone spur    left shoulder . CHOLECYSTECTOMY   . KNEE ARTHROPLASTY Left 07/30/2015  Procedure: LEFT TOTAL KNEE ARTHROPLASTY WITH COMPUTER  NAVIGATION;  Surgeon: Rod Can, MD;  Location: [facility];  Service: Orthopedics;  Laterality: Left; . LASER OF PROSTATE W/ GREEN LIGHT PVP   . TENDON REPAIR    right arm . TRIGGER FINGER RELEASE    bilateral hands . WRIST SURGERY    Bilateral HPI: Ptis an 83 y.o. malewith PMH significant of PAF, Barrett's esophagus, bladder cancer, non-Hodgkin's lymphoma, OA, andHLD; who presents with acute onset of slurred speech, right-sided facial droop, and right sided upper and lower extremity weakness. He had just been recently hospitalized from 3/14 -3/19 for community-acquired pneumonia. Due to swallowing difficulties patient was evaluated by speech therapy and seen to be silently aspirating. Patient was placed on modified  diet (recommended Dys 3 solids, thin liquids). Upon discharge he was sent to a skilled nursing facilityfor rehabilitation. 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 No hemorrhage. Chronic small-vessel ischemic changes elsewhere. CXR showed some improvement in the appearance of the lungs especially upper lobes but coarse interstitial markings persist in the lower lobes. There is no discrete pneumonia. There is stablecardiomegaly without pulmonary vascular congestion.Thoracic aortic atherosclerosis. No Data Recorded Assessment / Plan / Recommendation CHL IP CLINICAL IMPRESSIONS 07/01/2016 Clinical Impression Pt has had a decline in swallow function this admission following acute pontine CVA. Impairments primarily motor based with decreased laryngeal elevation, laryngeal closure and reduced tongue base retraction. Silent penetration and aspiration during initial and multiple swallows with nectar and honey thick consistencies. Chin tuck was ineffective to mitigate laryngeal invasion or to decrease vallecular residue during swallow. Pt required multiple verbal prompts for cough/throat clear which were weak and unable to clear vestibule. Although puree was not  penetrated or aspirated, volume was significant and remained increasing risk. MBS does not diagnose below the level of the UES, however esophageal scan revealed stasis in distal esophagus. Recommend NPO status, oral care, short term alternative nutrition and continue ST intervention. From ST/swallow standpoint, pt is an excellent candidate for inpatient rehab.   SLP Visit Diagnosis Dysphagia, oropharyngeal phase (R13.12) Attention and concentration deficit following -- Frontal lobe and executive function deficit following -- Impact on safety and function --   CHL IP TREATMENT RECOMMENDATION 07/01/2016 Treatment Recommendations Therapy as outlined in treatment plan below   Prognosis 07/01/2016 Prognosis for Safe Diet Advancement Good Barriers to Reach Goals -- Barriers/Prognosis Comment -- CHL IP DIET RECOMMENDATION 07/01/2016 SLP Diet Recommendations NPO Liquid Administration via -- Medication Administration Via alternative means Compensations -- Postural Changes --   CHL IP OTHER RECOMMENDATIONS 07/01/2016 Recommended Consults -- Oral Care Recommendations Oral care QID Other Recommendations --   CHL IP FOLLOW UP RECOMMENDATIONS 07/01/2016 Follow up Recommendations Inpatient Rehab   CHL IP FREQUENCY AND DURATION 07/01/2016 Speech Therapy Frequency (ACUTE ONLY) min 2x/week Treatment Duration 2 weeks      CHL IP ORAL PHASE 07/01/2016 Oral Phase Impaired Oral - Pudding Teaspoon -- Oral - Pudding Cup -- Oral - Honey Teaspoon -- Oral - Honey Cup Lingual/palatal residue Oral - Nectar Teaspoon -- Oral - Nectar Cup Lingual/palatal residue Oral - Nectar Straw -- Oral - Thin Teaspoon -- Oral - Thin Cup Lingual/palatal residue Oral - Thin Straw -- Oral - Puree Lingual/palatal residue Oral - Mech Soft NT Oral - Regular -- Oral - Multi-Consistency -- Oral - Pill -- Oral Phase - Comment --  CHL IP PHARYNGEAL PHASE 07/01/2016 Pharyngeal Phase Impaired Pharyngeal- Pudding Teaspoon -- Pharyngeal -- Pharyngeal- Pudding Cup -- Pharyngeal --  Pharyngeal- Honey Teaspoon -- Pharyngeal -- Pharyngeal- Honey Cup Pharyngeal residue - valleculae;Reduced tongue base retraction;Penetration/Aspiration during swallow;Reduced laryngeal elevation;Reduced airway/laryngeal closure Pharyngeal Material enters airway, passes BELOW cords without attempt by patient to eject out (silent aspiration);Material enters airway, CONTACTS cords and not ejected out Pharyngeal- Nectar Teaspoon -- Pharyngeal -- Pharyngeal- Nectar Cup Delayed swallow initiation-vallecula;Pharyngeal residue - valleculae;Reduced tongue base retraction;Reduced laryngeal elevation;Penetration/Aspiration during swallow;Reduced airway/laryngeal closure;Pharyngeal residue - pyriform Pharyngeal Material enters airway, passes BELOW cords without attempt by patient to eject out (silent aspiration);Material enters airway, CONTACTS cords and not ejected out Pharyngeal- Nectar Straw -- Pharyngeal -- Pharyngeal- Thin Teaspoon -- Pharyngeal -- Pharyngeal- Thin Cup NT Pharyngeal -- Pharyngeal- Thin Straw -- Pharyngeal -- Pharyngeal- Puree Pharyngeal residue - valleculae;Reduced  tongue base retraction Pharyngeal -- Pharyngeal- Mechanical Soft NT Pharyngeal -- Pharyngeal- Regular -- Pharyngeal -- Pharyngeal- Multi-consistency -- Pharyngeal -- Pharyngeal- Pill -- Pharyngeal -- Pharyngeal Comment --  CHL IP CERVICAL ESOPHAGEAL PHASE 07/01/2016 Cervical Esophageal Phase WFL Pudding Teaspoon -- Pudding Cup -- Honey Teaspoon -- Honey Cup -- Nectar Teaspoon -- Nectar Cup -- Nectar Straw -- Thin Teaspoon -- Thin Cup -- Thin Straw -- Puree -- Mechanical Soft -- Regular -- Multi-consistency -- Pill -- Cervical Esophageal Comment -- CHL IP GO 06/16/2016 Functional Assessment Tool Used skilled clinical judgement Functional Limitations Swallowing Swallow Current Status (U0454) CK Swallow Goal Status (U9811) CJ Swallow Discharge Status (B1478) (None) Motor Speech Current Status (G9562) (None) Motor Speech Goal Status (Z3086) (None)  Motor Speech Goal Status (V7846) (None) Spoken Language Comprehension Current Status (N6295) (None) Spoken Language Comprehension Goal Status (M8413) (None) Spoken Language Comprehension Discharge Status (K4401) (None) Spoken Language Expression Current Status (U2725) (None) Spoken Language Expression Goal Status (D6644) (None) Spoken Language Expression Discharge Status [phone]) (None) Attention Current Status (Q5956) (None) Attention Goal Status (L8756) (None) Attention Discharge Status (E3329) (None) Memory Current Status (J1884) (None) Memory Goal Status (Z6606) (None) Memory Discharge Status (T0160) (None) Voice Current Status (F0932) (None) Voice Goal Status (T5573) (None) Voice Discharge Status (U2025) (None) Other Speech-Language Pathology Functional Limitation Current Status (K2706) (None) Other Speech-Language Pathology Functional Limitation Goal Status (C3762) (None) Other Speech-Language Pathology Functional Limitation Discharge Status [phone]) (None) [name] 07/01/2016, 11:26 AM [name] [name].Ed CCC-SLP Pager [phone]              Dg Swallowing Func-speech Pathology  Result Date: 06/17/2016 Objective Swallowing Evaluation: Type of Study: MBS-Modified Barium Swallow Study Patient Details Name: ROVERTO BODMER MRN: [id number] Date of Birth: [birth year] Today's Date: 06/17/2016 Time: SLP Start Time (ACUTE ONLY): 1230-SLP Stop Time (ACUTE ONLY): 1300 SLP Time Calculation (min) (ACUTE ONLY): 30 min Past Medical History: Past Medical History: Diagnosis Date . Arthritis  . Barrett esophagus  . Bladder cancer ([place]) dx'd 5462,7035  surg only . Colon polyps   adenomatous . Difficulty sleeping   OCCASIONALLY . Diverticulosis of colon (without mention of hemorrhage)  . Frequency of urination  . GERD (gastroesophageal reflux disease)  . Hiatal hernia  . History of bladder cancer 1981  EXCISION ONLY . History of skin cancer   BASAL CELL . Hyperlipidemia  . Lymphoma, non-Hodgkin's ([place]) 2011  [place]  . Macular degeneration   LEFT EYE . Multiple nodules of lung 09/16/2011 . PAF (paroxysmal atrial fibrillation) ([place])  . Personal history of colonic polyps 07/15/2010  tubular adenoma . Prostate cancer [facility])   patient denies . PUD (peptic ulcer disease)  . Stomach cancer ([place])   NHL origin Past Surgical History: Past Surgical History: Procedure Laterality Date . APPENDECTOMY   . bone spur    left shoulder . CHOLECYSTECTOMY   . KNEE ARTHROPLASTY Left 07/30/2015  Procedure: LEFT TOTAL KNEE ARTHROPLASTY WITH COMPUTER NAVIGATION;  Surgeon: Rod Can, MD;  Location: [facility];  Service: Orthopedics;  Laterality: Left; . LASER OF PROSTATE W/ GREEN LIGHT PVP   . TENDON REPAIR    right arm . TRIGGER FINGER RELEASE    bilateral hands . WRIST SURGERY    Bilateral HPI: Rozell Theiler Wilsonis a 83 y.o.malewith medical history significant for Paroxysmal AFib, GERD, hiatal hernia, Barrett's esophagus, multiple lung nodules, bladder CA, non-Hodgkin's lymphoma, OA, hyperlipidemia who presented to the ED with c/o generalized weakness and fall. Chest x-ray showed possible  CHF with superimposed pneumonia in the middle left lung base. Head CT showed no acute abnormalities, small vessel ischemic disease white matter changes,and ethmoidal paranasal sinus disease. Barium esophagram poor esophageal motility. No stricture or mass. Barium tablet passed readily into the stomach. Negative for reflux. No Data Recorded Assessment / Plan / Recommendation CHL IP CLINICAL IMPRESSIONS 06/17/2016 Clinical Impression  Pt demonstrates a moderate oropharyngeal dysphagia with neuromuscular deficits impacting sensory and motor function. Pt observed to have bilateral ptosis, anterior spillage of secretions on the left (family reports history of left facial droop due to Bell's palsy) and weak cough strength upon arrival to exam. Despite deficits pt apepars conginitively intact and is able to follow complex commands.  Oral function chaacteraized by mild  lingual weakness observed via mild lingual residuals, with functional abiltiy to masticate and transit soft solids (though endurance not taxed). Flash penetration and silent aspriation during the swallow occured with boluses larger than 1ml (estimate) due to slow laryngeal closure and epiglottic deflection. Pt was able trigger a timely swallow and eventually achieve full laryngeal closure which expelled penetrate when possible, but again sluggish movement kept vestible partialy open during bolus passage. Further silent aspiration events also occured after the swallow if pt was cued to cough, which spilled valleuular residual into the airway. Attempted chin tuck without improvement and mendelsohn with visual biofeedback (pt unable after 5 attempts with max cues). Best methods included verbal cues for small sips (bolus size consistently comparable in appearance and fucntion with 61mL teaspoon), oral hold and effortful swallow x2. Given increased residuals with thicker textures, recommend pt initiate a dys 2/thin liquid diet. Recommend water only with oral care at least 3x day with toothbrush and paste. Pt will benefit from exercises to target hyolaryngeal excursion and base of tongue strength as well as EMST. Will f/u for education with family.  SLP Visit Diagnosis Dysphagia, oropharyngeal phase (R13.12) Attention and concentration deficit following -- Frontal lobe and executive function deficit following -- Impact on safety and function --   CHL IP TREATMENT RECOMMENDATION 06/17/2016 Treatment Recommendations Therapy as outlined in treatment plan below   Prognosis 06/17/2016 Prognosis for Safe Diet Advancement Good Barriers to Reach Goals -- Barriers/Prognosis Comment -- CHL IP DIET RECOMMENDATION 06/17/2016 SLP Diet Recommendations Dysphagia 2 (Fine chop) solids;Thin liquid Liquid Administration via Cup Medication Administration Whole meds with puree Compensations Slow rate;Small sips/bites;Effortful swallow;Minimize  environmental distractions;Clear throat intermittently Postural Changes Remain semi-upright after after feeds/meals (Comment)   CHL IP OTHER RECOMMENDATIONS 06/17/2016 Recommended Consults -- Oral Care Recommendations Oral care before and after PO Other Recommendations --   CHL IP FOLLOW UP RECOMMENDATIONS 06/17/2016 Follow up Recommendations 24 hour supervision/assistance   CHL IP FREQUENCY AND DURATION 06/17/2016 Speech Therapy Frequency (ACUTE ONLY) min 3x week Treatment Duration 2 weeks      CHL IP ORAL PHASE 06/17/2016 Oral Phase Impaired Oral - Pudding Teaspoon -- Oral - Pudding Cup -- Oral - Honey Teaspoon -- Oral - Honey Cup -- Oral - Nectar Teaspoon -- Oral - Nectar Cup -- Oral - Nectar Straw -- Oral - Thin Teaspoon -- Oral - Thin Cup Left anterior bolus loss;Lingual/palatal residue Oral - Thin Straw -- Oral - Puree WFL Oral - Mech Soft WFL Oral - Regular -- Oral - Multi-Consistency -- Oral - Pill -- Oral Phase - Comment --  CHL IP PHARYNGEAL PHASE 06/17/2016 Pharyngeal Phase Impaired Pharyngeal- Pudding Teaspoon -- Pharyngeal -- Pharyngeal- Pudding Cup -- Pharyngeal -- Pharyngeal- Honey Teaspoon -- Pharyngeal -- Pharyngeal- Honey Cup --  Pharyngeal -- Pharyngeal- Nectar Teaspoon -- Pharyngeal -- Pharyngeal- Nectar Cup -- Pharyngeal -- Pharyngeal- Nectar Straw -- Pharyngeal -- Pharyngeal- Thin Teaspoon -- Pharyngeal -- Pharyngeal- Thin Cup Reduced anterior laryngeal mobility;Penetration/Aspiration during swallow;Reduced tongue base retraction;Penetration/Apiration after swallow;Moderate aspiration;Trace aspiration;Pharyngeal residue - valleculae;Compensatory strategies attempted (with notebox) Pharyngeal Material enters airway, passes BELOW cords without attempt by patient to eject out (silent aspiration);Material enters airway, CONTACTS cords and then ejected out;Material does not enter airway Pharyngeal- Thin Straw -- Pharyngeal -- Pharyngeal- Puree Reduced anterior laryngeal mobility;Reduced tongue base  retraction;Pharyngeal residue - valleculae;Compensatory strategies attempted (with notebox) Pharyngeal -- Pharyngeal- Mechanical Soft Reduced anterior laryngeal mobility;Reduced tongue base retraction;Pharyngeal residue - valleculae;Compensatory strategies attempted (with notebox) Pharyngeal -- Pharyngeal- Regular -- Pharyngeal -- Pharyngeal- Multi-consistency -- Pharyngeal -- Pharyngeal- Pill -- Pharyngeal -- Pharyngeal Comment --  No flowsheet data found. CHL IP GO 06/16/2016 Functional Assessment Tool Used skilled clinical judgement Functional Limitations Swallowing Swallow Current Status [phone]) CK Swallow Goal Status (B1517) [place] Swallow Discharge Status [phone]) (None) Motor Speech Current Status [phone]) (None) Motor Speech Goal Status (Y6948) (None) Motor Speech Goal Status (N4627) (None) Spoken Language Comprehension Current Status (O3500) (None) Spoken Language Comprehension Goal Status (X3818) (None) Spoken Language Comprehension Discharge Status [phone]) (None) Spoken Language Expression Current Status [phone]) (None) Spoken Language Expression Goal Status [phone]) (None) Spoken Language Expression Discharge Status [phone]) (None) Attention Current Status (W2585) (None) Attention Goal Status (I7782) (None) Attention Discharge Status [phone]) (None) Memory Current Status (I1443) (None) Memory Goal Status (X5400) (None) Memory Discharge Status (Q6761) (None) Voice Current Status (P5093) (None) Voice Goal Status (O6712) (None) Voice Discharge Status (W5809) (None) Other Speech-Language Pathology Functional Limitation Current Status (X8338) (None) Other Speech-Language Pathology Functional Limitation Goal Status (S5053) (None) Other Speech-Language Pathology Functional Limitation Discharge Status [phone]) (None) [place], Katherene Ponto 06/17/2016, 2:21 PM              Ct Head Code Stroke W/o Cm  Addendum Date: 06/30/2016   ADDENDUM REPORT: 06/30/2016 16:06 ADDENDUM: Study discussed by telephone on 06/30/2016 with  Dr. Harrell Gave Tegeler, at 1534 hours. Electronically Signed   By: Genevie Ann M.D.   On: 06/30/2016 16:06   Result Date: 06/30/2016 CLINICAL DATA:  Code stroke. 83 year old male with episode of right leg weakness slurred speech and right facial droop which lasted about 1 hour today. Facial droop persists but other symptoms have resolved. Initial encounter. EXAM: CT HEAD WITHOUT CONTRAST TECHNIQUE: Contiguous axial images were obtained from the base of the skull through the vertex without intravenous contrast. COMPARISON:  Brain MRI 06/16/2016 and earlier. FINDINGS: Brain: Stable cerebral volume. No midline shift, mass effect, or evidence of intracranial mass lesion. No ventriculomegaly. No acute intracranial hemorrhage identified. Stable gray-white matter differentiation throughout the brain. Chronic white matter hypodensity appears stable. No cortically based acute infarct identified. Vascular: Calcified atherosclerosis at the skull base. No suspicious intracranial vascular hyperdensity. Skull: No acute osseous abnormality identified. Sinuses/Orbits: Visualized paranasal sinuses and mastoids are stable and well pneumatized. Other: No acute orbit or scalp soft tissue findings. ASPECTS ([place] Stroke Program Early CT Score) - Ganglionic level infarction (caudate, lentiform nuclei, internal capsule, insula, M1-M3 cortex): 7 - Supraganglionic infarction (M4-M6 cortex): 3 Total score (0-10 with 10 being normal): 10 IMPRESSION: 1. Stable non contrast CT appearance of the brain, no acute intracranial abnormality. 2. ASPECTS is 10. 3. Chronic white matter changes. Electronically Signed: By: Genevie Ann M.D. On: 06/30/2016 15:24     CBC  Recent Labs Lab 06/30/16 1524 06/30/16 1530 07/02/16  0403  WBC 8.2  --  6.2  HGB 12.1* 12.9* 11.9*  HCT 38.3* 38.0* 37.4*  PLT 194  --  160  MCV 85.5  --  84.6  MCH 27.0  --  26.9  MCHC 31.6  --  31.8  RDW 13.5  --  13.4  LYMPHSABS 1.7  --   --   MONOABS 0.5  --   --    EOSABS 0.1  --   --   BASOSABS 0.0  --   --     Chemistries   Recent Labs Lab 06/30/16 1524 06/30/16 1530 07/03/16 0346  NA 140 142 138  K 3.9 3.8 3.3*  CL 106 105 109  CO2 25  --  21*  GLUCOSE 103* 98 129*  BUN 28* 30* 26*  CREATININE 1.21 1.10 1.06  CALCIUM 9.1  --  8.5*  AST 23  --   --   ALT 12*  --   --   ALKPHOS 30*  --   --   BILITOT 0.8  --   --    ------------------------------------------------------------------------------------------------------------------ estimated creatinine clearance is 59.4 mL/min (by C-G formula based on SCr of 1.06 mg/dL). ------------------------------------------------------------------------------------------------------------------  Recent Labs  07/01/16 0746  HGBA1C 5.5   ------------------------------------------------------------------------------------------------------------------  Recent Labs  07/01/16 0746  CHOL 123  HDL 45  LDLCALC 68  TRIG 52  CHOLHDL 2.7   ------------------------------------------------------------------------------------------------------------------ No results for input(s): TSH, T4TOTAL, T3FREE, THYROIDAB in the last 72 hours.  Invalid input(s): FREET3 ------------------------------------------------------------------------------------------------------------------  Recent Labs  07/01/16 0746  VITAMINB12 1,101*    Coagulation profile  Recent Labs Lab 06/30/16 1524  INR 1.58    No results for input(s): DDIMER in the last 72 hours.  Cardiac Enzymes No results for input(s): CKMB, TROPONINI, MYOGLOBIN in the last 168 hours.  Invalid input(s): CK ------------------------------------------------------------------------------------------------------------------ Invalid input(s): POCBNP   CBG:  Recent Labs Lab 06/30/16 2146 07/02/16 2112 07/03/16 0439  GLUCAP 90 107* 129*       Studies: Ct Chest W Contrast  Result Date: 07/01/2016 CLINICAL DATA:  Bladder cancer,  non-Hodgkin's lymphoma and Barrett's esophagus. EXAM: CT CHEST, ABDOMEN, AND PELVIS WITH CONTRAST TECHNIQUE: Multidetector CT imaging of the chest, abdomen and pelvis was performed following the standard protocol during bolus administration of intravenous contrast. CONTRAST:  111mL ISOVUE-300 IOPAMIDOL (ISOVUE-300) INJECTION 61% COMPARISON:  CXR from 06/30/2016, chest CT from 09/09/2011, CT chest, abdomen and pelvis from 09/09/2011 FINDINGS: CT CHEST FINDINGS Cardiovascular: Undo top normal size cardiac chambers without pericardial effusion. No large central pulmonary embolus. Aortic atherosclerosis without aneurysm. No dissection. Mild ectasia of the ascending aorta as before up to 3.9 cm. Normal branch pattern for the great vessels. Mediastinum/Nodes: Stable subcentimeter left-sided thyroid nodules since 2013 consistent with benign findings. The largest currently is approximately 12 mm. Fat containing right lower paratracheal lymph node measuring up to 13 mm is without significant change. Stable hilar lymph nodes, the largest on the right approximately 9 mm short axis and on the left, 8 mm short axis. Lungs/Pleura: Stable calcified and noncalcified subcentimeter pulmonary nodules dating back to 2013 consistent with benign findings, index nodule the left upper lobe measuring 7 mm, series 5, image 76. Bibasilar dependent atelectasis. There is lingular, right middle lobe and both lower lobe bronchiectasis with mild peribronchial thickening and chronic minimal parenchymal scarring and/or atelectasis. Musculoskeletal: Lower thoracic kyphosis attributable to multilevel degenerative disc disease. Small osteophytes are noted along the anterior aspect of the mid and lower thoracic spine. No acute nor suspicious osseous  abnormalities. CT ABDOMEN PELVIS FINDINGS Hepatobiliary: Status post cholecystectomy. No space-occupying mass of the liver. No biliary dilatation. Pancreas: Unremarkable. No pancreatic ductal dilatation or  surrounding inflammatory changes. Spleen: Normal in size without focal abnormality. Adrenals/Urinary Tract: Adrenal glands are unremarkable. Kidneys are normal, without renal calculi, focal lesion, or hydronephrosis. Bladder is unremarkable. Stomach/Bowel: Stomach is within normal limits. Appendix is surgically absent by report. No evidence of bowel wall thickening, distention, or inflammatory changes. Vascular/Lymphatic: Bandlike mesenteric soft tissue attenuation with calcification in the mid abdomen is slightly more prominent than on prior consistent with treated lymphoma. Small scattered mesenteric lymph nodes are seen in the right lower quadrant and root of the mesentery as before. No inguinal or pelvic sidewall adenopathy. No retroperitoneal or mesenteric lymphadenopathy. No porta hepatic nor epigastric enlargement of lymph nodes Reproductive: Prostate is unremarkable. Other: No abdominal wall hernia or abnormality. No abdominopelvic ascites. Musculoskeletal: No acute or significant osseous findings. Degenerative disc disease L4-5. IMPRESSION: 1. Chronic since 2013 are stable pulmonary nodules noted bilaterally, index nodule the left upper lobe measures 7 mm consistent with benign findings. 2. Hazy mesenteric fat in the mid abdomen with calcification consistent with treated non-Hodgkin's lymphoma. No recurrence of lymphadenopathy identified within chest, abdomen or pelvis. 3. Cholecystectomy. Electronically Signed   By: Ashley Royalty M.D.   On: 07/01/2016 20:12   Ct Abdomen Pelvis W Contrast  Result Date: 07/01/2016 CLINICAL DATA:  Bladder cancer, non-Hodgkin's lymphoma and Barrett's esophagus. EXAM: CT CHEST, ABDOMEN, AND PELVIS WITH CONTRAST TECHNIQUE: Multidetector CT imaging of the chest, abdomen and pelvis was performed following the standard protocol during bolus administration of intravenous contrast. CONTRAST:  128mL ISOVUE-300 IOPAMIDOL (ISOVUE-300) INJECTION 61% COMPARISON:  CXR from 06/30/2016,  chest CT from 09/09/2011, CT chest, abdomen and pelvis from 09/09/2011 FINDINGS: CT CHEST FINDINGS Cardiovascular: Undo top normal size cardiac chambers without pericardial effusion. No large central pulmonary embolus. Aortic atherosclerosis without aneurysm. No dissection. Mild ectasia of the ascending aorta as before up to 3.9 cm. Normal branch pattern for the great vessels. Mediastinum/Nodes: Stable subcentimeter left-sided thyroid nodules since 2013 consistent with benign findings. The largest currently is approximately 12 mm. Fat containing right lower paratracheal lymph node measuring up to 13 mm is without significant change. Stable hilar lymph nodes, the largest on the right approximately 9 mm short axis and on the left, 8 mm short axis. Lungs/Pleura: Stable calcified and noncalcified subcentimeter pulmonary nodules dating back to 2013 consistent with benign findings, index nodule the left upper lobe measuring 7 mm, series 5, image 76. Bibasilar dependent atelectasis. There is lingular, right middle lobe and both lower lobe bronchiectasis with mild peribronchial thickening and chronic minimal parenchymal scarring and/or atelectasis. Musculoskeletal: Lower thoracic kyphosis attributable to multilevel degenerative disc disease. Small osteophytes are noted along the anterior aspect of the mid and lower thoracic spine. No acute nor suspicious osseous abnormalities. CT ABDOMEN PELVIS FINDINGS Hepatobiliary: Status post cholecystectomy. No space-occupying mass of the liver. No biliary dilatation. Pancreas: Unremarkable. No pancreatic ductal dilatation or surrounding inflammatory changes. Spleen: Normal in size without focal abnormality. Adrenals/Urinary Tract: Adrenal glands are unremarkable. Kidneys are normal, without renal calculi, focal lesion, or hydronephrosis. Bladder is unremarkable. Stomach/Bowel: Stomach is within normal limits. Appendix is surgically absent by report. No evidence of bowel wall  thickening, distention, or inflammatory changes. Vascular/Lymphatic: Bandlike mesenteric soft tissue attenuation with calcification in the mid abdomen is slightly more prominent than on prior consistent with treated lymphoma. Small scattered mesenteric lymph nodes are seen in the right  lower quadrant and root of the mesentery as before. No inguinal or pelvic sidewall adenopathy. No retroperitoneal or mesenteric lymphadenopathy. No porta hepatic nor epigastric enlargement of lymph nodes Reproductive: Prostate is unremarkable. Other: No abdominal wall hernia or abnormality. No abdominopelvic ascites. Musculoskeletal: No acute or significant osseous findings. Degenerative disc disease L4-5. IMPRESSION: 1. Chronic since 2013 are stable pulmonary nodules noted bilaterally, index nodule the left upper lobe measures 7 mm consistent with benign findings. 2. Hazy mesenteric fat in the mid abdomen with calcification consistent with treated non-Hodgkin's lymphoma. No recurrence of lymphadenopathy identified within chest, abdomen or pelvis. 3. Cholecystectomy. Electronically Signed   By: Ashley Royalty M.D.   On: 07/01/2016 20:12   Dg Abd Portable 1v  Result Date: 07/02/2016 CLINICAL DATA:  Feeding tube placement. EXAM: PORTABLE ABDOMEN - 1 VIEW COMPARISON:  Abdomen and pelvis CT dated 07/01/2016. FINDINGS: Feeding tube tip in the mid stomach. Normal bowel gas pattern. Oral contrast in the right colon. Cholecystectomy clips. Thoracolumbar spine degenerative changes. IMPRESSION: Feeding tube tip in the mid stomach. Electronically Signed   By: Claudie Revering M.D.   On: 07/02/2016 15:57      Lab Results  Component Value Date   HGBA1C 5.5 07/01/2016   Lab Results  Component Value Date   LDLCALC 68 07/01/2016   CREATININE 1.06 07/03/2016       Scheduled Meds: . enoxaparin (LOVENOX) injection  1 mg/kg Subcutaneous Q12H  . free water  200 mL Per Tube Q6H  . guaiFENesin  600 mg Oral BID  . pantoprazole  40 mg Oral  BID  . simvastatin  40 mg Oral QHS  . tamsulosin  0.4 mg Oral QHS   Continuous Infusions: . sodium chloride 75 mL/hr at 07/02/16 1639  . feeding supplement (JEVITY 1.2 CAL) 1,000 mL (07/02/16 2000)     LOS: 2 days    Time spent: >30 MINS    [facility]  [facility] Pager [phone]. If 7PM-7AM, please contact night-coverage at [email or web address], password [facility] 07/03/2016, 10:06 AM  LOS: 2 days

## 2016-07-04 ENCOUNTER — Ambulatory Visit: Payer: Medicare Other | Admitting: Neurology

## 2016-07-04 LAB — GLUCOSE, CAPILLARY
GLUCOSE-CAPILLARY: 116 mg/dL — AB (ref 65–99)
GLUCOSE-CAPILLARY: 107 mg/dL — AB (ref 65–99)
Glucose-Capillary: 119 mg/dL — ABNORMAL HIGH (ref 65–99)
Glucose-Capillary: 100 mg/dL — ABNORMAL HIGH (ref 65–99)
Glucose-Capillary: 95 mg/dL (ref 65–99)

## 2016-07-04 MED ORDER — SODIUM CHLORIDE 0.9 % IV SOLN
30.0000 meq | Freq: Once | INTRAVENOUS | Status: AC
  Administered 2016-07-04: 30 meq via INTRAVENOUS
  Filled 2016-07-04: qty 15

## 2016-07-04 NOTE — Evaluation (Signed)
Speech Language Pathology Evaluation Patient Details Name: Jason [name] MRN: [id number] DOB: 12/21/1927 Today's Date: 07/04/2016 Time: 4401-0272 SLP Time Calculation (min) (ACUTE ONLY): 36 min  Problem List:  Patient Active Problem List   Diagnosis Date Noted  . CVA (cerebral vascular accident) ([place]) 07/01/2016  . Dysarthria   . History of cancer   . TIA (transient ischemic attack) 06/30/2016  . Hemiplegia affecting dominant side ([place]) 06/30/2016  . History of bladder cancer   . Dysphagia   . Hyperglycemia   . Stage 3 chronic kidney disease   . Acute blood loss anemia   . Leukocytosis   . Weakness   . Community acquired pneumonia 06/16/2016  . Unresponsiveness   . CAP (community acquired pneumonia) 06/15/2016  . PAF (paroxysmal atrial fibrillation) ([place]) 06/15/2016  . Pressure injury of skin 06/15/2016  . Lobar pneumonia ([place])   . Acute congestive heart failure ([place])   . Primary osteoarthritis of left knee 07/30/2015  . BPH (benign prostatic hyperplasia) 07/16/2012  . Multiple nodules of lung 09/16/2011  . GERD (gastroesophageal reflux disease) 06/14/2011  . Constipation, chronic 06/14/2011  . NHL (non-Hodgkin's lymphoma) ([place]) 01/03/2010  . Malignant neoplasm of bladder ([place]) 05/28/2007  . Hyperlipidemia 05/28/2007  . HIATAL HERNIA 05/28/2007  . BARRETT'S ESOPHAGUS, HX OF 05/28/2007   Past Medical History:  Past Medical History:  Diagnosis Date  . Arthritis   . Barrett esophagus   . Bladder cancer ([place]) dx'd 5366,4403   surg only  . Colon polyps    adenomatous  . Difficulty sleeping    OCCASIONALLY  . Diverticulosis of colon (without mention of hemorrhage)   . Frequency of urination   . GERD (gastroesophageal reflux disease)   . Hiatal hernia   . History of bladder cancer 1981   EXCISION ONLY  . History of skin cancer    BASAL CELL  . Hyperlipidemia   . Lymphoma, non-Hodgkin's ([place]) 2011   [place]   . Macular degeneration    LEFT EYE  . Multiple  nodules of lung 09/16/2011  . PAF (paroxysmal atrial fibrillation) ([place])   . Personal history of colonic polyps 07/15/2010   tubular adenoma  . Prostate cancer [facility])    patient denies  . PUD (peptic ulcer disease)   . Stomach cancer ([place])    NHL origin   Past Surgical History:  Past Surgical History:  Procedure Laterality Date  . APPENDECTOMY    . bone spur     left shoulder  . CHOLECYSTECTOMY    . KNEE ARTHROPLASTY Left 07/30/2015   Procedure: LEFT TOTAL KNEE ARTHROPLASTY WITH COMPUTER NAVIGATION;  Surgeon: Rod Can, MD;  Location: [facility] ORS;  Service: Orthopedics;  Laterality: Left;  . LASER OF PROSTATE W/ GREEN LIGHT PVP    . TENDON REPAIR     right arm  . TRIGGER FINGER RELEASE     bilateral hands  . WRIST SURGERY     Bilateral   HPI:  Ptis an 83 y.o. malewith PMH significant of PAF, Barrett's esophagus, bladder cancer, non-Hodgkin's lymphoma, OA, andHLD; who presents with acute onset of slurred speech, right-sided facial droop, and right sided upper and lower extremity weakness. He had just been recently hospitalized from 3/14 -3/19 for community-acquired pneumonia. Due to swallowing difficulties patient was evaluated by speech therapy and seen to be silently aspirating. Patient was placed on modified diet (recommended Dys 3 solids, thin liquids). Upon discharge he was sent to a skilled nursing facilityfor rehabilitation. MRI of brain today  showed acute infarction affecting the left para median pons. No hemorrhage. Chronic small-vessel ischemic changes elsewhere. CXR showed some improvement in the appearance of the lungs especially upper lobes but coarse interstitial markings persist in the lower lobes. There is no discrete pneumonia. There is stablecardiomegaly without pulmonary vascular congestion.Thoracic aortic atherosclerosis.   Assessment / Plan / Recommendation Clinical Impression  [name] was alert and oriented X4. He and his daughters reported a reduction in his  speech intelligibility, visual impairments, and fine motor deficits (writing). He stated that he lives alone, his daughters handle his finances, and he usually does chores around the house. Parts of the Jason [name] were administered to assess [name] language and cognitive abilities. He has deficits in the visuospatial domain and naming likely due to baseline macular degeneration. Testing also revealed deficits in memory/delayed recall, abstraction/similarities, and a very mild impairment in repetition. He was not able to recall any of the 5 words independently, although he was able to accurately recall 2/5 words when provided with a category cue and 1/5 words when given a multiple choice cue. His speech intelligibility is significantly reduced and he would benefit from further speech therapy. Educated pt re: results of evaluation and reviewed speech strategies (slow rate, increase volume, overarticulate, pause between words). Pt is functional for cognitive demands and does not need further treatment for language/cognition, however ST will treat patient for speech deficits.     SLP Assessment  SLP Recommendation/Assessment: Patient needs continued Speech Lanaguage Pathology Services SLP Visit Diagnosis: Dysarthria and anarthria (R47.1);Cognitive communication deficit (R41.841)    Follow Up Recommendations  Skilled Nursing facility    Frequency and Duration min 2x/week  2 weeks      SLP Evaluation Cognition  Overall Cognitive Status: Impaired/Different from baseline Arousal/Alertness: Awake/alert Orientation Level: Oriented X4 Attention: Sustained Sustained Attention: Appears intact Memory: Impaired Memory Impairment: Storage deficit;Retrieval deficit;Decreased short term memory Decreased Short Term Memory: Verbal basic Awareness: Appears intact Problem Solving: Appears intact Safety/Judgment: Appears intact       Comprehension  Auditory Comprehension Overall Auditory Comprehension:  Appears within functional limits for tasks assessed Yes/No Questions: Not tested Commands: Not tested Conversation: Complex Visual Recognition/Discrimination Discrimination: Not tested Reading Comprehension Reading Status: Not tested    Expression Expression Primary Mode of Expression: Verbal Verbal Expression Overall Verbal Expression: Impaired Initiation: No impairment Level of Generative/Spontaneous Verbalization: Conversation Repetition: Impaired Level of Impairment: Sentence level Naming: Impairment (due to visual ?) Pragmatics: No impairment Written Expression Dominant Hand: Right Written Expression: Not tested   Oral / Motor  Oral Motor/Sensory Function Overall Oral Motor/Sensory Function: Within functional limits Motor Speech Overall Motor Speech: Impaired Respiration: Within functional limits Phonation: Low vocal intensity Resonance: Within functional limits Articulation: Impaired Level of Impairment: Phrase Intelligibility: Intelligibility reduced Word: 50-74% accurate Phrase: 25-49% accurate Sentence: 25-49% accurate Conversation: 0-24% accurate Motor Planning: Witnin functional limits Motor Speech Errors: Not applicable   GO                    [employer] , Student-SLP 07/04/2016, 3:19 PM

## 2016-07-04 NOTE — Progress Notes (Signed)
Occupational Therapy Treatment Patient Details Name: Jason [name] MRN: [id number] DOB: [birth year] Today's Date: 07/04/2016    History of present illness Jason [name] is a 83 y.o. male with medical history significant for Paroxysmal AFib on eliquis, GERD, Barrett's esophagus, bladder CA, non-Hodgkin's lymphoma, OA, hyperlipidemia who presented in ED on 06/30/16 with acute onset of slurred speech, right-sided facial droop, and right sided upper and lower extremity weakness. He had just been recently hospitalized from 3/14 -3/19 for community-acquired pneumonia. Upon discharge he was sent to a skilled nursing facility for rehabilitation. 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 No hemorrhage.   OT comments  Pt progressing toward OT goals. He demonstrated improved functional use of L visual field this session and was able to identify items to the L on his tray table. Able to stand at sink for oral care tasks with mod assist for sit<>stand and min assist for grooming task. While standing, pt had episode of urinary incontinence and was able to complete toileting hygiene with moderate assist, UB dressing with min assist, and LB bathing tasks with mod assist. Pt  required increased time for processing and following commands this session. Updated D/C recommendation to SNF in order to improve independence with ADL as pt will better tolerate this level of rehabilitation. OT will continue to follow acutely.   Follow Up Recommendations  SNF    Equipment Recommendations  Other (comment) (TBD )    Recommendations for Other Services      Precautions / Restrictions Precautions Precautions: Fall Restrictions Weight Bearing Restrictions: No       Mobility Bed Mobility Overal bed mobility: Needs Assistance Bed Mobility: Supine to Sit     Supine to sit: Mod assist;HOB elevated     General bed mobility comments: OOB in chair on arrival.  Transfers Overall  transfer level: Needs assistance Equipment used: Rolling walker (2 wheeled) Transfers: Sit to/from Stand Sit to Stand: Mod assist         General transfer comment: Pt will need +2 assist for stand-pivot. With fatigue unable to pivot this session.    Balance Overall balance assessment: Needs assistance Sitting-balance support: Feet supported;No upper extremity supported Sitting balance-Leahy Scale: Fair     Standing balance support: Bilateral upper extremity supported Standing balance-Leahy Scale: Poor Standing balance comment: Reliant on at least single UE support during standing.                           ADL either performed or assessed with clinical judgement   ADL Overall ADL's : Needs assistance/impaired Eating/Feeding: NPO   Grooming: Oral care;Sitting;Minimal assistance       Lower Body Bathing: Moderate assistance;Sit to/from stand   Upper Body Dressing : Minimal assistance;Sitting   Lower Body Dressing: Moderate assistance;Sit to/from Health and safety inspector Details (indicate cue type and reason): Attempted stand-pivot with RW but pt unable to assume full standing position. While standing at sink, pt urinated and attempted to quickly transfer to [facility] but had finished by the time able to get [facility] ready. Toileting- Water quality scientist and Hygiene: Sit to/from stand;Moderate assistance         General ADL Comments: Pt able to complete dressing and bathing tasks as well as toileting hygiene following incident of urinating on floor.     Vision   Additional Comments: Able to identify items at midline and in L visual field.   Perception  Praxis      Cognition Arousal/Alertness: Awake/alert Behavior During Therapy: WFL for tasks assessed/performed Overall Cognitive Status: Impaired/Different from baseline Area of Impairment: Following commands;Safety/judgement;Awareness                   Current Attention Level: Sustained    Following Commands: Follows one step commands with increased time Safety/Judgement: Decreased awareness of safety;Decreased awareness of deficits Awareness: Emergent            Exercises General Exercises - Lower Extremity Ankle Circles/Pumps: AROM;Both;20 reps;Seated Long Arc Quad: AROM;Strengthening;Both;10 reps;Seated Hip Flexion/Marching: AROM;Strengthening;Both;10 reps;Seated   Shoulder Instructions       General Comments      Pertinent Vitals/ Pain       Pain Assessment: No/denies pain Faces Pain Scale: Hurts a little bit Pain Intervention(s): Monitored during session  Home Living                                      Lives With: Alone    Prior Functioning/Environment              Frequency  Min 3X/week        Progress Toward Goals  OT Goals(current goals can now be found in the care plan section)  Progress towards OT goals: Progressing toward goals  Acute Rehab OT Goals Patient Stated Goal: To get better OT Goal Formulation: With patient Time For Goal Achievement: 07/15/16 Potential to Achieve Goals: Good  Plan Discharge plan needs to be updated    Co-evaluation                 End of Session Equipment Utilized During Treatment: Gait belt;Rolling walker  OT Visit Diagnosis: Unsteadiness on feet (R26.81);Muscle weakness (generalized) (M62.81);Other symptoms and signs involving the nervous system (R29.898);Other symptoms and signs involving cognitive function   Activity Tolerance Patient tolerated treatment well   Patient Left in chair;with chair alarm set;with family/visitor present   Nurse Communication Mobility status        Time: 9643-8381 OT Time Calculation (min): 46 min  Charges: OT General Charges $OT Visit: 1 Procedure OT Treatments $Self Care/Home Management : 38-52 mins  Norman Herrlich, MS OTR/L  Pager: [place] A Yaquelin Langelier 07/04/2016, 5:17 PM

## 2016-07-04 NOTE — Progress Notes (Addendum)
[facility] PROGRESS NOTE  Jason [name] JHE:[id number] DOB: March 15, 1928 DOA: 06/30/2016   PCP: Melinda Crutch, MD     Assessment/Plan: Principal Problem:   Hemiplegia affecting dominant side ([place]) Active Problems:   Hyperlipidemia   GERD (gastroesophageal reflux disease)   BPH (benign prostatic hyperplasia)   PAF (paroxysmal atrial fibrillation) (H[facility])   Dysphagia   CVA (cerebral vascular accident) ([place])   Dysarthria   History of cancer   83 y.o. right handed male with medical history significant of PAF on Eliquis, Barrett's esophagus, bladder cancer, non-Hodgkin's lymphoma, OA, and HLD;  who presents with acute onset of slurred speech, right-sided facial droop, and right sided upper and lower extremity weakness. Patient's daughter helps provide history and states that when she went to visit the patient at the rehabilitation facility she noticed that his speech was significantly slurred to the point where she was unable to understand him.He had just been recently hospitalized from 3/14 -3/19 for community-acquired pneumonia. During hospitalization he had an event of unresponsiveness with bradycardia, but cardiology felt it was noncardiac in nature. Patient had negative MRI, MRA, and EEG studies. Due to swallowing difficulties patient was evaluated by speech therapy and seen to be silently aspirating.Upon discharge he was sent to a skilled nursing facility for rehabilitation and completed a full course of Levaquin on 3/21.Neurology was called and patient was brought for a emergent CT perfusion to evaluate for M1 stenosis or occlusion.   Assessment/Plan  Stroke:   L paramedian pontine infarct secondary to small vessel disease source. However, pt has pAfib on eliquis    CTA head/neck no ELVO. Atherosclerosis. Multilevel spondylosis  CT Perfusion negative. No acute infarct  MRI  acute L paramedian pontine infarct. small vessel disease.   LE venous doppler 06/15/16 NO DVT. R&L  enlarged lymph nodes  2D Echo  06/15/16 severe LVH. EF 55-60%. No valve abnormalities  Pan CT negative for malignancy,No recurrence of lymphadenopathy identified within chest, abdomen or pelvis  LDL 68 ,Statin via  cor trak tube    HgbA1c 5.5  UDS negative  lovenox therapeutic dose ,  Npo as per speech therapy, currently has NG tube which he pulled out, may need to consider PEG tube,IR requested to evaluate eligibility for PEG ,since he may need it in a few days .We will replace his NGT it in am   Eliquis (apixaban) daily prior to admission, now on lovenox therapeutic dose due to NPO status.       Once passes swallow or have po established, can switch back to eliquis.            Therapy recommendations: SNF [name]           Pt has follow up appointment with Dr. [name] at [facility] on 07/07/16   Physical deconditioning, drooling and swallowing difficulty - ? Neuromuscular disorder vs. ? Hx of stroke Admitted 2 weeks ago and concerning for neuromuscular disease MG panel negative  Recommended outpt EMG/NCS Has appointment with Dr. [name] at [facility] on 07/07/16 Pt had left drooling intermittently for the last 2 years Pt has swallow difficulty for 6 months CT and MRI showed remote right IC genu infarct and right thalamic punctate infarcts     Previous diet recommendations from hospitalization on 3/14:  Previously on Dysphagia 3 (mechanical soft);Thin liquid Currently nothing by mouth    Recent community-acquired pneumonia: Chest x-ray overall improved. - Continue Mucinex, completed Levaquin  Paroxysmal atrial fibrillation: Chadsvasc score = 5 -  Continue Eliquis when by mouth intake established  Hyperlipidemia - Continue simvastatin  BPH - Continue Flomax    Abnormal UA Urine culture insignificant  Normocytic normochromic anemia: Stable - Continue to monitor   GERD with H/O Barrett's esophagus - Pharmacy substitution of Protonix for Nexium   obesity, Body mass index  is 31.75 kg/m., recommend weight loss, diet and exercise as appropriate    DVT prophylaxsis Lovenox >eliquis  Code Status:   Full code   Family Communication: Discussed in detail with the patient, all imaging results, lab results explained to the patient   Disposition Plan:   Discharge barriers primarily his dysphagia. Needs  PEG tube by the end of this week if no improvement by speech therapy     Consultants: Neurology  Procedures: None  Antibiotics: Anti-infectives    None         HPI/Subjective:  right UE and LE weakness yesterday but much improved now    Objective: Vitals:   07/03/16 2101 07/04/16 0115 07/04/16 0602 07/04/16 0949  BP: (!) 160/66 (!) 149/66 (!) 148/64 (!) 147/66  Pulse: 66 (!) 58 (!) 58 61  Resp: 20 20 18 20   Temp: 98.2 F (36.8 C) 98.2 F (36.8 C) 98.2 F (36.8 C)   TempSrc: Oral Oral Oral   SpO2: 98% 96% 97% 98%  Weight:      Height:        Intake/Output Summary (Last 24 hours) at 07/04/16 1303 Last data filed at 07/04/16 0600  Gross per 24 hour  Intake                0 ml  Output              850 ml  Net             -850 ml    Exam:  Examination:  General exam: Appears calm and comfortable  Respiratory system: Clear to auscultation. Respiratory effort normal. Cardiovascular system: S1 & S2 heard, RRR. No JVD, murmurs, rubs, gallops or clicks. No pedal edema. Gastrointestinal system: Abdomen is nondistended, soft and nontender. No organomegaly or masses felt. Normal bowel sounds heard. Central nervous system:   dysarthria,right nasolabial fold flattening Extremities: Symmetric 5 x 5 power. Skin: No rashes, lesions or ulcers Psychiatry: Judgement and insight appear normal. Mood & affect appropriate.     Data Reviewed: I have personally reviewed following labs and imaging studies  Micro Results Recent Results (from the past 240 hour(s))  Culture, Urine     Status: Abnormal   Collection Time: 06/30/16  5:30 PM  Result  Value Ref Range Status   Specimen Description URINE, RANDOM  Final   Special Requests NONE  Final   Culture <10,000 COLONIES/mL INSIGNIFICANT GROWTH (A)  Final   Report Status 07/02/2016 FINAL  Final    Radiology Reports Ct Angio Head W Or Wo Contrast  Result Date: 06/30/2016 CLINICAL DATA:  New onset right-sided facial droop and intermittent dysarthria over the last 2 days. EXAM: CT ANGIOGRAPHY HEAD AND NECK TECHNIQUE: Multidetector CT imaging of the head and neck was performed using the standard protocol during bolus administration of intravenous contrast. Multiplanar CT image reconstructions and MIPs were obtained to evaluate the vascular anatomy. Carotid stenosis measurements (when applicable) are obtained utilizing NASCET criteria, using the distal internal carotid diameter as the denominator. CONTRAST:  50 mL Isovue 370 COMPARISON:  CT head without contrast from the same day. MRI brain 06/16/2016 FINDINGS: CTA NECK FINDINGS Aortic arch: A  3 vessel arch configuration is present. Atherosclerotic changes are present at the aortic arch without significant stenosis at the great vessel origins. There is no aneurysm. Right carotid system: Common carotid artery is tortuous proximally. There is no focal stenosis. Atherosclerotic calcifications are present at the right carotid bifurcation without significant stenosis relative to the more distal vessel. Remainder of the right internal carotid artery is normal. Left carotid system: The left common carotid artery is tortuous proximally. There is no significant stenosis. Atherosclerotic changes are present at the carotid bifurcation without significant stenosis. The more distal left internal carotid artery is normal. Vertebral arteries: Both vertebral arteries originate from the subclavian arteries without significant stenoses. The vertebral arteries are codominant. There is no significant stenosis or vascular injury to either vertebral artery in the neck.  Skeleton: Mild endplate degenerative and facet changes are present. No focal lytic or blastic lesions are present. There is exaggerated thoracic kyphosis and cervical lordosis. Other neck: No focal mucosal or submucosal lesions are present. Bilateral thyroid nodules are present without a dominant lesion. There is no significant adenopathy. The salivary glands are within normal limits. Upper chest: The lung apices demonstrate bilateral dependent atelectasis. No focal nodule or mass lesion is present. Review of the MIP images confirms the above findings CTA HEAD FINDINGS Anterior circulation: Atherosclerotic calcifications are present within the cavernous internal carotid arteries bilaterally without a significant stenosis through the ICA terminus. The A1 and M1 segments are normal. The anterior communicating artery is patent. The MCA bifurcations are intact. There is segmental attenuation of ACA and MCA branch vessels bilaterally without a significant proximal stenosis or occlusion. Posterior circulation: The vertebral arteries are codominant. Atherosclerotic changes are present at the dural margin. The PICA origins are visualized and normal. The basilar artery is normal. Both posterior cerebral arteries originate from the basilar tip. There is no significant proximal stenosis or occlusion within either posterior cerebral artery. Venous sinuses: The dural sinuses are patent. The right transverse sinus is dominant. The straight sinus deep cerebral veins are intact. The cortical veins are unremarkable. Anatomic variants: None Delayed phase: Not performed. Arterial source images are unremarkable. Review of the MIP images confirms the above findings IMPRESSION: 1. No emerging large vessel occlusion. 2. Atherosclerotic changes at the aortic arch, carotid bifurcations, dural margin of vertebral arteries, and cavernous internal carotid arteries without significant stenoses. 3. Mild diffuse segmental branch vessel  irregularity within both the anterior and posterior circulation suggesting intracranial atherosclerotic change. There is no significant proximal stenosis or occlusion. 4. Multilevel spondylosis of the cervical spine. Electronically Signed   By: San Morelle M.D.   On: 06/30/2016 17:03   Dg Chest 2 View  Result Date: 06/30/2016 CLINICAL DATA:  Cough, recent episode of pneumonia, stroke symptoms. History of bladder malignancy EXAM: CHEST  2 VIEW COMPARISON:  Chest x-ray of June 15, 2016 FINDINGS: The lungs are adequately inflated. The interstitial markings are coarse in the mid and lower lung zones but have improved in the upper lobes. The cardiac silhouette remains enlarged. The pulmonary vascularity is not engorged. There is no pleural effusion. There is calcification in the wall of the thoracic aorta. There is multilevel degenerative disc disease of the thoracic spine. IMPRESSION: There has been some improvement in the appearance of the lungs especially upper lobes but coarse interstitial markings persist in the lower lobes. There is no discrete pneumonia. There is stable cardiomegaly without pulmonary vascular congestion. Thoracic aortic atherosclerosis. Electronically Signed   By: David  Martinique M.D.  On: 06/30/2016 15:47   Dg Chest 2 View  Result Date: 06/15/2016 CLINICAL DATA:  Dizziness and confusion EXAM: CHEST  2 VIEW COMPARISON:  Aug 09, 2013 FINDINGS: There is cardiomegaly with mild pulmonary venous hypertension. There is mild interstitial edema in the lung bases. There is focal airspace consolidation in the medial left base. Lungs elsewhere clear. No adenopathy. No bone lesions. IMPRESSION: Findings consistent with a degree of congestive heart failure. Suspect superimposed pneumonia medial left base. Followup PA and lateral chest radiographs recommended in 3-4 weeks following trial of antibiotic therapy to ensure resolution and exclude underlying malignancy. Electronically Signed   By:  Lowella Grip III M.D.   On: 06/15/2016 07:58   Ct Head Wo Contrast  Result Date: 06/15/2016 CLINICAL DATA:  Lower extremity weakness.  Fall. EXAM: CT HEAD WITHOUT CONTRAST TECHNIQUE: Contiguous axial images were obtained from the base of the skull through the vertex without intravenous contrast. COMPARISON:  Head CT November 15, 2015 FINDINGS: Brain: Moderate diffuse atrophy is stable. There is no intracranial mass, hemorrhage, extra-axial fluid collection, or midline shift. There is mild patchy small vessel disease in the centra semiovale bilaterally. Elsewhere gray-white compartments appear normal. No acute infarct evident. Vascular: No hyperdense vessel. There are foci of calcification in each carotid siphon region as well as in each distal vertebral artery. Skull: The bony calvarium appears intact. Sinuses/Orbits: There is mucosal thickening and opacification in multiple ethmoid air cells. There is mucosal thickening in the anteromedial right sphenoid sinus. Visualized paranasal sinuses elsewhere are clear. Visualized orbits appear symmetric bilaterally with the exception of apparent cataract removal on the right. Other: Mastoid air cells are clear. IMPRESSION: Atrophy with relatively mild periventricular small vessel disease, stable. No intracranial mass, hemorrhage, or extra-axial fluid collection. No acute appearing infarct. Areas of arterial vascular calcification noted. There is paranasal sinus disease, primarily in multiple ethmoid air cells. Electronically Signed   By: Lowella Grip III M.D.   On: 06/15/2016 08:10   Ct Angio Neck W Or Wo Contrast  Result Date: 06/30/2016 CLINICAL DATA:  New onset right-sided facial droop and intermittent dysarthria over the last 2 days. EXAM: CT ANGIOGRAPHY HEAD AND NECK TECHNIQUE: Multidetector CT imaging of the head and neck was performed using the standard protocol during bolus administration of intravenous contrast. Multiplanar CT image reconstructions  and MIPs were obtained to evaluate the vascular anatomy. Carotid stenosis measurements (when applicable) are obtained utilizing NASCET criteria, using the distal internal carotid diameter as the denominator. CONTRAST:  50 mL Isovue 370 COMPARISON:  CT head without contrast from the same day. MRI brain 06/16/2016 FINDINGS: CTA NECK FINDINGS Aortic arch: A 3 vessel arch configuration is present. Atherosclerotic changes are present at the aortic arch without significant stenosis at the great vessel origins. There is no aneurysm. Right carotid system: Common carotid artery is tortuous proximally. There is no focal stenosis. Atherosclerotic calcifications are present at the right carotid bifurcation without significant stenosis relative to the more distal vessel. Remainder of the right internal carotid artery is normal. Left carotid system: The left common carotid artery is tortuous proximally. There is no significant stenosis. Atherosclerotic changes are present at the carotid bifurcation without significant stenosis. The more distal left internal carotid artery is normal. Vertebral arteries: Both vertebral arteries originate from the subclavian arteries without significant stenoses. The vertebral arteries are codominant. There is no significant stenosis or vascular injury to either vertebral artery in the neck. Skeleton: Mild endplate degenerative and facet changes are present. No focal  lytic or blastic lesions are present. There is exaggerated thoracic kyphosis and cervical lordosis. Other neck: No focal mucosal or submucosal lesions are present. Bilateral thyroid nodules are present without a dominant lesion. There is no significant adenopathy. The salivary glands are within normal limits. Upper chest: The lung apices demonstrate bilateral dependent atelectasis. No focal nodule or mass lesion is present. Review of the MIP images confirms the above findings CTA HEAD FINDINGS Anterior circulation: Atherosclerotic  calcifications are present within the cavernous internal carotid arteries bilaterally without a significant stenosis through the ICA terminus. The A1 and M1 segments are normal. The anterior communicating artery is patent. The MCA bifurcations are intact. There is segmental attenuation of ACA and MCA branch vessels bilaterally without a significant proximal stenosis or occlusion. Posterior circulation: The vertebral arteries are codominant. Atherosclerotic changes are present at the dural margin. The PICA origins are visualized and normal. The basilar artery is normal. Both posterior cerebral arteries originate from the basilar tip. There is no significant proximal stenosis or occlusion within either posterior cerebral artery. Venous sinuses: The dural sinuses are patent. The right transverse sinus is dominant. The straight sinus deep cerebral veins are intact. The cortical veins are unremarkable. Anatomic variants: None Delayed phase: Not performed. Arterial source images are unremarkable. Review of the MIP images confirms the above findings IMPRESSION: 1. No emerging large vessel occlusion. 2. Atherosclerotic changes at the aortic arch, carotid bifurcations, dural margin of vertebral arteries, and cavernous internal carotid arteries without significant stenoses. 3. Mild diffuse segmental branch vessel irregularity within both the anterior and posterior circulation suggesting intracranial atherosclerotic change. There is no significant proximal stenosis or occlusion. 4. Multilevel spondylosis of the cervical spine. Electronically Signed   By: San Morelle M.D.   On: 06/30/2016 17:03   Ct Chest W Contrast  Result Date: 07/01/2016 CLINICAL DATA:  Bladder cancer, non-Hodgkin's lymphoma and Barrett's esophagus. EXAM: CT CHEST, ABDOMEN, AND PELVIS WITH CONTRAST TECHNIQUE: Multidetector CT imaging of the chest, abdomen and pelvis was performed following the standard protocol during bolus administration of  intravenous contrast. CONTRAST:  185mL ISOVUE-300 IOPAMIDOL (ISOVUE-300) INJECTION 61% COMPARISON:  CXR from 06/30/2016, chest CT from 09/09/2011, CT chest, abdomen and pelvis from 09/09/2011 FINDINGS: CT CHEST FINDINGS Cardiovascular: Undo top normal size cardiac chambers without pericardial effusion. No large central pulmonary embolus. Aortic atherosclerosis without aneurysm. No dissection. Mild ectasia of the ascending aorta as before up to 3.9 cm. Normal branch pattern for the great vessels. Mediastinum/Nodes: Stable subcentimeter left-sided thyroid nodules since 2013 consistent with benign findings. The largest currently is approximately 12 mm. Fat containing right lower paratracheal lymph node measuring up to 13 mm is without significant change. Stable hilar lymph nodes, the largest on the right approximately 9 mm short axis and on the left, 8 mm short axis. Lungs/Pleura: Stable calcified and noncalcified subcentimeter pulmonary nodules dating back to 2013 consistent with benign findings, index nodule the left upper lobe measuring 7 mm, series 5, image 76. Bibasilar dependent atelectasis. There is lingular, right middle lobe and both lower lobe bronchiectasis with mild peribronchial thickening and chronic minimal parenchymal scarring and/or atelectasis. Musculoskeletal: Lower thoracic kyphosis attributable to multilevel degenerative disc disease. Small osteophytes are noted along the anterior aspect of the mid and lower thoracic spine. No acute nor suspicious osseous abnormalities. CT ABDOMEN PELVIS FINDINGS Hepatobiliary: Status post cholecystectomy. No space-occupying mass of the liver. No biliary dilatation. Pancreas: Unremarkable. No pancreatic ductal dilatation or surrounding inflammatory changes. Spleen: Normal in size without focal abnormality. Adrenals/Urinary  Tract: Adrenal glands are unremarkable. Kidneys are normal, without renal calculi, focal lesion, or hydronephrosis. Bladder is unremarkable.  Stomach/Bowel: Stomach is within normal limits. Appendix is surgically absent by report. No evidence of bowel wall thickening, distention, or inflammatory changes. Vascular/Lymphatic: Bandlike mesenteric soft tissue attenuation with calcification in the mid abdomen is slightly more prominent than on prior consistent with treated lymphoma. Small scattered mesenteric lymph nodes are seen in the right lower quadrant and root of the mesentery as before. No inguinal or pelvic sidewall adenopathy. No retroperitoneal or mesenteric lymphadenopathy. No porta hepatic nor epigastric enlargement of lymph nodes Reproductive: Prostate is unremarkable. Other: No abdominal wall hernia or abnormality. No abdominopelvic ascites. Musculoskeletal: No acute or significant osseous findings. Degenerative disc disease L4-5. IMPRESSION: 1. Chronic since 2013 are stable pulmonary nodules noted bilaterally, index nodule the left upper lobe measures 7 mm consistent with benign findings. 2. Hazy mesenteric fat in the mid abdomen with calcification consistent with treated non-Hodgkin's lymphoma. No recurrence of lymphadenopathy identified within chest, abdomen or pelvis. 3. Cholecystectomy. Electronically Signed   By: Ashley Royalty M.D.   On: 07/01/2016 20:12   Mr Brain Wo Contrast  Result Date: 07/01/2016 CLINICAL DATA:  Acute presentation with slurred speech and right-sided facial droop. Right upper and lower extremity weakness. EXAM: MRI HEAD WITHOUT CONTRAST TECHNIQUE: Multiplanar, multiecho pulse sequences of the brain and surrounding structures were obtained without intravenous contrast. COMPARISON:  Multiple CT studies done yesterday.  MRI 06/16/2016. FINDINGS: Brain: There is acute infarction affecting the left para median pons. No acute infarction affecting the cerebral hemispheres. No focal cerebellar insult. Few old small vessel infarctions within the thalami I. Moderate chronic small-vessel ischemic changes of the cerebral  hemispheric white matter. No cortical or large vessel territory infarction. No mass lesion, hemorrhage, hydrocephalus or extra-axial collection. Vascular: Major vessels at the base of the brain show flow. Skull and upper cervical spine: Negative Sinuses/Orbits: Clear except for mild maxillary sinus mucosal thickening and retention cyst. Orbits negative. Other: None significant IMPRESSION: Acute infarction affecting the left para median pons. No hemorrhage. Chronic small-vessel ischemic changes elsewhere as outlined above. Electronically Signed   By: Nelson Chimes M.D.   On: 07/01/2016 07:11   Mr Brain Wo Contrast  Result Date: 06/16/2016 CLINICAL DATA:  84 y/o  M; generalized weakness and fall. EXAM: MRI HEAD WITHOUT CONTRAST TECHNIQUE: Multiplanar, multiecho pulse sequences of the brain and surrounding structures were obtained without intravenous contrast. COMPARISON:  06/15/2016 CT of the head. FINDINGS: Brain: No acute infarction, hemorrhage, hydrocephalus, extra-axial collection or mass lesion. Foci of T2 FLAIR hyperintense signal abnormality in subcortical and periventricular white matter are nonspecific are compatible with moderate chronic microvascular ischemic changes of the brain. Mild brain parenchymal volume loss for age. Small chronic lacunar infarct within the left caudate head and hemosiderin stained chronic lacunar infarct in right lentiform nucleus. Vascular: Normal flow voids. Skull and upper cervical spine: Normal marrow signal. Sinuses/Orbits: Mild ethmoid and maxillary sinus mucosal thickening and large right maxillary sinus mucous retention cyst. No abnormal signal of the mastoid air cells. Right intra-ocular lens replacement. Other: None. IMPRESSION: 1. No acute intracranial abnormality. 2. Moderate chronic microvascular ischemic changes and mild parenchymal volume loss of the brain. Small chronic lacunar infarcts within right lentiform nucleus and left caudate head. 3. Mild paranasal sinus  disease. Electronically Signed   By: Kristine Garbe M.D.   On: 06/16/2016 19:20   Mr Cervical Spine Wo Contrast  Result Date: 06/17/2016 CLINICAL DATA:  Progressive  weakness.  Drooling. EXAM: MRI CERVICAL SPINE WITHOUT CONTRAST TECHNIQUE: Multiplanar, multisequence MR imaging of the cervical spine was performed. No intravenous contrast was administered. COMPARISON:  None. FINDINGS: Alignment: Exaggerated lordosis.  No listhesis. Vertebrae: No fracture, evidence of discitis, or bone lesion. Mild marrow edema about the left C4-5 facet, degenerative appearing. Cord: Normal signal and morphology. Posterior Fossa, vertebral arteries, paraspinal tissues: Bilateral thyroid nodules, up to 12 mm on the left, below size threshold for recommend sonographic follow-up. Disc levels: C2-3: Facet arthropathy with moderate spurring on the left. Ligamentum flavum thickening. Patent canal and foramina C3-4: Facet arthropathy with spurring greater on the left. Ligamentum flavum thickening. Mild endplate ridging. Patent canal and foramina C4-5: Facet arthropathy with moderate bilateral spurring. Ligamentum flavum thickening. No impingement C5-6: Facet arthropathy with mild spurring. Ligamentum flavum thickening. Disc narrowing with posterior disc osteophyte complex. No impingement C6-7: Mild facet spurring.  Mild disc narrowing.  No impingement C7-T1:Mild facet arthropathy with possible ankylosis on the right. Mild disc narrowing. No impingement IMPRESSION: 1. No explanation for weakness. The canal and foramina are diffusely patent. No evidence of myelopathy. 2. Facet arthropathy and mild for age disc degeneration. Mild degenerative marrow edema noted about the left C4-5 facet. Electronically Signed   By: Monte Fantasia M.D.   On: 06/17/2016 12:41   Ct Abdomen Pelvis W Contrast  Result Date: 07/01/2016 CLINICAL DATA:  Bladder cancer, non-Hodgkin's lymphoma and Barrett's esophagus. EXAM: CT CHEST, ABDOMEN, AND PELVIS  WITH CONTRAST TECHNIQUE: Multidetector CT imaging of the chest, abdomen and pelvis was performed following the standard protocol during bolus administration of intravenous contrast. CONTRAST:  174mL ISOVUE-300 IOPAMIDOL (ISOVUE-300) INJECTION 61% COMPARISON:  CXR from 06/30/2016, chest CT from 09/09/2011, CT chest, abdomen and pelvis from 09/09/2011 FINDINGS: CT CHEST FINDINGS Cardiovascular: Undo top normal size cardiac chambers without pericardial effusion. No large central pulmonary embolus. Aortic atherosclerosis without aneurysm. No dissection. Mild ectasia of the ascending aorta as before up to 3.9 cm. Normal branch pattern for the great vessels. Mediastinum/Nodes: Stable subcentimeter left-sided thyroid nodules since 2013 consistent with benign findings. The largest currently is approximately 12 mm. Fat containing right lower paratracheal lymph node measuring up to 13 mm is without significant change. Stable hilar lymph nodes, the largest on the right approximately 9 mm short axis and on the left, 8 mm short axis. Lungs/Pleura: Stable calcified and noncalcified subcentimeter pulmonary nodules dating back to 2013 consistent with benign findings, index nodule the left upper lobe measuring 7 mm, series 5, image 76. Bibasilar dependent atelectasis. There is lingular, right middle lobe and both lower lobe bronchiectasis with mild peribronchial thickening and chronic minimal parenchymal scarring and/or atelectasis. Musculoskeletal: Lower thoracic kyphosis attributable to multilevel degenerative disc disease. Small osteophytes are noted along the anterior aspect of the mid and lower thoracic spine. No acute nor suspicious osseous abnormalities. CT ABDOMEN PELVIS FINDINGS Hepatobiliary: Status post cholecystectomy. No space-occupying mass of the liver. No biliary dilatation. Pancreas: Unremarkable. No pancreatic ductal dilatation or surrounding inflammatory changes. Spleen: Normal in size without focal abnormality.  Adrenals/Urinary Tract: Adrenal glands are unremarkable. Kidneys are normal, without renal calculi, focal lesion, or hydronephrosis. Bladder is unremarkable. Stomach/Bowel: Stomach is within normal limits. Appendix is surgically absent by report. No evidence of bowel wall thickening, distention, or inflammatory changes. Vascular/Lymphatic: Bandlike mesenteric soft tissue attenuation with calcification in the mid abdomen is slightly more prominent than on prior consistent with treated lymphoma. Small scattered mesenteric lymph nodes are seen in the right lower quadrant and root of  the mesentery as before. No inguinal or pelvic sidewall adenopathy. No retroperitoneal or mesenteric lymphadenopathy. No porta hepatic nor epigastric enlargement of lymph nodes Reproductive: Prostate is unremarkable. Other: No abdominal wall hernia or abnormality. No abdominopelvic ascites. Musculoskeletal: No acute or significant osseous findings. Degenerative disc disease L4-5. IMPRESSION: 1. Chronic since 2013 are stable pulmonary nodules noted bilaterally, index nodule the left upper lobe measures 7 mm consistent with benign findings. 2. Hazy mesenteric fat in the mid abdomen with calcification consistent with treated non-Hodgkin's lymphoma. No recurrence of lymphadenopathy identified within chest, abdomen or pelvis. 3. Cholecystectomy. Electronically Signed   By: Ashley Royalty M.D.   On: 07/01/2016 20:12   Ct Cerebral Perfusion W Contrast  Result Date: 06/30/2016 CLINICAL DATA:  Dysarthria.  Right-sided facial droop. EXAM: CT PERFUSION BRAIN TECHNIQUE: Multiphase CT imaging of the brain was performed following IV bolus contrast injection. Subsequent parametric perfusion maps were calculated using RAPID software. CONTRAST:  40 mL Isovue 370 COMPARISON:  None. FINDINGS: CT Brain Perfusion Findings: CBF (<30%) Volume: 68mL Perfusion (Tmax>6.0s) volume: 75mL Mismatch Volume: 27mL Infarction Location:None No significant patient motion is  present. Arterial input and venous output functions are excellent. IMPRESSION: Negative CT perfusion exam of the head. No evidence for acute infarct or ischemia. Electronically Signed   By: San Morelle M.D.   On: 06/30/2016 16:54   Dg Abd Portable 1v  Result Date: 07/02/2016 CLINICAL DATA:  Feeding tube placement. EXAM: PORTABLE ABDOMEN - 1 VIEW COMPARISON:  Abdomen and pelvis CT dated 07/01/2016. FINDINGS: Feeding tube tip in the mid stomach. Normal bowel gas pattern. Oral contrast in the right colon. Cholecystectomy clips. Thoracolumbar spine degenerative changes. IMPRESSION: Feeding tube tip in the mid stomach. Electronically Signed   By: Claudie Revering M.D.   On: 07/02/2016 15:57   Dg Swallowing Func-speech Pathology  Result Date: 07/01/2016 Objective Swallowing Evaluation: Type of Study: MBS-Modified Barium Swallow Study Patient Details Name: YAKOV BERGEN MRN: [id number] Date of Birth: July 23, 1927 Today's Date: 07/01/2016 Time: SLP Start Time (ACUTE ONLY): 0910-SLP Stop Time (ACUTE ONLY): 0933 SLP Time Calculation (min) (ACUTE ONLY): 23 min Past Medical History: Past Medical History: Diagnosis Date . Arthritis  . Barrett esophagus  . Bladder cancer ([place]) dx'd [phone]  surg only . Colon polyps   adenomatous . Difficulty sleeping   OCCASIONALLY . Diverticulosis of colon (without mention of hemorrhage)  . Frequency of urination  . GERD (gastroesophageal reflux disease)  . Hiatal hernia  . History of bladder cancer 1981  EXCISION ONLY . History of skin cancer   BASAL CELL . Hyperlipidemia  . Lymphoma, non-Hodgkin's ([place]) 2011  [place]  . Macular degeneration   LEFT EYE . Multiple nodules of lung 09/16/2011 . PAF (paroxysmal atrial fibrillation) ([place])  . Personal history of colonic polyps 07/15/2010  tubular adenoma . Prostate cancer [facility])   patient denies . PUD (peptic ulcer disease)  . Stomach cancer ([place])   NHL origin Past Surgical History: Past Surgical History: Procedure Laterality Date .  APPENDECTOMY   . bone spur    left shoulder . CHOLECYSTECTOMY   . KNEE ARTHROPLASTY Left 07/30/2015  Procedure: LEFT TOTAL KNEE ARTHROPLASTY WITH COMPUTER NAVIGATION;  Surgeon: Rod Can, MD;  Location: [facility];  Service: Orthopedics;  Laterality: Left; . LASER OF PROSTATE W/ GREEN LIGHT PVP   . TENDON REPAIR    right arm . TRIGGER FINGER RELEASE    bilateral hands . WRIST SURGERY    Bilateral HPI: Ptis an 83 y.o. malewith  PMH significant of PAF, Barrett's esophagus, bladder cancer, non-Hodgkin's lymphoma, OA, andHLD; who presents with acute onset of slurred speech, right-sided facial droop, and right sided upper and lower extremity weakness. He had just been recently hospitalized from 3/14 -3/19 for community-acquired pneumonia. Due to swallowing difficulties patient was evaluated by speech therapy and seen to be silently aspirating. Patient was placed on modified diet (recommended Dys 3 solids, thin liquids). Upon discharge he was sent to a skilled nursing facilityfor rehabilitation. 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 No hemorrhage. Chronic small-vessel ischemic changes elsewhere. CXR showed some improvement in the appearance of the lungs especially upper lobes but coarse interstitial markings persist in the lower lobes. There is no discrete pneumonia. There is stablecardiomegaly without pulmonary vascular congestion.Thoracic aortic atherosclerosis. No Data Recorded Assessment / Plan / Recommendation CHL IP CLINICAL IMPRESSIONS 07/01/2016 Clinical Impression Pt has had a decline in swallow function this admission following acute pontine CVA. Impairments primarily motor based with decreased laryngeal elevation, laryngeal closure and reduced tongue base retraction. Silent penetration and aspiration during initial and multiple swallows with nectar and honey thick consistencies. Chin tuck was ineffective to mitigate laryngeal invasion or to decrease vallecular  residue during swallow. Pt required multiple verbal prompts for cough/throat clear which were weak and unable to clear vestibule. Although puree was not penetrated or aspirated, volume was significant and remained increasing risk. MBS does not diagnose below the level of the UES, however esophageal scan revealed stasis in distal esophagus. Recommend NPO status, oral care, short term alternative nutrition and continue ST intervention. From ST/swallow standpoint, pt is an excellent candidate for inpatient rehab.   SLP Visit Diagnosis Dysphagia, oropharyngeal phase (R13.12) Attention and concentration deficit following -- Frontal lobe and executive function deficit following -- Impact on safety and function --   CHL IP TREATMENT RECOMMENDATION 07/01/2016 Treatment Recommendations Therapy as outlined in treatment plan below   Prognosis 07/01/2016 Prognosis for Safe Diet Advancement Good Barriers to Reach Goals -- Barriers/Prognosis Comment -- CHL IP DIET RECOMMENDATION 07/01/2016 SLP Diet Recommendations NPO Liquid Administration via -- Medication Administration Via alternative means Compensations -- Postural Changes --   CHL IP OTHER RECOMMENDATIONS 07/01/2016 Recommended Consults -- Oral Care Recommendations Oral care QID Other Recommendations --   CHL IP FOLLOW UP RECOMMENDATIONS 07/01/2016 Follow up Recommendations Inpatient Rehab   CHL IP FREQUENCY AND DURATION 07/01/2016 Speech Therapy Frequency (ACUTE ONLY) min 2x/week Treatment Duration 2 weeks      CHL IP ORAL PHASE 07/01/2016 Oral Phase Impaired Oral - Pudding Teaspoon -- Oral - Pudding Cup -- Oral - Honey Teaspoon -- Oral - Honey Cup Lingual/palatal residue Oral - Nectar Teaspoon -- Oral - Nectar Cup Lingual/palatal residue Oral - Nectar Straw -- Oral - Thin Teaspoon -- Oral - Thin Cup Lingual/palatal residue Oral - Thin Straw -- Oral - Puree Lingual/palatal residue Oral - Mech Soft NT Oral - Regular -- Oral - Multi-Consistency -- Oral - Pill -- Oral Phase - Comment  --  CHL IP PHARYNGEAL PHASE 07/01/2016 Pharyngeal Phase Impaired Pharyngeal- Pudding Teaspoon -- Pharyngeal -- Pharyngeal- Pudding Cup -- Pharyngeal -- Pharyngeal- Honey Teaspoon -- Pharyngeal -- Pharyngeal- Honey Cup Pharyngeal residue - valleculae;Reduced tongue base retraction;Penetration/Aspiration during swallow;Reduced laryngeal elevation;Reduced airway/laryngeal closure Pharyngeal Material enters airway, passes BELOW cords without attempt by patient to eject out (silent aspiration);Material enters airway, CONTACTS cords and not ejected out Pharyngeal- Nectar Teaspoon -- Pharyngeal -- Pharyngeal- Nectar Cup Delayed swallow initiation-vallecula;Pharyngeal residue - valleculae;Reduced tongue base  retraction;Reduced laryngeal elevation;Penetration/Aspiration during swallow;Reduced airway/laryngeal closure;Pharyngeal residue - pyriform Pharyngeal Material enters airway, passes BELOW cords without attempt by patient to eject out (silent aspiration);Material enters airway, CONTACTS cords and not ejected out Pharyngeal- Nectar Straw -- Pharyngeal -- Pharyngeal- Thin Teaspoon -- Pharyngeal -- Pharyngeal- Thin Cup NT Pharyngeal -- Pharyngeal- Thin Straw -- Pharyngeal -- Pharyngeal- Puree Pharyngeal residue - valleculae;Reduced tongue base retraction Pharyngeal -- Pharyngeal- Mechanical Soft NT Pharyngeal -- Pharyngeal- Regular -- Pharyngeal -- Pharyngeal- Multi-consistency -- Pharyngeal -- Pharyngeal- Pill -- Pharyngeal -- Pharyngeal Comment --  CHL IP CERVICAL ESOPHAGEAL PHASE 07/01/2016 Cervical Esophageal Phase WFL Pudding Teaspoon -- Pudding Cup -- Honey Teaspoon -- Honey Cup -- Nectar Teaspoon -- Nectar Cup -- Nectar Straw -- Thin Teaspoon -- Thin Cup -- Thin Straw -- Puree -- Mechanical Soft -- Regular -- Multi-consistency -- Pill -- Cervical Esophageal Comment -- CHL IP GO 06/16/2016 Functional Assessment Tool Used skilled clinical judgement Functional Limitations Swallowing Swallow Current Status (P2951) CK  Swallow Goal Status (O8416) CJ Swallow Discharge Status (S0630) (None) Motor Speech Current Status (Z6010) (None) Motor Speech Goal Status (X3235) (None) Motor Speech Goal Status (T7322) (None) Spoken Language Comprehension Current Status (G2542) (None) Spoken Language Comprehension Goal Status (H0623) (None) Spoken Language Comprehension Discharge Status (J6283) (None) Spoken Language Expression Current Status (T5176) (None) Spoken Language Expression Goal Status (H6073) (None) Spoken Language Expression Discharge Status [phone]) (None) Attention Current Status (I9485) (None) Attention Goal Status (I6270) (None) Attention Discharge Status (J5009) (None) Memory Current Status (F8182) (None) Memory Goal Status (X9371) (None) Memory Discharge Status (I9678) (None) Voice Current Status (L3810) (None) Voice Goal Status (F7510) (None) Voice Discharge Status (C5852) (None) Other Speech-Language Pathology Functional Limitation Current Status (D7824) (None) Other Speech-Language Pathology Functional Limitation Goal Status (M3536) (None) Other Speech-Language Pathology Functional Limitation Discharge Status [phone]) (None) [name] 07/01/2016, 11:26 AM [name] [name].Ed CCC-SLP Pager [phone]              Dg Swallowing Func-speech Pathology  Result Date: 06/17/2016 Objective Swallowing Evaluation: Type of Study: MBS-Modified Barium Swallow Study Patient Details Name: TAKESHI TEASDALE MRN: [id number] Date of Birth: 1928-01-29 Today's Date: 06/17/2016 Time: SLP Start Time (ACUTE ONLY): 1230-SLP Stop Time (ACUTE ONLY): 1300 SLP Time Calculation (min) (ACUTE ONLY): 30 min Past Medical History: Past Medical History: Diagnosis Date . Arthritis  . Barrett esophagus  . Bladder cancer ([place]) dx'd [phone]  surg only . Colon polyps   adenomatous . Difficulty sleeping   OCCASIONALLY . Diverticulosis of colon (without mention of hemorrhage)  . Frequency of urination  . GERD (gastroesophageal reflux disease)  . Hiatal  hernia  . History of bladder cancer 1981  EXCISION ONLY . History of skin cancer   BASAL CELL . Hyperlipidemia  . Lymphoma, non-Hodgkin's ([place]) 2011  [place]  . Macular degeneration   LEFT EYE . Multiple nodules of lung 09/16/2011 . PAF (paroxysmal atrial fibrillation) ([place])  . Personal history of colonic polyps 07/15/2010  tubular adenoma . Prostate cancer [facility])   patient denies . PUD (peptic ulcer disease)  . Stomach cancer ([place])   NHL origin Past Surgical History: Past Surgical History: Procedure Laterality Date . APPENDECTOMY   . bone spur    left shoulder . CHOLECYSTECTOMY   . KNEE ARTHROPLASTY Left 07/30/2015  Procedure: LEFT TOTAL KNEE ARTHROPLASTY WITH COMPUTER NAVIGATION;  Surgeon: Rod Can, MD;  Location: [facility];  Service: Orthopedics;  Laterality: Left; . LASER OF PROSTATE W/ GREEN LIGHT PVP   . TENDON  REPAIR    right arm . TRIGGER FINGER RELEASE    bilateral hands . WRIST SURGERY    Bilateral HPI: Dairon Procter Wilsonis a 83 y.o.malewith medical history significant for Paroxysmal AFib, GERD, hiatal hernia, Barrett's esophagus, multiple lung nodules, bladder CA, non-Hodgkin's lymphoma, OA, hyperlipidemia who presented to the ED with c/o generalized weakness and fall. Chest x-ray showed possible CHF with superimposed pneumonia in the middle left lung base. Head CT showed no acute abnormalities, small vessel ischemic disease white matter changes,and ethmoidal paranasal sinus disease. Barium esophagram poor esophageal motility. No stricture or mass. Barium tablet passed readily into the stomach. Negative for reflux. No Data Recorded Assessment / Plan / Recommendation CHL IP CLINICAL IMPRESSIONS 06/17/2016 Clinical Impression  Pt demonstrates a moderate oropharyngeal dysphagia with neuromuscular deficits impacting sensory and motor function. Pt observed to have bilateral ptosis, anterior spillage of secretions on the left (family reports history of left facial droop due to Bell's palsy) and weak cough  strength upon arrival to exam. Despite deficits pt apepars conginitively intact and is able to follow complex commands.  Oral function chaacteraized by mild lingual weakness observed via mild lingual residuals, with functional abiltiy to masticate and transit soft solids (though endurance not taxed). Flash penetration and silent aspriation during the swallow occured with boluses larger than 39ml (estimate) due to slow laryngeal closure and epiglottic deflection. Pt was able trigger a timely swallow and eventually achieve full laryngeal closure which expelled penetrate when possible, but again sluggish movement kept vestible partialy open during bolus passage. Further silent aspiration events also occured after the swallow if pt was cued to cough, which spilled valleuular residual into the airway. Attempted chin tuck without improvement and mendelsohn with visual biofeedback (pt unable after 5 attempts with max cues). Best methods included verbal cues for small sips (bolus size consistently comparable in appearance and fucntion with 90mL teaspoon), oral hold and effortful swallow x2. Given increased residuals with thicker textures, recommend pt initiate a dys 2/thin liquid diet. Recommend water only with oral care at least 3x day with toothbrush and paste. Pt will benefit from exercises to target hyolaryngeal excursion and base of tongue strength as well as EMST. Will f/u for education with family.  SLP Visit Diagnosis Dysphagia, oropharyngeal phase (R13.12) Attention and concentration deficit following -- Frontal lobe and executive function deficit following -- Impact on safety and function --   CHL IP TREATMENT RECOMMENDATION 06/17/2016 Treatment Recommendations Therapy as outlined in treatment plan below   Prognosis 06/17/2016 Prognosis for Safe Diet Advancement Good Barriers to Reach Goals -- Barriers/Prognosis Comment -- CHL IP DIET RECOMMENDATION 06/17/2016 SLP Diet Recommendations Dysphagia 2 (Fine chop)  solids;Thin liquid Liquid Administration via Cup Medication Administration Whole meds with puree Compensations Slow rate;Small sips/bites;Effortful swallow;Minimize environmental distractions;Clear throat intermittently Postural Changes Remain semi-upright after after feeds/meals (Comment)   CHL IP OTHER RECOMMENDATIONS 06/17/2016 Recommended Consults -- Oral Care Recommendations Oral care before and after PO Other Recommendations --   CHL IP FOLLOW UP RECOMMENDATIONS 06/17/2016 Follow up Recommendations 24 hour supervision/assistance   CHL IP FREQUENCY AND DURATION 06/17/2016 Speech Therapy Frequency (ACUTE ONLY) min 3x week Treatment Duration 2 weeks      CHL IP ORAL PHASE 06/17/2016 Oral Phase Impaired Oral - Pudding Teaspoon -- Oral - Pudding Cup -- Oral - Honey Teaspoon -- Oral - Honey Cup -- Oral - Nectar Teaspoon -- Oral - Nectar Cup -- Oral - Nectar Straw -- Oral - Thin Teaspoon -- Oral - Thin Cup Left anterior  bolus loss;Lingual/palatal residue Oral - Thin Straw -- Oral - Puree WFL Oral - Mech Soft WFL Oral - Regular -- Oral - Multi-Consistency -- Oral - Pill -- Oral Phase - Comment --  CHL IP PHARYNGEAL PHASE 06/17/2016 Pharyngeal Phase Impaired Pharyngeal- Pudding Teaspoon -- Pharyngeal -- Pharyngeal- Pudding Cup -- Pharyngeal -- Pharyngeal- Honey Teaspoon -- Pharyngeal -- Pharyngeal- Honey Cup -- Pharyngeal -- Pharyngeal- Nectar Teaspoon -- Pharyngeal -- Pharyngeal- Nectar Cup -- Pharyngeal -- Pharyngeal- Nectar Straw -- Pharyngeal -- Pharyngeal- Thin Teaspoon -- Pharyngeal -- Pharyngeal- Thin Cup Reduced anterior laryngeal mobility;Penetration/Aspiration during swallow;Reduced tongue base retraction;Penetration/Apiration after swallow;Moderate aspiration;Trace aspiration;Pharyngeal residue - valleculae;Compensatory strategies attempted (with notebox) Pharyngeal Material enters airway, passes BELOW cords without attempt by patient to eject out (silent aspiration);Material enters airway, CONTACTS cords and  then ejected out;Material does not enter airway Pharyngeal- Thin Straw -- Pharyngeal -- Pharyngeal- Puree Reduced anterior laryngeal mobility;Reduced tongue base retraction;Pharyngeal residue - valleculae;Compensatory strategies attempted (with notebox) Pharyngeal -- Pharyngeal- Mechanical Soft Reduced anterior laryngeal mobility;Reduced tongue base retraction;Pharyngeal residue - valleculae;Compensatory strategies attempted (with notebox) Pharyngeal -- Pharyngeal- Regular -- Pharyngeal -- Pharyngeal- Multi-consistency -- Pharyngeal -- Pharyngeal- Pill -- Pharyngeal -- Pharyngeal Comment --  No flowsheet data found. CHL IP GO 06/16/2016 Functional Assessment Tool Used skilled clinical judgement Functional Limitations Swallowing Swallow Current Status [id number]) CK Swallow Goal Status (W4132) [place] Swallow Discharge Status [phone]) (None) Motor Speech Current Status [phone]) (None) Motor Speech Goal Status (G6440) (None) Motor Speech Goal Status (H4742) (None) Spoken Language Comprehension Current Status (V9563) (None) Spoken Language Comprehension Goal Status (O7564) (None) Spoken Language Comprehension Discharge Status [phone]) (None) Spoken Language Expression Current Status [phone]) (None) Spoken Language Expression Goal Status [phone]) (None) Spoken Language Expression Discharge Status [phone]) (None) Attention Current Status (U9323) (None) Attention Goal Status (F5732) (None) Attention Discharge Status [phone]) (None) Memory Current Status (Y7062) (None) Memory Goal Status (B7628) (None) Memory Discharge Status (B1517) (None) Voice Current Status (O1607) (None) Voice Goal Status (P7106) (None) Voice Discharge Status (Y6948) (None) Other Speech-Language Pathology Functional Limitation Current Status (N4627) (None) Other Speech-Language Pathology Functional Limitation Goal Status (O3500) (None) Other Speech-Language Pathology Functional Limitation Discharge Status [phone]) (None) DeBlois, Katherene Ponto 06/17/2016, 2:21 PM               Ct Head Code Stroke W/o Cm  Addendum Date: 06/30/2016   ADDENDUM REPORT: 06/30/2016 16:06 ADDENDUM: Study discussed by telephone on 06/30/2016 with Dr. Harrell Gave Tegeler, at 1534 hours. Electronically Signed   By: Genevie Ann M.D.   On: 06/30/2016 16:06   Result Date: 06/30/2016 CLINICAL DATA:  Code stroke. 83 year old male with episode of right leg weakness slurred speech and right facial droop which lasted about 1 hour today. Facial droop persists but other symptoms have resolved. Initial encounter. EXAM: CT HEAD WITHOUT CONTRAST TECHNIQUE: Contiguous axial images were obtained from the base of the skull through the vertex without intravenous contrast. COMPARISON:  Brain MRI 06/16/2016 and earlier. FINDINGS: Brain: Stable cerebral volume. No midline shift, mass effect, or evidence of intracranial mass lesion. No ventriculomegaly. No acute intracranial hemorrhage identified. Stable gray-white matter differentiation throughout the brain. Chronic white matter hypodensity appears stable. No cortically based acute infarct identified. Vascular: Calcified atherosclerosis at the skull base. No suspicious intracranial vascular hyperdensity. Skull: No acute osseous abnormality identified. Sinuses/Orbits: Visualized paranasal sinuses and mastoids are stable and well pneumatized. Other: No acute orbit or scalp soft tissue findings. ASPECTS [facility] Stroke Program Early CT Score) - Ganglionic level infarction (caudate, lentiform nuclei, internal capsule,  insula, M1-M3 cortex): 7 - Supraganglionic infarction (M4-M6 cortex): 3 Total score (0-10 with 10 being normal): 10 IMPRESSION: 1. Stable non contrast CT appearance of the brain, no acute intracranial abnormality. 2. ASPECTS is 10. 3. Chronic white matter changes. Electronically Signed: By: Genevie Ann M.D. On: 06/30/2016 15:24     CBC  Recent Labs Lab 06/30/16 1524 06/30/16 1530 07/02/16 0403  WBC 8.2  --  6.2  HGB 12.1* 12.9* 11.9*  HCT 38.3* 38.0* 37.4*   PLT 194  --  160  MCV 85.5  --  84.6  MCH 27.0  --  26.9  MCHC 31.6  --  31.8  RDW 13.5  --  13.4  LYMPHSABS 1.7  --   --   MONOABS 0.5  --   --   EOSABS 0.1  --   --   BASOSABS 0.0  --   --     Chemistries   Recent Labs Lab 06/30/16 1524 06/30/16 1530 07/03/16 0346  NA 140 142 138  K 3.9 3.8 3.3*  CL 106 105 109  CO2 25  --  21*  GLUCOSE 103* 98 129*  BUN 28* 30* 26*  CREATININE 1.21 1.10 1.06  CALCIUM 9.1  --  8.5*  AST 23  --   --   ALT 12*  --   --   ALKPHOS 30*  --   --   BILITOT 0.8  --   --    ------------------------------------------------------------------------------------------------------------------ estimated creatinine clearance is 59.4 mL/min (by C-G formula based on SCr of 1.06 mg/dL). ------------------------------------------------------------------------------------------------------------------ No results for input(s): HGBA1C in the last 72 hours. ------------------------------------------------------------------------------------------------------------------ No results for input(s): CHOL, HDL, LDLCALC, TRIG, CHOLHDL, LDLDIRECT in the last 72 hours. ------------------------------------------------------------------------------------------------------------------ No results for input(s): TSH, T4TOTAL, T3FREE, THYROIDAB in the last 72 hours.  Invalid input(s): FREET3 ------------------------------------------------------------------------------------------------------------------ No results for input(s): VITAMINB12, FOLATE, FERRITIN, TIBC, IRON, RETICCTPCT in the last 72 hours.  Coagulation profile  Recent Labs Lab 06/30/16 1524  INR 1.58    No results for input(s): DDIMER in the last 72 hours.  Cardiac Enzymes No results for input(s): CKMB, TROPONINI, MYOGLOBIN in the last 168 hours.  Invalid input(s): CK ------------------------------------------------------------------------------------------------------------------ Invalid input(s):  POCBNP   CBG:  Recent Labs Lab 07/03/16 2014 07/04/16 0002 07/04/16 0402 07/04/16 0729 07/04/16 1138  GLUCAP 132* 125* 119* 116* 107*       Studies: Dg Abd Portable 1v  Result Date: 07/02/2016 CLINICAL DATA:  Feeding tube placement. EXAM: PORTABLE ABDOMEN - 1 VIEW COMPARISON:  Abdomen and pelvis CT dated 07/01/2016. FINDINGS: Feeding tube tip in the mid stomach. Normal bowel gas pattern. Oral contrast in the right colon. Cholecystectomy clips. Thoracolumbar spine degenerative changes. IMPRESSION: Feeding tube tip in the mid stomach. Electronically Signed   By: Claudie Revering M.D.   On: 07/02/2016 15:57      Lab Results  Component Value Date   HGBA1C 5.5 07/01/2016   Lab Results  Component Value Date   LDLCALC 68 07/01/2016   CREATININE 1.06 07/03/2016       Scheduled Meds: . enoxaparin (LOVENOX) injection  1 mg/kg Subcutaneous Q12H  . free water  200 mL Per Tube Q6H  . guaiFENesin  600 mg Oral BID  . pantoprazole  40 mg Oral BID  . simvastatin  40 mg Oral QHS  . tamsulosin  0.4 mg Oral QHS   Continuous Infusions: . sodium chloride 75 mL (07/04/16 1117)  . feeding supplement (JEVITY 1.2 CAL) 1,000 mL (07/04/16 0503)  LOS: 3 days    Time spent: >30 MINS    [facility] Pager [phone]. If 7PM-7AM, please contact night-coverage at [email or web address], password [facility] 07/04/2016, 1:03 PM  LOS: 3 days

## 2016-07-04 NOTE — Progress Notes (Signed)
STROKE TEAM PROGRESS NOTE   HISTORY OF PRESENT ILLNESS (per record) Jason [name] is an 83 y.o. male who was seen one weak ago for swallowing difficulties and generalized weakness. At that time " I spoke to the daughter in depth. Apparently he has been having difficulty with swallowing and choking on his food. He has been seen by his PCP and they thought it might be his records esophagus. Patient is also been noticing to drool on the left side of his mouth more than usual. They attributed that also to his Bell's palsy. It was noted that his his mother did have Parkinson's however naming the symptoms of Parkinson's patient's daughter did not note any significant symptoms that jumped out to her. She has noticed that while he eats he often tires out while chewing, in addition he also will have a hard time holding his head up for prolonged periods of time." Speech had seen patient at that time and noted "Oral function chaacteraized by mild lingual weakness observed via mild lingual residuals, with functional abiltiy to masticate and transit soft solids (though endurance not taxed). Flash penetration and silent aspriation during the swallow occured with boluses larger than 79ml (estimate) due to slow laryngeal closure and epiglottic deflection. Pt was able trigger a timely swallow and eventually achieve full laryngeal closure which expelled penetrate when possible, but again sluggish movement kept vestible partialy open during bolus passage. Further silent aspiration events also occured after the swallow if pt was cued to cough, which spilled valleuular residual into the airway. Attempted chin tuck without improvement and mendelsohn with visual biofeedback (pt unable after 5 attempts with max cues). Best methods included verbal cues for small sips (bolus size consistently comparable in appearance and fucntion with 68mL teaspoon), oral hold and effortful swallow x2. "  Today he was noted to have a sudden right facial  droop, worsening of dysarthria and right leg plegia.   Neurology was called and patient was brought for a emergent CT perfusion to evaluate for M1 stenosis or occlusion.   Of note he had a Myasthenia panel obtained on previous hospitalization which was negative.   He was last known well 06/30/2016 at 11:30a. Patient was not administered IV t-PA secondary to out of window. He was admitted for further evaluation and treatment.   SUBJECTIVE (INTERVAL HISTORY) His daughters are  at bedside. He had panda tube passed yesterday and is getting tube feeding. No new complaints. OBJECTIVE Temp:  [97.7 F (36.5 C)-98.2 F (36.8 C)] 98.2 F (36.8 C) (04/02 0602) Pulse Rate:  [55-66] 61 (04/02 0949) Cardiac Rhythm: Normal sinus rhythm;Bundle branch block (04/02 0837) Resp:  [18-20] 20 (04/02 0949) BP: (134-160)/(64-67) 147/66 (04/02 0949) SpO2:  [96 %-100 %] 98 % (04/02 0949)  CBC:   Recent Labs Lab 06/30/16 1524 06/30/16 1530 07/02/16 0403  WBC 8.2  --  6.2  NEUTROABS 5.8  --   --   HGB 12.1* 12.9* 11.9*  HCT 38.3* 38.0* 37.4*  MCV 85.5  --  84.6  PLT 194  --  696    Basic Metabolic Panel:   Recent Labs Lab 06/30/16 1524 06/30/16 1530 07/03/16 0346  NA 140 142 138  K 3.9 3.8 3.3*  CL 106 105 109  CO2 25  --  21*  GLUCOSE 103* 98 129*  BUN 28* 30* 26*  CREATININE 1.21 1.10 1.06  CALCIUM 9.1  --  8.5*    Lipid Panel:     Component Value Date/Time  CHOL 123 07/01/2016 0746   TRIG 52 07/01/2016 0746   HDL 45 07/01/2016 0746   CHOLHDL 2.7 07/01/2016 0746   VLDL 10 07/01/2016 0746   LDLCALC 68 07/01/2016 0746   HgbA1c:  Lab Results  Component Value Date   HGBA1C 5.5 07/01/2016   Urine Drug Screen:     Component Value Date/Time   LABOPIA NONE DETECTED 06/15/2016 0856   COCAINSCRNUR NONE DETECTED 06/15/2016 0856   LABBENZ NONE DETECTED 06/15/2016 0856   AMPHETMU NONE DETECTED 06/15/2016 0856   THCU NONE DETECTED 06/15/2016 0856   LABBARB NONE DETECTED  06/15/2016 0856      IMAGING I have personally reviewed the radiological images below and agree with the radiology interpretations.  Ct Head Code Stroke W/o Cm 06/30/2016  1. Stable non contrast CT appearance of the brain, no acute intracranial abnormality. 2. ASPECTS is 10. 3. Chronic white matter changes.   Ct Angio Head W Or Wo Contrast Ct Angio Neck W Or Wo Contrast 06/30/2016 1. No emerging large vessel occlusion. 2. Atherosclerotic changes at the aortic arch, carotid bifurcations, dural margin of vertebral arteries, and cavernous internal carotid arteries without significant stenoses. 3. Mild diffuse segmental branch vessel irregularity within both the anterior and posterior circulation suggesting intracranial atherosclerotic change. There is no significant proximal stenosis or occlusion. 4. Multilevel spondylosis of the cervical spine.   Ct Cerebral Perfusion W Contrast 06/30/2016 Negative CT perfusion exam of the head. No evidence for acute infarct or ischemia.   Mr Brain Wo Contrast 07/01/2016 Acute infarction affecting the left para median pons. No hemorrhage. Chronic small-vessel ischemic changes elsewhere as outlined above.   Dg Chest 2 View 06/30/2016 There has been some improvement in the appearance of the lungs especially upper lobes but coarse interstitial markings persist in the lower lobes. There is no discrete pneumonia. There is stable cardiomegaly without pulmonary vascular congestion. Thoracic aortic atherosclerosis.   TTE 06/17/16 - Technically difficult study with poor acoustic windows. Normal LV   size with severe LV hypertrophy. EF 55-60. Mildly dilated RV with   normal systolic function. No significant valvular abnormalities.   PHYSICAL EXAM  Temp:  [97.7 F (36.5 C)-98.2 F (36.8 C)] 98.2 F (36.8 C) (04/02 0602) Pulse Rate:  [55-66] 61 (04/02 0949) Resp:  [18-20] 20 (04/02 0949) BP: (134-160)/(64-67) 147/66 (04/02 0949) SpO2:  [96 %-100 %] 98 % (04/02  0949)  General - Well nourished, well developed elderly Caucasian male, in no apparent distress.  Ophthalmologic - Fundi not visualized due to noncooperation.  Cardiovascular - Regular rate and rhythm, not in afib.  Mental Status -  Level of arousal and orientation to time, place, and person were intact. Language including expression, naming, repetition, comprehension was assessed and found intact,  mild dysarthria. Fund of Knowledge was assessed and was intact.  Cranial Nerves II - XII - II - Visual field intact OU. III, IV, VI - Extraocular movements intact. V - Facial sensation intact bilaterally. VII - right nasolabial fold flattening. VIII - Hearing & vestibular intact bilaterally. X - Palate elevates symmetrically, mild dysarthria. XI - Chin turning & shoulder shrug intact bilaterally. XII - Tongue protrusion intact.  Motor Strength - The patient's strength was normal in all extremities and pronator drift was absent.  Bulk was normal and fasciculations were absent.    Reflexes - The patient's reflexes were 1+ in all extremities and he had no pathological reflexes.  Sensory - Light touch, temperature/pinprick were assessed and were symmetrical.  Coordination - The patient had normal movements in the hands with no ataxia or dysmetria.  Tremor was absent.  Gait and Station - deferred ASSESSMENT/PLAN [name] is a 83 y.o. male with history of PAF on Eliquis, Barrett's esophagus, bladder cancer, non-Hodgkin's lymphoma, OA, and HLD presenting with facial droop and right leg weakness. He did not receive IV t-PA due to being outside the window.    Stroke:   L paramedian pontine infarct secondary to small vessel disease source. However, pt has pAfib on eliquis (compliance). Pt also has multiple cancer history,   Resultant  Dysarthria, and swallowing difficulty  Code Stroke CT no acute stroke. small vessel disease. Aspects 10.   CTA head/neck no ELVO. Atherosclerosis.  Multilevel spondylosis  CT Perfusion negative. No acute infarct  MRI  acute L paramedian pontine infarct. small vessel disease.   LE venous doppler 06/15/16 NO DVT. R&L enlarged lymph nodes  2D Echo  06/15/16 severe LVH. EF 55-60%. No valve abnormalities  Pan CT  No evidence of advanced malignancy  LDL 68   HgbA1c 5.5  UDS negative  lovenox therapeutic dose for VTE prophylaxis Diet NPO time specified  Eliquis (apixaban) daily prior to admission, now on lovenox therapeutic dose due to NPO status. Once passed swallow or have po access, can switch back to eliquis.  Patient counseled to be compliant with his antithrombotic medications  Ongoing aggressive stroke risk factor management  Therapy recommendations:  pending   Disposition:  pending  (from [facility])  Physical deconditioning, drooling and swallowing difficulty - ? Neuromuscular disorder vs. ? Hx of stroke  Admitted 2 weeks ago and concerning for neuromuscular disease  MG panel negative and mestinon trial negative  Recommended outpt EMG/NCS  Has appointment with Dr. [name] at [facility] on 07/07/16  Pt had left drooling intermittently for the last 2 years  Pt has swallow difficulty for 6 months  CT and MRI showed remote right IC genu infarct and right thalamic punctate infarcts  Wondering whether pt symptoms can be explained by remote infarcts.  Paroxysmal Atrial Fibrillation  Home anticoagulation:  Eliquis (apixaban) daily continued in the hospital  NPO status, failed MBS  On therapeutic lovenox now   Switch back to eliquis 5mg  bid once passed swallow or po access.  History of multiple cancers  Bladder cancer s/p Sx  Prostate cancer s/p Sx  Stomach lymphoma NHL s/p chemo  No recurrence as pt and family aware  Pan CT no evidence of  malignancy  Hypertension  Stable Permissive hypertension (OK if < 180/105) but gradually normalize in 5-7 days Long-term BP goal  normotensive  Hyperlipidemia  Home meds:  zocor 40,resumed in hospital  LDL 68, goal < 70  Statin on hold due to NPO status, resume once pass swallow or have po access  Continue statin at discharge  Other Stroke Risk Factors  Advanced age  Obesity, Body mass index is 31.75 kg/m., recommend weight loss, diet and exercise as appropriate   Other Active Problems  Recent community acquired PNA  BPH on flomax  Abnormal UA WBC 6-30  Normocytic normochronic anemia  GERD w/ H/O Barrett's esophagus  Pt has follow up appointment with Dr. [name] at [facility] on 07/07/16  Hospital day # 3 Plan continue panda tube feeding   Reassess swallow in few days and if no improvement may consider PEG   later  . Discussed with daughter and answered questions.D/w Dr Allyson Sabal. Stroke team will sign off. f/U with Dr [name] after  DC from rehab and postpone 07/07/16 appointment  Antony Contras, MD Stroke Neurology 07/04/2016 1:36 PM     To contact Stroke Continuity provider, please refer to [email or web address] After hours, contact General Neurology

## 2016-07-04 NOTE — Care Management Note (Signed)
Case Management Note  Patient Details  Name: Jason [name] MRN: [id number] Date of Birth: [birth year]  Subjective/Objective:                 Patient was admitted with CVA. Prior to admission, lived at home alone. CM will follow for discharge needs pending therapy evals and physician orders.    Action/Plan:   Expected Discharge Date:                  Expected Discharge Plan:     In-House Referral:     Discharge planning Services     Post Acute Care Choice:    Choice offered to:     DME Arranged:    DME Agency:     HH Arranged:    HH Agency:     Status of Service:     If discussed at [employer] of Stay Meetings, dates discussed:    Additional Comments:  Rolm Baptise, RN 07/04/2016, 10:45 AM

## 2016-07-04 NOTE — Progress Notes (Addendum)
Pt"s  NG tube is out, copious amount of Jevity in pt's nose, MD notified, NG tube will be left out until tomorrow. cortrak is gone for the day.

## 2016-07-04 NOTE — Consult Note (Signed)
   [facility] CM Inpatient Consult   07/04/2016  Jason [name] 05-Jun-1927 [id number]   Patient was assessed for re-admission. Chart review reveals the patient Jason [name] is a 83 y.o. male with medical history significant for Paroxysmal AFib on eliquis, GERD, Barrett's esophagus, bladder CA, non-Hodgkin's lymphoma, OA, hyperlipidemia who presented in ED on 06/30/16 with acute onset of slurred speech, right-sided facial droop, and right sided upper and lower extremity weakness. He had just been recently hospitalized from 3/14 -3/19 for community-acquired pneumonia. Upon discharge he was sent to a skilled nursing facility for rehabilitation. 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 No hemorrhage. Chart review reveals that the patient is planning to discharge to SNF for rehab. No community [facility] Care Management needs noted at this time.  For questions,  Natividad Brood, RN BSN [facility] Liaison  [phone] business mobile phone Toll free office [phone]

## 2016-07-04 NOTE — Progress Notes (Signed)
qPhysical Therapy Treatment Patient Details Name: Jason [name] MRN: [id number] DOB: [birth year] Today's Date: 07/04/2016    History of Present Illness Jason [name] is a 83 y.o. male with medical history significant for Paroxysmal AFib on eliquis, GERD, Barrett's esophagus, bladder CA, non-Hodgkin's lymphoma, OA, hyperlipidemia who presented in ED on 06/30/16 with acute onset of slurred speech, right-sided facial droop, and right sided upper and lower extremity weakness. He had just been recently hospitalized from 3/14 -3/19 for community-acquired pneumonia. Upon discharge he was sent to a skilled nursing facility for rehabilitation. 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 No hemorrhage.    PT Comments    Pt with increased assistance required throughout session today. Pt appeared to fatigue quickly with OOB mobility, and rely more on UE support on the walker for balance. Pt with several episodes of bowel incontinence during gait training, and used the commode x2 prior to making it to the chair. Stool appeared mostly clear liquid with no color associated. MD notified as he entered the room. At this time do not feel he will be able to tolerate the intense rehab that CIR offers, and PT now recommending return to SNF. Will continue to follow and progress as able per POC.    Follow Up Recommendations  SNF;Supervision/Assistance - 24 hour     Equipment Recommendations  None recommended by PT    Recommendations for Other Services Rehab consult     Precautions / Restrictions Precautions Precautions: Fall Restrictions Weight Bearing Restrictions: No    Mobility  Bed Mobility Overal bed mobility: Needs Assistance Bed Mobility: Supine to Sit     Supine to sit: Mod assist;HOB elevated     General bed mobility comments: pt transferred to sitting on L side of bed with use of bed rails and mod A at trunk with use of bed pads to achieve sitting  EOB  Transfers Overall transfer level: Needs assistance Equipment used: Rolling walker (2 wheeled) Transfers: Sit to/from Stand Sit to Stand: Min assist;+2 physical assistance         General transfer comment: increased time, VC'ing for bilateral hand placement and +2 min A to rise from bed, then later [facility].  Ambulation/Gait Ambulation/Gait assistance: Min assist Ambulation Distance (Feet): 100 Feet Assistive device: Rolling walker (2 wheeled) Gait Pattern/deviations: Step-through pattern;Decreased stride length;Trunk flexed Gait velocity: decreased Gait velocity interpretation: Below normal speed for age/gender General Gait Details: Pt with frequent cues to improve posture and attain a forward gaze. Decreased awareness of energy conservation. LE's getting obviously fatigued but pt stating he felt fine. Noted several instances of LLE buckling throughout gait training. Pt with stool incontinence in hall and urgent need to get to [facility].    Stairs            Wheelchair Mobility    Modified Rankin (Stroke Patients Only) Modified Rankin (Stroke Patients Only) Pre-Morbid Rankin Score: Moderately severe disability Modified Rankin: Moderately severe disability     Balance Overall balance assessment: Needs assistance Sitting-balance support: Feet supported;No upper extremity supported Sitting balance-Leahy Scale: Fair     Standing balance support: Bilateral upper extremity supported Standing balance-Leahy Scale: Poor Standing balance comment: pt reliant on bilateral UEs on RW                            Cognition Arousal/Alertness: Awake/alert Behavior During Therapy: WFL for tasks assessed/performed Overall Cognitive Status: Impaired/Different from baseline Area of  Impairment: Following commands;Safety/judgement;Awareness;Attention                   Current Attention Level: Sustained   Following Commands: Follows one step commands with increased  time Safety/Judgement: Decreased awareness of safety;Decreased awareness of deficits Awareness: Emergent          Exercises General Exercises - Lower Extremity Ankle Circles/Pumps: AROM;Both;20 reps;Seated Long Arc Quad: AROM;Strengthening;Both;10 reps;Seated Hip Flexion/Marching: AROM;Strengthening;Both;10 reps;Seated    General Comments        Pertinent Vitals/Pain Pain Assessment: No/denies pain Faces Pain Scale: Hurts a little bit Pain Intervention(s): Monitored during session    Home Living                      Prior Function            PT Goals (current goals can now be found in the care plan section) Acute Rehab PT Goals Patient Stated Goal: To get better PT Goal Formulation: With patient Time For Goal Achievement: 07/15/16 Potential to Achieve Goals: Good Progress towards PT goals: Progressing toward goals    Frequency    Min 3X/week      PT Plan Discharge plan needs to be updated    Co-evaluation             End of Session Equipment Utilized During Treatment: Gait belt Activity Tolerance: Patient tolerated treatment well Patient left: in chair;with call bell/phone within reach;with family/visitor present;with chair alarm set Nurse Communication: Mobility status PT Visit Diagnosis: Unsteadiness on feet (R26.81);Other abnormalities of gait and mobility (R26.89)     Time: 1015-1050 PT Time Calculation (min) (ACUTE ONLY): 35 min  Charges:  $Gait Training: 23-37 mins                    G Codes:  Functional Assessment Tool Used: AM-PAC 6 Clicks Basic Mobility Functional Limitation: Mobility: Walking and moving around Mobility: Walking and Moving Around Current Status (T0626): At least 40 percent but less than 60 percent impaired, limited or restricted Mobility: Walking and Moving Around Goal Status [id number]): At least 20 percent but less than 40 percent impaired, limited or restricted    Rolinda Roan, PT, DPT Acute Rehabilitation  Services Pager: [place] 07/04/2016, 2:41 PM

## 2016-07-05 ENCOUNTER — Inpatient Hospital Stay (HOSPITAL_COMMUNITY): Payer: Medicare Other

## 2016-07-05 LAB — CBC
HCT: 34.9 % — ABNORMAL LOW (ref 39.0–52.0)
Hemoglobin: 11.3 g/dL — ABNORMAL LOW (ref 13.0–17.0)
MCH: 27.3 pg (ref 26.0–34.0)
MCHC: 32.4 g/dL (ref 30.0–36.0)
MCV: 84.3 fL (ref 78.0–100.0)
PLATELETS: 138 10*3/uL — AB (ref 150–400)
RBC: 4.14 MIL/uL — ABNORMAL LOW (ref 4.22–5.81)
RDW: 13.8 % (ref 11.5–15.5)
WBC: 6.6 10*3/uL (ref 4.0–10.5)

## 2016-07-05 LAB — COMPREHENSIVE METABOLIC PANEL
ALBUMIN: 2.6 g/dL — AB (ref 3.5–5.0)
ALK PHOS: 25 U/L — AB (ref 38–126)
ALT: 19 U/L (ref 17–63)
ANION GAP: 6 (ref 5–15)
AST: 34 U/L (ref 15–41)
BILIRUBIN TOTAL: 0.6 mg/dL (ref 0.3–1.2)
BUN: 18 mg/dL (ref 6–20)
CALCIUM: 8.3 mg/dL — AB (ref 8.9–10.3)
CO2: 23 mmol/L (ref 22–32)
Chloride: 108 mmol/L (ref 101–111)
Creatinine, Ser: 0.88 mg/dL (ref 0.61–1.24)
GFR calc non Af Amer: >60 mL/min (ref >60)
Glucose, Bld: 98 mg/dL (ref 65–99)
POTASSIUM: 3.2 mmol/L — AB (ref 3.5–5.1)
SODIUM: 137 mmol/L (ref 135–145)
TOTAL PROTEIN: 5.7 g/dL — AB (ref 6.5–8.1)

## 2016-07-05 LAB — GLUCOSE, CAPILLARY
GLUCOSE-CAPILLARY: 120 mg/dL — AB (ref 65–99)
GLUCOSE-CAPILLARY: 82 mg/dL (ref 65–99)
GLUCOSE-CAPILLARY: 88 mg/dL (ref 65–99)
Glucose-Capillary: 102 mg/dL — ABNORMAL HIGH (ref 65–99)
Glucose-Capillary: 92 mg/dL (ref 65–99)
Glucose-Capillary: 103 mg/dL — ABNORMAL HIGH (ref 65–99)
Glucose-Capillary: 102 mg/dL — ABNORMAL HIGH (ref 65–99)

## 2016-07-05 MED ORDER — RESOURCE THICKENUP CLEAR PO POWD
ORAL | Status: DC | PRN
  Filled 2016-07-05: qty 125

## 2016-07-05 MED ORDER — DEXTROSE-NACL 5-0.45 % IV SOLN
INTRAVENOUS | Status: DC
  Administered 2016-07-05: 02:00:00 via INTRAVENOUS

## 2016-07-05 NOTE — Clinical Social Work Note (Signed)
Clinical Social Work Assessment  Patient Details  Name: Jason [name] MRN: [id number] Date of Birth: Aug 03, 1927  Date of referral:  07/05/16               Reason for consult:  Discharge Planning                Permission sought to share information with:  Facility Sport and exercise psychologist, Family Supports Permission granted to share information::  Yes, Verbal Permission Granted  Name::     Peggye Form  Agency::  [employer]  Relationship::  Daughter  Contact Information:  [phone]  Housing/Transportation Living arrangements for the past 2 months:  [place] of Information:  Patient, Medical Team, Adult Children Patient Interpreter Needed:  None Criminal Activity/Legal Involvement Pertinent to Current Situation/Hospitalization:  No - Comment as needed Significant Relationships:  Adult Children Lives with:  Facility Resident Do you feel safe going back to the place where you live?  Yes Need for family participation in patient care:  Yes (Comment)  Care giving concerns:  Patient admitted from [facility]. PT recommending SNF once medically stable for discharge.   Social Worker assessment / plan:  CSW met with patient. Daughter at bedside. CSW introduced role and explained that PT recommendations would be discussed. Patient and his daughter confirmed that he was admitted from [facility] and the plan is for him to return once stable for discharge. Discussed possibility of insurance not covering PTAR as patient is walking 100 feet and is not on oxygen. Will decide whether family will transport of if PTAR will be used on day of discharge. No further concerns. CSW encouraged patient and his daughter to contact CSW as needed. CSW will continue to follow patient and his daughter for support and facilitate discharge back to [facility] once medically stable for discharge.  Employment status:  Retired Forensic scientist:   Medicare PT Recommendations:  [place] / Referral to community resources:  [place]  Patient/Family's Response to care:  Patient and his daughter agreeable to return to SNF. Patient's daughter supportive and involved inpatient's care. Patient and his daughter appreciated social work intervention.  Patient/Family's Understanding of and Emotional Response to Diagnosis, Current Treatment, and Prognosis:  Patient and his daughter have a good understanding of the reason for admission. Patient and his daughter appear happy with hospital care.  Emotional Assessment Appearance:  Appears stated age Attitude/Demeanor/Rapport:  Other (Pleasant) Affect (typically observed):  Accepting, Appropriate, Calm, Pleasant Orientation:  Oriented to Self, Oriented to Place, Oriented to  Time, Oriented to Situation Alcohol / Substance use:  Never Used Psych involvement (Current and /or in the community):  No (Comment)  Discharge Needs  Concerns to be addressed:  Care Coordination Readmission within the last 30 days:  Yes Current discharge risk:  Dependent with Mobility Barriers to Discharge:  Continued Medical Work up   Candie Chroman, LCSW 07/05/2016, 4:34 PM

## 2016-07-05 NOTE — NC FL2 (Signed)
[place] LEVEL OF CARE SCREENING TOOL     IDENTIFICATION  Patient Name: Jason [name] Birthdate: 08-20-1927 Sex: male Admission Date (Current Location): 06/30/2016  [facility] and [place] Number:  Herbalist and Address:  [facility], [place] [address], [place], [place] [address]      Provider Number: [id number]  Attending Physician Name and Address:  Reyne Dumas, MD  Relative Name and Phone Number:       Current Level of Care: Hospital Recommended Level of Care: [place] Prior Approval Number:    Date Approved/Denied:   PASRR Number: [id number]  Discharge Plan: SNF    Current Diagnoses: Patient Active Problem List   Diagnosis Date Noted  . CVA (cerebral vascular accident) ([place]) 07/01/2016  . Dysarthria   . History of cancer   . TIA (transient ischemic attack) 06/30/2016  . Hemiplegia affecting dominant side ([place]) 06/30/2016  . History of bladder cancer   . Dysphagia   . Hyperglycemia   . Stage 3 chronic kidney disease   . Acute blood loss anemia   . Leukocytosis   . Weakness   . Community acquired pneumonia 06/16/2016  . Unresponsiveness   . CAP (community acquired pneumonia) 06/15/2016  . PAF (paroxysmal atrial fibrillation) ([place]) 06/15/2016  . Pressure injury of skin 06/15/2016  . Lobar pneumonia ([place])   . Acute congestive heart failure ([place])   . Primary osteoarthritis of left knee 07/30/2015  . BPH (benign prostatic hyperplasia) 07/16/2012  . Multiple nodules of lung 09/16/2011  . GERD (gastroesophageal reflux disease) 06/14/2011  . Constipation, chronic 06/14/2011  . NHL (non-Hodgkin's lymphoma) ([place]) 01/03/2010  . Malignant neoplasm of bladder ([place]) 05/28/2007  . Hyperlipidemia 05/28/2007  . HIATAL HERNIA 05/28/2007  . BARRETT'S ESOPHAGUS, HX OF 05/28/2007    Orientation RESPIRATION BLADDER Height & Weight     Self, Time, Situation, Place  Normal Incontinent Weight: 235 lb (106.6  kg) Height:  5\' 10"  (177.8 cm)  BEHAVIORAL SYMPTOMS/MOOD NEUROLOGICAL BOWEL NUTRITION STATUS   (None)  (CVA) Continent Diet (DYS 1, Fluid honey thick: LIQUIDS BY TEASPOON ONLY, no straw, pills crushed, sit upright, stay upright 30 min after meals, swallow 2 times, cue to clear throat during meals)  AMBULATORY STATUS COMMUNICATION OF NEEDS Skin   Limited Assist Verbally Bruising                       Personal Care Assistance Level of Assistance  Bathing, Feeding, Dressing Bathing Assistance: Limited assistance Feeding assistance: Limited assistance Dressing Assistance: Limited assistance     Functional Limitations Info  Sight, Hearing, Speech Sight Info: Adequate Hearing Info: Adequate Speech Info: Adequate    SPECIAL CARE FACTORS FREQUENCY  PT (By licensed PT), Speech therapy, OT (By licensed OT)     PT Frequency: 5 x week       Speech Therapy Frequency: 2 x week      Contractures Contractures Info: Not present    Additional Factors Info  Code Status, Allergies Code Status Info: Full Allergies Info: NKDA           Current Medications (07/05/2016):  This is the current hospital active medication list Current Facility-Administered Medications  Medication Dose Route Frequency Provider Last Rate Last Dose  . acetaminophen (TYLENOL) tablet 650 mg  650 mg Oral Q4H PRN Norval Morton, MD       Or  . acetaminophen (TYLENOL) solution 650 mg  650 mg Per Tube  Q4H PRN Norval Morton, MD       Or  . acetaminophen (TYLENOL) suppository 650 mg  650 mg Rectal Q4H PRN Rondell A Tamala Julian, MD      . dextrose 5 %-0.45 % sodium chloride infusion   Intravenous Continuous Rhetta Mura Schorr, NP 75 mL/hr at 07/05/16 0208    . enoxaparin (LOVENOX) injection 100 mg  1 mg/kg Subcutaneous Q12H Reyne Dumas, MD   100 mg at 07/05/16 1038  . feeding supplement (JEVITY 1.2 CAL) liquid 1,000 mL  1,000 mL Per Tube Continuous Reyne Dumas, MD 65 mL/hr at 07/04/16 0503 1,000 mL at 07/04/16 0503   . free water 200 mL  200 mL Per Tube Q6H Reyne Dumas, MD   200 mL at 07/04/16 0549  . guaiFENesin (MUCINEX) 12 hr tablet 600 mg  600 mg Oral BID Norval Morton, MD   600 mg at 07/03/16 2113  . ipratropium-albuterol (DUONEB) 0.5-2.5 (3) MG/3ML nebulizer solution 3 mL  3 mL Nebulization Q4H PRN Norval Morton, MD      . loratadine (CLARITIN) tablet 10 mg  10 mg Oral Daily PRN Norval Morton, MD      . pantoprazole (PROTONIX) EC tablet 40 mg  40 mg Oral BID Norval Morton, MD   40 mg at 07/04/16 1007  . polyvinyl alcohol (LIQUIFILM TEARS) 1.4 % ophthalmic solution 1 drop  1 drop Both Eyes Daily PRN Norval Morton, MD   1 drop at 07/04/16 0058  . RESOURCE THICKENUP CLEAR   Oral PRN Reyne Dumas, MD      . senna-docusate (Senokot-S) tablet 1 tablet  1 tablet Oral QHS PRN Norval Morton, MD   1 tablet at 07/03/16 0926  . simvastatin (ZOCOR) tablet 40 mg  40 mg Oral QHS Norval Morton, MD   40 mg at 07/03/16 2113  . tamsulosin (FLOMAX) capsule 0.4 mg  0.4 mg Oral QHS Norval Morton, MD   0.4 mg at 07/03/16 2113  . traZODone (DESYREL) tablet 50 mg  50 mg Oral QHS PRN Norval Morton, MD   50 mg at 07/04/16 0046     Discharge Medications: Please see discharge summary for a list of discharge medications.  Relevant Imaging Results:  Relevant Lab Results:   Additional Information SS#: [id number]  Candie Chroman, LCSW

## 2016-07-05 NOTE — Care Management Important Message (Signed)
Important Message  Patient Details  Name: Jason [name] MRN: [id number] Date of Birth: [birth year]   Medicare Important Message Given:  Yes    [name] 07/05/2016, 2:25 PM

## 2016-07-05 NOTE — Progress Notes (Signed)
[place] Team paged. Spoke to dietitian this AM. Staff will replace tube. Also waiting for speech to eval if needed.   Ave Filter, RN

## 2016-07-05 NOTE — Progress Notes (Signed)
Pt's eating and drinking at this time. Blood sugar steadily increasing. MD ok to d/c dextrose IVF. Will continue to monitor.   Ave Filter, RN

## 2016-07-05 NOTE — Progress Notes (Addendum)
[facility] PROGRESS NOTE  Jason [name] ACZ:[id number] DOB: [birth year] DOA: 06/30/2016   PCP: Melinda Crutch, MD     Assessment/Plan: Principal Problem:   Hemiplegia affecting dominant side ([place]) Active Problems:   Hyperlipidemia   GERD (gastroesophageal reflux disease)   BPH (benign prostatic hyperplasia)   PAF (paroxysmal atrial fibrillation) (H[facility])   Dysphagia   CVA (cerebral vascular accident) ([place])   Dysarthria   History of cancer   83 y.o. right handed male with medical history significant of PAF on Eliquis, Barrett's esophagus, bladder cancer, non-Hodgkin's lymphoma, OA, and HLD;  who presents with acute onset of slurred speech, right-sided facial droop, and right sided upper and lower extremity weakness. He had just been recently hospitalized from 3/14 -3/19 for community-acquired pneumonia. During hospitalization he had an event of unresponsiveness with bradycardia, but cardiology felt it was noncardiac in nature. Patient had negative MRI, MRA, and EEG studies. Due to swallowing difficulties patient was evaluated by speech therapy and seen to be silently aspirating.Upon discharge he was sent to a skilled nursing facility for rehabilitation and completed a full course of Levaquin on 3/21.  3/29  he was noted to have a sudden right facial droop, worsening of dysarthria and right leg plegia. Neurology was called and patient was brought for a emergent CT perfusion to evaluate for M1 stenosis or occlusion. MRI showed acute stroke. Patient has significant dysphagia, was NPO. Patient may need PEG tube during this hospitalization, if he continues to have swallowing issues    Assessment/Plan  Stroke:   L paramedian pontine infarct secondary to small vessel disease source. However, pt has pAfib on eliquis    CTA head/neck no ELVO. Atherosclerosis. Multilevel spondylosis  CT Perfusion negative. No acute infarct  MRI  acute L paramedian pontine infarct. small vessel disease.    LE venous doppler 06/15/16 NO DVT. R&L enlarged lymph nodes  2D Echo  06/15/16 severe LVH. EF 55-60%. No valve abnormalities  Pan CT negative for malignancy,No recurrence of lymphadenopathy identified within chest, abdomen or pelvis  LDL 68 ,Statin via  cor trak tube    HgbA1c 5.5  UDS negative  lovenox therapeutic dose ,  Npo as per speech therapy, had  Cortrak  tube which  fell out 4/2,  Not Replaced 4/3 , as daughter states that MBS was successful and he may be started on a diet   may need to consider PEG tube later in the week if swallowing issues continue ,IR requested to evaluate eligibility for PEG   Eliquis (apixaban) daily prior to admission, now on lovenox therapeutic dose due to NPO status.  Once passes swallow or have po established, can switch back to eliquis.  therapy recommendations: SNF [facility]  Pt has follow up appointment with Dr. [name] at [facility] on 07/07/16   Physical deconditioning, drooling and swallowing difficulty - ? Neuromuscular disorder vs. ? Hx of stroke Admitted 2 weeks ago and concerning for neuromuscular disease MG panel negative  Recommended outpt EMG/NCS Has appointment with Dr. [name] at [facility] on 07/07/16 Pt had left drooling intermittently for the last 2 years Pt has swallow difficulty for 6 months CT and MRI showed remote right IC genu infarct and right thalamic punctate infarcts Pan CT  No evidence of advanced malignancy or recurrence of lymphoma   Previous diet recommendations from hospitalization on 3/14:  Previously on Dysphagia 3 (mechanical soft);Thin liquid Currently nothing by mouth    Recent community-acquired pneumonia: Chest x-ray overall improved. -  Continue Mucinex, completed Levaquin  Paroxysmal atrial fibrillation: Chadsvasc score = 5 - Continue Eliquis when by mouth intake established  Hyperlipidemia - Continue simvastatin  BPH - Continue Flomax    Abnormal UA Urine culture insignificant  Normocytic  normochromic anemia: Stable - Continue to monitor   GERD with H/O Barrett's esophagus - Pharmacy substitution of Protonix for Nexium   obesity, Body mass index is 31.75 kg/m., recommend weight loss, diet and exercise as appropriate    DVT prophylaxsis Lovenox >eliquis  Code Status:   Full code   Family Communication: Discussed in detail with the patient/ daughter , all imaging results, lab results explained to the patient   Disposition Plan:   Discharge barriers primarily his dysphagia. May need   PEG tube by the end of this week if no improvement  , of eating ok SNF in 1-2 days      Consultants: Neurology  Procedures: None  Antibiotics: Anti-infectives    None         HPI/Subjective: Speech is improving , daughter states that he talked to some family members on the phone this morning    Objective: Vitals:   07/05/16 0007 07/05/16 0442 07/05/16 0549 07/05/16 0922  BP: (!) 134/51 (!) 153/66  (!) 141/71  Pulse: 61 (!) 59  67  Resp: 18 18  18   Temp: 98.9 F (37.2 C) 97.8 F (36.6 C)  98.3 F (36.8 C)  TempSrc: Oral Oral  Oral  SpO2: 95% 95%  100%  Weight:   106.6 kg (235 lb)   Height:        Intake/Output Summary (Last 24 hours) at 07/05/16 1304 Last data filed at 07/05/16 5462  Gross per 24 hour  Intake              265 ml  Output              950 ml  Net             -685 ml    Exam:  Examination:  General exam: Appears calm and comfortable  Respiratory system: Clear to auscultation. Respiratory effort normal. Cardiovascular system: S1 & S2 heard, RRR. No JVD, murmurs, rubs, gallops or clicks. No pedal edema. Gastrointestinal system: Abdomen is nondistended, soft and nontender. No organomegaly or masses felt. Normal bowel sounds heard. Central nervous system:   dysarthria,right nasolabial fold flattening Extremities: Symmetric 5 x 5 power. Skin: No rashes, lesions or ulcers Psychiatry: Judgement and insight appear normal. Mood & affect  appropriate.     Data Reviewed: I have personally reviewed following labs and imaging studies  Micro Results Recent Results (from the past 240 hour(s))  Culture, Urine     Status: Abnormal   Collection Time: 06/30/16  5:30 PM  Result Value Ref Range Status   Specimen Description URINE, RANDOM  Final   Special Requests NONE  Final   Culture <10,000 COLONIES/mL INSIGNIFICANT GROWTH (A)  Final   Report Status 07/02/2016 FINAL  Final    Radiology Reports Ct Angio Head W Or Wo Contrast  Result Date: 06/30/2016 CLINICAL DATA:  New onset right-sided facial droop and intermittent dysarthria over the last 2 days. EXAM: CT ANGIOGRAPHY HEAD AND NECK TECHNIQUE: Multidetector CT imaging of the head and neck was performed using the standard protocol during bolus administration of intravenous contrast. Multiplanar CT image reconstructions and MIPs were obtained to evaluate the vascular anatomy. Carotid stenosis measurements (when applicable) are obtained utilizing NASCET criteria, using the distal internal carotid  diameter as the denominator. CONTRAST:  50 mL Isovue 370 COMPARISON:  CT head without contrast from the same day. MRI brain 06/16/2016 FINDINGS: CTA NECK FINDINGS Aortic arch: A 3 vessel arch configuration is present. Atherosclerotic changes are present at the aortic arch without significant stenosis at the great vessel origins. There is no aneurysm. Right carotid system: Common carotid artery is tortuous proximally. There is no focal stenosis. Atherosclerotic calcifications are present at the right carotid bifurcation without significant stenosis relative to the more distal vessel. Remainder of the right internal carotid artery is normal. Left carotid system: The left common carotid artery is tortuous proximally. There is no significant stenosis. Atherosclerotic changes are present at the carotid bifurcation without significant stenosis. The more distal left internal carotid artery is normal.  Vertebral arteries: Both vertebral arteries originate from the subclavian arteries without significant stenoses. The vertebral arteries are codominant. There is no significant stenosis or vascular injury to either vertebral artery in the neck. Skeleton: Mild endplate degenerative and facet changes are present. No focal lytic or blastic lesions are present. There is exaggerated thoracic kyphosis and cervical lordosis. Other neck: No focal mucosal or submucosal lesions are present. Bilateral thyroid nodules are present without a dominant lesion. There is no significant adenopathy. The salivary glands are within normal limits. Upper chest: The lung apices demonstrate bilateral dependent atelectasis. No focal nodule or mass lesion is present. Review of the MIP images confirms the above findings CTA HEAD FINDINGS Anterior circulation: Atherosclerotic calcifications are present within the cavernous internal carotid arteries bilaterally without a significant stenosis through the ICA terminus. The A1 and M1 segments are normal. The anterior communicating artery is patent. The MCA bifurcations are intact. There is segmental attenuation of ACA and MCA branch vessels bilaterally without a significant proximal stenosis or occlusion. Posterior circulation: The vertebral arteries are codominant. Atherosclerotic changes are present at the dural margin. The PICA origins are visualized and normal. The basilar artery is normal. Both posterior cerebral arteries originate from the basilar tip. There is no significant proximal stenosis or occlusion within either posterior cerebral artery. Venous sinuses: The dural sinuses are patent. The right transverse sinus is dominant. The straight sinus deep cerebral veins are intact. The cortical veins are unremarkable. Anatomic variants: None Delayed phase: Not performed. Arterial source images are unremarkable. Review of the MIP images confirms the above findings IMPRESSION: 1. No emerging large  vessel occlusion. 2. Atherosclerotic changes at the aortic arch, carotid bifurcations, dural margin of vertebral arteries, and cavernous internal carotid arteries without significant stenoses. 3. Mild diffuse segmental branch vessel irregularity within both the anterior and posterior circulation suggesting intracranial atherosclerotic change. There is no significant proximal stenosis or occlusion. 4. Multilevel spondylosis of the cervical spine. Electronically Signed   By: San Morelle M.D.   On: 06/30/2016 17:03   Dg Chest 2 View  Result Date: 06/30/2016 CLINICAL DATA:  Cough, recent episode of pneumonia, stroke symptoms. History of bladder malignancy EXAM: CHEST  2 VIEW COMPARISON:  Chest x-ray of June 15, 2016 FINDINGS: The lungs are adequately inflated. The interstitial markings are coarse in the mid and lower lung zones but have improved in the upper lobes. The cardiac silhouette remains enlarged. The pulmonary vascularity is not engorged. There is no pleural effusion. There is calcification in the wall of the thoracic aorta. There is multilevel degenerative disc disease of the thoracic spine. IMPRESSION: There has been some improvement in the appearance of the lungs especially upper lobes but coarse interstitial markings persist  in the lower lobes. There is no discrete pneumonia. There is stable cardiomegaly without pulmonary vascular congestion. Thoracic aortic atherosclerosis. Electronically Signed   By: David  Martinique M.D.   On: 06/30/2016 15:47   Dg Chest 2 View  Result Date: 06/15/2016 CLINICAL DATA:  Dizziness and confusion EXAM: CHEST  2 VIEW COMPARISON:  Aug 09, 2013 FINDINGS: There is cardiomegaly with mild pulmonary venous hypertension. There is mild interstitial edema in the lung bases. There is focal airspace consolidation in the medial left base. Lungs elsewhere clear. No adenopathy. No bone lesions. IMPRESSION: Findings consistent with a degree of congestive heart failure. Suspect  superimposed pneumonia medial left base. Followup PA and lateral chest radiographs recommended in 3-4 weeks following trial of antibiotic therapy to ensure resolution and exclude underlying malignancy. Electronically Signed   By: Lowella Grip III M.D.   On: 06/15/2016 07:58   Ct Head Wo Contrast  Result Date: 06/15/2016 CLINICAL DATA:  Lower extremity weakness.  Fall. EXAM: CT HEAD WITHOUT CONTRAST TECHNIQUE: Contiguous axial images were obtained from the base of the skull through the vertex without intravenous contrast. COMPARISON:  Head CT November 15, 2015 FINDINGS: Brain: Moderate diffuse atrophy is stable. There is no intracranial mass, hemorrhage, extra-axial fluid collection, or midline shift. There is mild patchy small vessel disease in the centra semiovale bilaterally. Elsewhere gray-white compartments appear normal. No acute infarct evident. Vascular: No hyperdense vessel. There are foci of calcification in each carotid siphon region as well as in each distal vertebral artery. Skull: The bony calvarium appears intact. Sinuses/Orbits: There is mucosal thickening and opacification in multiple ethmoid air cells. There is mucosal thickening in the anteromedial right sphenoid sinus. Visualized paranasal sinuses elsewhere are clear. Visualized orbits appear symmetric bilaterally with the exception of apparent cataract removal on the right. Other: Mastoid air cells are clear. IMPRESSION: Atrophy with relatively mild periventricular small vessel disease, stable. No intracranial mass, hemorrhage, or extra-axial fluid collection. No acute appearing infarct. Areas of arterial vascular calcification noted. There is paranasal sinus disease, primarily in multiple ethmoid air cells. Electronically Signed   By: Lowella Grip III M.D.   On: 06/15/2016 08:10   Ct Angio Neck W Or Wo Contrast  Result Date: 06/30/2016 CLINICAL DATA:  New onset right-sided facial droop and intermittent dysarthria over the last 2  days. EXAM: CT ANGIOGRAPHY HEAD AND NECK TECHNIQUE: Multidetector CT imaging of the head and neck was performed using the standard protocol during bolus administration of intravenous contrast. Multiplanar CT image reconstructions and MIPs were obtained to evaluate the vascular anatomy. Carotid stenosis measurements (when applicable) are obtained utilizing NASCET criteria, using the distal internal carotid diameter as the denominator. CONTRAST:  50 mL Isovue 370 COMPARISON:  CT head without contrast from the same day. MRI brain 06/16/2016 FINDINGS: CTA NECK FINDINGS Aortic arch: A 3 vessel arch configuration is present. Atherosclerotic changes are present at the aortic arch without significant stenosis at the great vessel origins. There is no aneurysm. Right carotid system: Common carotid artery is tortuous proximally. There is no focal stenosis. Atherosclerotic calcifications are present at the right carotid bifurcation without significant stenosis relative to the more distal vessel. Remainder of the right internal carotid artery is normal. Left carotid system: The left common carotid artery is tortuous proximally. There is no significant stenosis. Atherosclerotic changes are present at the carotid bifurcation without significant stenosis. The more distal left internal carotid artery is normal. Vertebral arteries: Both vertebral arteries originate from the subclavian arteries without significant stenoses.  The vertebral arteries are codominant. There is no significant stenosis or vascular injury to either vertebral artery in the neck. Skeleton: Mild endplate degenerative and facet changes are present. No focal lytic or blastic lesions are present. There is exaggerated thoracic kyphosis and cervical lordosis. Other neck: No focal mucosal or submucosal lesions are present. Bilateral thyroid nodules are present without a dominant lesion. There is no significant adenopathy. The salivary glands are within normal limits.  Upper chest: The lung apices demonstrate bilateral dependent atelectasis. No focal nodule or mass lesion is present. Review of the MIP images confirms the above findings CTA HEAD FINDINGS Anterior circulation: Atherosclerotic calcifications are present within the cavernous internal carotid arteries bilaterally without a significant stenosis through the ICA terminus. The A1 and M1 segments are normal. The anterior communicating artery is patent. The MCA bifurcations are intact. There is segmental attenuation of ACA and MCA branch vessels bilaterally without a significant proximal stenosis or occlusion. Posterior circulation: The vertebral arteries are codominant. Atherosclerotic changes are present at the dural margin. The PICA origins are visualized and normal. The basilar artery is normal. Both posterior cerebral arteries originate from the basilar tip. There is no significant proximal stenosis or occlusion within either posterior cerebral artery. Venous sinuses: The dural sinuses are patent. The right transverse sinus is dominant. The straight sinus deep cerebral veins are intact. The cortical veins are unremarkable. Anatomic variants: None Delayed phase: Not performed. Arterial source images are unremarkable. Review of the MIP images confirms the above findings IMPRESSION: 1. No emerging large vessel occlusion. 2. Atherosclerotic changes at the aortic arch, carotid bifurcations, dural margin of vertebral arteries, and cavernous internal carotid arteries without significant stenoses. 3. Mild diffuse segmental branch vessel irregularity within both the anterior and posterior circulation suggesting intracranial atherosclerotic change. There is no significant proximal stenosis or occlusion. 4. Multilevel spondylosis of the cervical spine. Electronically Signed   By: San Morelle M.D.   On: 06/30/2016 17:03   Ct Chest W Contrast  Result Date: 07/01/2016 CLINICAL DATA:  Bladder cancer, non-Hodgkin's  lymphoma and Barrett's esophagus. EXAM: CT CHEST, ABDOMEN, AND PELVIS WITH CONTRAST TECHNIQUE: Multidetector CT imaging of the chest, abdomen and pelvis was performed following the standard protocol during bolus administration of intravenous contrast. CONTRAST:  166mL ISOVUE-300 IOPAMIDOL (ISOVUE-300) INJECTION 61% COMPARISON:  CXR from 06/30/2016, chest CT from 09/09/2011, CT chest, abdomen and pelvis from 09/09/2011 FINDINGS: CT CHEST FINDINGS Cardiovascular: Undo top normal size cardiac chambers without pericardial effusion. No large central pulmonary embolus. Aortic atherosclerosis without aneurysm. No dissection. Mild ectasia of the ascending aorta as before up to 3.9 cm. Normal branch pattern for the great vessels. Mediastinum/Nodes: Stable subcentimeter left-sided thyroid nodules since 2013 consistent with benign findings. The largest currently is approximately 12 mm. Fat containing right lower paratracheal lymph node measuring up to 13 mm is without significant change. Stable hilar lymph nodes, the largest on the right approximately 9 mm short axis and on the left, 8 mm short axis. Lungs/Pleura: Stable calcified and noncalcified subcentimeter pulmonary nodules dating back to 2013 consistent with benign findings, index nodule the left upper lobe measuring 7 mm, series 5, image 76. Bibasilar dependent atelectasis. There is lingular, right middle lobe and both lower lobe bronchiectasis with mild peribronchial thickening and chronic minimal parenchymal scarring and/or atelectasis. Musculoskeletal: Lower thoracic kyphosis attributable to multilevel degenerative disc disease. Small osteophytes are noted along the anterior aspect of the mid and lower thoracic spine. No acute nor suspicious osseous abnormalities. CT ABDOMEN PELVIS FINDINGS  Hepatobiliary: Status post cholecystectomy. No space-occupying mass of the liver. No biliary dilatation. Pancreas: Unremarkable. No pancreatic ductal dilatation or surrounding  inflammatory changes. Spleen: Normal in size without focal abnormality. Adrenals/Urinary Tract: Adrenal glands are unremarkable. Kidneys are normal, without renal calculi, focal lesion, or hydronephrosis. Bladder is unremarkable. Stomach/Bowel: Stomach is within normal limits. Appendix is surgically absent by report. No evidence of bowel wall thickening, distention, or inflammatory changes. Vascular/Lymphatic: Bandlike mesenteric soft tissue attenuation with calcification in the mid abdomen is slightly more prominent than on prior consistent with treated lymphoma. Small scattered mesenteric lymph nodes are seen in the right lower quadrant and root of the mesentery as before. No inguinal or pelvic sidewall adenopathy. No retroperitoneal or mesenteric lymphadenopathy. No porta hepatic nor epigastric enlargement of lymph nodes Reproductive: Prostate is unremarkable. Other: No abdominal wall hernia or abnormality. No abdominopelvic ascites. Musculoskeletal: No acute or significant osseous findings. Degenerative disc disease L4-5. IMPRESSION: 1. Chronic since 2013 are stable pulmonary nodules noted bilaterally, index nodule the left upper lobe measures 7 mm consistent with benign findings. 2. Hazy mesenteric fat in the mid abdomen with calcification consistent with treated non-Hodgkin's lymphoma. No recurrence of lymphadenopathy identified within chest, abdomen or pelvis. 3. Cholecystectomy. Electronically Signed   By: Ashley Royalty M.D.   On: 07/01/2016 20:12   Mr Brain Wo Contrast  Result Date: 07/01/2016 CLINICAL DATA:  Acute presentation with slurred speech and right-sided facial droop. Right upper and lower extremity weakness. EXAM: MRI HEAD WITHOUT CONTRAST TECHNIQUE: Multiplanar, multiecho pulse sequences of the brain and surrounding structures were obtained without intravenous contrast. COMPARISON:  Multiple CT studies done yesterday.  MRI 06/16/2016. FINDINGS: Brain: There is acute infarction affecting the  left para median pons. No acute infarction affecting the cerebral hemispheres. No focal cerebellar insult. Few old small vessel infarctions within the thalami I. Moderate chronic small-vessel ischemic changes of the cerebral hemispheric white matter. No cortical or large vessel territory infarction. No mass lesion, hemorrhage, hydrocephalus or extra-axial collection. Vascular: Major vessels at the base of the brain show flow. Skull and upper cervical spine: Negative Sinuses/Orbits: Clear except for mild maxillary sinus mucosal thickening and retention cyst. Orbits negative. Other: None significant IMPRESSION: Acute infarction affecting the left para median pons. No hemorrhage. Chronic small-vessel ischemic changes elsewhere as outlined above. Electronically Signed   By: Nelson Chimes M.D.   On: 07/01/2016 07:11   Mr Brain Wo Contrast  Result Date: 06/16/2016 CLINICAL DATA:  83 y/o  M; generalized weakness and fall. EXAM: MRI HEAD WITHOUT CONTRAST TECHNIQUE: Multiplanar, multiecho pulse sequences of the brain and surrounding structures were obtained without intravenous contrast. COMPARISON:  06/15/2016 CT of the head. FINDINGS: Brain: No acute infarction, hemorrhage, hydrocephalus, extra-axial collection or mass lesion. Foci of T2 FLAIR hyperintense signal abnormality in subcortical and periventricular white matter are nonspecific are compatible with moderate chronic microvascular ischemic changes of the brain. Mild brain parenchymal volume loss for age. Small chronic lacunar infarct within the left caudate head and hemosiderin stained chronic lacunar infarct in right lentiform nucleus. Vascular: Normal flow voids. Skull and upper cervical spine: Normal marrow signal. Sinuses/Orbits: Mild ethmoid and maxillary sinus mucosal thickening and large right maxillary sinus mucous retention cyst. No abnormal signal of the mastoid air cells. Right intra-ocular lens replacement. Other: None. IMPRESSION: 1. No acute  intracranial abnormality. 2. Moderate chronic microvascular ischemic changes and mild parenchymal volume loss of the brain. Small chronic lacunar infarcts within right lentiform nucleus and left caudate head. 3. Mild paranasal  sinus disease. Electronically Signed   By: Kristine Garbe M.D.   On: 06/16/2016 19:20   Mr Cervical Spine Wo Contrast  Result Date: 06/17/2016 CLINICAL DATA:  Progressive weakness.  Drooling. EXAM: MRI CERVICAL SPINE WITHOUT CONTRAST TECHNIQUE: Multiplanar, multisequence MR imaging of the cervical spine was performed. No intravenous contrast was administered. COMPARISON:  None. FINDINGS: Alignment: Exaggerated lordosis.  No listhesis. Vertebrae: No fracture, evidence of discitis, or bone lesion. Mild marrow edema about the left C4-5 facet, degenerative appearing. Cord: Normal signal and morphology. Posterior Fossa, vertebral arteries, paraspinal tissues: Bilateral thyroid nodules, up to 12 mm on the left, below size threshold for recommend sonographic follow-up. Disc levels: C2-3: Facet arthropathy with moderate spurring on the left. Ligamentum flavum thickening. Patent canal and foramina C3-4: Facet arthropathy with spurring greater on the left. Ligamentum flavum thickening. Mild endplate ridging. Patent canal and foramina C4-5: Facet arthropathy with moderate bilateral spurring. Ligamentum flavum thickening. No impingement C5-6: Facet arthropathy with mild spurring. Ligamentum flavum thickening. Disc narrowing with posterior disc osteophyte complex. No impingement C6-7: Mild facet spurring.  Mild disc narrowing.  No impingement C7-T1:Mild facet arthropathy with possible ankylosis on the right. Mild disc narrowing. No impingement IMPRESSION: 1. No explanation for weakness. The canal and foramina are diffusely patent. No evidence of myelopathy. 2. Facet arthropathy and mild for age disc degeneration. Mild degenerative marrow edema noted about the left C4-5 facet. Electronically  Signed   By: Monte Fantasia M.D.   On: 06/17/2016 12:41   Ct Abdomen Pelvis W Contrast  Result Date: 07/01/2016 CLINICAL DATA:  Bladder cancer, non-Hodgkin's lymphoma and Barrett's esophagus. EXAM: CT CHEST, ABDOMEN, AND PELVIS WITH CONTRAST TECHNIQUE: Multidetector CT imaging of the chest, abdomen and pelvis was performed following the standard protocol during bolus administration of intravenous contrast. CONTRAST:  156mL ISOVUE-300 IOPAMIDOL (ISOVUE-300) INJECTION 61% COMPARISON:  CXR from 06/30/2016, chest CT from 09/09/2011, CT chest, abdomen and pelvis from 09/09/2011 FINDINGS: CT CHEST FINDINGS Cardiovascular: Undo top normal size cardiac chambers without pericardial effusion. No large central pulmonary embolus. Aortic atherosclerosis without aneurysm. No dissection. Mild ectasia of the ascending aorta as before up to 3.9 cm. Normal branch pattern for the great vessels. Mediastinum/Nodes: Stable subcentimeter left-sided thyroid nodules since 2013 consistent with benign findings. The largest currently is approximately 12 mm. Fat containing right lower paratracheal lymph node measuring up to 13 mm is without significant change. Stable hilar lymph nodes, the largest on the right approximately 9 mm short axis and on the left, 8 mm short axis. Lungs/Pleura: Stable calcified and noncalcified subcentimeter pulmonary nodules dating back to 2013 consistent with benign findings, index nodule the left upper lobe measuring 7 mm, series 5, image 76. Bibasilar dependent atelectasis. There is lingular, right middle lobe and both lower lobe bronchiectasis with mild peribronchial thickening and chronic minimal parenchymal scarring and/or atelectasis. Musculoskeletal: Lower thoracic kyphosis attributable to multilevel degenerative disc disease. Small osteophytes are noted along the anterior aspect of the mid and lower thoracic spine. No acute nor suspicious osseous abnormalities. CT ABDOMEN PELVIS FINDINGS Hepatobiliary:  Status post cholecystectomy. No space-occupying mass of the liver. No biliary dilatation. Pancreas: Unremarkable. No pancreatic ductal dilatation or surrounding inflammatory changes. Spleen: Normal in size without focal abnormality. Adrenals/Urinary Tract: Adrenal glands are unremarkable. Kidneys are normal, without renal calculi, focal lesion, or hydronephrosis. Bladder is unremarkable. Stomach/Bowel: Stomach is within normal limits. Appendix is surgically absent by report. No evidence of bowel wall thickening, distention, or inflammatory changes. Vascular/Lymphatic: Bandlike mesenteric soft tissue attenuation with  calcification in the mid abdomen is slightly more prominent than on prior consistent with treated lymphoma. Small scattered mesenteric lymph nodes are seen in the right lower quadrant and root of the mesentery as before. No inguinal or pelvic sidewall adenopathy. No retroperitoneal or mesenteric lymphadenopathy. No porta hepatic nor epigastric enlargement of lymph nodes Reproductive: Prostate is unremarkable. Other: No abdominal wall hernia or abnormality. No abdominopelvic ascites. Musculoskeletal: No acute or significant osseous findings. Degenerative disc disease L4-5. IMPRESSION: 1. Chronic since 2013 are stable pulmonary nodules noted bilaterally, index nodule the left upper lobe measures 7 mm consistent with benign findings. 2. Hazy mesenteric fat in the mid abdomen with calcification consistent with treated non-Hodgkin's lymphoma. No recurrence of lymphadenopathy identified within chest, abdomen or pelvis. 3. Cholecystectomy. Electronically Signed   By: Ashley Royalty M.D.   On: 07/01/2016 20:12   Ct Cerebral Perfusion W Contrast  Result Date: 06/30/2016 CLINICAL DATA:  Dysarthria.  Right-sided facial droop. EXAM: CT PERFUSION BRAIN TECHNIQUE: Multiphase CT imaging of the brain was performed following IV bolus contrast injection. Subsequent parametric perfusion maps were calculated using RAPID  software. CONTRAST:  40 mL Isovue 370 COMPARISON:  None. FINDINGS: CT Brain Perfusion Findings: CBF (<30%) Volume: 35mL Perfusion (Tmax>6.0s) volume: 79mL Mismatch Volume: 40mL Infarction Location:None No significant patient motion is present. Arterial input and venous output functions are excellent. IMPRESSION: Negative CT perfusion exam of the head. No evidence for acute infarct or ischemia. Electronically Signed   By: San Morelle M.D.   On: 06/30/2016 16:54   Dg Abd Portable 1v  Result Date: 07/02/2016 CLINICAL DATA:  Feeding tube placement. EXAM: PORTABLE ABDOMEN - 1 VIEW COMPARISON:  Abdomen and pelvis CT dated 07/01/2016. FINDINGS: Feeding tube tip in the mid stomach. Normal bowel gas pattern. Oral contrast in the right colon. Cholecystectomy clips. Thoracolumbar spine degenerative changes. IMPRESSION: Feeding tube tip in the mid stomach. Electronically Signed   By: Claudie Revering M.D.   On: 07/02/2016 15:57   Dg Swallowing Func-speech Pathology  Result Date: 07/01/2016 Objective Swallowing Evaluation: Type of Study: MBS-Modified Barium Swallow Study Patient Details Name: Jason [name] MRN: [id number] Date of Birth: [birth year] Today's Date: 07/01/2016 Time: SLP Start Time (ACUTE ONLY): 0910-SLP Stop Time (ACUTE ONLY): 0933 SLP Time Calculation (min) (ACUTE ONLY): 23 min Past Medical History: Past Medical History: Diagnosis Date . Arthritis  . Barrett esophagus  . Bladder cancer ([place]) dx'd 5188,4166  surg only . Colon polyps   adenomatous . Difficulty sleeping   OCCASIONALLY . Diverticulosis of colon (without mention of hemorrhage)  . Frequency of urination  . GERD (gastroesophageal reflux disease)  . Hiatal hernia  . History of bladder cancer 1981  EXCISION ONLY . History of skin cancer   BASAL CELL . Hyperlipidemia  . Lymphoma, non-Hodgkin's ([place]) 2011  [place]  . Macular degeneration   LEFT EYE . Multiple nodules of lung 09/16/2011 . PAF (paroxysmal atrial fibrillation) ([place])  . Personal  history of colonic polyps 07/15/2010  tubular adenoma . Prostate cancer [facility])   patient denies . PUD (peptic ulcer disease)  . Stomach cancer ([place])   NHL origin Past Surgical History: Past Surgical History: Procedure Laterality Date . APPENDECTOMY   . bone spur    left shoulder . CHOLECYSTECTOMY   . KNEE ARTHROPLASTY Left 07/30/2015  Procedure: LEFT TOTAL KNEE ARTHROPLASTY WITH COMPUTER NAVIGATION;  Surgeon: Rod Can, MD;  Location: [facility];  Service: Orthopedics;  Laterality: Left; . LASER OF PROSTATE W/ GREEN LIGHT PVP   .  TENDON REPAIR    right arm . TRIGGER FINGER RELEASE    bilateral hands . WRIST SURGERY    Bilateral HPI: Ptis an 83 y.o. malewith PMH significant of PAF, Barrett's esophagus, bladder cancer, non-Hodgkin's lymphoma, OA, andHLD; who presents with acute onset of slurred speech, right-sided facial droop, and right sided upper and lower extremity weakness. He had just been recently hospitalized from 3/14 -3/19 for community-acquired pneumonia. Due to swallowing difficulties patient was evaluated by speech therapy and seen to be silently aspirating. Patient was placed on modified diet (recommended Dys 3 solids, thin liquids). Upon discharge he was sent to a skilled nursing facilityfor rehabilitation. 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 No hemorrhage. Chronic small-vessel ischemic changes elsewhere. CXR showed some improvement in the appearance of the lungs especially upper lobes but coarse interstitial markings persist in the lower lobes. There is no discrete pneumonia. There is stablecardiomegaly without pulmonary vascular congestion.Thoracic aortic atherosclerosis. No Data Recorded Assessment / Plan / Recommendation CHL IP CLINICAL IMPRESSIONS 07/01/2016 Clinical Impression Pt has had a decline in swallow function this admission following acute pontine CVA. Impairments primarily motor based with decreased laryngeal elevation, laryngeal closure and  reduced tongue base retraction. Silent penetration and aspiration during initial and multiple swallows with nectar and honey thick consistencies. Chin tuck was ineffective to mitigate laryngeal invasion or to decrease vallecular residue during swallow. Pt required multiple verbal prompts for cough/throat clear which were weak and unable to clear vestibule. Although puree was not penetrated or aspirated, volume was significant and remained increasing risk. MBS does not diagnose below the level of the UES, however esophageal scan revealed stasis in distal esophagus. Recommend NPO status, oral care, short term alternative nutrition and continue ST intervention. From ST/swallow standpoint, pt is an excellent candidate for inpatient rehab.   SLP Visit Diagnosis Dysphagia, oropharyngeal phase (R13.12) Attention and concentration deficit following -- Frontal lobe and executive function deficit following -- Impact on safety and function --   CHL IP TREATMENT RECOMMENDATION 07/01/2016 Treatment Recommendations Therapy as outlined in treatment plan below   Prognosis 07/01/2016 Prognosis for Safe Diet Advancement Good Barriers to Reach Goals -- Barriers/Prognosis Comment -- CHL IP DIET RECOMMENDATION 07/01/2016 SLP Diet Recommendations NPO Liquid Administration via -- Medication Administration Via alternative means Compensations -- Postural Changes --   CHL IP OTHER RECOMMENDATIONS 07/01/2016 Recommended Consults -- Oral Care Recommendations Oral care QID Other Recommendations --   CHL IP FOLLOW UP RECOMMENDATIONS 07/01/2016 Follow up Recommendations Inpatient Rehab   CHL IP FREQUENCY AND DURATION 07/01/2016 Speech Therapy Frequency (ACUTE ONLY) min 2x/week Treatment Duration 2 weeks      CHL IP ORAL PHASE 07/01/2016 Oral Phase Impaired Oral - Pudding Teaspoon -- Oral - Pudding Cup -- Oral - Honey Teaspoon -- Oral - Honey Cup Lingual/palatal residue Oral - Nectar Teaspoon -- Oral - Nectar Cup Lingual/palatal residue Oral - Nectar  Straw -- Oral - Thin Teaspoon -- Oral - Thin Cup Lingual/palatal residue Oral - Thin Straw -- Oral - Puree Lingual/palatal residue Oral - Mech Soft NT Oral - Regular -- Oral - Multi-Consistency -- Oral - Pill -- Oral Phase - Comment --  CHL IP PHARYNGEAL PHASE 07/01/2016 Pharyngeal Phase Impaired Pharyngeal- Pudding Teaspoon -- Pharyngeal -- Pharyngeal- Pudding Cup -- Pharyngeal -- Pharyngeal- Honey Teaspoon -- Pharyngeal -- Pharyngeal- Honey Cup Pharyngeal residue - valleculae;Reduced tongue base retraction;Penetration/Aspiration during swallow;Reduced laryngeal elevation;Reduced airway/laryngeal closure Pharyngeal Material enters airway, passes BELOW cords without attempt by patient to  eject out (silent aspiration);Material enters airway, CONTACTS cords and not ejected out Pharyngeal- Nectar Teaspoon -- Pharyngeal -- Pharyngeal- Nectar Cup Delayed swallow initiation-vallecula;Pharyngeal residue - valleculae;Reduced tongue base retraction;Reduced laryngeal elevation;Penetration/Aspiration during swallow;Reduced airway/laryngeal closure;Pharyngeal residue - pyriform Pharyngeal Material enters airway, passes BELOW cords without attempt by patient to eject out (silent aspiration);Material enters airway, CONTACTS cords and not ejected out Pharyngeal- Nectar Straw -- Pharyngeal -- Pharyngeal- Thin Teaspoon -- Pharyngeal -- Pharyngeal- Thin Cup NT Pharyngeal -- Pharyngeal- Thin Straw -- Pharyngeal -- Pharyngeal- Puree Pharyngeal residue - valleculae;Reduced tongue base retraction Pharyngeal -- Pharyngeal- Mechanical Soft NT Pharyngeal -- Pharyngeal- Regular -- Pharyngeal -- Pharyngeal- Multi-consistency -- Pharyngeal -- Pharyngeal- Pill -- Pharyngeal -- Pharyngeal Comment --  CHL IP CERVICAL ESOPHAGEAL PHASE 07/01/2016 Cervical Esophageal Phase WFL Pudding Teaspoon -- Pudding Cup -- Honey Teaspoon -- Honey Cup -- Nectar Teaspoon -- Nectar Cup -- Nectar Straw -- Thin Teaspoon -- Thin Cup -- Thin Straw -- Puree -- Mechanical  Soft -- Regular -- Multi-consistency -- Pill -- Cervical Esophageal Comment -- CHL IP GO 06/16/2016 Functional Assessment Tool Used skilled clinical judgement Functional Limitations Swallowing Swallow Current Status (Z9935) CK Swallow Goal Status (T0177) CJ Swallow Discharge Status (L3903) (None) Motor Speech Current Status (E0923) (None) Motor Speech Goal Status (R0076) (None) Motor Speech Goal Status (A2633) (None) Spoken Language Comprehension Current Status (H5456) (None) Spoken Language Comprehension Goal Status (Y5638) (None) Spoken Language Comprehension Discharge Status (L3734) (None) Spoken Language Expression Current Status (K8768) (None) Spoken Language Expression Goal Status (T1572) (None) Spoken Language Expression Discharge Status [phone]) (None) Attention Current Status (D9741) (None) Attention Goal Status (U3845) (None) Attention Discharge Status (X6468) (None) Memory Current Status (E3212) (None) Memory Goal Status (Y4825) (None) Memory Discharge Status (O0370) (None) Voice Current Status (W8889) (None) Voice Goal Status (V6945) (None) Voice Discharge Status (W3888) (None) Other Speech-Language Pathology Functional Limitation Current Status (K8003) (None) Other Speech-Language Pathology Functional Limitation Goal Status (K9179) (None) Other Speech-Language Pathology Functional Limitation Discharge Status [phone]) (None) [name] 07/01/2016, 11:26 AM [name] [name].Ed CCC-SLP Pager [phone]              Dg Swallowing Func-speech Pathology  Result Date: 06/17/2016 Objective Swallowing Evaluation: Type of Study: MBS-Modified Barium Swallow Study Patient Details Name: Jason [name] MRN: [id number] Date of Birth: 10-Jan-1928 Today's Date: 06/17/2016 Time: SLP Start Time (ACUTE ONLY): 1230-SLP Stop Time (ACUTE ONLY): 1300 SLP Time Calculation (min) (ACUTE ONLY): 30 min Past Medical History: Past Medical History: Diagnosis Date . Arthritis  . Barrett esophagus  . Bladder cancer ([place]) dx'd  0786,7544  surg only . Colon polyps   adenomatous . Difficulty sleeping   OCCASIONALLY . Diverticulosis of colon (without mention of hemorrhage)  . Frequency of urination  . GERD (gastroesophageal reflux disease)  . Hiatal hernia  . History of bladder cancer 1981  EXCISION ONLY . History of skin cancer   BASAL CELL . Hyperlipidemia  . Lymphoma, non-Hodgkin's ([place]) 2011  [place]  . Macular degeneration   LEFT EYE . Multiple nodules of lung 09/16/2011 . PAF (paroxysmal atrial fibrillation) ([place])  . Personal history of colonic polyps 07/15/2010  tubular adenoma . Prostate cancer [facility])   patient denies . PUD (peptic ulcer disease)  . Stomach cancer ([place])   NHL origin Past Surgical History: Past Surgical History: Procedure Laterality Date . APPENDECTOMY   . bone spur    left shoulder . CHOLECYSTECTOMY   . KNEE ARTHROPLASTY Left 07/30/2015  Procedure: LEFT TOTAL KNEE ARTHROPLASTY WITH  COMPUTER NAVIGATION;  Surgeon: Rod Can, MD;  Location: [facility];  Service: Orthopedics;  Laterality: Left; . LASER OF PROSTATE W/ GREEN LIGHT PVP   . TENDON REPAIR    right arm . TRIGGER FINGER RELEASE    bilateral hands . WRIST SURGERY    Bilateral HPI: Donie Lemelin Wilsonis a 83 y.o.malewith medical history significant for Paroxysmal AFib, GERD, hiatal hernia, Barrett's esophagus, multiple lung nodules, bladder CA, non-Hodgkin's lymphoma, OA, hyperlipidemia who presented to the ED with c/o generalized weakness and fall. Chest x-ray showed possible CHF with superimposed pneumonia in the middle left lung base. Head CT showed no acute abnormalities, small vessel ischemic disease white matter changes,and ethmoidal paranasal sinus disease. Barium esophagram poor esophageal motility. No stricture or mass. Barium tablet passed readily into the stomach. Negative for reflux. No Data Recorded Assessment / Plan / Recommendation CHL IP CLINICAL IMPRESSIONS 06/17/2016 Clinical Impression  Pt demonstrates a moderate oropharyngeal dysphagia with  neuromuscular deficits impacting sensory and motor function. Pt observed to have bilateral ptosis, anterior spillage of secretions on the left (family reports history of left facial droop due to Bell's palsy) and weak cough strength upon arrival to exam. Despite deficits pt apepars conginitively intact and is able to follow complex commands.  Oral function chaacteraized by mild lingual weakness observed via mild lingual residuals, with functional abiltiy to masticate and transit soft solids (though endurance not taxed). Flash penetration and silent aspriation during the swallow occured with boluses larger than 44ml (estimate) due to slow laryngeal closure and epiglottic deflection. Pt was able trigger a timely swallow and eventually achieve full laryngeal closure which expelled penetrate when possible, but again sluggish movement kept vestible partialy open during bolus passage. Further silent aspiration events also occured after the swallow if pt was cued to cough, which spilled valleuular residual into the airway. Attempted chin tuck without improvement and mendelsohn with visual biofeedback (pt unable after 5 attempts with max cues). Best methods included verbal cues for small sips (bolus size consistently comparable in appearance and fucntion with 20mL teaspoon), oral hold and effortful swallow x2. Given increased residuals with thicker textures, recommend pt initiate a dys 2/thin liquid diet. Recommend water only with oral care at least 3x day with toothbrush and paste. Pt will benefit from exercises to target hyolaryngeal excursion and base of tongue strength as well as EMST. Will f/u for education with family.  SLP Visit Diagnosis Dysphagia, oropharyngeal phase (R13.12) Attention and concentration deficit following -- Frontal lobe and executive function deficit following -- Impact on safety and function --   CHL IP TREATMENT RECOMMENDATION 06/17/2016 Treatment Recommendations Therapy as outlined in treatment  plan below   Prognosis 06/17/2016 Prognosis for Safe Diet Advancement Good Barriers to Reach Goals -- Barriers/Prognosis Comment -- CHL IP DIET RECOMMENDATION 06/17/2016 SLP Diet Recommendations Dysphagia 2 (Fine chop) solids;Thin liquid Liquid Administration via Cup Medication Administration Whole meds with puree Compensations Slow rate;Small sips/bites;Effortful swallow;Minimize environmental distractions;Clear throat intermittently Postural Changes Remain semi-upright after after feeds/meals (Comment)   CHL IP OTHER RECOMMENDATIONS 06/17/2016 Recommended Consults -- Oral Care Recommendations Oral care before and after PO Other Recommendations --   CHL IP FOLLOW UP RECOMMENDATIONS 06/17/2016 Follow up Recommendations 24 hour supervision/assistance   CHL IP FREQUENCY AND DURATION 06/17/2016 Speech Therapy Frequency (ACUTE ONLY) min 3x week Treatment Duration 2 weeks      CHL IP ORAL PHASE 06/17/2016 Oral Phase Impaired Oral - Pudding Teaspoon -- Oral - Pudding Cup -- Oral - Honey Teaspoon -- Oral -  Honey Cup -- Oral - Nectar Teaspoon -- Oral - Nectar Cup -- Oral - Nectar Straw -- Oral - Thin Teaspoon -- Oral - Thin Cup Left anterior bolus loss;Lingual/palatal residue Oral - Thin Straw -- Oral - Puree WFL Oral - Mech Soft WFL Oral - Regular -- Oral - Multi-Consistency -- Oral - Pill -- Oral Phase - Comment --  CHL IP PHARYNGEAL PHASE 06/17/2016 Pharyngeal Phase Impaired Pharyngeal- Pudding Teaspoon -- Pharyngeal -- Pharyngeal- Pudding Cup -- Pharyngeal -- Pharyngeal- Honey Teaspoon -- Pharyngeal -- Pharyngeal- Honey Cup -- Pharyngeal -- Pharyngeal- Nectar Teaspoon -- Pharyngeal -- Pharyngeal- Nectar Cup -- Pharyngeal -- Pharyngeal- Nectar Straw -- Pharyngeal -- Pharyngeal- Thin Teaspoon -- Pharyngeal -- Pharyngeal- Thin Cup Reduced anterior laryngeal mobility;Penetration/Aspiration during swallow;Reduced tongue base retraction;Penetration/Apiration after swallow;Moderate aspiration;Trace aspiration;Pharyngeal residue -  valleculae;Compensatory strategies attempted (with notebox) Pharyngeal Material enters airway, passes BELOW cords without attempt by patient to eject out (silent aspiration);Material enters airway, CONTACTS cords and then ejected out;Material does not enter airway Pharyngeal- Thin Straw -- Pharyngeal -- Pharyngeal- Puree Reduced anterior laryngeal mobility;Reduced tongue base retraction;Pharyngeal residue - valleculae;Compensatory strategies attempted (with notebox) Pharyngeal -- Pharyngeal- Mechanical Soft Reduced anterior laryngeal mobility;Reduced tongue base retraction;Pharyngeal residue - valleculae;Compensatory strategies attempted (with notebox) Pharyngeal -- Pharyngeal- Regular -- Pharyngeal -- Pharyngeal- Multi-consistency -- Pharyngeal -- Pharyngeal- Pill -- Pharyngeal -- Pharyngeal Comment --  No flowsheet data found. CHL IP GO 06/16/2016 Functional Assessment Tool Used skilled clinical judgement Functional Limitations Swallowing Swallow Current Status [phone]) CK Swallow Goal Status (Y2482) [place] Swallow Discharge Status [phone]) (None) Motor Speech Current Status [phone]) (None) Motor Speech Goal Status (B1694) (None) Motor Speech Goal Status (H0388) (None) Spoken Language Comprehension Current Status (E2800) (None) Spoken Language Comprehension Goal Status (L4917) (None) Spoken Language Comprehension Discharge Status [phone]) (None) Spoken Language Expression Current Status [phone]) (None) Spoken Language Expression Goal Status [phone]) (None) Spoken Language Expression Discharge Status [phone]) (None) Attention Current Status (O7078) (None) Attention Goal Status (M7544) (None) Attention Discharge Status [phone]) (None) Memory Current Status (O7121) (None) Memory Goal Status (F7588) (None) Memory Discharge Status (T2549) (None) Voice Current Status (I2641) (None) Voice Goal Status (R8309) (None) Voice Discharge Status (M0768) (None) Other Speech-Language Pathology Functional Limitation Current Status (G8811)  (None) Other Speech-Language Pathology Functional Limitation Goal Status (S3159) (None) Other Speech-Language Pathology Functional Limitation Discharge Status [phone]) (None) De[place], Katherene Ponto 06/17/2016, 2:21 PM              Ct Head Code Stroke W/o Cm  Addendum Date: 06/30/2016   ADDENDUM REPORT: 06/30/2016 16:06 ADDENDUM: Study discussed by telephone on 06/30/2016 with Dr. Harrell Gave Tegeler, at 1534 hours. Electronically Signed   By: Genevie Ann M.D.   On: 06/30/2016 16:06   Result Date: 06/30/2016 CLINICAL DATA:  Code stroke. 83 year old male with episode of right leg weakness slurred speech and right facial droop which lasted about 1 hour today. Facial droop persists but other symptoms have resolved. Initial encounter. EXAM: CT HEAD WITHOUT CONTRAST TECHNIQUE: Contiguous axial images were obtained from the base of the skull through the vertex without intravenous contrast. COMPARISON:  Brain MRI 06/16/2016 and earlier. FINDINGS: Brain: Stable cerebral volume. No midline shift, mass effect, or evidence of intracranial mass lesion. No ventriculomegaly. No acute intracranial hemorrhage identified. Stable gray-white matter differentiation throughout the brain. Chronic white matter hypodensity appears stable. No cortically based acute infarct identified. Vascular: Calcified atherosclerosis at the skull base. No suspicious intracranial vascular hyperdensity. Skull: No acute osseous abnormality identified. Sinuses/Orbits: Visualized paranasal sinuses and mastoids are  stable and well pneumatized. Other: No acute orbit or scalp soft tissue findings. ASPECTS ([place] Stroke Program Early CT Score) - Ganglionic level infarction (caudate, lentiform nuclei, internal capsule, insula, M1-M3 cortex): 7 - Supraganglionic infarction (M4-M6 cortex): 3 Total score (0-10 with 10 being normal): 10 IMPRESSION: 1. Stable non contrast CT appearance of the brain, no acute intracranial abnormality. 2. ASPECTS is 10. 3. Chronic  white matter changes. Electronically Signed: By: Genevie Ann M.D. On: 06/30/2016 15:24     CBC  Recent Labs Lab 06/30/16 1524 06/30/16 1530 07/02/16 0403 07/05/16 0408  WBC 8.2  --  6.2 6.6  HGB 12.1* 12.9* 11.9* 11.3*  HCT 38.3* 38.0* 37.4* 34.9*  PLT 194  --  160 138*  MCV 85.5  --  84.6 84.3  MCH 27.0  --  26.9 27.3  MCHC 31.6  --  31.8 32.4  RDW 13.5  --  13.4 13.8  LYMPHSABS 1.7  --   --   --   MONOABS 0.5  --   --   --   EOSABS 0.1  --   --   --   BASOSABS 0.0  --   --   --     Chemistries   Recent Labs Lab 06/30/16 1524 06/30/16 1530 07/03/16 0346 07/05/16 0408  NA 140 142 138 137  K 3.9 3.8 3.3* 3.2*  CL 106 105 109 108  CO2 25  --  21* 23  GLUCOSE 103* 98 129* 98  BUN 28* 30* 26* 18  CREATININE 1.21 1.10 1.06 0.88  CALCIUM 9.1  --  8.5* 8.3*  AST 23  --   --  34  ALT 12*  --   --  19  ALKPHOS 30*  --   --  25*  BILITOT 0.8  --   --  0.6   ------------------------------------------------------------------------------------------------------------------ estimated creatinine clearance is 73.6 mL/min (by C-G formula based on SCr of 0.88 mg/dL). ------------------------------------------------------------------------------------------------------------------ No results for input(s): HGBA1C in the last 72 hours. ------------------------------------------------------------------------------------------------------------------ No results for input(s): CHOL, HDL, LDLCALC, TRIG, CHOLHDL, LDLDIRECT in the last 72 hours. ------------------------------------------------------------------------------------------------------------------ No results for input(s): TSH, T4TOTAL, T3FREE, THYROIDAB in the last 72 hours.  Invalid input(s): FREET3 ------------------------------------------------------------------------------------------------------------------ No results for input(s): VITAMINB12, FOLATE, FERRITIN, TIBC, IRON, RETICCTPCT in the last 72 hours.  Coagulation  profile  Recent Labs Lab 06/30/16 1524  INR 1.58    No results for input(s): DDIMER in the last 72 hours.  Cardiac Enzymes No results for input(s): CKMB, TROPONINI, MYOGLOBIN in the last 168 hours.  Invalid input(s): CK ------------------------------------------------------------------------------------------------------------------ Invalid input(s): POCBNP   CBG:  Recent Labs Lab 07/05/16 0006 07/05/16 0145 07/05/16 0439 07/05/16 0747 07/05/16 1153  GLUCAP 88 82 102* 92 103*       Studies: No results found.    Lab Results  Component Value Date   HGBA1C 5.5 07/01/2016   Lab Results  Component Value Date   LDLCALC 68 07/01/2016   CREATININE 0.88 07/05/2016       Scheduled Meds: . enoxaparin (LOVENOX) injection  1 mg/kg Subcutaneous Q12H  . free water  200 mL Per Tube Q6H  . guaiFENesin  600 mg Oral BID  . pantoprazole  40 mg Oral BID  . simvastatin  40 mg Oral QHS  . tamsulosin  0.4 mg Oral QHS   Continuous Infusions: . dextrose 5 % and 0.45% NaCl 75 mL/hr at 07/05/16 0208  . feeding supplement (JEVITY 1.2 CAL) 1,000 mL (07/04/16 0503)     LOS:  4 days    Time spent: >30 MINS    [facility] Pager [phone]. If 7PM-7AM, please contact night-coverage at [email or web address], password [facility] 07/05/2016, 1:04 PM  LOS: 4 days

## 2016-07-05 NOTE — Progress Notes (Signed)
  Speech Language Pathology Treatment: Dysphagia  Patient Details Name: Jason [name] MRN: [id number] DOB: Feb 20, 1928 Today's Date: 07/05/2016 Time: 1040-1109 SLP Time Calculation (min) (ACUTE ONLY): 29 min  Assessment / Plan / Recommendation Clinical Impression  Cortrak came out yesterday and advised not to replace due to new plan of repeating MBS today at 1300. Mild-moderate visual, verbal cues needed during strengthening exercises to facilitate tongue base and laryngeal elevation. Able to perform with moderate accuracy. Trials of ice chips and 1/2 teaspoon water given with delayed cough x 1. MBS today at 1300 for possible initiation of food/liquid.     HPI HPI: Ptis an 83 y.o. malewith PMH significant of PAF, Barrett's esophagus, bladder cancer, non-Hodgkin's lymphoma, OA, andHLD; who presents with acute onset of slurred speech, right-sided facial droop, and right sided upper and lower extremity weakness. He had just been recently hospitalized from 3/14 -3/19 for community-acquired pneumonia. Due to swallowing difficulties patient was evaluated by speech therapy and seen to be silently aspirating. Patient was placed on modified diet (recommended Dys 3 solids, thin liquids). Upon discharge he was sent to a skilled nursing facilityfor rehabilitation. 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 No hemorrhage. Chronic small-vessel ischemic changes elsewhere. CXR showed some improvement in the appearance of the lungs especially upper lobes but coarse interstitial markings persist in the lower lobes. There is no discrete pneumonia. There is stablecardiomegaly without pulmonary vascular congestion.Thoracic aortic atherosclerosis.      SLP Plan  New goals to be determined pending instrumental study       Recommendations  Diet recommendations: NPO                Oral Care Recommendations: Oral care QID Follow up Recommendations: Skilled Nursing  facility SLP Visit Diagnosis: Dysarthria and anarthria (R47.1) Plan: New goals to be determined pending instrumental study       GO                [name] 07/05/2016, 11:40 AM  [name] [name].[employer] [phone]

## 2016-07-06 LAB — BASIC METABOLIC PANEL
ANION GAP: 7 (ref 5–15)
BUN: 13 mg/dL (ref 6–20)
CHLORIDE: 106 mmol/L (ref 101–111)
CO2: 24 mmol/L (ref 22–32)
Calcium: 8.6 mg/dL — ABNORMAL LOW (ref 8.9–10.3)
Creatinine, Ser: 0.83 mg/dL (ref 0.61–1.24)
GFR calc Af Amer: >60 mL/min (ref >60)
Glucose, Bld: 93 mg/dL (ref 65–99)
POTASSIUM: 3.3 mmol/L — AB (ref 3.5–5.1)
SODIUM: 137 mmol/L (ref 135–145)

## 2016-07-06 LAB — GLUCOSE, CAPILLARY
GLUCOSE-CAPILLARY: 100 mg/dL — AB (ref 65–99)
GLUCOSE-CAPILLARY: 89 mg/dL (ref 65–99)
GLUCOSE-CAPILLARY: 177 mg/dL — AB (ref 65–99)
Glucose-Capillary: 102 mg/dL — ABNORMAL HIGH (ref 65–99)
Glucose-Capillary: 126 mg/dL — ABNORMAL HIGH (ref 65–99)
Glucose-Capillary: 91 mg/dL (ref 65–99)

## 2016-07-06 MED ORDER — APIXABAN 5 MG PO TABS
5.0000 mg | ORAL_TABLET | Freq: Two times a day (BID) | ORAL | Status: DC
  Administered 2016-07-06 – 2016-07-07 (×2): 5 mg via ORAL
  Filled 2016-07-06 (×2): qty 1

## 2016-07-06 MED ORDER — POTASSIUM CHLORIDE CRYS ER 20 MEQ PO TBCR
40.0000 meq | EXTENDED_RELEASE_TABLET | Freq: Two times a day (BID) | ORAL | Status: AC
  Administered 2016-07-06 (×2): 40 meq via ORAL
  Filled 2016-07-06 (×2): qty 2

## 2016-07-06 NOTE — Progress Notes (Signed)
  Speech Language Pathology Treatment: Dysphagia  Patient Details Name: Jason [name] MRN: [id number] DOB: 1928/02/04 Today's Date: 07/06/2016 Time: 8101-7510 SLP Time Calculation (min) (ACUTE ONLY): 21 min  Assessment / Plan / Recommendation Clinical Impression  SLP followed up for continued pt and caregiver education following most recent MBS. Pt noted to have mild impulsivity in rate of consumption, requiring moderate verbal and tactile cues for adherence to swallow strategies. Intermittent anterior spillage noted at midline with honey thick by teaspoon. Cued for multiple swallows and intermittent throat clear. Pts daughter present for reinforcement of swallow strategies. Pt with productive cough following puree consistency with green sputum expectoration which daughter states is baseline for pt all the time. Continue puree and honey thick liquids by teaspoon with continued ST intervention.    HPI HPI: Ptis an 83 y.o. malewith PMH significant of PAF, Barrett's esophagus, bladder cancer, non-Hodgkin's lymphoma, OA, andHLD; who presents with acute onset of slurred speech, right-sided facial droop, and right sided upper and lower extremity weakness. He had just been recently hospitalized from 3/14 -3/19 for community-acquired pneumonia. Due to swallowing difficulties patient was evaluated by speech therapy and seen to be silently aspirating. Patient was placed on modified diet (recommended Dys 3 solids, thin liquids). Upon discharge he was sent to a skilled nursing facilityfor rehabilitation. 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 No hemorrhage. Chronic small-vessel ischemic changes elsewhere. CXR showed some improvement in the appearance of the lungs especially upper lobes but coarse interstitial markings persist in the lower lobes. There is no discrete pneumonia. There is stablecardiomegaly without pulmonary vascular congestion.Thoracic aortic  atherosclerosis.      SLP Plan          Recommendations  Diet recommendations: Honey-thick liquid;Dysphagia 1 (puree) Liquids provided via: Teaspoon Medication Administration: Crushed with puree Supervision: Patient able to self feed;Full supervision/cueing for compensatory strategies Compensations: Slow rate;Small sips/bites;Clear throat intermittently;Monitor for anterior loss;Minimize environmental distractions Postural Changes and/or Swallow Maneuvers: Seated upright 90 degrees;Upright 30-60 min after meal                Oral Care Recommendations: Oral care BID Follow up Recommendations: Skilled Nursing facility SLP Visit Diagnosis: Dysphagia, oropharyngeal phase (R13.12)       GO                   Arvil Chaco MA, CCC-SLP Acute Care Speech Language Pathologist    Levi Aland 07/06/2016, 11:42 AM

## 2016-07-06 NOTE — Progress Notes (Signed)
CSW spoke to patients daughter about pending discharge. Daughter stated she would like patient to go back to [facility]. CSW spoke to admissions coordinator for [facility] and made them aware of patients discharge tomorrow. Facility stated they are able to take him back.  Rhea Pink, MSW,  [place]

## 2016-07-06 NOTE — Progress Notes (Signed)
[facility] PROGRESS NOTE  Jason [name] GLO:[id number] DOB: 1927-09-19 DOA: 06/30/2016   PCP: Melinda Crutch, MD     Assessment/Plan: Principal Problem:   Hemiplegia affecting dominant side ([place]) Active Problems:   Hyperlipidemia   GERD (gastroesophageal reflux disease)   BPH (benign prostatic hyperplasia)   PAF (paroxysmal atrial fibrillation) (HCC)   Dysphagia   CVA (cerebral vascular accident) ([place])   Dysarthria   History of cancer   83 y.o. right handed male with medical history significant of PAF on Eliquis, Barrett's esophagus, bladder cancer, non-Hodgkin's lymphoma, OA, and HLD;  who presents with acute onset of slurred speech, right-sided facial droop, and right sided upper and lower extremity weakness. He had just been recently hospitalized from 3/14 -3/19 for community-acquired pneumonia. During hospitalization he had an event of unresponsiveness with bradycardia, but cardiology felt it was noncardiac in nature. Patient had negative MRI, MRA, and EEG studies. Due to swallowing difficulties patient was evaluated by speech therapy and seen to be silently aspirating.Upon discharge he was sent to a skilled nursing facility for rehabilitation and completed a full course of Levaquin on 3/21.  3/29  he was noted to have a sudden right facial droop, worsening of dysarthria and right leg plegia. Neurology was called and patient was brought for a emergent CT perfusion to evaluate for M1 stenosis or occlusion. MRI showed acute stroke. Patient has significant dysphagia, was NPO. Patient may need PEG tube during this hospitalization, if he continues to have swallowing issues    Assessment/Plan  Stroke:   L paramedian pontine infarct secondary to small vessel disease source. However, pt has pAfib on eliquis    CTA head/neck no ELVO. Atherosclerosis. Multilevel spondylosis  CT Perfusion negative. No acute infarct  MRI  acute L paramedian pontine infarct. small vessel disease.    LE venous doppler 06/15/16 NO DVT. R&L enlarged lymph nodes  2D Echo  06/15/16 severe LVH. EF 55-60%. No valve abnormalities  Pan CT negative for malignancy,No recurrence of lymphadenopathy identified within chest, abdomen or pelvis  LDL 68 ,Statin via  cor trak tube    HgbA1c 5.5  UDS negative  lovenox therapeutic dose ,  Patient had Cortrak  tube which  fell out 4/2,  Not Replaced 4/3 , successful MBS now started on a diet -dysphagia 1, honey thick, may need to consider PEG tube if patient has recurrent aspiration issues ,IR on standby  Eliquis (apixaban) daily prior to admission, now on lovenox therapeutic dose due to NPO status.  Restart eliquis.  therapy recommendations: SNF [name]-anticipate discharge 4/5  Pt has follow up appointment with Dr. [name] at [facility] on 07/07/16   Physical deconditioning, drooling and swallowing difficulty - ? Neuromuscular disorder vs. ? Hx of stroke Admitted 2 weeks ago and concerning for neuromuscular disease MG panel negative  Recommended outpt EMG/NCS Has appointment with Dr. [name] at [facility] on 07/07/16 Pt had left drooling intermittently for the last 2 years Pt has swallow difficulty for 6 months CT and MRI showed remote right IC genu infarct and right thalamic punctate infarcts Pan CT  No evidence of advanced malignancy or recurrence of lymphoma    Previous diet recommendations from hospitalization on 3/14:  Previously on Dysphagia 3 (mechanical soft);Thin liquid Patient now on a dysphagia 1 diet and honey thick liquids since 4/3    Recent community-acquired pneumonia: Chest x-ray overall improved. - Continue Mucinex, completed Levaquin  Paroxysmal atrial fibrillation: Chadsvasc score = 5 - Continue Eliquis  when by mouth intake established  Hyperlipidemia - Continue simvastatin  BPH - Continue Flomax    Abnormal UA Urine culture insignificant  Normocytic normochromic anemia: Stable - Continue to monitor   GERD with  H/O Barrett's esophagus Continue PPI  obesity, Body mass index is 31.75 kg/m., recommend weight loss, diet and exercise as appropriate   Hypokalemia-replete  DVT prophylaxsis Lovenox >eliquis  Code Status:   Full code   Family Communication: Discussed in detail with the patient/ daughter , all imaging results, lab results explained to the patient   Disposition Plan:   Discharge barriers primarily his dysphagia which is improving . Anticipate discharge tomorrow if electrolytes are stable 4/5     Consultants: Neurology  Procedures: None   Antibiotics: Anti-infectives    None         HPI/Subjective: Speech is improving , daughter states that he talked to some family members on the phone this morning    Objective: Vitals:   07/05/16 2110 07/06/16 0115 07/06/16 0523 07/06/16 1006  BP: (!) 164/66 (!) 145/65 (!) 137/51 (!) 106/54  Pulse: 64 60 61 62  Resp: 18 18 16 18   Temp: 98.2 F (36.8 C) 98.6 F (37 C) 98.6 F (37 C) 98.8 F (37.1 C)  TempSrc: Oral Oral Oral Oral  SpO2: 100% 97% 98% 99%  Weight:   105.8 kg (233 lb 3.2 oz)   Height:        Intake/Output Summary (Last 24 hours) at 07/06/16 1025 Last data filed at 07/06/16 4097  Gross per 24 hour  Intake              960 ml  Output             1075 ml  Net             -115 ml    Exam:  Examination:  General exam: Appears calm and comfortable  Respiratory system: Clear to auscultation. Respiratory effort normal. Cardiovascular system: S1 & S2 heard, RRR. No JVD, murmurs, rubs, gallops or clicks. No pedal edema. Gastrointestinal system: Abdomen is nondistended, soft and nontender. No organomegaly or masses felt. Normal bowel sounds heard. Central nervous system:   dysarthria,right nasolabial fold flattening Extremities: Symmetric 5 x 5 power. Skin: No rashes, lesions or ulcers Psychiatry: Judgement and insight appear normal. Mood & affect appropriate.     Data Reviewed: I have personally reviewed  following labs and imaging studies  Micro Results Recent Results (from the past 240 hour(s))  Culture, Urine     Status: Abnormal   Collection Time: 06/30/16  5:30 PM  Result Value Ref Range Status   Specimen Description URINE, RANDOM  Final   Special Requests NONE  Final   Culture <10,000 COLONIES/mL INSIGNIFICANT GROWTH (A)  Final   Report Status 07/02/2016 FINAL  Final    Radiology Reports Ct Angio Head W Or Wo Contrast  Result Date: 06/30/2016 CLINICAL DATA:  New onset right-sided facial droop and intermittent dysarthria over the last 2 days. EXAM: CT ANGIOGRAPHY HEAD AND NECK TECHNIQUE: Multidetector CT imaging of the head and neck was performed using the standard protocol during bolus administration of intravenous contrast. Multiplanar CT image reconstructions and MIPs were obtained to evaluate the vascular anatomy. Carotid stenosis measurements (when applicable) are obtained utilizing NASCET criteria, using the distal internal carotid diameter as the denominator. CONTRAST:  50 mL Isovue 370 COMPARISON:  CT head without contrast from the same day. MRI brain 06/16/2016 FINDINGS: CTA NECK FINDINGS Aortic  arch: A 3 vessel arch configuration is present. Atherosclerotic changes are present at the aortic arch without significant stenosis at the great vessel origins. There is no aneurysm. Right carotid system: Common carotid artery is tortuous proximally. There is no focal stenosis. Atherosclerotic calcifications are present at the right carotid bifurcation without significant stenosis relative to the more distal vessel. Remainder of the right internal carotid artery is normal. Left carotid system: The left common carotid artery is tortuous proximally. There is no significant stenosis. Atherosclerotic changes are present at the carotid bifurcation without significant stenosis. The more distal left internal carotid artery is normal. Vertebral arteries: Both vertebral arteries originate from the  subclavian arteries without significant stenoses. The vertebral arteries are codominant. There is no significant stenosis or vascular injury to either vertebral artery in the neck. Skeleton: Mild endplate degenerative and facet changes are present. No focal lytic or blastic lesions are present. There is exaggerated thoracic kyphosis and cervical lordosis. Other neck: No focal mucosal or submucosal lesions are present. Bilateral thyroid nodules are present without a dominant lesion. There is no significant adenopathy. The salivary glands are within normal limits. Upper chest: The lung apices demonstrate bilateral dependent atelectasis. No focal nodule or mass lesion is present. Review of the MIP images confirms the above findings CTA HEAD FINDINGS Anterior circulation: Atherosclerotic calcifications are present within the cavernous internal carotid arteries bilaterally without a significant stenosis through the ICA terminus. The A1 and M1 segments are normal. The anterior communicating artery is patent. The MCA bifurcations are intact. There is segmental attenuation of ACA and MCA branch vessels bilaterally without a significant proximal stenosis or occlusion. Posterior circulation: The vertebral arteries are codominant. Atherosclerotic changes are present at the dural margin. The PICA origins are visualized and normal. The basilar artery is normal. Both posterior cerebral arteries originate from the basilar tip. There is no significant proximal stenosis or occlusion within either posterior cerebral artery. Venous sinuses: The dural sinuses are patent. The right transverse sinus is dominant. The straight sinus deep cerebral veins are intact. The cortical veins are unremarkable. Anatomic variants: None Delayed phase: Not performed. Arterial source images are unremarkable. Review of the MIP images confirms the above findings IMPRESSION: 1. No emerging large vessel occlusion. 2. Atherosclerotic changes at the aortic  arch, carotid bifurcations, dural margin of vertebral arteries, and cavernous internal carotid arteries without significant stenoses. 3. Mild diffuse segmental branch vessel irregularity within both the anterior and posterior circulation suggesting intracranial atherosclerotic change. There is no significant proximal stenosis or occlusion. 4. Multilevel spondylosis of the cervical spine. Electronically Signed   By: San Morelle M.D.   On: 06/30/2016 17:03   Dg Chest 2 View  Result Date: 06/30/2016 CLINICAL DATA:  Cough, recent episode of pneumonia, stroke symptoms. History of bladder malignancy EXAM: CHEST  2 VIEW COMPARISON:  Chest x-ray of June 15, 2016 FINDINGS: The lungs are adequately inflated. The interstitial markings are coarse in the mid and lower lung zones but have improved in the upper lobes. The cardiac silhouette remains enlarged. The pulmonary vascularity is not engorged. There is no pleural effusion. There is calcification in the wall of the thoracic aorta. There is multilevel degenerative disc disease of the thoracic spine. IMPRESSION: There has been some improvement in the appearance of the lungs especially upper lobes but coarse interstitial markings persist in the lower lobes. There is no discrete pneumonia. There is stable cardiomegaly without pulmonary vascular congestion. Thoracic aortic atherosclerosis. Electronically Signed   By: David  Martinique  M.D.   On: 06/30/2016 15:47   Dg Chest 2 View  Result Date: 06/15/2016 CLINICAL DATA:  Dizziness and confusion EXAM: CHEST  2 VIEW COMPARISON:  Aug 09, 2013 FINDINGS: There is cardiomegaly with mild pulmonary venous hypertension. There is mild interstitial edema in the lung bases. There is focal airspace consolidation in the medial left base. Lungs elsewhere clear. No adenopathy. No bone lesions. IMPRESSION: Findings consistent with a degree of congestive heart failure. Suspect superimposed pneumonia medial left base. Followup PA and  lateral chest radiographs recommended in 3-4 weeks following trial of antibiotic therapy to ensure resolution and exclude underlying malignancy. Electronically Signed   By: Lowella Grip III M.D.   On: 06/15/2016 07:58   Ct Head Wo Contrast  Result Date: 06/15/2016 CLINICAL DATA:  Lower extremity weakness.  Fall. EXAM: CT HEAD WITHOUT CONTRAST TECHNIQUE: Contiguous axial images were obtained from the base of the skull through the vertex without intravenous contrast. COMPARISON:  Head CT November 15, 2015 FINDINGS: Brain: Moderate diffuse atrophy is stable. There is no intracranial mass, hemorrhage, extra-axial fluid collection, or midline shift. There is mild patchy small vessel disease in the centra semiovale bilaterally. Elsewhere gray-white compartments appear normal. No acute infarct evident. Vascular: No hyperdense vessel. There are foci of calcification in each carotid siphon region as well as in each distal vertebral artery. Skull: The bony calvarium appears intact. Sinuses/Orbits: There is mucosal thickening and opacification in multiple ethmoid air cells. There is mucosal thickening in the anteromedial right sphenoid sinus. Visualized paranasal sinuses elsewhere are clear. Visualized orbits appear symmetric bilaterally with the exception of apparent cataract removal on the right. Other: Mastoid air cells are clear. IMPRESSION: Atrophy with relatively mild periventricular small vessel disease, stable. No intracranial mass, hemorrhage, or extra-axial fluid collection. No acute appearing infarct. Areas of arterial vascular calcification noted. There is paranasal sinus disease, primarily in multiple ethmoid air cells. Electronically Signed   By: Lowella Grip III M.D.   On: 06/15/2016 08:10   Ct Angio Neck W Or Wo Contrast  Result Date: 06/30/2016 CLINICAL DATA:  New onset right-sided facial droop and intermittent dysarthria over the last 2 days. EXAM: CT ANGIOGRAPHY HEAD AND NECK TECHNIQUE:  Multidetector CT imaging of the head and neck was performed using the standard protocol during bolus administration of intravenous contrast. Multiplanar CT image reconstructions and MIPs were obtained to evaluate the vascular anatomy. Carotid stenosis measurements (when applicable) are obtained utilizing NASCET criteria, using the distal internal carotid diameter as the denominator. CONTRAST:  50 mL Isovue 370 COMPARISON:  CT head without contrast from the same day. MRI brain 06/16/2016 FINDINGS: CTA NECK FINDINGS Aortic arch: A 3 vessel arch configuration is present. Atherosclerotic changes are present at the aortic arch without significant stenosis at the great vessel origins. There is no aneurysm. Right carotid system: Common carotid artery is tortuous proximally. There is no focal stenosis. Atherosclerotic calcifications are present at the right carotid bifurcation without significant stenosis relative to the more distal vessel. Remainder of the right internal carotid artery is normal. Left carotid system: The left common carotid artery is tortuous proximally. There is no significant stenosis. Atherosclerotic changes are present at the carotid bifurcation without significant stenosis. The more distal left internal carotid artery is normal. Vertebral arteries: Both vertebral arteries originate from the subclavian arteries without significant stenoses. The vertebral arteries are codominant. There is no significant stenosis or vascular injury to either vertebral artery in the neck. Skeleton: Mild endplate degenerative and facet changes are  present. No focal lytic or blastic lesions are present. There is exaggerated thoracic kyphosis and cervical lordosis. Other neck: No focal mucosal or submucosal lesions are present. Bilateral thyroid nodules are present without a dominant lesion. There is no significant adenopathy. The salivary glands are within normal limits. Upper chest: The lung apices demonstrate bilateral  dependent atelectasis. No focal nodule or mass lesion is present. Review of the MIP images confirms the above findings CTA HEAD FINDINGS Anterior circulation: Atherosclerotic calcifications are present within the cavernous internal carotid arteries bilaterally without a significant stenosis through the ICA terminus. The A1 and M1 segments are normal. The anterior communicating artery is patent. The MCA bifurcations are intact. There is segmental attenuation of ACA and MCA branch vessels bilaterally without a significant proximal stenosis or occlusion. Posterior circulation: The vertebral arteries are codominant. Atherosclerotic changes are present at the dural margin. The PICA origins are visualized and normal. The basilar artery is normal. Both posterior cerebral arteries originate from the basilar tip. There is no significant proximal stenosis or occlusion within either posterior cerebral artery. Venous sinuses: The dural sinuses are patent. The right transverse sinus is dominant. The straight sinus deep cerebral veins are intact. The cortical veins are unremarkable. Anatomic variants: None Delayed phase: Not performed. Arterial source images are unremarkable. Review of the MIP images confirms the above findings IMPRESSION: 1. No emerging large vessel occlusion. 2. Atherosclerotic changes at the aortic arch, carotid bifurcations, dural margin of vertebral arteries, and cavernous internal carotid arteries without significant stenoses. 3. Mild diffuse segmental branch vessel irregularity within both the anterior and posterior circulation suggesting intracranial atherosclerotic change. There is no significant proximal stenosis or occlusion. 4. Multilevel spondylosis of the cervical spine. Electronically Signed   By: San Morelle M.D.   On: 06/30/2016 17:03   Ct Chest W Contrast  Result Date: 07/01/2016 CLINICAL DATA:  Bladder cancer, non-Hodgkin's lymphoma and Barrett's esophagus. EXAM: CT CHEST, ABDOMEN,  AND PELVIS WITH CONTRAST TECHNIQUE: Multidetector CT imaging of the chest, abdomen and pelvis was performed following the standard protocol during bolus administration of intravenous contrast. CONTRAST:  172mL ISOVUE-300 IOPAMIDOL (ISOVUE-300) INJECTION 61% COMPARISON:  CXR from 06/30/2016, chest CT from 09/09/2011, CT chest, abdomen and pelvis from 09/09/2011 FINDINGS: CT CHEST FINDINGS Cardiovascular: Undo top normal size cardiac chambers without pericardial effusion. No large central pulmonary embolus. Aortic atherosclerosis without aneurysm. No dissection. Mild ectasia of the ascending aorta as before up to 3.9 cm. Normal branch pattern for the great vessels. Mediastinum/Nodes: Stable subcentimeter left-sided thyroid nodules since 2013 consistent with benign findings. The largest currently is approximately 12 mm. Fat containing right lower paratracheal lymph node measuring up to 13 mm is without significant change. Stable hilar lymph nodes, the largest on the right approximately 9 mm short axis and on the left, 8 mm short axis. Lungs/Pleura: Stable calcified and noncalcified subcentimeter pulmonary nodules dating back to 2013 consistent with benign findings, index nodule the left upper lobe measuring 7 mm, series 5, image 76. Bibasilar dependent atelectasis. There is lingular, right middle lobe and both lower lobe bronchiectasis with mild peribronchial thickening and chronic minimal parenchymal scarring and/or atelectasis. Musculoskeletal: Lower thoracic kyphosis attributable to multilevel degenerative disc disease. Small osteophytes are noted along the anterior aspect of the mid and lower thoracic spine. No acute nor suspicious osseous abnormalities. CT ABDOMEN PELVIS FINDINGS Hepatobiliary: Status post cholecystectomy. No space-occupying mass of the liver. No biliary dilatation. Pancreas: Unremarkable. No pancreatic ductal dilatation or surrounding inflammatory changes. Spleen: Normal in size without  focal  abnormality. Adrenals/Urinary Tract: Adrenal glands are unremarkable. Kidneys are normal, without renal calculi, focal lesion, or hydronephrosis. Bladder is unremarkable. Stomach/Bowel: Stomach is within normal limits. Appendix is surgically absent by report. No evidence of bowel wall thickening, distention, or inflammatory changes. Vascular/Lymphatic: Bandlike mesenteric soft tissue attenuation with calcification in the mid abdomen is slightly more prominent than on prior consistent with treated lymphoma. Small scattered mesenteric lymph nodes are seen in the right lower quadrant and root of the mesentery as before. No inguinal or pelvic sidewall adenopathy. No retroperitoneal or mesenteric lymphadenopathy. No porta hepatic nor epigastric enlargement of lymph nodes Reproductive: Prostate is unremarkable. Other: No abdominal wall hernia or abnormality. No abdominopelvic ascites. Musculoskeletal: No acute or significant osseous findings. Degenerative disc disease L4-5. IMPRESSION: 1. Chronic since 2013 are stable pulmonary nodules noted bilaterally, index nodule the left upper lobe measures 7 mm consistent with benign findings. 2. Hazy mesenteric fat in the mid abdomen with calcification consistent with treated non-Hodgkin's lymphoma. No recurrence of lymphadenopathy identified within chest, abdomen or pelvis. 3. Cholecystectomy. Electronically Signed   By: Ashley Royalty M.D.   On: 07/01/2016 20:12   Mr Brain Wo Contrast  Result Date: 07/01/2016 CLINICAL DATA:  Acute presentation with slurred speech and right-sided facial droop. Right upper and lower extremity weakness. EXAM: MRI HEAD WITHOUT CONTRAST TECHNIQUE: Multiplanar, multiecho pulse sequences of the brain and surrounding structures were obtained without intravenous contrast. COMPARISON:  Multiple CT studies done yesterday.  MRI 06/16/2016. FINDINGS: Brain: There is acute infarction affecting the left para median pons. No acute infarction affecting the  cerebral hemispheres. No focal cerebellar insult. Few old small vessel infarctions within the thalami I. Moderate chronic small-vessel ischemic changes of the cerebral hemispheric white matter. No cortical or large vessel territory infarction. No mass lesion, hemorrhage, hydrocephalus or extra-axial collection. Vascular: Major vessels at the base of the brain show flow. Skull and upper cervical spine: Negative Sinuses/Orbits: Clear except for mild maxillary sinus mucosal thickening and retention cyst. Orbits negative. Other: None significant IMPRESSION: Acute infarction affecting the left para median pons. No hemorrhage. Chronic small-vessel ischemic changes elsewhere as outlined above. Electronically Signed   By: Nelson Chimes M.D.   On: 07/01/2016 07:11   Mr Brain Wo Contrast  Result Date: 06/16/2016 CLINICAL DATA:  83 y/o  M; generalized weakness and fall. EXAM: MRI HEAD WITHOUT CONTRAST TECHNIQUE: Multiplanar, multiecho pulse sequences of the brain and surrounding structures were obtained without intravenous contrast. COMPARISON:  06/15/2016 CT of the head. FINDINGS: Brain: No acute infarction, hemorrhage, hydrocephalus, extra-axial collection or mass lesion. Foci of T2 FLAIR hyperintense signal abnormality in subcortical and periventricular white matter are nonspecific are compatible with moderate chronic microvascular ischemic changes of the brain. Mild brain parenchymal volume loss for age. Small chronic lacunar infarct within the left caudate head and hemosiderin stained chronic lacunar infarct in right lentiform nucleus. Vascular: Normal flow voids. Skull and upper cervical spine: Normal marrow signal. Sinuses/Orbits: Mild ethmoid and maxillary sinus mucosal thickening and large right maxillary sinus mucous retention cyst. No abnormal signal of the mastoid air cells. Right intra-ocular lens replacement. Other: None. IMPRESSION: 1. No acute intracranial abnormality. 2. Moderate chronic microvascular  ischemic changes and mild parenchymal volume loss of the brain. Small chronic lacunar infarcts within right lentiform nucleus and left caudate head. 3. Mild paranasal sinus disease. Electronically Signed   By: Kristine Garbe M.D.   On: 06/16/2016 19:20   Mr Cervical Spine Wo Contrast  Result Date: 06/17/2016 CLINICAL  DATA:  Progressive weakness.  Drooling. EXAM: MRI CERVICAL SPINE WITHOUT CONTRAST TECHNIQUE: Multiplanar, multisequence MR imaging of the cervical spine was performed. No intravenous contrast was administered. COMPARISON:  None. FINDINGS: Alignment: Exaggerated lordosis.  No listhesis. Vertebrae: No fracture, evidence of discitis, or bone lesion. Mild marrow edema about the left C4-5 facet, degenerative appearing. Cord: Normal signal and morphology. Posterior Fossa, vertebral arteries, paraspinal tissues: Bilateral thyroid nodules, up to 12 mm on the left, below size threshold for recommend sonographic follow-up. Disc levels: C2-3: Facet arthropathy with moderate spurring on the left. Ligamentum flavum thickening. Patent canal and foramina C3-4: Facet arthropathy with spurring greater on the left. Ligamentum flavum thickening. Mild endplate ridging. Patent canal and foramina C4-5: Facet arthropathy with moderate bilateral spurring. Ligamentum flavum thickening. No impingement C5-6: Facet arthropathy with mild spurring. Ligamentum flavum thickening. Disc narrowing with posterior disc osteophyte complex. No impingement C6-7: Mild facet spurring.  Mild disc narrowing.  No impingement C7-T1:Mild facet arthropathy with possible ankylosis on the right. Mild disc narrowing. No impingement IMPRESSION: 1. No explanation for weakness. The canal and foramina are diffusely patent. No evidence of myelopathy. 2. Facet arthropathy and mild for age disc degeneration. Mild degenerative marrow edema noted about the left C4-5 facet. Electronically Signed   By: Monte Fantasia M.D.   On: 06/17/2016 12:41    Ct Abdomen Pelvis W Contrast  Result Date: 07/01/2016 CLINICAL DATA:  Bladder cancer, non-Hodgkin's lymphoma and Barrett's esophagus. EXAM: CT CHEST, ABDOMEN, AND PELVIS WITH CONTRAST TECHNIQUE: Multidetector CT imaging of the chest, abdomen and pelvis was performed following the standard protocol during bolus administration of intravenous contrast. CONTRAST:  115mL ISOVUE-300 IOPAMIDOL (ISOVUE-300) INJECTION 61% COMPARISON:  CXR from 06/30/2016, chest CT from 09/09/2011, CT chest, abdomen and pelvis from 09/09/2011 FINDINGS: CT CHEST FINDINGS Cardiovascular: Undo top normal size cardiac chambers without pericardial effusion. No large central pulmonary embolus. Aortic atherosclerosis without aneurysm. No dissection. Mild ectasia of the ascending aorta as before up to 3.9 cm. Normal branch pattern for the great vessels. Mediastinum/Nodes: Stable subcentimeter left-sided thyroid nodules since 2013 consistent with benign findings. The largest currently is approximately 12 mm. Fat containing right lower paratracheal lymph node measuring up to 13 mm is without significant change. Stable hilar lymph nodes, the largest on the right approximately 9 mm short axis and on the left, 8 mm short axis. Lungs/Pleura: Stable calcified and noncalcified subcentimeter pulmonary nodules dating back to 2013 consistent with benign findings, index nodule the left upper lobe measuring 7 mm, series 5, image 76. Bibasilar dependent atelectasis. There is lingular, right middle lobe and both lower lobe bronchiectasis with mild peribronchial thickening and chronic minimal parenchymal scarring and/or atelectasis. Musculoskeletal: Lower thoracic kyphosis attributable to multilevel degenerative disc disease. Small osteophytes are noted along the anterior aspect of the mid and lower thoracic spine. No acute nor suspicious osseous abnormalities. CT ABDOMEN PELVIS FINDINGS Hepatobiliary: Status post cholecystectomy. No space-occupying mass of  the liver. No biliary dilatation. Pancreas: Unremarkable. No pancreatic ductal dilatation or surrounding inflammatory changes. Spleen: Normal in size without focal abnormality. Adrenals/Urinary Tract: Adrenal glands are unremarkable. Kidneys are normal, without renal calculi, focal lesion, or hydronephrosis. Bladder is unremarkable. Stomach/Bowel: Stomach is within normal limits. Appendix is surgically absent by report. No evidence of bowel wall thickening, distention, or inflammatory changes. Vascular/Lymphatic: Bandlike mesenteric soft tissue attenuation with calcification in the mid abdomen is slightly more prominent than on prior consistent with treated lymphoma. Small scattered mesenteric lymph nodes are seen in the right lower quadrant  and root of the mesentery as before. No inguinal or pelvic sidewall adenopathy. No retroperitoneal or mesenteric lymphadenopathy. No porta hepatic nor epigastric enlargement of lymph nodes Reproductive: Prostate is unremarkable. Other: No abdominal wall hernia or abnormality. No abdominopelvic ascites. Musculoskeletal: No acute or significant osseous findings. Degenerative disc disease L4-5. IMPRESSION: 1. Chronic since 2013 are stable pulmonary nodules noted bilaterally, index nodule the left upper lobe measures 7 mm consistent with benign findings. 2. Hazy mesenteric fat in the mid abdomen with calcification consistent with treated non-Hodgkin's lymphoma. No recurrence of lymphadenopathy identified within chest, abdomen or pelvis. 3. Cholecystectomy. Electronically Signed   By: Ashley Royalty M.D.   On: 07/01/2016 20:12   Ct Cerebral Perfusion W Contrast  Result Date: 06/30/2016 CLINICAL DATA:  Dysarthria.  Right-sided facial droop. EXAM: CT PERFUSION BRAIN TECHNIQUE: Multiphase CT imaging of the brain was performed following IV bolus contrast injection. Subsequent parametric perfusion maps were calculated using RAPID software. CONTRAST:  40 mL Isovue 370 COMPARISON:  None.  FINDINGS: CT Brain Perfusion Findings: CBF (<30%) Volume: 89mL Perfusion (Tmax>6.0s) volume: 57mL Mismatch Volume: 25mL Infarction Location:None No significant patient motion is present. Arterial input and venous output functions are excellent. IMPRESSION: Negative CT perfusion exam of the head. No evidence for acute infarct or ischemia. Electronically Signed   By: San Morelle M.D.   On: 06/30/2016 16:54   Dg Abd Portable 1v  Result Date: 07/02/2016 CLINICAL DATA:  Feeding tube placement. EXAM: PORTABLE ABDOMEN - 1 VIEW COMPARISON:  Abdomen and pelvis CT dated 07/01/2016. FINDINGS: Feeding tube tip in the mid stomach. Normal bowel gas pattern. Oral contrast in the right colon. Cholecystectomy clips. Thoracolumbar spine degenerative changes. IMPRESSION: Feeding tube tip in the mid stomach. Electronically Signed   By: Claudie Revering M.D.   On: 07/02/2016 15:57   Dg Swallowing Func-speech Pathology  Result Date: 07/05/2016 term5 Objective Swallowing Evaluation: Type of Study: MBS-Modified Barium Swallow Study Patient Details Name: SEBASTIAN LURZ MRN: [id number] Date of Birth: 1927-11-28 Today's Date: 07/05/2016 Time: SLP Start Time (ACUTE ONLY): 1300-SLP Stop Time (ACUTE ONLY): 1322 SLP Time Calculation (min) (ACUTE ONLY): 22 min Past Medical History: Past Medical History: Diagnosis Date . Arthritis  . Barrett esophagus  . Bladder cancer ([place]) dx'd 4098,1191  surg only . Colon polyps   adenomatous . Difficulty sleeping   OCCASIONALLY . Diverticulosis of colon (without mention of hemorrhage)  . Frequency of urination  . GERD (gastroesophageal reflux disease)  . Hiatal hernia  . History of bladder cancer 1981  EXCISION ONLY . History of skin cancer   BASAL CELL . Hyperlipidemia  . Lymphoma, non-Hodgkin's ([place]) 2011  [place]  . Macular degeneration   LEFT EYE . Multiple nodules of lung 09/16/2011 . PAF (paroxysmal atrial fibrillation) ([place])  . Personal history of colonic polyps 07/15/2010  tubular adenoma .  Prostate cancer [facility])   patient denies . PUD (peptic ulcer disease)  . Stomach cancer ([place])   NHL origin Past Surgical History: Past Surgical History: Procedure Laterality Date . APPENDECTOMY   . bone spur    left shoulder . CHOLECYSTECTOMY   . KNEE ARTHROPLASTY Left 07/30/2015  Procedure: LEFT TOTAL KNEE ARTHROPLASTY WITH COMPUTER NAVIGATION;  Surgeon: Rod Can, MD;  Location: [facility];  Service: Orthopedics;  Laterality: Left; . LASER OF PROSTATE W/ GREEN LIGHT PVP   . TENDON REPAIR    right arm . TRIGGER FINGER RELEASE    bilateral hands . WRIST SURGERY    Bilateral HPI: Ptis  an 83 y.o. malewith PMH significant of PAF, Barrett's esophagus, bladder cancer, non-Hodgkin's lymphoma, OA, andHLD; who presents with acute onset of slurred speech, right-sided facial droop, and right sided upper and lower extremity weakness. He had just been recently hospitalized from 3/14 -3/19 for community-acquired pneumonia. Due to swallowing difficulties patient was evaluated by speech therapy and seen to be silently aspirating. Patient was placed on modified diet (recommended Dys 3 solids, thin liquids). Upon discharge he was sent to a skilled nursing facilityfor rehabilitation. 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 No hemorrhage. Chronic small-vessel ischemic changes elsewhere. CXR showed some improvement in the appearance of the lungs especially upper lobes but coarse interstitial markings persist in the lower lobes. There is no discrete pneumonia. There is stablecardiomegaly without pulmonary vascular congestion.Thoracic aortic atherosclerosis. No Data Recorded Assessment / Plan / Recommendation CHL IP CLINICAL IMPRESSIONS 07/05/2016 Clinical Impression Mr. Blomquist presents for a repeat MBS (prior MBS was 07/01/2016) and demonstrates improvements in his swallowing function. He exhibits a moderate motor-based pharyngeal dysphagia characterized by moderate vallecular residue due to  reduced tongue base retraction. Reduced laryngeal elevation resulted in silent penetration to the level of the vocal cords with nectar thick liquids during the swallow and large sips of honey thick liquids after the swallow. Cued coughs and second swallows were intermittently effective in clearing penetrates, while the utilization of the suction was effective in clearing penetrates. A moderate amount of residue was still observed during this study, however it is improved from prior the MBS. MBS does not diagnose below the level of the UES however esophageal scan revealed what appears to be mild stasis in mid esophagus. Educated pt and daughter re: continued risk for aspiration, diet recommendations and reiterated importance/compliance for safe swallow precautions/compensatory strategies (second swallows, throat clear, use suction, upright posture and remain upright for minimum 45 min). Recommend Dys 1 (puree) solids, honey thick liquids (TEASPOON SIPS), meds crushed. ST will continue to f/u for dysphagia treatment and pharyngeal strengthening exercises.  SLP Visit Diagnosis Dysphagia, pharyngoesophageal phase (R13.14) Attention and concentration deficit following -- Frontal lobe and executive function deficit following -- Impact on safety and function Moderate aspiration risk   CHL IP TREATMENT RECOMMENDATION 07/05/2016 Treatment Recommendations Therapy as outlined in treatment plan below   Prognosis 07/05/2016 Prognosis for Safe Diet Advancement Good Barriers to Reach Goals Severity of deficits Barriers/Prognosis Comment -- CHL IP DIET RECOMMENDATION 07/05/2016 SLP Diet Recommendations Dysphagia 1 (Puree) solids;Honey thick liquids;Other (Comment) Liquid Administration via Spoon Medication Administration Crushed with puree Compensations Slow rate;Small sips/bites;Clear throat after each swallow;Multiple dry swallows after each bite/sip Postural Changes Seated upright at 90 degrees   CHL IP OTHER RECOMMENDATIONS 07/05/2016  Recommended Consults -- Oral Care Recommendations Oral care BID Other Recommendations Order thickener from pharmacy;Have oral suction available   CHL IP FOLLOW UP RECOMMENDATIONS 07/05/2016 Follow up Recommendations Skilled Nursing facility   [facility] IP FREQUENCY AND DURATION 07/05/2016 Speech Therapy Frequency (ACUTE ONLY) min 2x/week Treatment Duration 2 weeks      CHL IP ORAL PHASE 07/05/2016 Oral Phase WFL Oral - Pudding Teaspoon -- Oral - Pudding Cup -- Oral - Honey Teaspoon -- Oral - Honey Cup WFL Oral - Nectar Teaspoon -- Oral - Nectar Cup WFL Oral - Nectar Straw -- Oral - Thin Teaspoon -- Oral - Thin Cup NT Oral - Thin Straw -- Oral - Puree WFL Oral - Mech Soft NT Oral - Regular -- Oral - Multi-Consistency -- Oral - Pill --  Oral Phase - Comment --  CHL IP PHARYNGEAL PHASE 07/05/2016 Pharyngeal Phase Impaired Pharyngeal- Pudding Teaspoon -- Pharyngeal -- Pharyngeal- Pudding Cup -- Pharyngeal -- Pharyngeal- Honey Teaspoon -- Pharyngeal -- Pharyngeal- Honey Cup Pharyngeal residue - valleculae;Reduced tongue base retraction;Penetration/Apiration after swallow;Reduced laryngeal elevation Pharyngeal Other (Comment);Material enters airway, CONTACTS cords and then ejected out Pharyngeal- Nectar Teaspoon -- Pharyngeal -- Pharyngeal- Nectar Cup Penetration/Aspiration during swallow;Reduced laryngeal elevation;Pharyngeal residue - valleculae;Reduced tongue base retraction Pharyngeal Material enters airway, remains ABOVE vocal cords then ejected out;Material enters airway, CONTACTS cords and not ejected out Pharyngeal- Nectar Straw -- Pharyngeal -- Pharyngeal- Thin Teaspoon -- Pharyngeal -- Pharyngeal- Thin Cup NT Pharyngeal -- Pharyngeal- Thin Straw -- Pharyngeal -- Pharyngeal- Puree Pharyngeal residue - valleculae;Reduced tongue base retraction Pharyngeal Material does not enter airway Pharyngeal- Mechanical Soft NT Pharyngeal -- Pharyngeal- Regular -- Pharyngeal -- Pharyngeal- Multi-consistency -- Pharyngeal -- Pharyngeal- Pill  -- Pharyngeal -- Pharyngeal Comment --  CHL IP CERVICAL ESOPHAGEAL PHASE 07/05/2016 Cervical Esophageal Phase WFL Pudding Teaspoon -- Pudding Cup -- Honey Teaspoon -- Honey Cup -- Nectar Teaspoon -- Nectar Cup -- Nectar Straw -- Thin Teaspoon -- Thin Cup -- Thin Straw -- Puree -- Mechanical Soft -- Regular -- Multi-consistency -- Pill -- Cervical Esophageal Comment -- CHL IP GO 06/16/2016 Functional Assessment Tool Used skilled clinical judgement Functional Limitations Swallowing Swallow Current Status (F8182) CK Swallow Goal Status (X9371) CJ Swallow Discharge Status (I9678) (None) Motor Speech Current Status (L3810) (None) Motor Speech Goal Status (F7510) (None) Motor Speech Goal Status (C5852) (None) Spoken Language Comprehension Current Status (D7824) (None) Spoken Language Comprehension Goal Status (M3536) (None) Spoken Language Comprehension Discharge Status (R4431) (None) Spoken Language Expression Current Status (V4008) (None) Spoken Language Expression Goal Status (Q7619) (None) Spoken Language Expression Discharge Status [phone] (None) Attention Current Status (I7124) (None) Attention Goal Status (P8099) (None) Attention Discharge Status (I3382) (None) Memory Current Status (N0539) (None) Memory Goal Status (J6734) (None) Memory Discharge Status (L9379) (None) Voice Current Status (K2409) (None) Voice Goal Status (B3532) (None) Voice Discharge Status (D9242) (None) Other Speech-Language Pathology Functional Limitation Current Status (A8341) (None) Other Speech-Language Pathology Functional Limitation Goal Status (D6222) (None) Other Speech-Language Pathology Functional Limitation Discharge Status [phone]) (None) [name] 07/05/2016, 3:18 PM  [name] [name].Ed CCC-SLP Pager [phone]             Dg Swallowing Func-speech Pathology  Result Date: 07/01/2016 Objective Swallowing Evaluation: Type of Study: MBS-Modified Barium Swallow Study Patient Details Name: JALEAL SCHLIEP MRN: [id number] Date  of Birth: Sep 03, 1927 Today's Date: 07/01/2016 Time: SLP Start Time (ACUTE ONLY): 0910-SLP Stop Time (ACUTE ONLY): 0933 SLP Time Calculation (min) (ACUTE ONLY): 23 min Past Medical History: Past Medical History: Diagnosis Date . Arthritis  . Barrett esophagus  . Bladder cancer ([place]) dx'd 5631,4970  surg only . Colon polyps   adenomatous . Difficulty sleeping   OCCASIONALLY . Diverticulosis of colon (without mention of hemorrhage)  . Frequency of urination  . GERD (gastroesophageal reflux disease)  . Hiatal hernia  . History of bladder cancer 1981  EXCISION ONLY . History of skin cancer   BASAL CELL . Hyperlipidemia  . Lymphoma, non-Hodgkin's ([place]) 2011  [place]  . Macular degeneration   LEFT EYE . Multiple nodules of lung 09/16/2011 . PAF (paroxysmal atrial fibrillation) ([place])  . Personal history of colonic polyps 07/15/2010  tubular adenoma . Prostate cancer [facility])   patient denies . PUD (peptic ulcer disease)  . Stomach cancer ([place])   NHL origin  Past Surgical History: Past Surgical History: Procedure Laterality Date . APPENDECTOMY   . bone spur    left shoulder . CHOLECYSTECTOMY   . KNEE ARTHROPLASTY Left 07/30/2015  Procedure: LEFT TOTAL KNEE ARTHROPLASTY WITH COMPUTER NAVIGATION;  Surgeon: Rod Can, MD;  Location: [facility];  Service: Orthopedics;  Laterality: Left; . LASER OF PROSTATE W/ GREEN LIGHT PVP   . TENDON REPAIR    right arm . TRIGGER FINGER RELEASE    bilateral hands . WRIST SURGERY    Bilateral HPI: Ptis an 83 y.o. malewith PMH significant of PAF, Barrett's esophagus, bladder cancer, non-Hodgkin's lymphoma, OA, andHLD; who presents with acute onset of slurred speech, right-sided facial droop, and right sided upper and lower extremity weakness. He had just been recently hospitalized from 3/14 -3/19 for community-acquired pneumonia. Due to swallowing difficulties patient was evaluated by speech therapy and seen to be silently aspirating. Patient was placed on modified diet (recommended Dys 3  solids, thin liquids). Upon discharge he was sent to a skilled nursing facilityfor rehabilitation. 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 No hemorrhage. Chronic small-vessel ischemic changes elsewhere. CXR showed some improvement in the appearance of the lungs especially upper lobes but coarse interstitial markings persist in the lower lobes. There is no discrete pneumonia. There is stablecardiomegaly without pulmonary vascular congestion.Thoracic aortic atherosclerosis. No Data Recorded Assessment / Plan / Recommendation CHL IP CLINICAL IMPRESSIONS 07/01/2016 Clinical Impression Pt has had a decline in swallow function this admission following acute pontine CVA. Impairments primarily motor based with decreased laryngeal elevation, laryngeal closure and reduced tongue base retraction. Silent penetration and aspiration during initial and multiple swallows with nectar and honey thick consistencies. Chin tuck was ineffective to mitigate laryngeal invasion or to decrease vallecular residue during swallow. Pt required multiple verbal prompts for cough/throat clear which were weak and unable to clear vestibule. Although puree was not penetrated or aspirated, volume was significant and remained increasing risk. MBS does not diagnose below the level of the UES, however esophageal scan revealed stasis in distal esophagus. Recommend NPO status, oral care, short term alternative nutrition and continue ST intervention. From ST/swallow standpoint, pt is an excellent candidate for inpatient rehab.   SLP Visit Diagnosis Dysphagia, oropharyngeal phase (R13.12) Attention and concentration deficit following -- Frontal lobe and executive function deficit following -- Impact on safety and function --   CHL IP TREATMENT RECOMMENDATION 07/01/2016 Treatment Recommendations Therapy as outlined in treatment plan below   Prognosis 07/01/2016 Prognosis for Safe Diet Advancement Good Barriers to Reach  Goals -- Barriers/Prognosis Comment -- CHL IP DIET RECOMMENDATION 07/01/2016 SLP Diet Recommendations NPO Liquid Administration via -- Medication Administration Via alternative means Compensations -- Postural Changes --   CHL IP OTHER RECOMMENDATIONS 07/01/2016 Recommended Consults -- Oral Care Recommendations Oral care QID Other Recommendations --   CHL IP FOLLOW UP RECOMMENDATIONS 07/01/2016 Follow up Recommendations Inpatient Rehab   CHL IP FREQUENCY AND DURATION 07/01/2016 Speech Therapy Frequency (ACUTE ONLY) min 2x/week Treatment Duration 2 weeks      CHL IP ORAL PHASE 07/01/2016 Oral Phase Impaired Oral - Pudding Teaspoon -- Oral - Pudding Cup -- Oral - Honey Teaspoon -- Oral - Honey Cup Lingual/palatal residue Oral - Nectar Teaspoon -- Oral - Nectar Cup Lingual/palatal residue Oral - Nectar Straw -- Oral - Thin Teaspoon -- Oral - Thin Cup Lingual/palatal residue Oral - Thin Straw -- Oral - Puree Lingual/palatal residue Oral - Mech Soft NT Oral - Regular -- Oral - Multi-Consistency --  Oral - Pill -- Oral Phase - Comment --  CHL IP PHARYNGEAL PHASE 07/01/2016 Pharyngeal Phase Impaired Pharyngeal- Pudding Teaspoon -- Pharyngeal -- Pharyngeal- Pudding Cup -- Pharyngeal -- Pharyngeal- Honey Teaspoon -- Pharyngeal -- Pharyngeal- Honey Cup Pharyngeal residue - valleculae;Reduced tongue base retraction;Penetration/Aspiration during swallow;Reduced laryngeal elevation;Reduced airway/laryngeal closure Pharyngeal Material enters airway, passes BELOW cords without attempt by patient to eject out (silent aspiration);Material enters airway, CONTACTS cords and not ejected out Pharyngeal- Nectar Teaspoon -- Pharyngeal -- Pharyngeal- Nectar Cup Delayed swallow initiation-vallecula;Pharyngeal residue - valleculae;Reduced tongue base retraction;Reduced laryngeal elevation;Penetration/Aspiration during swallow;Reduced airway/laryngeal closure;Pharyngeal residue - pyriform Pharyngeal Material enters airway, passes BELOW cords without  attempt by patient to eject out (silent aspiration);Material enters airway, CONTACTS cords and not ejected out Pharyngeal- Nectar Straw -- Pharyngeal -- Pharyngeal- Thin Teaspoon -- Pharyngeal -- Pharyngeal- Thin Cup NT Pharyngeal -- Pharyngeal- Thin Straw -- Pharyngeal -- Pharyngeal- Puree Pharyngeal residue - valleculae;Reduced tongue base retraction Pharyngeal -- Pharyngeal- Mechanical Soft NT Pharyngeal -- Pharyngeal- Regular -- Pharyngeal -- Pharyngeal- Multi-consistency -- Pharyngeal -- Pharyngeal- Pill -- Pharyngeal -- Pharyngeal Comment --  CHL IP CERVICAL ESOPHAGEAL PHASE 07/01/2016 Cervical Esophageal Phase WFL Pudding Teaspoon -- Pudding Cup -- Honey Teaspoon -- Honey Cup -- Nectar Teaspoon -- Nectar Cup -- Nectar Straw -- Thin Teaspoon -- Thin Cup -- Thin Straw -- Puree -- Mechanical Soft -- Regular -- Multi-consistency -- Pill -- Cervical Esophageal Comment -- CHL IP GO 06/16/2016 Functional Assessment Tool Used skilled clinical judgement Functional Limitations Swallowing Swallow Current Status (W7371) CK Swallow Goal Status (G6269) CJ Swallow Discharge Status (S8546) (None) Motor Speech Current Status (E7035) (None) Motor Speech Goal Status (K0938) (None) Motor Speech Goal Status (H8299) (None) Spoken Language Comprehension Current Status (B7169) (None) Spoken Language Comprehension Goal Status (C7893) (None) Spoken Language Comprehension Discharge Status (Y1017) (None) Spoken Language Expression Current Status (P1025) (None) Spoken Language Expression Goal Status (E5277) (None) Spoken Language Expression Discharge Status [phone]) (None) Attention Current Status (N3614) (None) Attention Goal Status (E3154) (None) Attention Discharge Status (M0867) (None) Memory Current Status (Y1950) (None) Memory Goal Status (D3267) (None) Memory Discharge Status (T2458) (None) Voice Current Status (K9983) (None) Voice Goal Status (J8250) (None) Voice Discharge Status (N3976) (None) Other Speech-Language Pathology  Functional Limitation Current Status (B3419) (None) Other Speech-Language Pathology Functional Limitation Goal Status (F7902) (None) Other Speech-Language Pathology Functional Limitation Discharge Status [phone]) (None) [name] 07/01/2016, 11:26 AM [name] [name].Ed CCC-SLP Pager [phone]              Dg Swallowing Func-speech Pathology  Result Date: 06/17/2016 Objective Swallowing Evaluation: Type of Study: MBS-Modified Barium Swallow Study Patient Details Name: DUSTINE STICKLER MRN: [id number] Date of Birth: 1928-02-11 Today's Date: 06/17/2016 Time: SLP Start Time (ACUTE ONLY): 1230-SLP Stop Time (ACUTE ONLY): 1300 SLP Time Calculation (min) (ACUTE ONLY): 30 min Past Medical History: Past Medical History: Diagnosis Date . Arthritis  . Barrett esophagus  . Bladder cancer ([place]) dx'd 2297,9892  surg only . Colon polyps   adenomatous . Difficulty sleeping   OCCASIONALLY . Diverticulosis of colon (without mention of hemorrhage)  . Frequency of urination  . GERD (gastroesophageal reflux disease)  . Hiatal hernia  . History of bladder cancer 1981  EXCISION ONLY . History of skin cancer   BASAL CELL . Hyperlipidemia  . Lymphoma, non-Hodgkin's ([place]) 2011  [place]  . Macular degeneration   LEFT EYE . Multiple nodules of lung 09/16/2011 . PAF (paroxysmal atrial fibrillation) ([place])  . Personal history of colonic  polyps 07/15/2010  tubular adenoma . Prostate cancer [facility])   patient denies . PUD (peptic ulcer disease)  . Stomach cancer ([place])   NHL origin Past Surgical History: Past Surgical History: Procedure Laterality Date . APPENDECTOMY   . bone spur    left shoulder . CHOLECYSTECTOMY   . KNEE ARTHROPLASTY Left 07/30/2015  Procedure: LEFT TOTAL KNEE ARTHROPLASTY WITH COMPUTER NAVIGATION;  Surgeon: Rod Can, MD;  Location: [facility];  Service: Orthopedics;  Laterality: Left; . LASER OF PROSTATE W/ GREEN LIGHT PVP   . TENDON REPAIR    right arm . TRIGGER FINGER RELEASE    bilateral hands . WRIST SURGERY     Bilateral HPI: Tc Kapusta Wilsonis a 83 y.o.malewith medical history significant for Paroxysmal AFib, GERD, hiatal hernia, Barrett's esophagus, multiple lung nodules, bladder CA, non-Hodgkin's lymphoma, OA, hyperlipidemia who presented to the ED with c/o generalized weakness and fall. Chest x-ray showed possible CHF with superimposed pneumonia in the middle left lung base. Head CT showed no acute abnormalities, small vessel ischemic disease white matter changes,and ethmoidal paranasal sinus disease. Barium esophagram poor esophageal motility. No stricture or mass. Barium tablet passed readily into the stomach. Negative for reflux. No Data Recorded Assessment / Plan / Recommendation CHL IP CLINICAL IMPRESSIONS 06/17/2016 Clinical Impression  Pt demonstrates a moderate oropharyngeal dysphagia with neuromuscular deficits impacting sensory and motor function. Pt observed to have bilateral ptosis, anterior spillage of secretions on the left (family reports history of left facial droop due to Bell's palsy) and weak cough strength upon arrival to exam. Despite deficits pt apepars conginitively intact and is able to follow complex commands.  Oral function chaacteraized by mild lingual weakness observed via mild lingual residuals, with functional abiltiy to masticate and transit soft solids (though endurance not taxed). Flash penetration and silent aspriation during the swallow occured with boluses larger than 51ml (estimate) due to slow laryngeal closure and epiglottic deflection. Pt was able trigger a timely swallow and eventually achieve full laryngeal closure which expelled penetrate when possible, but again sluggish movement kept vestible partialy open during bolus passage. Further silent aspiration events also occured after the swallow if pt was cued to cough, which spilled valleuular residual into the airway. Attempted chin tuck without improvement and mendelsohn with visual biofeedback (pt unable after 5 attempts  with max cues). Best methods included verbal cues for small sips (bolus size consistently comparable in appearance and fucntion with 24mL teaspoon), oral hold and effortful swallow x2. Given increased residuals with thicker textures, recommend pt initiate a dys 2/thin liquid diet. Recommend water only with oral care at least 3x day with toothbrush and paste. Pt will benefit from exercises to target hyolaryngeal excursion and base of tongue strength as well as EMST. Will f/u for education with family.  SLP Visit Diagnosis Dysphagia, oropharyngeal phase (R13.12) Attention and concentration deficit following -- Frontal lobe and executive function deficit following -- Impact on safety and function --   CHL IP TREATMENT RECOMMENDATION 06/17/2016 Treatment Recommendations Therapy as outlined in treatment plan below   Prognosis 06/17/2016 Prognosis for Safe Diet Advancement Good Barriers to Reach Goals -- Barriers/Prognosis Comment -- CHL IP DIET RECOMMENDATION 06/17/2016 SLP Diet Recommendations Dysphagia 2 (Fine chop) solids;Thin liquid Liquid Administration via Cup Medication Administration Whole meds with puree Compensations Slow rate;Small sips/bites;Effortful swallow;Minimize environmental distractions;Clear throat intermittently Postural Changes Remain semi-upright after after feeds/meals (Comment)   CHL IP OTHER RECOMMENDATIONS 06/17/2016 Recommended Consults -- Oral Care Recommendations Oral care before and after PO Other Recommendations --  CHL IP FOLLOW UP RECOMMENDATIONS 06/17/2016 Follow up Recommendations 24 hour supervision/assistance   CHL IP FREQUENCY AND DURATION 06/17/2016 Speech Therapy Frequency (ACUTE ONLY) min 3x week Treatment Duration 2 weeks      CHL IP ORAL PHASE 06/17/2016 Oral Phase Impaired Oral - Pudding Teaspoon -- Oral - Pudding Cup -- Oral - Honey Teaspoon -- Oral - Honey Cup -- Oral - Nectar Teaspoon -- Oral - Nectar Cup -- Oral - Nectar Straw -- Oral - Thin Teaspoon -- Oral - Thin Cup Left  anterior bolus loss;Lingual/palatal residue Oral - Thin Straw -- Oral - Puree WFL Oral - Mech Soft WFL Oral - Regular -- Oral - Multi-Consistency -- Oral - Pill -- Oral Phase - Comment --  CHL IP PHARYNGEAL PHASE 06/17/2016 Pharyngeal Phase Impaired Pharyngeal- Pudding Teaspoon -- Pharyngeal -- Pharyngeal- Pudding Cup -- Pharyngeal -- Pharyngeal- Honey Teaspoon -- Pharyngeal -- Pharyngeal- Honey Cup -- Pharyngeal -- Pharyngeal- Nectar Teaspoon -- Pharyngeal -- Pharyngeal- Nectar Cup -- Pharyngeal -- Pharyngeal- Nectar Straw -- Pharyngeal -- Pharyngeal- Thin Teaspoon -- Pharyngeal -- Pharyngeal- Thin Cup Reduced anterior laryngeal mobility;Penetration/Aspiration during swallow;Reduced tongue base retraction;Penetration/Apiration after swallow;Moderate aspiration;Trace aspiration;Pharyngeal residue - valleculae;Compensatory strategies attempted (with notebox) Pharyngeal Material enters airway, passes BELOW cords without attempt by patient to eject out (silent aspiration);Material enters airway, CONTACTS cords and then ejected out;Material does not enter airway Pharyngeal- Thin Straw -- Pharyngeal -- Pharyngeal- Puree Reduced anterior laryngeal mobility;Reduced tongue base retraction;Pharyngeal residue - valleculae;Compensatory strategies attempted (with notebox) Pharyngeal -- Pharyngeal- Mechanical Soft Reduced anterior laryngeal mobility;Reduced tongue base retraction;Pharyngeal residue - valleculae;Compensatory strategies attempted (with notebox) Pharyngeal -- Pharyngeal- Regular -- Pharyngeal -- Pharyngeal- Multi-consistency -- Pharyngeal -- Pharyngeal- Pill -- Pharyngeal -- Pharyngeal Comment --  No flowsheet data found. CHL IP GO 06/16/2016 Functional Assessment Tool Used skilled clinical judgement Functional Limitations Swallowing Swallow Current Status [phone]) CK Swallow Goal Status (S1779) [place] Swallow Discharge Status [phone]) (None) Motor Speech Current Status [phone]) (None) Motor Speech Goal Status (A0762)  (None) Motor Speech Goal Status (U6333) (None) Spoken Language Comprehension Current Status (L4562) (None) Spoken Language Comprehension Goal Status (B6389) (None) Spoken Language Comprehension Discharge Status [phone]) (None) Spoken Language Expression Current Status [phone]) (None) Spoken Language Expression Goal Status [phone]) (None) Spoken Language Expression Discharge Status [phone]) (None) Attention Current Status (H7416) (None) Attention Goal Status (L8453) (None) Attention Discharge Status [phone]) (None) Memory Current Status (H2122) (None) Memory Goal Status (Q8250) (None) Memory Discharge Status (I3704) (None) Voice Current Status (U8891) (None) Voice Goal Status (Q9450) (None) Voice Discharge Status (T8882) (None) Other Speech-Language Pathology Functional Limitation Current Status (C0034) (None) Other Speech-Language Pathology Functional Limitation Goal Status (J1791) (None) Other Speech-Language Pathology Functional Limitation Discharge Status [phone]) (None) DeBlois, Katherene Ponto 06/17/2016, 2:21 PM              Ct Head Code Stroke W/o Cm  Addendum Date: 06/30/2016   ADDENDUM REPORT: 06/30/2016 16:06 ADDENDUM: Study discussed by telephone on 06/30/2016 with Dr. Harrell Gave Tegeler, at 1534 hours. Electronically Signed   By: Genevie Ann M.D.   On: 06/30/2016 16:06   Result Date: 06/30/2016 CLINICAL DATA:  Code stroke. 83 year old male with episode of right leg weakness slurred speech and right facial droop which lasted about 1 hour today. Facial droop persists but other symptoms have resolved. Initial encounter. EXAM: CT HEAD WITHOUT CONTRAST TECHNIQUE: Contiguous axial images were obtained from the base of the skull through the vertex without intravenous contrast. COMPARISON:  Brain MRI 06/16/2016 and earlier. FINDINGS: Brain: Stable cerebral volume.  No midline shift, mass effect, or evidence of intracranial mass lesion. No ventriculomegaly. No acute intracranial hemorrhage identified. Stable gray-white  matter differentiation throughout the brain. Chronic white matter hypodensity appears stable. No cortically based acute infarct identified. Vascular: Calcified atherosclerosis at the skull base. No suspicious intracranial vascular hyperdensity. Skull: No acute osseous abnormality identified. Sinuses/Orbits: Visualized paranasal sinuses and mastoids are stable and well pneumatized. Other: No acute orbit or scalp soft tissue findings. ASPECTS ([place] Stroke Program Early CT Score) - Ganglionic level infarction (caudate, lentiform nuclei, internal capsule, insula, M1-M3 cortex): 7 - Supraganglionic infarction (M4-M6 cortex): 3 Total score (0-10 with 10 being normal): 10 IMPRESSION: 1. Stable non contrast CT appearance of the brain, no acute intracranial abnormality. 2. ASPECTS is 10. 3. Chronic white matter changes. Electronically Signed: By: Genevie Ann M.D. On: 06/30/2016 15:24     CBC  Recent Labs Lab 06/30/16 1524 06/30/16 1530 07/02/16 0403 07/05/16 0408  WBC 8.2  --  6.2 6.6  HGB 12.1* 12.9* 11.9* 11.3*  HCT 38.3* 38.0* 37.4* 34.9*  PLT 194  --  160 138*  MCV 85.5  --  84.6 84.3  MCH 27.0  --  26.9 27.3  MCHC 31.6  --  31.8 32.4  RDW 13.5  --  13.4 13.8  LYMPHSABS 1.7  --   --   --   MONOABS 0.5  --   --   --   EOSABS 0.1  --   --   --   BASOSABS 0.0  --   --   --     Chemistries   Recent Labs Lab 06/30/16 1524 06/30/16 1530 07/03/16 0346 07/05/16 0408 07/06/16 0349  NA 140 142 138 137 137  K 3.9 3.8 3.3* 3.2* 3.3*  CL 106 105 109 108 106  CO2 25  --  21* 23 24  GLUCOSE 103* 98 129* 98 93  BUN 28* 30* 26* 18 13  CREATININE 1.21 1.10 1.06 0.88 0.83  CALCIUM 9.1  --  8.5* 8.3* 8.6*  AST 23  --   --  34  --   ALT 12*  --   --  19  --   ALKPHOS 30*  --   --  25*  --   BILITOT 0.8  --   --  0.6  --    ------------------------------------------------------------------------------------------------------------------ estimated creatinine clearance is 77.8 mL/min (by C-G  formula based on SCr of 0.83 mg/dL). ------------------------------------------------------------------------------------------------------------------ No results for input(s): HGBA1C in the last 72 hours. ------------------------------------------------------------------------------------------------------------------ No results for input(s): CHOL, HDL, LDLCALC, TRIG, CHOLHDL, LDLDIRECT in the last 72 hours. ------------------------------------------------------------------------------------------------------------------ No results for input(s): TSH, T4TOTAL, T3FREE, THYROIDAB in the last 72 hours.  Invalid input(s): FREET3 ------------------------------------------------------------------------------------------------------------------ No results for input(s): VITAMINB12, FOLATE, FERRITIN, TIBC, IRON, RETICCTPCT in the last 72 hours.  Coagulation profile  Recent Labs Lab 06/30/16 1524  INR 1.58    No results for input(s): DDIMER in the last 72 hours.  Cardiac Enzymes No results for input(s): CKMB, TROPONINI, MYOGLOBIN in the last 168 hours.  Invalid input(s): CK ------------------------------------------------------------------------------------------------------------------ Invalid input(s): POCBNP   CBG:  Recent Labs Lab 07/05/16 1615 07/05/16 2057 07/06/16 0113 07/06/16 0443 07/06/16 0831  GLUCAP 120* 102* 100* 89 102*       Studies: Dg Swallowing Func-speech Pathology  Result Date: 07/05/2016 term5 Objective Swallowing Evaluation: Type of Study: MBS-Modified Barium Swallow Study Patient Details Name: ARIANNA DELSANTO MRN: [id number] Date of Birth: Apr 24, 1927 Today's Date: 07/05/2016 Time: SLP Start Time (ACUTE ONLY): 1300-SLP Stop  Time (ACUTE ONLY): 1322 SLP Time Calculation (min) (ACUTE ONLY): 22 min Past Medical History: Past Medical History: Diagnosis Date . Arthritis  . Barrett esophagus  . Bladder cancer ([place]) dx'd 4696,2952  surg only . Colon polyps   adenomatous  . Difficulty sleeping   OCCASIONALLY . Diverticulosis of colon (without mention of hemorrhage)  . Frequency of urination  . GERD (gastroesophageal reflux disease)  . Hiatal hernia  . History of bladder cancer 1981  EXCISION ONLY . History of skin cancer   BASAL CELL . Hyperlipidemia  . Lymphoma, non-Hodgkin's ([place]) 2011  [place]  . Macular degeneration   LEFT EYE . Multiple nodules of lung 09/16/2011 . PAF (paroxysmal atrial fibrillation) ([place])  . Personal history of colonic polyps 07/15/2010  tubular adenoma . Prostate cancer [facility])   patient denies . PUD (peptic ulcer disease)  . Stomach cancer ([place])   NHL origin Past Surgical History: Past Surgical History: Procedure Laterality Date . APPENDECTOMY   . bone spur    left shoulder . CHOLECYSTECTOMY   . KNEE ARTHROPLASTY Left 07/30/2015  Procedure: LEFT TOTAL KNEE ARTHROPLASTY WITH COMPUTER NAVIGATION;  Surgeon: Rod Can, MD;  Location: [facility];  Service: Orthopedics;  Laterality: Left; . LASER OF PROSTATE W/ GREEN LIGHT PVP   . TENDON REPAIR    right arm . TRIGGER FINGER RELEASE    bilateral hands . WRIST SURGERY    Bilateral HPI: Ptis an 83 y.o. malewith PMH significant of PAF, Barrett's esophagus, bladder cancer, non-Hodgkin's lymphoma, OA, andHLD; who presents with acute onset of slurred speech, right-sided facial droop, and right sided upper and lower extremity weakness. He had just been recently hospitalized from 3/14 -3/19 for community-acquired pneumonia. Due to swallowing difficulties patient was evaluated by speech therapy and seen to be silently aspirating. Patient was placed on modified diet (recommended Dys 3 solids, thin liquids). Upon discharge he was sent to a skilled nursing facilityfor rehabilitation. 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 No hemorrhage. Chronic small-vessel ischemic changes elsewhere. CXR showed some improvement in the appearance of the lungs especially upper lobes but coarse  interstitial markings persist in the lower lobes. There is no discrete pneumonia. There is stablecardiomegaly without pulmonary vascular congestion.Thoracic aortic atherosclerosis. No Data Recorded Assessment / Plan / Recommendation CHL IP CLINICAL IMPRESSIONS 07/05/2016 Clinical Impression Mr. Dollins presents for a repeat MBS (prior MBS was 07/01/2016) and demonstrates improvements in his swallowing function. He exhibits a moderate motor-based pharyngeal dysphagia characterized by moderate vallecular residue due to reduced tongue base retraction. Reduced laryngeal elevation resulted in silent penetration to the level of the vocal cords with nectar thick liquids during the swallow and large sips of honey thick liquids after the swallow. Cued coughs and second swallows were intermittently effective in clearing penetrates, while the utilization of the suction was effective in clearing penetrates. A moderate amount of residue was still observed during this study, however it is improved from prior the MBS. MBS does not diagnose below the level of the UES however esophageal scan revealed what appears to be mild stasis in mid esophagus. Educated pt and daughter re: continued risk for aspiration, diet recommendations and reiterated importance/compliance for safe swallow precautions/compensatory strategies (second swallows, throat clear, use suction, upright posture and remain upright for minimum 45 min). Recommend Dys 1 (puree) solids, honey thick liquids (TEASPOON SIPS), meds crushed. ST will continue to f/u for dysphagia treatment and pharyngeal strengthening exercises.  SLP Visit Diagnosis Dysphagia, pharyngoesophageal phase (  R13.14) Attention and concentration deficit following -- Frontal lobe and executive function deficit following -- Impact on safety and function Moderate aspiration risk   CHL IP TREATMENT RECOMMENDATION 07/05/2016 Treatment Recommendations Therapy as outlined in treatment plan below   Prognosis 07/05/2016  Prognosis for Safe Diet Advancement Good Barriers to Reach Goals Severity of deficits Barriers/Prognosis Comment -- CHL IP DIET RECOMMENDATION 07/05/2016 SLP Diet Recommendations Dysphagia 1 (Puree) solids;Honey thick liquids;Other (Comment) Liquid Administration via Spoon Medication Administration Crushed with puree Compensations Slow rate;Small sips/bites;Clear throat after each swallow;Multiple dry swallows after each bite/sip Postural Changes Seated upright at 90 degrees   CHL IP OTHER RECOMMENDATIONS 07/05/2016 Recommended Consults -- Oral Care Recommendations Oral care BID Other Recommendations Order thickener from pharmacy;Have oral suction available   CHL IP FOLLOW UP RECOMMENDATIONS 07/05/2016 Follow up Recommendations Skilled Nursing facility   [facility] IP FREQUENCY AND DURATION 07/05/2016 Speech Therapy Frequency (ACUTE ONLY) min 2x/week Treatment Duration 2 weeks      CHL IP ORAL PHASE 07/05/2016 Oral Phase WFL Oral - Pudding Teaspoon -- Oral - Pudding Cup -- Oral - Honey Teaspoon -- Oral - Honey Cup WFL Oral - Nectar Teaspoon -- Oral - Nectar Cup WFL Oral - Nectar Straw -- Oral - Thin Teaspoon -- Oral - Thin Cup NT Oral - Thin Straw -- Oral - Puree WFL Oral - Mech Soft NT Oral - Regular -- Oral - Multi-Consistency -- Oral - Pill -- Oral Phase - Comment --  CHL IP PHARYNGEAL PHASE 07/05/2016 Pharyngeal Phase Impaired Pharyngeal- Pudding Teaspoon -- Pharyngeal -- Pharyngeal- Pudding Cup -- Pharyngeal -- Pharyngeal- Honey Teaspoon -- Pharyngeal -- Pharyngeal- Honey Cup Pharyngeal residue - valleculae;Reduced tongue base retraction;Penetration/Apiration after swallow;Reduced laryngeal elevation Pharyngeal Other (Comment);Material enters airway, CONTACTS cords and then ejected out Pharyngeal- Nectar Teaspoon -- Pharyngeal -- Pharyngeal- Nectar Cup Penetration/Aspiration during swallow;Reduced laryngeal elevation;Pharyngeal residue - valleculae;Reduced tongue base retraction Pharyngeal Material enters airway, remains ABOVE  vocal cords then ejected out;Material enters airway, CONTACTS cords and not ejected out Pharyngeal- Nectar Straw -- Pharyngeal -- Pharyngeal- Thin Teaspoon -- Pharyngeal -- Pharyngeal- Thin Cup NT Pharyngeal -- Pharyngeal- Thin Straw -- Pharyngeal -- Pharyngeal- Puree Pharyngeal residue - valleculae;Reduced tongue base retraction Pharyngeal Material does not enter airway Pharyngeal- Mechanical Soft NT Pharyngeal -- Pharyngeal- Regular -- Pharyngeal -- Pharyngeal- Multi-consistency -- Pharyngeal -- Pharyngeal- Pill -- Pharyngeal -- Pharyngeal Comment --  CHL IP CERVICAL ESOPHAGEAL PHASE 07/05/2016 Cervical Esophageal Phase WFL Pudding Teaspoon -- Pudding Cup -- Honey Teaspoon -- Honey Cup -- Nectar Teaspoon -- Nectar Cup -- Nectar Straw -- Thin Teaspoon -- Thin Cup -- Thin Straw -- Puree -- Mechanical Soft -- Regular -- Multi-consistency -- Pill -- Cervical Esophageal Comment -- CHL IP GO 06/16/2016 Functional Assessment Tool Used skilled clinical judgement Functional Limitations Swallowing Swallow Current Status (J0300) CK Swallow Goal Status (P2330) CJ Swallow Discharge Status (Q7622) (None) Motor Speech Current Status (Q3335) (None) Motor Speech Goal Status (K5625) (None) Motor Speech Goal Status (W3893) (None) Spoken Language Comprehension Current Status (T3428) (None) Spoken Language Comprehension Goal Status (J6811) (None) Spoken Language Comprehension Discharge Status (X7262) (None) Spoken Language Expression Current Status (M3559) (None) Spoken Language Expression Goal Status (R4163) (None) Spoken Language Expression Discharge Status [phone]) (None) Attention Current Status (I6803) (None) Attention Goal Status (O1224) (None) Attention Discharge Status (M2500) (None) Memory Current Status (B7048) (None) Memory Goal Status (G8916) (None) Memory Discharge Status (X4503) (None) Voice Current Status (U8828) (None) Voice Goal Status (M0349) (None) Voice Discharge Status (Z7915) (None) Other Speech-Language Pathology  Functional Limitation Current Status [phone]) (None) Other Speech-Language Pathology Functional Limitation Goal Status (S1683) (None) Other Speech-Language Pathology Functional Limitation Discharge Status [phone]) (None) [name] 07/05/2016, 3:18 PM  [name] [name].Ed CCC-SLP Pager [phone]                Lab Results  Component Value Date   HGBA1C 5.5 07/01/2016   Lab Results  Component Value Date   LDLCALC 68 07/01/2016   CREATININE 0.83 07/06/2016       Scheduled Meds: . enoxaparin (LOVENOX) injection  1 mg/kg Subcutaneous Q12H  . guaiFENesin  600 mg Oral BID  . pantoprazole  40 mg Oral BID  . potassium chloride  40 mEq Oral BID  . simvastatin  40 mg Oral QHS  . tamsulosin  0.4 mg Oral QHS   Continuous Infusions: . feeding supplement (JEVITY 1.2 CAL) 1,000 mL (07/04/16 0503)     LOS: 5 days    Time spent: >30 MINS    [facility]  [facility] Pager [phone]. If 7PM-7AM, please contact night-coverage at [email or web address], password [facility] 07/06/2016, 10:25 AM  LOS: 5 days

## 2016-07-06 NOTE — Progress Notes (Signed)
qPhysical Therapy Treatment Patient Details Name: Jason [name] MRN: [id number] DOB: 1927/10/13 Today's Date: 07/06/2016    History of Present Illness Jason [name] is a 83 y.o. male with medical history significant for Paroxysmal AFib on eliquis, GERD, Barrett's esophagus, bladder CA, non-Hodgkin's lymphoma, OA, hyperlipidemia who presented in ED on 06/30/16 with acute onset of slurred speech, right-sided facial droop, and right sided upper and lower extremity weakness. He had just been recently hospitalized from 3/14 -3/19 for community-acquired pneumonia. Upon discharge he was sent to a skilled nursing facility for rehabilitation. 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 No hemorrhage.    PT Comments    Pt is progressing toward PT goals and showed improvements since last session.  He required min guard and a chair to follow for ambulation and moderate assist with sit to stand.  He will benefit from continued rehab services acutely and at the venue listed below to improve functional mobility and overall strength.  Follow Up Recommendations  SNF;Supervision/Assistance - 24 hour     Equipment Recommendations  None recommended by PT    Recommendations for Other Services       Precautions / Restrictions Precautions Precautions: Fall Restrictions Weight Bearing Restrictions: No    Mobility  Bed Mobility               General bed mobility comments: OOB in chair on arrival.  Transfers Overall transfer level: Needs assistance Equipment used: Rolling walker (2 wheeled) Transfers: Sit to/from Stand Sit to Stand: Mod assist         General transfer comment: Mod physical assist required for power up.  Pt required VC's and extra time to scoot to edge of chair and shift weight forward before standing. Posterior lean with initial stand. Pt instructed to shift weight forward.  Ambulation/Gait Ambulation/Gait assistance: Min guard;+2  safety/equipment Ambulation Distance (Feet): 100 Feet x 2 Assistive device: Rolling walker (2 wheeled) Gait Pattern/deviations: Trunk flexed;Step-through pattern;Decreased stride length Gait velocity: decreased Gait velocity interpretation: Below normal speed for age/gender General Gait Details: R side slight knee flexion/dip with weight bearing.  Same on L but to a lesser extent.  Gait deviation worsened with fatigue.  Sitting break required halfway through ambulation. + 2 for chair follow and min guard for safety.   Stairs            Wheelchair Mobility    Modified Rankin (Stroke Patients Only) Modified Rankin (Stroke Patients Only) Pre-Morbid Rankin Score: Moderately severe disability Modified Rankin: Moderately severe disability     Balance Overall balance assessment: Needs assistance Sitting-balance support: Feet supported;No upper extremity supported Sitting balance-Leahy Scale: Fair     Standing balance support: Bilateral upper extremity supported Standing balance-Leahy Scale: Poor Standing balance comment: Reliant on RW.                            Cognition Arousal/Alertness: Awake/alert Behavior During Therapy: WFL for tasks assessed/performed Overall Cognitive Status: Impaired/Different from baseline Area of Impairment: Safety/judgement                       Following Commands: Follows one step commands with increased time Safety/Judgement: Decreased awareness of safety;Decreased awareness of deficits            Exercises      General Comments General comments (skin integrity, edema, etc.): Daughter present for session.      Pertinent  Vitals/Pain Pain Assessment: No/denies pain Pain Intervention(s): Monitored during session    Home Living                      Prior Function            PT Goals (current goals can now be found in the care plan section) Acute Rehab PT Goals Patient Stated Goal: To get better PT  Goal Formulation: With patient Time For Goal Achievement: 07/15/16 Potential to Achieve Goals: Good Progress towards PT goals: Progressing toward goals    Frequency    Min 3X/week      PT Plan Current plan remains appropriate    Co-evaluation             End of Session Equipment Utilized During Treatment: Gait belt Activity Tolerance: Patient tolerated treatment well Patient left: in chair;with call bell/phone within reach;with chair alarm set;with family/visitor present   PT Visit Diagnosis: Unsteadiness on feet (R26.81);Other abnormalities of gait and mobility (R26.89);Muscle weakness (generalized) (M62.81)     Time: 2440-1027 PT Time Calculation (min) (ACUTE ONLY): 25 min  Charges:                       G Codes:       Gaetano Net SPT   Gaetano Net 07/06/2016, 1:09 PM

## 2016-07-06 NOTE — Progress Notes (Signed)
Occupational Therapy Treatment Patient Details Name: HIGINIO GROW MRN: [id number] DOB: 1927-04-11 Today's Date: 07/06/2016    History of present illness BASSAM DRESCH is a 83 y.o. male with medical history significant for Paroxysmal AFib on eliquis, GERD, Barrett's esophagus, bladder CA, non-Hodgkin's lymphoma, OA, hyperlipidemia who presented in ED on 06/30/16 with acute onset of slurred speech, right-sided facial droop, and right sided upper and lower extremity weakness. He had just been recently hospitalized from 3/14 -3/19 for community-acquired pneumonia. Upon discharge he was sent to a skilled nursing facility for rehabilitation. 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 No hemorrhage.   OT comments  Pt progressing toward OT goals. He was able to ambulate to [facility] for toileting tasks with min assist +2 for functional mobility. He required mod assist +2 for initial sit<>stand from recliner and min assist +2 for sit<>stand from [facility] Able to stand at sink for grooming tasks with cues for sequencing and min guard assist. He was able to inconsistently follow multi-step commands this session. D/C plan remains appropriate. OT will continue to follow acutely.  Follow Up Recommendations  SNF    Equipment Recommendations  Other (comment) (TBD at next venue of care.)    Recommendations for Other Services      Precautions / Restrictions Precautions Precautions: Fall Restrictions Weight Bearing Restrictions: No       Mobility Bed Mobility               General bed mobility comments: OOB in chair on arrival.  Transfers Overall transfer level: Needs assistance Equipment used: Rolling walker (2 wheeled) Transfers: Sit to/from Stand Sit to Stand: Mod assist;Min assist;+2 physical assistance         General transfer comment: Mod assist +2 for sit<>stand from chair (including scooting forward). Min assist +2 for sit<>stand from [facility].    Balance  Overall balance assessment: Needs assistance Sitting-balance support: Feet supported;No upper extremity supported Sitting balance-Leahy Scale: Fair     Standing balance support: Bilateral upper extremity supported;Single extremity supported;During functional activity Standing balance-Leahy Scale: Poor Standing balance comment: VC's for sequencing. Reliant on RW for stability during functional mobility.                           ADL either performed or assessed with clinical judgement   ADL Overall ADL's : Needs assistance/impaired     Grooming: Wash/dry hands;Min guard;Standing;Cueing for safety;Cueing for sequencing Grooming Details (indicate cue type and reason): Reported feeling "not right" due to feeling weak after ambulatng to sink.                 Toilet Transfer: Moderate assistance;Minimal assistance;+2 for safety/equipment;+2 for physical assistance;Cueing for sequencing Toilet Transfer Details (indicate cue type and reason): VC's for sequencing. Sit<>stand from recliner with mod assist +2 but able to complete sit<>stand from [facility] with min assist +2 for safety. VC's for hand placement and to prevent standing impulsively. Toileting- Clothing Manipulation and Hygiene: Sit to/from stand;Minimal assistance       Functional mobility during ADLs: Minimal assistance;+2 for safety/equipment;Rolling walker General ADL Comments: Min +2 assist (for safety) during functional mobility from chair to [facility] and from [facility] to sink.     Vision   Additional Comments: Pt able to identify items on the L with VC's. However, demonstrated inattention to L side when using RW.   Perception     Praxis      Cognition  Arousal/Alertness: Awake/alert Behavior During Therapy: WFL for tasks assessed/performed Overall Cognitive Status: Impaired/Different from baseline Area of Impairment: Following commands;Safety/judgement;Awareness                   Current Attention Level:  Sustained   Following Commands: Follows one step commands with increased time;Follows multi-step commands inconsistently Safety/Judgement: Decreased awareness of safety;Decreased awareness of deficits Awareness: Emergent   General Comments: Pt able to follow one-step commands with increased time and multi-step commands inconsistently.        Exercises     Shoulder Instructions       General Comments      Pertinent Vitals/ Pain       Pain Assessment: No/denies pain  Home Living                                          Prior Functioning/Environment              Frequency  Min 3X/week        Progress Toward Goals  OT Goals(current goals can now be found in the care plan section)  Progress towards OT goals: Progressing toward goals  Acute Rehab OT Goals Patient Stated Goal: To get better OT Goal Formulation: With patient Time For Goal Achievement: 07/15/16 Potential to Achieve Goals: Good ADL Goals Pt Will Perform Grooming:  (with focus on grooming that requires B UE (ie oral care).) Pt Will Perform Upper Body Bathing: with supervision;sitting Pt Will Perform Lower Body Bathing: with supervision;with adaptive equipment;sit to/from stand Pt Will Transfer to Toilet: with supervision;ambulating (BSC over toilet with RW) Pt Will Perform Toileting - Clothing Manipulation and hygiene: with min guard assist;sit to/from stand  Plan Discharge plan needs to be updated    Co-evaluation                 End of Session Equipment Utilized During Treatment: Gait belt;Rolling walker  OT Visit Diagnosis: Unsteadiness on feet (R26.81);Muscle weakness (generalized) (M62.81);Other symptoms and signs involving the nervous system (R29.898);Other symptoms and signs involving cognitive function   Activity Tolerance Patient tolerated treatment well   Patient Left in chair;with chair alarm set;with family/visitor present   Nurse Communication Mobility  status        Time: 0902-0920 OT Time Calculation (min): 18 min  Charges: OT General Charges $OT Visit: 1 Procedure OT Treatments $Self Care/Home Management : 8-22 mins  Norman Herrlich, MS OTR/L  Pager: [place] [facility] [place] 07/06/2016, 9:36 AM

## 2016-07-07 ENCOUNTER — Ambulatory Visit: Payer: Medicare Other | Admitting: Neurology

## 2016-07-07 DIAGNOSIS — R2689 Other abnormalities of gait and mobility: Secondary | ICD-10-CM | POA: Diagnosis not present

## 2016-07-07 DIAGNOSIS — K22719 Barrett's esophagus with dysplasia, unspecified: Secondary | ICD-10-CM | POA: Diagnosis not present

## 2016-07-07 DIAGNOSIS — Z9181 History of falling: Secondary | ICD-10-CM | POA: Diagnosis not present

## 2016-07-07 DIAGNOSIS — I480 Paroxysmal atrial fibrillation: Secondary | ICD-10-CM | POA: Diagnosis not present

## 2016-07-07 DIAGNOSIS — G8190 Hemiplegia, unspecified affecting unspecified side: Secondary | ICD-10-CM | POA: Diagnosis not present

## 2016-07-07 DIAGNOSIS — M1990 Unspecified osteoarthritis, unspecified site: Secondary | ICD-10-CM | POA: Diagnosis not present

## 2016-07-07 DIAGNOSIS — H3530 Unspecified macular degeneration: Secondary | ICD-10-CM | POA: Diagnosis not present

## 2016-07-07 DIAGNOSIS — I69392 Facial weakness following cerebral infarction: Secondary | ICD-10-CM | POA: Diagnosis not present

## 2016-07-07 DIAGNOSIS — N400 Enlarged prostate without lower urinary tract symptoms: Secondary | ICD-10-CM | POA: Diagnosis not present

## 2016-07-07 DIAGNOSIS — J189 Pneumonia, unspecified organism: Secondary | ICD-10-CM | POA: Diagnosis not present

## 2016-07-07 DIAGNOSIS — I509 Heart failure, unspecified: Secondary | ICD-10-CM | POA: Diagnosis not present

## 2016-07-07 DIAGNOSIS — N183 Chronic kidney disease, stage 3 (moderate): Secondary | ICD-10-CM | POA: Diagnosis not present

## 2016-07-07 DIAGNOSIS — Z8551 Personal history of malignant neoplasm of bladder: Secondary | ICD-10-CM | POA: Diagnosis not present

## 2016-07-07 DIAGNOSIS — R278 Other lack of coordination: Secondary | ICD-10-CM | POA: Diagnosis not present

## 2016-07-07 DIAGNOSIS — Z7901 Long term (current) use of anticoagulants: Secondary | ICD-10-CM | POA: Diagnosis not present

## 2016-07-07 DIAGNOSIS — K5900 Constipation, unspecified: Secondary | ICD-10-CM | POA: Diagnosis not present

## 2016-07-07 DIAGNOSIS — K449 Diaphragmatic hernia without obstruction or gangrene: Secondary | ICD-10-CM | POA: Diagnosis not present

## 2016-07-07 DIAGNOSIS — I69351 Hemiplegia and hemiparesis following cerebral infarction affecting right dominant side: Secondary | ICD-10-CM | POA: Diagnosis not present

## 2016-07-07 DIAGNOSIS — H04129 Dry eye syndrome of unspecified lacrimal gland: Secondary | ICD-10-CM | POA: Diagnosis not present

## 2016-07-07 DIAGNOSIS — M6281 Muscle weakness (generalized): Secondary | ICD-10-CM | POA: Diagnosis not present

## 2016-07-07 DIAGNOSIS — G4700 Insomnia, unspecified: Secondary | ICD-10-CM | POA: Diagnosis not present

## 2016-07-07 DIAGNOSIS — R262 Difficulty in walking, not elsewhere classified: Secondary | ICD-10-CM | POA: Diagnosis not present

## 2016-07-07 DIAGNOSIS — E785 Hyperlipidemia, unspecified: Secondary | ICD-10-CM | POA: Diagnosis not present

## 2016-07-07 DIAGNOSIS — R1312 Dysphagia, oropharyngeal phase: Secondary | ICD-10-CM | POA: Diagnosis not present

## 2016-07-07 DIAGNOSIS — K219 Gastro-esophageal reflux disease without esophagitis: Secondary | ICD-10-CM | POA: Diagnosis not present

## 2016-07-07 DIAGNOSIS — D649 Anemia, unspecified: Secondary | ICD-10-CM | POA: Diagnosis not present

## 2016-07-07 DIAGNOSIS — I69322 Dysarthria following cerebral infarction: Secondary | ICD-10-CM | POA: Diagnosis not present

## 2016-07-07 DIAGNOSIS — R1310 Dysphagia, unspecified: Secondary | ICD-10-CM | POA: Diagnosis not present

## 2016-07-07 LAB — CBC
HEMATOCRIT: 36 % — AB (ref 39.0–52.0)
HEMOGLOBIN: 11.7 g/dL — AB (ref 13.0–17.0)
MCH: 27.5 pg (ref 26.0–34.0)
MCHC: 32.5 g/dL (ref 30.0–36.0)
MCV: 84.5 fL (ref 78.0–100.0)
PLATELETS: 154 10*3/uL (ref 150–400)
RBC: 4.26 MIL/uL (ref 4.22–5.81)
RDW: 14 % (ref 11.5–15.5)
WBC: 7 10*3/uL (ref 4.0–10.5)

## 2016-07-07 LAB — GLUCOSE, CAPILLARY
GLUCOSE-CAPILLARY: 86 mg/dL (ref 65–99)
GLUCOSE-CAPILLARY: 83 mg/dL (ref 65–99)
GLUCOSE-CAPILLARY: 111 mg/dL — AB (ref 65–99)
Glucose-Capillary: 87 mg/dL (ref 65–99)

## 2016-07-07 LAB — BASIC METABOLIC PANEL
Anion gap: 9 (ref 5–15)
BUN: 15 mg/dL (ref 6–20)
CHLORIDE: 107 mmol/L (ref 101–111)
CO2: 22 mmol/L (ref 22–32)
Calcium: 8.8 mg/dL — ABNORMAL LOW (ref 8.9–10.3)
Creatinine, Ser: 0.97 mg/dL (ref 0.61–1.24)
GFR calc Af Amer: >60 mL/min (ref >60)
GFR calc non Af Amer: >60 mL/min (ref >60)
GLUCOSE: 95 mg/dL (ref 65–99)
POTASSIUM: 3.9 mmol/L (ref 3.5–5.1)
SODIUM: 138 mmol/L (ref 135–145)

## 2016-07-07 NOTE — Care Management Note (Signed)
Case Management Note  Patient Details  Name: Jason [name] MRN: [id number] Date of Birth: 1927/10/25  Subjective/Objective:                    Action/Plan:  DC to SNF, will return to [facility] as facilitated by CSW.   Expected Discharge Date:  07/07/16               Expected Discharge Plan:  Skilled Nursing Facility  In-House Referral:  Clinical Social Work  Discharge planning Services  CM Consult  Post Acute Care Choice:    Choice offered to:     DME Arranged:    DME Agency:     HH Arranged:    [place] Agency:     Status of Service:  Completed, signed off  If discussed at [employer], dates discussed:    Additional Comments:  Carles Collet, RN 07/07/2016, 10:34 AM

## 2016-07-07 NOTE — Clinical Social Work Note (Signed)
CSW facilitated patient discharge including contacting patient family and facility to confirm patient discharge plans. Clinical information faxed to facility and family agreeable with plan. CSW arranged ambulance transport via [facility] to [facility] at 12:45 pm. RN to call report prior to discharge [phone] Room 52).  CSW will sign off for now as social work intervention is no longer needed. Please consult [place] again if new needs arise.  Dayton Scrape, [place]

## 2016-07-07 NOTE — Progress Notes (Signed)
Report given to Abigail Butts at [facility]. Family at bedside aware of disposition. All belongings sent with pt. Awaiting for [facility].  Ave Filter, RN

## 2016-07-07 NOTE — Discharge Summary (Addendum)
Physician Discharge Summary  SEBASTHIAN STAILEY MRN: [id number] DOB/AGE: [birth year] 81 y.o.  PCP: Melinda Crutch, MD   Admit date: 06/30/2016 Discharge date: 07/07/2016  Discharge Diagnoses:    Principal Problem:   Hemiplegia affecting dominant side ([place]) Active Problems:   Hyperlipidemia   GERD (gastroesophageal reflux disease)   BPH (benign prostatic hyperplasia)   PAF (paroxysmal atrial fibrillation) ([facility])   Dysphagia   CVA (cerebral vascular accident) ([place])   Dysarthria   History of cancer    Follow-up recommendations Follow-up with PCP in 3-5 days , including all  additional recommended appointments as below Follow-up CBC, CMP in 3-5 days   Speech Therapy recommendations  Diet recommendations: Honey-thick liquid;Dysphagia 1 (puree) Liquids provided via: Teaspoon Medication Administration: Crushed with puree Supervision: Patient able to self feed;Full supervision/cueing for compensatory strategies Compensations: Slow rate;Small sips/bites;Clear throat intermittently;Monitor for anterior loss;Minimize environmental distractions Postural Changes and/or Swallow Maneuvers: Seated upright 90 degrees;Upright 30-60 min after meal    Current Discharge Medication List    CONTINUE these medications which have NOT CHANGED   Details  apixaban (ELIQUIS) 5 MG TABS tablet Take 5 mg by mouth 2 (two) times daily.    esomeprazole (NEXIUM) 40 MG capsule TAKE 1 CAPSULE (40 MG TOTAL) BY MOUTH 2 (TWO) TIMES DAILY BEFORE A MEAL. Qty: 60 capsule, Refills: 6    loratadine (CLARITIN) 10 MG tablet Take 10 mg by mouth daily as needed for allergies.    Multiple Vitamins-Minerals (ICAPS MV PO) Take 1 capsule by mouth 2 (two) times daily.     Polyethyl Glycol-Propyl Glycol (SYSTANE) 0.4-0.3 % SOLN Place 1 drop into both eyes daily as needed (for dry eyes).     simvastatin (ZOCOR) 40 MG tablet Take 40 mg by mouth at bedtime.      tamsulosin (FLOMAX) 0.4 MG CAPS capsule Take 0.4 mg by mouth at  bedtime.    traZODone (DESYREL) 50 MG tablet Take 50 mg by mouth at bedtime as needed for sleep.    fish oil-omega-3 fatty acids 1000 MG capsule Take 1 g by mouth 2 (two) times daily.          Discharge Condition stable   Please note  You were cared for by a hospitalist during your hospital stay. Once you are discharged, your primary care physician will handle any further medical issues. Please note that NO REFILLS for any discharge medications will be authorized once you are discharged, as it is imperative that you return to your primary care physician (or establish a relationship with a primary care physician if you do not have one) for your aftercare needs so that they can reassess your need for medications and monitor your lab values.     No Known Allergies    Disposition: 03-Skilled Nursing Facility   Consults:  neurology   Significant Diagnostic Studies:  Ct Angio Head W Or Wo Contrast  Result Date: 06/30/2016 CLINICAL DATA:  New onset right-sided facial droop and intermittent dysarthria over the last 2 days. EXAM: CT ANGIOGRAPHY HEAD AND NECK TECHNIQUE: Multidetector CT imaging of the head and neck was performed using the standard protocol during bolus administration of intravenous contrast. Multiplanar CT image reconstructions and MIPs were obtained to evaluate the vascular anatomy. Carotid stenosis measurements (when applicable) are obtained utilizing NASCET criteria, using the distal internal carotid diameter as the denominator. CONTRAST:  50 mL Isovue 370 COMPARISON:  CT head without contrast from the same day. MRI brain 06/16/2016 FINDINGS: CTA NECK FINDINGS Aortic arch: A 3  vessel arch configuration is present. Atherosclerotic changes are present at the aortic arch without significant stenosis at the great vessel origins. There is no aneurysm. Right carotid system: Common carotid artery is tortuous proximally. There is no focal stenosis. Atherosclerotic calcifications  are present at the right carotid bifurcation without significant stenosis relative to the more distal vessel. Remainder of the right internal carotid artery is normal. Left carotid system: The left common carotid artery is tortuous proximally. There is no significant stenosis. Atherosclerotic changes are present at the carotid bifurcation without significant stenosis. The more distal left internal carotid artery is normal. Vertebral arteries: Both vertebral arteries originate from the subclavian arteries without significant stenoses. The vertebral arteries are codominant. There is no significant stenosis or vascular injury to either vertebral artery in the neck. Skeleton: Mild endplate degenerative and facet changes are present. No focal lytic or blastic lesions are present. There is exaggerated thoracic kyphosis and cervical lordosis. Other neck: No focal mucosal or submucosal lesions are present. Bilateral thyroid nodules are present without a dominant lesion. There is no significant adenopathy. The salivary glands are within normal limits. Upper chest: The lung apices demonstrate bilateral dependent atelectasis. No focal nodule or mass lesion is present. Review of the MIP images confirms the above findings CTA HEAD FINDINGS Anterior circulation: Atherosclerotic calcifications are present within the cavernous internal carotid arteries bilaterally without a significant stenosis through the ICA terminus. The A1 and M1 segments are normal. The anterior communicating artery is patent. The MCA bifurcations are intact. There is segmental attenuation of ACA and MCA branch vessels bilaterally without a significant proximal stenosis or occlusion. Posterior circulation: The vertebral arteries are codominant. Atherosclerotic changes are present at the dural margin. The PICA origins are visualized and normal. The basilar artery is normal. Both posterior cerebral arteries originate from the basilar tip. There is no significant  proximal stenosis or occlusion within either posterior cerebral artery. Venous sinuses: The dural sinuses are patent. The right transverse sinus is dominant. The straight sinus deep cerebral veins are intact. The cortical veins are unremarkable. Anatomic variants: None Delayed phase: Not performed. Arterial source images are unremarkable. Review of the MIP images confirms the above findings IMPRESSION: 1. No emerging large vessel occlusion. 2. Atherosclerotic changes at the aortic arch, carotid bifurcations, dural margin of vertebral arteries, and cavernous internal carotid arteries without significant stenoses. 3. Mild diffuse segmental branch vessel irregularity within both the anterior and posterior circulation suggesting intracranial atherosclerotic change. There is no significant proximal stenosis or occlusion. 4. Multilevel spondylosis of the cervical spine. Electronically Signed   By: San Morelle M.D.   On: 06/30/2016 17:03   Dg Chest 2 View  Result Date: 06/30/2016 CLINICAL DATA:  Cough, recent episode of pneumonia, stroke symptoms. History of bladder malignancy EXAM: CHEST  2 VIEW COMPARISON:  Chest x-ray of June 15, 2016 FINDINGS: The lungs are adequately inflated. The interstitial markings are coarse in the mid and lower lung zones but have improved in the upper lobes. The cardiac silhouette remains enlarged. The pulmonary vascularity is not engorged. There is no pleural effusion. There is calcification in the wall of the thoracic aorta. There is multilevel degenerative disc disease of the thoracic spine. IMPRESSION: There has been some improvement in the appearance of the lungs especially upper lobes but coarse interstitial markings persist in the lower lobes. There is no discrete pneumonia. There is stable cardiomegaly without pulmonary vascular congestion. Thoracic aortic atherosclerosis. Electronically Signed   By: David  Martinique M.D.  On: 06/30/2016 15:47   Dg Chest 2 View  Result  Date: 06/15/2016 CLINICAL DATA:  Dizziness and confusion EXAM: CHEST  2 VIEW COMPARISON:  Aug 09, 2013 FINDINGS: There is cardiomegaly with mild pulmonary venous hypertension. There is mild interstitial edema in the lung bases. There is focal airspace consolidation in the medial left base. Lungs elsewhere clear. No adenopathy. No bone lesions. IMPRESSION: Findings consistent with a degree of congestive heart failure. Suspect superimposed pneumonia medial left base. Followup PA and lateral chest radiographs recommended in 3-4 weeks following trial of antibiotic therapy to ensure resolution and exclude underlying malignancy. Electronically Signed   By: Lowella Grip III M.D.   On: 06/15/2016 07:58   Ct Head Wo Contrast  Result Date: 06/15/2016 CLINICAL DATA:  Lower extremity weakness.  Fall. EXAM: CT HEAD WITHOUT CONTRAST TECHNIQUE: Contiguous axial images were obtained from the base of the skull through the vertex without intravenous contrast. COMPARISON:  Head CT November 15, 2015 FINDINGS: Brain: Moderate diffuse atrophy is stable. There is no intracranial mass, hemorrhage, extra-axial fluid collection, or midline shift. There is mild patchy small vessel disease in the centra semiovale bilaterally. Elsewhere gray-white compartments appear normal. No acute infarct evident. Vascular: No hyperdense vessel. There are foci of calcification in each carotid siphon region as well as in each distal vertebral artery. Skull: The bony calvarium appears intact. Sinuses/Orbits: There is mucosal thickening and opacification in multiple ethmoid air cells. There is mucosal thickening in the anteromedial right sphenoid sinus. Visualized paranasal sinuses elsewhere are clear. Visualized orbits appear symmetric bilaterally with the exception of apparent cataract removal on the right. Other: Mastoid air cells are clear. IMPRESSION: Atrophy with relatively mild periventricular small vessel disease, stable. No intracranial mass,  hemorrhage, or extra-axial fluid collection. No acute appearing infarct. Areas of arterial vascular calcification noted. There is paranasal sinus disease, primarily in multiple ethmoid air cells. Electronically Signed   By: Lowella Grip III M.D.   On: 06/15/2016 08:10   Ct Angio Neck W Or Wo Contrast  Result Date: 06/30/2016 CLINICAL DATA:  New onset right-sided facial droop and intermittent dysarthria over the last 2 days. EXAM: CT ANGIOGRAPHY HEAD AND NECK TECHNIQUE: Multidetector CT imaging of the head and neck was performed using the standard protocol during bolus administration of intravenous contrast. Multiplanar CT image reconstructions and MIPs were obtained to evaluate the vascular anatomy. Carotid stenosis measurements (when applicable) are obtained utilizing NASCET criteria, using the distal internal carotid diameter as the denominator. CONTRAST:  50 mL Isovue 370 COMPARISON:  CT head without contrast from the same day. MRI brain 06/16/2016 FINDINGS: CTA NECK FINDINGS Aortic arch: A 3 vessel arch configuration is present. Atherosclerotic changes are present at the aortic arch without significant stenosis at the great vessel origins. There is no aneurysm. Right carotid system: Common carotid artery is tortuous proximally. There is no focal stenosis. Atherosclerotic calcifications are present at the right carotid bifurcation without significant stenosis relative to the more distal vessel. Remainder of the right internal carotid artery is normal. Left carotid system: The left common carotid artery is tortuous proximally. There is no significant stenosis. Atherosclerotic changes are present at the carotid bifurcation without significant stenosis. The more distal left internal carotid artery is normal. Vertebral arteries: Both vertebral arteries originate from the subclavian arteries without significant stenoses. The vertebral arteries are codominant. There is no significant stenosis or vascular  injury to either vertebral artery in the neck. Skeleton: Mild endplate degenerative and facet changes are present. No  focal lytic or blastic lesions are present. There is exaggerated thoracic kyphosis and cervical lordosis. Other neck: No focal mucosal or submucosal lesions are present. Bilateral thyroid nodules are present without a dominant lesion. There is no significant adenopathy. The salivary glands are within normal limits. Upper chest: The lung apices demonstrate bilateral dependent atelectasis. No focal nodule or mass lesion is present. Review of the MIP images confirms the above findings CTA HEAD FINDINGS Anterior circulation: Atherosclerotic calcifications are present within the cavernous internal carotid arteries bilaterally without a significant stenosis through the ICA terminus. The A1 and M1 segments are normal. The anterior communicating artery is patent. The MCA bifurcations are intact. There is segmental attenuation of ACA and MCA branch vessels bilaterally without a significant proximal stenosis or occlusion. Posterior circulation: The vertebral arteries are codominant. Atherosclerotic changes are present at the dural margin. The PICA origins are visualized and normal. The basilar artery is normal. Both posterior cerebral arteries originate from the basilar tip. There is no significant proximal stenosis or occlusion within either posterior cerebral artery. Venous sinuses: The dural sinuses are patent. The right transverse sinus is dominant. The straight sinus deep cerebral veins are intact. The cortical veins are unremarkable. Anatomic variants: None Delayed phase: Not performed. Arterial source images are unremarkable. Review of the MIP images confirms the above findings IMPRESSION: 1. No emerging large vessel occlusion. 2. Atherosclerotic changes at the aortic arch, carotid bifurcations, dural margin of vertebral arteries, and cavernous internal carotid arteries without significant stenoses. 3.  Mild diffuse segmental branch vessel irregularity within both the anterior and posterior circulation suggesting intracranial atherosclerotic change. There is no significant proximal stenosis or occlusion. 4. Multilevel spondylosis of the cervical spine. Electronically Signed   By: San Morelle M.D.   On: 06/30/2016 17:03   Ct Chest W Contrast  Result Date: 07/01/2016 CLINICAL DATA:  Bladder cancer, non-Hodgkin's lymphoma and Barrett's esophagus. EXAM: CT CHEST, ABDOMEN, AND PELVIS WITH CONTRAST TECHNIQUE: Multidetector CT imaging of the chest, abdomen and pelvis was performed following the standard protocol during bolus administration of intravenous contrast. CONTRAST:  1102m ISOVUE-300 IOPAMIDOL (ISOVUE-300) INJECTION 61% COMPARISON:  CXR from 06/30/2016, chest CT from 09/09/2011, CT chest, abdomen and pelvis from 09/09/2011 FINDINGS: CT CHEST FINDINGS Cardiovascular: Undo top normal size cardiac chambers without pericardial effusion. No large central pulmonary embolus. Aortic atherosclerosis without aneurysm. No dissection. Mild ectasia of the ascending aorta as before up to 3.9 cm. Normal branch pattern for the great vessels. Mediastinum/Nodes: Stable subcentimeter left-sided thyroid nodules since 2013 consistent with benign findings. The largest currently is approximately 12 mm. Fat containing right lower paratracheal lymph node measuring up to 13 mm is without significant change. Stable hilar lymph nodes, the largest on the right approximately 9 mm short axis and on the left, 8 mm short axis. Lungs/Pleura: Stable calcified and noncalcified subcentimeter pulmonary nodules dating back to 2013 consistent with benign findings, index nodule the left upper lobe measuring 7 mm, series 5, image 76. Bibasilar dependent atelectasis. There is lingular, right middle lobe and both lower lobe bronchiectasis with mild peribronchial thickening and chronic minimal parenchymal scarring and/or atelectasis.  Musculoskeletal: Lower thoracic kyphosis attributable to multilevel degenerative disc disease. Small osteophytes are noted along the anterior aspect of the mid and lower thoracic spine. No acute nor suspicious osseous abnormalities. CT ABDOMEN PELVIS FINDINGS Hepatobiliary: Status post cholecystectomy. No space-occupying mass of the liver. No biliary dilatation. Pancreas: Unremarkable. No pancreatic ductal dilatation or surrounding inflammatory changes. Spleen: Normal in size without focal abnormality.  Adrenals/Urinary Tract: Adrenal glands are unremarkable. Kidneys are normal, without renal calculi, focal lesion, or hydronephrosis. Bladder is unremarkable. Stomach/Bowel: Stomach is within normal limits. Appendix is surgically absent by report. No evidence of bowel wall thickening, distention, or inflammatory changes. Vascular/Lymphatic: Bandlike mesenteric soft tissue attenuation with calcification in the mid abdomen is slightly more prominent than on prior consistent with treated lymphoma. Small scattered mesenteric lymph nodes are seen in the right lower quadrant and root of the mesentery as before. No inguinal or pelvic sidewall adenopathy. No retroperitoneal or mesenteric lymphadenopathy. No porta hepatic nor epigastric enlargement of lymph nodes Reproductive: Prostate is unremarkable. Other: No abdominal wall hernia or abnormality. No abdominopelvic ascites. Musculoskeletal: No acute or significant osseous findings. Degenerative disc disease L4-5. IMPRESSION: 1. Chronic since 2013 are stable pulmonary nodules noted bilaterally, index nodule the left upper lobe measures 7 mm consistent with benign findings. 2. Hazy mesenteric fat in the mid abdomen with calcification consistent with treated non-Hodgkin's lymphoma. No recurrence of lymphadenopathy identified within chest, abdomen or pelvis. 3. Cholecystectomy. Electronically Signed   By: Ashley Royalty M.D.   On: 07/01/2016 20:12   Mr Brain Wo Contrast  Result  Date: 07/01/2016 CLINICAL DATA:  Acute presentation with slurred speech and right-sided facial droop. Right upper and lower extremity weakness. EXAM: MRI HEAD WITHOUT CONTRAST TECHNIQUE: Multiplanar, multiecho pulse sequences of the brain and surrounding structures were obtained without intravenous contrast. COMPARISON:  Multiple CT studies done yesterday.  MRI 06/16/2016. FINDINGS: Brain: There is acute infarction affecting the left para median pons. No acute infarction affecting the cerebral hemispheres. No focal cerebellar insult. Few old small vessel infarctions within the thalami I. Moderate chronic small-vessel ischemic changes of the cerebral hemispheric white matter. No cortical or large vessel territory infarction. No mass lesion, hemorrhage, hydrocephalus or extra-axial collection. Vascular: Major vessels at the base of the brain show flow. Skull and upper cervical spine: Negative Sinuses/Orbits: Clear except for mild maxillary sinus mucosal thickening and retention cyst. Orbits negative. Other: None significant IMPRESSION: Acute infarction affecting the left para median pons. No hemorrhage. Chronic small-vessel ischemic changes elsewhere as outlined above. Electronically Signed   By: Nelson Chimes M.D.   On: 07/01/2016 07:11   Mr Brain Wo Contrast  Result Date: 06/16/2016 CLINICAL DATA:  83 y/o  M; generalized weakness and fall. EXAM: MRI HEAD WITHOUT CONTRAST TECHNIQUE: Multiplanar, multiecho pulse sequences of the brain and surrounding structures were obtained without intravenous contrast. COMPARISON:  06/15/2016 CT of the head. FINDINGS: Brain: No acute infarction, hemorrhage, hydrocephalus, extra-axial collection or mass lesion. Foci of T2 FLAIR hyperintense signal abnormality in subcortical and periventricular white matter are nonspecific are compatible with moderate chronic microvascular ischemic changes of the brain. Mild brain parenchymal volume loss for age. Small chronic lacunar infarct  within the left caudate head and hemosiderin stained chronic lacunar infarct in right lentiform nucleus. Vascular: Normal flow voids. Skull and upper cervical spine: Normal marrow signal. Sinuses/Orbits: Mild ethmoid and maxillary sinus mucosal thickening and large right maxillary sinus mucous retention cyst. No abnormal signal of the mastoid air cells. Right intra-ocular lens replacement. Other: None. IMPRESSION: 1. No acute intracranial abnormality. 2. Moderate chronic microvascular ischemic changes and mild parenchymal volume loss of the brain. Small chronic lacunar infarcts within right lentiform nucleus and left caudate head. 3. Mild paranasal sinus disease. Electronically Signed   By: Kristine Garbe M.D.   On: 06/16/2016 19:20   Mr Cervical Spine Wo Contrast  Result Date: 06/17/2016 CLINICAL DATA:  Progressive  weakness.  Drooling. EXAM: MRI CERVICAL SPINE WITHOUT CONTRAST TECHNIQUE: Multiplanar, multisequence MR imaging of the cervical spine was performed. No intravenous contrast was administered. COMPARISON:  None. FINDINGS: Alignment: Exaggerated lordosis.  No listhesis. Vertebrae: No fracture, evidence of discitis, or bone lesion. Mild marrow edema about the left C4-5 facet, degenerative appearing. Cord: Normal signal and morphology. Posterior Fossa, vertebral arteries, paraspinal tissues: Bilateral thyroid nodules, up to 12 mm on the left, below size threshold for recommend sonographic follow-up. Disc levels: C2-3: Facet arthropathy with moderate spurring on the left. Ligamentum flavum thickening. Patent canal and foramina C3-4: Facet arthropathy with spurring greater on the left. Ligamentum flavum thickening. Mild endplate ridging. Patent canal and foramina C4-5: Facet arthropathy with moderate bilateral spurring. Ligamentum flavum thickening. No impingement C5-6: Facet arthropathy with mild spurring. Ligamentum flavum thickening. Disc narrowing with posterior disc osteophyte complex. No  impingement C6-7: Mild facet spurring.  Mild disc narrowing.  No impingement C7-T1:Mild facet arthropathy with possible ankylosis on the right. Mild disc narrowing. No impingement IMPRESSION: 1. No explanation for weakness. The canal and foramina are diffusely patent. No evidence of myelopathy. 2. Facet arthropathy and mild for age disc degeneration. Mild degenerative marrow edema noted about the left C4-5 facet. Electronically Signed   By: Monte Fantasia M.D.   On: 06/17/2016 12:41   Ct Abdomen Pelvis W Contrast  Result Date: 07/01/2016 CLINICAL DATA:  Bladder cancer, non-Hodgkin's lymphoma and Barrett's esophagus. EXAM: CT CHEST, ABDOMEN, AND PELVIS WITH CONTRAST TECHNIQUE: Multidetector CT imaging of the chest, abdomen and pelvis was performed following the standard protocol during bolus administration of intravenous contrast. CONTRAST:  171m ISOVUE-300 IOPAMIDOL (ISOVUE-300) INJECTION 61% COMPARISON:  CXR from 06/30/2016, chest CT from 09/09/2011, CT chest, abdomen and pelvis from 09/09/2011 FINDINGS: CT CHEST FINDINGS Cardiovascular: Undo top normal size cardiac chambers without pericardial effusion. No large central pulmonary embolus. Aortic atherosclerosis without aneurysm. No dissection. Mild ectasia of the ascending aorta as before up to 3.9 cm. Normal branch pattern for the great vessels. Mediastinum/Nodes: Stable subcentimeter left-sided thyroid nodules since 2013 consistent with benign findings. The largest currently is approximately 12 mm. Fat containing right lower paratracheal lymph node measuring up to 13 mm is without significant change. Stable hilar lymph nodes, the largest on the right approximately 9 mm short axis and on the left, 8 mm short axis. Lungs/Pleura: Stable calcified and noncalcified subcentimeter pulmonary nodules dating back to 2013 consistent with benign findings, index nodule the left upper lobe measuring 7 mm, series 5, image 76. Bibasilar dependent atelectasis. There is  lingular, right middle lobe and both lower lobe bronchiectasis with mild peribronchial thickening and chronic minimal parenchymal scarring and/or atelectasis. Musculoskeletal: Lower thoracic kyphosis attributable to multilevel degenerative disc disease. Small osteophytes are noted along the anterior aspect of the mid and lower thoracic spine. No acute nor suspicious osseous abnormalities. CT ABDOMEN PELVIS FINDINGS Hepatobiliary: Status post cholecystectomy. No space-occupying mass of the liver. No biliary dilatation. Pancreas: Unremarkable. No pancreatic ductal dilatation or surrounding inflammatory changes. Spleen: Normal in size without focal abnormality. Adrenals/Urinary Tract: Adrenal glands are unremarkable. Kidneys are normal, without renal calculi, focal lesion, or hydronephrosis. Bladder is unremarkable. Stomach/Bowel: Stomach is within normal limits. Appendix is surgically absent by report. No evidence of bowel wall thickening, distention, or inflammatory changes. Vascular/Lymphatic: Bandlike mesenteric soft tissue attenuation with calcification in the mid abdomen is slightly more prominent than on prior consistent with treated lymphoma. Small scattered mesenteric lymph nodes are seen in the right lower quadrant and root of  the mesentery as before. No inguinal or pelvic sidewall adenopathy. No retroperitoneal or mesenteric lymphadenopathy. No porta hepatic nor epigastric enlargement of lymph nodes Reproductive: Prostate is unremarkable. Other: No abdominal wall hernia or abnormality. No abdominopelvic ascites. Musculoskeletal: No acute or significant osseous findings. Degenerative disc disease L4-5. IMPRESSION: 1. Chronic since 2013 are stable pulmonary nodules noted bilaterally, index nodule the left upper lobe measures 7 mm consistent with benign findings. 2. Hazy mesenteric fat in the mid abdomen with calcification consistent with treated non-Hodgkin's lymphoma. No recurrence of lymphadenopathy  identified within chest, abdomen or pelvis. 3. Cholecystectomy. Electronically Signed   By: Ashley Royalty M.D.   On: 07/01/2016 20:12   Ct Cerebral Perfusion W Contrast  Result Date: 06/30/2016 CLINICAL DATA:  Dysarthria.  Right-sided facial droop. EXAM: CT PERFUSION BRAIN TECHNIQUE: Multiphase CT imaging of the brain was performed following IV bolus contrast injection. Subsequent parametric perfusion maps were calculated using RAPID software. CONTRAST:  40 mL Isovue 370 COMPARISON:  None. FINDINGS: CT Brain Perfusion Findings: CBF (<30%) Volume: 55m Perfusion (Tmax>6.0s) volume: 013mMismatch Volume: 4m23mnfarction Location:None No significant patient motion is present. Arterial input and venous output functions are excellent. IMPRESSION: Negative CT perfusion exam of the head. No evidence for acute infarct or ischemia. Electronically Signed   By: ChrSan MorelleD.   On: 06/30/2016 16:54   Dg Abd Portable 1v  Result Date: 07/02/2016 CLINICAL DATA:  Feeding tube placement. EXAM: PORTABLE ABDOMEN - 1 VIEW COMPARISON:  Abdomen and pelvis CT dated 07/01/2016. FINDINGS: Feeding tube tip in the mid stomach. Normal bowel gas pattern. Oral contrast in the right colon. Cholecystectomy clips. Thoracolumbar spine degenerative changes. IMPRESSION: Feeding tube tip in the mid stomach. Electronically Signed   By: SteClaudie ReveringD.   On: 07/02/2016 15:57   Dg Swallowing Func-speech Pathology  Result Date: 07/05/2016 term5 Objective Swallowing Evaluation: Type of Study: MBS-Modified Barium Swallow Study Patient Details Name: Jason [name]: [id number] of Birth: 02/1928-04-26day's Date: 07/05/2016 Time: SLP Start Time (ACUTE ONLY): 1300-SLP Stop Time (ACUTE ONLY): 1322 SLP Time Calculation (min) (ACUTE ONLY): 22 min Past Medical History: Past Medical History: Diagnosis Date . Arthritis  . Barrett esophagus  . Bladder cancer (HCCA[place]villex'd [id number] only . Colon polyps   adenomatous . Difficulty sleeping    OCCASIONALLY . Diverticulosis of colon (without mention of hemorrhage)  . Frequency of urination  . GERD (gastroesophageal reflux disease)  . Hiatal hernia  . History of bladder cancer 1981  EXCISION ONLY . History of skin cancer   BASAL CELL . Hyperlipidemia  . Lymphoma, [place]Hodgkin's [place]  [place] Macular degeneration   LEFT EYE . Multiple nodules of lung 09/16/2011 . [place] ([place]. Personal history of colonic polyps 07/15/2010  tubular adenoma . [facility] . PUD (peptic ulcer disease)  . [place] Past Surgical History: Past Surgical History: Procedure Laterality Date . APPENDECTOMY   . bone spur    left shoulder . CHOLECYSTECTOMY   . KNEE ARTHROPLASTY Left 07/30/2015  Procedure: LEFT TOTAL KNEE ARTHROPLASTY WITH COMPUTER NAVIGATION;  Surgeon: BriRod CanD;  Location: [facility];  Service: Orthopedics;  Laterality: Left; . LASER OF PROSTATE W/ GREEN LIGHT PVP   . TENDON REPAIR    right arm . TRIGGER FINGER RELEASE    bilateral hands . WRIST SURGERY    Bilateral HPI: Ptis an [age]o.  malewith PMH significant of PAF, Barrett's esophagus, bladder cancer, non-Hodgkin's lymphoma, OA, andHLD; who presents with acute onset of slurred speech, right-sided facial droop, and right sided upper and lower extremity weakness. He had just been recently hospitalized from 3/14 -3/19 for community-acquired pneumonia. Due to swallowing difficulties patient was evaluated by speech therapy and seen to be silently aspirating. Patient was placed on modified diet (recommended Dys 3 solids, thin liquids). Upon discharge he was sent to a skilled nursing facilityfor rehabilitation. 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 No hemorrhage. Chronic small-vessel ischemic changes elsewhere. CXR showed some improvement in the appearance of the lungs especially upper lobes but coarse interstitial markings  persist in the lower lobes. There is no discrete pneumonia. There is stablecardiomegaly without pulmonary vascular congestion.Thoracic aortic atherosclerosis. No Data Recorded Assessment / Plan / Recommendation CHL IP CLINICAL IMPRESSIONS 07/05/2016 Clinical Impression Mr. Rawles presents for a repeat MBS (prior MBS was 07/01/2016) and demonstrates improvements in his swallowing function. He exhibits a moderate motor-based pharyngeal dysphagia characterized by moderate vallecular residue due to reduced tongue base retraction. Reduced laryngeal elevation resulted in silent penetration to the level of the vocal cords with nectar thick liquids during the swallow and large sips of honey thick liquids after the swallow. Cued coughs and second swallows were intermittently effective in clearing penetrates, while the utilization of the suction was effective in clearing penetrates. A moderate amount of residue was still observed during this study, however it is improved from prior the MBS. MBS does not diagnose below the level of the UES however esophageal scan revealed what appears to be mild stasis in mid esophagus. Educated pt and daughter re: continued risk for aspiration, diet recommendations and reiterated importance/compliance for safe swallow precautions/compensatory strategies (second swallows, throat clear, use suction, upright posture and remain upright for minimum 45 min). Recommend Dys 1 (puree) solids, honey thick liquids (TEASPOON SIPS), meds crushed. ST will continue to f/u for dysphagia treatment and pharyngeal strengthening exercises.  SLP Visit Diagnosis Dysphagia, pharyngoesophageal phase (R13.14) Attention and concentration deficit following -- Frontal lobe and executive function deficit following -- Impact on safety and function Moderate aspiration risk   CHL IP TREATMENT RECOMMENDATION 07/05/2016 Treatment Recommendations Therapy as outlined in treatment plan below   Prognosis 07/05/2016 Prognosis for Safe  Diet Advancement Good Barriers to Reach Goals Severity of deficits Barriers/Prognosis Comment -- CHL IP DIET RECOMMENDATION 07/05/2016 SLP Diet Recommendations Dysphagia 1 (Puree) solids;Honey thick liquids;Other (Comment) Liquid Administration via Spoon Medication Administration Crushed with puree Compensations Slow rate;Small sips/bites;Clear throat after each swallow;Multiple dry swallows after each bite/sip Postural Changes Seated upright at 90 degrees   CHL IP OTHER RECOMMENDATIONS 07/05/2016 Recommended Consults -- Oral Care Recommendations Oral care BID Other Recommendations Order thickener from pharmacy;Have oral suction available   CHL IP FOLLOW UP RECOMMENDATIONS 07/05/2016 Follow up Recommendations Skilled Nursing facility   [facility] IP FREQUENCY AND DURATION 07/05/2016 Speech Therapy Frequency (ACUTE ONLY) min 2x/week Treatment Duration 2 weeks      CHL IP ORAL PHASE 07/05/2016 Oral Phase WFL Oral - Pudding Teaspoon -- Oral - Pudding Cup -- Oral - Honey Teaspoon -- Oral - Honey Cup WFL Oral - Nectar Teaspoon -- Oral - Nectar Cup WFL Oral - Nectar Straw -- Oral - Thin Teaspoon -- Oral - Thin Cup NT Oral - Thin Straw -- Oral - Puree WFL Oral - Mech Soft NT Oral - Regular -- Oral - Multi-Consistency -- Oral - Pill -- Oral Phase -  Comment --  CHL IP PHARYNGEAL PHASE 07/05/2016 Pharyngeal Phase Impaired Pharyngeal- Pudding Teaspoon -- Pharyngeal -- Pharyngeal- Pudding Cup -- Pharyngeal -- Pharyngeal- Honey Teaspoon -- Pharyngeal -- Pharyngeal- Honey Cup Pharyngeal residue - valleculae;Reduced tongue base retraction;Penetration/Apiration after swallow;Reduced laryngeal elevation Pharyngeal Other (Comment);Material enters airway, CONTACTS cords and then ejected out Pharyngeal- Nectar Teaspoon -- Pharyngeal -- Pharyngeal- Nectar Cup Penetration/Aspiration during swallow;Reduced laryngeal elevation;Pharyngeal residue - valleculae;Reduced tongue base retraction Pharyngeal Material enters airway, remains ABOVE vocal cords then  ejected out;Material enters airway, CONTACTS cords and not ejected out Pharyngeal- Nectar Straw -- Pharyngeal -- Pharyngeal- Thin Teaspoon -- Pharyngeal -- Pharyngeal- Thin Cup NT Pharyngeal -- Pharyngeal- Thin Straw -- Pharyngeal -- Pharyngeal- Puree Pharyngeal residue - valleculae;Reduced tongue base retraction Pharyngeal Material does not enter airway Pharyngeal- Mechanical Soft NT Pharyngeal -- Pharyngeal- Regular -- Pharyngeal -- Pharyngeal- Multi-consistency -- Pharyngeal -- Pharyngeal- Pill -- Pharyngeal -- Pharyngeal Comment --  CHL IP CERVICAL ESOPHAGEAL PHASE 07/05/2016 Cervical Esophageal Phase WFL Pudding Teaspoon -- Pudding Cup -- Honey Teaspoon -- Honey Cup -- Nectar Teaspoon -- Nectar Cup -- Nectar Straw -- Thin Teaspoon -- Thin Cup -- Thin Straw -- Puree -- Mechanical Soft -- Regular -- Multi-consistency -- Pill -- Cervical Esophageal Comment -- CHL IP GO 06/16/2016 Functional Assessment Tool Used skilled clinical judgement Functional Limitations Swallowing Swallow Current Status (L2751) CK Swallow Goal Status (Z0017) CJ Swallow Discharge Status (C9449) (None) Motor Speech Current Status (Q7591) (None) Motor Speech Goal Status (M3846) (None) Motor Speech Goal Status (K5993) (None) Spoken Language Comprehension Current Status (T7017) (None) Spoken Language Comprehension Goal Status (B9390) (None) Spoken Language Comprehension Discharge Status (Z0092) (None) Spoken Language Expression Current Status (Z3007) (None) Spoken Language Expression Goal Status (M2263) (None) Spoken Language Expression Discharge Status [phone]) (None) Attention Current Status (G2563) (None) Attention Goal Status (S9373) (None) Attention Discharge Status (S2876) (None) Memory Current Status (O1157) (None) Memory Goal Status (W6203) (None) Memory Discharge Status (T5974) (None) Voice Current Status (B6384) (None) Voice Goal Status (T3646) (None) Voice Discharge Status (O0321) (None) Other Speech-Language Pathology Functional  Limitation Current Status (Y2482) (None) Other Speech-Language Pathology Functional Limitation Goal Status (N0037) (None) Other Speech-Language Pathology Functional Limitation Discharge Status [phone]) (None) [name] 07/05/2016, 3:18 PM  [name] [name].Ed CCC-SLP Pager [phone]             Dg Swallowing Func-speech Pathology  Result Date: 07/01/2016 Objective Swallowing Evaluation: Type of Study: MBS-Modified Barium Swallow Study Patient Details Name: Jason [name] MRN: [id number] Date of Birth: 05-23-1927 Today's Date: 07/01/2016 Time: SLP Start Time (ACUTE ONLY): 0910-SLP Stop Time (ACUTE ONLY): 0933 SLP Time Calculation (min) (ACUTE ONLY): 23 min Past Medical History: Past Medical History: Diagnosis Date . Arthritis  . Barrett esophagus  . Bladder cancer ([place]) dx'd 4917,9150  surg only . Colon polyps   adenomatous . Difficulty sleeping   OCCASIONALLY . Diverticulosis of colon (without mention of hemorrhage)  . Frequency of urination  . GERD (gastroesophageal reflux disease)  . Hiatal hernia  . History of bladder cancer 1981  EXCISION ONLY . History of skin cancer   BASAL CELL . Hyperlipidemia  . Lymphoma, non-Hodgkin's ([place]) 2011  [place]  . Macular degeneration   LEFT EYE . Multiple nodules of lung 09/16/2011 . PAF (paroxysmal atrial fibrillation) ([place])  . Personal history of colonic polyps 07/15/2010  tubular adenoma . Prostate cancer [facility])   patient denies . PUD (peptic ulcer disease)  . Stomach cancer ([place])   NHL origin Past Surgical History:  Past Surgical History: Procedure Laterality Date . APPENDECTOMY   . bone spur    left shoulder . CHOLECYSTECTOMY   . KNEE ARTHROPLASTY Left 07/30/2015  Procedure: LEFT TOTAL KNEE ARTHROPLASTY WITH COMPUTER NAVIGATION;  Surgeon: Rod Can, MD;  Location: [facility];  Service: Orthopedics;  Laterality: Left; . LASER OF PROSTATE W/ GREEN LIGHT PVP   . TENDON REPAIR    right arm . TRIGGER FINGER RELEASE    bilateral hands . WRIST SURGERY    Bilateral  HPI: Ptis an 83 y.o. malewith PMH significant of PAF, Barrett's esophagus, bladder cancer, non-Hodgkin's lymphoma, OA, andHLD; who presents with acute onset of slurred speech, right-sided facial droop, and right sided upper and lower extremity weakness. He had just been recently hospitalized from 3/14 -3/19 for community-acquired pneumonia. Due to swallowing difficulties patient was evaluated by speech therapy and seen to be silently aspirating. Patient was placed on modified diet (recommended Dys 3 solids, thin liquids). Upon discharge he was sent to a skilled nursing facilityfor rehabilitation. 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 No hemorrhage. Chronic small-vessel ischemic changes elsewhere. CXR showed some improvement in the appearance of the lungs especially upper lobes but coarse interstitial markings persist in the lower lobes. There is no discrete pneumonia. There is stablecardiomegaly without pulmonary vascular congestion.Thoracic aortic atherosclerosis. No Data Recorded Assessment / Plan / Recommendation CHL IP CLINICAL IMPRESSIONS 07/01/2016 Clinical Impression Pt has had a decline in swallow function this admission following acute pontine CVA. Impairments primarily motor based with decreased laryngeal elevation, laryngeal closure and reduced tongue base retraction. Silent penetration and aspiration during initial and multiple swallows with nectar and honey thick consistencies. Chin tuck was ineffective to mitigate laryngeal invasion or to decrease vallecular residue during swallow. Pt required multiple verbal prompts for cough/throat clear which were weak and unable to clear vestibule. Although puree was not penetrated or aspirated, volume was significant and remained increasing risk. MBS does not diagnose below the level of the UES, however esophageal scan revealed stasis in distal esophagus. Recommend NPO status, oral care, short term alternative  nutrition and continue ST intervention. From ST/swallow standpoint, pt is an excellent candidate for inpatient rehab.   SLP Visit Diagnosis Dysphagia, oropharyngeal phase (R13.12) Attention and concentration deficit following -- Frontal lobe and executive function deficit following -- Impact on safety and function --   CHL IP TREATMENT RECOMMENDATION 07/01/2016 Treatment Recommendations Therapy as outlined in treatment plan below   Prognosis 07/01/2016 Prognosis for Safe Diet Advancement Good Barriers to Reach Goals -- Barriers/Prognosis Comment -- CHL IP DIET RECOMMENDATION 07/01/2016 SLP Diet Recommendations NPO Liquid Administration via -- Medication Administration Via alternative means Compensations -- Postural Changes --   CHL IP OTHER RECOMMENDATIONS 07/01/2016 Recommended Consults -- Oral Care Recommendations Oral care QID Other Recommendations --   CHL IP FOLLOW UP RECOMMENDATIONS 07/01/2016 Follow up Recommendations Inpatient Rehab   CHL IP FREQUENCY AND DURATION 07/01/2016 Speech Therapy Frequency (ACUTE ONLY) min 2x/week Treatment Duration 2 weeks      CHL IP ORAL PHASE 07/01/2016 Oral Phase Impaired Oral - Pudding Teaspoon -- Oral - Pudding Cup -- Oral - Honey Teaspoon -- Oral - Honey Cup Lingual/palatal residue Oral - Nectar Teaspoon -- Oral - Nectar Cup Lingual/palatal residue Oral - Nectar Straw -- Oral - Thin Teaspoon -- Oral - Thin Cup Lingual/palatal residue Oral - Thin Straw -- Oral - Puree Lingual/palatal residue Oral - Mech Soft NT Oral - Regular -- Oral - Multi-Consistency -- Oral -  Pill -- Oral Phase - Comment --  CHL IP PHARYNGEAL PHASE 07/01/2016 Pharyngeal Phase Impaired Pharyngeal- Pudding Teaspoon -- Pharyngeal -- Pharyngeal- Pudding Cup -- Pharyngeal -- Pharyngeal- Honey Teaspoon -- Pharyngeal -- Pharyngeal- Honey Cup Pharyngeal residue - valleculae;Reduced tongue base retraction;Penetration/Aspiration during swallow;Reduced laryngeal elevation;Reduced airway/laryngeal closure Pharyngeal  Material enters airway, passes BELOW cords without attempt by patient to eject out (silent aspiration);Material enters airway, CONTACTS cords and not ejected out Pharyngeal- Nectar Teaspoon -- Pharyngeal -- Pharyngeal- Nectar Cup Delayed swallow initiation-vallecula;Pharyngeal residue - valleculae;Reduced tongue base retraction;Reduced laryngeal elevation;Penetration/Aspiration during swallow;Reduced airway/laryngeal closure;Pharyngeal residue - pyriform Pharyngeal Material enters airway, passes BELOW cords without attempt by patient to eject out (silent aspiration);Material enters airway, CONTACTS cords and not ejected out Pharyngeal- Nectar Straw -- Pharyngeal -- Pharyngeal- Thin Teaspoon -- Pharyngeal -- Pharyngeal- Thin Cup NT Pharyngeal -- Pharyngeal- Thin Straw -- Pharyngeal -- Pharyngeal- Puree Pharyngeal residue - valleculae;Reduced tongue base retraction Pharyngeal -- Pharyngeal- Mechanical Soft NT Pharyngeal -- Pharyngeal- Regular -- Pharyngeal -- Pharyngeal- Multi-consistency -- Pharyngeal -- Pharyngeal- Pill -- Pharyngeal -- Pharyngeal Comment --  CHL IP CERVICAL ESOPHAGEAL PHASE 07/01/2016 Cervical Esophageal Phase WFL Pudding Teaspoon -- Pudding Cup -- Honey Teaspoon -- Honey Cup -- Nectar Teaspoon -- Nectar Cup -- Nectar Straw -- Thin Teaspoon -- Thin Cup -- Thin Straw -- Puree -- Mechanical Soft -- Regular -- Multi-consistency -- Pill -- Cervical Esophageal Comment -- CHL IP GO 06/16/2016 Functional Assessment Tool Used skilled clinical judgement Functional Limitations Swallowing Swallow Current Status (C5852) CK Swallow Goal Status (D7824) CJ Swallow Discharge Status (M3536) (None) Motor Speech Current Status (R4431) (None) Motor Speech Goal Status (V4008) (None) Motor Speech Goal Status (Q7619) (None) Spoken Language Comprehension Current Status (J0932) (None) Spoken Language Comprehension Goal Status (I7124) (None) Spoken Language Comprehension Discharge Status (P8099) (None) Spoken Language  Expression Current Status (I3382) (None) Spoken Language Expression Goal Status (N0539) (None) Spoken Language Expression Discharge Status [phone]) (None) Attention Current Status (L9379) (None) Attention Goal Status (K2409) (None) Attention Discharge Status (B3532) (None) Memory Current Status (D9242) (None) Memory Goal Status (A8341) (None) Memory Discharge Status (D6222) (None) Voice Current Status (L7989) (None) Voice Goal Status (Q1194) (None) Voice Discharge Status (R7408) (None) Other Speech-Language Pathology Functional Limitation Current Status (X4481) (None) Other Speech-Language Pathology Functional Limitation Goal Status (E5631) (None) Other Speech-Language Pathology Functional Limitation Discharge Status [phone]) (None) [name] 07/01/2016, 11:26 AM [name] [name].Ed CCC-SLP Pager [phone]              Dg Swallowing Func-speech Pathology  Result Date: 06/17/2016 Objective Swallowing Evaluation: Type of Study: MBS-Modified Barium Swallow Study Patient Details Name: Jason [name] MRN: [id number] Date of Birth: [birth year] Today's Date: 06/17/2016 Time: SLP Start Time (ACUTE ONLY): 1230-SLP Stop Time (ACUTE ONLY): 1300 SLP Time Calculation (min) (ACUTE ONLY): 30 min Past Medical History: Past Medical History: Diagnosis Date . Arthritis  . Barrett esophagus  . Bladder cancer ([place]) dx'd 7867,6720  surg only . Colon polyps   adenomatous . Difficulty sleeping   OCCASIONALLY . Diverticulosis of colon (without mention of hemorrhage)  . Frequency of urination  . GERD (gastroesophageal reflux disease)  . Hiatal hernia  . History of bladder cancer 1981  EXCISION ONLY . History of skin cancer   BASAL CELL . Hyperlipidemia  . Lymphoma, non-Hodgkin's ([place]) 2011  [place]  . Macular degeneration   LEFT EYE . Multiple nodules of lung 09/16/2011 . PAF (paroxysmal atrial fibrillation) ([place])  . Personal history of colonic polyps 07/15/2010  tubular adenoma . Prostate cancer [facility])   patient denies . PUD  (peptic ulcer disease)  . Stomach cancer ([place])   NHL origin Past Surgical History: Past Surgical History: Procedure Laterality Date . APPENDECTOMY   . bone spur    left shoulder . CHOLECYSTECTOMY   . KNEE ARTHROPLASTY Left 07/30/2015  Procedure: LEFT TOTAL KNEE ARTHROPLASTY WITH COMPUTER NAVIGATION;  Surgeon: Rod Can, MD;  Location: [facility];  Service: Orthopedics;  Laterality: Left; . LASER OF PROSTATE W/ GREEN LIGHT PVP   . TENDON REPAIR    right arm . TRIGGER FINGER RELEASE    bilateral hands . WRIST SURGERY    Bilateral HPI: Tobin Witucki Wilsonis a 83 y.o.malewith medical history significant for Paroxysmal AFib, GERD, hiatal hernia, Barrett's esophagus, multiple lung nodules, bladder CA, non-Hodgkin's lymphoma, OA, hyperlipidemia who presented to the ED with c/o generalized weakness and fall. Chest x-ray showed possible CHF with superimposed pneumonia in the middle left lung base. Head CT showed no acute abnormalities, small vessel ischemic disease white matter changes,and ethmoidal paranasal sinus disease. Barium esophagram poor esophageal motility. No stricture or mass. Barium tablet passed readily into the stomach. Negative for reflux. No Data Recorded Assessment / Plan / Recommendation CHL IP CLINICAL IMPRESSIONS 06/17/2016 Clinical Impression  Pt demonstrates a moderate oropharyngeal dysphagia with neuromuscular deficits impacting sensory and motor function. Pt observed to have bilateral ptosis, anterior spillage of secretions on the left (family reports history of left facial droop due to Bell's palsy) and weak cough strength upon arrival to exam. Despite deficits pt apepars conginitively intact and is able to follow complex commands.  Oral function chaacteraized by mild lingual weakness observed via mild lingual residuals, with functional abiltiy to masticate and transit soft solids (though endurance not taxed). Flash penetration and silent aspriation during the swallow occured with boluses larger than  23m (estimate) due to slow laryngeal closure and epiglottic deflection. Pt was able trigger a timely swallow and eventually achieve full laryngeal closure which expelled penetrate when possible, but again sluggish movement kept vestible partialy open during bolus passage. Further silent aspiration events also occured after the swallow if pt was cued to cough, which spilled valleuular residual into the airway. Attempted chin tuck without improvement and mendelsohn with visual biofeedback (pt unable after 5 attempts with max cues). Best methods included verbal cues for small sips (bolus size consistently comparable in appearance and fucntion with 577mteaspoon), oral hold and effortful swallow x2. Given increased residuals with thicker textures, recommend pt initiate a dys 2/thin liquid diet. Recommend water only with oral care at least 3x day with toothbrush and paste. Pt will benefit from exercises to target hyolaryngeal excursion and base of tongue strength as well as EMST. Will f/u for education with family.  SLP Visit Diagnosis Dysphagia, oropharyngeal phase (R13.12) Attention and concentration deficit following -- Frontal lobe and executive function deficit following -- Impact on safety and function --   [facility] IP TREATMENT RECOMMENDATION 06/17/2016 Treatment Recommendations Therapy as outlined in treatment plan below   Prognosis 06/17/2016 Prognosis for Safe Diet Advancement Good Barriers to Reach Goals -- Barriers/Prognosis Comment -- [facility] IP DIET RECOMMENDATION 06/17/2016 SLP Diet Recommendations Dysphagia 2 (Fine chop) solids;Thin liquid Liquid Administration via Cup Medication Administration Whole meds with puree Compensations Slow rate;Small sips/bites;Effortful swallow;Minimize environmental distractions;Clear throat intermittently Postural Changes Remain semi-upright after after feeds/meals (Comment)   [facility] IP OTHER RECOMMENDATIONS 06/17/2016 Recommended Consults -- Oral Care Recommendations Oral care before and  after PO Other Recommendations --  [facility] IP FOLLOW UP RECOMMENDATIONS 06/17/2016 Follow up Recommendations 24 hour supervision/assistance   [facility] IP FREQUENCY AND DURATION 06/17/2016 Speech Therapy Frequency (ACUTE ONLY) min 3x week Treatment Duration 2 weeks      [facility]L IP ORAL PHASE 06/17/2016 Oral Phase Impaired Oral - Pudding Teaspoon -- Oral - Pudding Cup -- Oral - Honey Teaspoon -- Oral - Honey Cup -- Oral - Nectar Teaspoon -- Oral - Nectar Cup -- Oral - Nectar Straw -- Oral - Thin Teaspoon -- Oral - Thin Cup Left anterior bolus loss;Lingual/palatal residue Oral - Thin Straw -- Oral - Puree WFL Oral - Mech Soft WFL Oral - Regular -- Oral - Multi-Consistency -- Oral - Pill -- Oral Phase - Comment --  [facility]L IP PHARYNGEAL PHASE 06/17/2016 Pharyngeal Phase Impaired Pharyngeal- Pudding Teaspoon -- Pharyngeal -- Pharyngeal- Pudding Cup -- Pharyngeal -- Pharyngeal- Honey Teaspoon -- Pharyngeal -- Pharyngeal- Honey Cup -- Pharyngeal -- Pharyngeal- Nectar Teaspoon -- Pharyngeal -- Pharyngeal- Nectar Cup -- Pharyngeal -- Pharyngeal- Nectar Straw -- Pharyngeal -- Pharyngeal- Thin Teaspoon -- Pharyngeal -- Pharyngeal- Thin Cup Reduced anterior laryngeal mobility;Penetration/Aspiration during swallow;Reduced tongue base retraction;Penetration/Apiration after swallow;Moderate aspiration;Trace aspiration;Pharyngeal residue - valleculae;Compensatory strategies attempted (with notebox) Pharyngeal Material enters airway, passes BELOW cords without attempt by patient to eject out (silent aspiration);Material enters airway, CONTACTS cords and then ejected out;Material does not enter airway Pharyngeal- Thin Straw -- Pharyngeal -- Pharyngeal- Puree Reduced anterior laryngeal mobility;Reduced tongue base retraction;Pharyngeal residue - valleculae;Compensatory strategies attempted (with notebox) Pharyngeal -- Pharyngeal- Mechanical Soft Reduced anterior laryngeal mobility;Reduced tongue base retraction;Pharyngeal residue -  valleculae;Compensatory strategies attempted (with notebox) Pharyngeal -- Pharyngeal- Regular -- Pharyngeal -- Pharyngeal- Multi-consistency -- Pharyngeal -- Pharyngeal- Pill -- Pharyngeal -- Pharyngeal Comment --  No flowsheet data found. CHL IP GO 06/16/2016 Functional Assessment Tool Used skilled clinical judgement Functional Limitations Swallowing Swallow Current Status [phone]) CK Swallow Goal Status (H6073) [place] Swallow Discharge Status [phone]) (None) Motor Speech Current Status [phone]) (None) Motor Speech Goal Status (I6270) (None) Motor Speech Goal Status (J5009) (None) Spoken Language Comprehension Current Status (F8182) (None) Spoken Language Comprehension Goal Status (X9371) (None) Spoken Language Comprehension Discharge Status [phone]) (None) Spoken Language Expression Current Status [phone]) (None) Spoken Language Expression Goal Status [phone]) (None) Spoken Language Expression Discharge Status [phone]) (None) Attention Current Status (D7824) (None) Attention Goal Status (M3536) (None) Attention Discharge Status [phone]) (None) Memory Current Status (V4008) (None) Memory Goal Status (Q7619) (None) Memory Discharge Status (J0932) (None) Voice Current Status (I7124) (None) Voice Goal Status (P8099) (None) Voice Discharge Status (I3382) (None) Other Speech-Language Pathology Functional Limitation Current Status (N0539) (None) Other Speech-Language Pathology Functional Limitation Goal Status (J6734) (None) Other Speech-Language Pathology Functional Limitation Discharge Status [phone]) (None) DeBlois, Katherene Ponto 06/17/2016, 2:21 PM              Ct Head Code Stroke W/o Cm  Addendum Date: 06/30/2016   ADDENDUM REPORT: 06/30/2016 16:06 ADDENDUM: Study discussed by telephone on 06/30/2016 with Dr. Harrell Gave Tegeler, at 1534 hours. Electronically Signed   By: Genevie Ann M.D.   On: 06/30/2016 16:06   Result Date: 06/30/2016 CLINICAL DATA:  Code stroke. 83 year old male with episode of right leg weakness slurred  speech and right facial droop which lasted about 1 hour today. Facial droop persists but other symptoms have resolved. Initial encounter. EXAM: CT HEAD WITHOUT CONTRAST TECHNIQUE: Contiguous axial images were obtained from the base of the skull through the vertex without intravenous contrast. COMPARISON:  Brain MRI 06/16/2016 and earlier. FINDINGS: Brain: Stable cerebral volume.  No midline shift, mass effect, or evidence of intracranial mass lesion. No ventriculomegaly. No acute intracranial hemorrhage identified. Stable gray-white matter differentiation throughout the brain. Chronic white matter hypodensity appears stable. No cortically based acute infarct identified. Vascular: Calcified atherosclerosis at the skull base. No suspicious intracranial vascular hyperdensity. Skull: No acute osseous abnormality identified. Sinuses/Orbits: Visualized paranasal sinuses and mastoids are stable and well pneumatized. Other: No acute orbit or scalp soft tissue findings. ASPECTS (Twain Stroke Program Early CT Score) - Ganglionic level infarction (caudate, lentiform nuclei, internal capsule, insula, M1-M3 cortex): 7 - Supraganglionic infarction (M4-M6 cortex): 3 Total score (0-10 with 10 being normal): 10 IMPRESSION: 1. Stable non contrast CT appearance of the brain, no acute intracranial abnormality. 2. ASPECTS is 10. 3. Chronic white matter changes. Electronically Signed: By: Genevie Ann M.D. On: 06/30/2016 15:24    echocardiogram  ------------------------------------------------------------------- LV EF: 55% -   60%  ------------------------------------------------------------------- Indications:      CHF - 428.0.  ------------------------------------------------------------------- History:   PMH:  Unresponsive episode.  Atrial fibrillation.  Risk factors:  Dyslipidemia.  ------------------------------------------------------------------- Study Conclusions  - Left ventricle: The cavity size was normal.  Wall thickness was   increased in a pattern of severe LVH. Systolic function was   normal. The estimated ejection fraction was in the range of 55%   to 60%. Although no diagnostic regional wall motion abnormality   was identified, this possibility cannot be completely excluded on   the basis of this study. Doppler parameters are consistent with   abnormal left ventricular relaxation (grade 1 diastolic   dysfunction). - Aortic valve: Trileaflet; moderately calcified leaflets. There   was no stenosis. - Aorta: Mildly dilated aortic root and ascending aorta. Aortic   root dimension: 40 mm (ED). Ascending aortic diameter: 38 mm (S). - Mitral valve: Mildly calcified annulus. - Right ventricle: The cavity size was mildly dilated. Systolic   function was normal. - Tricuspid valve: Peak RV-RA gradient (S): 28 mm Hg. - Systemic veins: IVC was not visualized.  Impressions:  - Technically difficult study with poor acoustic windows. Normal LV   size with severe LV hypertrophy. EF 55-60. Mildly dilated RV with   normal systolic function. No significant valvular abnormalities.       Filed Weights   07/05/16 0549 07/06/16 0523 07/07/16 0440  Weight: 106.6 kg (235 lb) 105.8 kg (233 lb 3.2 oz) 105.1 kg (231 lb 12.8 oz)     Microbiology: Recent Results (from the past 240 hour(s))  Culture, Urine     Status: Abnormal   Collection Time: 06/30/16  5:30 PM  Result Value Ref Range Status   Specimen Description URINE, RANDOM  Final   Special Requests NONE  Final   Culture <10,000 COLONIES/mL INSIGNIFICANT GROWTH (A)  Final   Report Status 07/02/2016 FINAL  Final       Blood Culture    Component Value Date/Time   SDES URINE, RANDOM 06/30/2016 1730   SPECREQUEST NONE 06/30/2016 1730   CULT <10,000 COLONIES/mL INSIGNIFICANT GROWTH (A) 06/30/2016 1730   REPTSTATUS 07/02/2016 FINAL 06/30/2016 1730      Labs: Results for orders placed or performed during the hospital encounter of  06/30/16 (from the past 48 hour(s))  Glucose, capillary     Status: Abnormal   Collection Time: 07/05/16 11:53 AM  Result Value Ref Range   Glucose-Capillary 103 (H) 65 - 99 mg/dL   Comment 1 Notify RN    Comment 2 Document in Chart   Glucose, capillary  Status: Abnormal   Collection Time: 07/05/16  4:15 PM  Result Value Ref Range   Glucose-Capillary 120 (H) 65 - 99 mg/dL   Comment 1 Notify RN    Comment 2 Document in Chart   Glucose, capillary     Status: Abnormal   Collection Time: 07/05/16  8:57 PM  Result Value Ref Range   Glucose-Capillary 102 (H) 65 - 99 mg/dL   Comment 1 Notify RN    Comment 2 Document in Chart   Glucose, capillary     Status: Abnormal   Collection Time: 07/06/16  1:13 AM  Result Value Ref Range   Glucose-Capillary 100 (H) 65 - 99 mg/dL   Comment 1 Notify RN    Comment 2 Document in Chart   Basic metabolic panel     Status: Abnormal   Collection Time: 07/06/16  3:49 AM  Result Value Ref Range   Sodium 137 135 - 145 mmol/L   Potassium 3.3 (L) 3.5 - 5.1 mmol/L   Chloride 106 101 - 111 mmol/L   CO2 24 22 - 32 mmol/L   Glucose, Bld 93 65 - 99 mg/dL   BUN 13 6 - 20 mg/dL   Creatinine, Ser 0.83 0.61 - 1.24 mg/dL   Calcium 8.6 (L) 8.9 - 10.3 mg/dL   GFR calc non Af Amer >60 >60 mL/min   GFR calc Af Amer >60 >60 mL/min    Comment: (NOTE) The eGFR has been calculated using the CKD EPI equation. This calculation has not been validated in all clinical situations. eGFR's persistently <60 mL/min signify possible Chronic Kidney Disease.    Anion gap 7 5 - 15  Glucose, capillary     Status: None   Collection Time: 07/06/16  4:43 AM  Result Value Ref Range   Glucose-Capillary 89 65 - 99 mg/dL   Comment 1 Notify RN    Comment 2 Document in Chart   Glucose, capillary     Status: Abnormal   Collection Time: 07/06/16  8:31 AM  Result Value Ref Range   Glucose-Capillary 102 (H) 65 - 99 mg/dL  Glucose, capillary     Status: Abnormal   Collection Time:  07/06/16 11:22 AM  Result Value Ref Range   Glucose-Capillary 126 (H) 65 - 99 mg/dL  Glucose, capillary     Status: None   Collection Time: 07/06/16  4:32 PM  Result Value Ref Range   Glucose-Capillary 91 65 - 99 mg/dL  Glucose, capillary     Status: Abnormal   Collection Time: 07/06/16  8:27 PM  Result Value Ref Range   Glucose-Capillary 177 (H) 65 - 99 mg/dL   Comment 1 Notify RN    Comment 2 Document in Chart   Glucose, capillary     Status: None   Collection Time: 07/07/16 12:24 AM  Result Value Ref Range   Glucose-Capillary 87 65 - 99 mg/dL   Comment 1 Notify RN    Comment 2 Document in Chart   Glucose, capillary     Status: None   Collection Time: 07/07/16  4:18 AM  Result Value Ref Range   Glucose-Capillary 86 65 - 99 mg/dL  Basic metabolic panel     Status: Abnormal   Collection Time: 07/07/16  4:29 AM  Result Value Ref Range   Sodium 138 135 - 145 mmol/L   Potassium 3.9 3.5 - 5.1 mmol/L   Chloride 107 101 - 111 mmol/L   CO2 22 22 - 32 mmol/L   Glucose, Bld  95 65 - 99 mg/dL   BUN 15 6 - 20 mg/dL   Creatinine, Ser 0.97 0.61 - 1.24 mg/dL   Calcium 8.8 (L) 8.9 - 10.3 mg/dL   GFR calc non Af Amer >60 >60 mL/min   GFR calc Af Amer >60 >60 mL/min    Comment: (NOTE) The eGFR has been calculated using the CKD EPI equation. This calculation has not been validated in all clinical situations. eGFR's persistently <60 mL/min signify possible Chronic Kidney Disease.    Anion gap 9 5 - 15  CBC     Status: Abnormal   Collection Time: 07/07/16  4:29 AM  Result Value Ref Range   WBC 7.0 4.0 - 10.5 K/uL   RBC 4.26 4.22 - 5.81 MIL/uL   Hemoglobin 11.7 (L) 13.0 - 17.0 g/dL   HCT 36.0 (L) 39.0 - 52.0 %   MCV 84.5 78.0 - 100.0 fL   MCH 27.5 26.0 - 34.0 pg   MCHC 32.5 30.0 - 36.0 g/dL   RDW 14.0 11.5 - 15.5 %   Platelets 154 150 - 400 K/uL  Glucose, capillary     Status: None   Collection Time: 07/07/16  7:36 AM  Result Value Ref Range   Glucose-Capillary 83 65 - 99 mg/dL      Lipid Panel     Component Value Date/Time   CHOL 123 07/01/2016 0746   TRIG 52 07/01/2016 0746   HDL 45 07/01/2016 0746   CHOLHDL 2.7 07/01/2016 0746   VLDL 10 07/01/2016 0746   LDLCALC 68 07/01/2016 0746     Lab Results  Component Value Date   HGBA1C 5.5 07/01/2016       HPI :*  83 y.o.right handed malewith medical history significant of PAF on Eliquis, Barrett's esophagus, bladder cancer, non-Hodgkin's lymphoma, OA, andHLD; who presents with acute onset of slurred speech, right-sided facial droop, and right sided upper and lower extremity weakness. He had just been recently hospitalized from 3/14 -3/19 for community-acquired pneumonia. During hospitalization he had an event of unresponsiveness with bradycardia, but cardiology felt it was noncardiac in nature. Patient had negative MRI, MRA, and EEG studies. Due to swallowing difficulties patient was evaluated by speech therapy and seen to be silently aspirating.Upon discharge he was sent to a skilled nursing facilityfor rehabilitation and completed afull course of Levaquin on 3/21.  3/29  he was noted to have a sudden right facial droop, worsening of dysarthria and right leg plegia. Neurology was called and patient was brought for a emergent CT perfusion to evaluate for M1 stenosis or occlusion. MRI showed acute stroke  HOSPITAL COURSE:    Stroke: L paramedian pontineinfarct secondary to small vessel disease source. However, pt has pAfib on eliquis    CTA head/neck no ELVO. Atherosclerosis. Multilevel spondylosis  CT Perfusion negative. No acute infarct  MRIacute L paramedian pontine infarct. small vessel disease.   LE venous doppler 06/15/16 NO DVT. R&L enlarged lymph nodes  2D Echo3/14/18 severe LVH. EF 55-60%. No valve abnormalities  Pan CT negative for malignancy,No recurrence of lymphadenopathy identified within chest, abdomen or pelvis  LDL68,Statin    HgbA1c5.5  UDS negative  Patient had  Cortrak  tube which  fell out 4/2,  Not Replaced 4/3 , successful MBS now started on a diet -dysphagia 1, honey thick, may need to consider PEG tube if patient has recurrent aspiration issues ,IR on standby  Eliquis (apixaban) dailyprior to admission,  briefly started on   lovenox therapeutic dose due to NPO  status.   resumed eliquis 4/3   therapy recommendations: SNF [name]-anticipate discharge 4/5  Pt has follow up appointment with Dr. [name] at [facility] on 07/07/16   Physical deconditioning, drooling and swallowing difficulty - ? Neuromuscular disorder vs. ? Hx of stroke Admitted 2 weeks ago and concerning for neuromuscular disease MG panel negative  Recommended outpt EMG/NCS Has appointment with Dr. [name] at [facility] on 07/07/16 Pt had left drooling intermittently for the last 2 years Pt has swallow difficulty for 6 months CT and MRI showed remote right IC genu infarct and right thalamic punctate infarcts Pan CT No evidence of advanced malignancy or recurrence of lymphoma    Previous diet recommendations from hospitalization on 3/14:  Previously on Dysphagia 3 (mechanical soft);Thin liquid Patient now on a dysphagia 1 diet and honey thick liquids since 4/3    Recent community-acquired pneumonia: Chest x-ray overall improved. - Continue Mucinex, completed Levaquin  Paroxysmal atrial fibrillation: Chadsvasc score = 5 - Continue Eliquis  ed  Hyperlipidemia - Continue simvastatin  BPH - Continue Flomax   Abnormal UA Urine culture insignificant  Normocytic normochromic anemia: Stable - Continue to monitor   GERD with H/O Barrett's esophagus Continue PPI  obesity, Body mass index is 31.75 kg/m., recommend weight loss, diet and exercise as appropriate   Hypokalemia-replete    Discharge Exam:   Blood pressure (!) 141/55, pulse (!) 57, temperature 99 F (37.2 C), temperature source Oral, resp. rate 20, height _0  (1.778 m), weight 105.1 kg (231 lb 12.8  oz), SpO2 98 %.  General exam: Appears calm and comfortable  Respiratory system: Clear to auscultation. Respiratory effort normal. Cardiovascular system: S1 & S2 heard, RRR. No JVD, murmurs, rubs, gallops or clicks. No pedal edema. Gastrointestinal system: Abdomen is nondistended, soft and nontender. No organomegaly or masses felt. Normal bowel sounds heard. Central nervous system:  dysarthria,right nasolabial fold flattening Extremities: Symmetric 5 x 5 power. Skin: No rashes, lesions or ulcers Psychiatry: Judgement and insight appear normal. Mood & affect appropriate     Contact information for follow-up providers    Marcial Pacas, MD. Go on 07/07/2016.   Specialty:  Neurology Contact information: [place] [address] [phone]            Contact information for after-discharge care    Destination    [name] .   Specialty:  Skilled Nursing Facility Contact information: [address] [place] [phone]                  Signed: Reyne Dumas 07/07/2016, 9:36 AM        Time spent >45 mins

## 2016-08-07 DIAGNOSIS — M1990 Unspecified osteoarthritis, unspecified site: Secondary | ICD-10-CM | POA: Diagnosis not present

## 2016-08-07 DIAGNOSIS — I130 Hypertensive heart and chronic kidney disease with heart failure and stage 1 through stage 4 chronic kidney disease, or unspecified chronic kidney disease: Secondary | ICD-10-CM | POA: Diagnosis not present

## 2016-08-07 DIAGNOSIS — I69351 Hemiplegia and hemiparesis following cerebral infarction affecting right dominant side: Secondary | ICD-10-CM | POA: Diagnosis not present

## 2016-08-07 DIAGNOSIS — I509 Heart failure, unspecified: Secondary | ICD-10-CM | POA: Diagnosis not present

## 2016-08-07 DIAGNOSIS — Z8701 Personal history of pneumonia (recurrent): Secondary | ICD-10-CM | POA: Diagnosis not present

## 2016-08-07 DIAGNOSIS — I69322 Dysarthria following cerebral infarction: Secondary | ICD-10-CM | POA: Diagnosis not present

## 2016-08-08 DIAGNOSIS — I509 Heart failure, unspecified: Secondary | ICD-10-CM | POA: Diagnosis not present

## 2016-08-08 DIAGNOSIS — I69351 Hemiplegia and hemiparesis following cerebral infarction affecting right dominant side: Secondary | ICD-10-CM | POA: Diagnosis not present

## 2016-08-08 DIAGNOSIS — I69322 Dysarthria following cerebral infarction: Secondary | ICD-10-CM | POA: Diagnosis not present

## 2016-08-08 DIAGNOSIS — M1990 Unspecified osteoarthritis, unspecified site: Secondary | ICD-10-CM | POA: Diagnosis not present

## 2016-08-08 DIAGNOSIS — I130 Hypertensive heart and chronic kidney disease with heart failure and stage 1 through stage 4 chronic kidney disease, or unspecified chronic kidney disease: Secondary | ICD-10-CM | POA: Diagnosis not present

## 2016-08-08 DIAGNOSIS — Z8701 Personal history of pneumonia (recurrent): Secondary | ICD-10-CM | POA: Diagnosis not present

## 2016-08-15 DIAGNOSIS — M1990 Unspecified osteoarthritis, unspecified site: Secondary | ICD-10-CM | POA: Diagnosis not present

## 2016-08-15 DIAGNOSIS — I130 Hypertensive heart and chronic kidney disease with heart failure and stage 1 through stage 4 chronic kidney disease, or unspecified chronic kidney disease: Secondary | ICD-10-CM | POA: Diagnosis not present

## 2016-08-15 DIAGNOSIS — I69351 Hemiplegia and hemiparesis following cerebral infarction affecting right dominant side: Secondary | ICD-10-CM | POA: Diagnosis not present

## 2016-08-15 DIAGNOSIS — Z8701 Personal history of pneumonia (recurrent): Secondary | ICD-10-CM | POA: Diagnosis not present

## 2016-08-15 DIAGNOSIS — I69322 Dysarthria following cerebral infarction: Secondary | ICD-10-CM | POA: Diagnosis not present

## 2016-08-15 DIAGNOSIS — I509 Heart failure, unspecified: Secondary | ICD-10-CM | POA: Diagnosis not present

## 2016-08-18 DIAGNOSIS — Z8701 Personal history of pneumonia (recurrent): Secondary | ICD-10-CM | POA: Diagnosis not present

## 2016-08-18 DIAGNOSIS — I69351 Hemiplegia and hemiparesis following cerebral infarction affecting right dominant side: Secondary | ICD-10-CM | POA: Diagnosis not present

## 2016-08-18 DIAGNOSIS — M1990 Unspecified osteoarthritis, unspecified site: Secondary | ICD-10-CM | POA: Diagnosis not present

## 2016-08-18 DIAGNOSIS — I509 Heart failure, unspecified: Secondary | ICD-10-CM | POA: Diagnosis not present

## 2016-08-18 DIAGNOSIS — I69322 Dysarthria following cerebral infarction: Secondary | ICD-10-CM | POA: Diagnosis not present

## 2016-08-18 DIAGNOSIS — I130 Hypertensive heart and chronic kidney disease with heart failure and stage 1 through stage 4 chronic kidney disease, or unspecified chronic kidney disease: Secondary | ICD-10-CM | POA: Diagnosis not present

## 2016-08-23 DIAGNOSIS — I509 Heart failure, unspecified: Secondary | ICD-10-CM | POA: Diagnosis not present

## 2016-08-23 DIAGNOSIS — M1990 Unspecified osteoarthritis, unspecified site: Secondary | ICD-10-CM | POA: Diagnosis not present

## 2016-08-23 DIAGNOSIS — H353221 Exudative age-related macular degeneration, left eye, with active choroidal neovascularization: Secondary | ICD-10-CM | POA: Diagnosis not present

## 2016-08-23 DIAGNOSIS — I69351 Hemiplegia and hemiparesis following cerebral infarction affecting right dominant side: Secondary | ICD-10-CM | POA: Diagnosis not present

## 2016-08-23 DIAGNOSIS — I69322 Dysarthria following cerebral infarction: Secondary | ICD-10-CM | POA: Diagnosis not present

## 2016-08-23 DIAGNOSIS — I130 Hypertensive heart and chronic kidney disease with heart failure and stage 1 through stage 4 chronic kidney disease, or unspecified chronic kidney disease: Secondary | ICD-10-CM | POA: Diagnosis not present

## 2016-08-23 DIAGNOSIS — Z8701 Personal history of pneumonia (recurrent): Secondary | ICD-10-CM | POA: Diagnosis not present

## 2016-08-25 DIAGNOSIS — I69322 Dysarthria following cerebral infarction: Secondary | ICD-10-CM | POA: Diagnosis not present

## 2016-08-25 DIAGNOSIS — Z8701 Personal history of pneumonia (recurrent): Secondary | ICD-10-CM | POA: Diagnosis not present

## 2016-08-25 DIAGNOSIS — M1990 Unspecified osteoarthritis, unspecified site: Secondary | ICD-10-CM | POA: Diagnosis not present

## 2016-08-25 DIAGNOSIS — I130 Hypertensive heart and chronic kidney disease with heart failure and stage 1 through stage 4 chronic kidney disease, or unspecified chronic kidney disease: Secondary | ICD-10-CM | POA: Diagnosis not present

## 2016-08-25 DIAGNOSIS — I509 Heart failure, unspecified: Secondary | ICD-10-CM | POA: Diagnosis not present

## 2016-08-25 DIAGNOSIS — I69351 Hemiplegia and hemiparesis following cerebral infarction affecting right dominant side: Secondary | ICD-10-CM | POA: Diagnosis not present

## 2016-08-26 DIAGNOSIS — I69322 Dysarthria following cerebral infarction: Secondary | ICD-10-CM | POA: Diagnosis not present

## 2016-08-26 DIAGNOSIS — M1990 Unspecified osteoarthritis, unspecified site: Secondary | ICD-10-CM | POA: Diagnosis not present

## 2016-08-26 DIAGNOSIS — I130 Hypertensive heart and chronic kidney disease with heart failure and stage 1 through stage 4 chronic kidney disease, or unspecified chronic kidney disease: Secondary | ICD-10-CM | POA: Diagnosis not present

## 2016-08-26 DIAGNOSIS — Z8701 Personal history of pneumonia (recurrent): Secondary | ICD-10-CM | POA: Diagnosis not present

## 2016-08-26 DIAGNOSIS — I509 Heart failure, unspecified: Secondary | ICD-10-CM | POA: Diagnosis not present

## 2016-08-26 DIAGNOSIS — I69351 Hemiplegia and hemiparesis following cerebral infarction affecting right dominant side: Secondary | ICD-10-CM | POA: Diagnosis not present

## 2016-08-30 DIAGNOSIS — I130 Hypertensive heart and chronic kidney disease with heart failure and stage 1 through stage 4 chronic kidney disease, or unspecified chronic kidney disease: Secondary | ICD-10-CM | POA: Diagnosis not present

## 2016-08-30 DIAGNOSIS — Z8701 Personal history of pneumonia (recurrent): Secondary | ICD-10-CM | POA: Diagnosis not present

## 2016-08-30 DIAGNOSIS — I69351 Hemiplegia and hemiparesis following cerebral infarction affecting right dominant side: Secondary | ICD-10-CM | POA: Diagnosis not present

## 2016-08-30 DIAGNOSIS — M1990 Unspecified osteoarthritis, unspecified site: Secondary | ICD-10-CM | POA: Diagnosis not present

## 2016-08-30 DIAGNOSIS — I69322 Dysarthria following cerebral infarction: Secondary | ICD-10-CM | POA: Diagnosis not present

## 2016-08-30 DIAGNOSIS — I509 Heart failure, unspecified: Secondary | ICD-10-CM | POA: Diagnosis not present

## 2016-09-01 DIAGNOSIS — Z8701 Personal history of pneumonia (recurrent): Secondary | ICD-10-CM | POA: Diagnosis not present

## 2016-09-01 DIAGNOSIS — I509 Heart failure, unspecified: Secondary | ICD-10-CM | POA: Diagnosis not present

## 2016-09-01 DIAGNOSIS — I130 Hypertensive heart and chronic kidney disease with heart failure and stage 1 through stage 4 chronic kidney disease, or unspecified chronic kidney disease: Secondary | ICD-10-CM | POA: Diagnosis not present

## 2016-09-01 DIAGNOSIS — I69322 Dysarthria following cerebral infarction: Secondary | ICD-10-CM | POA: Diagnosis not present

## 2016-09-01 DIAGNOSIS — I69351 Hemiplegia and hemiparesis following cerebral infarction affecting right dominant side: Secondary | ICD-10-CM | POA: Diagnosis not present

## 2016-09-01 DIAGNOSIS — M1990 Unspecified osteoarthritis, unspecified site: Secondary | ICD-10-CM | POA: Diagnosis not present

## 2016-09-05 ENCOUNTER — Ambulatory Visit (INDEPENDENT_AMBULATORY_CARE_PROVIDER_SITE_OTHER): Payer: Medicare Other | Admitting: Neurology

## 2016-09-05 ENCOUNTER — Encounter: Payer: Self-pay | Admitting: Neurology

## 2016-09-05 ENCOUNTER — Encounter (INDEPENDENT_AMBULATORY_CARE_PROVIDER_SITE_OTHER): Payer: Self-pay

## 2016-09-05 VITALS — BP 137/67 | HR 66 | Ht 70.0 in | Wt 222.0 lb

## 2016-09-05 DIAGNOSIS — I6300 Cerebral infarction due to thrombosis of unspecified precerebral artery: Secondary | ICD-10-CM | POA: Diagnosis not present

## 2016-09-05 DIAGNOSIS — I69322 Dysarthria following cerebral infarction: Secondary | ICD-10-CM | POA: Diagnosis not present

## 2016-09-05 DIAGNOSIS — R269 Unspecified abnormalities of gait and mobility: Secondary | ICD-10-CM | POA: Diagnosis not present

## 2016-09-05 DIAGNOSIS — G20 Parkinson's disease: Secondary | ICD-10-CM | POA: Insufficient documentation

## 2016-09-05 DIAGNOSIS — R531 Weakness: Secondary | ICD-10-CM | POA: Diagnosis not present

## 2016-09-05 DIAGNOSIS — I639 Cerebral infarction, unspecified: Secondary | ICD-10-CM

## 2016-09-05 DIAGNOSIS — Z8701 Personal history of pneumonia (recurrent): Secondary | ICD-10-CM | POA: Diagnosis not present

## 2016-09-05 DIAGNOSIS — I69351 Hemiplegia and hemiparesis following cerebral infarction affecting right dominant side: Secondary | ICD-10-CM | POA: Diagnosis not present

## 2016-09-05 DIAGNOSIS — G20A1 Parkinson's disease without dyskinesia, without mention of fluctuations: Secondary | ICD-10-CM | POA: Insufficient documentation

## 2016-09-05 DIAGNOSIS — I130 Hypertensive heart and chronic kidney disease with heart failure and stage 1 through stage 4 chronic kidney disease, or unspecified chronic kidney disease: Secondary | ICD-10-CM | POA: Diagnosis not present

## 2016-09-05 DIAGNOSIS — I509 Heart failure, unspecified: Secondary | ICD-10-CM | POA: Diagnosis not present

## 2016-09-05 DIAGNOSIS — M1990 Unspecified osteoarthritis, unspecified site: Secondary | ICD-10-CM | POA: Diagnosis not present

## 2016-09-05 MED ORDER — CARBIDOPA-LEVODOPA 25-100 MG PO TABS: 1.0000 | ORAL_TABLET | Freq: Three times a day (TID) | ORAL | 6 refills | Status: DC

## 2016-09-05 NOTE — Progress Notes (Signed)
PATIENT: Jason [name] DOB: 1927/06/01  Chief Complaint  Patient presents with  . R/O Myasthenia Gravis    He is here with his daughter, Jason [name]. He was sent here for further evaluation of his generalized weakness from his ED visit on 06/15/16.  Says his swallowing difficulty has resolved with speech therapy.  . Cerebrovascular Accident    Hospital follow up from having a stroke on 06/30/16.  He is currently in OT, PT and speech therapy.  He is using a rolling walker to assist with ambulation.  Marland Kitchen PCP    Jason Cruel, MD - referred from hospital     HISTORICAL  Jason [name] is a 83 year old right-handed male, seen in refer by hospital to follow-up on stroke, his primary care physician is Dr. Zenia Resides loss, is accompanied by his daughter Jason [name] at today's clinical visit, initial evaluation was on June fourth 2018.  I reviewed and summarized his most recent hospital discharge on July 07 2016,  He has past medical history of proximal atrial fibrillation, on Eliquis, Barrett's esophagus, bladder cancer, non-Hodgkin's lymphoma, was treated with chemo and radiation therapy in 2011, osteoarthritis, hyperlipidemia, he presented with acute stroke on March 29th 2018, right-sided facial droop, right upper and lower extremity weakness, I have personally reviewed MRI of the brain showed left paramedian pontine infarction secondary to small vessel disease, CT angiogram of head and neck showed no significant large vessel abnormality  Pan CT negative for malignancy, LDL was 68, A1c was 5.5, UDS was negative, myasthenia gravis panel was negative, CMP showed potassium of 3.2, CBC showed hemoglobin of 11.3, normal B12, A1c was 5.5, LDL was 68, cholesterol 123, LDL 68, normal TSH, CPK was 125, negative HIV,  He was also admitted to the hospital earlier for community-acquired pneumonia, presented with generalized weakness on June 14 2016, fell next morning, was take to hospital on March 14 to 19, 2018,   during hospital admission, he had an event of unresponsiveness bradykinesia,he was noted to have mild aspiration   Echocardiogram ejection 55-60%, no regional wall motion abnormality identified  He fell in 2017, was noted to have drooling, unsteady gait, began to use walker since then, his drooling, and mild swallowing difficulty has much improved with current speech physical therapy, is also receiving PT OT,  REVIEW OF SYSTEMS: Full 14 system review of systems performed and notable only for fatigue, hearing loss, cough, snoring, constipation, depression anxiety, snoring  ALLERGIES: No Known Allergies  HOME MEDICATIONS: Current Outpatient Prescriptions  Medication Sig Dispense Refill  . apixaban (ELIQUIS) 5 MG TABS tablet Take 5 mg by mouth 2 (two) times daily.    Jason [name] Sodium (COLACE PO) Take by mouth as needed.    Marland Kitchen esomeprazole (NEXIUM) 40 MG capsule TAKE 1 CAPSULE (40 MG TOTAL) BY MOUTH 2 (TWO) TIMES DAILY BEFORE A MEAL. 60 capsule 6  . fish oil-omega-3 fatty acids 1000 MG capsule Take 1 g by mouth 2 (two) times daily.     . Multiple Vitamins-Minerals (ICAPS MV PO) Take 1 capsule by mouth 2 (two) times daily.     Jason [name] (SYSTANE) 0.4-0.3 % SOLN Place 1 drop into both eyes daily as needed (for dry eyes).     . Sennosides (SENOKOT PO) Take by mouth as needed.    . simvastatin (ZOCOR) 40 MG tablet Take 40 mg by mouth at bedtime.      . tamsulosin (FLOMAX) 0.4 MG CAPS capsule Take 0.4 mg by mouth at  bedtime.    . traZODone (DESYREL) 50 MG tablet Take 50 mg by mouth at bedtime as needed for sleep.     No current facility-administered medications for this visit.     PAST MEDICAL HISTORY: Past Medical History:  Diagnosis Date  . Arthritis   . Barrett esophagus   . Bladder cancer ([place]) dx'd 3474,2595   surg only  . Colon polyps    adenomatous  . Difficulty sleeping    OCCASIONALLY  . Diverticulosis of colon (without mention of hemorrhage)   .  Frequency of urination   . GERD (gastroesophageal reflux disease)   . Hiatal hernia   . History of bladder cancer 1981   EXCISION ONLY  . History of skin cancer    BASAL CELL  . Hyperlipidemia   . Lymphoma, non-Hodgkin's ([place]) 2011   [place]   . Macular degeneration    LEFT EYE  . Multiple nodules of lung 09/16/2011  . PAF (paroxysmal atrial fibrillation) ([place])   . Personal history of colonic polyps 07/15/2010   tubular adenoma  . Prostate cancer [facility])    patient denies  . PUD (peptic ulcer disease)   . Stomach cancer ([place])    [facility] origin  . Stroke ([place])   . Weakness     PAST SURGICAL HISTORY: Past Surgical History:  Procedure Laterality Date  . APPENDECTOMY    . bone spur     left shoulder  . CHOLECYSTECTOMY    . KNEE ARTHROPLASTY Left 07/30/2015   Procedure: LEFT TOTAL KNEE ARTHROPLASTY WITH COMPUTER NAVIGATION;  Surgeon: Rod Can, MD;  Location: [facility];  Service: Orthopedics;  Laterality: Left;  . LASER OF PROSTATE W/ GREEN LIGHT PVP    . TENDON REPAIR     right arm  . TRIGGER FINGER RELEASE     bilateral hands  . WRIST SURGERY     Bilateral    FAMILY HISTORY: Family History  Problem Relation Age of Onset  . Heart attack Father   . Other Mother        "old age"    SOCIAL HISTORY:  Social History   Social History  . Marital status: Widowed    Spouse name: N/A  . Number of children: 2  . Years of education: HS   Occupational History  . retired   .  Retired   Social History Main Topics  . Smoking status: Former Smoker    Quit date: 10/29/1966  . Smokeless tobacco: Former Systems developer    Quit date: 06/29/1998  . Alcohol use Yes     Comment: Socially  . Drug use: No  . Sexual activity: Not on file   Other Topics Concern  . Not on file   Social History Narrative   Lives at home alone.   Right-handed.   Occasional use of caffeine.     PHYSICAL EXAM   Vitals:   09/05/16 1622  BP: 137/67  Pulse: 66  Weight: 222 lb (100.7 kg)  Height:  5\' 10"  (1.778 m)    Not recorded      Body mass index is 31.85 kg/m.  PHYSICAL EXAMNIATION:  Gen: NAD, conversant, well nourised, obese, well groomed                     Cardiovascular: Regular rate rhythm, no peripheral edema, warm, nontender. Eyes: Conjunctivae clear without exudates or hemorrhage Neck: Supple, no carotid bruits. Pulmonary: Clear to auscultation bilaterally   NEUROLOGICAL EXAM:  MENTAL STATUS:  Speech:   Mild slurring, wet speech, fluent and spontaneous with normal comprehension.  Cognition:     Orientation to time, place and person     Normal recent and remote memory     Normal Attention span and concentration     Normal Language, naming, repeating,spontaneous speech     Fund of knowledge   CRANIAL NERVES: CN II: Visual fields are full to confrontation.  Pupils are round equal and briskly reactive to light. CN III, IV, VI: extraocular movement are normal. No ptosis. CN V: Facial sensation is intact to pinprick in all 3 divisions bilaterally. Corneal responses are intact.  CN VII: Face is symmetric with normal eye closure and smile. CN VIII: Hearing is normal to rubbing fingers CN IX, X: Palate elevates symmetrically. Phonation is normal. CN XI: Head turning and shoulder shrug are intact CN XII: Tongue is midline with normal movements and no atrophy.  MOTOR: He has slight right hemiparesis, fixation of right arm on rapid rotating movement, he was also noted to have mild to moderate rigidity bradykinesia, left worse than right, increased with reinforcement maneuver, also had mild nuchal rigidity  REFLEXES: Reflexes are 2+ and symmetric at the biceps, triceps, knees, and ankles. Plantar responses are flexor.  SENSORY: Intact to light touch, pinprick, positional sensation and vibratory sensation are intact in fingers and toes.  COORDINATION: Rapid alternating movements and fine finger movements are intact. There is no dysmetria on finger-to-nose and  heel-knee-shin.    GAIT/STANCE: He needs push up to get up from seated position, dragging his feet, cautious,   DIAGNOSTIC DATA (LABS, IMAGING, TESTING) - I reviewed patient records, labs, notes, testing and imaging myself where available.   ASSESSMENT AND PLAN  ADALID BECKMANN is a 83 y.o. male   Mild parkinsonian syndrome, gait abnormality  Add on Sinemet 25/100 mg 3 times a day,  Physical therapy evaluation before and after treatment Acute Involving left median pontine on June 30 2016  Continue to address his vascular risk factor, which include hypertension, hyperlipidemia, paroxysmal atrial fibrillation,  Continue Eliquis 5 mg twice a day Mild dysphagia, excessive drooling  Myasthenia gravis panel to rule out neuromuscular junctional disorder  Marcial Pacas, M.D. Ph.D.  [facility] [address] [place], [place] [address] Ph: [phone] Fax: [phone]  CC: Referring Provider

## 2016-09-05 NOTE — Patient Instructions (Signed)
Please OBSERVE patient's gait before and after Sinemet 25/100 mg 3 times a day  He should take medications with breakfast, lunch, dinner, about every 4 hours, last dose should be around 5-6 PM

## 2016-09-06 DIAGNOSIS — Z8701 Personal history of pneumonia (recurrent): Secondary | ICD-10-CM | POA: Diagnosis not present

## 2016-09-06 DIAGNOSIS — I69351 Hemiplegia and hemiparesis following cerebral infarction affecting right dominant side: Secondary | ICD-10-CM | POA: Diagnosis not present

## 2016-09-06 DIAGNOSIS — I69322 Dysarthria following cerebral infarction: Secondary | ICD-10-CM | POA: Diagnosis not present

## 2016-09-06 DIAGNOSIS — I130 Hypertensive heart and chronic kidney disease with heart failure and stage 1 through stage 4 chronic kidney disease, or unspecified chronic kidney disease: Secondary | ICD-10-CM | POA: Diagnosis not present

## 2016-09-06 DIAGNOSIS — M1990 Unspecified osteoarthritis, unspecified site: Secondary | ICD-10-CM | POA: Diagnosis not present

## 2016-09-06 DIAGNOSIS — I509 Heart failure, unspecified: Secondary | ICD-10-CM | POA: Diagnosis not present

## 2016-09-08 DIAGNOSIS — I69322 Dysarthria following cerebral infarction: Secondary | ICD-10-CM | POA: Diagnosis not present

## 2016-09-08 DIAGNOSIS — I69351 Hemiplegia and hemiparesis following cerebral infarction affecting right dominant side: Secondary | ICD-10-CM | POA: Diagnosis not present

## 2016-09-08 DIAGNOSIS — M1990 Unspecified osteoarthritis, unspecified site: Secondary | ICD-10-CM | POA: Diagnosis not present

## 2016-09-08 DIAGNOSIS — Z8701 Personal history of pneumonia (recurrent): Secondary | ICD-10-CM | POA: Diagnosis not present

## 2016-09-08 DIAGNOSIS — I509 Heart failure, unspecified: Secondary | ICD-10-CM | POA: Diagnosis not present

## 2016-09-08 DIAGNOSIS — I130 Hypertensive heart and chronic kidney disease with heart failure and stage 1 through stage 4 chronic kidney disease, or unspecified chronic kidney disease: Secondary | ICD-10-CM | POA: Diagnosis not present

## 2016-09-12 DIAGNOSIS — Z8701 Personal history of pneumonia (recurrent): Secondary | ICD-10-CM | POA: Diagnosis not present

## 2016-09-12 DIAGNOSIS — I69351 Hemiplegia and hemiparesis following cerebral infarction affecting right dominant side: Secondary | ICD-10-CM | POA: Diagnosis not present

## 2016-09-12 DIAGNOSIS — M1990 Unspecified osteoarthritis, unspecified site: Secondary | ICD-10-CM | POA: Diagnosis not present

## 2016-09-12 DIAGNOSIS — I130 Hypertensive heart and chronic kidney disease with heart failure and stage 1 through stage 4 chronic kidney disease, or unspecified chronic kidney disease: Secondary | ICD-10-CM | POA: Diagnosis not present

## 2016-09-12 DIAGNOSIS — I69322 Dysarthria following cerebral infarction: Secondary | ICD-10-CM | POA: Diagnosis not present

## 2016-09-12 DIAGNOSIS — I509 Heart failure, unspecified: Secondary | ICD-10-CM | POA: Diagnosis not present

## 2016-09-13 DIAGNOSIS — M1990 Unspecified osteoarthritis, unspecified site: Secondary | ICD-10-CM | POA: Diagnosis not present

## 2016-09-13 DIAGNOSIS — Z8701 Personal history of pneumonia (recurrent): Secondary | ICD-10-CM | POA: Diagnosis not present

## 2016-09-13 DIAGNOSIS — I69351 Hemiplegia and hemiparesis following cerebral infarction affecting right dominant side: Secondary | ICD-10-CM | POA: Diagnosis not present

## 2016-09-13 DIAGNOSIS — I509 Heart failure, unspecified: Secondary | ICD-10-CM | POA: Diagnosis not present

## 2016-09-13 DIAGNOSIS — I69322 Dysarthria following cerebral infarction: Secondary | ICD-10-CM | POA: Diagnosis not present

## 2016-09-13 DIAGNOSIS — I130 Hypertensive heart and chronic kidney disease with heart failure and stage 1 through stage 4 chronic kidney disease, or unspecified chronic kidney disease: Secondary | ICD-10-CM | POA: Diagnosis not present

## 2016-09-14 DIAGNOSIS — M1990 Unspecified osteoarthritis, unspecified site: Secondary | ICD-10-CM | POA: Diagnosis not present

## 2016-09-14 DIAGNOSIS — I69322 Dysarthria following cerebral infarction: Secondary | ICD-10-CM | POA: Diagnosis not present

## 2016-09-14 DIAGNOSIS — I130 Hypertensive heart and chronic kidney disease with heart failure and stage 1 through stage 4 chronic kidney disease, or unspecified chronic kidney disease: Secondary | ICD-10-CM | POA: Diagnosis not present

## 2016-09-14 DIAGNOSIS — Z8701 Personal history of pneumonia (recurrent): Secondary | ICD-10-CM | POA: Diagnosis not present

## 2016-09-14 DIAGNOSIS — I509 Heart failure, unspecified: Secondary | ICD-10-CM | POA: Diagnosis not present

## 2016-09-14 DIAGNOSIS — I69351 Hemiplegia and hemiparesis following cerebral infarction affecting right dominant side: Secondary | ICD-10-CM | POA: Diagnosis not present

## 2016-09-15 DIAGNOSIS — Z8701 Personal history of pneumonia (recurrent): Secondary | ICD-10-CM | POA: Diagnosis not present

## 2016-09-15 DIAGNOSIS — I69351 Hemiplegia and hemiparesis following cerebral infarction affecting right dominant side: Secondary | ICD-10-CM | POA: Diagnosis not present

## 2016-09-15 DIAGNOSIS — I69322 Dysarthria following cerebral infarction: Secondary | ICD-10-CM | POA: Diagnosis not present

## 2016-09-15 DIAGNOSIS — I130 Hypertensive heart and chronic kidney disease with heart failure and stage 1 through stage 4 chronic kidney disease, or unspecified chronic kidney disease: Secondary | ICD-10-CM | POA: Diagnosis not present

## 2016-09-15 DIAGNOSIS — I509 Heart failure, unspecified: Secondary | ICD-10-CM | POA: Diagnosis not present

## 2016-09-15 DIAGNOSIS — M1990 Unspecified osteoarthritis, unspecified site: Secondary | ICD-10-CM | POA: Diagnosis not present

## 2016-09-19 DIAGNOSIS — I69351 Hemiplegia and hemiparesis following cerebral infarction affecting right dominant side: Secondary | ICD-10-CM | POA: Diagnosis not present

## 2016-09-19 DIAGNOSIS — I509 Heart failure, unspecified: Secondary | ICD-10-CM | POA: Diagnosis not present

## 2016-09-19 DIAGNOSIS — Z8701 Personal history of pneumonia (recurrent): Secondary | ICD-10-CM | POA: Diagnosis not present

## 2016-09-19 DIAGNOSIS — I69322 Dysarthria following cerebral infarction: Secondary | ICD-10-CM | POA: Diagnosis not present

## 2016-09-19 DIAGNOSIS — M1990 Unspecified osteoarthritis, unspecified site: Secondary | ICD-10-CM | POA: Diagnosis not present

## 2016-09-19 DIAGNOSIS — I130 Hypertensive heart and chronic kidney disease with heart failure and stage 1 through stage 4 chronic kidney disease, or unspecified chronic kidney disease: Secondary | ICD-10-CM | POA: Diagnosis not present

## 2016-09-20 DIAGNOSIS — I69351 Hemiplegia and hemiparesis following cerebral infarction affecting right dominant side: Secondary | ICD-10-CM | POA: Diagnosis not present

## 2016-09-20 DIAGNOSIS — I509 Heart failure, unspecified: Secondary | ICD-10-CM | POA: Diagnosis not present

## 2016-09-20 DIAGNOSIS — I130 Hypertensive heart and chronic kidney disease with heart failure and stage 1 through stage 4 chronic kidney disease, or unspecified chronic kidney disease: Secondary | ICD-10-CM | POA: Diagnosis not present

## 2016-09-20 DIAGNOSIS — Z8701 Personal history of pneumonia (recurrent): Secondary | ICD-10-CM | POA: Diagnosis not present

## 2016-09-20 DIAGNOSIS — M1990 Unspecified osteoarthritis, unspecified site: Secondary | ICD-10-CM | POA: Diagnosis not present

## 2016-09-20 DIAGNOSIS — I69322 Dysarthria following cerebral infarction: Secondary | ICD-10-CM | POA: Diagnosis not present

## 2016-09-21 DIAGNOSIS — I69351 Hemiplegia and hemiparesis following cerebral infarction affecting right dominant side: Secondary | ICD-10-CM | POA: Diagnosis not present

## 2016-09-21 DIAGNOSIS — I130 Hypertensive heart and chronic kidney disease with heart failure and stage 1 through stage 4 chronic kidney disease, or unspecified chronic kidney disease: Secondary | ICD-10-CM | POA: Diagnosis not present

## 2016-09-21 DIAGNOSIS — Z8701 Personal history of pneumonia (recurrent): Secondary | ICD-10-CM | POA: Diagnosis not present

## 2016-09-21 DIAGNOSIS — M1990 Unspecified osteoarthritis, unspecified site: Secondary | ICD-10-CM | POA: Diagnosis not present

## 2016-09-21 DIAGNOSIS — I509 Heart failure, unspecified: Secondary | ICD-10-CM | POA: Diagnosis not present

## 2016-09-21 DIAGNOSIS — I69322 Dysarthria following cerebral infarction: Secondary | ICD-10-CM | POA: Diagnosis not present

## 2016-09-23 DIAGNOSIS — I69322 Dysarthria following cerebral infarction: Secondary | ICD-10-CM | POA: Diagnosis not present

## 2016-09-23 DIAGNOSIS — Z8701 Personal history of pneumonia (recurrent): Secondary | ICD-10-CM | POA: Diagnosis not present

## 2016-09-23 DIAGNOSIS — M1990 Unspecified osteoarthritis, unspecified site: Secondary | ICD-10-CM | POA: Diagnosis not present

## 2016-09-23 DIAGNOSIS — I69351 Hemiplegia and hemiparesis following cerebral infarction affecting right dominant side: Secondary | ICD-10-CM | POA: Diagnosis not present

## 2016-09-23 DIAGNOSIS — I130 Hypertensive heart and chronic kidney disease with heart failure and stage 1 through stage 4 chronic kidney disease, or unspecified chronic kidney disease: Secondary | ICD-10-CM | POA: Diagnosis not present

## 2016-09-23 DIAGNOSIS — I509 Heart failure, unspecified: Secondary | ICD-10-CM | POA: Diagnosis not present

## 2016-09-26 DIAGNOSIS — M1990 Unspecified osteoarthritis, unspecified site: Secondary | ICD-10-CM | POA: Diagnosis not present

## 2016-09-26 DIAGNOSIS — I130 Hypertensive heart and chronic kidney disease with heart failure and stage 1 through stage 4 chronic kidney disease, or unspecified chronic kidney disease: Secondary | ICD-10-CM | POA: Diagnosis not present

## 2016-09-26 DIAGNOSIS — I69351 Hemiplegia and hemiparesis following cerebral infarction affecting right dominant side: Secondary | ICD-10-CM | POA: Diagnosis not present

## 2016-09-26 DIAGNOSIS — Z8701 Personal history of pneumonia (recurrent): Secondary | ICD-10-CM | POA: Diagnosis not present

## 2016-09-26 DIAGNOSIS — I69322 Dysarthria following cerebral infarction: Secondary | ICD-10-CM | POA: Diagnosis not present

## 2016-09-26 DIAGNOSIS — I509 Heart failure, unspecified: Secondary | ICD-10-CM | POA: Diagnosis not present

## 2016-09-28 DIAGNOSIS — I509 Heart failure, unspecified: Secondary | ICD-10-CM | POA: Diagnosis not present

## 2016-09-28 DIAGNOSIS — Z8701 Personal history of pneumonia (recurrent): Secondary | ICD-10-CM | POA: Diagnosis not present

## 2016-09-28 DIAGNOSIS — I69351 Hemiplegia and hemiparesis following cerebral infarction affecting right dominant side: Secondary | ICD-10-CM | POA: Diagnosis not present

## 2016-09-28 DIAGNOSIS — I130 Hypertensive heart and chronic kidney disease with heart failure and stage 1 through stage 4 chronic kidney disease, or unspecified chronic kidney disease: Secondary | ICD-10-CM | POA: Diagnosis not present

## 2016-09-28 DIAGNOSIS — M1990 Unspecified osteoarthritis, unspecified site: Secondary | ICD-10-CM | POA: Diagnosis not present

## 2016-09-28 DIAGNOSIS — I69322 Dysarthria following cerebral infarction: Secondary | ICD-10-CM | POA: Diagnosis not present

## 2016-09-29 DIAGNOSIS — Z8701 Personal history of pneumonia (recurrent): Secondary | ICD-10-CM | POA: Diagnosis not present

## 2016-09-29 DIAGNOSIS — I509 Heart failure, unspecified: Secondary | ICD-10-CM | POA: Diagnosis not present

## 2016-09-29 DIAGNOSIS — M1990 Unspecified osteoarthritis, unspecified site: Secondary | ICD-10-CM | POA: Diagnosis not present

## 2016-09-29 DIAGNOSIS — I69351 Hemiplegia and hemiparesis following cerebral infarction affecting right dominant side: Secondary | ICD-10-CM | POA: Diagnosis not present

## 2016-09-29 DIAGNOSIS — I69322 Dysarthria following cerebral infarction: Secondary | ICD-10-CM | POA: Diagnosis not present

## 2016-09-29 DIAGNOSIS — I130 Hypertensive heart and chronic kidney disease with heart failure and stage 1 through stage 4 chronic kidney disease, or unspecified chronic kidney disease: Secondary | ICD-10-CM | POA: Diagnosis not present

## 2016-10-03 DIAGNOSIS — Z8701 Personal history of pneumonia (recurrent): Secondary | ICD-10-CM | POA: Diagnosis not present

## 2016-10-03 DIAGNOSIS — I69322 Dysarthria following cerebral infarction: Secondary | ICD-10-CM | POA: Diagnosis not present

## 2016-10-03 DIAGNOSIS — M1990 Unspecified osteoarthritis, unspecified site: Secondary | ICD-10-CM | POA: Diagnosis not present

## 2016-10-03 DIAGNOSIS — I130 Hypertensive heart and chronic kidney disease with heart failure and stage 1 through stage 4 chronic kidney disease, or unspecified chronic kidney disease: Secondary | ICD-10-CM | POA: Diagnosis not present

## 2016-10-03 DIAGNOSIS — I69351 Hemiplegia and hemiparesis following cerebral infarction affecting right dominant side: Secondary | ICD-10-CM | POA: Diagnosis not present

## 2016-10-03 DIAGNOSIS — I509 Heart failure, unspecified: Secondary | ICD-10-CM | POA: Diagnosis not present

## 2016-10-04 DIAGNOSIS — H353112 Nonexudative age-related macular degeneration, right eye, intermediate dry stage: Secondary | ICD-10-CM | POA: Diagnosis not present

## 2016-10-04 DIAGNOSIS — H43813 Vitreous degeneration, bilateral: Secondary | ICD-10-CM | POA: Diagnosis not present

## 2016-10-04 DIAGNOSIS — H353221 Exudative age-related macular degeneration, left eye, with active choroidal neovascularization: Secondary | ICD-10-CM | POA: Diagnosis not present

## 2016-10-04 DIAGNOSIS — H35371 Puckering of macula, right eye: Secondary | ICD-10-CM | POA: Diagnosis not present

## 2016-10-17 DIAGNOSIS — J209 Acute bronchitis, unspecified: Secondary | ICD-10-CM | POA: Diagnosis not present

## 2016-10-19 ENCOUNTER — Emergency Department (HOSPITAL_COMMUNITY): Payer: Medicare Other

## 2016-10-19 ENCOUNTER — Observation Stay (HOSPITAL_COMMUNITY): Payer: Medicare Other

## 2016-10-19 ENCOUNTER — Encounter (HOSPITAL_COMMUNITY): Payer: Self-pay | Admitting: *Deleted

## 2016-10-19 ENCOUNTER — Inpatient Hospital Stay (HOSPITAL_COMMUNITY)
Admission: EM | Admit: 2016-10-19 | Discharge: 2016-10-28 | DRG: 551 | Disposition: A | Discharge status: Skilled Nursing Facility | Payer: Medicare Other | Attending: General Surgery | Admitting: General Surgery

## 2016-10-19 DIAGNOSIS — Z8551 Personal history of malignant neoplasm of bladder: Secondary | ICD-10-CM | POA: Diagnosis not present

## 2016-10-19 DIAGNOSIS — H3530 Unspecified macular degeneration: Secondary | ICD-10-CM | POA: Diagnosis present

## 2016-10-19 DIAGNOSIS — M5489 Other dorsalgia: Secondary | ICD-10-CM | POA: Diagnosis not present

## 2016-10-19 DIAGNOSIS — Z85028 Personal history of other malignant neoplasm of stomach: Secondary | ICD-10-CM

## 2016-10-19 DIAGNOSIS — S270XXA Traumatic pneumothorax, initial encounter: Secondary | ICD-10-CM | POA: Diagnosis not present

## 2016-10-19 DIAGNOSIS — Z87891 Personal history of nicotine dependence: Secondary | ICD-10-CM

## 2016-10-19 DIAGNOSIS — W010XXA Fall on same level from slipping, tripping and stumbling without subsequent striking against object, initial encounter: Secondary | ICD-10-CM | POA: Diagnosis present

## 2016-10-19 DIAGNOSIS — S3993XA Unspecified injury of pelvis, initial encounter: Secondary | ICD-10-CM | POA: Diagnosis not present

## 2016-10-19 DIAGNOSIS — Z8711 Personal history of peptic ulcer disease: Secondary | ICD-10-CM

## 2016-10-19 DIAGNOSIS — S22079A Unspecified fracture of T9-T10 vertebra, initial encounter for closed fracture: Secondary | ICD-10-CM | POA: Diagnosis present

## 2016-10-19 DIAGNOSIS — K219 Gastro-esophageal reflux disease without esophagitis: Secondary | ICD-10-CM | POA: Diagnosis present

## 2016-10-19 DIAGNOSIS — W19XXXA Unspecified fall, initial encounter: Secondary | ICD-10-CM

## 2016-10-19 DIAGNOSIS — Z09 Encounter for follow-up examination after completed treatment for conditions other than malignant neoplasm: Secondary | ICD-10-CM

## 2016-10-19 DIAGNOSIS — S2242XA Multiple fractures of ribs, left side, initial encounter for closed fracture: Secondary | ICD-10-CM | POA: Diagnosis present

## 2016-10-19 DIAGNOSIS — Z8572 Personal history of non-Hodgkin lymphomas: Secondary | ICD-10-CM

## 2016-10-19 DIAGNOSIS — Z7901 Long term (current) use of anticoagulants: Secondary | ICD-10-CM

## 2016-10-19 DIAGNOSIS — J939 Pneumothorax, unspecified: Secondary | ICD-10-CM

## 2016-10-19 DIAGNOSIS — R1310 Dysphagia, unspecified: Secondary | ICD-10-CM | POA: Diagnosis not present

## 2016-10-19 DIAGNOSIS — S22059A Unspecified fracture of T5-T6 vertebra, initial encounter for closed fracture: Secondary | ICD-10-CM | POA: Diagnosis not present

## 2016-10-19 DIAGNOSIS — Z79899 Other long term (current) drug therapy: Secondary | ICD-10-CM

## 2016-10-19 DIAGNOSIS — Z96652 Presence of left artificial knee joint: Secondary | ICD-10-CM | POA: Diagnosis present

## 2016-10-19 DIAGNOSIS — J942 Hemothorax: Secondary | ICD-10-CM

## 2016-10-19 DIAGNOSIS — S6992XA Unspecified injury of left wrist, hand and finger(s), initial encounter: Secondary | ICD-10-CM | POA: Diagnosis not present

## 2016-10-19 DIAGNOSIS — Z85828 Personal history of other malignant neoplasm of skin: Secondary | ICD-10-CM

## 2016-10-19 DIAGNOSIS — Z9221 Personal history of antineoplastic chemotherapy: Secondary | ICD-10-CM

## 2016-10-19 DIAGNOSIS — T148XXA Other injury of unspecified body region, initial encounter: Secondary | ICD-10-CM | POA: Diagnosis not present

## 2016-10-19 DIAGNOSIS — T17908A Unspecified foreign body in respiratory tract, part unspecified causing other injury, initial encounter: Secondary | ICD-10-CM

## 2016-10-19 DIAGNOSIS — Z8673 Personal history of transient ischemic attack (TIA), and cerebral infarction without residual deficits: Secondary | ICD-10-CM

## 2016-10-19 DIAGNOSIS — S272XXA Traumatic hemopneumothorax, initial encounter: Secondary | ICD-10-CM | POA: Diagnosis not present

## 2016-10-19 DIAGNOSIS — S62172A Displaced fracture of trapezium [larger multangular], left wrist, initial encounter for closed fracture: Secondary | ICD-10-CM | POA: Diagnosis present

## 2016-10-19 DIAGNOSIS — R52 Pain, unspecified: Secondary | ICD-10-CM

## 2016-10-19 DIAGNOSIS — M549 Dorsalgia, unspecified: Secondary | ICD-10-CM | POA: Diagnosis not present

## 2016-10-19 DIAGNOSIS — I480 Paroxysmal atrial fibrillation: Secondary | ICD-10-CM | POA: Diagnosis present

## 2016-10-19 DIAGNOSIS — S22078A Other fracture of T9-T10 vertebra, initial encounter for closed fracture: Secondary | ICD-10-CM | POA: Diagnosis not present

## 2016-10-19 DIAGNOSIS — S271XXA Traumatic hemothorax, initial encounter: Secondary | ICD-10-CM | POA: Diagnosis not present

## 2016-10-19 DIAGNOSIS — E785 Hyperlipidemia, unspecified: Secondary | ICD-10-CM | POA: Diagnosis present

## 2016-10-19 DIAGNOSIS — Y92009 Unspecified place in unspecified non-institutional (private) residence as the place of occurrence of the external cause: Secondary | ICD-10-CM

## 2016-10-19 DIAGNOSIS — Z8546 Personal history of malignant neoplasm of prostate: Secondary | ICD-10-CM

## 2016-10-19 DIAGNOSIS — R102 Pelvic and perineal pain: Secondary | ICD-10-CM | POA: Diagnosis not present

## 2016-10-19 LAB — CBC
HCT: 37.9 % — ABNORMAL LOW (ref 39.0–52.0)
HEMOGLOBIN: 12 g/dL — AB (ref 13.0–17.0)
MCH: 27.3 pg (ref 26.0–34.0)
MCHC: 31.7 g/dL (ref 30.0–36.0)
MCV: 86.1 fL (ref 78.0–100.0)
Platelets: 158 10*3/uL (ref 150–400)
RBC: 4.4 MIL/uL (ref 4.22–5.81)
RDW: 13.4 % (ref 11.5–15.5)
WBC: 9.3 10*3/uL (ref 4.0–10.5)

## 2016-10-19 LAB — BASIC METABOLIC PANEL
Anion gap: 6 (ref 5–15)
BUN: 24 mg/dL — AB (ref 6–20)
CHLORIDE: 104 mmol/L (ref 101–111)
CO2: 24 mmol/L (ref 22–32)
CREATININE: 1.23 mg/dL (ref 0.61–1.24)
Calcium: 8.8 mg/dL — ABNORMAL LOW (ref 8.9–10.3)
GFR calc Af Amer: 59 mL/min — ABNORMAL LOW (ref >60)
GFR calc non Af Amer: 51 mL/min — ABNORMAL LOW (ref >60)
GLUCOSE: 124 mg/dL — AB (ref 65–99)
POTASSIUM: 4.3 mmol/L (ref 3.5–5.1)
SODIUM: 134 mmol/L — AB (ref 135–145)

## 2016-10-19 LAB — APTT: aPTT: 42 seconds — ABNORMAL HIGH (ref 24–36)

## 2016-10-19 LAB — MRSA PCR SCREENING: MRSA BY PCR: POSITIVE — AB

## 2016-10-19 LAB — PROTIME-INR
INR: 1.35
Prothrombin Time: 16.8 seconds — ABNORMAL HIGH (ref 11.4–15.2)

## 2016-10-19 LAB — I-STAT TROPONIN, ED: TROPONIN I, POC: 0.04 ng/mL (ref 0.00–0.08)

## 2016-10-19 MED ORDER — TRAZODONE HCL 50 MG PO TABS
50.0000 mg | ORAL_TABLET | Freq: Every evening | ORAL | Status: DC | PRN
  Administered 2016-10-22 – 2016-10-25 (×4): 50 mg via ORAL
  Filled 2016-10-19 (×5): qty 1

## 2016-10-19 MED ORDER — ONDANSETRON HCL 4 MG/2ML IJ SOLN
4.0000 mg | Freq: Four times a day (QID) | INTRAMUSCULAR | Status: DC | PRN
  Administered 2016-10-19: 4 mg via INTRAVENOUS
  Filled 2016-10-19: qty 2

## 2016-10-19 MED ORDER — ONDANSETRON 4 MG PO TBDP: 4.0000 mg | ORAL_TABLET | Freq: Four times a day (QID) | ORAL | Status: DC | PRN

## 2016-10-19 MED ORDER — DOCUSATE SODIUM 100 MG PO CAPS
100.0000 mg | ORAL_CAPSULE | Freq: Every day | ORAL | Status: DC
  Administered 2016-10-20 – 2016-10-22 (×3): 100 mg via ORAL
  Filled 2016-10-19 (×3): qty 1

## 2016-10-19 MED ORDER — SENNA 8.6 MG PO TABS
1.0000 | ORAL_TABLET | Freq: Every day | ORAL | Status: DC
  Administered 2016-10-21 – 2016-10-26 (×6): 8.6 mg via ORAL
  Filled 2016-10-19 (×7): qty 1

## 2016-10-19 MED ORDER — IPRATROPIUM-ALBUTEROL 0.5-2.5 (3) MG/3ML IN SOLN: 3.0000 mL | RESPIRATORY_TRACT | Status: DC | PRN

## 2016-10-19 MED ORDER — KCL IN DEXTROSE-NACL 20-5-0.45 MEQ/L-%-% IV SOLN
INTRAVENOUS | Status: DC
  Administered 2016-10-19 – 2016-10-21 (×4): via INTRAVENOUS
  Administered 2016-10-22: 50 mL/h via INTRAVENOUS
  Administered 2016-10-24 – 2016-10-26 (×4): via INTRAVENOUS
  Filled 2016-10-19 (×10): qty 1000

## 2016-10-19 MED ORDER — ACETAMINOPHEN 325 MG PO TABS
650.0000 mg | ORAL_TABLET | Freq: Once | ORAL | Status: AC
  Administered 2016-10-19: 650 mg via ORAL
  Filled 2016-10-19: qty 2

## 2016-10-19 MED ORDER — MORPHINE SULFATE (PF) 4 MG/ML IV SOLN
2.0000 mg | INTRAVENOUS | Status: DC | PRN
  Administered 2016-10-19 – 2016-10-20 (×3): 4 mg via INTRAVENOUS
  Filled 2016-10-19 (×4): qty 1

## 2016-10-19 MED ORDER — AMOXICILLIN 500 MG PO CAPS
500.0000 mg | ORAL_CAPSULE | Freq: Two times a day (BID) | ORAL | Status: DC
  Administered 2016-10-19 – 2016-10-22 (×7): 500 mg via ORAL
  Filled 2016-10-19 (×8): qty 1

## 2016-10-19 MED ORDER — BENZONATATE 100 MG PO CAPS
100.0000 mg | ORAL_CAPSULE | Freq: Three times a day (TID) | ORAL | Status: DC | PRN
  Filled 2016-10-19: qty 1

## 2016-10-19 MED ORDER — OXYCODONE HCL 5 MG PO TABS
5.0000 mg | ORAL_TABLET | ORAL | Status: DC | PRN
  Administered 2016-10-20: 5 mg via ORAL
  Administered 2016-10-20: 10 mg via ORAL
  Administered 2016-10-25: 5 mg via ORAL
  Filled 2016-10-19: qty 1
  Filled 2016-10-19 (×2): qty 2
  Filled 2016-10-19: qty 1
  Filled 2016-10-19: qty 2

## 2016-10-19 MED ORDER — SIMVASTATIN 40 MG PO TABS
40.0000 mg | ORAL_TABLET | Freq: Every day | ORAL | Status: DC
  Administered 2016-10-20 – 2016-10-26 (×7): 40 mg via ORAL
  Filled 2016-10-19 (×7): qty 1

## 2016-10-19 MED ORDER — BOOST / RESOURCE BREEZE PO LIQD: 1.0000 | Freq: Three times a day (TID) | ORAL | Status: DC

## 2016-10-19 MED ORDER — PANTOPRAZOLE SODIUM 40 MG IV SOLR
40.0000 mg | Freq: Every day | INTRAVENOUS | Status: DC
  Administered 2016-10-20 – 2016-10-25 (×5): 40 mg via INTRAVENOUS
  Filled 2016-10-19 (×7): qty 40

## 2016-10-19 MED ORDER — HYDRALAZINE HCL 20 MG/ML IJ SOLN: 10.0000 mg | INTRAMUSCULAR | Status: DC | PRN

## 2016-10-19 MED ORDER — CARBIDOPA-LEVODOPA 25-100 MG PO TABS
1.0000 | ORAL_TABLET | Freq: Three times a day (TID) | ORAL | Status: DC
  Administered 2016-10-19 – 2016-10-22 (×10): 1 via ORAL
  Filled 2016-10-19 (×11): qty 1

## 2016-10-19 MED ORDER — ACETAMINOPHEN 325 MG PO TABS
650.0000 mg | ORAL_TABLET | Freq: Four times a day (QID) | ORAL | Status: DC | PRN
  Administered 2016-10-20: 650 mg via ORAL
  Filled 2016-10-19 (×2): qty 2

## 2016-10-19 MED ORDER — TAMSULOSIN HCL 0.4 MG PO CAPS
0.4000 mg | ORAL_CAPSULE | Freq: Every day | ORAL | Status: DC
  Administered 2016-10-19 – 2016-10-26 (×8): 0.4 mg via ORAL
  Filled 2016-10-19 (×8): qty 1

## 2016-10-19 MED ORDER — POLYETHYL GLYCOL-PROPYL GLYCOL 0.4-0.3 % OP SOLN: 1.0000 [drp] | Freq: Every day | OPHTHALMIC | Status: DC | PRN

## 2016-10-19 MED ORDER — POLYVINYL ALCOHOL 1.4 % OP SOLN
1.0000 [drp] | OPHTHALMIC | Status: DC | PRN
  Filled 2016-10-19: qty 15

## 2016-10-19 MED ORDER — PANTOPRAZOLE SODIUM 40 MG PO TBEC
40.0000 mg | DELAYED_RELEASE_TABLET | Freq: Every day | ORAL | Status: DC
  Administered 2016-10-22 – 2016-10-26 (×2): 40 mg via ORAL
  Filled 2016-10-19 (×2): qty 1

## 2016-10-19 NOTE — ED Triage Notes (Signed)
Patient states he was walking with a walker lost his balance and fell backwards hitting his back on the corner of a dresser. Bruising with abrasion to thoracic area. . No bleeding. C/o anterior chest pain at present. Congested cough states he has been on antibiotics x 3 days . For resp infection. Alert oriented denies hitting his head.

## 2016-10-19 NOTE — ED Notes (Signed)
Patient c/o left wrist hurting states his arm was behind him when he fell.bruising to left wrist. Positive left radial pulse

## 2016-10-19 NOTE — ED Notes (Signed)
Patient transported to x-ray. ?

## 2016-10-19 NOTE — Progress Notes (Signed)
Patient ID: Jason [name], male   DOB: March 22, 1928, 83 y.o.   MRN: [id number]  83 year old s/p fall this morning I have seen and examined the patient and agree with the assessment and plans as outlined by our PA  He has multiple rib fractures with a small pneumothorax and tiny hemothorax.  We are asking for neurosurg opinion and had surg opinion regarding the xray findings.  I suspect both are no operative.  Biggest issues are his co-morbidities with the chest injury. Will check a repeat CXR and pelvis Xray.  He has no sign of head trauma or abdominal trauma.  The chest CT made it through the liver and the spleen which looked normal. Admitting to the ICU for close pulmonary monitoring.  If pneumothorax or hemothorax increase, he will need chest tube insertion.  Shaina Gullatt A. Ninfa Linden  MD, FACS

## 2016-10-19 NOTE — ED Provider Notes (Signed)
[facility] DEPT Provider Note   CSN: [id number] Arrival date & time: 10/19/16  1203     History   Chief Complaint Chief Complaint  Patient presents with  . Fall    HPI Jason [name] is a 83 y.o. male.  HPI  83 y.o. male with a hx of PAF on Eliquis, Parkinsons, presents to the Emergency Department today due to mechanical fall PTA. Pt states that he was ambulating with his walker and lost his balance and fell backwards. Notes not head trauma or LOC. States he landed flat on his back and struck the corner of a cabinet on his way down. Rates pain 8/10. Worse with palpation and inspiration. No N/V. No abdominal pain. Notes back pain with radiation into chest. No diaphoresis. No fevers. Notes cough for several days and saw PCP on Monday. Given Amoxicillin and Tessalon. Pt is on Eliquis for PAF. No other symptoms noted.   Past Medical History:  Diagnosis Date  . Arthritis   . Barrett esophagus   . Bladder cancer ([place]) dx'd 0349,1791   surg only  . Colon polyps    adenomatous  . Difficulty sleeping    OCCASIONALLY  . Diverticulosis of colon (without mention of hemorrhage)   . Frequency of urination   . GERD (gastroesophageal reflux disease)   . Hiatal hernia   . History of bladder cancer 1981   EXCISION ONLY  . History of skin cancer    BASAL CELL  . Hyperlipidemia   . Lymphoma, non-Hodgkin's ([place]) 2011   [place]   . Macular degeneration    LEFT EYE  . Multiple nodules of lung 09/16/2011  . PAF (paroxysmal atrial fibrillation) ([place])   . Personal history of colonic polyps 07/15/2010   tubular adenoma  . Prostate cancer [facility])    patient denies  . PUD (peptic ulcer disease)   . Stomach cancer ([place])    NHL origin  . Stroke ([place])   . Weakness     Patient Active Problem List   Diagnosis Date Noted  . Gait abnormality 09/05/2016  . Parkinsonism ([place]) 09/05/2016  . CVA (cerebral vascular accident) ([place]) 07/01/2016  . Dysarthria   . History of cancer   . TIA  (transient ischemic attack) 06/30/2016  . Hemiplegia affecting dominant side ([place]) 06/30/2016  . History of bladder cancer   . Dysphagia   . Hyperglycemia   . Stage 3 chronic kidney disease   . Acute blood loss anemia   . Leukocytosis   . Weakness   . Community acquired pneumonia 06/16/2016  . Unresponsiveness   . CAP (community acquired pneumonia) 06/15/2016  . PAF (paroxysmal atrial fibrillation) ([place]) 06/15/2016  . Pressure injury of skin 06/15/2016  . Lobar pneumonia ([place])   . Acute congestive heart failure ([place])   . Primary osteoarthritis of left knee 07/30/2015  . BPH (benign prostatic hyperplasia) 07/16/2012  . Multiple nodules of lung 09/16/2011  . GERD (gastroesophageal reflux disease) 06/14/2011  . Constipation, chronic 06/14/2011  . NHL (non-Hodgkin's lymphoma) ([place]) 01/03/2010  . Malignant neoplasm of bladder ([place]) 05/28/2007  . Hyperlipidemia 05/28/2007  . HIATAL HERNIA 05/28/2007  . BARRETT'S ESOPHAGUS, HX OF 05/28/2007    Past Surgical History:  Procedure Laterality Date  . APPENDECTOMY    . bone spur     left shoulder  . CHOLECYSTECTOMY    . KNEE ARTHROPLASTY Left 07/30/2015   Procedure: LEFT TOTAL KNEE ARTHROPLASTY WITH COMPUTER NAVIGATION;  Surgeon: Rod Can, MD;  Location: [facility] ORS;  Service: Orthopedics;  Laterality: Left;  . LASER OF PROSTATE W/ GREEN LIGHT PVP    . TENDON REPAIR     right arm  . TRIGGER FINGER RELEASE     bilateral hands  . WRIST SURGERY     Bilateral       Home Medications    Prior to Admission medications   Medication Sig Start Date End Date Taking? Authorizing Provider  apixaban (ELIQUIS) 5 MG TABS tablet Take 5 mg by mouth 2 (two) times daily.    [provider]  carbidopa-levodopa (SINEMET IR) 25-100 MG tablet Take 1 tablet by mouth 3 (three) times daily. 09/05/16   Marcial Pacas, MD  Docusate Sodium (COLACE PO) Take by mouth as needed.    [provider]  esomeprazole (NEXIUM) 40 MG capsule TAKE 1  CAPSULE (40 MG TOTAL) BY MOUTH 2 (TWO) TIMES DAILY BEFORE A MEAL. 03/17/16   Irene Shipper, MD  fish oil-omega-3 fatty acids 1000 MG capsule Take 1 g by mouth 2 (two) times daily.     [provider]  Multiple Vitamins-Minerals (ICAPS MV PO) Take 1 capsule by mouth 2 (two) times daily.     [provider]  Polyethyl Glycol-Propyl Glycol (SYSTANE) 0.4-0.3 % SOLN Place 1 drop into both eyes daily as needed (for dry eyes).     [provider]  Sennosides (SENOKOT PO) Take by mouth as needed.    [provider]  simvastatin (ZOCOR) 40 MG tablet Take 40 mg by mouth at bedtime.      [provider]  tamsulosin (FLOMAX) 0.4 MG CAPS capsule Take 0.4 mg by mouth at bedtime.    [provider]  traZODone (DESYREL) 50 MG tablet Take 50 mg by mouth at bedtime as needed for sleep.    [provider]    Family History Family History  Problem Relation Age of Onset  . Heart attack Father   . Other Mother        "old age"    Social History Social History  Substance Use Topics  . Smoking status: Former Smoker    Quit date: 10/29/1966  . Smokeless tobacco: Former Systems developer    Quit date: 06/29/1998  . Alcohol use Yes     Comment: Socially     Allergies   Patient has no known allergies.   Review of Systems Review of Systems ROS reviewed and all are negative for acute change except as noted in the HPI.  Physical Exam Updated Vital Signs BP (!) 173/76 (BP Location: Left Arm)   Pulse 85   Temp 97.8 F (36.6 C)   Resp (!) 22   Ht 5\' 10"  (1.778 m)   Wt 100.7 kg (222 lb)   SpO2 94%   BMI 31.85 kg/m   Physical Exam  Constitutional: He is oriented to person, place, and time. He appears well-developed and well-nourished. No distress.  HENT:  Head: Normocephalic and atraumatic.  Right Ear: Tympanic membrane, external ear and ear canal normal.  Left Ear: Tympanic membrane, external ear and ear canal normal.  Nose: Nose normal.    Mouth/Throat: Uvula is midline, oropharynx is clear and moist and mucous membranes are normal. No trismus in the jaw. No oropharyngeal exudate, posterior oropharyngeal erythema or tonsillar abscesses.  Eyes: Pupils are equal, round, and reactive to light. EOM are normal.  Neck: Normal range of motion. Neck supple. No tracheal deviation present.  Cardiovascular: Normal rate, regular rhythm, S1 normal, S2 normal, normal heart  sounds, intact distal pulses and normal pulses.   Pulmonary/Chest: Effort normal. No respiratory distress. He has decreased breath sounds in the right upper field and the left upper field. He has no wheezes. He has rhonchi in the right upper field, the right lower field and the left lower field. He has no rales. He exhibits tenderness.  TTP along anterior chest wall bilaterally. No palpable or visible deformities   Abdominal: Normal appearance and bowel sounds are normal. There is no tenderness.  Musculoskeletal: Normal range of motion.  TTP along thoracic spine. No palpable or visible deformities. Small superficial abrasion lateral to thoracic spine on left.   Neurological: He is alert and oriented to person, place, and time. He has normal strength. No cranial nerve deficit or sensory deficit.  Cranial Nerves:  II: Pupils equal, round, reactive to light III,IV, VI: ptosis not present, extra-ocular motions intact bilaterally  V,VII: smile symmetric, facial light touch sensation equal VIII: hearing grossly normal bilaterally  IX,X: midline uvula rise  XI: bilateral shoulder shrug equal and strong XII: midline tongue extension  Skin: Skin is warm and dry.  Psychiatric: He has a normal mood and affect. His speech is normal and behavior is normal. Thought content normal.  Nursing note and vitals reviewed.  ED Treatments / Results  Labs (all labs ordered are listed, but only abnormal results are displayed) Labs Reviewed  CBC - Abnormal; Notable for the following:        Result Value   Hemoglobin 12.0 (*)    HCT 37.9 (*)    All other components within normal limits  BASIC METABOLIC PANEL - Abnormal; Notable for the following:    Sodium 134 (*)    Glucose, Bld 124 (*)    BUN 24 (*)    Calcium 8.8 (*)    GFR calc non Af Amer 51 (*)    GFR calc Af Amer 59 (*)    All other components within normal limits  I-STAT TROPOININ, ED    EKG  EKG Interpretation None       Radiology Dg Wrist Complete Left  Result Date: 10/19/2016 CLINICAL DATA:  Fall. EXAM: LEFT WRIST - COMPLETE 3+ VIEW COMPARISON:  No recent prior. FINDINGS: Diffuse degenerative change. Degenerative changes most prominent about the first carpometacarpal joint. Fracture of the trapezium cannot be excluded. Changes present may chest be degenerative. IMPRESSION: 1. Diffuse degenerative change. Degenerative changes most prominent about the first carpometacarpal joint. 2.  Fracture of the trapezium cannot be excluded . Electronically Signed   By: Marcello Moores  Register   On: 10/19/2016 13:27   Ct Chest Wo Contrast  Result Date: 10/19/2016 CLINICAL DATA:  Pt was walking with a walker, fell and hit his thoracic spine on the corner of a dresser. Pt has bruising and abrasion to same. Pt is presently taking antibiotics for a respiratory infection, with anterior chest pain, congested cough EXAM: CT CHEST WITHOUT CONTRAST TECHNIQUE: Multidetector CT imaging of the chest was performed following the standard protocol without IV contrast. COMPARISON:  07/01/2016 FINDINGS: Cardiovascular: No significant vascular findings. Normal heart size. No pericardial effusion. Coronary artery atherosclerosis involving the left main, circumflex coronary artery is. Abdominal aortic atherosclerosis. Mediastinum/Nodes: Trachea, esophagus and thyroid gland are normal. No axillary lymphadenopathy. Enlarged subcarinal lymph node measuring 15 mm in short axis. Mildly enlarged lower paratracheal lymph node measuring 10 mm in short axis. Left  hilar lymph node measuring 11 mm in short axis. Lungs/Pleura: Small left pneumothorax measuring less than 10%. No right  pneumothorax. Small left hemothorax. Bilateral lower lobe chronic interstitial lung disease. Superimposed bilateral lower lobe airspace disease likely reflecting atelectasis. Upper Abdomen: No acute abnormality. Musculoskeletal: Comminuted fracture of the left posterior eleventh rib at the costovertebral junction. Comminuted fracture of the left posterior tenth rib and adjacent left T10 transverse process. Comminuted fracture of the left posterior tenth rib and adjacent left T9 transverse process. Nondisplaced fracture of the left inferior articulating facet of T6. IMPRESSION: 1. Small left pneumothorax measuring less than 10%. 2. Small left hemothorax. 3. Comminuted fracture of the left posterior eleventh rib at the costovertebral junction. Comminuted fracture of the left posterior tenth rib and adjacent left T10 transverse process. Comminuted fracture of the left posterior tenth rib and adjacent left T9 transverse process. 4. Nondisplaced fracture of the left inferior articulating facet of T6. 5. Bilateral lower lobe chronic interstitial disease with superimposed atelectasis. 6. Aortic Atherosclerosis (ICD10-I70.0). Critical Value/emergent results were called by telephone at the time of interpretation on 10/19/2016 at 3:13 pm to [facility] , who verbally acknowledged these results. Electronically Signed   By: Kathreen Devoid   On: 10/19/2016 15:13    Procedures Procedures (including critical care time)  Medications Ordered in ED Medications  acetaminophen (TYLENOL) tablet 650 mg (650 mg Oral Given 10/19/16 1253)   Initial Impression / Assessment and Plan / ED Course  I have reviewed the triage vital signs and the nursing notes.  Pertinent labs & imaging results that were available during my care of the patient were reviewed by me and considered in my medical decision making (see chart for  details).  Final Clinical Impressions(s) / ED Diagnoses  {I have reviewed and evaluated the relevant laboratory values. {I have reviewed and evaluated the relevant imaging studies. {I have interpreted the relevant EKG. {I have reviewed the relevant previous healthcare records.  {I obtained HPI from historian. {Patient discussed with supervising physician.  ED Course:  Assessment: Pt is a 83 y.o. male with a hx of PAF on Eliquis, Parkinsons, presents to the Emergency Department today due to mechanical fall PTA. Pt states that he was ambulating with his walker and lost his balance and fell backwards. Notes not head trauma or LOC. States he landed flat on his back and struck the corner of a cabinet on his way down. Rates pain 8/10. Worse with palpation and inspiration. No N/V. No abdominal pain. Notes back pain with radiation into chest. No diaphoresis. No fevers. Notes cough for several days and saw PCP on Monday. Given Amoxicillin and Tessalon. Pt is on Eliquis for PAF. No other symptoms noted.   On exam, pt in NAD. Nontoxic/nonseptic appearing. VSS. Afebrile. Lungs CTA. Heart RRR. Abdomen nontender soft. TTP along thoracic spine. No palpable or visible deformities. Small abrasion to left of thoracic spine.  Non TTP along C/L spine. Mild TTP along anterior chest wall. Labs unremarkable. CT Chest/Thoracic Spine pending. Given analgesia in ED.   3:12 PM- Received call from Radiology. CT Chest results as follows: IMPRESSION:  1. Small left pneumothorax measuring less than 10%.  2. Small left hemothorax.  3. Comminuted fracture of the left posterior eleventh rib at the  costovertebral junction. Comminuted fracture of the left posterior  tenth rib and adjacent left T10 transverse process. Comminuted  fracture of the left posterior tenth rib and adjacent left T9  transverse process.  4. Nondisplaced fracture of the left inferior articulating facet of  T6.   DG left Wrist with possible Trapezium  fracture. Placed in splint.  Consult to Trauma. Will admit   Disposition/Plan:  Admit Pt acknowledges and agrees with plan  Supervising Physician Mackuen, Courteney Lyn, *  Final diagnoses:  Traumatic pneumothorax, initial encounter  Hemothorax on left  Closed fracture of multiple ribs of left side, initial encounter    New Prescriptions New Prescriptions   No medications on file     Shary Decamp, Hershal Coria 10/19/16 1527    Macarthur Critchley, MD 10/20/16 [phone]

## 2016-10-19 NOTE — H&P (Signed)
Subjective: Patient is a 83 y.o. male who complains of back pain. Onset of symptoms was today after a fall at home   approximately 7 hours ago, stable since that time.  Onset was related to a fall. The pain is rated severe, and is located at the mid back. The pain is described as aching and soreness and occurs all day. The symptoms denies been progressive. Symptoms are exacerbated by coughing and deep breathing. The patient has tried tylenol with little relief. Patients daughter stated that he was walking with his walker in his house, tripped and fell backwards onto the corner of a hutch and then to the floor. She states that he has fallen one other time recently when he tripped over his walker. CT showed Left T9 and T10 mildly displaced transverse process fractures  Past Medical History:  Diagnosis Date  . Arthritis   . Barrett esophagus   . Bladder cancer ([place]) dx'd [id number]   surg only  . Colon polyps    adenomatous  . Difficulty sleeping    OCCASIONALLY  . Diverticulosis of colon (without mention of hemorrhage)   . Frequency of urination   . GERD (gastroesophageal reflux disease)   . Hiatal hernia   . History of bladder cancer 1981   EXCISION ONLY  . History of skin cancer    BASAL CELL  . Hyperlipidemia   . Lymphoma, non-Hodgkin's ([place]) 2011   [place]   . Macular degeneration    LEFT EYE  . Multiple nodules of lung 09/16/2011  . PAF (paroxysmal atrial fibrillation) ([place])   . Personal history of colonic polyps 07/15/2010   tubular adenoma  . Prostate cancer [facility])    patient denies  . PUD (peptic ulcer disease)   . Stomach cancer ([place])    NHL origin  . Stroke ([place])   . Weakness     Past Surgical History:  Procedure Laterality Date  . APPENDECTOMY    . bone spur     left shoulder  . CHOLECYSTECTOMY    . KNEE ARTHROPLASTY Left 07/30/2015   Procedure: LEFT TOTAL KNEE ARTHROPLASTY WITH COMPUTER NAVIGATION;  Surgeon: Rod Can, MD;  Location: [facility] ORS;  Service:  Orthopedics;  Laterality: Left;  . LASER OF PROSTATE W/ GREEN LIGHT PVP    . TENDON REPAIR     right arm  . TRIGGER FINGER RELEASE     bilateral hands  . WRIST SURGERY     Bilateral    No Known Allergies  Social History  Substance Use Topics  . Smoking status: Former Smoker    Quit date: 10/29/1966  . Smokeless tobacco: Former Systems developer    Quit date: 06/29/1998  . Alcohol use Yes     Comment: Socially    Family History  Problem Relation Age of Onset  . Heart attack Father   . Other Mother        "old age"   Prior to Admission medications   Medication Sig Start Date End Date Taking? Authorizing Provider  amoxicillin (AMOXIL) 875 MG tablet Take 875 mg by mouth every 12 (twelve) hours. 10/17/16  Yes [provider]  apixaban (ELIQUIS) 5 MG TABS tablet Take 5 mg by mouth 2 (two) times daily.   Yes [provider]  benzonatate (TESSALON) 100 MG capsule Take 100 mg by mouth every 8 (eight) hours as needed for cough. 10/17/16  Yes [provider]  carbidopa-levodopa (SINEMET IR) 25-100 MG tablet Take 1 tablet by mouth 3 (three)  times daily. 09/05/16  Yes Marcial Pacas, MD  Docusate Sodium (COLACE PO) Take 100 mg by mouth at bedtime.    Yes [provider]  esomeprazole (NEXIUM) 40 MG capsule TAKE 1 CAPSULE (40 MG TOTAL) BY MOUTH 2 (TWO) TIMES DAILY BEFORE A MEAL. 03/17/16  Yes Irene Shipper, MD  fish oil-omega-3 fatty acids 1000 MG capsule Take 1 g by mouth 2 (two) times daily.    Yes [provider]  Multiple Vitamins-Minerals (ICAPS MV PO) Take 1 capsule by mouth 2 (two) times daily.    Yes [provider]  Polyethyl Glycol-Propyl Glycol (SYSTANE) 0.4-0.3 % SOLN Place 1 drop into both eyes daily as needed (for dry eyes).    Yes [provider]  senna (SENOKOT) 8.6 MG tablet Take 1 tablet by mouth daily.   Yes [provider]  simvastatin (ZOCOR) 40 MG tablet Take 40 mg by mouth at bedtime.     Yes [provider]   tamsulosin (FLOMAX) 0.4 MG CAPS capsule Take 0.4 mg by mouth at bedtime.   Yes [provider]  traZODone (DESYREL) 50 MG tablet Take 50 mg by mouth at bedtime as needed for sleep.   Yes [provider]     Review of Systems  Positive ROS:   All other systems have been reviewed and were otherwise negative with the exception of those mentioned in the HPI and as above.  Objective: Vital signs in last 24 hours: Temp:  [97.8 F (36.6 C)] 97.8 F (36.6 C) (07/18 1212) Pulse Rate:  [77-85] 80 (07/18 1500) Resp:  [20-22] 21 (07/18 1500) BP: (139-173)/(68-76) 139/68 (07/18 1500) SpO2:  [94 %-98 %] 98 % (07/18 1500) Weight:  [100.7 kg (222 lb)] 100.7 kg (222 lb) (07/18 1213)  General Appearance: Alert, cooperative, no distress, appears stated age Head: Normocephalic, without obvious abnormality, atraumatic Eyes:  EOM's intact Ears: Not tested Throat: not assessed Neck: not tested Back: Symmetric, no curvature, ROM normal Lungs: respirations unlabored Heart: Regular rate and rhythm Abdomen: not assessed Extremities: Extremities normal, atraumatic, no cyanosis or edema Pulses: NA Skin: Skin color, texture normal, no rashes or lesions  NEUROLOGIC:   Mental status: alert and oriented, no aphasia, good attention span, Fund of knowledge/ memory ok Motor Exam - grossly normal Sensory Exam - grossly normal Reflexes:  Coordination - grossly normal Gait - not tested Balance - not tested Cranial Nerves: I: smell Not tested  II: visual acuity  OS: Na    OD: Na  II: visual fields Full to confrontation  II: pupils Not tested  III,VII: ptosis None  III,IV,VI: extraocular muscles  Full ROM  V: mastication Not tested  V: facial light touch sensation  Not tested  V,VII: corneal reflex  Not tested  VII: facial muscle function - upper  Not tested  VII: facial muscle function - lower Not tested  VIII: hearing Not tested  IX: soft palate elevation  Not tested  IX,X: gag  reflex Not tested  XI: trapezius strength  5/5  XI: sternocleidomastoid strength NA  XI: neck flexion strength  NA  XII: tongue strength  NA    Data Review Lab Results  Component Value Date   WBC 9.3 10/19/2016   HGB 12.0 (L) 10/19/2016   HCT 37.9 (L) 10/19/2016   MCV 86.1 10/19/2016   PLT 158 10/19/2016   Lab Results  Component Value Date   NA 134 (L) 10/19/2016   K 4.3 10/19/2016   CL 104 10/19/2016  CO2 24 10/19/2016   BUN 24 (H) 10/19/2016   CREATININE 1.23 10/19/2016   GLUCOSE 124 (H) 10/19/2016   Lab Results  Component Value Date   INR 1.58 06/30/2016    Assessment/Plan: Trauma to admit him. He is stable and doing well. Pain management for his back and rib fractures. No brace needed at this time. His fractures are not unstable and I believe this will heal on its own with time. Will follow up with him in the office with images to assess healing.    Ocie Cornfield Meyran 10/19/2016 4:46 PM   Attending note: Agree with above,  if pain becomes a difficult issue to control we can get him a thoracic brace however this may aggravate his rib fractures possibly even a thoracic binder may be more helpful for him.

## 2016-10-19 NOTE — ED Notes (Signed)
Pt returns from radiology. Daughter has left the room.

## 2016-10-19 NOTE — Progress Notes (Signed)
Orthopedic Tech Progress Note Patient Details:  Jason [name] 06-05-1927 [id number]  Ortho Devices Type of Ortho Device: Ace wrap, Volar splint, Thumb spica splint Ortho Device/Splint Location: LUE   Braulio Bosch 10/19/2016, 3:47 PM

## 2016-10-19 NOTE — H&P (Signed)
[facility] Surgery Admission Note  LEGACY LACIVITA 1928/01/29  [id number].    Requesting MD: Shary Decamp PA-C Chief Complaint/Reason for Consult: Fall with rib fractures, ptx, and thoracic spine fractures HPI:  Patient is an 83 yo male who fell at home this morning while using his walker. He fell backwards into a piece of furniture and immediately felt pain in his left back and ribs. Patient is unsure of why he fell, thinks he may have just lost his footing. Patient denies hitting his head, LOC, dizziness, numbness/tingling, SOB, palpitations, blurred vision, abdominal pain, nausea, vomiting.  Patient also having pain in left hand over thumb, able to bend and grip still.  Patient does take Eliquis, last dose was this AM. Workup in the ED showed fractures of 9-11 left ribs and small left hemopneumothorax, and T9 and T10 TP fractures, also non-displaced fx of T6 facet.   ROS: Review of Systems  Constitutional: Negative for chills, fever and malaise/fatigue.  HENT: Negative for nosebleeds, sore throat and tinnitus.   Eyes: Negative for blurred vision and double vision.  Respiratory: Positive for cough and wheezing. Negative for shortness of breath.   Cardiovascular: Negative for chest pain and palpitations.  Gastrointestinal: Negative for abdominal pain, blood in stool, diarrhea, nausea and vomiting.  Musculoskeletal: Positive for back pain and falls. Negative for neck pain.  Neurological: Negative for dizziness, tingling, sensory change, focal weakness, loss of consciousness and headaches.  All other systems reviewed and are negative.   Family History  Problem Relation Age of Onset  . Heart attack Father   . Other Mother        "old age"    Past Medical History:  Diagnosis Date  . Arthritis   . Barrett esophagus   . Bladder cancer ([place]) dx'd 9629,5284   surg only  . Colon polyps    adenomatous  . Difficulty sleeping    OCCASIONALLY  . Diverticulosis of colon (without  mention of hemorrhage)   . Frequency of urination   . GERD (gastroesophageal reflux disease)   . Hiatal hernia   . History of bladder cancer 1981   EXCISION ONLY  . History of skin cancer    BASAL CELL  . Hyperlipidemia   . Lymphoma, non-Hodgkin's ([place]) 2011   [place]   . Macular degeneration    LEFT EYE  . Multiple nodules of lung 09/16/2011  . PAF (paroxysmal atrial fibrillation) ([place])   . Personal history of colonic polyps 07/15/2010   tubular adenoma  . Prostate cancer [facility])    patient denies  . PUD (peptic ulcer disease)   . Stomach cancer ([place])    NHL origin  . Stroke ([place])   . Weakness     Past Surgical History:  Procedure Laterality Date  . APPENDECTOMY    . bone spur     left shoulder  . CHOLECYSTECTOMY    . KNEE ARTHROPLASTY Left 07/30/2015   Procedure: LEFT TOTAL KNEE ARTHROPLASTY WITH COMPUTER NAVIGATION;  Surgeon: Rod Can, MD;  Location: [facility] ORS;  Service: Orthopedics;  Laterality: Left;  . LASER OF PROSTATE W/ GREEN LIGHT PVP    . TENDON REPAIR     right arm  . TRIGGER FINGER RELEASE     bilateral hands  . WRIST SURGERY     Bilateral    Social History:  reports that he quit smoking about 50 years ago. He quit smokeless tobacco use about 18 years ago. He reports that he drinks alcohol. He  reports that he does not use drugs.  Allergies: No Known Allergies   (Not in a hospital admission)  Blood pressure 139/68, pulse 80, temperature 97.8 F (36.6 C), resp. rate (!) 21, height '5\' 10"'  (1.778 m), weight 100.7 kg (222 lb), SpO2 98 %. Physical Exam: Physical Exam  Constitutional: He is oriented to person, place, and time. He appears well-developed and well-nourished. He is cooperative.  Non-toxic appearance. No distress.  HENT:  Head: Normocephalic and atraumatic.  Right Ear: External ear normal.  Left Ear: External ear normal.  Nose: Nose normal.  Mouth/Throat: Oropharynx is clear and moist and mucous membranes are normal.  Eyes: Pupils are  equal, round, and reactive to light. Conjunctivae, EOM and lids are normal. No scleral icterus.  Neck: Trachea normal and normal range of motion. Neck supple. No spinous process tenderness and no muscular tenderness present. No tracheal deviation present.  Cardiovascular: Normal rate, S1 normal and S2 normal.  An irregularly irregular rhythm present.  Pulses:      Radial pulses are 2+ on the right side, and 2+ on the left side.       Dorsalis pedis pulses are 2+ on the right side, and 2+ on the left side.  Pulmonary/Chest: No accessory muscle usage or stridor. Tachypnea (mildly) noted. He has rhonchi in the right upper field, the right middle field and the left upper field. He exhibits tenderness. He exhibits no laceration, no crepitus, no edema and no retraction.  Lungs sound junky. TTP of left posterior ribs.  Abdominal: Soft. Normal appearance and bowel sounds are normal. He exhibits no distension and no mass. There is no hepatosplenomegaly. There is no tenderness. There is no rigidity, no rebound and no guarding. No hernia.  Musculoskeletal: Normal range of motion.       Right shoulder: Normal.       Left shoulder: Normal.       Cervical back: He exhibits no bony tenderness, no swelling, no edema and no deformity.       Thoracic back: He exhibits bony tenderness. He exhibits no swelling, no edema, no deformity and no laceration.       Lumbar back: He exhibits no tenderness, no bony tenderness, no swelling, no edema and no deformity.       Right hand: Normal.       Left hand: He exhibits bony tenderness (Over base of thumb). He exhibits normal range of motion, normal capillary refill, no laceration and no swelling.  Neurological: He is alert and oriented to person, place, and time. He has normal strength. No sensory deficit. GCS eye subscore is 4. GCS verbal subscore is 5. GCS motor subscore is 6.  Skin: Skin is warm, dry and intact. He is not diaphoretic. No pallor.  Psychiatric: He has a  normal mood and affect. His behavior is normal. Judgment normal.    Results for orders placed or performed during the hospital encounter of 10/19/16 (from the past 48 hour(s))  CBC     Status: Abnormal   Collection Time: 10/19/16 12:44 PM  Result Value Ref Range   WBC 9.3 4.0 - 10.5 K/uL   RBC 4.40 4.22 - 5.81 MIL/uL   Hemoglobin 12.0 (L) 13.0 - 17.0 g/dL   HCT 37.9 (L) 39.0 - 52.0 %   MCV 86.1 78.0 - 100.0 fL   MCH 27.3 26.0 - 34.0 pg   MCHC 31.7 30.0 - 36.0 g/dL   RDW 13.4 11.5 - 15.5 %   Platelets 158 150 -  400 K/uL  Basic metabolic panel     Status: Abnormal   Collection Time: 10/19/16 12:44 PM  Result Value Ref Range   Sodium 134 (L) 135 - 145 mmol/L   Potassium 4.3 3.5 - 5.1 mmol/L   Chloride 104 101 - 111 mmol/L   CO2 24 22 - 32 mmol/L   Glucose, Bld 124 (H) 65 - 99 mg/dL   BUN 24 (H) 6 - 20 mg/dL   Creatinine, Ser 1.23 0.61 - 1.24 mg/dL   Calcium 8.8 (L) 8.9 - 10.3 mg/dL   GFR calc non Af Amer 51 (L) >60 mL/min   GFR calc Af Amer 59 (L) >60 mL/min    Comment: (NOTE) The eGFR has been calculated using the CKD EPI equation. This calculation has not been validated in all clinical situations. eGFR's persistently <60 mL/min signify possible Chronic Kidney Disease.    Anion gap 6 5 - 15  I-Stat Troponin, ED (not at [facility])     Status: None   Collection Time: 10/19/16 12:59 PM  Result Value Ref Range   Troponin i, poc 0.04 0.00 - 0.08 ng/mL   Comment 3            Comment: Due to the release kinetics of cTnI, a negative result within the first hours of the onset of symptoms does not rule out myocardial infarction with certainty. If myocardial infarction is still suspected, repeat the test at appropriate intervals.    Dg Wrist Complete Left  Result Date: 10/19/2016 CLINICAL DATA:  Fall. EXAM: LEFT WRIST - COMPLETE 3+ VIEW COMPARISON:  No recent prior. FINDINGS: Diffuse degenerative change. Degenerative changes most prominent about the first carpometacarpal joint.  Fracture of the trapezium cannot be excluded. Changes present may chest be degenerative. IMPRESSION: 1. Diffuse degenerative change. Degenerative changes most prominent about the first carpometacarpal joint. 2.  Fracture of the trapezium cannot be excluded . Electronically Signed   By: Marcello Moores  Register   On: 10/19/2016 13:27   Ct Chest Wo Contrast  Result Date: 10/19/2016 CLINICAL DATA:  Pt was walking with a walker, fell and hit his thoracic spine on the corner of a dresser. Pt has bruising and abrasion to same. Pt is presently taking antibiotics for a respiratory infection, with anterior chest pain, congested cough EXAM: CT CHEST WITHOUT CONTRAST TECHNIQUE: Multidetector CT imaging of the chest was performed following the standard protocol without IV contrast. COMPARISON:  07/01/2016 FINDINGS: Cardiovascular: No significant vascular findings. Normal heart size. No pericardial effusion. Coronary artery atherosclerosis involving the left main, circumflex coronary artery is. Abdominal aortic atherosclerosis. Mediastinum/Nodes: Trachea, esophagus and thyroid gland are normal. No axillary lymphadenopathy. Enlarged subcarinal lymph node measuring 15 mm in short axis. Mildly enlarged lower paratracheal lymph node measuring 10 mm in short axis. Left hilar lymph node measuring 11 mm in short axis. Lungs/Pleura: Small left pneumothorax measuring less than 10%. No right pneumothorax. Small left hemothorax. Bilateral lower lobe chronic interstitial lung disease. Superimposed bilateral lower lobe airspace disease likely reflecting atelectasis. Upper Abdomen: No acute abnormality. Musculoskeletal: Comminuted fracture of the left posterior eleventh rib at the costovertebral junction. Comminuted fracture of the left posterior tenth rib and adjacent left T10 transverse process. Comminuted fracture of the left posterior tenth rib and adjacent left T9 transverse process. Nondisplaced fracture of the left inferior articulating  facet of T6. IMPRESSION: 1. Small left pneumothorax measuring less than 10%. 2. Small left hemothorax. 3. Comminuted fracture of the left posterior eleventh rib at the costovertebral junction. Comminuted fracture  of the left posterior tenth rib and adjacent left T10 transverse process. Comminuted fracture of the left posterior tenth rib and adjacent left T9 transverse process. 4. Nondisplaced fracture of the left inferior articulating facet of T6. 5. Bilateral lower lobe chronic interstitial disease with superimposed atelectasis. 6. Aortic Atherosclerosis (ICD10-I70.0). Critical Value/emergent results were called by telephone at the time of interpretation on 10/19/2016 at 3:13 pm to [facility] , who verbally acknowledged these results. Electronically Signed   By: Kathreen Devoid   On: 10/19/2016 15:13   Ct T-spine No Charge  Result Date: 10/19/2016 CLINICAL DATA:  83 y/o M; fall with bruising and pain over the thoracic spine. EXAM: CT THORACIC SPINE WITHOUT CONTRAST TECHNIQUE: Multidetector CT images of the thoracic were obtained using the standard protocol without intravenous contrast. COMPARISON:  Concurrent CT chest. FINDINGS: Alignment: Normal. Vertebrae: Left T9 and T10 mildly displaced transverse process fractures. Question minimally displaced fracture of left T6 inferior articulating facet. No acute fracture or focal pathologic process. Paraspinal and other soft tissues: Left posterior 9 through 11 rib fractures near the rib heads with moderate displacement of fracture fragments. Small left pleural collection with increased attenuation probably representing hemothorax. Trace pneumothorax. Diffuse patchy consolidation of lung bases and peribronchial thickening with mucous plugging compatible with bronchopneumonia. Disc levels: Moderate degenerative changes of the thoracic spine. IMPRESSION: 1. Left T9 and T10 mildly displaced transverse process fractures. 2. Left posterior 9-11 rib fractures near rib heads  with comminution and moderate displacement of fracture fragments. 3. Men no displaced left T6 inferior facet fracture. 4. Small left pneumothorax.  Trace left pneumothorax. 5. Bilateral lower lobe bronchopneumonia. These results were called by telephone at the time of interpretation on 10/19/2016 at 3:29 pm to Dr. Shary Decamp , who verbally acknowledged these results. Electronically Signed   By: Kristine Garbe M.D.   On: 10/19/2016 15:35   Dg Chest Port 1 View  Result Date: 10/19/2016 CLINICAL DATA:  Pneumothorax on the left. EXAM: PORTABLE CHEST 1 VIEW COMPARISON:  Chest CT from earlier today FINDINGS: Trace left apical pneumothorax, less than 10%. Gas is likely underestimated due to positioning. No detected growth from prior. Chronic cardiomegaly and vascular pedicle widening, accentuated by rotation. Interstitial lung disease with low volume chest and diffuse coarse interstitial opacities. Known left rib fractures are not well visualized. IMPRESSION: 1. Trace left apical pneumothorax. No indication of increase compared to CT earlier today. 2. Chronic lung disease and cardiomegaly. Electronically Signed   By: Monte Fantasia M.D.   On: 10/19/2016 15:58      Assessment/Plan Fall from ground level at home Left 9-11 rib fxs with small left hemopneumothorax - admit to ICU, q2 neuro checks, continuous pulse ox - aggressive pulm toilet, IS - pain control - repeat CXR in AM - PT/OT tomorrow  - CXR: Trace left apical pneumothorax - no CT for now, watch closely T9-10 left TP fxs Nondisplaced T6 facet fx - NS consulted Left trapzeius fracture - patient in soft splint - hand surgery consulted Anticoagulated on Eliquis - last dose this AM, hold   Hx of stroke PAF GERD  FEN - clear liquids VTE - SCDs ID - currently on augmentin at home, can continue  Plan: admit to ICU for close observation and aggressive pulm toilet. NS and hand surgery consulted. AM labs and AM CXR  Brigid Re,  [facility] Surgery 10/19/2016, 4:06 PM Pager: [phone] Mon-Fri 7:00 am-4:30 pm Sat-Sun 7:00 am-11:30 am

## 2016-10-19 NOTE — ED Notes (Signed)
Patient transported to CT 

## 2016-10-20 ENCOUNTER — Observation Stay (HOSPITAL_COMMUNITY): Payer: Medicare Other

## 2016-10-20 DIAGNOSIS — G4700 Insomnia, unspecified: Secondary | ICD-10-CM | POA: Diagnosis not present

## 2016-10-20 DIAGNOSIS — K219 Gastro-esophageal reflux disease without esophagitis: Secondary | ICD-10-CM | POA: Diagnosis not present

## 2016-10-20 DIAGNOSIS — G20 Parkinson's disease: Secondary | ICD-10-CM | POA: Diagnosis not present

## 2016-10-20 DIAGNOSIS — K5900 Constipation, unspecified: Secondary | ICD-10-CM | POA: Diagnosis not present

## 2016-10-20 DIAGNOSIS — S272XXA Traumatic hemopneumothorax, initial encounter: Secondary | ICD-10-CM | POA: Diagnosis not present

## 2016-10-20 DIAGNOSIS — Z8551 Personal history of malignant neoplasm of bladder: Secondary | ICD-10-CM | POA: Diagnosis not present

## 2016-10-20 DIAGNOSIS — M1712 Unilateral primary osteoarthritis, left knee: Secondary | ICD-10-CM | POA: Diagnosis not present

## 2016-10-20 DIAGNOSIS — S2242XA Multiple fractures of ribs, left side, initial encounter for closed fracture: Secondary | ICD-10-CM | POA: Diagnosis not present

## 2016-10-20 DIAGNOSIS — R4789 Other speech disturbances: Secondary | ICD-10-CM | POA: Diagnosis not present

## 2016-10-20 DIAGNOSIS — S3993XA Unspecified injury of pelvis, initial encounter: Secondary | ICD-10-CM | POA: Diagnosis not present

## 2016-10-20 DIAGNOSIS — Y92009 Unspecified place in unspecified non-institutional (private) residence as the place of occurrence of the external cause: Secondary | ICD-10-CM | POA: Diagnosis not present

## 2016-10-20 DIAGNOSIS — Z96652 Presence of left artificial knee joint: Secondary | ICD-10-CM | POA: Diagnosis present

## 2016-10-20 DIAGNOSIS — I69398 Other sequelae of cerebral infarction: Secondary | ICD-10-CM | POA: Diagnosis not present

## 2016-10-20 DIAGNOSIS — N400 Enlarged prostate without lower urinary tract symptoms: Secondary | ICD-10-CM | POA: Diagnosis not present

## 2016-10-20 DIAGNOSIS — R2689 Other abnormalities of gait and mobility: Secondary | ICD-10-CM | POA: Diagnosis not present

## 2016-10-20 DIAGNOSIS — R1310 Dysphagia, unspecified: Secondary | ICD-10-CM | POA: Diagnosis not present

## 2016-10-20 DIAGNOSIS — J90 Pleural effusion, not elsewhere classified: Secondary | ICD-10-CM | POA: Diagnosis not present

## 2016-10-20 DIAGNOSIS — G8911 Acute pain due to trauma: Secondary | ICD-10-CM | POA: Diagnosis not present

## 2016-10-20 DIAGNOSIS — Z79899 Other long term (current) drug therapy: Secondary | ICD-10-CM | POA: Diagnosis not present

## 2016-10-20 DIAGNOSIS — J939 Pneumothorax, unspecified: Secondary | ICD-10-CM | POA: Diagnosis not present

## 2016-10-20 DIAGNOSIS — I480 Paroxysmal atrial fibrillation: Secondary | ICD-10-CM | POA: Diagnosis present

## 2016-10-20 DIAGNOSIS — Z8546 Personal history of malignant neoplasm of prostate: Secondary | ICD-10-CM | POA: Diagnosis not present

## 2016-10-20 DIAGNOSIS — F329 Major depressive disorder, single episode, unspecified: Secondary | ICD-10-CM | POA: Diagnosis not present

## 2016-10-20 DIAGNOSIS — R1312 Dysphagia, oropharyngeal phase: Secondary | ICD-10-CM | POA: Diagnosis not present

## 2016-10-20 DIAGNOSIS — S62172A Displaced fracture of trapezium [larger multangular], left wrist, initial encounter for closed fracture: Secondary | ICD-10-CM | POA: Diagnosis present

## 2016-10-20 DIAGNOSIS — E785 Hyperlipidemia, unspecified: Secondary | ICD-10-CM | POA: Diagnosis not present

## 2016-10-20 DIAGNOSIS — H04129 Dry eye syndrome of unspecified lacrimal gland: Secondary | ICD-10-CM | POA: Diagnosis not present

## 2016-10-20 DIAGNOSIS — S62172D Displaced fracture of trapezium [larger multangular], left wrist, subsequent encounter for fracture with routine healing: Secondary | ICD-10-CM | POA: Diagnosis not present

## 2016-10-20 DIAGNOSIS — S22059A Unspecified fracture of T5-T6 vertebra, initial encounter for closed fracture: Secondary | ICD-10-CM | POA: Diagnosis not present

## 2016-10-20 DIAGNOSIS — M6281 Muscle weakness (generalized): Secondary | ICD-10-CM | POA: Diagnosis not present

## 2016-10-20 DIAGNOSIS — S22078A Other fracture of T9-T10 vertebra, initial encounter for closed fracture: Secondary | ICD-10-CM | POA: Diagnosis not present

## 2016-10-20 DIAGNOSIS — N183 Chronic kidney disease, stage 3 (moderate): Secondary | ICD-10-CM | POA: Diagnosis not present

## 2016-10-20 DIAGNOSIS — W010XXA Fall on same level from slipping, tripping and stumbling without subsequent striking against object, initial encounter: Secondary | ICD-10-CM | POA: Diagnosis present

## 2016-10-20 DIAGNOSIS — Z8711 Personal history of peptic ulcer disease: Secondary | ICD-10-CM | POA: Diagnosis not present

## 2016-10-20 DIAGNOSIS — K2270 Barrett's esophagus without dysplasia: Secondary | ICD-10-CM | POA: Diagnosis not present

## 2016-10-20 DIAGNOSIS — M549 Dorsalgia, unspecified: Secondary | ICD-10-CM | POA: Diagnosis not present

## 2016-10-20 DIAGNOSIS — K449 Diaphragmatic hernia without obstruction or gangrene: Secondary | ICD-10-CM | POA: Diagnosis not present

## 2016-10-20 DIAGNOSIS — R262 Difficulty in walking, not elsewhere classified: Secondary | ICD-10-CM | POA: Diagnosis not present

## 2016-10-20 DIAGNOSIS — Z85028 Personal history of other malignant neoplasm of stomach: Secondary | ICD-10-CM | POA: Diagnosis not present

## 2016-10-20 DIAGNOSIS — Z8572 Personal history of non-Hodgkin lymphomas: Secondary | ICD-10-CM | POA: Diagnosis not present

## 2016-10-20 DIAGNOSIS — C8580 Other specified types of non-Hodgkin lymphoma, unspecified site: Secondary | ICD-10-CM | POA: Diagnosis not present

## 2016-10-20 DIAGNOSIS — Z87891 Personal history of nicotine dependence: Secondary | ICD-10-CM | POA: Diagnosis not present

## 2016-10-20 DIAGNOSIS — Z7901 Long term (current) use of anticoagulants: Secondary | ICD-10-CM | POA: Diagnosis not present

## 2016-10-20 DIAGNOSIS — Z9221 Personal history of antineoplastic chemotherapy: Secondary | ICD-10-CM | POA: Diagnosis not present

## 2016-10-20 DIAGNOSIS — R278 Other lack of coordination: Secondary | ICD-10-CM | POA: Diagnosis not present

## 2016-10-20 DIAGNOSIS — Z85828 Personal history of other malignant neoplasm of skin: Secondary | ICD-10-CM | POA: Diagnosis not present

## 2016-10-20 DIAGNOSIS — S22079A Unspecified fracture of T9-T10 vertebra, initial encounter for closed fracture: Secondary | ICD-10-CM | POA: Diagnosis not present

## 2016-10-20 DIAGNOSIS — S5290XA Unspecified fracture of unspecified forearm, initial encounter for closed fracture: Secondary | ICD-10-CM | POA: Diagnosis not present

## 2016-10-20 DIAGNOSIS — R079 Chest pain, unspecified: Secondary | ICD-10-CM | POA: Diagnosis not present

## 2016-10-20 DIAGNOSIS — H3530 Unspecified macular degeneration: Secondary | ICD-10-CM | POA: Diagnosis not present

## 2016-10-20 DIAGNOSIS — Z9181 History of falling: Secondary | ICD-10-CM | POA: Diagnosis not present

## 2016-10-20 DIAGNOSIS — Z8673 Personal history of transient ischemic attack (TIA), and cerebral infarction without residual deficits: Secondary | ICD-10-CM | POA: Diagnosis not present

## 2016-10-20 DIAGNOSIS — S2242XD Multiple fractures of ribs, left side, subsequent encounter for fracture with routine healing: Secondary | ICD-10-CM | POA: Diagnosis not present

## 2016-10-20 LAB — BASIC METABOLIC PANEL
ANION GAP: 7 (ref 5–15)
BUN: 23 mg/dL — AB (ref 6–20)
CALCIUM: 8.3 mg/dL — AB (ref 8.9–10.3)
CO2: 22 mmol/L (ref 22–32)
Chloride: 105 mmol/L (ref 101–111)
Creatinine, Ser: 1.11 mg/dL (ref 0.61–1.24)
GFR calc Af Amer: >60 mL/min (ref >60)
GFR, EST NON AFRICAN AMERICAN: 58 mL/min — AB (ref >60)
GLUCOSE: 123 mg/dL — AB (ref 65–99)
POTASSIUM: 4.4 mmol/L (ref 3.5–5.1)
SODIUM: 134 mmol/L — AB (ref 135–145)

## 2016-10-20 LAB — CBC
HCT: 35.5 % — ABNORMAL LOW (ref 39.0–52.0)
Hemoglobin: 11.5 g/dL — ABNORMAL LOW (ref 13.0–17.0)
MCH: 27.9 pg (ref 26.0–34.0)
MCHC: 32.4 g/dL (ref 30.0–36.0)
MCV: 86.2 fL (ref 78.0–100.0)
PLATELETS: 162 10*3/uL (ref 150–400)
RBC: 4.12 MIL/uL — AB (ref 4.22–5.81)
RDW: 13.5 % (ref 11.5–15.5)
WBC: 8.8 10*3/uL (ref 4.0–10.5)

## 2016-10-20 MED ORDER — APIXABAN 5 MG PO TABS
5.0000 mg | ORAL_TABLET | Freq: Two times a day (BID) | ORAL | Status: DC
  Administered 2016-10-20 – 2016-10-26 (×13): 5 mg via ORAL
  Filled 2016-10-20 (×16): qty 1

## 2016-10-20 MED ORDER — TRAMADOL HCL 50 MG PO TABS
50.0000 mg | ORAL_TABLET | Freq: Four times a day (QID) | ORAL | Status: DC
  Administered 2016-10-20 – 2016-10-27 (×23): 50 mg via ORAL
  Filled 2016-10-20 (×26): qty 1

## 2016-10-20 MED ORDER — ORAL CARE MOUTH RINSE
15.0000 mL | Freq: Two times a day (BID) | OROMUCOSAL | Status: DC
  Administered 2016-10-20 – 2016-10-27 (×15): 15 mL via OROMUCOSAL

## 2016-10-20 NOTE — Progress Notes (Signed)
Modified Barium Swallow Progress Note  Patient Details  Name: Jason [name] MRN: [id number] Date of Birth: [birth year]  Today's Date: 10/20/2016  Modified Barium Swallow completed.  Full report located under Chart Review in the Imaging Section.  Brief recommendations include the following:  Clinical Impression  Patient presents with a moderate oropharyngeal phase dysphagia characterized by decreased oral bolus control and oral residuals due to respiratory insufficiency, base of tongue weakness resulting in severe vallecular residuals and decreased laryngeal closure. Deep penetration of thin and nectar thick liquids noted both during initial swallow and post swallow due to combination of pooling oral and pharyngeal residuals. Although no penetration noted with pureed solids or honey thick liquids, risk of decreased airway protection high as residuals mix with secretions. Recommend continuing NPO status excepts meds crushed in puree and ice chips for pleasure following thorough mouth care. SLP will f/u. Potential to advance diet good with improved respiratory function which will decrease aspiration risk.    Swallow Evaluation Recommendations       SLP Diet Recommendations: NPO except meds;Ice chips PRN after oral care       Medication Administration: Crushed with puree               Oral Care Recommendations: Oral care QID       Jason Neal MA, CCC-SLP [phone]  Jason [name] 10/20/2016,1:33 PM

## 2016-10-20 NOTE — Progress Notes (Signed)
Initial Nutrition Assessment  DOCUMENTATION CODES:   Obesity unspecified  INTERVENTION:   IF short-term tube feeding required recommend Jevity 1.2 start @ 25 ml/hr and increase by 10 ml every 4 hours to goal rate of 65 ml/hr 30 ml Prostat daily Provides: 1972 kcal, 101 grams protein, and 1265 ml free water.    IF diet advanced, supplement as appropriate.    NUTRITION DIAGNOSIS:   Inadequate oral intake related to dysphagia as evidenced by NPO status.  GOAL:   Patient will meet greater than or equal to 90% of their needs  MONITOR:   Diet advancement, I & O's  REASON FOR ASSESSMENT:   Malnutrition Screening Tool    ASSESSMENT:   Pt with PMH of hiatal hernias, PAF, newly dx Parkinson's, and dysphagia admitted after a fall with multiple L rib fx and PTX (resloved), L trapezium fx (splint).    Pt discussed during ICU rounds and with RN.  Spoke with pt and his daughter. Pt lives alone with help from his daughter. She cooks his lunch/dinner or they go out. He has had a good appetite and had recovered from his dysphagia. Daughter feels pt needs more help at home. He has had no recent weight loss.   Pt failed swallow eval today. Pt has had a short term feeding tube in the past and daughter open to possibility if needed.   Medications reviewed and include: colace, senokot IVF: D5 1/2 NS with 20 mEq/L KCL @ 10 ml/hr Labs reviewed: Na 134 (L)   Diet Order:  Diet NPO time specified Except for: Sips with Meds  Skin:  Reviewed, no issues  Last BM:  unknown  Height:   Ht Readings from Last 1 Encounters:  10/19/16 5\' 10"  (1.778 m)    Weight:   Wt Readings from Last 1 Encounters:  10/19/16 222 lb 14.2 oz (101.1 kg)    Ideal Body Weight:  75.4 kg  BMI:  Body mass index is 31.98 kg/m.  Estimated Nutritional Needs:   Kcal:  1800-2000  Protein:  90-115 grams  Fluid:  > 2 L/day  EDUCATION NEEDS:   No education needs identified at this time  [place], [place], [place] Pager [phone] After Hours Pager

## 2016-10-20 NOTE — Consult Note (Signed)
Reason for Consult:Left trapezium fracture Referring Physician: Georganna Skeans  Jason [name] is an 83 y.o. male.  HPI: Jason [name] was admitted after a ground level fall yesterday. He was unsure about the mechanics of the fall. He c/o rib/back pain and, to a lesser extent, left thumb pain. X-rays showed a likely left trapezium fracture and hand surgery was consulted. There was some communication breakdown yesterday to account for the late consultation. He has been in a thumb spica splint.  Past Medical History:  Diagnosis Date  . Arthritis   . Barrett esophagus   . Bladder cancer ([place]) dx'd 5784,6962   surg only  . Colon polyps    adenomatous  . Difficulty sleeping    OCCASIONALLY  . Diverticulosis of colon (without mention of hemorrhage)   . Frequency of urination   . GERD (gastroesophageal reflux disease)   . Hiatal hernia   . History of bladder cancer 1981   EXCISION ONLY  . History of skin cancer    BASAL CELL  . Hyperlipidemia   . Lymphoma, non-Hodgkin's ([place]) 2011   [place]   . Macular degeneration    LEFT EYE  . Multiple nodules of lung 09/16/2011  . PAF (paroxysmal atrial fibrillation) ([place])   . Personal history of colonic polyps 07/15/2010   tubular adenoma  . Prostate cancer [facility])    patient denies  . PUD (peptic ulcer disease)   . Stomach cancer ([place])    NHL origin  . Stroke ([place])   . Weakness     Past Surgical History:  Procedure Laterality Date  . APPENDECTOMY    . bone spur     left shoulder  . CHOLECYSTECTOMY    . KNEE ARTHROPLASTY Left 07/30/2015   Procedure: LEFT TOTAL KNEE ARTHROPLASTY WITH COMPUTER NAVIGATION;  Surgeon: Rod Can, MD;  Location: [facility];  Service: Orthopedics;  Laterality: Left;  . LASER OF PROSTATE W/ GREEN LIGHT PVP    . TENDON REPAIR     right arm  . TRIGGER FINGER RELEASE     bilateral hands  . WRIST SURGERY     Bilateral    Family History  Problem Relation Age of Onset  . Heart attack Father   . Other Mother         "old age"    Social History:  reports that he quit smoking about 50 years ago. He quit smokeless tobacco use about 18 years ago. He reports that he drinks alcohol. He reports that he does not use drugs.  Allergies: No Known Allergies  Medications: I have reviewed the patient's current medications.  Results for orders placed or performed during the hospital encounter of 10/19/16 (from the past 48 hour(s))  CBC     Status: Abnormal   Collection Time: 10/19/16 12:44 PM  Result Value Ref Range   WBC 9.3 4.0 - 10.5 K/uL   RBC 4.40 4.22 - 5.81 MIL/uL   Hemoglobin 12.0 (L) 13.0 - 17.0 g/dL   HCT 37.9 (L) 39.0 - 52.0 %   MCV 86.1 78.0 - 100.0 fL   MCH 27.3 26.0 - 34.0 pg   MCHC 31.7 30.0 - 36.0 g/dL   RDW 13.4 11.5 - 15.5 %   Platelets 158 150 - 400 K/uL  Basic metabolic panel     Status: Abnormal   Collection Time: 10/19/16 12:44 PM  Result Value Ref Range   Sodium 134 (L) 135 - 145 mmol/L   Potassium 4.3 3.5 - 5.1 mmol/L  Chloride 104 101 - 111 mmol/L   CO2 24 22 - 32 mmol/L   Glucose, Bld 124 (H) 65 - 99 mg/dL   BUN 24 (H) 6 - 20 mg/dL   Creatinine, Ser 1.23 0.61 - 1.24 mg/dL   Calcium 8.8 (L) 8.9 - 10.3 mg/dL   GFR calc non Af Amer 51 (L) >60 mL/min   GFR calc Af Amer 59 (L) >60 mL/min    Comment: (NOTE) The eGFR has been calculated using the CKD EPI equation. This calculation has not been validated in all clinical situations. eGFR's persistently <60 mL/min signify possible Chronic Kidney Disease.    Anion gap 6 5 - 15  I-Stat Troponin, ED (not at [facility])     Status: None   Collection Time: 10/19/16 12:59 PM  Result Value Ref Range   Troponin i, poc 0.04 0.00 - 0.08 ng/mL   Comment 3            Comment: Due to the release kinetics of cTnI, a negative result within the first hours of the onset of symptoms does not rule out myocardial infarction with certainty. If myocardial infarction is still suspected, repeat the test at appropriate intervals.   Protime-INR      Status: Abnormal   Collection Time: 10/19/16  4:38 PM  Result Value Ref Range   Prothrombin Time 16.8 (H) 11.4 - 15.2 seconds   INR 1.35   APTT     Status: Abnormal   Collection Time: 10/19/16  4:38 PM  Result Value Ref Range   aPTT 42 (H) 24 - 36 seconds    Comment:        IF BASELINE aPTT IS ELEVATED, SUGGEST PATIENT RISK ASSESSMENT BE USED TO DETERMINE APPROPRIATE ANTICOAGULANT THERAPY.   MRSA PCR Screening     Status: Abnormal   Collection Time: 10/19/16  6:23 PM  Result Value Ref Range   MRSA by PCR POSITIVE (A) NEGATIVE    Comment:        The GeneXpert MRSA Assay (FDA approved for NASAL specimens only), is one component of a comprehensive MRSA colonization surveillance program. It is not intended to diagnose MRSA infection nor to guide or monitor treatment for MRSA infections. RESULT CALLED TO, READ BACK BY AND VERIFIED WITH: M.OLSON,RN AT 2215 BY L.PITT 10/19/16   CBC     Status: Abnormal   Collection Time: 10/20/16  5:16 AM  Result Value Ref Range   WBC 8.8 4.0 - 10.5 K/uL   RBC 4.12 (L) 4.22 - 5.81 MIL/uL   Hemoglobin 11.5 (L) 13.0 - 17.0 g/dL   HCT 35.5 (L) 39.0 - 52.0 %   MCV 86.2 78.0 - 100.0 fL   MCH 27.9 26.0 - 34.0 pg   MCHC 32.4 30.0 - 36.0 g/dL   RDW 13.5 11.5 - 15.5 %   Platelets 162 150 - 400 K/uL  Basic metabolic panel     Status: Abnormal   Collection Time: 10/20/16  5:16 AM  Result Value Ref Range   Sodium 134 (L) 135 - 145 mmol/L   Potassium 4.4 3.5 - 5.1 mmol/L   Chloride 105 101 - 111 mmol/L   CO2 22 22 - 32 mmol/L   Glucose, Bld 123 (H) 65 - 99 mg/dL   BUN 23 (H) 6 - 20 mg/dL   Creatinine, Ser 1.11 0.61 - 1.24 mg/dL   Calcium 8.3 (L) 8.9 - 10.3 mg/dL   GFR calc non Af Amer 58 (L) >60 mL/min   GFR calc  Af Amer >60 >60 mL/min    Comment: (NOTE) The eGFR has been calculated using the CKD EPI equation. This calculation has not been validated in all clinical situations. eGFR's persistently <60 mL/min signify possible Chronic  Kidney Disease.    Anion gap 7 5 - 15    Dg Wrist Complete Left  Result Date: 10/19/2016 CLINICAL DATA:  Fall. EXAM: LEFT WRIST - COMPLETE 3+ VIEW COMPARISON:  No recent prior. FINDINGS: Diffuse degenerative change. Degenerative changes most prominent about the first carpometacarpal joint. Fracture of the trapezium cannot be excluded. Changes present may chest be degenerative. IMPRESSION: 1. Diffuse degenerative change. Degenerative changes most prominent about the first carpometacarpal joint. 2.  Fracture of the trapezium cannot be excluded . Electronically Signed   By: Marcello Moores  Register   On: 10/19/2016 13:27   Ct Chest Wo Contrast  Result Date: 10/19/2016 CLINICAL DATA:  Pt was walking with a walker, fell and hit his thoracic spine on the corner of a dresser. Pt has bruising and abrasion to same. Pt is presently taking antibiotics for a respiratory infection, with anterior chest pain, congested cough EXAM: CT CHEST WITHOUT CONTRAST TECHNIQUE: Multidetector CT imaging of the chest was performed following the standard protocol without IV contrast. COMPARISON:  07/01/2016 FINDINGS: Cardiovascular: No significant vascular findings. Normal heart size. No pericardial effusion. Coronary artery atherosclerosis involving the left main, circumflex coronary artery is. Abdominal aortic atherosclerosis. Mediastinum/Nodes: Trachea, esophagus and thyroid gland are normal. No axillary lymphadenopathy. Enlarged subcarinal lymph node measuring 15 mm in short axis. Mildly enlarged lower paratracheal lymph node measuring 10 mm in short axis. Left hilar lymph node measuring 11 mm in short axis. Lungs/Pleura: Small left pneumothorax measuring less than 10%. No right pneumothorax. Small left hemothorax. Bilateral lower lobe chronic interstitial lung disease. Superimposed bilateral lower lobe airspace disease likely reflecting atelectasis. Upper Abdomen: No acute abnormality. Musculoskeletal: Comminuted fracture of the left  posterior eleventh rib at the costovertebral junction. Comminuted fracture of the left posterior tenth rib and adjacent left T10 transverse process. Comminuted fracture of the left posterior tenth rib and adjacent left T9 transverse process. Nondisplaced fracture of the left inferior articulating facet of T6. IMPRESSION: 1. Small left pneumothorax measuring less than 10%. 2. Small left hemothorax. 3. Comminuted fracture of the left posterior eleventh rib at the costovertebral junction. Comminuted fracture of the left posterior tenth rib and adjacent left T10 transverse process. Comminuted fracture of the left posterior tenth rib and adjacent left T9 transverse process. 4. Nondisplaced fracture of the left inferior articulating facet of T6. 5. Bilateral lower lobe chronic interstitial disease with superimposed atelectasis. 6. Aortic Atherosclerosis (ICD10-I70.0). Critical Value/emergent results were called by telephone at the time of interpretation on 10/19/2016 at 3:13 pm to [facility] , who verbally acknowledged these results. Electronically Signed   By: Kathreen Devoid   On: 10/19/2016 15:13   Dg Pelvis Portable  Result Date: 10/20/2016 CLINICAL DATA:  Recent fall EXAM: PORTABLE PELVIS 1-2 VIEWS COMPARISON:  10/19/2016 FINDINGS: Osteopenia. No acute fracture. No dislocation. Mild vascular calcifications in the femoral arteries. IMPRESSION: No acute bony pathology. Electronically Signed   By: Marybelle Killings M.D.   On: 10/20/2016 07:36   Dg Pelvis Portable  Result Date: 10/19/2016 CLINICAL DATA:  Pain following fall EXAM: PORTABLE PELVIS 1-2 VIEWS COMPARISON:  None. FINDINGS: There is no fracture or dislocation. There is mild symmetric narrowing of both hip joints. No erosive change. IMPRESSION: Mild symmetric narrowing of both hip joints. No evident acute fracture or  dislocation. Electronically Signed   By: Lowella Grip III M.D.   On: 10/19/2016 16:39   Ct T-spine No Charge  Result Date:  10/19/2016 CLINICAL DATA:  83 y/o M; fall with bruising and pain over the thoracic spine. EXAM: CT THORACIC SPINE WITHOUT CONTRAST TECHNIQUE: Multidetector CT images of the thoracic were obtained using the standard protocol without intravenous contrast. COMPARISON:  Concurrent CT chest. FINDINGS: Alignment: Normal. Vertebrae: Left T9 and T10 mildly displaced transverse process fractures. Question minimally displaced fracture of left T6 inferior articulating facet. No acute fracture or focal pathologic process. Paraspinal and other soft tissues: Left posterior 9 through 11 rib fractures near the rib heads with moderate displacement of fracture fragments. Small left pleural collection with increased attenuation probably representing hemothorax. Trace pneumothorax. Diffuse patchy consolidation of lung bases and peribronchial thickening with mucous plugging compatible with bronchopneumonia. Disc levels: Moderate degenerative changes of the thoracic spine. IMPRESSION: 1. Left T9 and T10 mildly displaced transverse process fractures. 2. Left posterior 9-11 rib fractures near rib heads with comminution and moderate displacement of fracture fragments. 3. Men no displaced left T6 inferior facet fracture. 4. Small left pneumothorax.  Trace left pneumothorax. 5. Bilateral lower lobe bronchopneumonia. These results were called by telephone at the time of interpretation on 10/19/2016 at 3:29 pm to Dr. Shary Decamp , who verbally acknowledged these results. Electronically Signed   By: Kristine Garbe M.D.   On: 10/19/2016 15:35   Dg Chest Port 1 View  Result Date: 10/20/2016 CLINICAL DATA:  Chest pain.  Recent fall EXAM: PORTABLE CHEST 1 VIEW COMPARISON:  October 19, 2016 chest radiograph and chest CT FINDINGS: There is no appreciable pneumothorax. There is a small pleural effusion on the left with left lower lobe consolidation. There is right base atelectasis. There is cardiomegaly with pulmonary vascularity within normal  limits. There is aortic atherosclerosis. Known rib fractures on the left are much better seen on recent CT. IMPRESSION: No pneumothorax currently evident. There is now airspace consolidation in the left lower lobe which appears increased compared to chest radiograph obtained 1 day prior. Question contusion versus pneumonia. Both entities may be present concurrently. Small left pleural effusion. Mild right base atelectasis. Stable cardiomegaly. Aortic atherosclerosis. Aortic Atherosclerosis (ICD10-I70.0). Electronically Signed   By: Lowella Grip III M.D.   On: 10/20/2016 07:58   Dg Chest Port 1 View  Result Date: 10/19/2016 CLINICAL DATA:  Pneumothorax on the left. EXAM: PORTABLE CHEST 1 VIEW COMPARISON:  Chest CT from earlier today FINDINGS: Trace left apical pneumothorax, less than 10%. Gas is likely underestimated due to positioning. No detected growth from prior. Chronic cardiomegaly and vascular pedicle widening, accentuated by rotation. Interstitial lung disease with low volume chest and diffuse coarse interstitial opacities. Known left rib fractures are not well visualized. IMPRESSION: 1. Trace left apical pneumothorax. No indication of increase compared to CT earlier today. 2. Chronic lung disease and cardiomegaly. Electronically Signed   By: Monte Fantasia M.D.   On: 10/19/2016 15:58   Dg Swallowing Func-speech Pathology  Result Date: 10/20/2016 Objective Swallowing Evaluation: Type of Study: MBS-Modified Barium Swallow Study Patient Details Name: SONNY ANTHES MRN: [id number] Date of Birth: 22-Mar-1928 Today's Date: 10/20/2016 Time: SLP Start Time (ACUTE ONLY): 1145-SLP Stop Time (ACUTE ONLY): 1200 SLP Time Calculation (min) (ACUTE ONLY): 15 min Past Medical History: Past Medical History: Diagnosis Date . Arthritis  . Barrett esophagus  . Bladder cancer ([place]) dx'd 2297,9892  surg only . Colon polyps   adenomatous . Difficulty  sleeping   OCCASIONALLY . Diverticulosis of colon (without mention of  hemorrhage)  . Frequency of urination  . GERD (gastroesophageal reflux disease)  . Hiatal hernia  . History of bladder cancer 1981  EXCISION ONLY . History of skin cancer   BASAL CELL . Hyperlipidemia  . Lymphoma, non-Hodgkin's ([place]) 2011  [place]  . Macular degeneration   LEFT EYE . Multiple nodules of lung 09/16/2011 . PAF (paroxysmal atrial fibrillation) ([place])  . Personal history of colonic polyps 07/15/2010  tubular adenoma . Prostate cancer [facility])   patient denies . PUD (peptic ulcer disease)  . Stomach cancer ([place])   NHL origin . Stroke ([place])  . Weakness  Past Surgical History: Past Surgical History: Procedure Laterality Date . APPENDECTOMY   . bone spur    left shoulder . CHOLECYSTECTOMY   . KNEE ARTHROPLASTY Left 07/30/2015  Procedure: LEFT TOTAL KNEE ARTHROPLASTY WITH COMPUTER NAVIGATION;  Surgeon: Rod Can, MD;  Location: [facility];  Service: Orthopedics;  Laterality: Left; . LASER OF PROSTATE W/ GREEN LIGHT PVP   . TENDON REPAIR    right arm . TRIGGER FINGER RELEASE    bilateral hands . WRIST SURGERY    Bilateral HPI: Patient is an 83 yo male who fell at home this morning while using his walker. He fell backwards into a piece of furniture and immediately felt pain in his left back and ribs. Patient is unsure of why he fell, thinks he may have just lost his footing. Patient denies hitting his head, LOC, dizziness, numbness/tingling, SOB, palpitations, blurred vision, abdominal pain, nausea, vomiting.  Patient also having pain in left hand over thumb, able to bend and grip still.  Patient does take Eliquis, last dose was this AM. Workup in the ED showed fractures of 9-11 left ribs and small left hemopneumothorax, and T9 and T10 TP fractures, also non-displaced fx of T6 facet.  No Data Recorded Assessment / Plan / Recommendation CHL IP CLINICAL IMPRESSIONS 10/20/2016 Clinical Impression Patient presents with a moderate oropharyngeal phase dysphagia characterized by decreased oral bolus control and oral  residuals due to respiratory insufficiency, base of tongue weakness resulting in severe vallecular residuals and decreased laryngeal closure. Deep penetration of thin and nectar thick liquids noted both during initial swallow and post swallow due to combination of pooling oral and pharyngeal residuals. Although no penetration noted with pureed solids or honey thick liquids, risk of decreased airway protection high as residuals mix with secretions. Recommend continuing NPO status excepts meds crushed in puree and ice chips for pleasure following thorough mouth care. SLP will f/u. Potential to advance diet good with improved respiratory function which will decrease aspiration risk.  SLP Visit Diagnosis Dysphagia, unspecified (R13.10) Attention and concentration deficit following -- Frontal lobe and executive function deficit following -- Impact on safety and function Moderate aspiration risk;Severe aspiration risk   CHL IP TREATMENT RECOMMENDATION 10/20/2016 Treatment Recommendations Therapy as outlined in treatment plan below   Prognosis 10/20/2016 Prognosis for Safe Diet Advancement Good Barriers to Reach Goals -- Barriers/Prognosis Comment -- CHL IP DIET RECOMMENDATION 10/20/2016 SLP Diet Recommendations NPO except meds;Ice chips PRN after oral care Liquid Administration via -- Medication Administration Crushed with puree Compensations -- Postural Changes --   CHL IP OTHER RECOMMENDATIONS 10/20/2016 Recommended Consults -- Oral Care Recommendations Oral care QID Other Recommendations --   CHL IP FOLLOW UP RECOMMENDATIONS 10/20/2016 Follow up Recommendations (No Data)   CHL IP FREQUENCY AND DURATION 10/20/2016 Speech Therapy Frequency (ACUTE ONLY) min 2x/week  Treatment Duration 2 weeks      CHL IP ORAL PHASE 10/20/2016 Oral Phase Impaired Oral - Pudding Teaspoon -- Oral - Pudding Cup -- Oral - Honey Teaspoon Decreased bolus cohesion;Lingual/palatal residue Oral - Honey Cup -- Oral - Nectar Teaspoon Decreased bolus  cohesion;Lingual/palatal residue Oral - Nectar Cup Decreased bolus cohesion;Lingual/palatal residue Oral - Nectar Straw -- Oral - Thin Teaspoon Decreased bolus cohesion;Lingual/palatal residue Oral - Thin Cup -- Oral - Thin Straw -- Oral - Puree WFL Oral - Mech Soft -- Oral - Regular -- Oral - Multi-Consistency -- Oral - Pill -- Oral Phase - Comment --  CHL IP PHARYNGEAL PHASE 10/20/2016 Pharyngeal Phase Impaired Pharyngeal- Pudding Teaspoon -- Pharyngeal -- Pharyngeal- Pudding Cup -- Pharyngeal -- Pharyngeal- Honey Teaspoon Delayed swallow initiation-vallecula;Reduced tongue base retraction;Pharyngeal residue - valleculae Pharyngeal -- Pharyngeal- Honey Cup -- Pharyngeal -- Pharyngeal- Nectar Teaspoon Delayed swallow initiation-vallecula;Penetration/Aspiration during swallow;Penetration/Apiration after swallow;Pharyngeal residue - valleculae;Reduced tongue base retraction Pharyngeal Material enters airway, CONTACTS cords and not ejected out Pharyngeal- Nectar Cup Delayed swallow initiation-vallecula;Penetration/Aspiration during swallow;Penetration/Apiration after swallow;Pharyngeal residue - valleculae;Reduced tongue base retraction Pharyngeal Material enters airway, CONTACTS cords and not ejected out Pharyngeal- Nectar Straw -- Pharyngeal -- Pharyngeal- Thin Teaspoon Delayed swallow initiation-vallecula;Penetration/Aspiration during swallow;Penetration/Apiration after swallow;Pharyngeal residue - valleculae;Reduced tongue base retraction Pharyngeal Material enters airway, CONTACTS cords and not ejected out Pharyngeal- Thin Cup -- Pharyngeal -- Pharyngeal- Thin Straw -- Pharyngeal -- Pharyngeal- Puree Delayed swallow initiation-vallecula;Reduced tongue base retraction;Pharyngeal residue - valleculae Pharyngeal -- Pharyngeal- Mechanical Soft -- Pharyngeal -- Pharyngeal- Regular -- Pharyngeal -- Pharyngeal- Multi-consistency -- Pharyngeal -- Pharyngeal- Pill -- Pharyngeal -- Pharyngeal Comment --  CHL IP CERVICAL  ESOPHAGEAL PHASE 10/20/2016 Cervical Esophageal Phase WFL Pudding Teaspoon -- Pudding Cup -- Honey Teaspoon -- Honey Cup -- Nectar Teaspoon -- Nectar Cup -- Nectar Straw -- Thin Teaspoon -- Thin Cup -- Thin Straw -- Puree -- Mechanical Soft -- Regular -- Multi-consistency -- Pill -- Cervical Esophageal Comment -- CHL IP GO 10/20/2016 Functional Assessment Tool Used skilled clinical judgement Functional Limitations Swallowing Swallow Current Status (F7902) CL Swallow Goal Status (I0973) CJ Swallow Discharge Status (Z3299) (None) Motor Speech Current Status (M4268) (None) Motor Speech Goal Status (T4196) (None) Motor Speech Goal Status (Q2297) (None) Spoken Language Comprehension Current Status (L8921) (None) Spoken Language Comprehension Goal Status (J9417) (None) Spoken Language Comprehension Discharge Status (E0814) (None) Spoken Language Expression Current Status (G8185) (None) Spoken Language Expression Goal Status (U3149) (None) Spoken Language Expression Discharge Status [phone] (None) Attention Current Status (Z8588) (None) Attention Goal Status (F0277) (None) Attention Discharge Status (A1287) (None) Memory Current Status (O6767) (None) Memory Goal Status (M0947) (None) Memory Discharge Status (S9628) (None) Voice Current Status (Z6629) (None) Voice Goal Status (U7654) (None) Voice Discharge Status (Y5035) (None) Other Speech-Language Pathology Functional Limitation Current Status (W6568) (None) Other Speech-Language Pathology Functional Limitation Goal Status (L2751) (None) Other Speech-Language Pathology Functional Limitation Discharge Status [id number]) (None) Gabriel Rainwater MA, CCC-SLP [phone] McCoy Leah Meryl 10/20/2016, 1:32 PM               Review of Systems  Constitutional: Negative for weight loss.  HENT: Negative for ear discharge, ear pain, hearing loss and tinnitus.   Eyes: Negative for blurred vision, double vision, photophobia and pain.  Respiratory: Negative for cough, sputum production and  shortness of breath.   Cardiovascular: Positive for chest pain.  Gastrointestinal: Negative for abdominal pain, nausea and vomiting.  Genitourinary: Negative for dysuria, flank pain, frequency and urgency.  Musculoskeletal: Positive for joint pain (  Left thumb). Negative for back pain, falls, myalgias and neck pain.  Neurological: Negative for dizziness, tingling, sensory change, focal weakness, loss of consciousness and headaches.  Endo/Heme/Allergies: Does not bruise/bleed easily.  Psychiatric/Behavioral: Negative for depression, memory loss and substance abuse. The patient is not nervous/anxious.    Blood pressure (!) 125/57, pulse 72, temperature 97.8 F (36.6 C), temperature source Axillary, resp. rate 19, height 5' 10" (1.778 m), weight 101.1 kg (222 lb 14.2 oz), SpO2 96 %. Physical Exam  Constitutional: He appears well-developed and well-nourished. No distress.  HENT:  Head: Normocephalic.  Eyes: Conjunctivae are normal. Right eye exhibits no discharge. Left eye exhibits no discharge. No scleral icterus.  Cardiovascular: Normal rate and regular rhythm.   Respiratory: Effort normal. No respiratory distress.  Musculoskeletal:  Left shoulder, elbow, wrist, digits- no skin wounds, thumb spica splint in splace, mild pain with flexion/ext against resistance, no tenderness with axial load  Sens  Ax/R/M/U intact  Mot   Ax/ R/ PIN/ M/ AIN/ U unable to assess   Neurological: He is alert.  Skin: Skin is warm and dry. He is not diaphoretic.  Psychiatric: He has a normal mood and affect. His behavior is normal.    Assessment/Plan: Fall Left trapezium fracture -- Agree with thumb spica. He should f/u with Dr. Caralyn Guile in 2-3 weeks.    Lisette Abu, PA-C Orthopedic Surgery [phone] 10/20/2016, 2:32 PM

## 2016-10-20 NOTE — Care Management Note (Signed)
Case Management Note  Patient Details  Name: Jason [name] MRN: [id number] Date of Birth: [birth year]  Subjective/Objective:  Pt admitted on 10/19/16 s/p ground level fall with multiple Lt rib fx with PTX, Lt trapezium fx, and thoracic spine fractures.  PTA, pt resides at home alone; uses RW to ambulate.                 Action/Plan: PT/OT evaluations pending.  Will follow for discharge planning as pt progresses.    Expected Discharge Date:                  Expected Discharge Plan:  Skilled Nursing Facility  In-House Referral:  Clinical Social Work  Discharge planning Services     Post Acute Care Choice:    Choice offered to:     DME Arranged:    DME Agency:     HH Arranged:    [place] Agency:     Status of Service:  In process, will continue to follow  If discussed at Long Length of Stay Meetings, dates discussed:    Additional Comments:  Reinaldo Raddle, RN, BSN  Trauma/Neuro ICU Case Manager [phone]

## 2016-10-20 NOTE — Evaluation (Signed)
Clinical/Bedside Swallow Evaluation Patient Details  Name: Jason [name] MRN: [id number] Date of Birth: [birth year]  Today's Date: 10/20/2016 Time:  22      Past Medical History:  Past Medical History:  Diagnosis Date  . Arthritis   . Barrett esophagus   . Bladder cancer ([place]) dx'd 7253,6644   surg only  . Colon polyps    adenomatous  . Difficulty sleeping    OCCASIONALLY  . Diverticulosis of colon (without mention of hemorrhage)   . Frequency of urination   . GERD (gastroesophageal reflux disease)   . Hiatal hernia   . History of bladder cancer 1981   EXCISION ONLY  . History of skin cancer    BASAL CELL  . Hyperlipidemia   . Lymphoma, non-Hodgkin's ([place]) 2011   [place]   . Macular degeneration    LEFT EYE  . Multiple nodules of lung 09/16/2011  . PAF (paroxysmal atrial fibrillation) ([place])   . Personal history of colonic polyps 07/15/2010   tubular adenoma  . Prostate cancer [facility])    patient denies  . PUD (peptic ulcer disease)   . Stomach cancer ([place])    NHL origin  . Stroke ([place])   . Weakness    Past Surgical History:  Past Surgical History:  Procedure Laterality Date  . APPENDECTOMY    . bone spur     left shoulder  . CHOLECYSTECTOMY    . KNEE ARTHROPLASTY Left 07/30/2015   Procedure: LEFT TOTAL KNEE ARTHROPLASTY WITH COMPUTER NAVIGATION;  Surgeon: Rod Can, MD;  Location: [facility] ORS;  Service: Orthopedics;  Laterality: Left;  . LASER OF PROSTATE W/ GREEN LIGHT PVP    . TENDON REPAIR     right arm  . TRIGGER FINGER RELEASE     bilateral hands  . WRIST SURGERY     Bilateral   HPI:  Patient is an 83 yo male who fell at home this morning while using his walker. He fell backwards into a piece of furniture and immediately felt pain in his left back and ribs. Patient is unsure of why he fell, thinks he may have just lost his footing. Patient denies hitting his head, LOC, dizziness, numbness/tingling, SOB, palpitations, blurred vision, abdominal pain,  nausea, vomiting.  Patient also having pain in left hand over thumb, able to bend and grip still.  Patient does take Eliquis, last dose was this AM. Workup in the ED showed fractures of 9-11 left ribs and small left hemopneumothorax, and T9 and T10 TP fractures, also non-displaced fx of T6 facet.    Assessment / Plan / Recommendation Clinical Impression  Patient with known dysphagia (07/06/16) had a fall at home resulting in fractures to ribs and injury to back and left arm. Pt in bed asleep when entering room. He woke easily and verbally agreed to swallow evaluation. Pt was repositioned in bed slowly to approximately 60-70 degrees. His pain and discomfort prohibited sitting up to 90 degrees. Pt's oral exam revealed a slight droop of left face at rest with no assymetry during functional task. Pt has normal dentition. He had a clear vocal quality but poor management of secretions below the glottis. Pt's cough was weak and unble to clear those secretions effectively (suspect due to recent rib fractures and shallow cough). Pt was presented with ice chips without noticable aspiration, however a tsp size of water resulted in an immediate cough and pt able to clear with yonker. Apple sauce and honey thickened liquid were presented by  teaspoon with no noticable signs of aspiration. Due to patients weak cough, history of dysphagia and Parkinson's, recommend a MBS to objectively evaluate his swallow. Spoke with nurse to update findings. Agreed for nurse to crush patients Parkinson's medication in applesauce (wanting patient to receive medication before swallow evaluation. SLP Visit Diagnosis: Dysphagia, unspecified (R13.10)    Aspiration Risk  Moderate aspiration risk    Diet Recommendation NPO except meds        Other  Recommendations Oral Care Recommendations: Oral care BID   Follow up Recommendations   MBS     Frequency and Duration            Prognosis Prognosis for Safe Diet Advancement: Fair       Swallow Study   General Date of Onset: 10/18/16 HPI: Patient is an 83 yo male who fell at home this morning while using his walker. He fell backwards into a piece of furniture and immediately felt pain in his left back and ribs. Patient is unsure of why he fell, thinks he may have just lost his footing. Patient denies hitting his head, LOC, dizziness, numbness/tingling, SOB, palpitations, blurred vision, abdominal pain, nausea, vomiting.  Patient also having pain in left hand over thumb, able to bend and grip still.  Patient does take Eliquis, last dose was this AM. Workup in the ED showed fractures of 9-11 left ribs and small left hemopneumothorax, and T9 and T10 TP fractures, also non-displaced fx of T6 facet.  Type of Study: Bedside Swallow Evaluation Previous Swallow Assessment: MBS 07/06/16 Diet Prior to this Study: NPO Temperature Spikes Noted: No Respiratory Status: Nasal cannula History of Recent Intubation: No Behavior/Cognition: Alert;Cooperative;Pleasant mood Oral Cavity Assessment: Within Functional Limits Oral Care Completed by SLP: No Oral Cavity - Dentition: Adequate natural dentition Vision: Functional for self-feeding Self-Feeding Abilities: Able to feed self Patient Positioning: Partially reclined;Postural control interferes with function Baseline Vocal Quality: Normal Volitional Cough: Weak Volitional Swallow: Able to elicit    Oral/Motor/Sensory Function Overall Oral Motor/Sensory Function: Within functional limits   Ice Chips Ice chips: Within functional limits Presentation: Spoon   Thin Liquid Thin Liquid: Impaired Presentation: Spoon Oral Phase Impairments: Poor awareness of bolus Oral Phase Functional Implications: Prolonged oral transit Pharyngeal  Phase Impairments: Suspected delayed Swallow;Decreased hyoid-laryngeal movement;Cough - Immediate    Nectar Thick Nectar Thick Liquid: Not tested   Honey Thick Honey Thick Liquid: Within functional  limits Presentation: Spoon   Puree Puree: Within functional limits Presentation: Spoon   Solid   GO   Solid: Not tested        Charlynne Cousins Colisha Redler, MA, CCC-SLP 10/20/2016 9:48 AM

## 2016-10-20 NOTE — Progress Notes (Signed)
Subjective: CC rib pain with cough  Objective: Vital signs in last 24 hours: Temp:  [97.8 F (36.6 C)-98.5 F (36.9 C)] 98.5 F (36.9 C) (07/19 0400) Pulse Rate:  [65-95] 65 (07/19 0600) Resp:  [14-29] 15 (07/19 0600) BP: (110-189)/(53-92) 114/55 (07/19 0600) SpO2:  [89 %-98 %] 93 % (07/19 0600) Weight:  [100.7 kg (222 lb)-101.1 kg (222 lb 14.2 oz)] 101.1 kg (222 lb 14.2 oz) (07/18 1838)    Intake/Output from previous day: 07/18 0701 - 07/19 0700 In: 1135 [I.V.:1135] Out: 330 [Urine:330] Intake/Output this shift: No intake/output data recorded.  General appearance: cooperative Resp: some rhonchi Chest wall: left sided chest wall tenderness Cardio: regular rate and rhythm GI: soft, NT Extremities: splint L hand  Lab Results: CBC   Recent Labs  10/19/16 1244 10/20/16 0516  WBC 9.3 8.8  HGB 12.0* 11.5*  HCT 37.9* 35.5*  PLT 158 162   BMET  Recent Labs  10/19/16 1244 10/20/16 0516  NA 134* 134*  K 4.3 4.4  CL 104 105  CO2 24 22  GLUCOSE 124* 123*  BUN 24* 23*  CREATININE 1.23 1.11  CALCIUM 8.8* 8.3*   PT/INR  Recent Labs  10/19/16 1638  LABPROT 16.8*  INR 1.35   ABG No results for input(s): PHART, HCO3 in the last 72 hours.  Invalid input(s): PCO2, PO2  Studies/Results: Dg Wrist Complete Left  Result Date: 10/19/2016 CLINICAL DATA:  Fall. EXAM: LEFT WRIST - COMPLETE 3+ VIEW COMPARISON:  No recent prior. FINDINGS: Diffuse degenerative change. Degenerative changes most prominent about the first carpometacarpal joint. Fracture of the trapezium cannot be excluded. Changes present may chest be degenerative. IMPRESSION: 1. Diffuse degenerative change. Degenerative changes most prominent about the first carpometacarpal joint. 2.  Fracture of the trapezium cannot be excluded . Electronically Signed   By: Marcello Moores  Register   On: 10/19/2016 13:27   Ct Chest Wo Contrast  Result Date: 10/19/2016 CLINICAL DATA:  Pt was walking with a walker, fell and hit  his thoracic spine on the corner of a dresser. Pt has bruising and abrasion to same. Pt is presently taking antibiotics for a respiratory infection, with anterior chest pain, congested cough EXAM: CT CHEST WITHOUT CONTRAST TECHNIQUE: Multidetector CT imaging of the chest was performed following the standard protocol without IV contrast. COMPARISON:  07/01/2016 FINDINGS: Cardiovascular: No significant vascular findings. Normal heart size. No pericardial effusion. Coronary artery atherosclerosis involving the left main, circumflex coronary artery is. Abdominal aortic atherosclerosis. Mediastinum/Nodes: Trachea, esophagus and thyroid gland are normal. No axillary lymphadenopathy. Enlarged subcarinal lymph node measuring 15 mm in short axis. Mildly enlarged lower paratracheal lymph node measuring 10 mm in short axis. Left hilar lymph node measuring 11 mm in short axis. Lungs/Pleura: Small left pneumothorax measuring less than 10%. No right pneumothorax. Small left hemothorax. Bilateral lower lobe chronic interstitial lung disease. Superimposed bilateral lower lobe airspace disease likely reflecting atelectasis. Upper Abdomen: No acute abnormality. Musculoskeletal: Comminuted fracture of the left posterior eleventh rib at the costovertebral junction. Comminuted fracture of the left posterior tenth rib and adjacent left T10 transverse process. Comminuted fracture of the left posterior tenth rib and adjacent left T9 transverse process. Nondisplaced fracture of the left inferior articulating facet of T6. IMPRESSION: 1. Small left pneumothorax measuring less than 10%. 2. Small left hemothorax. 3. Comminuted fracture of the left posterior eleventh rib at the costovertebral junction. Comminuted fracture of the left posterior tenth rib and adjacent left T10 transverse process. Comminuted fracture of the left posterior  tenth rib and adjacent left T9 transverse process. 4. Nondisplaced fracture of the left inferior articulating  facet of T6. 5. Bilateral lower lobe chronic interstitial disease with superimposed atelectasis. 6. Aortic Atherosclerosis (ICD10-I70.0). Critical Value/emergent results were called by telephone at the time of interpretation on 10/19/2016 at 3:13 pm to [facility] , who verbally acknowledged these results. Electronically Signed   By: Kathreen Devoid   On: 10/19/2016 15:13   Dg Pelvis Portable  Result Date: 10/20/2016 CLINICAL DATA:  Recent fall EXAM: PORTABLE PELVIS 1-2 VIEWS COMPARISON:  10/19/2016 FINDINGS: Osteopenia. No acute fracture. No dislocation. Mild vascular calcifications in the femoral arteries. IMPRESSION: No acute bony pathology. Electronically Signed   By: Marybelle Killings M.D.   On: 10/20/2016 07:36   Dg Pelvis Portable  Result Date: 10/19/2016 CLINICAL DATA:  Pain following fall EXAM: PORTABLE PELVIS 1-2 VIEWS COMPARISON:  None. FINDINGS: There is no fracture or dislocation. There is mild symmetric narrowing of both hip joints. No erosive change. IMPRESSION: Mild symmetric narrowing of both hip joints. No evident acute fracture or dislocation. Electronically Signed   By: Lowella Grip III M.D.   On: 10/19/2016 16:39   Ct T-spine No Charge  Result Date: 10/19/2016 CLINICAL DATA:  83 y/o M; fall with bruising and pain over the thoracic spine. EXAM: CT THORACIC SPINE WITHOUT CONTRAST TECHNIQUE: Multidetector CT images of the thoracic were obtained using the standard protocol without intravenous contrast. COMPARISON:  Concurrent CT chest. FINDINGS: Alignment: Normal. Vertebrae: Left T9 and T10 mildly displaced transverse process fractures. Question minimally displaced fracture of left T6 inferior articulating facet. No acute fracture or focal pathologic process. Paraspinal and other soft tissues: Left posterior 9 through 11 rib fractures near the rib heads with moderate displacement of fracture fragments. Small left pleural collection with increased attenuation probably representing hemothorax.  Trace pneumothorax. Diffuse patchy consolidation of lung bases and peribronchial thickening with mucous plugging compatible with bronchopneumonia. Disc levels: Moderate degenerative changes of the thoracic spine. IMPRESSION: 1. Left T9 and T10 mildly displaced transverse process fractures. 2. Left posterior 9-11 rib fractures near rib heads with comminution and moderate displacement of fracture fragments. 3. Men no displaced left T6 inferior facet fracture. 4. Small left pneumothorax.  Trace left pneumothorax. 5. Bilateral lower lobe bronchopneumonia. These results were called by telephone at the time of interpretation on 10/19/2016 at 3:29 pm to Dr. Shary Decamp , who verbally acknowledged these results. Electronically Signed   By: Kristine Garbe M.D.   On: 10/19/2016 15:35   Dg Chest Port 1 View  Result Date: 10/20/2016 CLINICAL DATA:  Chest pain.  Recent fall EXAM: PORTABLE CHEST 1 VIEW COMPARISON:  October 19, 2016 chest radiograph and chest CT FINDINGS: There is no appreciable pneumothorax. There is a small pleural effusion on the left with left lower lobe consolidation. There is right base atelectasis. There is cardiomegaly with pulmonary vascularity within normal limits. There is aortic atherosclerosis. Known rib fractures on the left are much better seen on recent CT. IMPRESSION: No pneumothorax currently evident. There is now airspace consolidation in the left lower lobe which appears increased compared to chest radiograph obtained 1 day prior. Question contusion versus pneumonia. Both entities may be present concurrently. Small left pleural effusion. Mild right base atelectasis. Stable cardiomegaly. Aortic atherosclerosis. Aortic Atherosclerosis (ICD10-I70.0). Electronically Signed   By: Lowella Grip III M.D.   On: 10/20/2016 07:58   Dg Chest Port 1 View  Result Date: 10/19/2016 CLINICAL DATA:  Pneumothorax on the left. EXAM:  PORTABLE CHEST 1 VIEW COMPARISON:  Chest CT from earlier today  FINDINGS: Trace left apical pneumothorax, less than 10%. Gas is likely underestimated due to positioning. No detected growth from prior. Chronic cardiomegaly and vascular pedicle widening, accentuated by rotation. Interstitial lung disease with low volume chest and diffuse coarse interstitial opacities. Known left rib fractures are not well visualized. IMPRESSION: 1. Trace left apical pneumothorax. No indication of increase compared to CT earlier today. 2. Chronic lung disease and cardiomegaly. Electronically Signed   By: Monte Fantasia M.D.   On: 10/19/2016 15:58    Anti-infectives: Anti-infectives    Start     Dose/Rate Route Frequency Ordered Stop   10/19/16 2200  amoxicillin (AMOXIL) capsule 500 mg     500 mg Oral Every 12 hours 10/19/16 1822        Assessment/Plan: Fall Mult L rib FX with PTX - PTX resolved on CXR today, add Ultram, pulm toilet and BDs  L trapezium FX - splint, hand surgery consult ? Dysphagia - ST to see, NPO except meds in apple sauce CV - resume home Eliquis VTE - Eliquis DIspo - to SDU, PT/OT    LOS: 0 days    Georganna Skeans, MD, MPH, FACS Trauma: [phone] General Surgery: [phone]  7/19/2018Patient ID: Jason [name], male   DOB: [birth year], 83 y.o.   MRN: [id number]

## 2016-10-21 ENCOUNTER — Inpatient Hospital Stay (HOSPITAL_COMMUNITY): Payer: Medicare Other

## 2016-10-21 MED ORDER — MUPIROCIN 2 % EX OINT: TOPICAL_OINTMENT | Freq: Two times a day (BID) | CUTANEOUS | Status: DC

## 2016-10-21 MED ORDER — CHLORHEXIDINE GLUCONATE CLOTH 2 % EX PADS
6.0000 | MEDICATED_PAD | Freq: Every day | CUTANEOUS | Status: AC
Start: 2016-10-21 — End: 2016-10-25
  Administered 2016-10-21 – 2016-10-25 (×5): 6 via TOPICAL

## 2016-10-21 MED ORDER — MORPHINE SULFATE (PF) 4 MG/ML IV SOLN
2.0000 mg | INTRAVENOUS | Status: DC | PRN
  Administered 2016-10-21 – 2016-10-24 (×4): 2 mg via INTRAVENOUS
  Filled 2016-10-21 (×4): qty 1

## 2016-10-21 MED ORDER — SODIUM CHLORIDE 0.9 % IV SOLN: INTRAVENOUS | Status: DC

## 2016-10-21 MED ORDER — MUPIROCIN 2 % EX OINT
1.0000 "application " | TOPICAL_OINTMENT | Freq: Two times a day (BID) | CUTANEOUS | Status: AC
  Administered 2016-10-21 – 2016-10-25 (×10): 1 via NASAL
  Filled 2016-10-21 (×2): qty 22

## 2016-10-21 NOTE — Evaluation (Signed)
Physical Therapy Evaluation Patient Details Name: Jason [name] MRN: [id number] DOB: 04-Nov-1927 Today's Date: 10/21/2016   History of Present Illness  83 yo admitted after ground level fall at home with Left 10,11 rib fx, T9-10 TVP fx, left trapezium fx. PMHx: non-Hodgkin lymphoma, CA (bladder, skin and prostate), CVA, Lt TKA, macular degeneration, Afib  Clinical Impression  Upon arrival daughter is present and pt lethargic; however agrees to mobilize with PT. Pt A & O x 4. Daughter states that the pt has had several falls in the past year and suggests he would need 24 hour assistance (does not currently have) and suggests SNF long term. Pt able to ambulate from bed to recliner chair with platform walker with VSS (HR staying in mid 80s and SpO2 in high (90s). Pt presents with deficits listed in PT problem list below and will benefit from continued acute therapy for mobilization, strengthening, increased activity tolerance and improved safety during functional tasks for safe d/c.       SNF;Supervision/Assistance - 24 hour    Equipment Recommendations  Other (comment) (platform for RW)    Recommendations for Other Services OT consult     Precautions / Restrictions Precautions Precautions: Fall Restrictions Weight Bearing Restrictions: Yes LUE Weight Bearing: Weight bear through elbow only Other Position/Activity Restrictions: No orders; assumed d/t spica splint      Mobility  Bed Mobility Overal bed mobility: Needs Assistance Bed Mobility: Supine to Sit     Supine to sit: Mod assist     General bed mobility comments: Mod A for rise and bringing LEs off bed. VCs for sequencing   Transfers Overall transfer level: Needs assistance   Transfers: Sit to/from Stand Sit to Stand: Min assist         General transfer comment: Min A for rise and VCs for hand placement onto platform walker   Ambulation/Gait Ambulation/Gait assistance: Min assist;+2 safety/equipment Ambulation  Distance (Feet): 10 Feet Assistive device: Left platform walker Gait Pattern/deviations: Shuffle;Trunk flexed;Decreased stride length Gait velocity: slowed Gait velocity interpretation: Below normal speed for age/gender General Gait Details: Min A for walker navigation; VCs for posturing and direction and cues for increasing step length   Stairs            Wheelchair Mobility    Modified Rankin (Stroke Patients Only)       Balance Overall balance assessment: Needs assistance   Sitting balance-Leahy Scale: Good       Standing balance-Leahy Scale: Poor                               Pertinent Vitals/Pain Pain Assessment: 0-10 Pain Score: 7  Pain Location: chest  Pain Descriptors / Indicators: Aching;Discomfort Pain Intervention(s): Limited activity within patient's tolerance;Monitored during session;Repositioned    Home Living Family/patient expects to be discharged to:: Private residence Living Arrangements: Alone Available Help at Discharge: Family;Available PRN/intermittently Type of Home: House (condo) Home Access: Level entry     Home Layout: One level Home Equipment: [place] - 2 wheels;Bedside commode;Shower seat      Prior Function Level of Independence: Needs assistance   Gait / Transfers Assistance Needed: used a RW  ADL's / Homemaking Assistance Needed: had some assist for meals and cleaning; daughter states pt able to make some meals for himself, such as making cereal for breakfast        Hand Dominance        Extremity/Trunk Assessment  Upper Extremity Assessment Upper Extremity Assessment: LUE deficits/detail LUE Deficits / Details: Lt trapezium fx     Lower Extremity Assessment Lower Extremity Assessment: Generalized weakness (Pt presents with flexion contracture of bil LEs )    Cervical / Trunk Assessment Cervical / Trunk Assessment: Kyphotic  Communication   Communication: No difficulties  Cognition  Arousal/Alertness: Lethargic Behavior During Therapy: Flat affect Overall Cognitive Status: Within Functional Limits for tasks assessed                                        General Comments      Exercises     Assessment/Plan    PT Assessment Patient needs continued PT services  PT Problem List Decreased strength;Decreased mobility;Decreased activity tolerance;Decreased balance;Decreased knowledge of use of DME;Pain;Decreased safety awareness       PT Treatment Interventions Gait training;Therapeutic exercise;Functional mobility training;Patient/family education;DME instruction;Therapeutic activities;Balance training    PT Goals (Current goals can be found in the Care Plan section)  Acute Rehab PT Goals Patient Stated Goal: be able to walk PT Goal Formulation: With patient/family Time For Goal Achievement: 11/04/16 Potential to Achieve Goals: Fair    Frequency Min 3X/week   Barriers to discharge Decreased caregiver support      Co-evaluation               AM-PAC PT "6 Clicks" Daily Activity  Outcome Measure Difficulty turning over in bed (including adjusting bedclothes, sheets and blankets)?: Total Difficulty moving from lying on back to sitting on the side of the bed? : Total Difficulty sitting down on and standing up from a chair with arms (e.g., wheelchair, bedside commode, etc,.)?: Total Help needed moving to and from a bed to chair (including a wheelchair)?: A Lot Help needed walking in hospital room?: A Lot Help needed climbing 3-5 steps with a railing? : Total 6 Click Score: 8    End of Session Equipment Utilized During Treatment: Gait belt Activity Tolerance: Patient tolerated treatment well Patient left: in chair;with call bell/phone within reach;with chair alarm set;with family/visitor present Nurse Communication: Mobility status;Precautions;Weight bearing status PT Visit Diagnosis: Unsteadiness on feet (R26.81);Difficulty in  walking, not elsewhere classified (R26.2);Muscle weakness (generalized) (M62.81);History of falling (Z91.81);Other abnormalities of gait and mobility (R26.89)    Time: 5498-2641 PT Time Calculation (min) (ACUTE ONLY): 29 min   Charges:   PT Evaluation $PT Eval Moderate Complexity: 1 Procedure PT Treatments $Therapeutic Activity: 8-22 mins   PT G CodesElberta [name], SPT Acute Rehab [place] 10/21/2016, 2:55 PM

## 2016-10-21 NOTE — Progress Notes (Signed)
Subjective: Patient reports mild back pain but manageable with pain medication. States that he feels "loopy" because of the pain meds. Doing well otherwise.   Objective: Vital signs in last 24 hours: Temp:  [98.9 F (37.2 C)-100.3 F (37.9 C)] 99.7 F (37.6 C) (07/20 0800) Pulse Rate:  [71-96] 73 (07/20 1000) Resp:  [17-28] 19 (07/20 1000) BP: (121-171)/(54-81) 122/54 (07/20 1000) SpO2:  [91 %-97 %] 92 % (07/20 1000)  Intake/Output from previous day: 07/19 0701 - 07/20 0700 In: 195 [I.V.:195] Out: 500 [Urine:500] Intake/Output this shift: Total I/O In: 310.8 [I.V.:310.8] Out: -   Neurologic: Grossly normal  Lab Results: Lab Results  Component Value Date   WBC 8.8 10/20/2016   HGB 11.5 (L) 10/20/2016   HCT 35.5 (L) 10/20/2016   MCV 86.2 10/20/2016   PLT 162 10/20/2016   Lab Results  Component Value Date   INR 1.35 10/19/2016   BMET Lab Results  Component Value Date   NA 134 (L) 10/20/2016   K 4.4 10/20/2016   CL 105 10/20/2016   CO2 22 10/20/2016   GLUCOSE 123 (H) 10/20/2016   BUN 23 (H) 10/20/2016   CREATININE 1.11 10/20/2016   CALCIUM 8.3 (L) 10/20/2016    Studies/Results: Dg Wrist Complete Left  Result Date: 10/19/2016 CLINICAL DATA:  Fall. EXAM: LEFT WRIST - COMPLETE 3+ VIEW COMPARISON:  No recent prior. FINDINGS: Diffuse degenerative change. Degenerative changes most prominent about the first carpometacarpal joint. Fracture of the trapezium cannot be excluded. Changes present may chest be degenerative. IMPRESSION: 1. Diffuse degenerative change. Degenerative changes most prominent about the first carpometacarpal joint. 2.  Fracture of the trapezium cannot be excluded . Electronically Signed   By: Marcello Moores  Register   On: 10/19/2016 13:27   Ct Chest Wo Contrast  Result Date: 10/19/2016 CLINICAL DATA:  Pt was walking with a walker, fell and hit his thoracic spine on the corner of a dresser. Pt has bruising and abrasion to same. Pt is presently taking  antibiotics for a respiratory infection, with anterior chest pain, congested cough EXAM: CT CHEST WITHOUT CONTRAST TECHNIQUE: Multidetector CT imaging of the chest was performed following the standard protocol without IV contrast. COMPARISON:  07/01/2016 FINDINGS: Cardiovascular: No significant vascular findings. Normal heart size. No pericardial effusion. Coronary artery atherosclerosis involving the left main, circumflex coronary artery is. Abdominal aortic atherosclerosis. Mediastinum/Nodes: Trachea, esophagus and thyroid gland are normal. No axillary lymphadenopathy. Enlarged subcarinal lymph node measuring 15 mm in short axis. Mildly enlarged lower paratracheal lymph node measuring 10 mm in short axis. Left hilar lymph node measuring 11 mm in short axis. Lungs/Pleura: Small left pneumothorax measuring less than 10%. No right pneumothorax. Small left hemothorax. Bilateral lower lobe chronic interstitial lung disease. Superimposed bilateral lower lobe airspace disease likely reflecting atelectasis. Upper Abdomen: No acute abnormality. Musculoskeletal: Comminuted fracture of the left posterior eleventh rib at the costovertebral junction. Comminuted fracture of the left posterior tenth rib and adjacent left T10 transverse process. Comminuted fracture of the left posterior tenth rib and adjacent left T9 transverse process. Nondisplaced fracture of the left inferior articulating facet of T6. IMPRESSION: 1. Small left pneumothorax measuring less than 10%. 2. Small left hemothorax. 3. Comminuted fracture of the left posterior eleventh rib at the costovertebral junction. Comminuted fracture of the left posterior tenth rib and adjacent left T10 transverse process. Comminuted fracture of the left posterior tenth rib and adjacent left T9 transverse process. 4. Nondisplaced fracture of the left inferior articulating facet of T6. 5. Bilateral lower  lobe chronic interstitial disease with superimposed atelectasis. 6. Aortic  Atherosclerosis (ICD10-I70.0). Critical Value/emergent results were called by telephone at the time of interpretation on 10/19/2016 at 3:13 pm to [facility] , who verbally acknowledged these results. Electronically Signed   By: Kathreen Devoid   On: 10/19/2016 15:13   Dg Pelvis Portable  Result Date: 10/20/2016 CLINICAL DATA:  Recent fall EXAM: PORTABLE PELVIS 1-2 VIEWS COMPARISON:  10/19/2016 FINDINGS: Osteopenia. No acute fracture. No dislocation. Mild vascular calcifications in the femoral arteries. IMPRESSION: No acute bony pathology. Electronically Signed   By: Marybelle Killings M.D.   On: 10/20/2016 07:36   Dg Pelvis Portable  Result Date: 10/19/2016 CLINICAL DATA:  Pain following fall EXAM: PORTABLE PELVIS 1-2 VIEWS COMPARISON:  None. FINDINGS: There is no fracture or dislocation. There is mild symmetric narrowing of both hip joints. No erosive change. IMPRESSION: Mild symmetric narrowing of both hip joints. No evident acute fracture or dislocation. Electronically Signed   By: Lowella Grip III M.D.   On: 10/19/2016 16:39   Ct T-spine No Charge  Result Date: 10/19/2016 CLINICAL DATA:  83 y/o M; fall with bruising and pain over the thoracic spine. EXAM: CT THORACIC SPINE WITHOUT CONTRAST TECHNIQUE: Multidetector CT images of the thoracic were obtained using the standard protocol without intravenous contrast. COMPARISON:  Concurrent CT chest. FINDINGS: Alignment: Normal. Vertebrae: Left T9 and T10 mildly displaced transverse process fractures. Question minimally displaced fracture of left T6 inferior articulating facet. No acute fracture or focal pathologic process. Paraspinal and other soft tissues: Left posterior 9 through 11 rib fractures near the rib heads with moderate displacement of fracture fragments. Small left pleural collection with increased attenuation probably representing hemothorax. Trace pneumothorax. Diffuse patchy consolidation of lung bases and peribronchial thickening with mucous  plugging compatible with bronchopneumonia. Disc levels: Moderate degenerative changes of the thoracic spine. IMPRESSION: 1. Left T9 and T10 mildly displaced transverse process fractures. 2. Left posterior 9-11 rib fractures near rib heads with comminution and moderate displacement of fracture fragments. 3. Men no displaced left T6 inferior facet fracture. 4. Small left pneumothorax.  Trace left pneumothorax. 5. Bilateral lower lobe bronchopneumonia. These results were called by telephone at the time of interpretation on 10/19/2016 at 3:29 pm to Dr. Shary Decamp , who verbally acknowledged these results. Electronically Signed   By: Kristine Garbe M.D.   On: 10/19/2016 15:35   Dg Chest Port 1 View  Result Date: 10/21/2016 CLINICAL DATA:  Follow-up pneumothorax on the left EXAM: PORTABLE CHEST 1 VIEW COMPARISON:  10/20/2016 FINDINGS: Cardiac shadow remains enlarged. Aortic calcifications are again seen. No definitive pneumothorax is noted. Persistent left retrocardiac consolidation is noted and stable. The right lung remains clear. IMPRESSION: Stable left retrocardiac consolidation.  No pneumothorax is seen. Electronically Signed   By: Inez Catalina M.D.   On: 10/21/2016 07:45   Dg Chest Port 1 View  Result Date: 10/20/2016 CLINICAL DATA:  Chest pain.  Recent fall EXAM: PORTABLE CHEST 1 VIEW COMPARISON:  October 19, 2016 chest radiograph and chest CT FINDINGS: There is no appreciable pneumothorax. There is a small pleural effusion on the left with left lower lobe consolidation. There is right base atelectasis. There is cardiomegaly with pulmonary vascularity within normal limits. There is aortic atherosclerosis. Known rib fractures on the left are much better seen on recent CT. IMPRESSION: No pneumothorax currently evident. There is now airspace consolidation in the left lower lobe which appears increased compared to chest radiograph obtained 1 day prior. Question contusion  versus pneumonia. Both entities  may be present concurrently. Small left pleural effusion. Mild right base atelectasis. Stable cardiomegaly. Aortic atherosclerosis. Aortic Atherosclerosis (ICD10-I70.0). Electronically Signed   By: Lowella Grip III M.D.   On: 10/20/2016 07:58   Dg Chest Port 1 View  Result Date: 10/19/2016 CLINICAL DATA:  Pneumothorax on the left. EXAM: PORTABLE CHEST 1 VIEW COMPARISON:  Chest CT from earlier today FINDINGS: Trace left apical pneumothorax, less than 10%. Gas is likely underestimated due to positioning. No detected growth from prior. Chronic cardiomegaly and vascular pedicle widening, accentuated by rotation. Interstitial lung disease with low volume chest and diffuse coarse interstitial opacities. Known left rib fractures are not well visualized. IMPRESSION: 1. Trace left apical pneumothorax. No indication of increase compared to CT earlier today. 2. Chronic lung disease and cardiomegaly. Electronically Signed   By: Monte Fantasia M.D.   On: 10/19/2016 15:58   Dg Swallowing Func-speech Pathology  Result Date: 10/20/2016 Objective Swallowing Evaluation: Type of Study: MBS-Modified Barium Swallow Study Patient Details Name: Jason [name] MRN: [id number] Date of Birth: December 18, 1927 Today's Date: 10/20/2016 Time: SLP Start Time (ACUTE ONLY): 1145-SLP Stop Time (ACUTE ONLY): 1200 SLP Time Calculation (min) (ACUTE ONLY): 15 min Past Medical History: Past Medical History: Diagnosis Date . Arthritis  . Barrett esophagus  . Bladder cancer ([place]) dx'd 5732,2025  surg only . Colon polyps   adenomatous . Difficulty sleeping   OCCASIONALLY . Diverticulosis of colon (without mention of hemorrhage)  . Frequency of urination  . GERD (gastroesophageal reflux disease)  . Hiatal hernia  . History of bladder cancer 1981  EXCISION ONLY . History of skin cancer   BASAL CELL . Hyperlipidemia  . Lymphoma, non-Hodgkin's ([place]) 2011  [place]  . Macular degeneration   LEFT EYE . Multiple nodules of lung 09/16/2011 . PAF  (paroxysmal atrial fibrillation) ([place])  . Personal history of colonic polyps 07/15/2010  tubular adenoma . Prostate cancer [facility])   patient denies . PUD (peptic ulcer disease)  . Stomach cancer ([place])   NHL origin . Stroke ([place])  . Weakness  Past Surgical History: Past Surgical History: Procedure Laterality Date . APPENDECTOMY   . bone spur    left shoulder . CHOLECYSTECTOMY   . KNEE ARTHROPLASTY Left 07/30/2015  Procedure: LEFT TOTAL KNEE ARTHROPLASTY WITH COMPUTER NAVIGATION;  Surgeon: Rod Can, MD;  Location: [facility] ORS;  Service: Orthopedics;  Laterality: Left; . LASER OF PROSTATE W/ GREEN LIGHT PVP   . TENDON REPAIR    right arm . TRIGGER FINGER RELEASE    bilateral hands . WRIST SURGERY    Bilateral HPI: Patient is an 83 yo male who fell at home this morning while using his walker. He fell backwards into a piece of furniture and immediately felt pain in his left back and ribs. Patient is unsure of why he fell, thinks he may have just lost his footing. Patient denies hitting his head, LOC, dizziness, numbness/tingling, SOB, palpitations, blurred vision, abdominal pain, nausea, vomiting.  Patient also having pain in left hand over thumb, able to bend and grip still.  Patient does take Eliquis, last dose was this AM. Workup in the ED showed fractures of 9-11 left ribs and small left hemopneumothorax, and T9 and T10 TP fractures, also non-displaced fx of T6 facet.  No Data Recorded Assessment / Plan / Recommendation CHL IP CLINICAL IMPRESSIONS 10/20/2016 Clinical Impression Patient presents with a moderate oropharyngeal phase dysphagia characterized by decreased oral bolus control and oral residuals due to  respiratory insufficiency, base of tongue weakness resulting in severe vallecular residuals and decreased laryngeal closure. Deep penetration of thin and nectar thick liquids noted both during initial swallow and post swallow due to combination of pooling oral and pharyngeal residuals. Although no penetration noted  with pureed solids or honey thick liquids, risk of decreased airway protection high as residuals mix with secretions. Recommend continuing NPO status excepts meds crushed in puree and ice chips for pleasure following thorough mouth care. SLP will f/u. Potential to advance diet good with improved respiratory function which will decrease aspiration risk.  SLP Visit Diagnosis Dysphagia, unspecified (R13.10) Attention and concentration deficit following -- Frontal lobe and executive function deficit following -- Impact on safety and function Moderate aspiration risk;Severe aspiration risk   CHL IP TREATMENT RECOMMENDATION 10/20/2016 Treatment Recommendations Therapy as outlined in treatment plan below   Prognosis 10/20/2016 Prognosis for Safe Diet Advancement Good Barriers to Reach Goals -- Barriers/Prognosis Comment -- CHL IP DIET RECOMMENDATION 10/20/2016 SLP Diet Recommendations NPO except meds;Ice chips PRN after oral care Liquid Administration via -- Medication Administration Crushed with puree Compensations -- Postural Changes --   CHL IP OTHER RECOMMENDATIONS 10/20/2016 Recommended Consults -- Oral Care Recommendations Oral care QID Other Recommendations --   CHL IP FOLLOW UP RECOMMENDATIONS 10/20/2016 Follow up Recommendations (No Data)   CHL IP FREQUENCY AND DURATION 10/20/2016 Speech Therapy Frequency (ACUTE ONLY) min 2x/week Treatment Duration 2 weeks      CHL IP ORAL PHASE 10/20/2016 Oral Phase Impaired Oral - Pudding Teaspoon -- Oral - Pudding Cup -- Oral - Honey Teaspoon Decreased bolus cohesion;Lingual/palatal residue Oral - Honey Cup -- Oral - Nectar Teaspoon Decreased bolus cohesion;Lingual/palatal residue Oral - Nectar Cup Decreased bolus cohesion;Lingual/palatal residue Oral - Nectar Straw -- Oral - Thin Teaspoon Decreased bolus cohesion;Lingual/palatal residue Oral - Thin Cup -- Oral - Thin Straw -- Oral - Puree WFL Oral - Mech Soft -- Oral - Regular -- Oral - Multi-Consistency -- Oral - Pill -- Oral Phase  - Comment --  CHL IP PHARYNGEAL PHASE 10/20/2016 Pharyngeal Phase Impaired Pharyngeal- Pudding Teaspoon -- Pharyngeal -- Pharyngeal- Pudding Cup -- Pharyngeal -- Pharyngeal- Honey Teaspoon Delayed swallow initiation-vallecula;Reduced tongue base retraction;Pharyngeal residue - valleculae Pharyngeal -- Pharyngeal- Honey Cup -- Pharyngeal -- Pharyngeal- Nectar Teaspoon Delayed swallow initiation-vallecula;Penetration/Aspiration during swallow;Penetration/Apiration after swallow;Pharyngeal residue - valleculae;Reduced tongue base retraction Pharyngeal Material enters airway, CONTACTS cords and not ejected out Pharyngeal- Nectar Cup Delayed swallow initiation-vallecula;Penetration/Aspiration during swallow;Penetration/Apiration after swallow;Pharyngeal residue - valleculae;Reduced tongue base retraction Pharyngeal Material enters airway, CONTACTS cords and not ejected out Pharyngeal- Nectar Straw -- Pharyngeal -- Pharyngeal- Thin Teaspoon Delayed swallow initiation-vallecula;Penetration/Aspiration during swallow;Penetration/Apiration after swallow;Pharyngeal residue - valleculae;Reduced tongue base retraction Pharyngeal Material enters airway, CONTACTS cords and not ejected out Pharyngeal- Thin Cup -- Pharyngeal -- Pharyngeal- Thin Straw -- Pharyngeal -- Pharyngeal- Puree Delayed swallow initiation-vallecula;Reduced tongue base retraction;Pharyngeal residue - valleculae Pharyngeal -- Pharyngeal- Mechanical Soft -- Pharyngeal -- Pharyngeal- Regular -- Pharyngeal -- Pharyngeal- Multi-consistency -- Pharyngeal -- Pharyngeal- Pill -- Pharyngeal -- Pharyngeal Comment --  CHL IP CERVICAL ESOPHAGEAL PHASE 10/20/2016 Cervical Esophageal Phase WFL Pudding Teaspoon -- Pudding Cup -- Honey Teaspoon -- Honey Cup -- Nectar Teaspoon -- Nectar Cup -- Nectar Straw -- Thin Teaspoon -- Thin Cup -- Thin Straw -- Puree -- Mechanical Soft -- Regular -- Multi-consistency -- Pill -- Cervical Esophageal Comment -- CHL IP GO 10/20/2016 Functional  Assessment Tool Used skilled clinical judgement Functional Limitations Swallowing Swallow Current Status (Y5638) CL Swallow Goal Status (L3734)  [place] Discharge Status [phone]) (None) Motor Speech Current Status [phone]) (None) Motor Speech Goal Status [phone] (None) Motor Speech Goal Status [phone]) (None) Spoken Language Comprehension Current Status [phone]) (None) Spoken Language Comprehension Goal Status (D5686) (None) Spoken Language Comprehension Discharge Status [phone]) (None) Spoken Language Expression Current Status [phone]) (None) Spoken Language Expression Goal Status [phone]) (None) Spoken Language Expression Discharge Status [phone]) (None) Attention Current Status (V3612) (None) Attention Goal Status (A4497) (None) Attention Discharge Status [phone]) (None) Memory Current Status (R1021) (None) Memory Goal Status (R1735) (None) Memory Discharge Status (A7014) (None) Voice Current Status (D0301) (None) Voice Goal Status (T1438) (None) Voice Discharge Status (O8757) (None) Other Speech-Language Pathology Functional Limitation Current Status (V7282) (None) Other Speech-Language Pathology Functional Limitation Goal Status (S6015) (None) Other Speech-Language Pathology Functional Limitation Discharge Status [phone]) (None) Gabriel Rainwater MA, CCC-SLP [phone] [place] Leah Meryl 10/20/2016, 1:32 PM               Assessment/Plan: Continue pain management. Ok to move to floor from our perspective but will leave that up to Trauma as they are the attending.    LOS: 1 day    Ocie Cornfield Meyran 10/21/2016, 11:09 AM

## 2016-10-21 NOTE — Progress Notes (Signed)
  Speech Language Pathology Treatment: Dysphagia  Patient Details Name: Jason [name] MRN: [id number] DOB: 1928-01-15 Today's Date: 10/21/2016 Time: 1510-1550 SLP Time Calculation (min) (ACUTE ONLY): 40 min  Assessment / Plan / Recommendation Clinical Impression  SLP provided assistance with oral care and administered ice chip trials. This elicited strong cough responses which were effective at producing moderate amounts of thick secretions. His coughing otherwise is guarded and not productive. PO trials were limited today in part due to pt's lethargy, which was feltt o be medication induced (pt had just received morphine). SLP reviewed video from [place] on previous date with the pt's daughter and provided thorough education about current swallowing status and risk for aspiration. SLP also reviewed swallowing exercises, all of which pt had done previously after his stroke, so that his daughters can help him with his strength rebuilding. Recommend that pt remain NPO except for meds crushed in puree and ice chips after oral care. Pt will likely need a few more days of strengthening before he is ready to repeat his MBS. He may benefit from temporary, alternative means of nutrition in the mean time.    HPI HPI: Patient is an 83 yo male who fell at home this morning while using his walker. He fell backwards into a piece of furniture and immediately felt pain in his left back and ribs. Patient is unsure of why he fell, thinks he may have just lost his footing. Patient denies hitting his head, LOC, dizziness, numbness/tingling, SOB, palpitations, blurred vision, abdominal pain, nausea, vomiting.  Patient also having pain in left hand over thumb, able to bend and grip still.  Patient does take Eliquis, last dose was this AM. Workup in the ED showed fractures of 9-11 left ribs and small left hemopneumothorax, and T9 and T10 TP fractures, also non-displaced fx of T6 facet.       SLP Plan  Continue with current  plan of care       Recommendations  Diet recommendations: NPO;Other(comment) (ice chips okay after oral care) Medication Administration: Crushed with puree                Oral Care Recommendations: Oral care QID;Oral care prior to ice chip/H20 Follow up Recommendations: Skilled Nursing facility SLP Visit Diagnosis: Dysphagia, unspecified (R13.10) Plan: Continue with current plan of care       GO                Jason [name] 10/21/2016, 4:27 PM  Jason [name], M.A. CCC-SLP [phone]

## 2016-10-21 NOTE — Care Management Note (Signed)
Case Management Note  Patient Details  Name: LYNDEN FLEMMER MRN: [id number] Date of Birth: [birth year]  Subjective/Objective:  Pt admitted on 10/19/16 s/p ground level fall with multiple Lt rib fx with PTX, Lt trapezium fx, and thoracic spine fractures.  PTA, pt resides at home alone; uses RW to ambulate.                 Action/Plan: PT/OT evaluations pending.  Will follow for discharge planning as pt progresses.    Expected Discharge Date:                  Expected Discharge Plan:  Skilled Nursing Facility  In-House Referral:  Clinical Social Work  Discharge planning Services     Post Acute Care Choice:    Choice offered to:     DME Arranged:    DME Agency:     HH Arranged:    [place] Agency:     Status of Service:  In process, will continue to follow  If discussed at Long Length of Stay Meetings, dates discussed:    Additional Comments:  10/21/16 J. Donatello Kleve, RN, BSN PT recommending SNF for rehab at discharge.  Referral to CSW to facilitate possible dc to SNF upon medical stability.  Will follow.    Reinaldo Raddle, RN, BSN  Trauma/Neuro ICU Case Manager [phone]

## 2016-10-21 NOTE — Progress Notes (Signed)
Trauma Service Note  Subjective: Patient able to communicate with me today.  Daughter at the bedside  Objective: Vital signs in last 24 hours: Temp:  [98.9 F (37.2 C)-100.3 F (37.9 C)] 99.7 F (37.6 C) (07/20 0800) Pulse Rate:  [71-96] 73 (07/20 1000) Resp:  [17-28] 19 (07/20 1000) BP: (121-171)/(54-81) 122/54 (07/20 1000) SpO2:  [91 %-97 %] 92 % (07/20 1000)    Intake/Output from previous day: 07/19 0701 - 07/20 0700 In: 195 [I.V.:195] Out: 500 [Urine:500] Intake/Output this shift: Total I/O In: 310.8 [I.V.:310.8] Out: -   General: No distress.  Coughing a lot currently.  Has left chest pain.  Lungs: Some rhonchi bilaterally and upper airway gurgling.  Able to get IS up to 750  Abd: Soft, good bowel sounds.  Extremities: No changes  Neuro: Seems to be intact  Lab Results: CBC   Recent Labs  10/19/16 1244 10/20/16 0516  WBC 9.3 8.8  HGB 12.0* 11.5*  HCT 37.9* 35.5*  PLT 158 162   BMET  Recent Labs  10/19/16 1244 10/20/16 0516  NA 134* 134*  K 4.3 4.4  CL 104 105  CO2 24 22  GLUCOSE 124* 123*  BUN 24* 23*  CREATININE 1.23 1.11  CALCIUM 8.8* 8.3*   PT/INR  Recent Labs  10/19/16 1638  LABPROT 16.8*  INR 1.35   ABG No results for input(s): PHART, HCO3 in the last 72 hours.  Invalid input(s): PCO2, PO2  Studies/Results: Dg Wrist Complete Left  Result Date: 10/19/2016 CLINICAL DATA:  Fall. EXAM: LEFT WRIST - COMPLETE 3+ VIEW COMPARISON:  No recent prior. FINDINGS: Diffuse degenerative change. Degenerative changes most prominent about the first carpometacarpal joint. Fracture of the trapezium cannot be excluded. Changes present may chest be degenerative. IMPRESSION: 1. Diffuse degenerative change. Degenerative changes most prominent about the first carpometacarpal joint. 2.  Fracture of the trapezium cannot be excluded . Electronically Signed   By: Marcello Moores  Register   On: 10/19/2016 13:27   Ct Chest Wo Contrast  Result Date:  10/19/2016 CLINICAL DATA:  Pt was walking with a walker, fell and hit his thoracic spine on the corner of a dresser. Pt has bruising and abrasion to same. Pt is presently taking antibiotics for a respiratory infection, with anterior chest pain, congested cough EXAM: CT CHEST WITHOUT CONTRAST TECHNIQUE: Multidetector CT imaging of the chest was performed following the standard protocol without IV contrast. COMPARISON:  07/01/2016 FINDINGS: Cardiovascular: No significant vascular findings. Normal heart size. No pericardial effusion. Coronary artery atherosclerosis involving the left main, circumflex coronary artery is. Abdominal aortic atherosclerosis. Mediastinum/Nodes: Trachea, esophagus and thyroid gland are normal. No axillary lymphadenopathy. Enlarged subcarinal lymph node measuring 15 mm in short axis. Mildly enlarged lower paratracheal lymph node measuring 10 mm in short axis. Left hilar lymph node measuring 11 mm in short axis. Lungs/Pleura: Small left pneumothorax measuring less than 10%. No right pneumothorax. Small left hemothorax. Bilateral lower lobe chronic interstitial lung disease. Superimposed bilateral lower lobe airspace disease likely reflecting atelectasis. Upper Abdomen: No acute abnormality. Musculoskeletal: Comminuted fracture of the left posterior eleventh rib at the costovertebral junction. Comminuted fracture of the left posterior tenth rib and adjacent left T10 transverse process. Comminuted fracture of the left posterior tenth rib and adjacent left T9 transverse process. Nondisplaced fracture of the left inferior articulating facet of T6. IMPRESSION: 1. Small left pneumothorax measuring less than 10%. 2. Small left hemothorax. 3. Comminuted fracture of the left posterior eleventh rib at the costovertebral junction. Comminuted fracture of  the left posterior tenth rib and adjacent left T10 transverse process. Comminuted fracture of the left posterior tenth rib and adjacent left T9 transverse  process. 4. Nondisplaced fracture of the left inferior articulating facet of T6. 5. Bilateral lower lobe chronic interstitial disease with superimposed atelectasis. 6. Aortic Atherosclerosis (ICD10-I70.0). Critical Value/emergent results were called by telephone at the time of interpretation on 10/19/2016 at 3:13 pm to [facility] , who verbally acknowledged these results. Electronically Signed   By: Kathreen Devoid   On: 10/19/2016 15:13   Dg Pelvis Portable  Result Date: 10/20/2016 CLINICAL DATA:  Recent fall EXAM: PORTABLE PELVIS 1-2 VIEWS COMPARISON:  10/19/2016 FINDINGS: Osteopenia. No acute fracture. No dislocation. Mild vascular calcifications in the femoral arteries. IMPRESSION: No acute bony pathology. Electronically Signed   By: Marybelle Killings M.D.   On: 10/20/2016 07:36   Dg Pelvis Portable  Result Date: 10/19/2016 CLINICAL DATA:  Pain following fall EXAM: PORTABLE PELVIS 1-2 VIEWS COMPARISON:  None. FINDINGS: There is no fracture or dislocation. There is mild symmetric narrowing of both hip joints. No erosive change. IMPRESSION: Mild symmetric narrowing of both hip joints. No evident acute fracture or dislocation. Electronically Signed   By: Lowella Grip III M.D.   On: 10/19/2016 16:39   Ct T-spine No Charge  Result Date: 10/19/2016 CLINICAL DATA:  83 y/o M; fall with bruising and pain over the thoracic spine. EXAM: CT THORACIC SPINE WITHOUT CONTRAST TECHNIQUE: Multidetector CT images of the thoracic were obtained using the standard protocol without intravenous contrast. COMPARISON:  Concurrent CT chest. FINDINGS: Alignment: Normal. Vertebrae: Left T9 and T10 mildly displaced transverse process fractures. Question minimally displaced fracture of left T6 inferior articulating facet. No acute fracture or focal pathologic process. Paraspinal and other soft tissues: Left posterior 9 through 11 rib fractures near the rib heads with moderate displacement of fracture fragments. Small left pleural  collection with increased attenuation probably representing hemothorax. Trace pneumothorax. Diffuse patchy consolidation of lung bases and peribronchial thickening with mucous plugging compatible with bronchopneumonia. Disc levels: Moderate degenerative changes of the thoracic spine. IMPRESSION: 1. Left T9 and T10 mildly displaced transverse process fractures. 2. Left posterior 9-11 rib fractures near rib heads with comminution and moderate displacement of fracture fragments. 3. Men no displaced left T6 inferior facet fracture. 4. Small left pneumothorax.  Trace left pneumothorax. 5. Bilateral lower lobe bronchopneumonia. These results were called by telephone at the time of interpretation on 10/19/2016 at 3:29 pm to Dr. Shary Decamp , who verbally acknowledged these results. Electronically Signed   By: Kristine Garbe M.D.   On: 10/19/2016 15:35   Dg Chest Port 1 View  Result Date: 10/21/2016 CLINICAL DATA:  Follow-up pneumothorax on the left EXAM: PORTABLE CHEST 1 VIEW COMPARISON:  10/20/2016 FINDINGS: Cardiac shadow remains enlarged. Aortic calcifications are again seen. No definitive pneumothorax is noted. Persistent left retrocardiac consolidation is noted and stable. The right lung remains clear. IMPRESSION: Stable left retrocardiac consolidation.  No pneumothorax is seen. Electronically Signed   By: Inez Catalina M.D.   On: 10/21/2016 07:45   Dg Chest Port 1 View  Result Date: 10/20/2016 CLINICAL DATA:  Chest pain.  Recent fall EXAM: PORTABLE CHEST 1 VIEW COMPARISON:  October 19, 2016 chest radiograph and chest CT FINDINGS: There is no appreciable pneumothorax. There is a small pleural effusion on the left with left lower lobe consolidation. There is right base atelectasis. There is cardiomegaly with pulmonary vascularity within normal limits. There is aortic atherosclerosis. Known rib  fractures on the left are much better seen on recent CT. IMPRESSION: No pneumothorax currently evident. There is  now airspace consolidation in the left lower lobe which appears increased compared to chest radiograph obtained 1 day prior. Question contusion versus pneumonia. Both entities may be present concurrently. Small left pleural effusion. Mild right base atelectasis. Stable cardiomegaly. Aortic atherosclerosis. Aortic Atherosclerosis (ICD10-I70.0). Electronically Signed   By: Lowella Grip III M.D.   On: 10/20/2016 07:58   Dg Chest Port 1 View  Result Date: 10/19/2016 CLINICAL DATA:  Pneumothorax on the left. EXAM: PORTABLE CHEST 1 VIEW COMPARISON:  Chest CT from earlier today FINDINGS: Trace left apical pneumothorax, less than 10%. Gas is likely underestimated due to positioning. No detected growth from prior. Chronic cardiomegaly and vascular pedicle widening, accentuated by rotation. Interstitial lung disease with low volume chest and diffuse coarse interstitial opacities. Known left rib fractures are not well visualized. IMPRESSION: 1. Trace left apical pneumothorax. No indication of increase compared to CT earlier today. 2. Chronic lung disease and cardiomegaly. Electronically Signed   By: Monte Fantasia M.D.   On: 10/19/2016 15:58   Dg Swallowing Func-speech Pathology  Result Date: 10/20/2016 Objective Swallowing Evaluation: Type of Study: MBS-Modified Barium Swallow Study Patient Details Name: Jason [name] MRN: [id number] Date of Birth: Oct 16, 1927 Today's Date: 10/20/2016 Time: SLP Start Time (ACUTE ONLY): 1145-SLP Stop Time (ACUTE ONLY): 1200 SLP Time Calculation (min) (ACUTE ONLY): 15 min Past Medical History: Past Medical History: Diagnosis Date . Arthritis  . Barrett esophagus  . Bladder cancer ([place]) dx'd 8099,8338  surg only . Colon polyps   adenomatous . Difficulty sleeping   OCCASIONALLY . Diverticulosis of colon (without mention of hemorrhage)  . Frequency of urination  . GERD (gastroesophageal reflux disease)  . Hiatal hernia  . History of bladder cancer 1981  EXCISION ONLY . History of skin  cancer   BASAL CELL . Hyperlipidemia  . Lymphoma, non-Hodgkin's ([place]) 2011  [place]  . Macular degeneration   LEFT EYE . Multiple nodules of lung 09/16/2011 . PAF (paroxysmal atrial fibrillation) ([place])  . Personal history of colonic polyps 07/15/2010  tubular adenoma . Prostate cancer [facility])   patient denies . PUD (peptic ulcer disease)  . Stomach cancer ([place])   NHL origin . Stroke ([place])  . Weakness  Past Surgical History: Past Surgical History: Procedure Laterality Date . APPENDECTOMY   . bone spur    left shoulder . CHOLECYSTECTOMY   . KNEE ARTHROPLASTY Left 07/30/2015  Procedure: LEFT TOTAL KNEE ARTHROPLASTY WITH COMPUTER NAVIGATION;  Surgeon: Rod Can, MD;  Location: [facility] ORS;  Service: Orthopedics;  Laterality: Left; . LASER OF PROSTATE W/ GREEN LIGHT PVP   . TENDON REPAIR    right arm . TRIGGER FINGER RELEASE    bilateral hands . WRIST SURGERY    Bilateral HPI: Patient is an 83 yo male who fell at home this morning while using his walker. He fell backwards into a piece of furniture and immediately felt pain in his left back and ribs. Patient is unsure of why he fell, thinks he may have just lost his footing. Patient denies hitting his head, LOC, dizziness, numbness/tingling, SOB, palpitations, blurred vision, abdominal pain, nausea, vomiting.  Patient also having pain in left hand over thumb, able to bend and grip still.  Patient does take Eliquis, last dose was this AM. Workup in the ED showed fractures of 9-11 left ribs and small left hemopneumothorax, and T9 and T10 TP fractures, also non-displaced  fx of T6 facet.  No Data Recorded Assessment / Plan / Recommendation CHL IP CLINICAL IMPRESSIONS 10/20/2016 Clinical Impression Patient presents with a moderate oropharyngeal phase dysphagia characterized by decreased oral bolus control and oral residuals due to respiratory insufficiency, base of tongue weakness resulting in severe vallecular residuals and decreased laryngeal closure. Deep penetration of  thin and nectar thick liquids noted both during initial swallow and post swallow due to combination of pooling oral and pharyngeal residuals. Although no penetration noted with pureed solids or honey thick liquids, risk of decreased airway protection high as residuals mix with secretions. Recommend continuing NPO status excepts meds crushed in puree and ice chips for pleasure following thorough mouth care. SLP will f/u. Potential to advance diet good with improved respiratory function which will decrease aspiration risk.  SLP Visit Diagnosis Dysphagia, unspecified (R13.10) Attention and concentration deficit following -- Frontal lobe and executive function deficit following -- Impact on safety and function Moderate aspiration risk;Severe aspiration risk   CHL IP TREATMENT RECOMMENDATION 10/20/2016 Treatment Recommendations Therapy as outlined in treatment plan below   Prognosis 10/20/2016 Prognosis for Safe Diet Advancement Good Barriers to Reach Goals -- Barriers/Prognosis Comment -- CHL IP DIET RECOMMENDATION 10/20/2016 SLP Diet Recommendations NPO except meds;Ice chips PRN after oral care Liquid Administration via -- Medication Administration Crushed with puree Compensations -- Postural Changes --   CHL IP OTHER RECOMMENDATIONS 10/20/2016 Recommended Consults -- Oral Care Recommendations Oral care QID Other Recommendations --   CHL IP FOLLOW UP RECOMMENDATIONS 10/20/2016 Follow up Recommendations (No Data)   CHL IP FREQUENCY AND DURATION 10/20/2016 Speech Therapy Frequency (ACUTE ONLY) min 2x/week Treatment Duration 2 weeks      CHL IP ORAL PHASE 10/20/2016 Oral Phase Impaired Oral - Pudding Teaspoon -- Oral - Pudding Cup -- Oral - Honey Teaspoon Decreased bolus cohesion;Lingual/palatal residue Oral - Honey Cup -- Oral - Nectar Teaspoon Decreased bolus cohesion;Lingual/palatal residue Oral - Nectar Cup Decreased bolus cohesion;Lingual/palatal residue Oral - Nectar Straw -- Oral - Thin Teaspoon Decreased bolus  cohesion;Lingual/palatal residue Oral - Thin Cup -- Oral - Thin Straw -- Oral - Puree WFL Oral - Mech Soft -- Oral - Regular -- Oral - Multi-Consistency -- Oral - Pill -- Oral Phase - Comment --  CHL IP PHARYNGEAL PHASE 10/20/2016 Pharyngeal Phase Impaired Pharyngeal- Pudding Teaspoon -- Pharyngeal -- Pharyngeal- Pudding Cup -- Pharyngeal -- Pharyngeal- Honey Teaspoon Delayed swallow initiation-vallecula;Reduced tongue base retraction;Pharyngeal residue - valleculae Pharyngeal -- Pharyngeal- Honey Cup -- Pharyngeal -- Pharyngeal- Nectar Teaspoon Delayed swallow initiation-vallecula;Penetration/Aspiration during swallow;Penetration/Apiration after swallow;Pharyngeal residue - valleculae;Reduced tongue base retraction Pharyngeal Material enters airway, CONTACTS cords and not ejected out Pharyngeal- Nectar Cup Delayed swallow initiation-vallecula;Penetration/Aspiration during swallow;Penetration/Apiration after swallow;Pharyngeal residue - valleculae;Reduced tongue base retraction Pharyngeal Material enters airway, CONTACTS cords and not ejected out Pharyngeal- Nectar Straw -- Pharyngeal -- Pharyngeal- Thin Teaspoon Delayed swallow initiation-vallecula;Penetration/Aspiration during swallow;Penetration/Apiration after swallow;Pharyngeal residue - valleculae;Reduced tongue base retraction Pharyngeal Material enters airway, CONTACTS cords and not ejected out Pharyngeal- Thin Cup -- Pharyngeal -- Pharyngeal- Thin Straw -- Pharyngeal -- Pharyngeal- Puree Delayed swallow initiation-vallecula;Reduced tongue base retraction;Pharyngeal residue - valleculae Pharyngeal -- Pharyngeal- Mechanical Soft -- Pharyngeal -- Pharyngeal- Regular -- Pharyngeal -- Pharyngeal- Multi-consistency -- Pharyngeal -- Pharyngeal- Pill -- Pharyngeal -- Pharyngeal Comment --  CHL IP CERVICAL ESOPHAGEAL PHASE 10/20/2016 Cervical Esophageal Phase WFL Pudding Teaspoon -- Pudding Cup -- Honey Teaspoon -- Honey Cup -- Nectar Teaspoon -- Nectar Cup -- Nectar  Straw -- Thin Teaspoon -- Thin Cup -- Thin Straw --  Puree -- Mechanical Soft -- Regular -- Multi-consistency -- Pill -- Cervical Esophageal Comment -- CHL IP GO 10/20/2016 Functional Assessment Tool Used skilled clinical judgement Functional Limitations Swallowing Swallow Current Status (S2548) CL Swallow Goal Status (Y2824) [place] Swallow Discharge Status (J7530) (None) Motor Speech Current Status (Z0404) (None) Motor Speech Goal Status (B9136) (None) Motor Speech Goal Status (U5992) (None) Spoken Language Comprehension Current Status (F4144) (None) Spoken Language Comprehension Goal Status (H6016) (None) Spoken Language Comprehension Discharge Status [phone]) (None) Spoken Language Expression Current Status [phone]) (None) Spoken Language Expression Goal Status (S4739) (None) Spoken Language Expression Discharge Status [phone]) (None) Attention Current Status (N1278) (None) Attention Goal Status (N1836) (None) Attention Discharge Status (D2550) (None) Memory Current Status (I1642) (None) Memory Goal Status (X0379) (None) Memory Discharge Status (D5831) (None) Voice Current Status (A7425) (None) Voice Goal Status (L2589) (None) Voice Discharge Status (U8347) (None) Other Speech-Language Pathology Functional Limitation Current Status (H8307) (None) Other Speech-Language Pathology Functional Limitation Goal Status (O6002) (None) Other Speech-Language Pathology Functional Limitation Discharge Status [phone]) (None) Gabriel Rainwater MA, CCC-SLP [phone] McCoy Leah Meryl 10/20/2016, 1:32 PM               Anti-infectives: Anti-infectives    Start     Dose/Rate Route Frequency Ordered Stop   10/19/16 2200  amoxicillin (AMOXIL) capsule 500 mg     500 mg Oral Every 12 hours 10/19/16 1822        Assessment/Plan: s/p  Send sputum for spedimen  Speech swallowing evaluation OT/PT also  LOS: 1 day   Kathryne Eriksson. Dahlia Bailiff, MD, FACS [phone] Trauma Surgeon 10/21/2016

## 2016-10-22 ENCOUNTER — Inpatient Hospital Stay (HOSPITAL_COMMUNITY): Payer: Medicare Other

## 2016-10-22 LAB — GLUCOSE, CAPILLARY
Glucose-Capillary: 113 mg/dL — ABNORMAL HIGH (ref 65–99)
Glucose-Capillary: 132 mg/dL — ABNORMAL HIGH (ref 65–99)
Glucose-Capillary: 136 mg/dL — ABNORMAL HIGH (ref 65–99)

## 2016-10-22 MED ORDER — JEVITY 1.2 CAL PO LIQD
1000.0000 mL | ORAL | Status: DC
  Filled 2016-10-22 (×2): qty 1000

## 2016-10-22 MED ORDER — JEVITY 1.2 CAL PO LIQD
1000.0000 mL | ORAL | Status: DC
  Administered 2016-10-22: 25 mL
  Administered 2016-10-24 – 2016-10-26 (×4): 1000 mL
  Filled 2016-10-22 (×12): qty 1000

## 2016-10-22 MED ORDER — PRO-STAT SUGAR FREE PO LIQD
30.0000 mL | Freq: Every day | ORAL | Status: DC
  Administered 2016-10-22 – 2016-10-27 (×6): 30 mL
  Filled 2016-10-22 (×6): qty 30

## 2016-10-22 NOTE — Progress Notes (Signed)
Nutrition Follow-up  DOCUMENTATION CODES:   Obesity unspecified  INTERVENTION:  - Will order Jevity 1.2 @ 25 mL/hr to increase by 10 mL every 4 hours to goal rate of Jevity 1.2 @ 65 mL/hr. - Will order 30 mL Prostat once/day.  - RD will continue to monitor for need to adjust orders and ability for diet advancement in the future.   NUTRITION DIAGNOSIS:   Inadequate oral intake related to dysphagia as evidenced by NPO status. -ongoing  GOAL:   Patient will meet greater than or equal to 90% of their needs -unmet at this time.   MONITOR:   TF tolerance, Weight trends, Labs  REASON FOR ASSESSMENT:   Consult Enteral/tube feeding initiation and management  ASSESSMENT:   Pt with PMH of hiatal hernias, PAF, newly dx Parkinson's, and dysphagia admitted after a fall with multiple L rib fx and PTX (resloved), L trapezium fx (splint).   7/21 Consult received for TF and consult also placed for Cortrak tube; tube not yet in place. SLP saw pt for MBS on 7/19 and recommended NPO and bedside swallow yesterday also recommended NPO. No new weight since 10/19/16. Estimated needs from 7/19 remain appropriate.  TF recommendation on 7/19 was for 30 mL Prostat once/day with Jevity 1.2 @ 25 mL/hr to increase by 10 mL every 4 hours to reach goal rate of Jevity 1.2 @ 65 mL/hr. This regimen would provide 1972 kcal, 102 grams of protein, and 1258 mL free water. Order currently in place for Jevity 1.2 @ 50 mL/hr which would provide 1440 kcal, 67 grams protein, 968 mL free water.   Medications reviewed; 100 mg Colace/day, 40 mg Protonix/day, 1 tablet Senokot/day.  Labs reviewed; Na: 134 mmol/L, BUN: 23 mg/dL, Ca: 8.3 mg/dL.  IVF: D5-1/2 NS-20 mEq KCl @ 50 mL/hr (204 kcal from dextrose).    7/19 - Pt discussed during ICU rounds and with RN.  - Spoke with pt and his daughter.  - Pt lives alone with help from his daughter.  - She cooks his lunch/dinner or they go out.  - He has had a good appetite and  had recovered from his dysphagia. - Daughter feels pt needs more help at home.  - He has had no recent weight loss.  - Pt failed swallow eval today.  - Pt has had a short term feeding tube in the past and daughter open to possibility if needed.   Diet Order:  Diet NPO time specified Except for: Sips with Meds  Skin:  Reviewed, no issues  Last BM:  PTA/unknown  Height:   Ht Readings from Last 1 Encounters:  10/19/16 5\' 10"  (1.778 m)    Weight:   Wt Readings from Last 1 Encounters:  10/19/16 222 lb 14.2 oz (101.1 kg)    Ideal Body Weight:  75.4 kg  BMI:  Body mass index is 31.98 kg/m.  Estimated Nutritional Needs:   Kcal:  1800-2000  Protein:  90-115 grams  Fluid:  > 2 L/day  EDUCATION NEEDS:   No education needs identified at this time    Jarome Matin, MS, RD, LDN, CNSC Inpatient Clinical Dietitian Pager # [phone] After hours/weekend pager # [phone]

## 2016-10-22 NOTE — Progress Notes (Signed)
  Subjective: CC did not pass swallow, has been having a lot of problems with this prior to admission  Objective: Vital signs in last 24 hours: Temp:  [97.5 F (36.4 C)-98.5 F (36.9 C)] 97.9 F (36.6 C) (07/21 0741) Pulse Rate:  [67-86] 79 (07/21 0741) Resp:  [17-28] 20 (07/21 0741) BP: (109-180)/(54-79) 146/71 (07/21 0741) SpO2:  [90 %-100 %] 93 % (07/21 0400)    Intake/Output from previous day: 07/20 0701 - 07/21 0700 In: 1735.8 [I.V.:1735.8] Out: 275 [Urine:275] Intake/Output this shift: Total I/O In: -  Out: 175 [Urine:175]  General appearance: alert and cooperative Resp: clear to auscultation bilaterally Cardio: regular rate and rhythm GI: soft, NT, ND  Some rib tenderness, lungs clear after cough  Lab Results: CBC   Recent Labs  10/19/16 1244 10/20/16 0516  WBC 9.3 8.8  HGB 12.0* 11.5*  HCT 37.9* 35.5*  PLT 158 162   BMET  Recent Labs  10/19/16 1244 10/20/16 0516  NA 134* 134*  K 4.3 4.4  CL 104 105  CO2 24 22  GLUCOSE 124* 123*  BUN 24* 23*  CREATININE 1.23 1.11  CALCIUM 8.8* 8.3*   PT/INR  Recent Labs  10/19/16 1638  LABPROT 16.8*  INR 1.35     Assessment/Plan: Fall Mult L rib FX with PTX - PTX resolved, pulm toilet and BDs  L trapezium FX - splint, hand surgery will F/U in office ([place]) Dysphagia - ST following CV - resume home Eliquis ID - on home amoxicillin, resp CX P FEN - place NGT for TF VTE - Eliquis DIspo - SDU    LOS: 2 days    Georganna Skeans, MD, MPH, FACS Trauma: [phone] General Surgery: [phone]  7/21/2018Patient ID: Jason [name], male   DOB: [birth year], 83 y.o.   MRN: [id number]

## 2016-10-22 NOTE — NC FL2 (Signed)
[place] LEVEL OF CARE SCREENING TOOL     IDENTIFICATION  Patient Name: Jason [name] Birthdate: November 14, 1927 Sex: male Admission Date (Current Location): 10/19/2016  [facility] and [place] Number:  Herbalist and Address:  [facility], [place] [address], [place], [place] [address]      Provider Number: [id number]  Attending Physician Name and Address:  Md, Trauma, MD  Relative Name and Phone Number:       Current Level of Care: Hospital Recommended Level of Care: [place] Prior Approval Number:    Date Approved/Denied:   PASRR Number: [id number]  Discharge Plan: SNF    Current Diagnoses: Patient Active Problem List   Diagnosis Date Noted  . Fall at home 10/19/2016  . Gait abnormality 09/05/2016  . Parkinsonism ([place]) 09/05/2016  . CVA (cerebral vascular accident) ([place]) 07/01/2016  . Dysarthria   . History of cancer   . TIA (transient ischemic attack) 06/30/2016  . Hemiplegia affecting dominant side ([place]) 06/30/2016  . History of bladder cancer   . Dysphagia   . Hyperglycemia   . Stage 3 chronic kidney disease   . Acute blood loss anemia   . Leukocytosis   . Weakness   . Community acquired pneumonia 06/16/2016  . Unresponsiveness   . CAP (community acquired pneumonia) 06/15/2016  . PAF (paroxysmal atrial fibrillation) ([place]) 06/15/2016  . Pressure injury of skin 06/15/2016  . Lobar pneumonia ([place])   . Acute congestive heart failure ([place])   . Primary osteoarthritis of left knee 07/30/2015  . BPH (benign prostatic hyperplasia) 07/16/2012  . Multiple nodules of lung 09/16/2011  . GERD (gastroesophageal reflux disease) 06/14/2011  . Constipation, chronic 06/14/2011  . NHL (non-Hodgkin's lymphoma) ([place]) 01/03/2010  . Malignant neoplasm of bladder ([place]) 05/28/2007  . Hyperlipidemia 05/28/2007  . HIATAL HERNIA 05/28/2007  . BARRETT'S ESOPHAGUS, HX OF 05/28/2007    Orientation RESPIRATION BLADDER  Height & Weight     Self, Situation, Place  O2 (2L) Continent, External catheter Weight: 222 lb 14.2 oz (101.1 kg) Height:  5\' 10"  (177.8 cm)  BEHAVIORAL SYMPTOMS/MOOD NEUROLOGICAL BOWEL NUTRITION STATUS      Continent Diet (See DC summary)  AMBULATORY STATUS COMMUNICATION OF NEEDS Skin   Extensive Assist Verbally Normal                       Personal Care Assistance Level of Assistance  Bathing, Feeding, Dressing Bathing Assistance: Limited assistance Feeding assistance: Maximum assistance (currently having trouble swallowing, working with SPL) Dressing Assistance: Limited assistance     Functional Limitations Info  Sight, Hearing, Speech Sight Info: Adequate Hearing Info: Adequate Speech Info: Adequate    SPECIAL CARE FACTORS FREQUENCY  PT (By licensed PT), OT (By licensed OT), Speech therapy     PT Frequency: 5x OT Frequency: 5x            Contractures Contractures Info: Not present    Additional Factors Info  Code Status, Allergies, Isolation Precautions Code Status Info: Full Code Allergies Info: NKA     Isolation Precautions Info: MRSA     Current Medications (10/22/2016):  This is the current hospital active medication list Current Facility-Administered Medications  Medication Dose Route Frequency Provider Last Rate Last Dose  . acetaminophen (TYLENOL) tablet 650 mg  650 mg Oral Q6H PRN Rayburn, Kelly A, PA-C   650 mg at 10/20/16 0204  . amoxicillin (AMOXIL) capsule 500 mg  500 mg Oral Q12H  Rayburn, Claiborne Billings A, PA-C   500 mg at 10/21/16 2118  . apixaban (ELIQUIS) tablet 5 mg  5 mg Oral BID Georganna Skeans, MD   5 mg at 10/21/16 2112  . benzonatate (TESSALON) capsule 100 mg  100 mg Oral Q8H PRN Rayburn, Kelly A, PA-C      . carbidopa-levodopa (SINEMET IR) 25-100 MG per tablet immediate release 1 tablet  1 tablet Oral TID Rayburn, Kelly A, PA-C   1 tablet at 10/21/16 2112  . Chlorhexidine Gluconate Cloth 2 % PADS 6 each  6 each Topical Q0600 Lisette Abu, PA-C   6 each at 10/22/16 0522  . dextrose 5 % and 0.45 % NaCl with KCl 20 mEq/L infusion   Intravenous Continuous Georganna Skeans, MD 75 mL/hr at 10/22/16 0500    . docusate sodium (COLACE) capsule 100 mg  100 mg Oral QHS Rayburn, Kelly A, PA-C   100 mg at 10/21/16 2112  . feeding supplement (JEVITY 1.2 CAL) liquid 1,000 mL  1,000 mL Per Tube Continuous Georganna Skeans, MD      . hydrALAZINE (APRESOLINE) injection 10 mg  10 mg Intravenous Q2H PRN Rayburn, Kelly A, PA-C      . ipratropium-albuterol (DUONEB) 0.5-2.5 (3) MG/3ML nebulizer solution 3 mL  3 mL Nebulization Q2H PRN Rayburn, Kelly A, PA-C      . MEDLINE mouth rinse  15 mL Mouth Rinse BID Georganna Skeans, MD   15 mL at 10/21/16 2218  . morphine 4 MG/ML injection 2-4 mg  2-4 mg Intravenous Q2H PRN Rayburn, Kelly A, PA-C   2 mg at 10/21/16 2225  . mupirocin ointment (BACTROBAN) 2 % 1 application  1 application Nasal BID Lisette Abu, PA-C   1 application at 62/95/28 2128  . ondansetron (ZOFRAN-ODT) disintegrating tablet 4 mg  4 mg Oral Q6H PRN Rayburn, Kelly A, PA-C       Or  . ondansetron (ZOFRAN) injection 4 mg  4 mg Intravenous Q6H PRN Rayburn, Kelly A, PA-C   4 mg at 10/19/16 1636  . oxyCODONE (Oxy IR/ROXICODONE) immediate release tablet 5-10 mg  5-10 mg Oral Q4H PRN Rayburn, Kelly A, PA-C   5 mg at 10/20/16 1333  . pantoprazole (PROTONIX) EC tablet 40 mg  40 mg Oral Daily Rayburn, Kelly A, PA-C       Or  . pantoprazole (PROTONIX) injection 40 mg  40 mg Intravenous Daily Rayburn, Kelly A, PA-C   40 mg at 10/21/16 0912  . polyvinyl alcohol (LIQUIFILM TEARS) 1.4 % ophthalmic solution 1 drop  1 drop Both Eyes PRN Rozann Lesches, RPH      . senna (SENOKOT) tablet 8.6 mg  1 tablet Oral Daily Rayburn, Kelly A, PA-C   8.6 mg at 10/21/16 0911  . simvastatin (ZOCOR) tablet 40 mg  40 mg Oral QHS Rayburn, Kelly A, PA-C   40 mg at 10/21/16 2112  . tamsulosin (FLOMAX) capsule 0.4 mg  0.4 mg Oral QHS Rayburn, Kelly A, PA-C   0.4 mg  at 10/21/16 2112  . traMADol (ULTRAM) tablet 50 mg  50 mg Oral Q6H Georganna Skeans, MD   50 mg at 10/22/16 0525  . traZODone (DESYREL) tablet 50 mg  50 mg Oral QHS PRN Rayburn, Floyce Stakes, PA-C         Discharge Medications: Please see discharge summary for a list of discharge medications.  Relevant Imaging Results:  Relevant Lab Results:   Additional Information SS#: [id number]  Lilly Cove, [place]

## 2016-10-22 NOTE — Progress Notes (Signed)
Patient transferred to [facility] room 10 without any complications. All belongings were transferred with patient. [facility] was [facility] to [facility]. Emergency contact daughter, [name], was called and notified of the patient transfer.

## 2016-10-22 NOTE — Clinical Social Work Placement (Signed)
   CLINICAL SOCIAL WORK PLACEMENT  NOTE  Date:  10/22/2016  Patient Details  Name: Jason [name] MRN: [id number] Date of Birth: 02-21-1928  Clinical Social Work is seeking post-discharge placement for this patient at the [place] level of care (*CSW will initial, date and re-position this form in  chart as items are completed):  Yes   Patient/family provided with [place] Work Department's list of facilities offering this level of care within the geographic area requested by the patient (or if unable, by the patient's family).  Yes   Patient/family informed of their freedom to choose among providers that offer the needed level of care, that participate in Medicare, Medicaid or managed care program needed by the patient, have an available bed and are willing to accept the patient.  Yes   Patient/family informed of [facility]'s ownership interest in [facility] and [facility], as well as of the fact that they are under no obligation to receive care at these facilities.  PASRR submitted to EDS on       PASRR number received on       Existing PASRR number confirmed on 10/22/16     FL2 transmitted to all facilities in geographic area requested by pt/family on 10/22/16     FL2 transmitted to all facilities within larger geographic area on       Patient informed that his/her managed care company has contracts with or will negotiate with certain facilities, including the following:            Patient/family informed of bed offers received.  Patient chooses bed at       Physician recommends and patient chooses bed at      Patient to be transferred to   on  .  Patient to be transferred to facility by       Patient family notified on   of transfer.  Name of family member notified:        PHYSICIAN Please sign FL2     Additional Comment:    _______________________________________________ Lilly Cove, LCSW 10/22/2016, 12:23  PM

## 2016-10-22 NOTE — Clinical Social Work Note (Signed)
Clinical Social Work Assessment  Patient Details  Name: Jason [name] MRN: [id number] Date of Birth: 11-28-1927  Date of referral:  10/22/16               Reason for consult:  Facility Placement, Discharge Planning                Permission sought to share information with:  Case Manager, Customer service manager, Family Supports Permission granted to share information::  Yes, Verbal Permission Granted  Name::        Agency::  [facility]  Relationship::  Jason [name] and Jason [name] (daughters, Garment/textile technologist)  Sport and exercise psychologist Information:     Housing/Transportation Living arrangements for the past 2 months:  Single Family Home Source of Information:  Medical Team, Case Manager, Adult Children Patient Interpreter Needed:  None Criminal Activity/Legal Involvement Pertinent to Current Situation/Hospitalization:  No - Comment as needed Significant Relationships:  Adult Children, Other Family Members Lives with:  Self Do you feel safe going back to the place where you live?  No Need for family participation in patient care:  Yes (Comment)  Care giving concerns:  Patient admits due to walking with a walker lost his balance and fell backwards hitting his back on the corner of a dresser. Bruising with abrasion to thoracic area.  Patient lives at home, but was recently placed at [facility] for rehab post his Stroke.  Patient completed about 2 weeks of rehab and then returned home per daughter Jason [name].  Reports at this time, patient will rehab again and family along with patient are in agreement. Barriers for placement currently is the NG tube that is to be placed 7/21 for short term in effort to see if patient can swallow and complete TF. Daughter reports this happened the last time he was in the hospital in April, NG removed and as of last Wednesday 7/19 patient was eating solid foods. Daughter reports she things he hurts and everything is messed up, thus he is not swallowing.   Plan at this time is  SNF once medically stable and diet can be determined.   Social Worker assessment / plan:  Assessment completed with daughter Jason [name] who lives in [place]. She reports her other sister Jason [name] (who lives in [place]) is main Designer, television/film set, but she had to run errands this morning. Both very supportive and involved.  Both agreeable to SNF work up and SNF at discharge, hopeful for return to [facility].  LCSW will complete SNF work up.  Aware NG tube is barrier and will let facility know outcome closer to DC regarding diet.   Employment status:  Retired Forensic scientist:  [employer] PT Recommendations:  [place] / Referral to community resources:  [place]  Patient/Family's Response to care:  Agreeable to plan  Patient/Family's Understanding of and Emotional Response to Diagnosis, Current Treatment, and Prognosis:  Daughter very appreciative of information and follow up regarding discharge plan. Able to explain current treatments and plans pending course of treatment.  Emotional Assessment Appearance:  Appears stated age Attitude/Demeanor/Rapport:    Affect (typically observed):  Accepting, Adaptable, Pleasant Orientation:  Oriented to Self, Oriented to Place, Oriented to Situation Alcohol / Substance use:  Not Applicable Psych involvement (Current and /or in the community):  No (Comment)  Discharge Needs  Concerns to be addressed:  No discharge needs identified Readmission within the last 30 days:  No Current discharge risk:  None Barriers to Discharge:  No Barriers Identified   Lilly Cove,  LCSW 10/22/2016, 12:12 PM

## 2016-10-22 NOTE — Progress Notes (Signed)
Cortrak Tube Team Note:  Consult received to place a Cortrak feeding tube.   A 10 F Cortrak tube was placed in the L nare and secured with a nasal bridle at 72 cm. Per the Cortrak monitor reading the tube tip is gastric antrum   No x-ray is required. RN may begin using tube.   If the tube becomes dislodged please keep the tube and contact the Cortrak team at [email or web address] (password [email or web address]) for replacement.   If after hours and replacement cannot be delayed, place a NG tube and confirm placement with an abdominal x-ray.   Burtis Junes RD, LDN, CNSC Clinical Nutrition Pager: [phone] 10/22/2016 2:50 PM

## 2016-10-22 NOTE — Progress Notes (Signed)
Pt seen and examined.  Just given morphine so a little out of it Daughter present states doing well from her perspective  EXAM: Temp:  [97.5 F (36.4 C)-98.5 F (36.9 C)] 97.9 F (36.6 C) (07/21 0741) Pulse Rate:  [67-86] 79 (07/21 0741) Resp:  [17-28] 20 (07/21 0741) BP: (109-180)/(55-79) 146/71 (07/21 0741) SpO2:  [90 %-100 %] 93 % (07/21 0400) Intake/Output      07/20 0701 - 07/21 0700 07/21 0701 - 07/22 0700   I.V. (mL/kg) 1735.8 (17.2)    Total Intake(mL/kg) 1735.8 (17.2)    Urine (mL/kg/hr) 275 (0.1) 175 (0.6)   Total Output 275 175   Net +1460.8 -175        Urine Occurrence 1 x     Awake and alert Follows commands throughout MAEW. Sensation grossly intact  Plan Doing well from NS standpoint. Trauma is managing pain. Will sign off F/U 4 weeks after discharge for repeat imaging. Call for any concerns

## 2016-10-23 LAB — GLUCOSE, CAPILLARY
GLUCOSE-CAPILLARY: 119 mg/dL — AB (ref 65–99)
Glucose-Capillary: 118 mg/dL — ABNORMAL HIGH (ref 65–99)
Glucose-Capillary: 127 mg/dL — ABNORMAL HIGH (ref 65–99)
Glucose-Capillary: 134 mg/dL — ABNORMAL HIGH (ref 65–99)
Glucose-Capillary: 139 mg/dL — ABNORMAL HIGH (ref 65–99)

## 2016-10-23 LAB — CULTURE, RESPIRATORY: CULTURE: NORMAL

## 2016-10-23 LAB — CULTURE, RESPIRATORY W GRAM STAIN

## 2016-10-23 LAB — CBC
HCT: 32.8 % — ABNORMAL LOW (ref 39.0–52.0)
HEMOGLOBIN: 10.9 g/dL — AB (ref 13.0–17.0)
MCH: 28 pg (ref 26.0–34.0)
MCHC: 33.2 g/dL (ref 30.0–36.0)
MCV: 84.3 fL (ref 78.0–100.0)
Platelets: 189 10*3/uL (ref 150–400)
RBC: 3.89 MIL/uL — AB (ref 4.22–5.81)
RDW: 13.1 % (ref 11.5–15.5)
WBC: 10 10*3/uL (ref 4.0–10.5)

## 2016-10-23 MED ORDER — CARBIDOPA-LEVODOPA 10-100 MG PO TABS
1.0000 | ORAL_TABLET | Freq: Three times a day (TID) | ORAL | Status: DC
  Administered 2016-10-23 – 2016-10-26 (×11): 1 via ORAL
  Filled 2016-10-23 (×14): qty 1

## 2016-10-23 MED ORDER — HALOPERIDOL LACTATE 5 MG/ML IJ SOLN
5.0000 mg | INTRAMUSCULAR | Status: DC | PRN
  Administered 2016-10-23 – 2016-10-25 (×2): 5 mg via INTRAVENOUS
  Filled 2016-10-23 (×2): qty 1

## 2016-10-23 MED ORDER — AMOXICILLIN 500 MG PO CAPS
500.0000 mg | ORAL_CAPSULE | Freq: Two times a day (BID) | ORAL | Status: AC
  Administered 2016-10-23 – 2016-10-26 (×8): 500 mg via ORAL
  Filled 2016-10-23 (×9): qty 1

## 2016-10-23 MED ORDER — WHITE PETROLATUM GEL
Status: AC
  Administered 2016-10-24: 1
  Filled 2016-10-23: qty 1

## 2016-10-23 MED ORDER — CARBIDOPA-LEVODOPA 10-100MG/5ML ORAL SUSPENSION
5.0000 mL | Freq: Three times a day (TID) | ORAL | Status: DC
  Filled 2016-10-23: qty 5

## 2016-10-23 NOTE — Progress Notes (Addendum)
Progress Note: General Surgery Service   Assessment/Plan: Patient Active Problem List   Diagnosis Date Noted  . Fall at home 10/19/2016  . Gait abnormality 09/05/2016  . Parkinsonism ([place]) 09/05/2016  . CVA (cerebral vascular accident) ([place]) 07/01/2016  . Dysarthria   . History of cancer   . TIA (transient ischemic attack) 06/30/2016  . Hemiplegia affecting dominant side ([place]) 06/30/2016  . History of bladder cancer   . Dysphagia   . Hyperglycemia   . Stage 3 chronic kidney disease   . Acute blood loss anemia   . Leukocytosis   . Weakness   . Community acquired pneumonia 06/16/2016  . Unresponsiveness   . CAP (community acquired pneumonia) 06/15/2016  . PAF (paroxysmal atrial fibrillation) ([place]) 06/15/2016  . Pressure injury of skin 06/15/2016  . Lobar pneumonia ([place])   . Acute congestive heart failure ([place])   . Primary osteoarthritis of left knee 07/30/2015  . BPH (benign prostatic hyperplasia) 07/16/2012  . Multiple nodules of lung 09/16/2011  . GERD (gastroesophageal reflux disease) 06/14/2011  . Constipation, chronic 06/14/2011  . NHL (non-Hodgkin's lymphoma) ([place]) 01/03/2010  . Malignant neoplasm of bladder ([place]) 05/28/2007  . Hyperlipidemia 05/28/2007  . HIATAL HERNIA 05/28/2007  . BARRETT'S ESOPHAGUS, HX OF 05/28/2007   -continue TF via cortrak -speech therapy -saline lock IV -parkinson med to suspension   LOS: 3 days  Chief Complaint/Subjective: Unable to swallow tablets yesterday, cortrak placed, concern for aspiration  Objective: Vital signs in last 24 hours: Temp:  [97.6 F (36.4 C)-97.8 F (36.6 C)] 97.6 F (36.4 C) (07/22 0344) Pulse Rate:  [81-100] 81 (07/22 0800) Resp:  [13-25] 19 (07/22 0800) BP: (120-170)/(59-94) 136/59 (07/22 0800) SpO2:  [94 %-100 %] 100 % (07/22 0800) Weight:  [103 kg (227 lb 1.2 oz)] 103 kg (227 lb 1.2 oz) (07/21 1932)    Intake/Output from previous day: 07/21 0701 - 07/22 0700 In: 1372.2 [I.V.:1078.8;  NG/GT:293.4] Out: 175 [Urine:175] Intake/Output this shift: No intake/output data recorded.  Lungs: CTAB  Cardiovascular: RRR  Abd: soft, NT, ND  Extremities: no edema  Neuro: GCS 14  Lab Results: CBC   Recent Labs  10/23/16 0320  WBC 10.0  HGB 10.9*  HCT 32.8*  PLT 189   BMET No results for input(s): NA, K, CL, CO2, GLUCOSE, BUN, CREATININE, CALCIUM in the last 72 hours. PT/INR No results for input(s): LABPROT, INR in the last 72 hours. ABG No results for input(s): PHART, HCO3 in the last 72 hours.  Invalid input(s): PCO2, PO2  Studies/Results:  Anti-infectives: Anti-infectives    Start     Dose/Rate Route Frequency Ordered Stop   10/23/16 1000  amoxicillin (AMOXIL) capsule 500 mg     500 mg Oral Every 12 hours 10/23/16 0814 10/26/16 2359   10/19/16 2200  amoxicillin (AMOXIL) capsule 500 mg  Status:  Discontinued     500 mg Oral Every 12 hours 10/19/16 1822 10/23/16 0814      Medications: Scheduled Meds: . amoxicillin  500 mg Oral Q12H  . apixaban  5 mg Oral BID  . carbidopa-levodopa  1 tablet Oral TID  . Chlorhexidine Gluconate Cloth  6 each Topical Q0600  . docusate sodium  100 mg Oral QHS  . feeding supplement (PRO-STAT SUGAR FREE 64)  30 mL Per Tube Daily  . mouth rinse  15 mL Mouth Rinse BID  . mupirocin ointment  1 application Nasal BID  . pantoprazole  40 mg Oral Daily   Or  . pantoprazole (PROTONIX)  IV  40 mg Intravenous Daily  . senna  1 tablet Oral Daily  . simvastatin  40 mg Oral QHS  . tamsulosin  0.4 mg Oral QHS  . traMADol  50 mg Oral Q6H   Continuous Infusions: . dextrose 5 % and 0.45 % NaCl with KCl 20 mEq/L 50 mL/hr (10/22/16 1612)  . feeding supplement (JEVITY 1.2 CAL) 1,000 mL (10/23/16 0706)   PRN Meds:.acetaminophen, benzonatate, haloperidol lactate, hydrALAZINE, ipratropium-albuterol, morphine injection, ondansetron **OR** ondansetron (ZOFRAN) IV, oxyCODONE, polyvinyl alcohol, traZODone  Mickeal Skinner, MD Pg# [phone] [facility]

## 2016-10-24 LAB — BASIC METABOLIC PANEL
Anion gap: 9 (ref 5–15)
BUN: 24 mg/dL — ABNORMAL HIGH (ref 6–20)
CALCIUM: 8.4 mg/dL — AB (ref 8.9–10.3)
CO2: 22 mmol/L (ref 22–32)
CREATININE: 0.92 mg/dL (ref 0.61–1.24)
Chloride: 102 mmol/L (ref 101–111)
GFR calc Af Amer: >60 mL/min (ref >60)
GFR calc non Af Amer: >60 mL/min (ref >60)
GLUCOSE: 135 mg/dL — AB (ref 65–99)
Potassium: 4.1 mmol/L (ref 3.5–5.1)
Sodium: 133 mmol/L — ABNORMAL LOW (ref 135–145)

## 2016-10-24 LAB — GLUCOSE, CAPILLARY
GLUCOSE-CAPILLARY: 118 mg/dL — AB (ref 65–99)
GLUCOSE-CAPILLARY: 141 mg/dL — AB (ref 65–99)
Glucose-Capillary: 99 mg/dL (ref 65–99)
Glucose-Capillary: 138 mg/dL — ABNORMAL HIGH (ref 65–99)
Glucose-Capillary: 132 mg/dL — ABNORMAL HIGH (ref 65–99)
Glucose-Capillary: 136 mg/dL — ABNORMAL HIGH (ref 65–99)
Glucose-Capillary: 134 mg/dL — ABNORMAL HIGH (ref 65–99)

## 2016-10-24 NOTE — Progress Notes (Signed)
Physical Therapy Treatment Patient Details Name: Jason [name] MRN: [id number] DOB: 03-Jun-1927 Today's Date: 10/24/2016    History of Present Illness 83 yo admitted after ground level fall at home with Left 10,11 rib fx, T9-10 TVP fx, left trapezium fx. PMHx: non-Hodgkin lymphoma, CA (bladder, skin and prostate), CVA, Lt TKA, macular degeneration, Afib    PT Comments    Patient very sleepy this session. Tolerated sitting EOB ~15 mins with therapists but mobility limited today by pain and lethargy. Pt declining OOB mobility and ambulation due to above. Requires total A-Min A for support EOB. Participated in some UE/LE exercises but getting irritable as the session went on due to pain and eager to return to supine. VSS throughout. Will continue to follow and progress as tolerated. Concerned about pain medication making pt lethargic. Will have to find a balance of having pain controlled and pt's ability to tolerate activity.   Follow Up Recommendations  SNF;Supervision/Assistance - 24 hour     Equipment Recommendations  Other (comment) (platform RW)    Recommendations for Other Services       Precautions / Restrictions Precautions Precautions: Fall Restrictions Weight Bearing Restrictions: Yes LUE Weight Bearing: Weight bear through elbow only Other Position/Activity Restrictions: No orders; assumed d/t spica splint    Mobility  Bed Mobility Overal bed mobility: Needs Assistance Bed Mobility: Supine to Sit;Sit to Supine     Supine to sit: Total assist;+2 for physical assistance Sit to supine: Total assist;+2 for physical assistance   General bed mobility comments: Assist with LEs, bottom and to elevate trunk. Cues for sequencing but pt minimally participating. + dizziness but BP stable.  Transfers                 General transfer comment: Pt declined standing today despite encouragement.  Ambulation/Gait             General Gait Details: Pt declined secondary  to pain.   Stairs            Wheelchair Mobility    Modified Rankin (Stroke Patients Only)       Balance Overall balance assessment: Needs assistance Sitting-balance support: Feet supported;No upper extremity supported Sitting balance-Leahy Scale: Zero Sitting balance - Comments: Pt sat EOB ~15 mins requiring total A most of time with moments of Min A for only a few seconds at a time.  Postural control: Posterior lean                                  Cognition Arousal/Alertness: Lethargic Behavior During Therapy: Flat affect Overall Cognitive Status: Impaired/Different from baseline Area of Impairment: Attention;Following commands;Safety/judgement;Problem solving                   Current Attention Level: Sustained   Following Commands: Follows one step commands with increased time     Problem Solving: Slow processing General Comments: Pt very sleepy during session. Requires constant cues to stay attended to task. Distracted by pain.      Exercises General Exercises - Lower Extremity Long Arc Quad: Both;Seated;10 reps    General Comments General comments (skin integrity, edema, etc.): Able to maintain Sp02 >90% on RA. + coughing during session.      Pertinent Vitals/Pain Pain Assessment: Faces Faces Pain Scale: Hurts whole lot Pain Location: back, UE Pain Descriptors / Indicators: Grimacing;Sore Pain Intervention(s): Monitored during session;Repositioned;Limited activity within patient's tolerance;Patient requesting pain meds-RN  notified    Home Living                      Prior Function            PT Goals (current goals can now be found in the care plan section) Progress towards PT goals: Not progressing toward goals - comment (secondary to pain)    Frequency    Min 3X/week      PT Plan Current plan remains appropriate    Co-evaluation              AM-PAC PT "6 Clicks" Daily Activity  Outcome  Measure  Difficulty turning over in bed (including adjusting bedclothes, sheets and blankets)?: Total Difficulty moving from lying on back to sitting on the side of the bed? : Total Difficulty sitting down on and standing up from a chair with arms (e.g., wheelchair, bedside commode, etc,.)?: Total Help needed moving to and from a bed to chair (including a wheelchair)?: Total Help needed walking in hospital room?: Total Help needed climbing 3-5 steps with a railing? : Total 6 Click Score: 6    End of Session   Activity Tolerance: Patient limited by pain;Patient limited by fatigue Patient left: in bed;with call bell/phone within reach;with family/visitor present;with SCD's reapplied;with bed alarm set Nurse Communication: Mobility status PT Visit Diagnosis: Unsteadiness on feet (R26.81);Difficulty in walking, not elsewhere classified (R26.2);Muscle weakness (generalized) (M62.81);History of falling (Z91.81);Other abnormalities of gait and mobility (R26.89)     Time: 1401-1430 PT Time Calculation (min) (ACUTE ONLY): 29 min  Charges:  $Therapeutic Activity: 8-22 mins                    G Codes:       Wray Kearns, PT, DPT [phone]     Marguarite Arbour A Kyndall Amero 10/24/2016, 3:53 PM

## 2016-10-24 NOTE — Progress Notes (Signed)
SLP Cancellation Note  Patient Details Name: REVERE MAAHS MRN: [id number] DOB: 21-Dec-1927   Cancelled treatment:       Reason Eval/Treat Not Completed: Patient at procedure or test/unavailable. Pt with PT/OT   Donyetta Ogletree, Katherene Ponto 10/24/2016, 2:30 PM

## 2016-10-24 NOTE — Evaluation (Signed)
Occupational Therapy Evaluation Patient Details Name: Jason [name] MRN: [id number] DOB: 1928-02-05 Today's Date: 10/24/2016    History of Present Illness 83 yo admitted after ground level fall at home with Left 10,11 rib fx, T9-10 TVP fx, left trapezium fx. PMHx: non-Hodgkin lymphoma, CA (bladder, skin and prostate), CVA, Lt TKA, macular degeneration, Afib   Clinical Impression   Pt admitted with above. He demonstrates the below listed deficits and will benefit from continued OT to maximize safety and independence with BADLs.  Pt presents to OT with significant lethargy today, pain in his back, impaired cognition, decreased activity tolerance, and generalized weakness.  He requires max - total A for ADLs.  Recommend SNF level rehab.       Follow Up Recommendations  SNF    Equipment Recommendations  None recommended by OT    Recommendations for Other Services       Precautions / Restrictions Precautions Precautions: Fall Restrictions Weight Bearing Restrictions: Yes LUE Weight Bearing: Weight bear through elbow only Other Position/Activity Restrictions: No orders; assumed d/t spica splint      Mobility Bed Mobility Overal bed mobility: Needs Assistance Bed Mobility: Supine to Sit;Sit to Supine     Supine to sit: Total assist;+2 for physical assistance Sit to supine: Total assist;+2 for physical assistance   General bed mobility comments: Assist with LEs, bottom and to elevate trunk. Cues for sequencing but pt minimally participating. + dizziness but BP stable.  Transfers                 General transfer comment: Pt declined standing today despite encouragement.    Balance Overall balance assessment: Needs assistance Sitting-balance support: Feet supported;No upper extremity supported Sitting balance-Leahy Scale: Zero Sitting balance - Comments: Pt sat EOB ~15 mins requiring total A most of time with moments of Min A for only a few seconds at a time.   Postural control: Posterior lean                                 ADL either performed or assessed with clinical judgement   ADL Overall ADL's : Needs assistance/impaired Eating/Feeding: NPO   Grooming: Wash/dry hands;Wash/dry face;Oral care;Moderate assistance;Sitting   Upper Body Bathing: Total assistance;Sitting   Lower Body Bathing: Total assistance;Bed level   Upper Body Dressing : Total assistance;Sitting   Lower Body Dressing: Total assistance;Bed level   Toilet Transfer: Total assistance   Toileting- Clothing Manipulation and Hygiene: Total assistance;Bed level       Functional mobility during ADLs: Total assistance;+2 for physical assistance General ADL Comments: Pt very lethargic this date with limited engagement in activity      Vision         Perception     Praxis      Pertinent Vitals/Pain Pain Assessment: Faces Faces Pain Scale: Hurts whole lot Pain Location: back, UE Pain Descriptors / Indicators: Grimacing;Sore Pain Intervention(s): Repositioned;Monitored during session;Limited activity within patient's tolerance     Hand Dominance Right   Extremity/Trunk Assessment Upper Extremity Assessment Upper Extremity Assessment: LUE deficits/detail LUE Deficits / Details: Lt trapezium fx.  Pt in thumb spica splint    Lower Extremity Assessment Lower Extremity Assessment: Defer to PT evaluation   Cervical / Trunk Assessment Cervical / Trunk Assessment: Kyphotic   Communication Communication Communication: No difficulties   Cognition Arousal/Alertness: Lethargic Behavior During Therapy: Flat affect Overall Cognitive Status: Impaired/Different from baseline Area of Impairment: Attention;Following  commands;Safety/judgement;Problem solving                   Current Attention Level: Sustained   Following Commands: Follows one step commands with increased time Safety/Judgement: Decreased awareness of safety   Problem  Solving: Slow processing General Comments: Pt very sleepy during session. Requires constant cues to stay attended to task. Distracted by pain.   General Comments  Able to maintain Sp02 >90% on RA. + coughing during session.    Exercises General Exercises - Lower Extremity Long Arc Quad: Both;Seated;10 reps   Shoulder Instructions      Home Living Family/patient expects to be discharged to:: Private residence Living Arrangements: Alone Available Help at Discharge: Family;Available PRN/intermittently Type of Home: House Home Access: Level entry     Home Layout: One level     Bathroom Shower/Tub: Occupational psychologist: Standard Bathroom Accessibility: Yes   Home Equipment: Environmental consultant - 2 wheels;Bedside commode;Shower seat   Additional Comments: Was in rehab at [facility] when most recent incident occured      Prior Functioning/Environment Level of Independence: Needs assistance  Gait / Transfers Assistance Needed: used a RW ADL's / Homemaking Assistance Needed: had some assist for meals and cleaning; daughter states pt able to make some meals for himself, such as making cereal for breakfast   Comments: used a SPC for mobility        OT Problem List: Decreased strength;Decreased activity tolerance;Impaired balance (sitting and/or standing);Decreased cognition;Decreased safety awareness;Decreased knowledge of use of DME or AE;Decreased knowledge of precautions;Cardiopulmonary status limiting activity;Obesity;Impaired UE functional use;Pain      OT Treatment/Interventions: Self-care/ADL training;Therapeutic exercise;DME and/or AE instruction;Therapeutic activities;Cognitive remediation/compensation;Patient/family education;Balance training    OT Goals(Current goals can be found in the care plan section) Acute Rehab OT Goals Patient Stated Goal: Pt did not state due to lethargy  OT Goal Formulation: With patient/family Time For Goal Achievement:  11/07/16 Potential to Achieve Goals: Good ADL Goals Pt Will Perform Grooming: with min assist;standing Pt Will Perform Upper Body Bathing: with min assist;sitting Pt Will Perform Lower Body Bathing: with mod assist;sit to/from stand Pt Will Transfer to Toilet: with min assist;ambulating;regular height toilet;bedside commode;grab bars Pt Will Perform Toileting - Clothing Manipulation and hygiene: with min assist;sit to/from stand  OT Frequency: Min 2X/week   Barriers to D/C: Decreased caregiver support          Co-evaluation PT/OT/SLP Co-Evaluation/Treatment: Yes Reason for Co-Treatment: Complexity of the patient's impairments (multi-system involvement);For patient/therapist safety;Necessary to address cognition/behavior during functional activity   OT goals addressed during session: ADL's and self-care      AM-PAC PT "6 Clicks" Daily Activity     Outcome Measure Help from another person eating meals?: Total Help from another person taking care of personal grooming?: A Lot Help from another person toileting, which includes using toliet, bedpan, or urinal?: Total Help from another person bathing (including washing, rinsing, drying)?: Total Help from another person to put on and taking off regular upper body clothing?: Total Help from another person to put on and taking off regular lower body clothing?: Total 6 Click Score: 7   End of Session Equipment Utilized During Treatment: Oxygen Nurse Communication: Mobility status  Activity Tolerance: Patient limited by pain;Patient limited by lethargy Patient left: in bed;with call bell/phone within reach;with family/visitor present  OT Visit Diagnosis: Pain;Unsteadiness on feet (R26.81) Pain - part of body:  (back )  Time: 1400-1431 OT Time Calculation (min): 31 min Charges:  OT General Charges $OT Visit: 1 Procedure OT Evaluation $OT Eval Moderate Complexity: 1 Procedure G-Codes:     Lucille Passy,  OTR/L [phone]   [place], Jason [name] 10/24/2016, 6:09 PM

## 2016-10-24 NOTE — Progress Notes (Signed)
[facility] Progress Note     Subjective: CC: no complaints Patient had Cortrak placed for TF. Patient sleepy but answering questions appropriately and following commands.   Objective: Vital signs in last 24 hours: Temp:  [97.6 F (36.4 C)-99.1 F (37.3 C)] 97.6 F (36.4 C) (07/23 0400) Pulse Rate:  [69-93] 82 (07/23 0400) Resp:  [14-23] 22 (07/23 0400) BP: (105-158)/(54-75) 142/71 (07/23 0400) SpO2:  [95 %-100 %] 96 % (07/23 0400) Weight:  [102.2 kg (225 lb 4.8 oz)] 102.2 kg (225 lb 4.8 oz) (07/23 0400) Last BM Date: 10/23/16  Intake/Output from previous day: 07/22 0701 - 07/23 0700 In: 2104 [I.V.:650; NG/GT:1454] Out: 1175 [Urine:550; Emesis/NG output:625] Intake/Output this shift: No intake/output data recorded.  PE: Gen:  Sleepy but easily roused, NAD, pleasant Card:  Regular rate and rhythm, pedal pulses 2+ BL Pulm:  Normal effort, CTAB Abd: Soft, non-tender, non-distended, bowel sounds present  Skin: warm and dry, no rashes  Psych: A&Ox3   Lab Results:   Recent Labs  10/23/16 0320  WBC 10.0  HGB 10.9*  HCT 32.8*  PLT 189   CMP     Component Value Date/Time   NA 134 (L) 10/20/2016 0516   NA 140 01/08/2013 0919   K 4.4 10/20/2016 0516   K 4.6 01/08/2013 0919   CL 105 10/20/2016 0516   CL 105 07/09/2012 0828   CO2 22 10/20/2016 0516   CO2 25 01/08/2013 0919   GLUCOSE 123 (H) 10/20/2016 0516   GLUCOSE 99 01/08/2013 0919   GLUCOSE 94 07/09/2012 0828   BUN 23 (H) 10/20/2016 0516   BUN 25.1 01/08/2013 0919   CREATININE 1.11 10/20/2016 0516   CREATININE 1.1 01/08/2013 0919   CALCIUM 8.3 (L) 10/20/2016 0516   CALCIUM 9.6 01/08/2013 0919   PROT 5.7 (L) 07/05/2016 0408   PROT 7.3 01/08/2013 0919   ALBUMIN 2.6 (L) 07/05/2016 0408   ALBUMIN 3.6 01/08/2013 0919   AST 34 07/05/2016 0408   AST 22 01/08/2013 0919   ALT 19 07/05/2016 0408   ALT 11 01/08/2013 0919   ALKPHOS 25 (L) 07/05/2016 0408   ALKPHOS 38 (L) 01/08/2013 0919   BILITOT  0.6 07/05/2016 0408   BILITOT 0.49 01/08/2013 0919   GFRNONAA 58 (L) 10/20/2016 0516   GFRAA >60 10/20/2016 0516   Lipase     Component Value Date/Time   LIPASE 39 11/02/2015 0227     Studies/Results: Dg Chest Port 1 View  Result Date: 10/23/2016 CLINICAL DATA:  Aspiration EXAM: PORTABLE CHEST 1 VIEW COMPARISON:  10/21/2016 FINDINGS: Cardiomegaly with aortic atherosclerosis is identified. Layering left effusion is identified with pulmonary vascular redistribution consistent with CHF. Consolidation at the left lung base persist though slightly improved since previous. No new airspace opacities are noted. The feeding tube is seen extending below left hemidiaphragm. IMPRESSION: Stable cardiomegaly with aortic atherosclerosis. CHF with small left effusion and slight decrease in retrocardiac consolidation since 1 day prior. No new airspace opacities are identified. Electronically Signed   By: Ashley Royalty M.D.   On: 10/23/2016 00:18    Anti-infectives: Anti-infectives    Start     Dose/Rate Route Frequency Ordered Stop   10/23/16 1000  amoxicillin (AMOXIL) capsule 500 mg     500 mg Oral Every 12 hours 10/23/16 0814 10/26/16 2359   10/19/16 2200  amoxicillin (AMOXIL) capsule 500 mg  Status:  Discontinued     500 mg Oral Every 12 hours 10/19/16 1822 10/23/16 1324  Assessment/Plan Fall Mult L rib FX with PTX - PTX resolved, pulm toilet and BDs  T9-10 TP fxs, Nondisplaced T6 facet fx - NS signed off, will F/U in 4 weeks for repeat imaging  L trapezium FX - splint, hand surgery will F/U in office Caralyn Guile) Dysphagia - ST following, Cortrak placed for TFs CV - resume home Eliquis  ID - resp CX negative, WBC nml, afebrile - d/c amoxicillin  FEN - TF, SLP following VTE - Eliquis  DIspo - transfer to floor, SNF  LOS: 4 days    Brigid Re , [facility] 10/24/2016, 7:52 AM Pager: [phone] Trauma Pager: [phone] Mon-Fri 7:00 am-4:30 pm Sat-Sun 7:00  am-11:30 am

## 2016-10-25 LAB — GLUCOSE, CAPILLARY
GLUCOSE-CAPILLARY: 124 mg/dL — AB (ref 65–99)
GLUCOSE-CAPILLARY: 142 mg/dL — AB (ref 65–99)
Glucose-Capillary: 131 mg/dL — ABNORMAL HIGH (ref 65–99)
Glucose-Capillary: 126 mg/dL — ABNORMAL HIGH (ref 65–99)
Glucose-Capillary: 123 mg/dL — ABNORMAL HIGH (ref 65–99)
Glucose-Capillary: 119 mg/dL — ABNORMAL HIGH (ref 65–99)

## 2016-10-25 MED ORDER — BISACODYL 10 MG RE SUPP: 10.0000 mg | Freq: Every day | RECTAL | Status: DC | PRN

## 2016-10-25 MED ORDER — BISACODYL 10 MG RE SUPP
10.0000 mg | Freq: Once | RECTAL | Status: AC
  Administered 2016-10-25: 10 mg via RECTAL
  Filled 2016-10-25: qty 1

## 2016-10-25 MED ORDER — MORPHINE SULFATE (PF) 4 MG/ML IV SOLN: 2.0000 mg | INTRAVENOUS | Status: DC | PRN

## 2016-10-25 NOTE — Progress Notes (Signed)
Occupational Therapy Treatment Patient Details Name: LEOBARDO GRANLUND MRN: [id number] DOB: 12/25/1927 Today's Date: 10/25/2016    History of present illness 83 yo admitted after ground level fall at home with Left 10,11 rib fx, T9-10 TVP fx, left trapezium fx. PMHx: non-Hodgkin lymphoma, CA (bladder, skin and prostate), CVA, Lt TKA, macular degeneration, Afib   OT comments  Pt much more alert and participatory this date, but still with confusion.  He requires mod A +2 for functional mobility and mod - max A for ADLs.    Follow Up Recommendations  SNF    Equipment Recommendations  None recommended by OT    Recommendations for Other Services      Precautions / Restrictions Precautions Precautions: Fall Restrictions Weight Bearing Restrictions: Yes LUE Weight Bearing: Weight bear through elbow only Other Position/Activity Restrictions: No orders; assumed d/t spica splint       Mobility Bed Mobility Overal bed mobility: Needs Assistance Bed Mobility: Supine to Sit     Supine to sit: Mod assist     General bed mobility comments: cues to initiate activity and assist to lift trunk and move LEs off bed   Transfers Overall transfer level: Needs assistance Equipment used: Left platform walker;2 person hand held assist Transfers: Sit to/from Stand Sit to Stand: Mod assist;+2 physical assistance;+2 safety/equipment;From elevated surface         General transfer comment: Pt required mod assist x2 to initiate transfer to walker, once standing pt still required mod assist to maintain static standing due to pt leaning heavily to L.     Balance Overall balance assessment: Needs assistance Sitting-balance support: Single extremity supported;Feet supported Sitting balance-Leahy Scale: Poor Sitting balance - Comments: Pt sat EOB min gaurd for ~ 1 min prior to stand transfer.  Postural control: Posterior lean Standing balance support: Bilateral upper extremity supported Standing  balance-Leahy Scale: Poor                             ADL either performed or assessed with clinical judgement   ADL Overall ADL's : Needs assistance/impaired Eating/Feeding: NPO                       Toilet Transfer: Moderate assistance;+2 for physical assistance;BSC;RW   Toileting- Clothing Manipulation and Hygiene: Total assistance;Sit to/from stand       Functional mobility during ADLs: Moderate assistance;+2 for physical assistance;Rolling walker       Vision       Perception     Praxis      Cognition Arousal/Alertness: Awake/alert Behavior During Therapy: Flat affect Overall Cognitive Status: Impaired/Different from baseline Area of Impairment: Safety/judgement;Problem solving                   Current Attention Level: Sustained   Following Commands: Follows one step commands with increased time Safety/Judgement: Decreased awareness of safety;Decreased awareness of deficits   Problem Solving: Slow processing;Difficulty sequencing;Requires verbal cues;Decreased initiation;Requires tactile cues General Comments: AxOx3 but pt still confused at times and stated that he was in the parking lot of a grocery store today.        Exercises     Shoulder Instructions       General Comments VSS    Pertinent Vitals/ Pain       Pain Assessment: Faces Faces Pain Scale: Hurts a little bit Pain Location: rib fx site.  Pain Descriptors / Indicators: Grimacing;Moaning Pain  Intervention(s): Monitored during session  Home Living                                          Prior Functioning/Environment              Frequency  Min 2X/week        Progress Toward Goals  OT Goals(current goals can now be found in the care plan section)  Progress towards OT goals: Progressing toward goals  Acute Rehab OT Goals Patient Stated Goal: to be able to perform better.  Plan Discharge plan remains appropriate     Co-evaluation    PT/OT/SLP Co-Evaluation/Treatment: Yes Reason for Co-Treatment: Complexity of the patient's impairments (multi-system involvement);To address functional/ADL transfers PT goals addressed during session: Mobility/safety with mobility;Proper use of DME;Balance OT goals addressed during session: ADL's and self-care      AM-PAC PT "6 Clicks" Daily Activity     Outcome Measure   Help from another person eating meals?: Total Help from another person taking care of personal grooming?: A Lot Help from another person toileting, which includes using toliet, bedpan, or urinal?: A Lot Help from another person bathing (including washing, rinsing, drying)?: A Lot Help from another person to put on and taking off regular upper body clothing?: A Lot Help from another person to put on and taking off regular lower body clothing?: A Lot 6 Click Score: 11    End of Session    OT Visit Diagnosis: Pain;Unsteadiness on feet (R26.81)   Activity Tolerance     Patient Left     Nurse Communication          Time: 0539-7673 OT Time Calculation (min): 23 min  Charges: OT General Charges $OT Visit: 1 Procedure OT Treatments $Therapeutic Activity: 8-22 mins  [facility], OTR/L [phone]    Lucille Passy M 10/25/2016, 5:02 PM

## 2016-10-25 NOTE — Progress Notes (Signed)
Physical Therapy Treatment Patient Details Name: Jason [name] MRN: [id number] DOB: [birth year] Today's Date: 10/25/2016    History of Present Illness 83 yo admitted after ground level fall at home with Left 10,11 rib fx, T9-10 TVP fx, left trapezium fx. PMHx: non-Hodgkin lymphoma, CA (bladder, skin and prostate), CVA, Lt TKA, macular degeneration, Afib    PT Comments    Pt performed well during tx today and slowly progressing toward goals. Pt has increased ambulation distance but still requires mod assist x2 to maintain stability. Pt has decreased assistance level with sitting EOB but only able to maintain balance for ~1 min. Pt responds exceptionally well to short and concise directions during gait in order to increase step length on LLE. Pt continues to show improvements but remains a fall risk due to overall instability and decreased CV endurance. Plan of care remains appropriate. Focus of next tx to increase gait distance and BLE strength and endurance.   Follow Up Recommendations  SNF;Supervision/Assistance - 24 hour     Equipment Recommendations  Other (comment) (Platform walker)    Recommendations for Other Services OT consult     Precautions / Restrictions Precautions Precautions: Fall Restrictions Weight Bearing Restrictions: Yes LUE Weight Bearing: Weight bear through elbow only Other Position/Activity Restrictions: No orders; assumed d/t spica splint    Mobility  Bed Mobility Overal bed mobility: Needs Assistance Bed Mobility: Supine to Sit     Supine to sit: Total assist;+2 for physical assistance     General bed mobility comments: Assist with LEs, bottom and to elevate trunk. Cues for sequencing but pt minimally participating.  Transfers Overall transfer level: Needs assistance Equipment used: Left platform walker;2 person hand held assist Transfers: Sit to/from Stand Sit to Stand: Mod assist;+2 physical assistance;+2 safety/equipment;From elevated  surface         General transfer comment: Pt required mod assist x2 to initiate transfer to walker, once standing pt still required mod assist to maintain static standing due to pt leaning heavily to L.   Ambulation/Gait Ambulation/Gait assistance: Mod assist;+2 physical assistance;+2 safety/equipment Ambulation Distance (Feet): 45 Feet Assistive device: Left platform walker;2 person hand held assist Gait Pattern/deviations: Step-through pattern;Decreased stance time - right;Decreased stride length;Decreased weight shift to right;Shuffle;Trunk flexed Gait velocity: slowed   General Gait Details: Pt required max vc's to initiate L foot. Pt responded well to "big step" and able to take few steps before pausing. Pt tends to lean heavily on RLE during stance and swing phase requiring mod assist to maintain upright posture. Pt's R knee displayed instability with slight buckling, but pt able to address prior to sitting in follow chair. Pt required sequencing during entire gait tx.    Stairs            Wheelchair Mobility    Modified Rankin (Stroke Patients Only)       Balance Overall balance assessment: Needs assistance Sitting-balance support: Single extremity supported;Feet supported Sitting balance-Leahy Scale: Zero Sitting balance - Comments: Pt sat EOB min gaurd for ~ 1 min prior to stand transfer.    Standing balance support: Bilateral upper extremity supported Standing balance-Leahy Scale: Poor                              Cognition Arousal/Alertness: Awake/alert Behavior During Therapy: Flat affect Overall Cognitive Status: Impaired/Different from baseline Area of Impairment: Safety/judgement;Problem solving  Current Attention Level: Sustained   Following Commands: Follows one step commands with increased time Safety/Judgement: Decreased awareness of safety;Decreased awareness of deficits   Problem Solving: Slow  processing;Difficulty sequencing;Requires verbal cues;Decreased initiation;Requires tactile cues General Comments: AxOx3 but pt still confused at times and stated that he was in the parking lot of a grocery store today.      Exercises      General Comments        Pertinent Vitals/Pain Pain Assessment: Faces Faces Pain Scale: Hurts a little bit Pain Location: rib fx site.  Pain Descriptors / Indicators: Grimacing;Moaning Pain Intervention(s): Limited activity within patient's tolerance;Monitored during session    Home Living                      Prior Function            PT Goals (current goals can now be found in the care plan section) Acute Rehab PT Goals Patient Stated Goal: to be able to perform better. PT Goal Formulation: With patient/family Potential to Achieve Goals: Fair Progress towards PT goals: Progressing toward goals    Frequency    Min 3X/week      PT Plan Current plan remains appropriate    Co-evaluation PT/OT/SLP Co-Evaluation/Treatment: Yes Reason for Co-Treatment: Complexity of the patient's impairments (multi-system involvement);For patient/therapist safety;To address functional/ADL transfers PT goals addressed during session: Mobility/safety with mobility;Proper use of DME;Balance OT goals addressed during session: ADL's and self-care      AM-PAC PT "6 Clicks" Daily Activity  Outcome Measure  Difficulty turning over in bed (including adjusting bedclothes, sheets and blankets)?: Total Difficulty moving from lying on back to sitting on the side of the bed? : Total Difficulty sitting down on and standing up from a chair with arms (e.g., wheelchair, bedside commode, etc,.)?: Total Help needed moving to and from a bed to chair (including a wheelchair)?: Total Help needed walking in hospital room?: Total Help needed climbing 3-5 steps with a railing? : Total 6 Click Score: 6    End of Session Equipment Utilized During Treatment: Gait  belt;Oxygen Activity Tolerance: Patient tolerated treatment well;Patient limited by fatigue Patient left: in chair;with call bell/phone within reach;with chair alarm set;with family/visitor present Nurse Communication: Mobility status PT Visit Diagnosis: Unsteadiness on feet (R26.81);Difficulty in walking, not elsewhere classified (R26.2);Muscle weakness (generalized) (M62.81);History of falling (Z91.81);Other abnormalities of gait and mobility (R26.89)     Time: 1359-1435 PT Time Calculation (min) (ACUTE ONLY): 36 min  Charges:  $Gait Training: 8-22 mins $Therapeutic Activity: 8-22 mins                    G CodesRandalyn Rhea, [place] 10/25/2016, 3:53 PM

## 2016-10-25 NOTE — Progress Notes (Signed)
  Speech Language Pathology Treatment: Dysphagia  Patient Details Name: Jason [name] MRN: [id number] DOB: June 13, 1927 Today's Date: 10/25/2016 Time: 1040-1100 SLP Time Calculation (min) (ACUTE ONLY): 20 min  Assessment / Plan / Recommendation Clinical Impression  Pt continues to demonstrate immediate signs of aspiration with thin liquids, though trials of puree tolerated with cues for multiple swallows. Strength of pts cough and ability to expectorate appears subjectively to have improved since last week's notes. Guided pt and daugther in CTAR exercise and encouraged this with 30 reps 3 x a day as well as frequent tooth brushing. Will f/u with MBS tomorrow to assess readiness for modified diet.   HPI        SLP Plan  MBS       Recommendations  Diet recommendations: NPO                Oral Care Recommendations: Oral care QID Follow up Recommendations: Skilled Nursing facility SLP Visit Diagnosis: Dysphagia, unspecified (R13.10) Plan: MBS       GO               [name], MA CCC-SLP [phone]  Lynann Beaver 10/25/2016, 1:27 PM

## 2016-10-25 NOTE — Progress Notes (Signed)
[facility] Progress Note     Subjective: CC: no complaints Patient's granddaughter at bedside, feels like breathing sounds much better. Was having trouble with swallowing PTA. Was getting home therapies PTA. More alert this morning, but slightly more confused.  UOP good.  Objective: Vital signs in last 24 hours: Temp:  [97.7 F (36.5 C)-98.2 F (36.8 C)] 97.7 F (36.5 C) (07/24 0735) Pulse Rate:  [63-90] 80 (07/24 0735) Resp:  [12-183] 15 (07/24 0735) BP: (107-177)/(53-86) 132/70 (07/24 0735) SpO2:  [97 %-100 %] 98 % (07/24 0735) Weight:  [101.4 kg (223 lb 9.6 oz)] 101.4 kg (223 lb 9.6 oz) (07/24 0321) Last BM Date: 10/23/16  Intake/Output from previous day: 07/23 0701 - 07/24 0700 In: 2645 [I.V.:1150; NG/GT:1495] Out: 1575 [Urine:1575] Intake/Output this shift: No intake/output data recorded.  PE: Gen:  Alert, NAD, pleasant Card:  Regular rate and rhythm, no M/G/R Pulm:  Normal effort, clear to auscultation bilaterally Abd: Soft, non-tender, mildly distended, bowel sounds present  MSK: left forearm in splint, fingers warm and well perfused.  Skin: warm and dry, no rashes  Psych: a little more confused this AM, but reorients easily  Lab Results:   Recent Labs  10/23/16 0320  WBC 10.0  HGB 10.9*  HCT 32.8*  PLT 189   BMET  Recent Labs  10/24/16 0831  NA 133*  K 4.1  CL 102  CO2 22  GLUCOSE 135*  BUN 24*  CREATININE 0.92  CALCIUM 8.4*   CMP     Component Value Date/Time   NA 133 (L) 10/24/2016 0831   NA 140 01/08/2013 0919   K 4.1 10/24/2016 0831   K 4.6 01/08/2013 0919   CL 102 10/24/2016 0831   CL 105 07/09/2012 0828   CO2 22 10/24/2016 0831   CO2 25 01/08/2013 0919   GLUCOSE 135 (H) 10/24/2016 0831   GLUCOSE 99 01/08/2013 0919   GLUCOSE 94 07/09/2012 0828   BUN 24 (H) 10/24/2016 0831   BUN 25.1 01/08/2013 0919   CREATININE 0.92 10/24/2016 0831   CREATININE 1.1 01/08/2013 0919   CALCIUM 8.4 (L) 10/24/2016 0831   CALCIUM  9.6 01/08/2013 0919   PROT 5.7 (L) 07/05/2016 0408   PROT 7.3 01/08/2013 0919   ALBUMIN 2.6 (L) 07/05/2016 0408   ALBUMIN 3.6 01/08/2013 0919   AST 34 07/05/2016 0408   AST 22 01/08/2013 0919   ALT 19 07/05/2016 0408   ALT 11 01/08/2013 0919   ALKPHOS 25 (L) 07/05/2016 0408   ALKPHOS 38 (L) 01/08/2013 0919   BILITOT 0.6 07/05/2016 0408   BILITOT 0.49 01/08/2013 0919   GFRNONAA >60 10/24/2016 0831   GFRAA >60 10/24/2016 0831   Lipase     Component Value Date/Time   LIPASE 39 11/02/2015 0227    Anti-infectives: Anti-infectives    Start     Dose/Rate Route Frequency Ordered Stop   10/23/16 1000  amoxicillin (AMOXIL) capsule 500 mg     500 mg Oral Every 12 hours 10/23/16 0814 10/26/16 2359   10/19/16 2200  amoxicillin (AMOXIL) capsule 500 mg  Status:  Discontinued     500 mg Oral Every 12 hours 10/19/16 1822 10/23/16 1025       Assessment/Plan Fall Mult L rib FX with PTX - PTX resolved,pulm toilet and BDs  T9-10 TP fxs, Nondisplaced T6 facet fx - NS signed off, will F/U in 4 weeks for repeat imaging  L trapezium FX- splint, hand surgery will F/U in office Caralyn Guile) Dysphagia- ST following,  Cortrak placed for TFs CV- resume home Eliquis  ID - resp CX negative, WBC nml, afebrile - d/c amoxicillin  FEN- TF, SLP following VTE- Eliquis  DIspo- transfer to floor, SNF  LOS: 5 days    Brigid Re , [facility] Surgery 10/25/2016, 7:52 AM Pager: [phone] Trauma Pager: [phone] Mon-Fri 7:00 am-4:30 pm Sat-Sun 7:00 am-11:30 am

## 2016-10-26 ENCOUNTER — Inpatient Hospital Stay (HOSPITAL_COMMUNITY): Payer: Medicare Other

## 2016-10-26 LAB — GLUCOSE, CAPILLARY
Glucose-Capillary: 116 mg/dL — ABNORMAL HIGH (ref 65–99)
Glucose-Capillary: 142 mg/dL — ABNORMAL HIGH (ref 65–99)
Glucose-Capillary: 117 mg/dL — ABNORMAL HIGH (ref 65–99)

## 2016-10-26 LAB — BASIC METABOLIC PANEL
ANION GAP: 8 (ref 5–15)
BUN: 31 mg/dL — ABNORMAL HIGH (ref 6–20)
CHLORIDE: 103 mmol/L (ref 101–111)
CO2: 21 mmol/L — AB (ref 22–32)
Calcium: 8.4 mg/dL — ABNORMAL LOW (ref 8.9–10.3)
Creatinine, Ser: 0.98 mg/dL (ref 0.61–1.24)
GFR calc non Af Amer: >60 mL/min (ref >60)
Glucose, Bld: 121 mg/dL — ABNORMAL HIGH (ref 65–99)
POTASSIUM: 4.8 mmol/L (ref 3.5–5.1)
Sodium: 132 mmol/L — ABNORMAL LOW (ref 135–145)

## 2016-10-26 LAB — CBC
HCT: 34.3 % — ABNORMAL LOW (ref 39.0–52.0)
HEMOGLOBIN: 11.1 g/dL — AB (ref 13.0–17.0)
MCH: 27.2 pg (ref 26.0–34.0)
MCHC: 32.4 g/dL (ref 30.0–36.0)
MCV: 84.1 fL (ref 78.0–100.0)
Platelets: 259 10*3/uL (ref 150–400)
RBC: 4.08 MIL/uL — AB (ref 4.22–5.81)
RDW: 12.8 % (ref 11.5–15.5)
WBC: 13.9 10*3/uL — ABNORMAL HIGH (ref 4.0–10.5)

## 2016-10-26 NOTE — Progress Notes (Signed)
SLP Cancellation Note  Patient Details Name: Jason [name] MRN: [id number] DOB: September 19, 1927   Cancelled treatment:       Reason Eval/Treat Not Completed: Fatigue/lethargy limiting ability to participate. Attempted MBS given pts excellent participation yesterday, but pt very lethargic upon arrival to [facility] suite. COuld not expectorate secretions or sustain eyes open. Cancelled todays MBS, will follow for readiness.   Jason [name], [place] CCC-SLP [phone]  Lynann Beaver 10/26/2016, 11:00 AM

## 2016-10-26 NOTE — Consult Note (Signed)
   [facility] CM Inpatient Consult   10/26/2016  Jason [name] [birth year] [id number]    Patient screened for potential [facility] Care Management services.   Chart reviewed. Noted discharge plan is to return to [facility]. No identifiable [facility] Care Management needs at this time.   Marthenia Rolling, MSN-Ed, RN,BSN [facility] Liaison [phone]

## 2016-10-26 NOTE — Clinical Social Work Note (Signed)
Clinical Social Worker continuing to follow patient and family for support and discharge planning needs.  Patient was unable to participate with MBS today due to lethargy.  Plan is for patient to discharge to [facility] once medically stable.  MD to establish means of feeding prior to discharge.  CSW remains available for support and to facilitate patient discharge needs once medically stable.  Barbette Or, [place]

## 2016-10-26 NOTE — Progress Notes (Signed)
[facility] Progress Note     Subjective: CC: sleepy  Patient more lethargic today, but easily woken. Tolerating TF.  Had 2 BMs overnight.  Objective: Vital signs in last 24 hours: Temp:  [98 F (36.7 C)-98.5 F (36.9 C)] 98.5 F (36.9 C) (07/25 0437) Pulse Rate:  [36-82] 76 (07/25 0437) Resp:  [16-27] 19 (07/25 0437) BP: (120-163)/(54-83) 120/54 (07/25 0437) SpO2:  [97 %-99 %] 97 % (07/25 0437) Last BM Date: 10/23/16  Intake/Output from previous day: 07/24 0701 - 07/25 0700 In: 2335 [I.V.:1100; NG/GT:1235] Out: 750 [Urine:750] Intake/Output this shift: No intake/output data recorded.  PE: Gen:  Alert, NAD, pleasant Card:  Regular rate and rhythm, pedal pulses 2+ BL Pulm:  Normal effort, clear to auscultation bilaterally Abd: Soft, non-tender, non-distended, bowel sounds present  Skin: warm and dry, no rashes  Psych: A&Ox3   Lab Results:  No results for input(s): WBC, HGB, HCT, PLT in the last 72 hours. BMET  Recent Labs  10/24/16 0831  NA 133*  K 4.1  CL 102  CO2 22  GLUCOSE 135*  BUN 24*  CREATININE 0.92  CALCIUM 8.4*   PT/INR No results for input(s): LABPROT, INR in the last 72 hours. CMP     Component Value Date/Time   NA 133 (L) 10/24/2016 0831   NA 140 01/08/2013 0919   K 4.1 10/24/2016 0831   K 4.6 01/08/2013 0919   CL 102 10/24/2016 0831   CL 105 07/09/2012 0828   CO2 22 10/24/2016 0831   CO2 25 01/08/2013 0919   GLUCOSE 135 (H) 10/24/2016 0831   GLUCOSE 99 01/08/2013 0919   GLUCOSE 94 07/09/2012 0828   BUN 24 (H) 10/24/2016 0831   BUN 25.1 01/08/2013 0919   CREATININE 0.92 10/24/2016 0831   CREATININE 1.1 01/08/2013 0919   CALCIUM 8.4 (L) 10/24/2016 0831   CALCIUM 9.6 01/08/2013 0919   PROT 5.7 (L) 07/05/2016 0408   PROT 7.3 01/08/2013 0919   ALBUMIN 2.6 (L) 07/05/2016 0408   ALBUMIN 3.6 01/08/2013 0919   AST 34 07/05/2016 0408   AST 22 01/08/2013 0919   ALT 19 07/05/2016 0408   ALT 11 01/08/2013 0919   ALKPHOS 25  (L) 07/05/2016 0408   ALKPHOS 38 (L) 01/08/2013 0919   BILITOT 0.6 07/05/2016 0408   BILITOT 0.49 01/08/2013 0919   GFRNONAA >60 10/24/2016 0831   GFRAA >60 10/24/2016 0831   Lipase     Component Value Date/Time   LIPASE 39 11/02/2015 0227    Anti-infectives: Anti-infectives    Start     Dose/Rate Route Frequency Ordered Stop   10/23/16 1000  amoxicillin (AMOXIL) capsule 500 mg     500 mg Oral Every 12 hours 10/23/16 0814 10/26/16 2359   10/19/16 2200  amoxicillin (AMOXIL) capsule 500 mg  Status:  Discontinued     500 mg Oral Every 12 hours 10/19/16 1822 10/23/16 6226       Assessment/Plan Fall Mult L rib FX with PTX - PTX resolved,pulm toilet and BDs  T9-10 TP fxs, Nondisplaced T6 facet fx - NS signed off, will F/U in 4 weeks for repeat imaging  L trapezium FX- splint, hand surgery will F/U in office Caralyn Guile) Dysphagia- ST following, Cortrakplaced for TFs CV- resume home Eliquis  ID - resp CX negative, WBC nml, afebrile - d/c amoxicillin  FEN- TF, SLP following - need to determine PO vs PEG  VTE- Eliquis  Dispo- SNF. Need to decide whether patient will be able to  swallow or if he needs PEG placement.  LOS: 6 days    Brigid Re , [facility] Surgery 10/26/2016, 8:37 AM Pager: [phone] Trauma Pager: [phone] Mon-Fri 7:00 am-4:30 pm Sat-Sun 7:00 am-11:30 am

## 2016-10-27 ENCOUNTER — Inpatient Hospital Stay (HOSPITAL_COMMUNITY): Payer: Medicare Other

## 2016-10-27 LAB — GLUCOSE, CAPILLARY
GLUCOSE-CAPILLARY: 120 mg/dL — AB (ref 65–99)
GLUCOSE-CAPILLARY: 118 mg/dL — AB (ref 65–99)
GLUCOSE-CAPILLARY: 119 mg/dL — AB (ref 65–99)
Glucose-Capillary: 119 mg/dL — ABNORMAL HIGH (ref 65–99)
Glucose-Capillary: 119 mg/dL — ABNORMAL HIGH (ref 65–99)
Glucose-Capillary: 141 mg/dL — ABNORMAL HIGH (ref 65–99)

## 2016-10-27 MED ORDER — SENNA 8.6 MG PO TABS
1.0000 | ORAL_TABLET | Freq: Every day | ORAL | Status: DC
  Administered 2016-10-28: 8.6 mg via ORAL
  Filled 2016-10-27: qty 1

## 2016-10-27 MED ORDER — TRAMADOL HCL 50 MG PO TABS
50.0000 mg | ORAL_TABLET | Freq: Four times a day (QID) | ORAL | Status: DC
  Administered 2016-10-27: 50 mg
  Filled 2016-10-27: qty 1

## 2016-10-27 MED ORDER — KCL IN DEXTROSE-NACL 10-5-0.45 MEQ/L-%-% IV SOLN
INTRAVENOUS | Status: DC
  Administered 2016-10-27 – 2016-10-28 (×2): via INTRAVENOUS
  Filled 2016-10-27 (×3): qty 1000

## 2016-10-27 MED ORDER — PRO-STAT SUGAR FREE PO LIQD
30.0000 mL | Freq: Every day | ORAL | Status: DC
  Administered 2016-10-28: 30 mL via ORAL
  Filled 2016-10-27: qty 30

## 2016-10-27 MED ORDER — PANTOPRAZOLE SODIUM 40 MG PO PACK
40.0000 mg | PACK | Freq: Every day | ORAL | Status: DC
  Administered 2016-10-27: 40 mg
  Filled 2016-10-27: qty 20

## 2016-10-27 MED ORDER — TRAMADOL 5 MG/ML ORAL SUSPENSION: 50.0000 mg | Freq: Four times a day (QID) | ORAL | Status: DC

## 2016-10-27 MED ORDER — PANTOPRAZOLE SODIUM 40 MG PO TBEC
40.0000 mg | DELAYED_RELEASE_TABLET | Freq: Every day | ORAL | Status: DC
  Administered 2016-10-28: 40 mg via ORAL
  Filled 2016-10-27: qty 1

## 2016-10-27 MED ORDER — CARBIDOPA-LEVODOPA 10-100MG/5ML ORAL SUSPENSION
5.0000 mL | Freq: Three times a day (TID) | ORAL | Status: DC
Start: 2016-10-27 — End: 2016-10-28
  Administered 2016-10-27 (×3): 5 mL
  Filled 2016-10-27 (×4): qty 5

## 2016-10-27 MED ORDER — TRAMADOL HCL 50 MG PO TABS
50.0000 mg | ORAL_TABLET | Freq: Four times a day (QID) | ORAL | Status: DC
  Administered 2016-10-27 – 2016-10-28 (×3): 50 mg via ORAL
  Filled 2016-10-27 (×3): qty 1

## 2016-10-27 MED ORDER — SIMVASTATIN 40 MG PO TABS
40.0000 mg | ORAL_TABLET | Freq: Every day | ORAL | Status: DC
  Administered 2016-10-27: 40 mg

## 2016-10-27 MED ORDER — SENNOSIDES 8.8 MG/5ML PO SYRP
5.0000 mL | ORAL_SOLUTION | Freq: Every day | ORAL | Status: DC
  Filled 2016-10-27: qty 5

## 2016-10-27 MED ORDER — APIXABAN 5 MG PO TABS
5.0000 mg | ORAL_TABLET | Freq: Two times a day (BID) | ORAL | Status: DC
  Administered 2016-10-27 (×2): 5 mg

## 2016-10-27 MED ORDER — SIMVASTATIN 40 MG PO TABS
40.0000 mg | ORAL_TABLET | Freq: Every day | ORAL | Status: DC
  Filled 2016-10-27: qty 1

## 2016-10-27 MED ORDER — DOCUSATE SODIUM 50 MG/5ML PO LIQD
100.0000 mg | Freq: Every day | ORAL | Status: DC
  Administered 2016-10-27: 100 mg via ORAL
  Filled 2016-10-27: qty 10

## 2016-10-27 MED ORDER — APIXABAN 5 MG PO TABS
5.0000 mg | ORAL_TABLET | Freq: Two times a day (BID) | ORAL | Status: DC
  Administered 2016-10-28: 5 mg via ORAL
  Filled 2016-10-27 (×2): qty 1

## 2016-10-27 MED ORDER — RESOURCE THICKENUP CLEAR PO POWD
ORAL | Status: DC | PRN
  Filled 2016-10-27: qty 125

## 2016-10-27 NOTE — Care Management Note (Signed)
Case Management Note  Patient Details  Name: ATARI NOVICK MRN: [id number] Date of Birth: [birth year]  Subjective/Objective:  Pt admitted on 10/19/16 s/p ground level fall with multiple Lt rib fx with PTX, Lt trapezium fx, and thoracic spine fractures.  PTA, pt resides at home alone; uses RW to ambulate.                 Action/Plan: PT/OT evaluations pending.  Will follow for discharge planning as pt progresses.    Expected Discharge Date:                  Expected Discharge Plan:  Skilled Nursing Facility  In-House Referral:  Clinical Social Work  Discharge planning Services     Post Acute Care Choice:    Choice offered to:     DME Arranged:    DME Agency:     HH Arranged:    [place] Agency:     Status of Service:  In process, will continue to follow  If discussed at Long Length of Stay Meetings, dates discussed:    Additional Comments:  10/21/16 J. Everrett Lacasse, RN, BSN PT recommending SNF for rehab at discharge.  Referral to CSW to facilitate possible dc to SNF upon medical stability.  Will follow.    10/27/16 J. Narcissa Melder, RN, BSN MBS completed; Speech therapy recommending Dysphagia 3 (Mech soft) solids;Honey thick liquids.    Reinaldo Raddle, RN, BSN  Trauma/Neuro ICU Case Manager [phone]

## 2016-10-27 NOTE — Progress Notes (Signed)
Modified Barium Swallow Progress Note  Patient Details  Name: Jason [name] MRN: [id number] Date of Birth: [birth year]  Today's Date: 10/27/2016  Modified Barium Swallow completed.  Full report located under Chart Review in the Imaging Section.  Brief recommendations include the following:  Clinical Impression  Pt continues to present with dealyed swallow initiation, weak base of tongue retraction, and silent penetration and aspiration events during and after the swallow. Aspirates increase with secretions and are eventually sensed at they descend the trachea, expectorated with hard coughing. Pt is intermittnetly able to achieve an effective preventative throat clear or cough, but not consistently. Best methods to reduce penetration and pharyngeal residuals were cues for a deep chin tuck, chin squeezed to the chest, with hard effortful swallows x2 with soft solids, puree and honey thick liquids via spoon. Discussed and demosntrated strategy to pts daughter and encouraged continuing exercise program and oral care. Pt will have ongoing risk of aspiration with all PO intake, but hopeful for improved function with use of the swallow mechanism with careful precautions. Will follow to reinforce strategies.    Swallow Evaluation Recommendations       SLP Diet Recommendations: Dysphagia 3 (Mech soft) solids;Honey thick liquids   Liquid Administration via: Spoon   Medication Administration: Crushed with puree   Supervision: Staff to assist with self feeding;Full supervision/cueing for compensatory strategies;Comment (family to assist)   Compensations: Minimize environmental distractions;Clear throat intermittently;Effortful swallow;Chin tuck   Postural Changes: Seated upright at 90 degrees;Remain semi-upright after after feeds/meals (Comment)   Oral Care Recommendations: Oral care before and after PO   Other Recommendations: Have oral suction available   [name], MA CCC-SLP  [phone]  Kenyetta Wimbish, Katherene Ponto 10/27/2016,2:21 PM

## 2016-10-27 NOTE — Progress Notes (Signed)
[facility] Progress Note     Subjective: CC: no complaints Patient resting comfortably, states he feels sleepy but seems more alert than yesterday. Denies abdominal pain, n/v. Had a BM.   Objective: Vital signs in last 24 hours: Temp:  [98 F (36.7 C)-99 F (37.2 C)] 98 F (36.7 C) (07/26 0355) Pulse Rate:  [70-77] 70 (07/26 0355) Resp:  [18-20] 19 (07/26 0355) BP: (121-133)/(60-66) 133/61 (07/26 0355) SpO2:  [96 %-98 %] 97 % (07/26 0355) Last BM Date: 10/26/16  Intake/Output from previous day: 07/25 0701 - 07/26 0700 In: -  Out: 501 [Urine:500; Stool:1] Intake/Output this shift: No intake/output data recorded.  PE: Gen:  Alert, NAD, pleasant Card:  Regular rate and rhythm, pedal pulses 2+ BL Pulm:  Normal effort, clear to auscultation bilaterally Abd: Soft, non-tender, mildly distended, bowel sounds present no HSM MSK: left forearm in splint, left fingers warm and well perfused, sensation/motor in left fingers intact.  Skin: warm and dry, no rashes  Psych: A&Ox3   Lab Results:   Recent Labs  10/26/16 1305  WBC 13.9*  HGB 11.1*  HCT 34.3*  PLT 259   BMET  Recent Labs  10/26/16 1305  NA 132*  K 4.8  CL 103  CO2 21*  GLUCOSE 121*  BUN 31*  CREATININE 0.98  CALCIUM 8.4*   CMP     Component Value Date/Time   NA 132 (L) 10/26/2016 1305   NA 140 01/08/2013 0919   K 4.8 10/26/2016 1305   K 4.6 01/08/2013 0919   CL 103 10/26/2016 1305   CL 105 07/09/2012 0828   CO2 21 (L) 10/26/2016 1305   CO2 25 01/08/2013 0919   GLUCOSE 121 (H) 10/26/2016 1305   GLUCOSE 99 01/08/2013 0919   GLUCOSE 94 07/09/2012 0828   BUN 31 (H) 10/26/2016 1305   BUN 25.1 01/08/2013 0919   CREATININE 0.98 10/26/2016 1305   CREATININE 1.1 01/08/2013 0919   CALCIUM 8.4 (L) 10/26/2016 1305   CALCIUM 9.6 01/08/2013 0919   PROT 5.7 (L) 07/05/2016 0408   PROT 7.3 01/08/2013 0919   ALBUMIN 2.6 (L) 07/05/2016 0408   ALBUMIN 3.6 01/08/2013 0919   AST 34 07/05/2016  0408   AST 22 01/08/2013 0919   ALT 19 07/05/2016 0408   ALT 11 01/08/2013 0919   ALKPHOS 25 (L) 07/05/2016 0408   ALKPHOS 38 (L) 01/08/2013 0919   BILITOT 0.6 07/05/2016 0408   BILITOT 0.49 01/08/2013 0919   GFRNONAA >60 10/26/2016 1305   GFRAA >60 10/26/2016 1305    Anti-infectives: Anti-infectives    Start     Dose/Rate Route Frequency Ordered Stop   10/23/16 1000  amoxicillin (AMOXIL) capsule 500 mg     500 mg Oral Every 12 hours 10/23/16 0814 10/26/16 2345   10/19/16 2200  amoxicillin (AMOXIL) capsule 500 mg  Status:  Discontinued     500 mg Oral Every 12 hours 10/19/16 1822 10/23/16 2376       Assessment/Plan  Fall Mult L rib FX with PTX - PTX resolved,pulm toilet and BDs  T9-10 TP fxs, Nondisplaced T6 facet fx - NS signed off, will F/U in 4 weeks for repeat imaging  L trapezium FX- splint, hand surgery will F/U in office Caralyn Guile) Dysphagia- ST following, Cortrakplaced for TFs CV- resume home Eliquis  ID - resp CX negative, WBC 13.9, afebrile - d/c'd amoxicillin yesterday FEN- TF, SLP following - need to determine PO vs PEG  VTE- Eliquis  Dispo- SNF. Swallow study  pending, if unable to safely swallow will need to consider PEG.   LOS: 7 days    Brigid Re , [facility] Surgery 10/27/2016, 8:52 AM Pager: [phone] Trauma Pager: [phone] Mon-Fri 7:00 am-4:30 pm Sat-Sun 7:00 am-11:30 am

## 2016-10-27 NOTE — Progress Notes (Signed)
PT Cancellation Note  Patient Details Name: Jason [name] MRN: [id number] DOB: 1927/12/23   Cancelled Treatment:    Reason Eval/Treat Not Completed: Patient at procedure or test/unavailable. At barium swallow test. Will check back if time allows.  Benjiman Core, PTA Pager [phone] Acute Rehab   Allena Katz 10/27/2016, 2:03 PM

## 2016-10-27 NOTE — Progress Notes (Signed)
  Speech Language Pathology Treatment: Dysphagia  Patient Details Name: Jason [name] MRN: [id number] DOB: 1927/07/19 Today's Date: 10/27/2016 Time: 1050-1110 SLP Time Calculation (min) (ACUTE ONLY): 20 min  Assessment / Plan / Recommendation Clinical Impression  Pt fully alert, participated well in oral care and exercises with family before SLP arrival. Provided 4 oz of puree and cues for intermittent throat clear with excellent pt participation and minimal signs of residuals. Will proceed with MBS this pm for objective assessment of PO readiness. Pt and family in agreement.   HPI HPI: Patient is an 83 yo male who fell at home this morning while using his walker. He fell backwards into a piece of furniture and immediately felt pain in his left back and ribs. Patient is unsure of why he fell, thinks he may have just lost his footing. Patient denies hitting his head, LOC, dizziness, numbness/tingling, SOB, palpitations, blurred vision, abdominal pain, nausea, vomiting.  Patient also having pain in left hand over thumb, able to bend and grip still.  Patient does take Eliquis, last dose was this AM. Workup in the ED showed fractures of 9-11 left ribs and small left hemopneumothorax, and T9 and T10 TP fractures, also non-displaced fx of T6 facet.       SLP Plan  MBS       Recommendations                   Plan: MBS       GO               [name], [place] CCC-SLP [phone]  Lynann Beaver 10/27/2016, 11:16 AM

## 2016-10-28 DIAGNOSIS — H3530 Unspecified macular degeneration: Secondary | ICD-10-CM | POA: Diagnosis not present

## 2016-10-28 DIAGNOSIS — H04129 Dry eye syndrome of unspecified lacrimal gland: Secondary | ICD-10-CM | POA: Diagnosis not present

## 2016-10-28 DIAGNOSIS — S22068A Other fracture of T7-T8 thoracic vertebra, initial encounter for closed fracture: Secondary | ICD-10-CM | POA: Diagnosis not present

## 2016-10-28 DIAGNOSIS — S62175D Nondisplaced fracture of trapezium [larger multangular], left wrist, subsequent encounter for fracture with routine healing: Secondary | ICD-10-CM | POA: Diagnosis not present

## 2016-10-28 DIAGNOSIS — I69398 Other sequelae of cerebral infarction: Secondary | ICD-10-CM | POA: Diagnosis not present

## 2016-10-28 DIAGNOSIS — Z9181 History of falling: Secondary | ICD-10-CM | POA: Diagnosis not present

## 2016-10-28 DIAGNOSIS — R2689 Other abnormalities of gait and mobility: Secondary | ICD-10-CM | POA: Diagnosis not present

## 2016-10-28 DIAGNOSIS — K5900 Constipation, unspecified: Secondary | ICD-10-CM | POA: Diagnosis not present

## 2016-10-28 DIAGNOSIS — R1312 Dysphagia, oropharyngeal phase: Secondary | ICD-10-CM | POA: Diagnosis not present

## 2016-10-28 DIAGNOSIS — C8580 Other specified types of non-Hodgkin lymphoma, unspecified site: Secondary | ICD-10-CM | POA: Diagnosis not present

## 2016-10-28 DIAGNOSIS — I480 Paroxysmal atrial fibrillation: Secondary | ICD-10-CM | POA: Diagnosis not present

## 2016-10-28 DIAGNOSIS — Z8673 Personal history of transient ischemic attack (TIA), and cerebral infarction without residual deficits: Secondary | ICD-10-CM | POA: Diagnosis not present

## 2016-10-28 DIAGNOSIS — Z7901 Long term (current) use of anticoagulants: Secondary | ICD-10-CM | POA: Diagnosis not present

## 2016-10-28 DIAGNOSIS — N183 Chronic kidney disease, stage 3 (moderate): Secondary | ICD-10-CM | POA: Diagnosis not present

## 2016-10-28 DIAGNOSIS — K2270 Barrett's esophagus without dysplasia: Secondary | ICD-10-CM | POA: Diagnosis not present

## 2016-10-28 DIAGNOSIS — R4789 Other speech disturbances: Secondary | ICD-10-CM | POA: Diagnosis not present

## 2016-10-28 DIAGNOSIS — K449 Diaphragmatic hernia without obstruction or gangrene: Secondary | ICD-10-CM | POA: Diagnosis not present

## 2016-10-28 DIAGNOSIS — S62175A Nondisplaced fracture of trapezium [larger multangular], left wrist, initial encounter for closed fracture: Secondary | ICD-10-CM | POA: Diagnosis not present

## 2016-10-28 DIAGNOSIS — G20 Parkinson's disease: Secondary | ICD-10-CM | POA: Diagnosis not present

## 2016-10-28 DIAGNOSIS — R278 Other lack of coordination: Secondary | ICD-10-CM | POA: Diagnosis not present

## 2016-10-28 DIAGNOSIS — N400 Enlarged prostate without lower urinary tract symptoms: Secondary | ICD-10-CM | POA: Diagnosis not present

## 2016-10-28 DIAGNOSIS — R262 Difficulty in walking, not elsewhere classified: Secondary | ICD-10-CM | POA: Diagnosis not present

## 2016-10-28 DIAGNOSIS — G8911 Acute pain due to trauma: Secondary | ICD-10-CM | POA: Diagnosis not present

## 2016-10-28 DIAGNOSIS — S5290XA Unspecified fracture of unspecified forearm, initial encounter for closed fracture: Secondary | ICD-10-CM | POA: Diagnosis not present

## 2016-10-28 DIAGNOSIS — E785 Hyperlipidemia, unspecified: Secondary | ICD-10-CM | POA: Diagnosis not present

## 2016-10-28 DIAGNOSIS — S62172D Displaced fracture of trapezium [larger multangular], left wrist, subsequent encounter for fracture with routine healing: Secondary | ICD-10-CM | POA: Diagnosis not present

## 2016-10-28 DIAGNOSIS — S2242XA Multiple fractures of ribs, left side, initial encounter for closed fracture: Secondary | ICD-10-CM | POA: Diagnosis not present

## 2016-10-28 DIAGNOSIS — S272XXA Traumatic hemopneumothorax, initial encounter: Secondary | ICD-10-CM | POA: Diagnosis not present

## 2016-10-28 DIAGNOSIS — M1712 Unilateral primary osteoarthritis, left knee: Secondary | ICD-10-CM | POA: Diagnosis not present

## 2016-10-28 DIAGNOSIS — K219 Gastro-esophageal reflux disease without esophagitis: Secondary | ICD-10-CM | POA: Diagnosis not present

## 2016-10-28 DIAGNOSIS — S22079A Unspecified fracture of T9-T10 vertebra, initial encounter for closed fracture: Secondary | ICD-10-CM | POA: Diagnosis not present

## 2016-10-28 DIAGNOSIS — G4700 Insomnia, unspecified: Secondary | ICD-10-CM | POA: Diagnosis not present

## 2016-10-28 DIAGNOSIS — F329 Major depressive disorder, single episode, unspecified: Secondary | ICD-10-CM | POA: Diagnosis not present

## 2016-10-28 DIAGNOSIS — M6281 Muscle weakness (generalized): Secondary | ICD-10-CM | POA: Diagnosis not present

## 2016-10-28 DIAGNOSIS — S2242XD Multiple fractures of ribs, left side, subsequent encounter for fracture with routine healing: Secondary | ICD-10-CM | POA: Diagnosis not present

## 2016-10-28 LAB — GLUCOSE, CAPILLARY
GLUCOSE-CAPILLARY: 109 mg/dL — AB (ref 65–99)
Glucose-Capillary: 113 mg/dL — ABNORMAL HIGH (ref 65–99)
Glucose-Capillary: 118 mg/dL — ABNORMAL HIGH (ref 65–99)
Glucose-Capillary: 97 mg/dL (ref 65–99)

## 2016-10-28 MED ORDER — PRO-STAT SUGAR FREE PO LIQD: 30.0000 mL | Freq: Every day | ORAL | 0 refills | Status: DC

## 2016-10-28 MED ORDER — TRAMADOL HCL 50 MG PO TABS: 50.0000 mg | ORAL_TABLET | Freq: Four times a day (QID) | ORAL | 0 refills | Status: DC

## 2016-10-28 MED ORDER — CARBIDOPA-LEVODOPA 10-100 MG PO TABS
1.0000 | ORAL_TABLET | Freq: Three times a day (TID) | ORAL | Status: DC
  Administered 2016-10-28: 1 via ORAL
  Filled 2016-10-28 (×3): qty 1

## 2016-10-28 NOTE — Progress Notes (Signed)
[facility] Progress Note     Subjective: CC:  L rib pain improving. Pulling 750 on IS. Patient does not remember eating dinner but, according to nurse tech, his daughter was helping him with dinner last night and he tolerated SOFT diet. No documented N/V.   Afebrile, VSS Objective: Vital signs in last 24 hours: Temp:  [98 F (36.7 C)-98.4 F (36.9 C)] 98 F (36.7 C) (07/27 0530) Pulse Rate:  [70-95] 91 (07/27 0530) Resp:  [19-20] 20 (07/27 0530) BP: (125-155)/(60-75) 136/60 (07/27 0530) SpO2:  [94 %-98 %] 94 % (07/27 0530) Weight:  [102.3 kg (225 lb 8.5 oz)] 102.3 kg (225 lb 8.5 oz) (07/27 0530) Last BM Date: 10/27/16  Intake/Output from previous day: 07/26 0701 - 07/27 0700 In: 3440 [I.V.:840; NG/GT:2600] Out: 725 [Urine:725] Intake/Output this shift: No intake/output data recorded.  PE: Gen:  Alert, NAD, pleasant Card:  Regular rate and rhythm, pedal pulses 2+ BL Pulm:  Normal effort, clear to auscultation bilaterally Abd: Soft, non-tender, bowel sounds present no HSM MSK: left forearm in splint, left fingers warm and well perfused, sensation/motor in left fingers intact.  Skin: warm and dry, no rashes  Psych: A&Ox3   Lab Results:   Recent Labs  10/26/16 1305  WBC 13.9*  HGB 11.1*  HCT 34.3*  PLT 259   BMET  Recent Labs  10/26/16 1305  NA 132*  K 4.8  CL 103  CO2 21*  GLUCOSE 121*  BUN 31*  CREATININE 0.98  CALCIUM 8.4*   PT/INR No results for input(s): LABPROT, INR in the last 72 hours. CMP     Component Value Date/Time   NA 132 (L) 10/26/2016 1305   NA 140 01/08/2013 0919   K 4.8 10/26/2016 1305   K 4.6 01/08/2013 0919   CL 103 10/26/2016 1305   CL 105 07/09/2012 0828   CO2 21 (L) 10/26/2016 1305   CO2 25 01/08/2013 0919   GLUCOSE 121 (H) 10/26/2016 1305   GLUCOSE 99 01/08/2013 0919   GLUCOSE 94 07/09/2012 0828   BUN 31 (H) 10/26/2016 1305   BUN 25.1 01/08/2013 0919   CREATININE 0.98 10/26/2016 1305   CREATININE 1.1  01/08/2013 0919   CALCIUM 8.4 (L) 10/26/2016 1305   CALCIUM 9.6 01/08/2013 0919   PROT 5.7 (L) 07/05/2016 0408   PROT 7.3 01/08/2013 0919   ALBUMIN 2.6 (L) 07/05/2016 0408   ALBUMIN 3.6 01/08/2013 0919   AST 34 07/05/2016 0408   AST 22 01/08/2013 0919   ALT 19 07/05/2016 0408   ALT 11 01/08/2013 0919   ALKPHOS 25 (L) 07/05/2016 0408   ALKPHOS 38 (L) 01/08/2013 0919   BILITOT 0.6 07/05/2016 0408   BILITOT 0.49 01/08/2013 0919   GFRNONAA >60 10/26/2016 1305   GFRAA >60 10/26/2016 1305   Lipase     Component Value Date/Time   LIPASE 39 11/02/2015 0227       Studies/Results: Dg Swallowing Func-speech Pathology  Result Date: 10/27/2016 Objective Swallowing Evaluation: Type of Study: MBS-Modified Barium Swallow Study Patient Details Name: FREDDRICK GLADSON MRN: [id number] Date of Birth: [birth year] Today's Date: 10/27/2016 Time: SLP Start Time (ACUTE ONLY): 1330-SLP Stop Time (ACUTE ONLY): 1400 SLP Time Calculation (min) (ACUTE ONLY): 30 min Past Medical History: Past Medical History: Diagnosis Date . Arthritis  . Barrett esophagus  . Bladder cancer ([place]) dx'd 6503,5465  surg only . Colon polyps   adenomatous . Difficulty sleeping   OCCASIONALLY . Diverticulosis of colon (without mention of hemorrhage)  .  Frequency of urination  . GERD (gastroesophageal reflux disease)  . Hiatal hernia  . History of bladder cancer 1981  EXCISION ONLY . History of skin cancer   BASAL CELL . Hyperlipidemia  . Lymphoma, non-Hodgkin's ([place]) 2011  [place]  . Macular degeneration   LEFT EYE . Multiple nodules of lung 09/16/2011 . PAF (paroxysmal atrial fibrillation) ([place])  . Personal history of colonic polyps 07/15/2010  tubular adenoma . Prostate cancer [facility])   patient denies . PUD (peptic ulcer disease)  . Stomach cancer ([place])   NHL origin . Stroke ([place])  . Weakness  Past Surgical History: Past Surgical History: Procedure Laterality Date . APPENDECTOMY   . bone spur    left shoulder . CHOLECYSTECTOMY   . KNEE  ARTHROPLASTY Left 07/30/2015  Procedure: LEFT TOTAL KNEE ARTHROPLASTY WITH COMPUTER NAVIGATION;  Surgeon: Rod Can, MD;  Location: [facility];  Service: Orthopedics;  Laterality: Left; . LASER OF PROSTATE W/ GREEN LIGHT PVP   . TENDON REPAIR    right arm . TRIGGER FINGER RELEASE    bilateral hands . WRIST SURGERY    Bilateral HPI: Patient is an 83 yo male who fell at home this morning while using his walker. He fell backwards into a piece of furniture and immediately felt pain in his left back and ribs. Patient is unsure of why he fell, thinks he may have just lost his footing. Patient denies hitting his head, LOC, dizziness, numbness/tingling, SOB, palpitations, blurred vision, abdominal pain, nausea, vomiting.  Patient also having pain in left hand over thumb, able to bend and grip still.  Patient does take Eliquis, last dose was this AM. Workup in the ED showed fractures of 9-11 left ribs and small left hemopneumothorax, and T9 and T10 TP fractures, also non-displaced fx of T6 facet.  No Data Recorded Assessment / Plan / Recommendation CHL IP CLINICAL IMPRESSIONS 10/27/2016 Clinical Impression Pt continues to present with dealyed swallow initiation, weak base of tongue retraction, and silent penetration and aspiration events during and after the swallow. Aspirates increase with secretions and are eventually sensed at they descend the trachea, expectorated with hard coughing. Pt is intermittnetly able to achieve an effective preventative throat clear or cough, but not consistently. Best methods to reduce penetration and pharyngeal residuals were cues for a deep chin tuck, chin squeezed to the chest, with hard effortful swallows x2 with soft solids, puree and honey thick liquids via spoon. Discussed and demosntrated strategy to pts daughter and encouraged continuing exercise program and oral care. Pt will have ongoing risk of aspiration with all PO intake, but hopeful for improved function with use of the swallow  mechanism with careful precautions. Will follow to reinforce strategies.  SLP Visit Diagnosis Dysphagia, oropharyngeal phase (R13.12) Attention and concentration deficit following -- Frontal lobe and executive function deficit following -- Impact on safety and function Moderate aspiration risk   CHL IP TREATMENT RECOMMENDATION 10/27/2016 Treatment Recommendations Therapy as outlined in treatment plan below   Prognosis 10/27/2016 Prognosis for Safe Diet Advancement Good Barriers to Reach Goals -- Barriers/Prognosis Comment -- CHL IP DIET RECOMMENDATION 10/27/2016 SLP Diet Recommendations Dysphagia 3 (Mech soft) solids;Honey thick liquids Liquid Administration via Spoon Medication Administration Crushed with puree Compensations Minimize environmental distractions;Clear throat intermittently;Effortful swallow;Chin tuck Postural Changes Seated upright at 90 degrees;Remain semi-upright after after feeds/meals (Comment)   CHL IP OTHER RECOMMENDATIONS 10/27/2016 Recommended Consults -- Oral Care Recommendations Oral care before and after PO Other Recommendations Have oral suction available  CHL IP FOLLOW UP RECOMMENDATIONS 10/27/2016 Follow up Recommendations Skilled Nursing facility   [facility] IP FREQUENCY AND DURATION 10/27/2016 Speech Therapy Frequency (ACUTE ONLY) min 2x/week Treatment Duration 2 weeks      CHL IP ORAL PHASE 10/27/2016 Oral Phase WFL Oral - Pudding Teaspoon -- Oral - Pudding Cup -- Oral - Honey Teaspoon -- Oral - Honey Cup -- Oral - Nectar Teaspoon -- Oral - Nectar Cup -- Oral - Nectar Straw -- Oral - Thin Teaspoon -- Oral - Thin Cup -- Oral - Thin Straw -- Oral - Puree -- Oral - Mech Soft -- Oral - Regular -- Oral - Multi-Consistency -- Oral - Pill -- Oral Phase - Comment --  CHL IP PHARYNGEAL PHASE 10/27/2016 Pharyngeal Phase Impaired Pharyngeal- Pudding Teaspoon -- Pharyngeal -- Pharyngeal- Pudding Cup -- Pharyngeal -- Pharyngeal- Honey Teaspoon Penetration/Apiration after swallow;Penetration/Aspiration during  swallow;Moderate aspiration;Trace aspiration;Delayed swallow initiation-vallecula;Pharyngeal residue - valleculae;Pharyngeal residue - pyriform;Compensatory strategies attempted (with notebox);Reduced tongue base retraction Pharyngeal Material does not enter airway;Material enters airway, passes BELOW cords without attempt by patient to eject out (silent aspiration);Material enters airway, CONTACTS cords and not ejected out Pharyngeal- Honey Cup Penetration/Apiration after swallow;Penetration/Aspiration during swallow;Moderate aspiration;Trace aspiration;Delayed swallow initiation-vallecula;Pharyngeal residue - valleculae;Pharyngeal residue - pyriform;Compensatory strategies attempted (with notebox);Reduced tongue base retraction Pharyngeal Material enters airway, passes BELOW cords without attempt by patient to eject out (silent aspiration) Pharyngeal- Nectar Teaspoon Penetration/Apiration after swallow;Penetration/Aspiration during swallow;Moderate aspiration;Trace aspiration;Delayed swallow initiation-vallecula;Pharyngeal residue - valleculae;Pharyngeal residue - pyriform;Compensatory strategies attempted (with notebox);Reduced tongue base retraction Pharyngeal Material enters airway, passes BELOW cords without attempt by patient to eject out (silent aspiration);Material enters airway, CONTACTS cords and not ejected out Pharyngeal- Nectar Cup NT Pharyngeal -- Pharyngeal- Nectar Straw -- Pharyngeal -- Pharyngeal- Thin Teaspoon NT Pharyngeal -- Pharyngeal- Thin Cup -- Pharyngeal -- Pharyngeal- Thin Straw -- Pharyngeal -- Pharyngeal- Puree Pharyngeal residue - valleculae;Delayed swallow initiation-vallecula;Reduced tongue base retraction Pharyngeal -- Pharyngeal- Mechanical Soft -- Pharyngeal -- Pharyngeal- Regular -- Pharyngeal -- Pharyngeal- Multi-consistency -- Pharyngeal -- Pharyngeal- Pill -- Pharyngeal -- Pharyngeal Comment --  CHL IP CERVICAL ESOPHAGEAL PHASE 10/20/2016 Cervical Esophageal Phase WFL Pudding  Teaspoon -- Pudding Cup -- Honey Teaspoon -- Honey Cup -- Nectar Teaspoon -- Nectar Cup -- Nectar Straw -- Thin Teaspoon -- Thin Cup -- Thin Straw -- Puree -- Mechanical Soft -- Regular -- Multi-consistency -- Pill -- Cervical Esophageal Comment -- CHL IP GO 10/20/2016 Functional Assessment Tool Used skilled clinical judgement Functional Limitations Swallowing Swallow Current Status (Q7591) CL Swallow Goal Status (M3846) CJ Swallow Discharge Status (K5993) (None) Motor Speech Current Status (T7017) (None) Motor Speech Goal Status (B9390) (None) Motor Speech Goal Status (Z0092) (None) Spoken Language Comprehension Current Status (Z3007) (None) Spoken Language Comprehension Goal Status (M2263) (None) Spoken Language Comprehension Discharge Status (F3545) (None) Spoken Language Expression Current Status (G2563) (None) Spoken Language Expression Goal Status (S9373) (None) Spoken Language Expression Discharge Status [phone]) (None) Attention Current Status (O1157) (None) Attention Goal Status (W6203) (None) Attention Discharge Status (T5974) (None) Memory Current Status (B6384) (None) Memory Goal Status (T3646) (None) Memory Discharge Status (O0321) (None) Voice Current Status (Y2482) (None) Voice Goal Status (N0037) (None) Voice Discharge Status (C4888) (None) Other Speech-Language Pathology Functional Limitation Current Status (B1694) (None) Other Speech-Language Pathology Functional Limitation Goal Status (H0388) (None) Other Speech-Language Pathology Functional Limitation Discharge Status [phone]) (None) [name], MA CCC-SLP [phone] [place], Katherene Ponto 10/27/2016, 2:23 PM               Anti-infectives: Anti-infectives  Start     Dose/Rate Route Frequency Ordered Stop   10/23/16 1000  amoxicillin (AMOXIL) capsule 500 mg     500 mg Oral Every 12 hours 10/23/16 0814 10/26/16 2345   10/19/16 2200  amoxicillin (AMOXIL) capsule 500 mg  Status:  Discontinued     500 mg Oral Every 12 hours 10/19/16 1822  10/23/16 6286       Assessment/Plan Fall Mult L rib FX with PTX - PTX resolved,pulm toilet and BDs  T9-10 TP fxs, Nondisplaced T6 facet fx - NS signed off, will F/U in 4 weeks for repeat imaging  L trapezium FX- splint, hand surgery will F/U in office Caralyn Guile) Dysphagia- ST following, MBS repeated 7/26 and now on D3 (mech soft) diet w/ honey thick liquids. meds crushes w/ puree. CV- Eliquis  ID - resp CX negative, WBC 13.9, afebrile - d/c'd amoxicillin 7/25 FEN- DYS 3 diet, honey thick liquids, SLP following;  VTE- Eliquis Pain - ULTRAM 50 mg q 6h, tylenol PRN  Dispo- medically stable for SNF   LOS: 8 days    Jill Alexanders , [facility] 10/28/2016, 7:30 AM Pager: [phone] Consults: [phone] Mon-Fri 7:00 am-4:30 pm Sat-Sun 7:00 am-11:30 am

## 2016-10-28 NOTE — Care Management Important Message (Addendum)
Important Message  Patient Details  Name: Jason [name] MRN: [id number] Date of Birth: 09-16-1927   Medicare Important Message Given:  Yes    Ella Bodo, RN 10/28/2016, 4:33 PM

## 2016-10-28 NOTE — Discharge Summary (Signed)
[facility] Discharge Summary   Patient ID: Jason [name] MRN: [id number] DOB/AGE: [birth year] 83 y.o.  Admit date: 10/19/2016 Discharge date: 10/28/2016  Discharge Diagnosis Patient Active Problem List   Diagnosis Date Noted  . Fall at home 10/19/2016  . Gait abnormality 09/05/2016  . Parkinsonism ([place]) 09/05/2016  . CVA (cerebral vascular accident) ([place]) 07/01/2016  . Dysarthria   . History of cancer   . TIA (transient ischemic attack) 06/30/2016  . Hemiplegia affecting dominant side ([place]) 06/30/2016  . History of bladder cancer   . Dysphagia   . Hyperglycemia   . Stage 3 chronic kidney disease   . Acute blood loss anemia   . Leukocytosis   . Weakness   . Community acquired pneumonia 06/16/2016  . Unresponsiveness   . CAP (community acquired pneumonia) 06/15/2016  . PAF (paroxysmal atrial fibrillation) ([place]) 06/15/2016  . Pressure injury of skin 06/15/2016  . Lobar pneumonia ([place])   . Acute congestive heart failure ([place])   . Primary osteoarthritis of left knee 07/30/2015  . BPH (benign prostatic hyperplasia) 07/16/2012  . Multiple nodules of lung 09/16/2011  . GERD (gastroesophageal reflux disease) 06/14/2011  . Constipation, chronic 06/14/2011  . NHL (non-Hodgkin's lymphoma) ([place]) 01/03/2010  . Malignant neoplasm of bladder ([place]) 05/28/2007  . Hyperlipidemia 05/28/2007  . HIATAL HERNIA 05/28/2007  . BARRETT'S ESOPHAGUS, HX OF 05/28/2007   Consultants Neurosurgery - Dr. Dominica Severin Cream Orthopedic hand surgery - Dr. Iran Planas  Procedures None  HPI:  Patient is an 83 yo male who fell at home this morning while using his walker. He fell backwards into a piece of furniture and immediately felt pain in his left back and ribs. Patient is unsure of why he fell, thinks he may have just lost his footing. Patient denies hitting his head, LOC, dizziness, numbness/tingling, SOB, palpitations, blurred vision, abdominal pain, nausea, vomiting.  Patient also having  pain in left hand over thumb, able to bend and grip still.  Patient does take Eliquis, last dose was this AM. Workup in the ED showed fractures of 9-11 left ribs and small left hemopneumothorax, and T9 and T10 TP fractures, also non-displaced fx of T6 facet.   Hospital Course: Patient admitted for observation, pain control, and aggressive pulmonary toilet, as well as the above consults. Neurosurgery determined the patients injuries were non-operative and there was no role for thoracic bracing. They plan to follow up with the patient 4 weeks after discharge for repeat imaging and follow up. Orthopedic surgery recommended thumb spica for trapezium fracture and outpatient follow up with Dr. Caralyn Guile. Patients hospitalization was complicated by dysphagia requiring temporary tube feedings, however on 10/27/16 the patient underwent MBS and was advanced to a Dysphagia 3 (Mech soft) solids/Honey thick liquids diet. On 7/27 the patient was tolerating a diet, pain well controlled, vital signs stable and felt stable for discharge to SNF.    Allergies as of 10/28/2016   No Known Allergies     Medication List    STOP taking these medications   amoxicillin 875 MG tablet Commonly known as:  AMOXIL     TAKE these medications   benzonatate 100 MG capsule Commonly known as:  TESSALON Take 100 mg by mouth every 8 (eight) hours as needed for cough.   carbidopa-levodopa 25-100 MG tablet Commonly known as:  SINEMET IR Take 1 tablet by mouth 3 (three) times daily.   COLACE PO Take 100 mg by mouth at bedtime.   ELIQUIS 5 MG Tabs tablet Generic  drug:  apixaban Take 5 mg by mouth 2 (two) times daily.   esomeprazole 40 MG capsule Commonly known as:  NEXIUM TAKE 1 CAPSULE (40 MG TOTAL) BY MOUTH 2 (TWO) TIMES DAILY BEFORE A MEAL.   feeding supplement (PRO-STAT SUGAR FREE 64) Liqd Take 30 mLs by mouth daily.   fish oil-omega-3 fatty acids 1000 MG capsule Take 1 g by mouth 2 (two) times daily.   ICAPS MV  PO Take 1 capsule by mouth 2 (two) times daily.   senna 8.6 MG tablet Commonly known as:  SENOKOT Take 1 tablet by mouth daily.   simvastatin 40 MG tablet Commonly known as:  ZOCOR Take 40 mg by mouth at bedtime.   SYSTANE 0.4-0.3 % Soln Generic drug:  Polyethyl Glycol-Propyl Glycol Place 1 drop into both eyes daily as needed (for dry eyes).   tamsulosin 0.4 MG Caps capsule Commonly known as:  FLOMAX Take 0.4 mg by mouth at bedtime.   traMADol 50 MG tablet Commonly known as:  ULTRAM Take 1 tablet (50 mg total) by mouth every 6 (six) hours.   traZODone 50 MG tablet Commonly known as:  DESYREL Take 50 mg by mouth at bedtime as needed for sleep.         Contact information for follow-up providers    Iran Planas, MD. Schedule an appointment as soon as possible for a visit in 2 week(s).   Specialty:  Orthopedic Surgery Contact information: [address] [place] [address] [phone]        Kary Kos, MD. Schedule an appointment as soon as possible for a visit in 4 week(s).   Specialty:  Neurosurgery Why:  for repeat imaging and neurosurgery follow up Contact information: [address] [place] [address] [phone]            Contact information for after-discharge care    Destination    [name] Follow up.   Specialty:  Skilled Nursing Facility Contact information: [address] [place] [phone]                 Signed: Obie Dredge, [facility] 10/28/2016, 10:56 AM Pager: [phone] Consults: [phone] Mon-Fri 7:00 am-4:30 pm Sat-Sun 7:00 am-11:30 am

## 2016-10-28 NOTE — NC FL2 (Signed)
[place] LEVEL OF CARE SCREENING TOOL     IDENTIFICATION  Patient Name: Jason [name] Birthdate: Jan 17, 1928 Sex: male Admission Date (Current Location): 10/19/2016  [facility] and [place] Number:  Herbalist and Address:  [facility], [place] [address] [place], [place] [address]      Provider Number: [id number]  Attending Physician Name and Address:  Md, Trauma, MD  Relative Name and Phone Number:       Current Level of Care: Hospital Recommended Level of Care: [place] Prior Approval Number:    Date Approved/Denied:   PASRR Number: [id number]  Discharge Plan: SNF    Current Diagnoses: Patient Active Problem List   Diagnosis Date Noted  . Fall at home 10/19/2016  . Gait abnormality 09/05/2016  . Parkinsonism ([place]) 09/05/2016  . CVA (cerebral vascular accident) ([place]) 07/01/2016  . Dysarthria   . History of cancer   . TIA (transient ischemic attack) 06/30/2016  . Hemiplegia affecting dominant side ([place]) 06/30/2016  . History of bladder cancer   . Dysphagia   . Hyperglycemia   . Stage 3 chronic kidney disease   . Acute blood loss anemia   . Leukocytosis   . Weakness   . Community acquired pneumonia 06/16/2016  . Unresponsiveness   . CAP (community acquired pneumonia) 06/15/2016  . PAF (paroxysmal atrial fibrillation) ([place]) 06/15/2016  . Pressure injury of skin 06/15/2016  . Lobar pneumonia ([place])   . Acute congestive heart failure ([place])   . Primary osteoarthritis of left knee 07/30/2015  . BPH (benign prostatic hyperplasia) 07/16/2012  . Multiple nodules of lung 09/16/2011  . GERD (gastroesophageal reflux disease) 06/14/2011  . Constipation, chronic 06/14/2011  . NHL (non-Hodgkin's lymphoma) ([place]) 01/03/2010  . Malignant neoplasm of bladder ([place]) 05/28/2007  . Hyperlipidemia 05/28/2007  . HIATAL HERNIA 05/28/2007  . BARRETT'S ESOPHAGUS, HX OF 05/28/2007    Orientation RESPIRATION BLADDER  Height & Weight     Self, Situation, Place  O2 (2L) Continent, External catheter Weight: 225 lb 8.5 oz (102.3 kg) Height:  5\' 10"  (177.8 cm)  BEHAVIORAL SYMPTOMS/MOOD NEUROLOGICAL BOWEL NUTRITION STATUS      Continent Diet (Dysphagia 3 with honey thick liquids)  AMBULATORY STATUS COMMUNICATION OF NEEDS Skin   Extensive Assist Verbally Normal                       Personal Care Assistance Level of Assistance  Bathing, Feeding, Dressing Bathing Assistance: Limited assistance Feeding assistance: Limited assistance Dressing Assistance: Limited assistance     Functional Limitations Info  Sight, Hearing, Speech Sight Info: Adequate Hearing Info: Adequate Speech Info: Adequate    SPECIAL CARE FACTORS FREQUENCY  PT (By licensed PT), OT (By licensed OT), Speech therapy     PT Frequency: 5x OT Frequency: 5x            Contractures Contractures Info: Not present    Additional Factors Info  Code Status, Allergies, Isolation Precautions Code Status Info: Full Code Allergies Info: NKA     Isolation Precautions Info: MRSA     Current Medications (10/28/2016):  This is the current hospital active medication list Current Facility-Administered Medications  Medication Dose Route Frequency Provider Last Rate Last Dose  . acetaminophen (TYLENOL) tablet 650 mg  650 mg Oral Q6H PRN Rayburn, Kelly A, PA-C   650 mg at 10/20/16 0204  . apixaban (ELIQUIS) tablet 5 mg  5 mg Oral BID Jaquita Folds, RPH      .  benzonatate (TESSALON) capsule 100 mg  100 mg Oral Q8H PRN Rayburn, Kelly A, PA-C      . bisacodyl (DULCOLAX) suppository 10 mg  10 mg Rectal Daily PRN Judeth Horn, MD      . carbidopa-levodopa (SINEMET) 10-100 MG/5ML oral suspension 5 mL  5 mL Per Tube TID Georganna Skeans, MD   5 mL at 10/27/16 2231  . dextrose 5 % and 0.45 % NaCl with KCl 10 mEq/L infusion   Intravenous Continuous Rayburn, Kelly A, PA-C 50 mL/hr at 10/28/16 7371    . docusate (COLACE) 50 MG/5ML liquid 100 mg   100 mg Oral QHS Rayburn, Kelly A, PA-C   100 mg at 10/27/16 2230  . feeding supplement (JEVITY 1.2 CAL) liquid 1,000 mL  1,000 mL Per Tube Continuous Georganna Skeans, MD 65 mL/hr at 10/26/16 1636 1,000 mL at 10/26/16 1636  . feeding supplement (PRO-STAT SUGAR FREE 64) liquid 30 mL  30 mL Oral Daily Hiatt, Kendra P, RPH      . haloperidol lactate (HALDOL) injection 5 mg  5 mg Intravenous Q4H PRN Georganna Skeans, MD   5 mg at 10/25/16 2154  . hydrALAZINE (APRESOLINE) injection 10 mg  10 mg Intravenous Q2H PRN Rayburn, Kelly A, PA-C      . ipratropium-albuterol (DUONEB) 0.5-2.5 (3) MG/3ML nebulizer solution 3 mL  3 mL Nebulization Q2H PRN Rayburn, Kelly A, PA-C      . MEDLINE mouth rinse  15 mL Mouth Rinse BID Georganna Skeans, MD   15 mL at 10/27/16 2200  . ondansetron (ZOFRAN-ODT) disintegrating tablet 4 mg  4 mg Oral Q6H PRN Rayburn, Kelly A, PA-C       Or  . ondansetron (ZOFRAN) injection 4 mg  4 mg Intravenous Q6H PRN Rayburn, Kelly A, PA-C   4 mg at 10/19/16 1636  . pantoprazole (PROTONIX) EC tablet 40 mg  40 mg Oral Daily Hiatt, Kendra P, RPH      . polyvinyl alcohol (LIQUIFILM TEARS) 1.4 % ophthalmic solution 1 drop  1 drop Both Eyes PRN Rozann Lesches, RPH      . [place]   Oral PRN Judeth Horn, MD      . senna (SENOKOT) tablet 8.6 mg  1 tablet Oral Daily Jaquita Folds, RPH      . simvastatin (ZOCOR) tablet 40 mg  40 mg Oral QHS Hiatt, Kendra P, RPH      . traMADol (ULTRAM) tablet 50 mg  50 mg Oral Q6H Hiatt, Kendra P, RPH   50 mg at 10/28/16 0626     Discharge Medications: Please see discharge summary for a list of discharge medications.  Relevant Imaging Results:  Relevant Lab Results:   Additional Information SS#: [id number]   Barbette Or, [place]

## 2016-10-28 NOTE — Progress Notes (Signed)
Patient report was called to [facility]. Patient was discharged and transported by [facility].

## 2016-10-28 NOTE — Clinical Social Work Placement (Signed)
   CLINICAL SOCIAL WORK PLACEMENT  NOTE  Date:  10/28/2016  Patient Details  Name: Jason [name] MRN: [id number] Date of Birth: [birth year]  Clinical Social Work is seeking post-discharge placement for this patient at the [place] level of care (*CSW will initial, date and re-position this form in  chart as items are completed):  Yes   Patient/family provided with [place] Work Department's list of facilities offering this level of care within the geographic area requested by the patient (or if unable, by the patient's family).  Yes   Patient/family informed of their freedom to choose among providers that offer the needed level of care, that participate in Medicare, Medicaid or managed care program needed by the patient, have an available bed and are willing to accept the patient.  Yes   Patient/family informed of [facility]'s ownership interest in [facility] and [facility], as well as of the fact that they are under no obligation to receive care at these facilities.  PASRR submitted to EDS on       PASRR number received on       Existing PASRR number confirmed on 10/22/16     FL2 transmitted to all facilities in geographic area requested by pt/family on 10/22/16     FL2 transmitted to all facilities within larger geographic area on       Patient informed that his/her managed care company has contracts with or will negotiate with certain facilities, including the following:        Yes   Patient/family informed of bed offers received.  Patient chooses bed at [facility]     Physician recommends and patient chooses bed at      Patient to be transferred to [facility] on 10/28/16.  Patient to be transferred to facility by Ambulance     Patient family notified on 10/28/16 of transfer.  Name of family member notified:  Patient daughter at bedside     PHYSICIAN Please sign FL2     Additional Comment:   Barbette Or,  [place]

## 2016-10-28 NOTE — Discharge Instructions (Signed)
Information on my medicine - ELIQUIS (apixaban)  This medication education was reviewed with me or my healthcare representative as part of my discharge preparation.   Why was Eliquis prescribed for you? Eliquis was prescribed for you to reduce the risk of a blood clot forming that can cause a stroke if you have a medical condition called atrial fibrillation (a type of irregular heartbeat).  What do You need to know about Eliquis ? Take your Eliquis TWICE DAILY - one tablet in the morning and one tablet in the evening with or without food. If you have difficulty swallowing the tablet whole please discuss with your pharmacist how to take the medication safely.  Take Eliquis exactly as prescribed by your doctor and DO NOT stop taking Eliquis without talking to the doctor who prescribed the medication.  Stopping may increase your risk of developing a stroke.  Refill your prescription before you run out.  After discharge, you should have regular check-up appointments with your healthcare provider that is prescribing your Eliquis.  In the future your dose may need to be changed if your kidney function or weight changes by a significant amount or as you get older.  What do you do if you miss a dose? If you miss a dose, take it as soon as you remember on the same day and resume taking twice daily.  Do not take more than one dose of ELIQUIS at the same time to make up a missed dose.  Important Safety Information A possible side effect of Eliquis is bleeding. You should call your healthcare provider right away if you experience any of the following: ? Bleeding from an injury or your nose that does not stop. ? Unusual colored urine (red or dark brown) or unusual colored stools (red or black). ? Unusual bruising for unknown reasons. ? A serious fall or if you hit your head (even if there is no bleeding).  Some medicines may interact with Eliquis and might increase your risk of bleeding or  clotting while on Eliquis. To help avoid this, consult your healthcare provider or pharmacist prior to using any new prescription or non-prescription medications, including herbals, vitamins, non-steroidal anti-inflammatory drugs (NSAIDs) and supplements.  This website has more information on Eliquis (apixaban): http://www.eliquis.com/eliquis/home     Rib Fracture A rib fracture is a break or crack in one of the bones of the ribs. The ribs are a group of long, curved bones that wrap around your chest and attach to your spine. They protect your lungs and other organs in the chest cavity. A broken or cracked rib is often painful, but most do not cause other problems. Most rib fractures heal on their own over time. However, rib fractures can be more serious if multiple ribs are broken or if broken ribs move out of place and push against other structures. What are the causes?  A direct blow to the chest. For example, this could happen during contact sports, a car accident, or a fall against a hard object.  Repetitive movements with high force, such as pitching a baseball or having severe coughing spells. What are the signs or symptoms?  Pain when you breathe in or cough.  Pain when someone presses on the injured area. How is this diagnosed? Your caregiver will perform a physical exam. Various imaging tests may be ordered to confirm the diagnosis and to look for related injuries. These tests may include a chest X-ray, computed tomography (CT), magnetic resonance imaging (MRI), or a  bone scan. How is this treated? Rib fractures usually heal on their own in 1-3 months. The longer healing period is often associated with a continued cough or other aggravating activities. During the healing period, pain control is very important. Medication is usually given to control pain. Hospitalization or surgery may be needed for more severe injuries, such as those in which multiple ribs are broken or the ribs  have moved out of place. Follow these instructions at home:  Avoid strenuous activity and any activities or movements that cause pain. Be careful during activities and avoid bumping the injured rib.  Gradually increase activity as directed by your caregiver.  Only take over-the-counter or prescription medications as directed by your caregiver. Do not take other medications without asking your caregiver first.  Apply ice to the injured area for the first 1-2 days after you have been treated or as directed by your caregiver. Applying ice helps to reduce inflammation and pain. ? Put ice in a plastic bag. ? Place a towel between your skin and the bag. ? Leave the ice on for 15-20 minutes at a time, every 2 hours while you are awake.  Perform deep breathing as directed by your caregiver. This will help prevent pneumonia, which is a common complication of a broken rib. Your caregiver may instruct you to: ? Take deep breaths several times a day. ? Try to cough several times a day, holding a pillow against the injured area. ? Use a device called an incentive spirometer to practice deep breathing several times a day.  Drink enough fluids to keep your urine clear or pale yellow. This will help you avoid constipation.  Do not wear a rib belt or binder. These restrict breathing, which can lead to pneumonia. Get help right away if:  You have a fever.  You have difficulty breathing or shortness of breath.  You develop a continual cough, or you cough up thick or bloody sputum.  You feel sick to your stomach (nausea), throw up (vomit), or have abdominal pain.  You have worsening pain not controlled with medications. This information is not intended to replace advice given to you by your health care provider. Make sure you discuss any questions you have with your health care provider. Document Released: 03/21/2005 Document Revised: 08/27/2015 Document Reviewed: 05/23/2012 Elsevier Interactive  Patient Education  [employer].

## 2016-10-28 NOTE — Care Management Note (Signed)
Case Management Note  Patient Details  Name: Jason [name] MRN: [id number] Date of Birth: May 05, 1927  Subjective/Objective:  Pt admitted on 10/19/16 s/p ground level fall with multiple Lt rib fx with PTX, Lt trapezium fx, and thoracic spine fractures.  PTA, pt resides at home alone; uses RW to ambulate.                 Action/Plan: PT/OT evaluations pending.  Will follow for discharge planning as pt progresses.    Expected Discharge Date:  10/28/16               Expected Discharge Plan:  Skilled Nursing Facility  In-House Referral:  Clinical Social Work  Discharge planning Services     Post Acute Care Choice:    Choice offered to:     DME Arranged:    DME Agency:     HH Arranged:    [place] Agency:     Status of Service:  Completed, signed off  If discussed at [employer], dates discussed:    Additional Comments:  10/21/16 J. Mirca Yale, RN, BSN PT recommending SNF for rehab at discharge.  Referral to CSW to facilitate possible dc to SNF upon medical stability.  Will follow.    10/27/16 J. Korvin Valentine, RN, BSN MBS completed; Speech therapy recommending Dysphagia 3 (Mech soft) solids;Honey thick liquids.    10/28/16 J. Camala Talwar, RN, BSN Pt medically stable for discharge today.  Plan dc to [facility], per CSW arrangements.   Reinaldo Raddle, RN, BSN  Trauma/Neuro ICU Case Manager [phone]

## 2016-10-28 NOTE — Clinical Social Work Note (Signed)
Clinical Social Worker facilitated patient discharge including contacting patient family and facility to confirm patient discharge plans.  Clinical information faxed to facility and family agreeable with plan.  CSW arranged ambulance transport via [facility] to [name].  RN to call report prior to discharge.  Clinical Social Worker will sign off for now as social work intervention is no longer needed. Please consult us again if new need arises.  Jesse Tyshawna Alarid, LCSW [phone] 

## 2016-10-28 NOTE — Progress Notes (Signed)
  Speech Language Pathology Treatment: Dysphagia  Patient Details Name: Jason [name] MRN: [id number] DOB: 1927-08-10 Today's Date: 10/28/2016 Time: 0830-0900 SLP Time Calculation (min) (ACUTE ONLY): 30 min  Assessment / Plan / Recommendation Clinical Impression  Full assist with breakfast following MBS yesterday. Daughter present who is knowledgeable of MBS results and strategies. Pt required SLP directly by his side providing max consistent tactile cues for chin tuck, remove utensil from hand to decreased rate, small bite size and verbal cues throughout. Cues for chin tuck faded to moderate for frequency focusing on tighter tuck to chest (ROM is mildly limited). Delayed throat clearing, no cough, questionable wet vocal quality. Max-total reminders for second swallow. SLP educated daughter to ask pt to cough throughout meal. He will continue to be at higher aspiration risk and will need FULL assist with all meals.   HPI HPI: Patient is an 83 yo male who fell at home this morning while using his walker. He fell backwards into a piece of furniture and immediately felt pain in his left back and ribs. Patient is unsure of why he fell, thinks he may have just lost his footing. Patient denies hitting his head, LOC, dizziness, numbness/tingling, SOB, palpitations, blurred vision, abdominal pain, nausea, vomiting.  Patient also having pain in left hand over thumb, able to bend and grip still.  Patient does take Eliquis, last dose was this AM. Workup in the ED showed fractures of 9-11 left ribs and small left hemopneumothorax, and T9 and T10 TP fractures, also non-displaced fx of T6 facet.       SLP Plan  Continue with current plan of care       Recommendations  Diet recommendations: Dysphagia 3 (mechanical soft);Honey-thick liquid Liquids provided via: Teaspoon Medication Administration: Crushed with puree Supervision: Patient able to self feed;Full supervision/cueing for compensatory  strategies Compensations: Minimize environmental distractions;Clear throat intermittently;Effortful swallow;Chin tuck Postural Changes and/or Swallow Maneuvers: Seated upright 90 degrees                Oral Care Recommendations: Oral care BID Follow up Recommendations: Skilled Nursing facility SLP Visit Diagnosis: Dysphagia, oropharyngeal phase (R13.12) Plan: Continue with current plan of care       GO                [name] 10/28/2016, 9:10 AM  [name] [name].[employer] [phone]

## 2016-10-28 NOTE — Progress Notes (Signed)
Physical Therapy Treatment Patient Details Name: Jason [name] MRN: [id number] DOB: 02/22/1928 Today's Date: 10/28/2016    History of Present Illness 83 yo admitted after ground level fall at home with Left 10,11 rib fx, T9-10 TVP fx, left trapezium fx. PMHx: non-Hodgkin lymphoma, CA (bladder, skin and prostate), CVA, Lt TKA, macular degeneration, Afib    PT Comments    Pt continued to require increased assistance for bed mobs, transfer and short distance gait with increased cognitive deficits as compared to previous session. Pt requires cues to stay alert during session. SNF is the best place for patient at discharge due to current mobility deficits.     Follow Up Recommendations  SNF;Supervision/Assistance - 24 hour     Equipment Recommendations  Rolling walker with 5" wheels;Other (comment) (L platform)    Recommendations for Other Services       Precautions / Restrictions Precautions Precautions: Fall Restrictions Weight Bearing Restrictions: Yes LUE Weight Bearing: Weight bear through elbow only Other Position/Activity Restrictions: No orders; assumed d/t spica splint    Mobility  Bed Mobility Overal bed mobility: Needs Assistance Bed Mobility: Supine to Sit     Supine to sit: Max assist;+2 for physical assistance     General bed mobility comments: Pt does initate movement toward EOB, requires Max A +2 to scoot hips forward toward EOB  Transfers Overall transfer level: Needs assistance Equipment used: Left platform walker Transfers: Sit to/from Stand Sit to Stand: Mod assist;+2 physical assistance;+2 safety/equipment;From elevated surface         General transfer comment: Pt required mod assist x2 to initiate transfer to walker, once standing pt still required mod assist to maintain static standing due to pt leaning heavily to L.   Ambulation/Gait Ambulation/Gait assistance: Mod assist;+2 physical assistance;+2 safety/equipment Ambulation Distance (Feet):  10 Feet Assistive device: Left platform walker Gait Pattern/deviations: Step-through pattern;Decreased stance time - right;Decreased stride length;Decreased weight shift to right;Shuffle;Trunk flexed Gait velocity: decreased Gait velocity interpretation: Below normal speed for age/gender General Gait Details: Pt requires tactile and verbal cues for direction and to maintain upright posture.    Stairs            Wheelchair Mobility    Modified Rankin (Stroke Patients Only)       Balance Overall balance assessment: Needs assistance Sitting-balance support: Single extremity supported;Feet supported Sitting balance-Leahy Scale: Poor     Standing balance support: Bilateral upper extremity supported Standing balance-Leahy Scale: Poor                              Cognition Arousal/Alertness: Lethargic Behavior During Therapy: Flat affect Overall Cognitive Status: Impaired/Different from baseline Area of Impairment: Safety/judgement;Problem solving;Attention;Following commands                   Current Attention Level: Sustained   Following Commands: Follows one step commands with increased time Safety/Judgement: Decreased awareness of safety;Decreased awareness of deficits   Problem Solving: Slow processing;Difficulty sequencing;Requires verbal cues;Decreased initiation;Requires tactile cues General Comments: Pt is having hallucinations and increased cognitive deficits over past two days      Exercises      General Comments        Pertinent Vitals/Pain Pain Assessment: No/denies pain    Home Living                      Prior Function  PT Goals (current goals can now be found in the care plan section) Acute Rehab PT Goals Patient Stated Goal: to be able to perform better. Progress towards PT goals: Progressing toward goals    Frequency    Min 3X/week      PT Plan Current plan remains appropriate     Co-evaluation              AM-PAC PT "6 Clicks" Daily Activity  Outcome Measure  Difficulty turning over in bed (including adjusting bedclothes, sheets and blankets)?: Total Difficulty moving from lying on back to sitting on the side of the bed? : Total Difficulty sitting down on and standing up from a chair with arms (e.g., wheelchair, bedside commode, etc,.)?: Total Help needed moving to and from a bed to chair (including a wheelchair)?: A Lot Help needed walking in hospital room?: A Lot Help needed climbing 3-5 steps with a railing? : Total 6 Click Score: 8    End of Session Equipment Utilized During Treatment: Gait belt Activity Tolerance: Patient limited by fatigue Patient left: in chair;with call bell/phone within reach;with family/visitor present Nurse Communication: Mobility status;Need for lift equipment PT Visit Diagnosis: Unsteadiness on feet (R26.81);Difficulty in walking, not elsewhere classified (R26.2);Muscle weakness (generalized) (M62.81);History of falling (Z91.81);Other abnormalities of gait and mobility (R26.89)     Time: 2174-7159 PT Time Calculation (min) (ACUTE ONLY): 30 min  Charges:  $Gait Training: 8-22 mins $Therapeutic Activity: 8-22 mins                    G Codes:       Scheryl Marten PT, DPT  [phone]    Jacqulyn Liner Sloan Leiter 10/28/2016, 2:14 PM

## 2016-11-01 DIAGNOSIS — S2242XA Multiple fractures of ribs, left side, initial encounter for closed fracture: Secondary | ICD-10-CM | POA: Diagnosis not present

## 2016-11-01 DIAGNOSIS — S62175A Nondisplaced fracture of trapezium [larger multangular], left wrist, initial encounter for closed fracture: Secondary | ICD-10-CM | POA: Diagnosis not present

## 2016-11-10 ENCOUNTER — Ambulatory Visit: Payer: Medicare Other | Admitting: Neurology

## 2016-11-17 DIAGNOSIS — S62175D Nondisplaced fracture of trapezium [larger multangular], left wrist, subsequent encounter for fracture with routine healing: Secondary | ICD-10-CM | POA: Diagnosis not present

## 2016-11-17 DIAGNOSIS — S2242XD Multiple fractures of ribs, left side, subsequent encounter for fracture with routine healing: Secondary | ICD-10-CM | POA: Diagnosis not present

## 2016-11-21 DIAGNOSIS — S22068A Other fracture of T7-T8 thoracic vertebra, initial encounter for closed fracture: Secondary | ICD-10-CM | POA: Diagnosis not present

## 2016-11-25 DIAGNOSIS — S62172D Displaced fracture of trapezium [larger multangular], left wrist, subsequent encounter for fracture with routine healing: Secondary | ICD-10-CM | POA: Diagnosis not present

## 2016-11-25 DIAGNOSIS — K2270 Barrett's esophagus without dysplasia: Secondary | ICD-10-CM | POA: Diagnosis not present

## 2016-11-25 DIAGNOSIS — G20 Parkinson's disease: Secondary | ICD-10-CM | POA: Diagnosis not present

## 2016-11-25 DIAGNOSIS — S2242XD Multiple fractures of ribs, left side, subsequent encounter for fracture with routine healing: Secondary | ICD-10-CM | POA: Diagnosis not present

## 2016-12-02 DIAGNOSIS — S62172D Displaced fracture of trapezium [larger multangular], left wrist, subsequent encounter for fracture with routine healing: Secondary | ICD-10-CM | POA: Diagnosis not present

## 2016-12-02 DIAGNOSIS — S2242XD Multiple fractures of ribs, left side, subsequent encounter for fracture with routine healing: Secondary | ICD-10-CM | POA: Diagnosis not present

## 2016-12-02 DIAGNOSIS — G20 Parkinson's disease: Secondary | ICD-10-CM | POA: Diagnosis not present

## 2016-12-02 DIAGNOSIS — Z9181 History of falling: Secondary | ICD-10-CM | POA: Diagnosis not present

## 2016-12-27 DIAGNOSIS — G20 Parkinson's disease: Secondary | ICD-10-CM | POA: Diagnosis not present

## 2016-12-27 DIAGNOSIS — M6281 Muscle weakness (generalized): Secondary | ICD-10-CM | POA: Diagnosis not present

## 2016-12-27 DIAGNOSIS — R262 Difficulty in walking, not elsewhere classified: Secondary | ICD-10-CM | POA: Diagnosis not present

## 2016-12-27 DIAGNOSIS — R278 Other lack of coordination: Secondary | ICD-10-CM | POA: Diagnosis not present

## 2016-12-28 DIAGNOSIS — R262 Difficulty in walking, not elsewhere classified: Secondary | ICD-10-CM | POA: Diagnosis not present

## 2016-12-28 DIAGNOSIS — M6281 Muscle weakness (generalized): Secondary | ICD-10-CM | POA: Diagnosis not present

## 2016-12-28 DIAGNOSIS — R278 Other lack of coordination: Secondary | ICD-10-CM | POA: Diagnosis not present

## 2016-12-28 DIAGNOSIS — G20 Parkinson's disease: Secondary | ICD-10-CM | POA: Diagnosis not present

## 2016-12-29 DIAGNOSIS — M6281 Muscle weakness (generalized): Secondary | ICD-10-CM | POA: Diagnosis not present

## 2016-12-29 DIAGNOSIS — R278 Other lack of coordination: Secondary | ICD-10-CM | POA: Diagnosis not present

## 2016-12-29 DIAGNOSIS — G20 Parkinson's disease: Secondary | ICD-10-CM | POA: Diagnosis not present

## 2016-12-29 DIAGNOSIS — R262 Difficulty in walking, not elsewhere classified: Secondary | ICD-10-CM | POA: Diagnosis not present

## 2016-12-30 DIAGNOSIS — R278 Other lack of coordination: Secondary | ICD-10-CM | POA: Diagnosis not present

## 2016-12-30 DIAGNOSIS — G20 Parkinson's disease: Secondary | ICD-10-CM | POA: Diagnosis not present

## 2016-12-30 DIAGNOSIS — R262 Difficulty in walking, not elsewhere classified: Secondary | ICD-10-CM | POA: Diagnosis not present

## 2016-12-30 DIAGNOSIS — M6281 Muscle weakness (generalized): Secondary | ICD-10-CM | POA: Diagnosis not present

## 2017-01-02 DIAGNOSIS — R262 Difficulty in walking, not elsewhere classified: Secondary | ICD-10-CM | POA: Diagnosis not present

## 2017-01-02 DIAGNOSIS — R278 Other lack of coordination: Secondary | ICD-10-CM | POA: Diagnosis not present

## 2017-01-02 DIAGNOSIS — M6281 Muscle weakness (generalized): Secondary | ICD-10-CM | POA: Diagnosis not present

## 2017-01-02 DIAGNOSIS — G20 Parkinson's disease: Secondary | ICD-10-CM | POA: Diagnosis not present

## 2017-01-03 DIAGNOSIS — Z23 Encounter for immunization: Secondary | ICD-10-CM | POA: Diagnosis not present

## 2017-01-03 DIAGNOSIS — R278 Other lack of coordination: Secondary | ICD-10-CM | POA: Diagnosis not present

## 2017-01-03 DIAGNOSIS — G20 Parkinson's disease: Secondary | ICD-10-CM | POA: Diagnosis not present

## 2017-01-03 DIAGNOSIS — R262 Difficulty in walking, not elsewhere classified: Secondary | ICD-10-CM | POA: Diagnosis not present

## 2017-01-03 DIAGNOSIS — M6281 Muscle weakness (generalized): Secondary | ICD-10-CM | POA: Diagnosis not present

## 2017-01-04 DIAGNOSIS — R262 Difficulty in walking, not elsewhere classified: Secondary | ICD-10-CM | POA: Diagnosis not present

## 2017-01-04 DIAGNOSIS — M6281 Muscle weakness (generalized): Secondary | ICD-10-CM | POA: Diagnosis not present

## 2017-01-04 DIAGNOSIS — G20 Parkinson's disease: Secondary | ICD-10-CM | POA: Diagnosis not present

## 2017-01-04 DIAGNOSIS — R278 Other lack of coordination: Secondary | ICD-10-CM | POA: Diagnosis not present

## 2017-01-05 DIAGNOSIS — G20 Parkinson's disease: Secondary | ICD-10-CM | POA: Diagnosis not present

## 2017-01-05 DIAGNOSIS — M6281 Muscle weakness (generalized): Secondary | ICD-10-CM | POA: Diagnosis not present

## 2017-01-05 DIAGNOSIS — R262 Difficulty in walking, not elsewhere classified: Secondary | ICD-10-CM | POA: Diagnosis not present

## 2017-01-05 DIAGNOSIS — R278 Other lack of coordination: Secondary | ICD-10-CM | POA: Diagnosis not present

## 2017-01-06 DIAGNOSIS — R262 Difficulty in walking, not elsewhere classified: Secondary | ICD-10-CM | POA: Diagnosis not present

## 2017-01-06 DIAGNOSIS — G20 Parkinson's disease: Secondary | ICD-10-CM | POA: Diagnosis not present

## 2017-01-06 DIAGNOSIS — R278 Other lack of coordination: Secondary | ICD-10-CM | POA: Diagnosis not present

## 2017-01-06 DIAGNOSIS — M6281 Muscle weakness (generalized): Secondary | ICD-10-CM | POA: Diagnosis not present

## 2017-01-09 DIAGNOSIS — R262 Difficulty in walking, not elsewhere classified: Secondary | ICD-10-CM | POA: Diagnosis not present

## 2017-01-09 DIAGNOSIS — G20 Parkinson's disease: Secondary | ICD-10-CM | POA: Diagnosis not present

## 2017-01-09 DIAGNOSIS — R278 Other lack of coordination: Secondary | ICD-10-CM | POA: Diagnosis not present

## 2017-01-09 DIAGNOSIS — M6281 Muscle weakness (generalized): Secondary | ICD-10-CM | POA: Diagnosis not present

## 2017-01-10 DIAGNOSIS — G20 Parkinson's disease: Secondary | ICD-10-CM | POA: Diagnosis not present

## 2017-01-10 DIAGNOSIS — R262 Difficulty in walking, not elsewhere classified: Secondary | ICD-10-CM | POA: Diagnosis not present

## 2017-01-10 DIAGNOSIS — M6281 Muscle weakness (generalized): Secondary | ICD-10-CM | POA: Diagnosis not present

## 2017-01-10 DIAGNOSIS — R278 Other lack of coordination: Secondary | ICD-10-CM | POA: Diagnosis not present

## 2017-01-11 DIAGNOSIS — M6281 Muscle weakness (generalized): Secondary | ICD-10-CM | POA: Diagnosis not present

## 2017-01-11 DIAGNOSIS — R262 Difficulty in walking, not elsewhere classified: Secondary | ICD-10-CM | POA: Diagnosis not present

## 2017-01-11 DIAGNOSIS — G20 Parkinson's disease: Secondary | ICD-10-CM | POA: Diagnosis not present

## 2017-01-11 DIAGNOSIS — R278 Other lack of coordination: Secondary | ICD-10-CM | POA: Diagnosis not present

## 2017-01-12 ENCOUNTER — Ambulatory Visit: Payer: Medicare Other | Admitting: Neurology

## 2017-01-12 ENCOUNTER — Ambulatory Visit (INDEPENDENT_AMBULATORY_CARE_PROVIDER_SITE_OTHER): Payer: Medicare Other | Admitting: Neurology

## 2017-01-12 ENCOUNTER — Encounter: Payer: Self-pay | Admitting: Neurology

## 2017-01-12 VITALS — BP 144/71 | HR 88 | Ht 70.0 in | Wt 214.0 lb

## 2017-01-12 DIAGNOSIS — R278 Other lack of coordination: Secondary | ICD-10-CM | POA: Diagnosis not present

## 2017-01-12 DIAGNOSIS — I6300 Cerebral infarction due to thrombosis of unspecified precerebral artery: Secondary | ICD-10-CM

## 2017-01-12 DIAGNOSIS — I639 Cerebral infarction, unspecified: Secondary | ICD-10-CM

## 2017-01-12 DIAGNOSIS — R269 Unspecified abnormalities of gait and mobility: Secondary | ICD-10-CM | POA: Diagnosis not present

## 2017-01-12 DIAGNOSIS — G20 Parkinson's disease: Secondary | ICD-10-CM | POA: Diagnosis not present

## 2017-01-12 DIAGNOSIS — M6281 Muscle weakness (generalized): Secondary | ICD-10-CM | POA: Diagnosis not present

## 2017-01-12 DIAGNOSIS — R262 Difficulty in walking, not elsewhere classified: Secondary | ICD-10-CM | POA: Diagnosis not present

## 2017-01-12 MED ORDER — CARBIDOPA-LEVODOPA 25-100 MG PO TABS: 1.0000 | ORAL_TABLET | Freq: Three times a day (TID) | ORAL | 11 refills | Status: DC

## 2017-01-12 NOTE — Progress Notes (Signed)
PATIENT: Jason [name] DOB: 03-02-1928  Chief Complaint  Patient presents with  . Follow-up    parkinson's disease. pt had a fall on 10/19/16 which resulted in several broken ribs. pt is now at ALF at [facility]. pt is only taking sinemet BID at [name] and needs clarification in dosing of sinemet. Dr. [name] recommended TID dosing.  Marland Kitchen PCP    Dr. Melinda Crutch     HISTORICAL  Jason [name] is a 83 year old right-handed male, seen in refer by hospital to follow-up on stroke, his primary care physician is Dr. Zenia Resides loss, is accompanied by his daughter [name] at today's clinical visit, initial evaluation was on June fourth 2018.  I reviewed and summarized his most recent hospital discharge on July 07 2016,  He has past medical history of proximal atrial fibrillation, on Eliquis, Barrett's esophagus, bladder cancer, non-Hodgkin's lymphoma, was treated with chemo and radiation therapy in 2011, osteoarthritis, hyperlipidemia, he presented with acute stroke on March 29th 2018, right-sided facial droop, right upper and lower extremity weakness, I have personally reviewed MRI of the brain showed left paramedian pontine infarction secondary to small vessel disease, CT angiogram of head and neck showed no significant large vessel abnormality.  Pan CT negative for malignancy, LDL was 68, A1c was 5.5, UDS was negative, myasthenia gravis panel was negative, CMP showed potassium of 3.2, CBC showed hemoglobin of 11.3, normal B12, A1c was 5.5, LDL was 68, cholesterol 123, LDL 68, normal TSH, CPK was 125, negative HIV,  He was also admitted to the hospital earlier for community-acquired pneumonia, presented with generalized weakness on June 14 2016, fell next morning, was take to hospital on March 14 to 19, 2018,  during hospital admission, he had an event of unresponsiveness bradykinesia,he was noted to have mild aspiration   Echocardiogram ejection 55-60%, no regional wall motion abnormality  identified  He fell in 2017, was noted to have drooling eyelid, unsteady gait, began to use walker since then, his drooling, and mild swallowing difficulty has much improved with current speech physical therapy, is also receiving PT OT,   UPDATE Jan 12 2017: He was admitted to the hospital on October 28 2016 after fell at home, had left posterior 9,10, 11 rib fractures, and small left hemopneumothorax, T9 and T10 mildly displaced transverse process fracture, left T6 nondisplaced inferior facet fracture, bilateral lower lobe bronchopneumonia,  Laboratory evaluation showed hemoglobin of 10.9, BMP showed creatinine 1.11,  Now he is at [facility], he is now only Eliquis 5 mg twice a day, omeprazole 40 mg twice a day, tamsulosin 0.4 milligrams at bedtime , he is also taking his Sinemet and 25/100 mg twice a day at 8 AM, 8 PM,  He can walk around in his room with walker, eat at dinning room, needing help dressing and bathing, prior to [facility] living,, he was taking sinemet 25/100. 3 times a day, daughter reported that it did help his walking   REVIEW OF SYSTEMS: Full 14 as,  system review of systems performed and notable only for activity change, drooling, cough, depression, daytime sleepiness   ALLERGIES: No Known Allergies  HOME MEDICATIONS: Current Outpatient Prescriptions  Medication Sig Dispense Refill  . apixaban (ELIQUIS) 5 MG TABS tablet Take 5 mg by mouth 2 (two) times daily.    . benzonatate (TESSALON) 100 MG capsule Take 100 mg by mouth every 8 (eight) hours as needed for cough.  0  . carbidopa-levodopa (SINEMET IR) 25-100 MG tablet Take 1 tablet by mouth  3 (three) times daily. 90 tablet 6  . Docusate Sodium (COLACE PO) Take 100 mg by mouth at bedtime.     Marland Kitchen esomeprazole (NEXIUM) 40 MG capsule TAKE 1 CAPSULE (40 MG TOTAL) BY MOUTH 2 (TWO) TIMES DAILY BEFORE A MEAL. 60 capsule 6  . fish oil-omega-3 fatty acids 1000 MG capsule Take 1 g by mouth 2 (two) times daily.     . Melatonin 3 MG  TABS Take 3 mg by mouth at bedtime.    . Multiple Vitamins-Minerals (ICAPS MV PO) Take 1 capsule by mouth 2 (two) times daily.     Vladimir Faster Glycol-Propyl Glycol (SYSTANE) 0.4-0.3 % SOLN Place 1 drop into both eyes daily as needed (for dry eyes).     Marland Kitchen senna (SENOKOT) 8.6 MG tablet Take 1 tablet by mouth daily.    . simvastatin (ZOCOR) 40 MG tablet Take 40 mg by mouth at bedtime.      . tamsulosin (FLOMAX) 0.4 MG CAPS capsule Take 0.4 mg by mouth at bedtime.    . traMADol (ULTRAM) 50 MG tablet Take 1 tablet (50 mg total) by mouth every 6 (six) hours. 20 tablet 0   No current facility-administered medications for this visit.     PAST MEDICAL HISTORY: Past Medical History:  Diagnosis Date  . Arthritis   . Barrett esophagus   . Bladder cancer ([place]) dx'd 5573,2202   surg only  . Colon polyps    adenomatous  . Difficulty sleeping    OCCASIONALLY  . Diverticulosis of colon (without mention of hemorrhage)   . Frequency of urination   . GERD (gastroesophageal reflux disease)   . Hiatal hernia   . History of bladder cancer 1981   EXCISION ONLY  . History of skin cancer    BASAL CELL  . Hyperlipidemia   . Lymphoma, non-Hodgkin's ([place]) 2011   [place]   . Macular degeneration    LEFT EYE  . Multiple nodules of lung 09/16/2011  . PAF (paroxysmal atrial fibrillation) ([place])   . Personal history of colonic polyps 07/15/2010   tubular adenoma  . Prostate cancer [facility])    patient denies  . PUD (peptic ulcer disease)   . Stomach cancer ([place])    NHL origin  . Stroke ([place])   . Weakness     PAST SURGICAL HISTORY: Past Surgical History:  Procedure Laterality Date  . APPENDECTOMY    . bone spur     left shoulder  . CHOLECYSTECTOMY    . KNEE ARTHROPLASTY Left 07/30/2015   Procedure: LEFT TOTAL KNEE ARTHROPLASTY WITH COMPUTER NAVIGATION;  Surgeon: Rod Can, MD;  Location: [facility]S;  Service: Orthopedics;  Laterality: Left;  . LASER OF PROSTATE W/ GREEN LIGHT PVP    . TENDON  REPAIR     right arm  . TRIGGER FINGER RELEASE     bilateral hands  . WRIST SURGERY     Bilateral    FAMILY HISTORY: Family History  Problem Relation Age of Onset  . Heart attack Father   . Other Mother        "old age"    SOCIAL HISTORY:  Social History   Social History  . Marital status: Widowed    Spouse name: N/A  . Number of children: 2  . Years of education: HS   Occupational History  . retired   .  Retired   Social History Main Topics  . Smoking status: Former Smoker    Quit date: 10/29/1966  .  Smokeless tobacco: Former Systems developer    Quit date: 06/29/1998  . Alcohol use Yes     Comment: Socially  . Drug use: No  . Sexual activity: Not on file   Other Topics Concern  . Not on file   Social History Narrative   Lives at home alone.   Right-handed.   Occasional use of caffeine.     PHYSICAL EXAM   Vitals:   01/12/17 1050  BP: (!) 144/71  Pulse: 88  Weight: 214 lb (97.1 kg)  Height: 5\' 10"  (1.778 m)    Not recorded      Body mass index is 30.71 kg/m.  PHYSICAL EXAMNIATION:  Gen: NAD, conversant, well nourised, obese, well groomed                     Cardiovascular: Regular rate rhythm, no peripheral edema, warm, nontender. Eyes: Conjunctivae clear without exudates or hemorrhage Neck: Supple, no carotid bruits. Pulmonary: Clear to auscultation bilaterally   NEUROLOGICAL EXAM:  MENTAL STATUS: Speech:   Mild slurring, wet speech, fluent and spontaneous with normal comprehension.  Cognition:     Orientation to time, place and person     Normal recent and remote memory     Normal Attention span and concentration     Normal Language, naming, repeating,spontaneous speech     Fund of knowledge   CRANIAL NERVES: CN II: Visual fields are full to confrontation.  Pupils are round equal and briskly reactive to light. CN III, IV, VI: extraocular movement are normal. No ptosis. CN V: Facial sensation is intact to pinprick in all 3 divisions  bilaterally. Corneal responses are intact.  CN VII: Face is symmetric with normal eye closure and smile. CN VIII: Hearing is normal to rubbing fingers CN IX, X: Palate elevates symmetrically. Phonation is normal. CN XI: Head turning and shoulder shrug are intact CN XII: Tongue is midline with normal movements and no atrophy.  MOTOR: He has slight right hemiparesis, fixation of right arm on rapid rotating movement, he was also noted to have mild to moderate rigidity bradykinesia, left worse than right, increased with reinforcement maneuver, also had mild nuchal rigidity  REFLEXES: Reflexes are 2+ and symmetric at the biceps, triceps, knees, and ankles. Plantar responses are flexor.  SENSORY: Intact to light touch, pinprick, positional sensation and vibratory sensation are intact in fingers and toes.  COORDINATION: Rapid alternating movements and fine finger movements are intact. There is no dysmetria on finger-to-nose and heel-knee-shin.    GAIT/STANCE: He needs push up to get up from seated position, dragging his feet, cautious,   DIAGNOSTIC DATA (LABS, IMAGING, TESTING) - I reviewed patient records, labs, notes, testing and imaging myself where available.   ASSESSMENT AND PLAN  MAYFIELD SCHOENE is a 83 y.o. male    Mild parkinsonian syndrome, gait abnormality   keep sinemet 25/100 mg 3 times a day, which did help his walking.  Now he is at [facility] living now.  Acute Involving left median pontine on June 30 2016  Continue to address his vascular risk factor, which include hypertension, hyperlipidemia, paroxysmal atrial fibrillation,  Continue Eliquis 5 mg twice a day  Mild dysphagia, excessive drooling  Myasthenia gravis panel to rule out neuromuscular junctional disorder  Marcial Pacas, M.D. Ph.D.  [facility] [address], [place], [place] [address] Ph: [phone] Fax: [phone]  CC: Referring Provider

## 2017-01-16 DIAGNOSIS — G20 Parkinson's disease: Secondary | ICD-10-CM | POA: Diagnosis not present

## 2017-01-16 DIAGNOSIS — R278 Other lack of coordination: Secondary | ICD-10-CM | POA: Diagnosis not present

## 2017-01-16 DIAGNOSIS — M6281 Muscle weakness (generalized): Secondary | ICD-10-CM | POA: Diagnosis not present

## 2017-01-16 DIAGNOSIS — R262 Difficulty in walking, not elsewhere classified: Secondary | ICD-10-CM | POA: Diagnosis not present

## 2017-01-17 ENCOUNTER — Telehealth: Payer: Self-pay | Admitting: *Deleted

## 2017-01-17 LAB — ACETYLCHOLINE RECEPTOR AB, ALL
ACETYLCHOL BLOCK AB: 19 % (ref 0–25)
AChR Binding Ab, Serum: <0.03 nmol/L (ref 0.00–0.24)

## 2017-01-17 NOTE — Telephone Encounter (Signed)
-----   Message from Marcial Pacas, MD sent at 01/17/2017  3:55 PM EDT ----- Please call patient for normal laboratory result

## 2017-01-17 NOTE — Telephone Encounter (Signed)
Left message, on his Jason [name] voicemail (dgt on HIPAA), informing of normal lab results.  Provided our number to call back with any questions.

## 2017-01-18 DIAGNOSIS — R278 Other lack of coordination: Secondary | ICD-10-CM | POA: Diagnosis not present

## 2017-01-18 DIAGNOSIS — R262 Difficulty in walking, not elsewhere classified: Secondary | ICD-10-CM | POA: Diagnosis not present

## 2017-01-18 DIAGNOSIS — M6281 Muscle weakness (generalized): Secondary | ICD-10-CM | POA: Diagnosis not present

## 2017-01-18 DIAGNOSIS — G20 Parkinson's disease: Secondary | ICD-10-CM | POA: Diagnosis not present

## 2017-01-19 DIAGNOSIS — M6281 Muscle weakness (generalized): Secondary | ICD-10-CM | POA: Diagnosis not present

## 2017-01-19 DIAGNOSIS — G20 Parkinson's disease: Secondary | ICD-10-CM | POA: Diagnosis not present

## 2017-01-19 DIAGNOSIS — R278 Other lack of coordination: Secondary | ICD-10-CM | POA: Diagnosis not present

## 2017-01-19 DIAGNOSIS — R262 Difficulty in walking, not elsewhere classified: Secondary | ICD-10-CM | POA: Diagnosis not present

## 2017-01-20 DIAGNOSIS — R1314 Dysphagia, pharyngoesophageal phase: Secondary | ICD-10-CM | POA: Diagnosis not present

## 2017-01-20 DIAGNOSIS — J069 Acute upper respiratory infection, unspecified: Secondary | ICD-10-CM | POA: Diagnosis not present

## 2017-01-23 DIAGNOSIS — G20 Parkinson's disease: Secondary | ICD-10-CM | POA: Diagnosis not present

## 2017-01-23 DIAGNOSIS — R262 Difficulty in walking, not elsewhere classified: Secondary | ICD-10-CM | POA: Diagnosis not present

## 2017-01-23 DIAGNOSIS — M6281 Muscle weakness (generalized): Secondary | ICD-10-CM | POA: Diagnosis not present

## 2017-01-23 DIAGNOSIS — R278 Other lack of coordination: Secondary | ICD-10-CM | POA: Diagnosis not present

## 2017-01-24 DIAGNOSIS — G20 Parkinson's disease: Secondary | ICD-10-CM | POA: Diagnosis not present

## 2017-01-24 DIAGNOSIS — R1312 Dysphagia, oropharyngeal phase: Secondary | ICD-10-CM | POA: Diagnosis not present

## 2017-01-24 DIAGNOSIS — M6281 Muscle weakness (generalized): Secondary | ICD-10-CM | POA: Diagnosis not present

## 2017-01-24 DIAGNOSIS — R262 Difficulty in walking, not elsewhere classified: Secondary | ICD-10-CM | POA: Diagnosis not present

## 2017-01-24 DIAGNOSIS — I69328 Other speech and language deficits following cerebral infarction: Secondary | ICD-10-CM | POA: Diagnosis not present

## 2017-01-24 DIAGNOSIS — R41841 Cognitive communication deficit: Secondary | ICD-10-CM | POA: Diagnosis not present

## 2017-01-24 DIAGNOSIS — R278 Other lack of coordination: Secondary | ICD-10-CM | POA: Diagnosis not present

## 2017-01-24 DIAGNOSIS — H542X11 Low vision right eye category 1, low vision left eye category 1: Secondary | ICD-10-CM | POA: Diagnosis not present

## 2017-01-25 DIAGNOSIS — R1312 Dysphagia, oropharyngeal phase: Secondary | ICD-10-CM | POA: Diagnosis not present

## 2017-01-25 DIAGNOSIS — H542X11 Low vision right eye category 1, low vision left eye category 1: Secondary | ICD-10-CM | POA: Diagnosis not present

## 2017-01-25 DIAGNOSIS — R278 Other lack of coordination: Secondary | ICD-10-CM | POA: Diagnosis not present

## 2017-01-25 DIAGNOSIS — R262 Difficulty in walking, not elsewhere classified: Secondary | ICD-10-CM | POA: Diagnosis not present

## 2017-01-25 DIAGNOSIS — M6281 Muscle weakness (generalized): Secondary | ICD-10-CM | POA: Diagnosis not present

## 2017-01-25 DIAGNOSIS — R41841 Cognitive communication deficit: Secondary | ICD-10-CM | POA: Diagnosis not present

## 2017-01-26 DIAGNOSIS — R1312 Dysphagia, oropharyngeal phase: Secondary | ICD-10-CM | POA: Diagnosis not present

## 2017-01-26 DIAGNOSIS — R278 Other lack of coordination: Secondary | ICD-10-CM | POA: Diagnosis not present

## 2017-01-26 DIAGNOSIS — H542X11 Low vision right eye category 1, low vision left eye category 1: Secondary | ICD-10-CM | POA: Diagnosis not present

## 2017-01-26 DIAGNOSIS — R41841 Cognitive communication deficit: Secondary | ICD-10-CM | POA: Diagnosis not present

## 2017-01-26 DIAGNOSIS — M6281 Muscle weakness (generalized): Secondary | ICD-10-CM | POA: Diagnosis not present

## 2017-01-26 DIAGNOSIS — R262 Difficulty in walking, not elsewhere classified: Secondary | ICD-10-CM | POA: Diagnosis not present

## 2017-01-27 DIAGNOSIS — R41841 Cognitive communication deficit: Secondary | ICD-10-CM | POA: Diagnosis not present

## 2017-01-27 DIAGNOSIS — R262 Difficulty in walking, not elsewhere classified: Secondary | ICD-10-CM | POA: Diagnosis not present

## 2017-01-27 DIAGNOSIS — M6281 Muscle weakness (generalized): Secondary | ICD-10-CM | POA: Diagnosis not present

## 2017-01-27 DIAGNOSIS — H542X11 Low vision right eye category 1, low vision left eye category 1: Secondary | ICD-10-CM | POA: Diagnosis not present

## 2017-01-27 DIAGNOSIS — R278 Other lack of coordination: Secondary | ICD-10-CM | POA: Diagnosis not present

## 2017-01-27 DIAGNOSIS — R1312 Dysphagia, oropharyngeal phase: Secondary | ICD-10-CM | POA: Diagnosis not present

## 2017-01-30 DIAGNOSIS — R278 Other lack of coordination: Secondary | ICD-10-CM | POA: Diagnosis not present

## 2017-01-30 DIAGNOSIS — R41841 Cognitive communication deficit: Secondary | ICD-10-CM | POA: Diagnosis not present

## 2017-01-30 DIAGNOSIS — R262 Difficulty in walking, not elsewhere classified: Secondary | ICD-10-CM | POA: Diagnosis not present

## 2017-01-30 DIAGNOSIS — R1312 Dysphagia, oropharyngeal phase: Secondary | ICD-10-CM | POA: Diagnosis not present

## 2017-01-30 DIAGNOSIS — H542X11 Low vision right eye category 1, low vision left eye category 1: Secondary | ICD-10-CM | POA: Diagnosis not present

## 2017-01-30 DIAGNOSIS — M6281 Muscle weakness (generalized): Secondary | ICD-10-CM | POA: Diagnosis not present

## 2017-01-31 DIAGNOSIS — R278 Other lack of coordination: Secondary | ICD-10-CM | POA: Diagnosis not present

## 2017-01-31 DIAGNOSIS — H542X11 Low vision right eye category 1, low vision left eye category 1: Secondary | ICD-10-CM | POA: Diagnosis not present

## 2017-01-31 DIAGNOSIS — R1312 Dysphagia, oropharyngeal phase: Secondary | ICD-10-CM | POA: Diagnosis not present

## 2017-01-31 DIAGNOSIS — M6281 Muscle weakness (generalized): Secondary | ICD-10-CM | POA: Diagnosis not present

## 2017-01-31 DIAGNOSIS — R41841 Cognitive communication deficit: Secondary | ICD-10-CM | POA: Diagnosis not present

## 2017-01-31 DIAGNOSIS — R262 Difficulty in walking, not elsewhere classified: Secondary | ICD-10-CM | POA: Diagnosis not present

## 2017-02-01 DIAGNOSIS — R41841 Cognitive communication deficit: Secondary | ICD-10-CM | POA: Diagnosis not present

## 2017-02-01 DIAGNOSIS — H542X11 Low vision right eye category 1, low vision left eye category 1: Secondary | ICD-10-CM | POA: Diagnosis not present

## 2017-02-01 DIAGNOSIS — M6281 Muscle weakness (generalized): Secondary | ICD-10-CM | POA: Diagnosis not present

## 2017-02-01 DIAGNOSIS — R1312 Dysphagia, oropharyngeal phase: Secondary | ICD-10-CM | POA: Diagnosis not present

## 2017-02-01 DIAGNOSIS — R278 Other lack of coordination: Secondary | ICD-10-CM | POA: Diagnosis not present

## 2017-02-01 DIAGNOSIS — R262 Difficulty in walking, not elsewhere classified: Secondary | ICD-10-CM | POA: Diagnosis not present

## 2017-02-02 DIAGNOSIS — R278 Other lack of coordination: Secondary | ICD-10-CM | POA: Diagnosis not present

## 2017-02-02 DIAGNOSIS — G20 Parkinson's disease: Secondary | ICD-10-CM | POA: Diagnosis not present

## 2017-02-02 DIAGNOSIS — R41841 Cognitive communication deficit: Secondary | ICD-10-CM | POA: Diagnosis not present

## 2017-02-02 DIAGNOSIS — I69328 Other speech and language deficits following cerebral infarction: Secondary | ICD-10-CM | POA: Diagnosis not present

## 2017-02-02 DIAGNOSIS — R262 Difficulty in walking, not elsewhere classified: Secondary | ICD-10-CM | POA: Diagnosis not present

## 2017-02-02 DIAGNOSIS — M6281 Muscle weakness (generalized): Secondary | ICD-10-CM | POA: Diagnosis not present

## 2017-02-02 DIAGNOSIS — R1312 Dysphagia, oropharyngeal phase: Secondary | ICD-10-CM | POA: Diagnosis not present

## 2017-02-02 DIAGNOSIS — H542X11 Low vision right eye category 1, low vision left eye category 1: Secondary | ICD-10-CM | POA: Diagnosis not present

## 2017-02-03 DIAGNOSIS — R1312 Dysphagia, oropharyngeal phase: Secondary | ICD-10-CM | POA: Diagnosis not present

## 2017-02-03 DIAGNOSIS — H542X11 Low vision right eye category 1, low vision left eye category 1: Secondary | ICD-10-CM | POA: Diagnosis not present

## 2017-02-03 DIAGNOSIS — M6281 Muscle weakness (generalized): Secondary | ICD-10-CM | POA: Diagnosis not present

## 2017-02-03 DIAGNOSIS — R262 Difficulty in walking, not elsewhere classified: Secondary | ICD-10-CM | POA: Diagnosis not present

## 2017-02-03 DIAGNOSIS — R278 Other lack of coordination: Secondary | ICD-10-CM | POA: Diagnosis not present

## 2017-02-03 DIAGNOSIS — R41841 Cognitive communication deficit: Secondary | ICD-10-CM | POA: Diagnosis not present

## 2017-02-06 DIAGNOSIS — H542X11 Low vision right eye category 1, low vision left eye category 1: Secondary | ICD-10-CM | POA: Diagnosis not present

## 2017-02-06 DIAGNOSIS — M6281 Muscle weakness (generalized): Secondary | ICD-10-CM | POA: Diagnosis not present

## 2017-02-06 DIAGNOSIS — R1312 Dysphagia, oropharyngeal phase: Secondary | ICD-10-CM | POA: Diagnosis not present

## 2017-02-06 DIAGNOSIS — R278 Other lack of coordination: Secondary | ICD-10-CM | POA: Diagnosis not present

## 2017-02-06 DIAGNOSIS — R262 Difficulty in walking, not elsewhere classified: Secondary | ICD-10-CM | POA: Diagnosis not present

## 2017-02-06 DIAGNOSIS — R41841 Cognitive communication deficit: Secondary | ICD-10-CM | POA: Diagnosis not present

## 2017-02-07 DIAGNOSIS — R1312 Dysphagia, oropharyngeal phase: Secondary | ICD-10-CM | POA: Diagnosis not present

## 2017-02-07 DIAGNOSIS — R278 Other lack of coordination: Secondary | ICD-10-CM | POA: Diagnosis not present

## 2017-02-07 DIAGNOSIS — R41841 Cognitive communication deficit: Secondary | ICD-10-CM | POA: Diagnosis not present

## 2017-02-07 DIAGNOSIS — M6281 Muscle weakness (generalized): Secondary | ICD-10-CM | POA: Diagnosis not present

## 2017-02-07 DIAGNOSIS — R262 Difficulty in walking, not elsewhere classified: Secondary | ICD-10-CM | POA: Diagnosis not present

## 2017-02-07 DIAGNOSIS — H542X11 Low vision right eye category 1, low vision left eye category 1: Secondary | ICD-10-CM | POA: Diagnosis not present

## 2017-02-08 DIAGNOSIS — H542X11 Low vision right eye category 1, low vision left eye category 1: Secondary | ICD-10-CM | POA: Diagnosis not present

## 2017-02-08 DIAGNOSIS — R262 Difficulty in walking, not elsewhere classified: Secondary | ICD-10-CM | POA: Diagnosis not present

## 2017-02-08 DIAGNOSIS — R278 Other lack of coordination: Secondary | ICD-10-CM | POA: Diagnosis not present

## 2017-02-08 DIAGNOSIS — R41841 Cognitive communication deficit: Secondary | ICD-10-CM | POA: Diagnosis not present

## 2017-02-08 DIAGNOSIS — R1312 Dysphagia, oropharyngeal phase: Secondary | ICD-10-CM | POA: Diagnosis not present

## 2017-02-08 DIAGNOSIS — M6281 Muscle weakness (generalized): Secondary | ICD-10-CM | POA: Diagnosis not present

## 2017-02-09 DIAGNOSIS — M6281 Muscle weakness (generalized): Secondary | ICD-10-CM | POA: Diagnosis not present

## 2017-02-09 DIAGNOSIS — R41841 Cognitive communication deficit: Secondary | ICD-10-CM | POA: Diagnosis not present

## 2017-02-09 DIAGNOSIS — H542X11 Low vision right eye category 1, low vision left eye category 1: Secondary | ICD-10-CM | POA: Diagnosis not present

## 2017-02-09 DIAGNOSIS — R1312 Dysphagia, oropharyngeal phase: Secondary | ICD-10-CM | POA: Diagnosis not present

## 2017-02-09 DIAGNOSIS — R262 Difficulty in walking, not elsewhere classified: Secondary | ICD-10-CM | POA: Diagnosis not present

## 2017-02-09 DIAGNOSIS — R278 Other lack of coordination: Secondary | ICD-10-CM | POA: Diagnosis not present

## 2017-02-09 DIAGNOSIS — H353221 Exudative age-related macular degeneration, left eye, with active choroidal neovascularization: Secondary | ICD-10-CM | POA: Diagnosis not present

## 2017-02-13 DIAGNOSIS — R41841 Cognitive communication deficit: Secondary | ICD-10-CM | POA: Diagnosis not present

## 2017-02-13 DIAGNOSIS — R262 Difficulty in walking, not elsewhere classified: Secondary | ICD-10-CM | POA: Diagnosis not present

## 2017-02-13 DIAGNOSIS — H542X11 Low vision right eye category 1, low vision left eye category 1: Secondary | ICD-10-CM | POA: Diagnosis not present

## 2017-02-13 DIAGNOSIS — R1312 Dysphagia, oropharyngeal phase: Secondary | ICD-10-CM | POA: Diagnosis not present

## 2017-02-13 DIAGNOSIS — M6281 Muscle weakness (generalized): Secondary | ICD-10-CM | POA: Diagnosis not present

## 2017-02-13 DIAGNOSIS — R278 Other lack of coordination: Secondary | ICD-10-CM | POA: Diagnosis not present

## 2017-02-14 DIAGNOSIS — R1312 Dysphagia, oropharyngeal phase: Secondary | ICD-10-CM | POA: Diagnosis not present

## 2017-02-14 DIAGNOSIS — R278 Other lack of coordination: Secondary | ICD-10-CM | POA: Diagnosis not present

## 2017-02-14 DIAGNOSIS — H542X11 Low vision right eye category 1, low vision left eye category 1: Secondary | ICD-10-CM | POA: Diagnosis not present

## 2017-02-14 DIAGNOSIS — M6281 Muscle weakness (generalized): Secondary | ICD-10-CM | POA: Diagnosis not present

## 2017-02-14 DIAGNOSIS — R262 Difficulty in walking, not elsewhere classified: Secondary | ICD-10-CM | POA: Diagnosis not present

## 2017-02-14 DIAGNOSIS — R41841 Cognitive communication deficit: Secondary | ICD-10-CM | POA: Diagnosis not present

## 2017-02-15 DIAGNOSIS — R41841 Cognitive communication deficit: Secondary | ICD-10-CM | POA: Diagnosis not present

## 2017-02-15 DIAGNOSIS — R1312 Dysphagia, oropharyngeal phase: Secondary | ICD-10-CM | POA: Diagnosis not present

## 2017-02-15 DIAGNOSIS — M6281 Muscle weakness (generalized): Secondary | ICD-10-CM | POA: Diagnosis not present

## 2017-02-15 DIAGNOSIS — R262 Difficulty in walking, not elsewhere classified: Secondary | ICD-10-CM | POA: Diagnosis not present

## 2017-02-15 DIAGNOSIS — R278 Other lack of coordination: Secondary | ICD-10-CM | POA: Diagnosis not present

## 2017-02-15 DIAGNOSIS — H542X11 Low vision right eye category 1, low vision left eye category 1: Secondary | ICD-10-CM | POA: Diagnosis not present

## 2017-02-16 DIAGNOSIS — R1312 Dysphagia, oropharyngeal phase: Secondary | ICD-10-CM | POA: Diagnosis not present

## 2017-02-16 DIAGNOSIS — R262 Difficulty in walking, not elsewhere classified: Secondary | ICD-10-CM | POA: Diagnosis not present

## 2017-02-16 DIAGNOSIS — H542X11 Low vision right eye category 1, low vision left eye category 1: Secondary | ICD-10-CM | POA: Diagnosis not present

## 2017-02-16 DIAGNOSIS — M6281 Muscle weakness (generalized): Secondary | ICD-10-CM | POA: Diagnosis not present

## 2017-02-16 DIAGNOSIS — R278 Other lack of coordination: Secondary | ICD-10-CM | POA: Diagnosis not present

## 2017-02-16 DIAGNOSIS — R41841 Cognitive communication deficit: Secondary | ICD-10-CM | POA: Diagnosis not present

## 2017-02-20 DIAGNOSIS — M6281 Muscle weakness (generalized): Secondary | ICD-10-CM | POA: Diagnosis not present

## 2017-02-20 DIAGNOSIS — H542X11 Low vision right eye category 1, low vision left eye category 1: Secondary | ICD-10-CM | POA: Diagnosis not present

## 2017-02-20 DIAGNOSIS — R278 Other lack of coordination: Secondary | ICD-10-CM | POA: Diagnosis not present

## 2017-02-20 DIAGNOSIS — R41841 Cognitive communication deficit: Secondary | ICD-10-CM | POA: Diagnosis not present

## 2017-02-20 DIAGNOSIS — R262 Difficulty in walking, not elsewhere classified: Secondary | ICD-10-CM | POA: Diagnosis not present

## 2017-02-20 DIAGNOSIS — R1312 Dysphagia, oropharyngeal phase: Secondary | ICD-10-CM | POA: Diagnosis not present

## 2017-02-21 DIAGNOSIS — R278 Other lack of coordination: Secondary | ICD-10-CM | POA: Diagnosis not present

## 2017-02-21 DIAGNOSIS — R262 Difficulty in walking, not elsewhere classified: Secondary | ICD-10-CM | POA: Diagnosis not present

## 2017-02-21 DIAGNOSIS — M6281 Muscle weakness (generalized): Secondary | ICD-10-CM | POA: Diagnosis not present

## 2017-02-21 DIAGNOSIS — R41841 Cognitive communication deficit: Secondary | ICD-10-CM | POA: Diagnosis not present

## 2017-02-21 DIAGNOSIS — R1312 Dysphagia, oropharyngeal phase: Secondary | ICD-10-CM | POA: Diagnosis not present

## 2017-02-21 DIAGNOSIS — H542X11 Low vision right eye category 1, low vision left eye category 1: Secondary | ICD-10-CM | POA: Diagnosis not present

## 2017-02-22 DIAGNOSIS — R1312 Dysphagia, oropharyngeal phase: Secondary | ICD-10-CM | POA: Diagnosis not present

## 2017-02-22 DIAGNOSIS — R41841 Cognitive communication deficit: Secondary | ICD-10-CM | POA: Diagnosis not present

## 2017-02-22 DIAGNOSIS — M6281 Muscle weakness (generalized): Secondary | ICD-10-CM | POA: Diagnosis not present

## 2017-02-22 DIAGNOSIS — R278 Other lack of coordination: Secondary | ICD-10-CM | POA: Diagnosis not present

## 2017-02-22 DIAGNOSIS — R262 Difficulty in walking, not elsewhere classified: Secondary | ICD-10-CM | POA: Diagnosis not present

## 2017-02-22 DIAGNOSIS — H542X11 Low vision right eye category 1, low vision left eye category 1: Secondary | ICD-10-CM | POA: Diagnosis not present

## 2017-02-27 DIAGNOSIS — R41841 Cognitive communication deficit: Secondary | ICD-10-CM | POA: Diagnosis not present

## 2017-02-27 DIAGNOSIS — R262 Difficulty in walking, not elsewhere classified: Secondary | ICD-10-CM | POA: Diagnosis not present

## 2017-02-27 DIAGNOSIS — M6281 Muscle weakness (generalized): Secondary | ICD-10-CM | POA: Diagnosis not present

## 2017-02-27 DIAGNOSIS — R278 Other lack of coordination: Secondary | ICD-10-CM | POA: Diagnosis not present

## 2017-02-27 DIAGNOSIS — R1312 Dysphagia, oropharyngeal phase: Secondary | ICD-10-CM | POA: Diagnosis not present

## 2017-02-27 DIAGNOSIS — H542X11 Low vision right eye category 1, low vision left eye category 1: Secondary | ICD-10-CM | POA: Diagnosis not present

## 2017-02-28 DIAGNOSIS — R278 Other lack of coordination: Secondary | ICD-10-CM | POA: Diagnosis not present

## 2017-02-28 DIAGNOSIS — R1312 Dysphagia, oropharyngeal phase: Secondary | ICD-10-CM | POA: Diagnosis not present

## 2017-02-28 DIAGNOSIS — M6281 Muscle weakness (generalized): Secondary | ICD-10-CM | POA: Diagnosis not present

## 2017-02-28 DIAGNOSIS — R262 Difficulty in walking, not elsewhere classified: Secondary | ICD-10-CM | POA: Diagnosis not present

## 2017-02-28 DIAGNOSIS — R41841 Cognitive communication deficit: Secondary | ICD-10-CM | POA: Diagnosis not present

## 2017-02-28 DIAGNOSIS — H542X11 Low vision right eye category 1, low vision left eye category 1: Secondary | ICD-10-CM | POA: Diagnosis not present

## 2017-03-01 DIAGNOSIS — M6281 Muscle weakness (generalized): Secondary | ICD-10-CM | POA: Diagnosis not present

## 2017-03-01 DIAGNOSIS — R278 Other lack of coordination: Secondary | ICD-10-CM | POA: Diagnosis not present

## 2017-03-01 DIAGNOSIS — R262 Difficulty in walking, not elsewhere classified: Secondary | ICD-10-CM | POA: Diagnosis not present

## 2017-03-01 DIAGNOSIS — H542X11 Low vision right eye category 1, low vision left eye category 1: Secondary | ICD-10-CM | POA: Diagnosis not present

## 2017-03-01 DIAGNOSIS — R41841 Cognitive communication deficit: Secondary | ICD-10-CM | POA: Diagnosis not present

## 2017-03-01 DIAGNOSIS — R1312 Dysphagia, oropharyngeal phase: Secondary | ICD-10-CM | POA: Diagnosis not present

## 2017-03-02 DIAGNOSIS — R1312 Dysphagia, oropharyngeal phase: Secondary | ICD-10-CM | POA: Diagnosis not present

## 2017-03-02 DIAGNOSIS — M6281 Muscle weakness (generalized): Secondary | ICD-10-CM | POA: Diagnosis not present

## 2017-03-02 DIAGNOSIS — R278 Other lack of coordination: Secondary | ICD-10-CM | POA: Diagnosis not present

## 2017-03-02 DIAGNOSIS — H542X11 Low vision right eye category 1, low vision left eye category 1: Secondary | ICD-10-CM | POA: Diagnosis not present

## 2017-03-02 DIAGNOSIS — R262 Difficulty in walking, not elsewhere classified: Secondary | ICD-10-CM | POA: Diagnosis not present

## 2017-03-02 DIAGNOSIS — R41841 Cognitive communication deficit: Secondary | ICD-10-CM | POA: Diagnosis not present

## 2017-03-06 DIAGNOSIS — R278 Other lack of coordination: Secondary | ICD-10-CM | POA: Diagnosis not present

## 2017-03-06 DIAGNOSIS — R41841 Cognitive communication deficit: Secondary | ICD-10-CM | POA: Diagnosis not present

## 2017-03-06 DIAGNOSIS — I69328 Other speech and language deficits following cerebral infarction: Secondary | ICD-10-CM | POA: Diagnosis not present

## 2017-03-06 DIAGNOSIS — R1312 Dysphagia, oropharyngeal phase: Secondary | ICD-10-CM | POA: Diagnosis not present

## 2017-03-06 DIAGNOSIS — G20 Parkinson's disease: Secondary | ICD-10-CM | POA: Diagnosis not present

## 2017-03-06 DIAGNOSIS — H542X11 Low vision right eye category 1, low vision left eye category 1: Secondary | ICD-10-CM | POA: Diagnosis not present

## 2017-03-06 DIAGNOSIS — M6281 Muscle weakness (generalized): Secondary | ICD-10-CM | POA: Diagnosis not present

## 2017-03-07 DIAGNOSIS — R1312 Dysphagia, oropharyngeal phase: Secondary | ICD-10-CM | POA: Diagnosis not present

## 2017-03-07 DIAGNOSIS — R41841 Cognitive communication deficit: Secondary | ICD-10-CM | POA: Diagnosis not present

## 2017-03-07 DIAGNOSIS — R278 Other lack of coordination: Secondary | ICD-10-CM | POA: Diagnosis not present

## 2017-03-07 DIAGNOSIS — M6281 Muscle weakness (generalized): Secondary | ICD-10-CM | POA: Diagnosis not present

## 2017-03-07 DIAGNOSIS — H542X11 Low vision right eye category 1, low vision left eye category 1: Secondary | ICD-10-CM | POA: Diagnosis not present

## 2017-03-07 DIAGNOSIS — G20 Parkinson's disease: Secondary | ICD-10-CM | POA: Diagnosis not present

## 2017-03-08 DIAGNOSIS — R41841 Cognitive communication deficit: Secondary | ICD-10-CM | POA: Diagnosis not present

## 2017-03-08 DIAGNOSIS — R1312 Dysphagia, oropharyngeal phase: Secondary | ICD-10-CM | POA: Diagnosis not present

## 2017-03-08 DIAGNOSIS — G20 Parkinson's disease: Secondary | ICD-10-CM | POA: Diagnosis not present

## 2017-03-08 DIAGNOSIS — R278 Other lack of coordination: Secondary | ICD-10-CM | POA: Diagnosis not present

## 2017-03-08 DIAGNOSIS — M6281 Muscle weakness (generalized): Secondary | ICD-10-CM | POA: Diagnosis not present

## 2017-03-08 DIAGNOSIS — H542X11 Low vision right eye category 1, low vision left eye category 1: Secondary | ICD-10-CM | POA: Diagnosis not present

## 2017-03-09 DIAGNOSIS — M6281 Muscle weakness (generalized): Secondary | ICD-10-CM | POA: Diagnosis not present

## 2017-03-09 DIAGNOSIS — H542X11 Low vision right eye category 1, low vision left eye category 1: Secondary | ICD-10-CM | POA: Diagnosis not present

## 2017-03-09 DIAGNOSIS — R278 Other lack of coordination: Secondary | ICD-10-CM | POA: Diagnosis not present

## 2017-03-09 DIAGNOSIS — G20 Parkinson's disease: Secondary | ICD-10-CM | POA: Diagnosis not present

## 2017-03-09 DIAGNOSIS — R1312 Dysphagia, oropharyngeal phase: Secondary | ICD-10-CM | POA: Diagnosis not present

## 2017-03-09 DIAGNOSIS — R41841 Cognitive communication deficit: Secondary | ICD-10-CM | POA: Diagnosis not present

## 2017-03-14 DIAGNOSIS — R41841 Cognitive communication deficit: Secondary | ICD-10-CM | POA: Diagnosis not present

## 2017-03-14 DIAGNOSIS — R1312 Dysphagia, oropharyngeal phase: Secondary | ICD-10-CM | POA: Diagnosis not present

## 2017-03-14 DIAGNOSIS — M6281 Muscle weakness (generalized): Secondary | ICD-10-CM | POA: Diagnosis not present

## 2017-03-14 DIAGNOSIS — G20 Parkinson's disease: Secondary | ICD-10-CM | POA: Diagnosis not present

## 2017-03-14 DIAGNOSIS — H542X11 Low vision right eye category 1, low vision left eye category 1: Secondary | ICD-10-CM | POA: Diagnosis not present

## 2017-03-14 DIAGNOSIS — R278 Other lack of coordination: Secondary | ICD-10-CM | POA: Diagnosis not present

## 2017-03-15 DIAGNOSIS — G20 Parkinson's disease: Secondary | ICD-10-CM | POA: Diagnosis not present

## 2017-03-15 DIAGNOSIS — M6281 Muscle weakness (generalized): Secondary | ICD-10-CM | POA: Diagnosis not present

## 2017-03-15 DIAGNOSIS — H542X11 Low vision right eye category 1, low vision left eye category 1: Secondary | ICD-10-CM | POA: Diagnosis not present

## 2017-03-15 DIAGNOSIS — R41841 Cognitive communication deficit: Secondary | ICD-10-CM | POA: Diagnosis not present

## 2017-03-15 DIAGNOSIS — R278 Other lack of coordination: Secondary | ICD-10-CM | POA: Diagnosis not present

## 2017-03-15 DIAGNOSIS — R1312 Dysphagia, oropharyngeal phase: Secondary | ICD-10-CM | POA: Diagnosis not present

## 2017-03-16 DIAGNOSIS — H542X11 Low vision right eye category 1, low vision left eye category 1: Secondary | ICD-10-CM | POA: Diagnosis not present

## 2017-03-16 DIAGNOSIS — R1312 Dysphagia, oropharyngeal phase: Secondary | ICD-10-CM | POA: Diagnosis not present

## 2017-03-16 DIAGNOSIS — M6281 Muscle weakness (generalized): Secondary | ICD-10-CM | POA: Diagnosis not present

## 2017-03-16 DIAGNOSIS — G20 Parkinson's disease: Secondary | ICD-10-CM | POA: Diagnosis not present

## 2017-03-16 DIAGNOSIS — R278 Other lack of coordination: Secondary | ICD-10-CM | POA: Diagnosis not present

## 2017-03-16 DIAGNOSIS — R41841 Cognitive communication deficit: Secondary | ICD-10-CM | POA: Diagnosis not present

## 2017-03-17 DIAGNOSIS — R278 Other lack of coordination: Secondary | ICD-10-CM | POA: Diagnosis not present

## 2017-03-17 DIAGNOSIS — M6281 Muscle weakness (generalized): Secondary | ICD-10-CM | POA: Diagnosis not present

## 2017-03-17 DIAGNOSIS — G20 Parkinson's disease: Secondary | ICD-10-CM | POA: Diagnosis not present

## 2017-03-17 DIAGNOSIS — R41841 Cognitive communication deficit: Secondary | ICD-10-CM | POA: Diagnosis not present

## 2017-03-17 DIAGNOSIS — R1312 Dysphagia, oropharyngeal phase: Secondary | ICD-10-CM | POA: Diagnosis not present

## 2017-03-17 DIAGNOSIS — H542X11 Low vision right eye category 1, low vision left eye category 1: Secondary | ICD-10-CM | POA: Diagnosis not present

## 2017-03-20 DIAGNOSIS — R278 Other lack of coordination: Secondary | ICD-10-CM | POA: Diagnosis not present

## 2017-03-20 DIAGNOSIS — M6281 Muscle weakness (generalized): Secondary | ICD-10-CM | POA: Diagnosis not present

## 2017-03-20 DIAGNOSIS — H542X11 Low vision right eye category 1, low vision left eye category 1: Secondary | ICD-10-CM | POA: Diagnosis not present

## 2017-03-20 DIAGNOSIS — R41841 Cognitive communication deficit: Secondary | ICD-10-CM | POA: Diagnosis not present

## 2017-03-20 DIAGNOSIS — G20 Parkinson's disease: Secondary | ICD-10-CM | POA: Diagnosis not present

## 2017-03-20 DIAGNOSIS — R1312 Dysphagia, oropharyngeal phase: Secondary | ICD-10-CM | POA: Diagnosis not present

## 2017-03-21 DIAGNOSIS — R1312 Dysphagia, oropharyngeal phase: Secondary | ICD-10-CM | POA: Diagnosis not present

## 2017-03-21 DIAGNOSIS — R278 Other lack of coordination: Secondary | ICD-10-CM | POA: Diagnosis not present

## 2017-03-21 DIAGNOSIS — M6281 Muscle weakness (generalized): Secondary | ICD-10-CM | POA: Diagnosis not present

## 2017-03-21 DIAGNOSIS — G20 Parkinson's disease: Secondary | ICD-10-CM | POA: Diagnosis not present

## 2017-03-21 DIAGNOSIS — H542X11 Low vision right eye category 1, low vision left eye category 1: Secondary | ICD-10-CM | POA: Diagnosis not present

## 2017-03-21 DIAGNOSIS — R41841 Cognitive communication deficit: Secondary | ICD-10-CM | POA: Diagnosis not present

## 2017-03-22 DIAGNOSIS — H542X11 Low vision right eye category 1, low vision left eye category 1: Secondary | ICD-10-CM | POA: Diagnosis not present

## 2017-03-22 DIAGNOSIS — R41841 Cognitive communication deficit: Secondary | ICD-10-CM | POA: Diagnosis not present

## 2017-03-22 DIAGNOSIS — R1312 Dysphagia, oropharyngeal phase: Secondary | ICD-10-CM | POA: Diagnosis not present

## 2017-03-22 DIAGNOSIS — G20 Parkinson's disease: Secondary | ICD-10-CM | POA: Diagnosis not present

## 2017-03-22 DIAGNOSIS — R278 Other lack of coordination: Secondary | ICD-10-CM | POA: Diagnosis not present

## 2017-03-22 DIAGNOSIS — M6281 Muscle weakness (generalized): Secondary | ICD-10-CM | POA: Diagnosis not present

## 2017-03-23 DIAGNOSIS — H542X11 Low vision right eye category 1, low vision left eye category 1: Secondary | ICD-10-CM | POA: Diagnosis not present

## 2017-03-23 DIAGNOSIS — M6281 Muscle weakness (generalized): Secondary | ICD-10-CM | POA: Diagnosis not present

## 2017-03-23 DIAGNOSIS — G20 Parkinson's disease: Secondary | ICD-10-CM | POA: Diagnosis not present

## 2017-03-23 DIAGNOSIS — R1312 Dysphagia, oropharyngeal phase: Secondary | ICD-10-CM | POA: Diagnosis not present

## 2017-03-23 DIAGNOSIS — H353221 Exudative age-related macular degeneration, left eye, with active choroidal neovascularization: Secondary | ICD-10-CM | POA: Diagnosis not present

## 2017-03-23 DIAGNOSIS — R278 Other lack of coordination: Secondary | ICD-10-CM | POA: Diagnosis not present

## 2017-03-23 DIAGNOSIS — R41841 Cognitive communication deficit: Secondary | ICD-10-CM | POA: Diagnosis not present

## 2017-03-24 DIAGNOSIS — H542X11 Low vision right eye category 1, low vision left eye category 1: Secondary | ICD-10-CM | POA: Diagnosis not present

## 2017-03-24 DIAGNOSIS — R1312 Dysphagia, oropharyngeal phase: Secondary | ICD-10-CM | POA: Diagnosis not present

## 2017-03-24 DIAGNOSIS — G20 Parkinson's disease: Secondary | ICD-10-CM | POA: Diagnosis not present

## 2017-03-24 DIAGNOSIS — R278 Other lack of coordination: Secondary | ICD-10-CM | POA: Diagnosis not present

## 2017-03-24 DIAGNOSIS — R41841 Cognitive communication deficit: Secondary | ICD-10-CM | POA: Diagnosis not present

## 2017-03-24 DIAGNOSIS — M6281 Muscle weakness (generalized): Secondary | ICD-10-CM | POA: Diagnosis not present

## 2017-03-29 DIAGNOSIS — G20 Parkinson's disease: Secondary | ICD-10-CM | POA: Diagnosis not present

## 2017-03-29 DIAGNOSIS — R1312 Dysphagia, oropharyngeal phase: Secondary | ICD-10-CM | POA: Diagnosis not present

## 2017-03-29 DIAGNOSIS — R41841 Cognitive communication deficit: Secondary | ICD-10-CM | POA: Diagnosis not present

## 2017-03-29 DIAGNOSIS — R278 Other lack of coordination: Secondary | ICD-10-CM | POA: Diagnosis not present

## 2017-03-29 DIAGNOSIS — M6281 Muscle weakness (generalized): Secondary | ICD-10-CM | POA: Diagnosis not present

## 2017-03-29 DIAGNOSIS — H542X11 Low vision right eye category 1, low vision left eye category 1: Secondary | ICD-10-CM | POA: Diagnosis not present

## 2017-03-30 DIAGNOSIS — H542X11 Low vision right eye category 1, low vision left eye category 1: Secondary | ICD-10-CM | POA: Diagnosis not present

## 2017-03-30 DIAGNOSIS — R278 Other lack of coordination: Secondary | ICD-10-CM | POA: Diagnosis not present

## 2017-03-30 DIAGNOSIS — R41841 Cognitive communication deficit: Secondary | ICD-10-CM | POA: Diagnosis not present

## 2017-03-30 DIAGNOSIS — R1312 Dysphagia, oropharyngeal phase: Secondary | ICD-10-CM | POA: Diagnosis not present

## 2017-03-30 DIAGNOSIS — M6281 Muscle weakness (generalized): Secondary | ICD-10-CM | POA: Diagnosis not present

## 2017-03-30 DIAGNOSIS — G20 Parkinson's disease: Secondary | ICD-10-CM | POA: Diagnosis not present

## 2017-04-06 DIAGNOSIS — I69328 Other speech and language deficits following cerebral infarction: Secondary | ICD-10-CM | POA: Diagnosis not present

## 2017-04-06 DIAGNOSIS — G20 Parkinson's disease: Secondary | ICD-10-CM | POA: Diagnosis not present

## 2017-04-06 DIAGNOSIS — R1312 Dysphagia, oropharyngeal phase: Secondary | ICD-10-CM | POA: Diagnosis not present

## 2017-04-06 DIAGNOSIS — R41841 Cognitive communication deficit: Secondary | ICD-10-CM | POA: Diagnosis not present

## 2017-04-12 DIAGNOSIS — K219 Gastro-esophageal reflux disease without esophagitis: Secondary | ICD-10-CM | POA: Diagnosis not present

## 2017-04-12 DIAGNOSIS — E7800 Pure hypercholesterolemia, unspecified: Secondary | ICD-10-CM | POA: Diagnosis not present

## 2017-04-12 DIAGNOSIS — Z79899 Other long term (current) drug therapy: Secondary | ICD-10-CM | POA: Diagnosis not present

## 2017-04-12 DIAGNOSIS — Z0000 Encounter for general adult medical examination without abnormal findings: Secondary | ICD-10-CM | POA: Diagnosis not present

## 2017-04-12 DIAGNOSIS — Z1389 Encounter for screening for other disorder: Secondary | ICD-10-CM | POA: Diagnosis not present

## 2017-04-12 DIAGNOSIS — I679 Cerebrovascular disease, unspecified: Secondary | ICD-10-CM | POA: Diagnosis not present

## 2017-04-12 DIAGNOSIS — N400 Enlarged prostate without lower urinary tract symptoms: Secondary | ICD-10-CM | POA: Diagnosis not present

## 2017-04-27 DIAGNOSIS — H353221 Exudative age-related macular degeneration, left eye, with active choroidal neovascularization: Secondary | ICD-10-CM | POA: Diagnosis not present

## 2017-04-27 DIAGNOSIS — H43813 Vitreous degeneration, bilateral: Secondary | ICD-10-CM | POA: Diagnosis not present

## 2017-04-27 DIAGNOSIS — H353111 Nonexudative age-related macular degeneration, right eye, early dry stage: Secondary | ICD-10-CM | POA: Diagnosis not present

## 2017-06-01 DIAGNOSIS — H353221 Exudative age-related macular degeneration, left eye, with active choroidal neovascularization: Secondary | ICD-10-CM | POA: Diagnosis not present

## 2017-07-03 DIAGNOSIS — Z8673 Personal history of transient ischemic attack (TIA), and cerebral infarction without residual deficits: Secondary | ICD-10-CM | POA: Diagnosis not present

## 2017-07-03 DIAGNOSIS — Z7901 Long term (current) use of anticoagulants: Secondary | ICD-10-CM | POA: Diagnosis not present

## 2017-07-03 DIAGNOSIS — E8770 Fluid overload, unspecified: Secondary | ICD-10-CM | POA: Diagnosis not present

## 2017-07-03 DIAGNOSIS — G20 Parkinson's disease: Secondary | ICD-10-CM | POA: Diagnosis not present

## 2017-07-03 DIAGNOSIS — I480 Paroxysmal atrial fibrillation: Secondary | ICD-10-CM | POA: Diagnosis not present

## 2017-07-03 DIAGNOSIS — R600 Localized edema: Secondary | ICD-10-CM | POA: Diagnosis not present

## 2017-07-04 DIAGNOSIS — E785 Hyperlipidemia, unspecified: Secondary | ICD-10-CM | POA: Diagnosis not present

## 2017-07-04 DIAGNOSIS — I481 Persistent atrial fibrillation: Secondary | ICD-10-CM | POA: Diagnosis not present

## 2017-07-04 DIAGNOSIS — R609 Edema, unspecified: Secondary | ICD-10-CM | POA: Diagnosis not present

## 2017-07-04 DIAGNOSIS — I480 Paroxysmal atrial fibrillation: Secondary | ICD-10-CM | POA: Diagnosis not present

## 2017-07-04 DIAGNOSIS — E668 Other obesity: Secondary | ICD-10-CM | POA: Diagnosis not present

## 2017-07-04 DIAGNOSIS — Z7901 Long term (current) use of anticoagulants: Secondary | ICD-10-CM | POA: Diagnosis not present

## 2017-07-13 DIAGNOSIS — H353221 Exudative age-related macular degeneration, left eye, with active choroidal neovascularization: Secondary | ICD-10-CM | POA: Diagnosis not present

## 2017-07-14 DIAGNOSIS — Z7901 Long term (current) use of anticoagulants: Secondary | ICD-10-CM | POA: Diagnosis not present

## 2017-07-14 DIAGNOSIS — I481 Persistent atrial fibrillation: Secondary | ICD-10-CM | POA: Diagnosis not present

## 2017-07-14 DIAGNOSIS — R609 Edema, unspecified: Secondary | ICD-10-CM | POA: Diagnosis not present

## 2017-07-14 DIAGNOSIS — E668 Other obesity: Secondary | ICD-10-CM | POA: Diagnosis not present

## 2017-07-14 DIAGNOSIS — E785 Hyperlipidemia, unspecified: Secondary | ICD-10-CM | POA: Diagnosis not present

## 2017-07-14 DIAGNOSIS — I5042 Chronic combined systolic (congestive) and diastolic (congestive) heart failure: Secondary | ICD-10-CM | POA: Diagnosis not present

## 2017-08-01 DIAGNOSIS — E668 Other obesity: Secondary | ICD-10-CM | POA: Diagnosis not present

## 2017-08-01 DIAGNOSIS — I481 Persistent atrial fibrillation: Secondary | ICD-10-CM | POA: Diagnosis not present

## 2017-08-01 DIAGNOSIS — I5042 Chronic combined systolic (congestive) and diastolic (congestive) heart failure: Secondary | ICD-10-CM | POA: Diagnosis not present

## 2017-08-01 DIAGNOSIS — Z7901 Long term (current) use of anticoagulants: Secondary | ICD-10-CM | POA: Diagnosis not present

## 2017-08-01 DIAGNOSIS — E785 Hyperlipidemia, unspecified: Secondary | ICD-10-CM | POA: Diagnosis not present

## 2017-08-01 DIAGNOSIS — R609 Edema, unspecified: Secondary | ICD-10-CM | POA: Diagnosis not present

## 2017-08-22 DIAGNOSIS — H353221 Exudative age-related macular degeneration, left eye, with active choroidal neovascularization: Secondary | ICD-10-CM | POA: Diagnosis not present

## 2017-08-25 DIAGNOSIS — E785 Hyperlipidemia, unspecified: Secondary | ICD-10-CM | POA: Diagnosis not present

## 2017-08-25 DIAGNOSIS — E668 Other obesity: Secondary | ICD-10-CM | POA: Diagnosis not present

## 2017-08-25 DIAGNOSIS — I481 Persistent atrial fibrillation: Secondary | ICD-10-CM | POA: Diagnosis not present

## 2017-08-25 DIAGNOSIS — Z7901 Long term (current) use of anticoagulants: Secondary | ICD-10-CM | POA: Diagnosis not present

## 2017-08-25 DIAGNOSIS — R609 Edema, unspecified: Secondary | ICD-10-CM | POA: Diagnosis not present

## 2017-08-25 DIAGNOSIS — I5042 Chronic combined systolic (congestive) and diastolic (congestive) heart failure: Secondary | ICD-10-CM | POA: Diagnosis not present

## 2017-09-26 DIAGNOSIS — H353221 Exudative age-related macular degeneration, left eye, with active choroidal neovascularization: Secondary | ICD-10-CM | POA: Diagnosis not present

## 2017-09-26 DIAGNOSIS — H43813 Vitreous degeneration, bilateral: Secondary | ICD-10-CM | POA: Diagnosis not present

## 2017-09-26 DIAGNOSIS — H35371 Puckering of macula, right eye: Secondary | ICD-10-CM | POA: Diagnosis not present

## 2017-10-06 DIAGNOSIS — Z79899 Other long term (current) drug therapy: Secondary | ICD-10-CM | POA: Diagnosis not present

## 2017-10-06 DIAGNOSIS — I480 Paroxysmal atrial fibrillation: Secondary | ICD-10-CM | POA: Diagnosis not present

## 2017-10-06 DIAGNOSIS — Z7901 Long term (current) use of anticoagulants: Secondary | ICD-10-CM | POA: Diagnosis not present

## 2017-10-06 DIAGNOSIS — R600 Localized edema: Secondary | ICD-10-CM | POA: Diagnosis not present

## 2017-10-06 DIAGNOSIS — D509 Iron deficiency anemia, unspecified: Secondary | ICD-10-CM | POA: Diagnosis not present

## 2017-10-06 DIAGNOSIS — E7800 Pure hypercholesterolemia, unspecified: Secondary | ICD-10-CM | POA: Diagnosis not present

## 2017-10-06 DIAGNOSIS — R7989 Other specified abnormal findings of blood chemistry: Secondary | ICD-10-CM | POA: Diagnosis not present

## 2017-10-06 DIAGNOSIS — Z0000 Encounter for general adult medical examination without abnormal findings: Secondary | ICD-10-CM | POA: Diagnosis not present

## 2017-10-06 DIAGNOSIS — E8770 Fluid overload, unspecified: Secondary | ICD-10-CM | POA: Diagnosis not present

## 2017-10-31 DIAGNOSIS — H353221 Exudative age-related macular degeneration, left eye, with active choroidal neovascularization: Secondary | ICD-10-CM | POA: Diagnosis not present

## 2017-12-05 DIAGNOSIS — H353221 Exudative age-related macular degeneration, left eye, with active choroidal neovascularization: Secondary | ICD-10-CM | POA: Diagnosis not present

## 2017-12-07 ENCOUNTER — Other Ambulatory Visit: Payer: Self-pay | Admitting: Cardiology

## 2017-12-07 ENCOUNTER — Ambulatory Visit
Admission: RE | Admit: 2017-12-07 | Discharge: 2017-12-07 | Disposition: A | Discharge status: Home / Self Care | Payer: Medicare Other | Source: Ambulatory Visit | Attending: Cardiology | Admitting: Cardiology

## 2017-12-07 DIAGNOSIS — E668 Other obesity: Secondary | ICD-10-CM | POA: Diagnosis not present

## 2017-12-07 DIAGNOSIS — R059 Cough, unspecified: Secondary | ICD-10-CM

## 2017-12-07 DIAGNOSIS — Z7901 Long term (current) use of anticoagulants: Secondary | ICD-10-CM | POA: Diagnosis not present

## 2017-12-07 DIAGNOSIS — E785 Hyperlipidemia, unspecified: Secondary | ICD-10-CM | POA: Diagnosis not present

## 2017-12-07 DIAGNOSIS — R05 Cough: Secondary | ICD-10-CM

## 2017-12-07 DIAGNOSIS — I481 Persistent atrial fibrillation: Secondary | ICD-10-CM | POA: Diagnosis not present

## 2017-12-07 DIAGNOSIS — I5042 Chronic combined systolic (congestive) and diastolic (congestive) heart failure: Secondary | ICD-10-CM | POA: Diagnosis not present

## 2017-12-07 DIAGNOSIS — R609 Edema, unspecified: Secondary | ICD-10-CM | POA: Diagnosis not present

## 2017-12-08 DIAGNOSIS — R944 Abnormal results of kidney function studies: Secondary | ICD-10-CM | POA: Diagnosis not present

## 2017-12-08 DIAGNOSIS — E611 Iron deficiency: Secondary | ICD-10-CM | POA: Diagnosis not present

## 2017-12-08 DIAGNOSIS — R531 Weakness: Secondary | ICD-10-CM | POA: Diagnosis not present

## 2017-12-08 DIAGNOSIS — T17908A Unspecified foreign body in respiratory tract, part unspecified causing other injury, initial encounter: Secondary | ICD-10-CM | POA: Diagnosis not present

## 2017-12-12 DIAGNOSIS — N183 Chronic kidney disease, stage 3 (moderate): Secondary | ICD-10-CM | POA: Diagnosis not present

## 2017-12-12 DIAGNOSIS — Z7901 Long term (current) use of anticoagulants: Secondary | ICD-10-CM | POA: Diagnosis not present

## 2017-12-12 DIAGNOSIS — I480 Paroxysmal atrial fibrillation: Secondary | ICD-10-CM | POA: Diagnosis not present

## 2017-12-12 DIAGNOSIS — G20 Parkinson's disease: Secondary | ICD-10-CM | POA: Diagnosis not present

## 2017-12-12 DIAGNOSIS — Z8572 Personal history of non-Hodgkin lymphomas: Secondary | ICD-10-CM | POA: Diagnosis not present

## 2017-12-12 DIAGNOSIS — R1310 Dysphagia, unspecified: Secondary | ICD-10-CM | POA: Diagnosis not present

## 2017-12-12 DIAGNOSIS — Z8673 Personal history of transient ischemic attack (TIA), and cerebral infarction without residual deficits: Secondary | ICD-10-CM | POA: Diagnosis not present

## 2017-12-19 DIAGNOSIS — Z8673 Personal history of transient ischemic attack (TIA), and cerebral infarction without residual deficits: Secondary | ICD-10-CM | POA: Diagnosis not present

## 2017-12-19 DIAGNOSIS — N183 Chronic kidney disease, stage 3 (moderate): Secondary | ICD-10-CM | POA: Diagnosis not present

## 2017-12-19 DIAGNOSIS — I480 Paroxysmal atrial fibrillation: Secondary | ICD-10-CM | POA: Diagnosis not present

## 2017-12-19 DIAGNOSIS — R1310 Dysphagia, unspecified: Secondary | ICD-10-CM | POA: Diagnosis not present

## 2017-12-19 DIAGNOSIS — Z7901 Long term (current) use of anticoagulants: Secondary | ICD-10-CM | POA: Diagnosis not present

## 2017-12-19 DIAGNOSIS — G20 Parkinson's disease: Secondary | ICD-10-CM | POA: Diagnosis not present

## 2017-12-21 DIAGNOSIS — N183 Chronic kidney disease, stage 3 (moderate): Secondary | ICD-10-CM | POA: Diagnosis not present

## 2017-12-21 DIAGNOSIS — Z7901 Long term (current) use of anticoagulants: Secondary | ICD-10-CM | POA: Diagnosis not present

## 2017-12-21 DIAGNOSIS — G20 Parkinson's disease: Secondary | ICD-10-CM | POA: Diagnosis not present

## 2017-12-21 DIAGNOSIS — Z8673 Personal history of transient ischemic attack (TIA), and cerebral infarction without residual deficits: Secondary | ICD-10-CM | POA: Diagnosis not present

## 2017-12-21 DIAGNOSIS — R1310 Dysphagia, unspecified: Secondary | ICD-10-CM | POA: Diagnosis not present

## 2017-12-21 DIAGNOSIS — I480 Paroxysmal atrial fibrillation: Secondary | ICD-10-CM | POA: Diagnosis not present

## 2017-12-22 ENCOUNTER — Emergency Department (HOSPITAL_COMMUNITY): Payer: Medicare Other

## 2017-12-22 ENCOUNTER — Encounter (HOSPITAL_COMMUNITY): Payer: Self-pay | Admitting: Emergency Medicine

## 2017-12-22 ENCOUNTER — Inpatient Hospital Stay (HOSPITAL_COMMUNITY)
Admission: EM | Admit: 2017-12-22 | Discharge: 2017-12-27 | DRG: 871 | Disposition: A | Discharge status: Skilled Nursing Facility | Payer: Medicare Other | Attending: Internal Medicine | Admitting: Internal Medicine

## 2017-12-22 DIAGNOSIS — Z96652 Presence of left artificial knee joint: Secondary | ICD-10-CM | POA: Diagnosis present

## 2017-12-22 DIAGNOSIS — N183 Chronic kidney disease, stage 3 (moderate): Secondary | ICD-10-CM | POA: Diagnosis present

## 2017-12-22 DIAGNOSIS — Z8719 Personal history of other diseases of the digestive system: Secondary | ICD-10-CM

## 2017-12-22 DIAGNOSIS — E785 Hyperlipidemia, unspecified: Secondary | ICD-10-CM | POA: Diagnosis present

## 2017-12-22 DIAGNOSIS — Z66 Do not resuscitate: Secondary | ICD-10-CM | POA: Diagnosis present

## 2017-12-22 DIAGNOSIS — M2550 Pain in unspecified joint: Secondary | ICD-10-CM | POA: Diagnosis not present

## 2017-12-22 DIAGNOSIS — Y95 Nosocomial condition: Secondary | ICD-10-CM | POA: Diagnosis present

## 2017-12-22 DIAGNOSIS — Z85028 Personal history of other malignant neoplasm of stomach: Secondary | ICD-10-CM

## 2017-12-22 DIAGNOSIS — Z8711 Personal history of peptic ulcer disease: Secondary | ICD-10-CM

## 2017-12-22 DIAGNOSIS — I480 Paroxysmal atrial fibrillation: Secondary | ICD-10-CM | POA: Diagnosis present

## 2017-12-22 DIAGNOSIS — R2689 Other abnormalities of gait and mobility: Secondary | ICD-10-CM | POA: Diagnosis not present

## 2017-12-22 DIAGNOSIS — Z79899 Other long term (current) drug therapy: Secondary | ICD-10-CM | POA: Diagnosis not present

## 2017-12-22 DIAGNOSIS — K219 Gastro-esophageal reflux disease without esophagitis: Secondary | ICD-10-CM | POA: Diagnosis present

## 2017-12-22 DIAGNOSIS — I5032 Chronic diastolic (congestive) heart failure: Secondary | ICD-10-CM | POA: Diagnosis present

## 2017-12-22 DIAGNOSIS — A4181 Sepsis due to Enterococcus: Principal | ICD-10-CM | POA: Diagnosis present

## 2017-12-22 DIAGNOSIS — I6300 Cerebral infarction due to thrombosis of unspecified precerebral artery: Secondary | ICD-10-CM | POA: Diagnosis not present

## 2017-12-22 DIAGNOSIS — R296 Repeated falls: Secondary | ICD-10-CM | POA: Diagnosis not present

## 2017-12-22 DIAGNOSIS — R064 Hyperventilation: Secondary | ICD-10-CM | POA: Diagnosis not present

## 2017-12-22 DIAGNOSIS — J90 Pleural effusion, not elsewhere classified: Secondary | ICD-10-CM | POA: Diagnosis not present

## 2017-12-22 DIAGNOSIS — Z8572 Personal history of non-Hodgkin lymphomas: Secondary | ICD-10-CM | POA: Diagnosis not present

## 2017-12-22 DIAGNOSIS — Z8673 Personal history of transient ischemic attack (TIA), and cerebral infarction without residual deficits: Secondary | ICD-10-CM

## 2017-12-22 DIAGNOSIS — R069 Unspecified abnormalities of breathing: Secondary | ICD-10-CM | POA: Diagnosis not present

## 2017-12-22 DIAGNOSIS — J189 Pneumonia, unspecified organism: Secondary | ICD-10-CM | POA: Diagnosis present

## 2017-12-22 DIAGNOSIS — Z7901 Long term (current) use of anticoagulants: Secondary | ICD-10-CM

## 2017-12-22 DIAGNOSIS — R1310 Dysphagia, unspecified: Secondary | ICD-10-CM | POA: Diagnosis not present

## 2017-12-22 DIAGNOSIS — R0902 Hypoxemia: Secondary | ICD-10-CM | POA: Diagnosis not present

## 2017-12-22 DIAGNOSIS — Z7401 Bed confinement status: Secondary | ICD-10-CM | POA: Diagnosis not present

## 2017-12-22 DIAGNOSIS — R197 Diarrhea, unspecified: Secondary | ICD-10-CM | POA: Diagnosis present

## 2017-12-22 DIAGNOSIS — R0602 Shortness of breath: Secondary | ICD-10-CM | POA: Diagnosis not present

## 2017-12-22 DIAGNOSIS — Z515 Encounter for palliative care: Secondary | ICD-10-CM | POA: Diagnosis not present

## 2017-12-22 DIAGNOSIS — E860 Dehydration: Secondary | ICD-10-CM | POA: Diagnosis present

## 2017-12-22 DIAGNOSIS — A419 Sepsis, unspecified organism: Secondary | ICD-10-CM | POA: Diagnosis present

## 2017-12-22 DIAGNOSIS — R1312 Dysphagia, oropharyngeal phase: Secondary | ICD-10-CM | POA: Diagnosis not present

## 2017-12-22 DIAGNOSIS — I639 Cerebral infarction, unspecified: Secondary | ICD-10-CM | POA: Diagnosis present

## 2017-12-22 DIAGNOSIS — J690 Pneumonitis due to inhalation of food and vomit: Secondary | ICD-10-CM | POA: Diagnosis present

## 2017-12-22 DIAGNOSIS — T501X5A Adverse effect of loop [high-ceiling] diuretics, initial encounter: Secondary | ICD-10-CM | POA: Diagnosis present

## 2017-12-22 DIAGNOSIS — Z87891 Personal history of nicotine dependence: Secondary | ICD-10-CM

## 2017-12-22 DIAGNOSIS — N390 Urinary tract infection, site not specified: Secondary | ICD-10-CM

## 2017-12-22 DIAGNOSIS — K2270 Barrett's esophagus without dysplasia: Secondary | ICD-10-CM | POA: Diagnosis present

## 2017-12-22 DIAGNOSIS — G20 Parkinson's disease: Secondary | ICD-10-CM | POA: Diagnosis present

## 2017-12-22 DIAGNOSIS — Z9221 Personal history of antineoplastic chemotherapy: Secondary | ICD-10-CM

## 2017-12-22 DIAGNOSIS — N179 Acute kidney failure, unspecified: Secondary | ICD-10-CM | POA: Diagnosis not present

## 2017-12-22 DIAGNOSIS — Z7189 Other specified counseling: Secondary | ICD-10-CM

## 2017-12-22 DIAGNOSIS — R918 Other nonspecific abnormal finding of lung field: Secondary | ICD-10-CM | POA: Diagnosis present

## 2017-12-22 DIAGNOSIS — J168 Pneumonia due to other specified infectious organisms: Secondary | ICD-10-CM | POA: Diagnosis not present

## 2017-12-22 DIAGNOSIS — Z86010 Personal history of colonic polyps: Secondary | ICD-10-CM

## 2017-12-22 DIAGNOSIS — R498 Other voice and resonance disorders: Secondary | ICD-10-CM | POA: Diagnosis not present

## 2017-12-22 DIAGNOSIS — Z8551 Personal history of malignant neoplasm of bladder: Secondary | ICD-10-CM

## 2017-12-22 DIAGNOSIS — I69991 Dysphagia following unspecified cerebrovascular disease: Secondary | ICD-10-CM | POA: Diagnosis not present

## 2017-12-22 DIAGNOSIS — I4891 Unspecified atrial fibrillation: Secondary | ICD-10-CM | POA: Diagnosis not present

## 2017-12-22 DIAGNOSIS — G8190 Hemiplegia, unspecified affecting unspecified side: Secondary | ICD-10-CM | POA: Diagnosis not present

## 2017-12-22 DIAGNOSIS — Z923 Personal history of irradiation: Secondary | ICD-10-CM

## 2017-12-22 DIAGNOSIS — I69921 Dysphasia following unspecified cerebrovascular disease: Secondary | ICD-10-CM | POA: Diagnosis not present

## 2017-12-22 DIAGNOSIS — M6281 Muscle weakness (generalized): Secondary | ICD-10-CM | POA: Diagnosis not present

## 2017-12-22 DIAGNOSIS — R2681 Unsteadiness on feet: Secondary | ICD-10-CM | POA: Diagnosis not present

## 2017-12-22 DIAGNOSIS — R7881 Bacteremia: Secondary | ICD-10-CM | POA: Diagnosis not present

## 2017-12-22 DIAGNOSIS — Z8546 Personal history of malignant neoplasm of prostate: Secondary | ICD-10-CM

## 2017-12-22 DIAGNOSIS — R488 Other symbolic dysfunctions: Secondary | ICD-10-CM | POA: Diagnosis not present

## 2017-12-22 DIAGNOSIS — B952 Enterococcus as the cause of diseases classified elsewhere: Secondary | ICD-10-CM | POA: Diagnosis not present

## 2017-12-22 HISTORY — DX: Heart failure, unspecified: I50.9

## 2017-12-22 LAB — I-STAT TROPONIN, ED: TROPONIN I, POC: 0.02 ng/mL (ref 0.00–0.08)

## 2017-12-22 LAB — URINALYSIS, ROUTINE W REFLEX MICROSCOPIC
BILIRUBIN URINE: NEGATIVE
GLUCOSE, UA: NEGATIVE mg/dL
HGB URINE DIPSTICK: NEGATIVE
KETONES UR: NEGATIVE mg/dL
NITRITE: NEGATIVE
Protein, ur: NEGATIVE mg/dL
Specific Gravity, Urine: 1.012 (ref 1.005–1.030)
pH: 6 (ref 5.0–8.0)

## 2017-12-22 LAB — COMPREHENSIVE METABOLIC PANEL
ALBUMIN: 2.9 g/dL — AB (ref 3.5–5.0)
ALT: 6 U/L (ref 0–44)
ANION GAP: 14 (ref 5–15)
AST: 21 U/L (ref 15–41)
Alkaline Phosphatase: 35 U/L — ABNORMAL LOW (ref 38–126)
BUN: 19 mg/dL (ref 8–23)
CHLORIDE: 101 mmol/L (ref 98–111)
CO2: 26 mmol/L (ref 22–32)
Calcium: 8.7 mg/dL — ABNORMAL LOW (ref 8.9–10.3)
Creatinine, Ser: 1.59 mg/dL — ABNORMAL HIGH (ref 0.61–1.24)
GFR calc Af Amer: 43 mL/min — ABNORMAL LOW (ref >60)
GFR calc non Af Amer: 37 mL/min — ABNORMAL LOW (ref >60)
GLUCOSE: 103 mg/dL — AB (ref 70–99)
POTASSIUM: 4.2 mmol/L (ref 3.5–5.1)
SODIUM: 141 mmol/L (ref 135–145)
TOTAL PROTEIN: 8.4 g/dL — AB (ref 6.5–8.1)
Total Bilirubin: 0.8 mg/dL (ref 0.3–1.2)

## 2017-12-22 LAB — RESPIRATORY PANEL BY PCR
Adenovirus: NOT DETECTED
BORDETELLA PERTUSSIS-RVPCR: NOT DETECTED
CHLAMYDOPHILA PNEUMONIAE-RVPPCR: NOT DETECTED
CORONAVIRUS 229E-RVPPCR: NOT DETECTED
CORONAVIRUS HKU1-RVPPCR: NOT DETECTED
Coronavirus NL63: NOT DETECTED
Coronavirus OC43: NOT DETECTED
INFLUENZA B-RVPPCR: NOT DETECTED
Influenza A: NOT DETECTED
MYCOPLASMA PNEUMONIAE-RVPPCR: NOT DETECTED
Metapneumovirus: NOT DETECTED
PARAINFLUENZA VIRUS 2-RVPPCR: NOT DETECTED
Parainfluenza Virus 1: NOT DETECTED
Parainfluenza Virus 3: NOT DETECTED
Parainfluenza Virus 4: NOT DETECTED
RESPIRATORY SYNCYTIAL VIRUS-RVPPCR: NOT DETECTED
Rhinovirus / Enterovirus: NOT DETECTED

## 2017-12-22 LAB — LACTIC ACID, PLASMA: LACTIC ACID, VENOUS: 1.1 mmol/L (ref 0.5–1.9)

## 2017-12-22 LAB — CBC
HCT: 43.9 % (ref 39.0–52.0)
Hemoglobin: 13.3 g/dL (ref 13.0–17.0)
MCH: 26.7 pg (ref 26.0–34.0)
MCHC: 30.3 g/dL (ref 30.0–36.0)
MCV: 88.2 fL (ref 78.0–100.0)
PLATELETS: 192 10*3/uL (ref 150–400)
RBC: 4.98 MIL/uL (ref 4.22–5.81)
RDW: 14.5 % (ref 11.5–15.5)
WBC: 13.7 10*3/uL — ABNORMAL HIGH (ref 4.0–10.5)

## 2017-12-22 LAB — MRSA PCR SCREENING: MRSA by PCR: NEGATIVE

## 2017-12-22 LAB — I-STAT CG4 LACTIC ACID, ED: Lactic Acid, Venous: 1.64 mmol/L (ref 0.5–1.9)

## 2017-12-22 LAB — STREP PNEUMONIAE URINARY ANTIGEN: Strep Pneumo Urinary Antigen: NEGATIVE

## 2017-12-22 MED ORDER — SODIUM CHLORIDE 0.9 % IV BOLUS: 1000.0000 mL | Freq: Once | INTRAVENOUS | Status: DC

## 2017-12-22 MED ORDER — FERROUS SULFATE 325 (65 FE) MG PO TABS
325.0000 mg | ORAL_TABLET | Freq: Every day | ORAL | Status: DC
  Administered 2017-12-23 – 2017-12-27 (×5): 325 mg via ORAL
  Filled 2017-12-22 (×6): qty 1

## 2017-12-22 MED ORDER — ACETAMINOPHEN 325 MG PO TABS: 650.0000 mg | ORAL_TABLET | Freq: Four times a day (QID) | ORAL | Status: DC | PRN

## 2017-12-22 MED ORDER — ALBUTEROL SULFATE (2.5 MG/3ML) 0.083% IN NEBU: 2.5000 mg | INHALATION_SOLUTION | RESPIRATORY_TRACT | Status: DC | PRN

## 2017-12-22 MED ORDER — PANTOPRAZOLE SODIUM 40 MG PO TBEC
80.0000 mg | DELAYED_RELEASE_TABLET | Freq: Every day | ORAL | Status: DC
  Administered 2017-12-23 – 2017-12-27 (×5): 80 mg via ORAL
  Filled 2017-12-22 (×5): qty 2

## 2017-12-22 MED ORDER — TAMSULOSIN HCL 0.4 MG PO CAPS
0.4000 mg | ORAL_CAPSULE | Freq: Every day | ORAL | Status: DC
  Administered 2017-12-22 – 2017-12-26 (×5): 0.4 mg via ORAL
  Filled 2017-12-22 (×6): qty 1

## 2017-12-22 MED ORDER — APIXABAN 2.5 MG PO TABS
2.5000 mg | ORAL_TABLET | Freq: Two times a day (BID) | ORAL | Status: DC
  Administered 2017-12-23 – 2017-12-25 (×6): 2.5 mg via ORAL
  Filled 2017-12-22 (×6): qty 1

## 2017-12-22 MED ORDER — MELATONIN 3 MG PO TABS
3.0000 mg | ORAL_TABLET | Freq: Every day | ORAL | Status: DC
  Administered 2017-12-23 – 2017-12-26 (×4): 3 mg via ORAL
  Filled 2017-12-22 (×7): qty 1

## 2017-12-22 MED ORDER — POLYVINYL ALCOHOL 1.4 % OP SOLN
1.0000 [drp] | OPHTHALMIC | Status: DC | PRN
  Filled 2017-12-22: qty 15

## 2017-12-22 MED ORDER — PIPERACILLIN-TAZOBACTAM 3.375 G IVPB
3.3750 g | Freq: Three times a day (TID) | INTRAVENOUS | Status: DC
  Administered 2017-12-23: 3.375 g via INTRAVENOUS
  Filled 2017-12-22 (×4): qty 50

## 2017-12-22 MED ORDER — ONDANSETRON HCL 4 MG/2ML IJ SOLN: 4.0000 mg | Freq: Three times a day (TID) | INTRAMUSCULAR | Status: DC | PRN

## 2017-12-22 MED ORDER — TRAMADOL HCL 50 MG PO TABS: 50.0000 mg | ORAL_TABLET | Freq: Three times a day (TID) | ORAL | Status: DC | PRN

## 2017-12-22 MED ORDER — CARBIDOPA-LEVODOPA 25-100 MG PO TABS
1.0000 | ORAL_TABLET | Freq: Three times a day (TID) | ORAL | Status: DC
  Administered 2017-12-23 – 2017-12-27 (×15): 1 via ORAL
  Filled 2017-12-22 (×15): qty 1

## 2017-12-22 MED ORDER — PROSIGHT PO TABS
1.0000 | ORAL_TABLET | Freq: Every day | ORAL | Status: DC
  Administered 2017-12-23 – 2017-12-27 (×6): 1 via ORAL
  Filled 2017-12-22 (×7): qty 1

## 2017-12-22 MED ORDER — METOPROLOL TARTRATE 25 MG PO TABS
25.0000 mg | ORAL_TABLET | Freq: Two times a day (BID) | ORAL | Status: DC
  Administered 2017-12-22 – 2017-12-27 (×10): 25 mg via ORAL
  Filled 2017-12-22 (×10): qty 1

## 2017-12-22 MED ORDER — ICAPS MV PO TABS: ORAL_TABLET | Freq: Two times a day (BID) | ORAL | Status: DC

## 2017-12-22 MED ORDER — ACETAMINOPHEN 325 MG PO TABS
650.0000 mg | ORAL_TABLET | Freq: Once | ORAL | Status: AC
  Administered 2017-12-22: 650 mg via ORAL
  Filled 2017-12-22: qty 2

## 2017-12-22 MED ORDER — DM-GUAIFENESIN ER 30-600 MG PO TB12
1.0000 | ORAL_TABLET | Freq: Two times a day (BID) | ORAL | Status: DC | PRN
  Administered 2017-12-27: 1 via ORAL
  Filled 2017-12-22: qty 1

## 2017-12-22 MED ORDER — VANCOMYCIN HCL IN DEXTROSE 1-5 GM/200ML-% IV SOLN
1000.0000 mg | Freq: Once | INTRAVENOUS | Status: AC
  Administered 2017-12-22: 1000 mg via INTRAVENOUS
  Filled 2017-12-22: qty 200

## 2017-12-22 MED ORDER — SODIUM CHLORIDE 0.9 % IV SOLN
1.0000 g | Freq: Once | INTRAVENOUS | Status: AC
  Administered 2017-12-22: 1 g via INTRAVENOUS
  Filled 2017-12-22: qty 1

## 2017-12-22 MED ORDER — ZOLPIDEM TARTRATE 5 MG PO TABS
5.0000 mg | ORAL_TABLET | Freq: Every evening | ORAL | Status: DC | PRN
  Administered 2017-12-25: 5 mg via ORAL
  Filled 2017-12-22 (×2): qty 1

## 2017-12-22 MED ORDER — OMEGA-3 FATTY ACIDS 1000 MG PO CAPS: 1.0000 g | ORAL_CAPSULE | Freq: Two times a day (BID) | ORAL | Status: DC

## 2017-12-22 MED ORDER — SODIUM CHLORIDE 0.9 % IV BOLUS
1000.0000 mL | Freq: Once | INTRAVENOUS | Status: AC
  Administered 2017-12-22: 1000 mL via INTRAVENOUS

## 2017-12-22 MED ORDER — FLUTICASONE PROPIONATE 50 MCG/ACT NA SUSP
2.0000 | Freq: Every day | NASAL | Status: DC
  Administered 2017-12-23 – 2017-12-27 (×5): 2 via NASAL
  Filled 2017-12-22: qty 16

## 2017-12-22 MED ORDER — SIMVASTATIN 20 MG PO TABS
20.0000 mg | ORAL_TABLET | Freq: Every evening | ORAL | Status: DC
  Administered 2017-12-23 – 2017-12-27 (×6): 20 mg via ORAL
  Filled 2017-12-22 (×6): qty 1

## 2017-12-22 MED ORDER — POLYETHYL GLYCOL-PROPYL GLYCOL 0.4-0.3 % OP SOLN: 1.0000 [drp] | Freq: Every day | OPHTHALMIC | Status: DC | PRN

## 2017-12-22 MED ORDER — SODIUM CHLORIDE 0.9 % IV SOLN
INTRAVENOUS | Status: DC
  Administered 2017-12-22: 21:00:00 via INTRAVENOUS

## 2017-12-22 MED ORDER — SODIUM CHLORIDE 0.9 % IV SOLN: 500.0000 mg | INTRAVENOUS | Status: DC

## 2017-12-22 MED ORDER — VANCOMYCIN HCL 10 G IV SOLR
1250.0000 mg | INTRAVENOUS | Status: DC
  Administered 2017-12-23: 1250 mg via INTRAVENOUS
  Filled 2017-12-22: qty 1250

## 2017-12-22 MED ORDER — OMEGA-3-ACID ETHYL ESTERS 1 G PO CAPS
1.0000 g | ORAL_CAPSULE | Freq: Two times a day (BID) | ORAL | Status: DC
  Administered 2017-12-22 – 2017-12-27 (×10): 1 g via ORAL
  Filled 2017-12-22 (×10): qty 1

## 2017-12-22 NOTE — Progress Notes (Addendum)
Pharmacy Antibiotic Note  Jason [name] is a 84 y.o. male admitted on 12/22/2017 with pneumonia.  Pharmacy has been consulted for vancomycin and zosyn dosing.   Patient received 1000mg  IV x1 before consult. He is approx 95 kg so will time second dose early to account for low loading dose. PTA patient finished a course of doxycycline on 12/19/16. WBC 13.7, Tmax 100.1, sCr 1.59, CrCl ~37  Plan: Vancomycin 1250mg  Q24H Zosyn 3.375mg  Q8H F/u LOT/de-escalation, cultures, renal function, troughs prn      Temp (24hrs), Avg:100.1 F (37.8 C), Min:100.1 F (37.8 C), Max:100.1 F (37.8 C)  Recent Labs  Lab 12/22/17 1653 12/22/17 1832  WBC 13.7*  --   CREATININE 1.59*  --   LATICACIDVEN  --  1.64    CrCl cannot be calculated (Unknown ideal weight.).    No Known Allergies  Antimicrobials this admission: vanc  9/20 >>  zosyn 9/20 >>   Dose adjustments this admission:   Microbiology results: 9/20 BCx:   Thank you for allowing pharmacy to be a part of this patient's care.  Harrietta Guardian, PharmD PGY1 Pharmacy Resident 12/22/2017    8:59 PM

## 2017-12-22 NOTE — ED Provider Notes (Signed)
[facility] EMERGENCY DEPARTMENT Provider Note   CSN: [id number] Arrival date & time: 12/22/17  1632     History   Chief Complaint Chief Complaint  Patient presents with  . Abdominal Pain  . Nausea    HPI Jason [name] is a 84 y.o. male.  The history is provided by the patient.  Shortness of Breath  This is a new problem. The average episode lasts 4 hours. The problem occurs continuously.The current episode started 3 to 5 hours ago. The problem has not changed since onset.Associated symptoms include abdominal pain (resolved). Pertinent negatives include no fever, no headaches, no rhinorrhea, no cough, no sputum production, no orthopnea, no vomiting and no rash. Associated medical issues include pneumonia (3 weeks ago) and heart failure.    Past Medical History:  Diagnosis Date  . Arthritis   . Barrett esophagus   . Bladder cancer ([place]) dx'd 2376,2831   surg only  . CHF (congestive heart failure) ([place])   . Colon polyps    adenomatous  . Difficulty sleeping    OCCASIONALLY  . Diverticulosis of colon (without mention of hemorrhage)   . Frequency of urination   . GERD (gastroesophageal reflux disease)   . Hiatal hernia   . History of bladder cancer 1981   EXCISION ONLY  . History of skin cancer    BASAL CELL  . Hyperlipidemia   . Lymphoma, non-Hodgkin's ([place]) 2011   [place]   . Macular degeneration    LEFT EYE  . Multiple nodules of lung 09/16/2011  . PAF (paroxysmal atrial fibrillation) ([place])   . Personal history of colonic polyps 07/15/2010   tubular adenoma  . Prostate cancer [facility])    patient denies  . PUD (peptic ulcer disease)   . Stomach cancer ([place])    NHL origin  . Stroke ([place])   . Weakness     Patient Active Problem List   Diagnosis Date Noted  . Fall at home 10/19/2016  . Gait abnormality 09/05/2016  . Parkinsonism ([place]) 09/05/2016  . CVA (cerebral vascular accident) ([place]) 07/01/2016  . Dysarthria   . History of cancer    . TIA (transient ischemic attack) 06/30/2016  . Hemiplegia affecting dominant side ([place]) 06/30/2016  . History of bladder cancer   . Dysphagia   . Hyperglycemia   . Stage 3 chronic kidney disease ([place])   . Acute blood loss anemia   . Leukocytosis   . Weakness   . Community acquired pneumonia 06/16/2016  . Unresponsiveness   . CAP (community acquired pneumonia) 06/15/2016  . PAF (paroxysmal atrial fibrillation) ([place]) 06/15/2016  . Pressure injury of skin 06/15/2016  . Lobar pneumonia ([place])   . Acute congestive heart failure ([place])   . Primary osteoarthritis of left knee 07/30/2015  . BPH (benign prostatic hyperplasia) 07/16/2012  . Multiple nodules of lung 09/16/2011  . GERD (gastroesophageal reflux disease) 06/14/2011  . Constipation, chronic 06/14/2011  . NHL (non-Hodgkin's lymphoma) ([place]) 01/03/2010  . Malignant neoplasm of bladder ([place]) 05/28/2007  . Hyperlipidemia 05/28/2007  . HIATAL HERNIA 05/28/2007  . BARRETT'S ESOPHAGUS, HX OF 05/28/2007    Past Surgical History:  Procedure Laterality Date  . APPENDECTOMY    . bone spur     left shoulder  . CHOLECYSTECTOMY    . KNEE ARTHROPLASTY Left 07/30/2015   Procedure: LEFT TOTAL KNEE ARTHROPLASTY WITH COMPUTER NAVIGATION;  Surgeon: Rod Can, MD;  Location: [facility];  Service: Orthopedics;  Laterality: Left;  . LASER  OF PROSTATE W/ GREEN LIGHT PVP    . TENDON REPAIR     right arm  . TRIGGER FINGER RELEASE     bilateral hands  . WRIST SURGERY     Bilateral        Home Medications    Prior to Admission medications   Medication Sig Start Date End Date Taking? Authorizing Provider  apixaban (ELIQUIS) 5 MG TABS tablet Take 5 mg by mouth 2 (two) times daily.    [provider]  benzonatate (TESSALON) 100 MG capsule Take 100 mg by mouth every 8 (eight) hours as needed for cough. 10/17/16   [provider]  carbidopa-levodopa (SINEMET IR) 25-100 MG tablet Take 1 tablet by mouth 3 (three) times daily.  09/05/16   Marcial Pacas, MD  carbidopa-levodopa (SINEMET IR) 25-100 MG tablet Take 1 tablet by mouth 3 (three) times daily. Take at 8am, 12noon, 5pm 01/12/17   Marcial Pacas, MD  Docusate Sodium (COLACE PO) Take 100 mg by mouth at bedtime.     [provider]  esomeprazole (NEXIUM) 40 MG capsule TAKE 1 CAPSULE (40 MG TOTAL) BY MOUTH 2 (TWO) TIMES DAILY BEFORE A MEAL. 03/17/16   Irene Shipper, MD  fish oil-omega-3 fatty acids 1000 MG capsule Take 1 g by mouth 2 (two) times daily.     [provider]  Melatonin 3 MG TABS Take 3 mg by mouth at bedtime.    [provider]  Multiple Vitamins-Minerals (ICAPS MV PO) Take 1 capsule by mouth 2 (two) times daily.     [provider]  Polyethyl Glycol-Propyl Glycol (SYSTANE) 0.4-0.3 % SOLN Place 1 drop into both eyes daily as needed (for dry eyes).     [provider]  senna (SENOKOT) 8.6 MG tablet Take 1 tablet by mouth daily.    [provider]  simvastatin (ZOCOR) 40 MG tablet Take 40 mg by mouth at bedtime.      [provider]  tamsulosin (FLOMAX) 0.4 MG CAPS capsule Take 0.4 mg by mouth at bedtime.    [provider]  traMADol (ULTRAM) 50 MG tablet Take 1 tablet (50 mg total) by mouth every 6 (six) hours. 10/28/16   Meuth, Blaine Hamper, PA-C    Family History Family History  Problem Relation Age of Onset  . Heart attack Father   . Other Mother        "old age"    Social History Social History   Tobacco Use  . Smoking status: Former Smoker    Last attempt to quit: 10/29/1966    Years since quitting: 51.1  . Smokeless tobacco: Former Systems developer    Quit date: 06/29/1998  Substance Use Topics  . Alcohol use: Yes    Comment: Socially  . Drug use: No     Allergies   Patient has no known allergies.   Review of Systems Review of Systems  Constitutional: Negative for fever.  HENT: Negative for rhinorrhea.   Eyes: Negative for pain.  Respiratory: Positive for shortness of breath.  Negative for cough and sputum production.   Cardiovascular: Negative for orthopnea.  Gastrointestinal: Positive for abdominal pain (resolved) and nausea (resolved). Negative for vomiting.  Genitourinary: Negative for decreased urine volume and dysuria.  Musculoskeletal: Negative for back pain.  Skin: Negative for rash.  Neurological: Negative for headaches.  Psychiatric/Behavioral: Positive for confusion.     Physical Exam Updated Vital Signs BP 128/78   Pulse 89   Temp 100.1 F (37.8 C) (  Rectal)   Resp (!) 34   SpO2 98%   Physical Exam  Constitutional: He appears well-developed and well-nourished.  HENT:  Head: Normocephalic and atraumatic.  Eyes: Conjunctivae are normal.  Neck: Neck supple.  Cardiovascular: Normal rate and regular rhythm.  No murmur heard. Pulmonary/Chest: Effort normal and breath sounds normal. No respiratory distress.  Abdominal: Soft. There is no tenderness.  Musculoskeletal: He exhibits no edema.  Neurological: He is alert. He is disoriented.  A+O to person, place, and event, not to time  Skin: Skin is warm and dry.  Psychiatric: He has a normal mood and affect. He is slowed. Cognition and memory are impaired.  Nursing note and vitals reviewed.    ED Treatments / Results  Labs (all labs ordered are listed, but only abnormal results are displayed) Labs Reviewed - No data to display  EKG None  Radiology No results found.  Procedures Procedures (including critical care time)  Medications Ordered in ED Medications - No data to display   Initial Impression / Assessment and Plan / ED Course  I have reviewed the triage vital signs and the nursing notes.  Pertinent labs & imaging results that were available during my care of the patient were reviewed by me and considered in my medical decision making (see chart for details).     Patient is an 84 year old man with past medical history of A. fib on Eliquis, parkinsonism, and recent PNA who  presents with shortness of breath that began suddenly 4 hours ago. That patient had abdominal pain EMS report, however he denies this to me.  The patient has no abdominal tenderness.  The patient does report shortness of breath that is not positional or exertional.  CBC reveals mild leukocytosis without anemia.  CMP reveals elevated creatinine, but no significant electrolyte abnormalities.  Initial troponin 0.02.  Chest x-ray reveals pleural effusion and difficult to exclude superimposed pneumonia.  Urinalysis shows signs of cystitis.  EKG shows atrial fibrillation with RVR.  RVR resolved after patient given IV fluids and antibiotics and Tylenol.  Patient was not given 30 cc/kg of IV fluids as he has CHF and a pleural effusion. Patient admitted to medicine.  Patient care supervised by Dr. Darl Householder.  Irven Baltimore, MD   Final Clinical Impressions(s) / ED Diagnoses   Final diagnoses:  None    ED Discharge Orders    None       Irven Baltimore, MD 12/23/17 1440    Drenda Freeze, MD 12/23/17 [id number]

## 2017-12-22 NOTE — ED Notes (Signed)
Daughter: Peggye Form, [phone]

## 2017-12-22 NOTE — ED Notes (Signed)
ED Provider at bedside. 

## 2017-12-22 NOTE — H&P (Addendum)
History and Physical    Jason [name]:[id number] DOB: 1927/10/10 DOA: 12/22/2017  Referring MD/NP/PA:   PCP: [name], MD   Patient coming from:  The patient is coming from ALF.  At baseline, pt is dependent for most of ADL.   Chief Complaint: Cough, shortness of breath, fever, chills  HPI: Jason [name] is a 84 y.o. male with medical history significant of stroke, Parkinson's disease, hyperlipidemia, GERD, dCHF, PAF on Eliquis, CKD-3, remote bladder cancer (excision only), non-Hodgkin lymphoma (s/p of chemo and radiation therapy, not on chemo currently, following up with Dr. Beryle Beams), presents with cough, shortness of breath, fever and chills.  Patient states that he started having cough, shortness of breath, fever and chills yesterday.  He coughs up green-colored sputum.  Denies chest pain.  Per report, staff in ALF reported pt had nausea and abdominal pain, but the patient denies nausea and abdominal pain.  He states that he had mild diarrhea yesterday, which has resolved.  Patient is on Colace and Senokot.  Patient denies symptoms of UTI, unilateral weakness.  No tenderness in the calf area.  He has a temperature 100.1 by ED. I spoke to his daughter, who states that patient chokes sometimes when eating food. Of note, patient was diagnosed with pneumonia recently, he just finished course of doxycycline on 12/19/2017.  ED Course: pt was found to have WBC 13.6, lactic acid 1.64, worsening renal function, urinalysis (large amount of leukocyte, clear appearance, rare bacteria, WBC 10-20), temperature 100.1, heart rate 80s, tachypnea, oxygen saturation 93 to 95% on room air.  Chest x-ray showed mild CHF, cardiomegaly, left small to moderate pleural effusion with hazy opacity at left base.  Patient is admitted to telemetry bed as inpatient.  Review of Systems:   General: has fevers, chills, no body weight gain, has poor appetite, has fatigue HEENT: no blurry vision, hearing  changes or sore throat Respiratory: has dyspnea, coughing, no wheezing CV: no chest pain, no palpitations GI: no nausea, vomiting, abdominal pain, had diarrhea, no constipation GU: no dysuria, burning on urination, increased urinary frequency, hematuria  Ext: mild leg edema Neuro: no unilateral weakness, numbness, or tingling, no vision change or hearing loss Skin: no rash, no skin tear. MSK: No muscle spasm, no deformity, no limitation of range of movement in spin Heme: No easy bruising.  Travel history: No recent long distant travel.  Allergy: No Known Allergies  Past Medical History:  Diagnosis Date  . Arthritis   . Barrett esophagus   . Bladder cancer ([place]) dx'd 8119,1478   surg only  . CHF (congestive heart failure) ([place])   . Colon polyps    adenomatous  . Difficulty sleeping    OCCASIONALLY  . Diverticulosis of colon (without mention of hemorrhage)   . Frequency of urination   . GERD (gastroesophageal reflux disease)   . Hiatal hernia   . History of bladder cancer 1981   EXCISION ONLY  . History of skin cancer    BASAL CELL  . Hyperlipidemia   . Lymphoma, non-Hodgkin's ([place]) 2011   [place]   . Macular degeneration    LEFT EYE  . Multiple nodules of lung 09/16/2011  . PAF (paroxysmal atrial fibrillation) ([place])   . Personal history of colonic polyps 07/15/2010   tubular adenoma  . Prostate cancer [facility])    patient denies  . PUD (peptic ulcer disease)   . Stomach cancer ([place])    [facility] origin  . Stroke ([place])   .  Weakness     Past Surgical History:  Procedure Laterality Date  . APPENDECTOMY    . bone spur     left shoulder  . CHOLECYSTECTOMY    . KNEE ARTHROPLASTY Left 07/30/2015   Procedure: LEFT TOTAL KNEE ARTHROPLASTY WITH COMPUTER NAVIGATION;  Surgeon: Rod Can, MD;  Location: [facility] ORS;  Service: Orthopedics;  Laterality: Left;  . LASER OF PROSTATE W/ GREEN LIGHT PVP    . TENDON REPAIR     right arm  . TRIGGER FINGER RELEASE     bilateral hands    . WRIST SURGERY     Bilateral    Social History:  reports that he quit smoking about 51 years ago. He quit smokeless tobacco use about 19 years ago. He reports that he drinks alcohol. He reports that he does not use drugs.  Family History:  Family History  Problem Relation Age of Onset  . Heart attack Father   . Other Mother        "old age"     Prior to Admission medications   Medication Sig Start Date End Date Taking? Authorizing Provider  apixaban (ELIQUIS) 5 MG TABS tablet Take 5 mg by mouth 2 (two) times daily.   Yes [provider]  benzonatate (TESSALON) 100 MG capsule Take 100 mg by mouth every 8 (eight) hours as needed for cough. 10/17/16  Yes [provider]  carbidopa-levodopa (SINEMET IR) 25-100 MG tablet Take 1 tablet by mouth 3 (three) times daily. Take at 8am, 12noon, 5pm 01/12/17  Yes Marcial Pacas, MD  Docusate Sodium (COLACE PO) Take 100 mg by mouth at bedtime.    Yes [provider]  esomeprazole (NEXIUM) 40 MG capsule TAKE 1 CAPSULE (40 MG TOTAL) BY MOUTH 2 (TWO) TIMES DAILY BEFORE A MEAL. 03/17/16  Yes Irene Shipper, MD  ferrous sulfate 325 (65 FE) MG tablet Take 325 mg by mouth daily with breakfast.   Yes [provider]  fish oil-omega-3 fatty acids 1000 MG capsule Take 1 g by mouth 2 (two) times daily.    Yes [provider]  fluticasone (FLONASE) 50 MCG/ACT nasal spray Place 2 sprays into both nostrils daily.   Yes [provider]  furosemide (LASIX) 40 MG tablet Take 40 mg by mouth 2 (two) times daily.   Yes [provider]  loperamide (IMODIUM A-D) 2 MG tablet Take 4 mg by mouth 4 (four) times daily as needed for diarrhea or loose stools.   Yes [provider]  Melatonin 3 MG TABS Take 3 mg by mouth at bedtime.   Yes [provider]  metoprolol tartrate (LOPRESSOR) 25 MG tablet Take 25 mg by mouth 2 (two) times daily.   Yes [provider]  Multiple Vitamins-Minerals (ICAPS  MV PO) Take 1 capsule by mouth 2 (two) times daily.    Yes [provider]  Polyethyl Glycol-Propyl Glycol (SYSTANE) 0.4-0.3 % SOLN Place 1 drop into both eyes daily as needed (for dry eyes).    Yes [provider]  senna (SENOKOT) 8.6 MG tablet Take 1 tablet by mouth daily.   Yes [provider]  senna-docusate (SENOKOT-S) 8.6-50 MG tablet Take 1 tablet by mouth daily as needed for mild constipation.   Yes [provider]  simvastatin (ZOCOR) 20 MG tablet Take 20 mg by mouth every evening.    Yes [provider]  Specialty Vitamins Products (ICAPS LUTEIN-ZEAXANTHIN PO) Take 1 tablet by mouth 2 (two) times daily.  Yes [provider]  tamsulosin (FLOMAX) 0.4 MG CAPS capsule Take 0.4 mg by mouth at bedtime.   Yes [provider]  traMADol (ULTRAM) 50 MG tablet Take 1 tablet (50 mg total) by mouth every 6 (six) hours. Patient taking differently: Take 50 mg by mouth every 8 (eight) hours as needed for moderate pain.  10/28/16  Yes Meuth, Brooke A, PA-C  carbidopa-levodopa (SINEMET IR) 25-100 MG tablet Take 1 tablet by mouth 3 (three) times daily. Patient not taking: Reported on 12/22/2017 09/05/16   Marcial Pacas, MD  doxycycline (ADOXA) 100 MG tablet Take 100 mg by mouth 2 (two) times daily. x7 days. 12/07/17   [provider]    Physical Exam: Vitals:   12/22/17 1930 12/22/17 1945 12/22/17 2000 12/22/17 2030  BP: (!) 109/57 96/62 (!) 104/59 109/61  Pulse: 83 84 89 90  Resp: (!) 24 (!) 28 (!) 26 (!) 27  Temp:      TempSrc:      SpO2: 92% 92% 92% 93%   General: Not in acute distress HEENT:       Eyes: PERRL, EOMI, no scleral icterus.       ENT: No discharge from the ears and nose, no pharynx injection, no tonsillar enlargement.        Neck: No JVD, no bruit, no mass felt. Heme: No neck lymph node enlargement. Cardiac: S1/S2, RRR, No murmurs, No gallops or rubs. Respiratory: No rales, wheezing, rhonchi or rubs. GI: Soft,  nondistended, nontender, no rebound pain, no organomegaly, BS present. GU: No hematuria Ext: trace leg edema bilaterally. 2+DP/PT pulse bilaterally. Musculoskeletal: No joint deformities, No joint redness or warmth, no limitation of ROM in spin. Skin: No rashes.  Neuro: Alert, oriented X3, cranial nerves II-XII grossly intact, moves all extremities. Psych: Patient is not psychotic, no suicidal or hemocidal ideation.  Labs on Admission: I have personally reviewed following labs and imaging studies  CBC: Recent Labs  Lab 12/22/17 1653  WBC 13.7*  HGB 13.3  HCT 43.9  MCV 88.2  PLT 086   Basic Metabolic Panel: Recent Labs  Lab 12/22/17 1653  NA 141  K 4.2  CL 101  CO2 26  GLUCOSE 103*  BUN 19  CREATININE 1.59*  CALCIUM 8.7*   GFR: CrCl cannot be calculated (Unknown ideal weight.). Liver Function Tests: Recent Labs  Lab 12/22/17 1653  AST 21  ALT 6  ALKPHOS 35*  BILITOT 0.8  PROT 8.4*  ALBUMIN 2.9*   No results for input(s): LIPASE, AMYLASE in the last 168 hours. No results for input(s): AMMONIA in the last 168 hours. Coagulation Profile: No results for input(s): INR, PROTIME in the last 168 hours. Cardiac Enzymes: No results for input(s): CKTOTAL, CKMB, CKMBINDEX, TROPONINI in the last 168 hours. BNP (last 3 results) No results for input(s): PROBNP in the last 8760 hours. HbA1C: No results for input(s): HGBA1C in the last 72 hours. CBG: No results for input(s): GLUCAP in the last 168 hours. Lipid Profile: No results for input(s): CHOL, HDL, LDLCALC, TRIG, CHOLHDL, LDLDIRECT in the last 72 hours. Thyroid Function Tests: No results for input(s): TSH, T4TOTAL, FREET4, T3FREE, THYROIDAB in the last 72 hours. Anemia Panel: No results for input(s): VITAMINB12, FOLATE, FERRITIN, TIBC, IRON, RETICCTPCT in the last 72 hours. Urine analysis:    Component Value Date/Time   COLORURINE YELLOW 12/22/2017 [place] 12/22/2017 1735   LABSPEC 1.012  12/22/2017 1735   PHURINE 6.0 12/22/2017 1735   GLUCOSEU NEGATIVE  12/22/2017 1735   HGBUR NEGATIVE 12/22/2017 1735   BILIRUBINUR NEGATIVE 12/22/2017 1735   KETONESUR NEGATIVE 12/22/2017 1735   PROTEINUR NEGATIVE 12/22/2017 1735   UROBILINOGEN 1.0 07/07/2011 1942   NITRITE NEGATIVE 12/22/2017 1735   LEUKOCYTESUR LARGE (A) 12/22/2017 1735   Sepsis Labs: @LABRCNTIP (procalcitonin:4,lacticidven:4) )No results found for this or any previous visit (from the past 240 hour(s)).   Radiological Exams on Admission: Dg Chest 2 View  Result Date: 12/22/2017 CLINICAL DATA:  Abdominal pain and nausea.  Dyspnea. EXAM: CHEST - 2 VIEW COMPARISON:  12/07/2017 FINDINGS: The patient's chin obscures the left lung apex. Stable cardiomegaly with interstitial edema. Hazy opacity at the left lung base compatible with a layering left small to moderate effusion similar in appearance to prior. Superimposed pneumonia would be difficult to entirely exclude. Probable adjacent atelectasis at the left lung base. Degenerative changes are present about both shoulders and dorsal spine. IMPRESSION: 1. Cardiomegaly with mild CHF. 2. Small to moderate layering left effusion is believed to account for the hazy opacity at the left lung base. Superimposed pneumonia and/or atelectasis would be difficult to entirely exclude. Electronically Signed   By: Ashley Royalty M.D.   On: 12/22/2017 18:52     EKG: Independently reviewed.  Atrial fibrillation, QTc 476, LAD, poor R wave progression   Assessment/Plan Principal Problem:   HCAP (healthcare-associated pneumonia) Active Problems:   Hyperlipidemia   GERD (gastroesophageal reflux disease)   PAF (paroxysmal atrial fibrillation) (HCC)   Acute renal failure superimposed on stage 3 chronic kidney disease (HCC)   CVA (cerebral vascular accident) ([place])   Parkinsonism ([place])   Sepsis (HCC)   Chronic diastolic CHF (congestive heart failure) ([place])   Aspiration pneumonia ([place])    Diarrhea   Sepsis due to HCAP vs. aspiration pneumonia: pt meets criteria for sepsis with leukocytosis, fever, tachypnea.  Lactic acid is normal.  Currently hemodynamically stable.  Patient has history of stroke and Parkinson's disease, choking sometimes, indicating possible aspiration pneumonia. He just finished course of doxycycline for pneumonia in facility on 12/19/2017, obviously failed outpatient treatment with oral antibiotics.  - will admit to tele bed as inpt - IV Vancomycin, Zosyn (patient received 1 dose of cefepime by ED) - Mucinex for cough  - prn Albuterol Nebs for SOB - Urine legionella and S. pneumococcal antigen - Follow up blood culture x2, sputum culture and respiratory virus panel - will get Procalcitonin and trend lactic acid level per sepsis protocol - IVF: NS at 75 mL per hour of NS (patient has a diastolic congestive heart failure, limiting aggressive IV fluids treatment) - SLP  Hyperlipidemia: -zocor  GERD: -Protonix  Atrial Fibrillation: CHA2DS2-VASc Score is 5, needs oral anticoagulation. Patient is on Eliquis at home. Heart rate is well controlled. -Continue metoprolol and Eliquis   Acute renal failure superimposed on stage 3 chronic kidney disease ([place]): Baseline creatinine 1.0-1.2.  His creatinine is 1.59, BUN 19.  Likely due to dehydration and continuation of Lasix. -Hold Lasix -Follow-up renal function by BMP -IV fluid as above  CVA (cerebral vascular accident) [facility]): -in Eliquis and zocor  Parkinsonism ([place]): -Continue Sinemet  Chronic diastolic CHF (congestive heart failure) ([place]): 2D echo on 06/17/2016 showed EF 55 to 60% with grade 1 diastolic dysfunction.  Patient has trace leg edema, but no JVD.  Does not seem to have CHF exacerbation. - Hold Lasix due to worsening renal function and sepsis -Check BMP  Diarrhea: Had diarrhea yesterday.  Staff in facility reported that the patient had nausea  of abdominal pain, but the patient denies the  symptoms.  Currently patient does not have nausea, vomiting, diarrhea or abdominal pain.  Diarrhea is likely due to Colace and Senokot use. -Hold Colace and Senokot     Inpatient status:  # Patient requires inpatient status due to high intensity of service, high risk for further deterioration and high frequency of surveillance required.  I certify that at the point of admission it is my clinical judgment that the patient will require inpatient hospital care spanning beyond 2 midnights from the point of admission.  . This patient has multiple chronic comorbidities including stroke, Parkinson's disease, hyperlipidemia, GERD, dCHF, PAF on Eliquis, CKD-3, remote bladder cancer (excision only), non-Hodgkin lymphoma. . Now patient has presenting symptoms include cough, shortness of breath, fever and chills. . The worrisome physical exam findings include tachypnea, fever . The initial radiographic and laboratory data are worrisome because of left side pleural effusion, hazy opacity in left lower base on CXR, sepsis with leukocytosis, fever and tachypnea. . Current medical needs: please see my assessment and plan    DVT ppx: on Eliquis Code Status: Full code per her daughter who is POA Family Communication: I spoke with his daughter on the phone Disposition Plan:  Anticipate discharge back to previous ALF Consults called:  none Admission status: Inpatient/tele      Date of Service 12/22/2017    Ivor Costa [facility] Pager [phone]  If 7PM-7AM, please contact night-coverage [email or web address] Password [email or web address] 12/22/2017, 9:00 PM

## 2017-12-22 NOTE — ED Triage Notes (Signed)
Patient arrived from [facility] via EMS, staff reports of abdominal pain and nausea. Patient was given zofran by EMS. Patient is A&O X 4. When asked why he is here he states "shortness of breath"

## 2017-12-23 ENCOUNTER — Other Ambulatory Visit: Payer: Self-pay

## 2017-12-23 ENCOUNTER — Encounter (HOSPITAL_COMMUNITY): Payer: Self-pay | Admitting: *Deleted

## 2017-12-23 DIAGNOSIS — I6300 Cerebral infarction due to thrombosis of unspecified precerebral artery: Secondary | ICD-10-CM

## 2017-12-23 DIAGNOSIS — Z87891 Personal history of nicotine dependence: Secondary | ICD-10-CM

## 2017-12-23 DIAGNOSIS — R7881 Bacteremia: Secondary | ICD-10-CM

## 2017-12-23 DIAGNOSIS — B952 Enterococcus as the cause of diseases classified elsewhere: Secondary | ICD-10-CM

## 2017-12-23 DIAGNOSIS — J189 Pneumonia, unspecified organism: Secondary | ICD-10-CM

## 2017-12-23 LAB — BLOOD CULTURE ID PANEL (REFLEXED)
ACINETOBACTER BAUMANNII: NOT DETECTED
CANDIDA ALBICANS: NOT DETECTED
CANDIDA GLABRATA: NOT DETECTED
CANDIDA PARAPSILOSIS: NOT DETECTED
Candida krusei: NOT DETECTED
Candida tropicalis: NOT DETECTED
ENTEROBACTER CLOACAE COMPLEX: NOT DETECTED
ENTEROBACTERIACEAE SPECIES: NOT DETECTED
Enterococcus species: DETECTED — AB
Escherichia coli: NOT DETECTED
Haemophilus influenzae: NOT DETECTED
KLEBSIELLA OXYTOCA: NOT DETECTED
KLEBSIELLA PNEUMONIAE: NOT DETECTED
Listeria monocytogenes: NOT DETECTED
NEISSERIA MENINGITIDIS: NOT DETECTED
PSEUDOMONAS AERUGINOSA: NOT DETECTED
Proteus species: NOT DETECTED
STAPHYLOCOCCUS AUREUS BCID: NOT DETECTED
STREPTOCOCCUS PNEUMONIAE: NOT DETECTED
STREPTOCOCCUS PYOGENES: NOT DETECTED
STREPTOCOCCUS SPECIES: NOT DETECTED
Serratia marcescens: NOT DETECTED
Staphylococcus species: NOT DETECTED
Streptococcus agalactiae: NOT DETECTED
Vancomycin resistance: NOT DETECTED

## 2017-12-23 LAB — BRAIN NATRIURETIC PEPTIDE: B Natriuretic Peptide: 443.8 pg/mL — ABNORMAL HIGH (ref 0.0–100.0)

## 2017-12-23 LAB — PROCALCITONIN: Procalcitonin: 1.3 ng/mL

## 2017-12-23 LAB — LACTIC ACID, PLASMA

## 2017-12-23 MED ORDER — SODIUM CHLORIDE 0.9 % IV SOLN
3.0000 g | Freq: Three times a day (TID) | INTRAVENOUS | Status: AC
  Administered 2017-12-23 – 2017-12-25 (×8): 3 g via INTRAVENOUS
  Filled 2017-12-23 (×8): qty 3

## 2017-12-23 MED ORDER — SODIUM CHLORIDE 0.9 % IV SOLN: INTRAVENOUS | Status: AC

## 2017-12-23 NOTE — Progress Notes (Signed)
PROGRESS NOTE    DERRY KASSEL  JOA:[id number] DOB: Mar 14, 1928 DOA: 12/22/2017 PCP: [name], MD  Brief NarrativeJimmy RUSTY [name] is a 84 y.o. male with medical history significant of stroke, Parkinson's disease, hyperlipidemia, GERD, dCHF, PAF on Eliquis, CKD-3, remote bladder cancer (excision only), non-Hodgkin lymphoma (s/p of chemo and radiation therapy, not on chemo currently, following up with Dr. Beryle Beams), presents with cough, shortness of breath, fever and chills, found to have aspiration pneumonia, started antibiotics   Assessment & Plan:     Aspiration pneumonia -clinically improving, stop IV vancomycin -blood cultures with enterococcus -Transitioned to IV Unasyn today -Repeat blood cultures -SLP evaluation -Cut down and stop IV fluids   Paroxysmal atrial fibrillation -In sinus rhythm at this time, -Continue metoprolol and anticoagulation with Eliquis   history of CVA -Continue eliquis and statin   History of Parkinson's disease -Continue Sinemet   Chronic diastolic CHF - Last echo with preserved ejection fraction, -Clinically appears euvolemic now cut down and stop fluids -Resume diuretics and once 2 days   Mild acute kidney injury -due to above illness, sepsis and diuretics    DVT prophylaxis: ELiquis Code Status: FUll Code Family Communication:  no family at bedside Disposition Plan: back to ALF when stable      Procedures:   Antimicrobials:    Subjective: -breathing better, reports chronic intermittent cough with swallowing  Objective: Vitals:   12/22/17 2100 12/23/17 0655 12/23/17 0814 12/23/17 1200  BP: 102/61  101/62 (!) 100/59  Pulse:   74   Resp: (!) 24  20   Temp: 98.1 F (36.7 C)  97.6 F (36.4 C) 97.8 F (36.6 C)  TempSrc: Oral  Oral Oral  SpO2:   97%   Weight: 94.8 kg 93.8 kg    Height: 5\' 10"  (1.778 m) 5\' 10"  (1.778 m)     No intake or output data in the 24 hours ending 12/23/17 1257 Filed Weights   12/22/17  2100 12/23/17 0655  Weight: 94.8 kg 93.8 kg    Examination:  General exam: Appears calm and comfortable  Respiratory system: ronchi at L base Cardiovascular system: S1 & S2 heard, RRR. Gastrointestinal system: Abdomen is nondistended, soft and nontender.Normal bowel sounds heard. Central nervous system: Alert and oriented. No focal neurological deficits. Extremities: no edema Skin: No rashes, lesions or ulcers Psychiatry: Mood & affect appropriate.     Data Reviewed:   CBC: Recent Labs  Lab 12/22/17 1653  WBC 13.7*  HGB 13.3  HCT 43.9  MCV 88.2  PLT 601   Basic Metabolic Panel: Recent Labs  Lab 12/22/17 1653  NA 141  K 4.2  CL 101  CO2 26  GLUCOSE 103*  BUN 19  CREATININE 1.59*  CALCIUM 8.7*   GFR: Estimated Creatinine Clearance: 37.6 mL/min (A) (by C-G formula based on SCr of 1.59 mg/dL (H)). Liver Function Tests: Recent Labs  Lab 12/22/17 1653  AST 21  ALT 6  ALKPHOS 35*  BILITOT 0.8  PROT 8.4*  ALBUMIN 2.9*   No results for input(s): LIPASE, AMYLASE in the last 168 hours. No results for input(s): AMMONIA in the last 168 hours. Coagulation Profile: No results for input(s): INR, PROTIME in the last 168 hours. Cardiac Enzymes: No results for input(s): CKTOTAL, CKMB, CKMBINDEX, TROPONINI in the last 168 hours. BNP (last 3 results) No results for input(s): PROBNP in the last 8760 hours. HbA1C: No results for input(s): HGBA1C in the last 72 hours. CBG: No results for input(s): GLUCAP in  the last 168 hours. Lipid Profile: No results for input(s): CHOL, HDL, LDLCALC, TRIG, CHOLHDL, LDLDIRECT in the last 72 hours. Thyroid Function Tests: No results for input(s): TSH, T4TOTAL, FREET4, T3FREE, THYROIDAB in the last 72 hours. Anemia Panel: No results for input(s): VITAMINB12, FOLATE, FERRITIN, TIBC, IRON, RETICCTPCT in the last 72 hours. Urine analysis:    Component Value Date/Time   COLORURINE YELLOW 12/22/2017 [place] 12/22/2017  1735   LABSPEC 1.012 12/22/2017 1735   PHURINE 6.0 12/22/2017 1735   GLUCOSEU NEGATIVE 12/22/2017 1735   HGBUR NEGATIVE 12/22/2017 1735   BILIRUBINUR NEGATIVE 12/22/2017 1735   KETONESUR NEGATIVE 12/22/2017 1735   PROTEINUR NEGATIVE 12/22/2017 1735   UROBILINOGEN 1.0 07/07/2011 1942   NITRITE NEGATIVE 12/22/2017 1735   LEUKOCYTESUR LARGE (A) 12/22/2017 1735   Sepsis Labs: @LABRCNTIP (procalcitonin:4,lacticidven:4)  ) Recent Results (from the past 240 hour(s))  Blood culture (routine x 2)     Status: None (Preliminary result)   Collection Time: 12/22/17  4:53 PM  Result Value Ref Range Status   Specimen Description BLOOD RIGHT ANTECUBITAL  Final   Special Requests   Final    BOTTLES DRAWN AEROBIC AND ANAEROBIC Blood Culture adequate volume   Culture  Setup Time   Final    GRAM POSITIVE COCCI IN CHAINS IN BOTH AEROBIC AND ANAEROBIC BOTTLES Organism ID to follow CRITICAL RESULT CALLED TO, READ BACK BY AND VERIFIED WITH: RJiles Crocker PHARMD, AT 1116 12/23/17 BY D. VANHOOK Performed at [facility], [place] [address] [place], [place] [address]    Culture GRAM POSITIVE COCCI  Final   Report Status PENDING  Incomplete  Blood Culture ID Panel (Reflexed)     Status: Abnormal   Collection Time: 12/22/17  4:53 PM  Result Value Ref Range Status   Enterococcus species DETECTED (A) NOT DETECTED Final    Comment: CRITICAL RESULT CALLED TO, READ BACK BY AND VERIFIED WITH: R. RUMBARGER PHARMD, AT 1116 12/23/17 BY D. VANHOOK    Vancomycin resistance NOT DETECTED NOT DETECTED Final   Listeria monocytogenes NOT DETECTED NOT DETECTED Final   Staphylococcus species NOT DETECTED NOT DETECTED Final   Staphylococcus aureus NOT DETECTED NOT DETECTED Final   Streptococcus species NOT DETECTED NOT DETECTED Final   Streptococcus agalactiae NOT DETECTED NOT DETECTED Final   Streptococcus pneumoniae NOT DETECTED NOT DETECTED Final   Streptococcus pyogenes NOT DETECTED NOT DETECTED Final    Acinetobacter baumannii NOT DETECTED NOT DETECTED Final   Enterobacteriaceae species NOT DETECTED NOT DETECTED Final   Enterobacter cloacae complex NOT DETECTED NOT DETECTED Final   Escherichia coli NOT DETECTED NOT DETECTED Final   Klebsiella oxytoca NOT DETECTED NOT DETECTED Final   Klebsiella pneumoniae NOT DETECTED NOT DETECTED Final   Proteus species NOT DETECTED NOT DETECTED Final   Serratia marcescens NOT DETECTED NOT DETECTED Final   Haemophilus influenzae NOT DETECTED NOT DETECTED Final   Neisseria meningitidis NOT DETECTED NOT DETECTED Final   Pseudomonas aeruginosa NOT DETECTED NOT DETECTED Final   Candida albicans NOT DETECTED NOT DETECTED Final   Candida glabrata NOT DETECTED NOT DETECTED Final   Candida krusei NOT DETECTED NOT DETECTED Final   Candida parapsilosis NOT DETECTED NOT DETECTED Final   Candida tropicalis NOT DETECTED NOT DETECTED Final    Comment: Performed at [facility], [address], [place], [place] [address]  Blood culture (routine x 2)     Status: None (Preliminary result)   Collection Time: 12/22/17  5:11 PM  Result  Value Ref Range Status   Specimen Description BLOOD RIGHT HAND  Final   Special Requests   Final    BOTTLES DRAWN AEROBIC AND ANAEROBIC Blood Culture adequate volume   Culture   Final    NO GROWTH < 24 HOURS Performed at [facility], [address], [place], [place] [address]    Report Status PENDING  Incomplete  Respiratory Panel by PCR     Status: None   Collection Time: 12/22/17  9:33 PM  Result Value Ref Range Status   Adenovirus NOT DETECTED NOT DETECTED Final   Coronavirus 229E NOT DETECTED NOT DETECTED Final   Coronavirus HKU1 NOT DETECTED NOT DETECTED Final   Coronavirus NL63 NOT DETECTED NOT DETECTED Final   Coronavirus OC43 NOT DETECTED NOT DETECTED Final   Metapneumovirus NOT DETECTED NOT DETECTED Final   Rhinovirus / Enterovirus NOT DETECTED NOT DETECTED Final   Influenza A NOT DETECTED NOT DETECTED Final     Influenza B NOT DETECTED NOT DETECTED Final   Parainfluenza Virus 1 NOT DETECTED NOT DETECTED Final   Parainfluenza Virus 2 NOT DETECTED NOT DETECTED Final   Parainfluenza Virus 3 NOT DETECTED NOT DETECTED Final   Parainfluenza Virus 4 NOT DETECTED NOT DETECTED Final   Respiratory Syncytial Virus NOT DETECTED NOT DETECTED Final   Bordetella pertussis NOT DETECTED NOT DETECTED Final   Chlamydophila pneumoniae NOT DETECTED NOT DETECTED Final   Mycoplasma pneumoniae NOT DETECTED NOT DETECTED Final    Comment: Performed at [facility], [place] [address], [place], [place] [address]  MRSA PCR Screening     Status: None   Collection Time: 12/22/17  9:33 PM  Result Value Ref Range Status   MRSA by PCR NEGATIVE NEGATIVE Final    Comment:        The GeneXpert MRSA Assay (FDA approved for NASAL specimens only), is one component of a comprehensive MRSA colonization surveillance program. It is not intended to diagnose MRSA infection nor to guide or monitor treatment for MRSA infections. Performed at [facility], [place] [address], [place], [place] [address]          Radiology Studies: Dg Chest 2 View  Result Date: 12/22/2017 CLINICAL DATA:  Abdominal pain and nausea.  Dyspnea. EXAM: CHEST - 2 VIEW COMPARISON:  12/07/2017 FINDINGS: The patient's chin obscures the left lung apex. Stable cardiomegaly with interstitial edema. Hazy opacity at the left lung base compatible with a layering left small to moderate effusion similar in appearance to prior. Superimposed pneumonia would be difficult to entirely exclude. Probable adjacent atelectasis at the left lung base. Degenerative changes are present about both shoulders and dorsal spine. IMPRESSION: 1. Cardiomegaly with mild CHF. 2. Small to moderate layering left effusion is believed to account for the hazy opacity at the left lung base. Superimposed pneumonia and/or atelectasis would be difficult to entirely exclude. Electronically Signed    By: Ashley Royalty M.D.   On: 12/22/2017 18:52        Scheduled Meds: . apixaban  2.5 mg Oral BID  . carbidopa-levodopa  1 tablet Oral TID  . ferrous sulfate  325 mg Oral Q breakfast  . fluticasone  2 spray Each Nare Daily  . Melatonin  3 mg Oral QHS  . metoprolol tartrate  25 mg Oral BID  . multivitamin  1 tablet Oral Daily  . omega-3 acid ethyl esters  1 g Oral BID  . pantoprazole  80 mg Oral Q1200  . simvastatin  20 mg Oral QPM  . tamsulosin  0.4 mg Oral QHS   Continuous Infusions: . sodium chloride    . ampicillin-sulbactam (UNASYN) IV       LOS: 1 day    Time spent: 26min    Domenic Polite, MD [facility] Page via [email or web address], password [email or web address] After 7PM please contact night-coverage  12/23/2017, 12:57 PM

## 2017-12-23 NOTE — Progress Notes (Signed)
PHARMACY - PHYSICIAN COMMUNICATION CRITICAL VALUE ALERT - BLOOD CULTURE IDENTIFICATION (BCID)  Jason [name] is an 84 y.o. male who presented to [facility] on 12/22/2017 with a chief complaint of cough, SOB  Name of physician (or Provider) Contacted: Domenic Polite  Current antibiotics: vancomycin and zosyn  Changes to prescribed antibiotics recommended:  Changing to unasyn to cover bacteremia and possible aspiration pneumonia  Results for orders placed or performed during the hospital encounter of 12/22/17  Blood Culture ID Panel (Reflexed) (Collected: 12/22/2017  4:53 PM)  Result Value Ref Range   Enterococcus species DETECTED (A) NOT DETECTED   Vancomycin resistance NOT DETECTED NOT DETECTED   Listeria monocytogenes NOT DETECTED NOT DETECTED   Staphylococcus species NOT DETECTED NOT DETECTED   Staphylococcus aureus NOT DETECTED NOT DETECTED   Streptococcus species NOT DETECTED NOT DETECTED   Streptococcus agalactiae NOT DETECTED NOT DETECTED   Streptococcus pneumoniae NOT DETECTED NOT DETECTED   Streptococcus pyogenes NOT DETECTED NOT DETECTED   Acinetobacter baumannii NOT DETECTED NOT DETECTED   Enterobacteriaceae species NOT DETECTED NOT DETECTED   Enterobacter cloacae complex NOT DETECTED NOT DETECTED   Escherichia coli NOT DETECTED NOT DETECTED   Klebsiella oxytoca NOT DETECTED NOT DETECTED   Klebsiella pneumoniae NOT DETECTED NOT DETECTED   Proteus species NOT DETECTED NOT DETECTED   Serratia marcescens NOT DETECTED NOT DETECTED   Haemophilus influenzae NOT DETECTED NOT DETECTED   Neisseria meningitidis NOT DETECTED NOT DETECTED   Pseudomonas aeruginosa NOT DETECTED NOT DETECTED   Candida albicans NOT DETECTED NOT DETECTED   Candida glabrata NOT DETECTED NOT DETECTED   Candida krusei NOT DETECTED NOT DETECTED   Candida parapsilosis NOT DETECTED NOT DETECTED   Candida tropicalis NOT DETECTED NOT DETECTED    [name], Rande Lawman 12/23/2017  11:23 AM

## 2017-12-23 NOTE — Plan of Care (Signed)
Discussed plan of care with patient. Explained the importance of using the call light when assistance is needed.  Patient is calm and cooperative.  No complaints of pain.  Good teach back displayed.

## 2017-12-23 NOTE — Evaluation (Signed)
Clinical/Bedside Swallow Evaluation Patient Details  Name: Jason [name] MRN: [id number] Date of Birth: Jul 28, 1927  Today's Date: 12/23/2017 Time: SLP Start Time (ACUTE ONLY): 1438 SLP Stop Time (ACUTE ONLY): 1503 SLP Time Calculation (min) (ACUTE ONLY): 25 min  Past Medical History:  Past Medical History:  Diagnosis Date  . Arthritis   . Barrett esophagus   . Bladder cancer ([place]) dx'd 4665,9935   surg only  . CHF (congestive heart failure) ([place])   . Colon polyps    adenomatous  . Difficulty sleeping    OCCASIONALLY  . Diverticulosis of colon (without mention of hemorrhage)   . Frequency of urination   . GERD (gastroesophageal reflux disease)   . Hiatal hernia   . History of bladder cancer 1981   EXCISION ONLY  . History of skin cancer    BASAL CELL  . Hyperlipidemia   . Lymphoma, non-Hodgkin's ([place]) 2011   [place]   . Macular degeneration    LEFT EYE  . Multiple nodules of lung 09/16/2011  . PAF (paroxysmal atrial fibrillation) ([place])   . Personal history of colonic polyps 07/15/2010   tubular adenoma  . Prostate cancer [facility])    patient denies  . PUD (peptic ulcer disease)   . Stomach cancer ([place])    [employer] origin  . Stroke ([place])   . Weakness    Past Surgical History:  Past Surgical History:  Procedure Laterality Date  . APPENDECTOMY    . bone spur     left shoulder  . CHOLECYSTECTOMY    . KNEE ARTHROPLASTY Left 07/30/2015   Procedure: LEFT TOTAL KNEE ARTHROPLASTY WITH COMPUTER NAVIGATION;  Surgeon: Rod Can, MD;  Location: [facility] ORS;  Service: Orthopedics;  Laterality: Left;  . LASER OF PROSTATE W/ GREEN LIGHT PVP    . TENDON REPAIR     right arm  . TRIGGER FINGER RELEASE     bilateral hands  . WRIST SURGERY     Bilateral   HPI:  84 y.o. male with medical history significant of stroke, Parkinson's disease, hyperlipidemia, GERD, dCHF, PAF on Eliquis, CKD-3, remote bladder cancer (excision only), non-Hodgkin lymphoma (s/p of chemo and radiation  therapy, not on chemo currently, following up with Dr. Beryle Beams), presents with cough, shortness of breath, fever and chills CXR on 12/22/17 indicated Small to moderate layering left effusion is believed to account for the hazy opacity at the left lung base. Superimposed pneumonia and/or atelectasis would be difficult to entirely exclude; MBS completed on 10/29/16 indicated Pt continues to present with dealyed swallow initiation, weak base of tongue retraction, and silent penetration and aspiration events during and after the swallow. Aspirates increase with secretions and are eventually sensed at they descend the trachea, expectorated with hard coughing. Pt is intermittnetly able to achieve an effective preventative throat clear or cough, but not consistently. Best methods to reduce penetration and pharyngeal residuals were cues for a deep chin tuck, chin squeezed to the chest, with hard effortful swallows x2; since this time, pt participated in extensive ST which improved his swallowing function and was progressed to regular/thin liquids.   Assessment / Plan / Recommendation Clinical Impression   Pt presents with oropharyngeal dysphagia characterized by overt s/s of aspiration including immediate cough with larger volumes of liquids (via straw) and delayed cough intermittently with solids; pt decreased and/or eliminated s/s of aspiration with slower rate, small bites/sips, and chin tuck.  Effortful swallow also initiated with thin via small cup sips and symptoms lessened and/or  were eliminated; OME revealed generalized weakness overall; discussed f/u MBS with family d/t >1 yr since last study and family declined at this time; concern for f/u with swallowing strategies d/t pt's cognitive deficits, but ST will f/u for diet tolerance/education re: importance of completion of specific swallowing strategies to minimize aspiration risk during all PO intake. SLP Visit Diagnosis: Dysphagia, oropharyngeal phase  (R13.12)    Aspiration Risk  Moderate aspiration risk;Mild aspiration risk    Diet Recommendation     Medication Administration: Whole meds with puree    Other  Recommendations Oral Care Recommendations: Oral care BID   Follow up Recommendations Other (comment)(ALF)      Frequency and Duration min 2x/week  1 week       Prognosis Prognosis for Safe Diet Advancement: Good Barriers to Reach Goals: Cognitive deficits      Swallow Study   General Date of Onset: 12/22/17 HPI: 84 y.o. male with medical history significant of stroke, Parkinson's disease, hyperlipidemia, GERD, dCHF, PAF on Eliquis, CKD-3, remote bladder cancer (excision only), non-Hodgkin lymphoma (s/p of chemo and radiation therapy, not on chemo currently, following up with Dr. Beryle Beams), presents with cough, shortness of breath, fever and chills Type of Study: Bedside Swallow Evaluation Previous Swallow Assessment: (7/19: MBS indicated HTL/D3; has had rehab since this MBS) Diet Prior to this Study: Dysphagia 3 (soft);Nectar-thick liquids Temperature Spikes Noted: No Respiratory Status: Room air History of Recent Intubation: No Behavior/Cognition: Alert;Cooperative;Confused;Requires cueing Oral Cavity Assessment: Within Functional Limits Oral Care Completed by SLP: No Oral Cavity - Dentition: Adequate natural dentition Vision: Functional for self-feeding Self-Feeding Abilities: Able to feed self;Needs assist Patient Positioning: Upright in bed Baseline Vocal Quality: Low vocal intensity Volitional Cough: Strong Volitional Swallow: Able to elicit    Oral/Motor/Sensory Function Overall Oral Motor/Sensory Function: Generalized oral weakness Facial Symmetry: Within Functional Limits Lingual ROM: Within Functional Limits Lingual Symmetry: Within Functional Limits Lingual Strength: Reduced   Ice Chips Ice chips: Within functional limits Presentation: Spoon   Thin Liquid Thin Liquid: Impaired Presentation:  Cup;Straw Pharyngeal  Phase Impairments: Suspected delayed Swallow;Multiple swallows;Cough - Immediate;Cough - Delayed    Nectar Thick Nectar Thick Liquid: Not tested   Honey Thick Honey Thick Liquid: Not tested   Puree Puree: Within functional limits Presentation: Self Fed   Solid     Solid: Within functional limits Presentation: Self Fed      Elvina Sidle, M.S., CCC-SLP 12/23/2017,4:41 PM

## 2017-12-23 NOTE — Consult Note (Signed)
Patient with 1/2 blood cultures with Enterococcus.  Antibiotics have been changed to appropriate therapy with ampicillin/sulbactam.  Presentation more c/w aspiration pneumonia, covered by current antibiotics.  I will continue to monitor cultures.  Full consult to follow.  No additions at this time.  Thayer Headings, MD

## 2017-12-23 NOTE — Consult Note (Signed)
[facility] for Infectious Disease       Reason for Consult: Enterococcus positive blood culture    Referring Physician: CHAMP autoconsult  Principal Problem:   HCAP (healthcare-associated pneumonia) Active Problems:   Hyperlipidemia   GERD (gastroesophageal reflux disease)   PAF (paroxysmal atrial fibrillation) (HCC)   Acute renal failure superimposed on stage 3 chronic kidney disease (HCC)   CVA (cerebral vascular accident) ([place])   Parkinsonism ([place])   Sepsis ([place])   Chronic diastolic CHF (congestive heart failure) ([place])   Aspiration pneumonia ([place])   Diarrhea   . apixaban  2.5 mg Oral BID  . carbidopa-levodopa  1 tablet Oral TID  . ferrous sulfate  325 mg Oral Q breakfast  . fluticasone  2 spray Each Nare Daily  . Melatonin  3 mg Oral QHS  . metoprolol tartrate  25 mg Oral BID  . multivitamin  1 tablet Oral Daily  . omega-3 acid ethyl esters  1 g Oral BID  . pantoprazole  80 mg Oral Q1200  . simvastatin  20 mg Oral QPM  . tamsulosin  0.4 mg Oral QHS    Recommendations: Continue Unasyn  Repeat blood cultures  Assessment: He has 1/2 blood cultures with Enterococcus. In this setting with no urinary complaints, no diarrhea and symptoms concerning for pneumonia, I doubt the significance of this.  Will continue to watch the cultures and if repeat cultures or the second one grow, will consider endocarditis evaluation.    Will continue to monitor cultures.   Antibiotics: Unasyn  HPI: Jason [name] is a 84 y.o. male with history of aspiration and poor swallowing here with cough, productive sputum, fever, leukocytosis concerning for aspiration pneumonia.  Feels better since admission.  No associated dysuria, rash, diarrhea.  No other concerns.  Daughter and son-in-law at bedside.     Review of Systems:  Constitutional: negative for chills and anorexia Gastrointestinal: negative for nausea and diarrhea Musculoskeletal: negative for myalgias and arthralgias All  other systems reviewed and are negative    Past Medical History:  Diagnosis Date  . Arthritis   . Barrett esophagus   . Bladder cancer ([place]) dx'd 4801,6553   surg only  . CHF (congestive heart failure) ([place])   . Colon polyps    adenomatous  . Difficulty sleeping    OCCASIONALLY  . Diverticulosis of colon (without mention of hemorrhage)   . Frequency of urination   . GERD (gastroesophageal reflux disease)   . Hiatal hernia   . History of bladder cancer 1981   EXCISION ONLY  . History of skin cancer    BASAL CELL  . Hyperlipidemia   . Lymphoma, non-Hodgkin's ([place]) 2011   [place]   . Macular degeneration    LEFT EYE  . Multiple nodules of lung 09/16/2011  . PAF (paroxysmal atrial fibrillation) ([place])   . Personal history of colonic polyps 07/15/2010   tubular adenoma  . Prostate cancer [facility])    patient denies  . PUD (peptic ulcer disease)   . Stomach cancer ([place])    NHL origin  . Stroke ([place])   . Weakness     Social History   Tobacco Use  . Smoking status: Former Smoker    Last attempt to quit: 10/29/1966    Years since quitting: 51.1  . Smokeless tobacco: Former Systems developer    Quit date: 06/29/1998  Substance Use Topics  . Alcohol use: Yes    Comment: Socially  . Drug use: No  Family History  Problem Relation Age of Onset  . Heart attack Father   . Other Mother        "old age"    No Known Allergies  Physical Exam: Constitutional: in no apparent distress  Vitals:   12/23/17 0814 12/23/17 1200  BP: 101/62 (!) 100/59  Pulse: 74   Resp: 20   Temp: 97.6 F (36.4 C) 97.8 F (36.6 C)  SpO2: 97%    EYES: anicteric ENMT: no thrush Cardiovascular: Cor RRR Respiratory: CTAB; normal respiratory effort GI: Bowel sounds are normal, liver is not enlarged, spleen is not enlarged Musculoskeletal: no pedal edema noted Skin: negatives: no rash Hematologic: no cervical lad  Lab Results  Component Value Date   WBC 13.7 (H) 12/22/2017   HGB 13.3 12/22/2017     HCT 43.9 12/22/2017   MCV 88.2 12/22/2017   PLT 192 12/22/2017    Lab Results  Component Value Date   CREATININE 1.59 (H) 12/22/2017   BUN 19 12/22/2017   NA 141 12/22/2017   K 4.2 12/22/2017   CL 101 12/22/2017   CO2 26 12/22/2017    Lab Results  Component Value Date   ALT 6 12/22/2017   AST 21 12/22/2017   ALKPHOS 35 (L) 12/22/2017     Microbiology: Recent Results (from the past 240 hour(s))  Blood culture (routine x 2)     Status: None (Preliminary result)   Collection Time: 12/22/17  4:53 PM  Result Value Ref Range Status   Specimen Description BLOOD RIGHT ANTECUBITAL  Final   Special Requests   Final    BOTTLES DRAWN AEROBIC AND ANAEROBIC Blood Culture adequate volume   Culture  Setup Time   Final    GRAM POSITIVE COCCI IN CHAINS IN BOTH AEROBIC AND ANAEROBIC BOTTLES Organism ID to follow CRITICAL RESULT CALLED TO, READ BACK BY AND VERIFIED WITH: R. RUMBARGER PHARMD, AT 1116 12/23/17 BY D. VANHOOK Performed at [facility], [place] [address], [place], [place] [address]    Culture GRAM POSITIVE COCCI  Final   Report Status PENDING  Incomplete  Blood Culture ID Panel (Reflexed)     Status: Abnormal   Collection Time: 12/22/17  4:53 PM  Result Value Ref Range Status   Enterococcus species DETECTED (A) NOT DETECTED Final    Comment: CRITICAL RESULT CALLED TO, READ BACK BY AND VERIFIED WITH: R. RUMBARGER PHARMD, AT 1116 12/23/17 BY D. VANHOOK    Vancomycin resistance NOT DETECTED NOT DETECTED Final   Listeria monocytogenes NOT DETECTED NOT DETECTED Final   Staphylococcus species NOT DETECTED NOT DETECTED Final   Staphylococcus aureus NOT DETECTED NOT DETECTED Final   Streptococcus species NOT DETECTED NOT DETECTED Final   Streptococcus agalactiae NOT DETECTED NOT DETECTED Final   Streptococcus pneumoniae NOT DETECTED NOT DETECTED Final   Streptococcus pyogenes NOT DETECTED NOT DETECTED Final   Acinetobacter baumannii NOT DETECTED NOT DETECTED Final    Enterobacteriaceae species NOT DETECTED NOT DETECTED Final   Enterobacter cloacae complex NOT DETECTED NOT DETECTED Final   Escherichia coli NOT DETECTED NOT DETECTED Final   Klebsiella oxytoca NOT DETECTED NOT DETECTED Final   Klebsiella pneumoniae NOT DETECTED NOT DETECTED Final   Proteus species NOT DETECTED NOT DETECTED Final   Serratia marcescens NOT DETECTED NOT DETECTED Final   Haemophilus influenzae NOT DETECTED NOT DETECTED Final   Neisseria meningitidis NOT DETECTED NOT DETECTED Final   Pseudomonas aeruginosa NOT DETECTED NOT DETECTED Final   Candida albicans NOT DETECTED NOT DETECTED  Final   Candida glabrata NOT DETECTED NOT DETECTED Final   Candida krusei NOT DETECTED NOT DETECTED Final   Candida parapsilosis NOT DETECTED NOT DETECTED Final   Candida tropicalis NOT DETECTED NOT DETECTED Final    Comment: Performed at [facility], [place] [address], [place], [place] [address]  Blood culture (routine x 2)     Status: None (Preliminary result)   Collection Time: 12/22/17  5:11 PM  Result Value Ref Range Status   Specimen Description BLOOD RIGHT HAND  Final   Special Requests   Final    BOTTLES DRAWN AEROBIC AND ANAEROBIC Blood Culture adequate volume   Culture   Final    NO GROWTH < 24 HOURS Performed at [facility], [place] [address] [place], [place] [address]    Report Status PENDING  Incomplete  Respiratory Panel by PCR     Status: None   Collection Time: 12/22/17  9:33 PM  Result Value Ref Range Status   Adenovirus NOT DETECTED NOT DETECTED Final   Coronavirus 229E NOT DETECTED NOT DETECTED Final   Coronavirus HKU1 NOT DETECTED NOT DETECTED Final   Coronavirus NL63 NOT DETECTED NOT DETECTED Final   Coronavirus OC43 NOT DETECTED NOT DETECTED Final   Metapneumovirus NOT DETECTED NOT DETECTED Final   Rhinovirus / Enterovirus NOT DETECTED NOT DETECTED Final   Influenza A NOT DETECTED NOT DETECTED Final   Influenza B NOT DETECTED NOT DETECTED Final    Parainfluenza Virus 1 NOT DETECTED NOT DETECTED Final   Parainfluenza Virus 2 NOT DETECTED NOT DETECTED Final   Parainfluenza Virus 3 NOT DETECTED NOT DETECTED Final   Parainfluenza Virus 4 NOT DETECTED NOT DETECTED Final   Respiratory Syncytial Virus NOT DETECTED NOT DETECTED Final   Bordetella pertussis NOT DETECTED NOT DETECTED Final   Chlamydophila pneumoniae NOT DETECTED NOT DETECTED Final   Mycoplasma pneumoniae NOT DETECTED NOT DETECTED Final    Comment: Performed at [facility], [place] [address], [place], [place] [address]  MRSA PCR Screening     Status: None   Collection Time: 12/22/17  9:33 PM  Result Value Ref Range Status   MRSA by PCR NEGATIVE NEGATIVE Final    Comment:        The GeneXpert MRSA Assay (FDA approved for NASAL specimens only), is one component of a comprehensive MRSA colonization surveillance program. It is not intended to diagnose MRSA infection nor to guide or monitor treatment for MRSA infections. Performed at [facility], [place] [address], [place], [place] [address]     Tiah Heckel W Dorothy Polhemus, [place] for Infectious Disease [facility] [email or web address] [phone] pager  [phone] cell 12/23/2017, 1:44 PM

## 2017-12-23 NOTE — Progress Notes (Signed)
Initial Nutrition Assessment  DOCUMENTATION CODES:   Not applicable  INTERVENTION:  - Daughter reports plan for SLP evaluation. - Continue to encourage PO intakes.  - RD will monitor if additional nutrition-related intervention is warranted.   NUTRITION DIAGNOSIS:   Swallowing difficulty related to dysphagia, chronic illness(Parkinson's disease) as evidenced by per patient/family report.  GOAL:   Patient will meet greater than or equal to 90% of their needs  MONITOR:   PO intake, Weight trends, Labs  REASON FOR ASSESSMENT:   Malnutrition Screening Tool  ASSESSMENT:   84 y.o. male with medical history significant of stroke, Parkinson's disease, hyperlipidemia, GERD, CHF, PAF, CKD-3, remote bladder cancer (excision only), non-Hodgkin lymphoma (s/p of chemo and radiation therapy, not on chemo currently). He presented to the ED with cough, SOB, fever, and chills which started on 9/19. He states that he had mild diarrhea 9/19, which has resolved. Patient is on Colace and Senokot. He has a temperature 100.1 in the ED. His daughter stated that patient chokes sometimes when eating food. Of note, patient was diagnosed with PNA recently.  BMI indicates overweight status/borderline obesity, appropriate for advanced age. No PO intakes documented since admission. Daughter at bedside and assists in providing information. Patient ate oatmeal and drank coffee for breakfast. He lives at ALF ([facility]) and typically eats a bowl of cereal and half of a banana in his room for breakfast and goes to the dining hall for lunch and dinner. Intakes of lunch and dinner vary depending on how well patient likes what is served.   He denies any abdominal pain or nausea at this time. RN and daughter reports that patient coughed on liquids this AM. Daughter states that patient has been told in the past that he needs to take small, slow sips to avoid choking with drinking but that patient tends to take large  gulps. She states that he is good about cutting food into small pieces and chewing thoroughly. Patient does not drink ONS such as Ensure or Boost. He has a refrigerator in his room at [facility].   Daughter reports that patient gained "a lot of weight quickly due to fluid and then lost it over time." Patient reports that he has lost 30 lb in the past 1 year. Daughter reports that patient was recently at PCP and Cardiologist and neither of them were concerned about current weight or weight loss. Current weight is 207 lb. Per chart review, patient weighed 214 lb on 01/12/17. This indicates 7 lb weight loss (3% body weight) in the past 11 months.    Medications reviewed; 325 mg ferrous sulfate/day, 1 tablet Prosight/day, 1 g Lovaza BID.  Labs reviewed; creatinine: 1.59 mg/dL, Ca: 8.7 mg/dL, Alk Phos slightly low, GFR: 37 mL/min.  IVF; NS @ 50 mL/hr.     NUTRITION - FOCUSED PHYSICAL EXAM:    Most Recent Value  Orbital Region  No depletion  Upper Arm Region  Mild depletion  Thoracic and Lumbar Region  No depletion  Buccal Region  No depletion  Temple Region  No depletion  Clavicle Bone Region  No depletion  Clavicle and Acromion Bone Region  No depletion  Scapular Bone Region  No depletion  Dorsal Hand  No depletion  Patellar Region  No depletion  Anterior Thigh Region  No depletion  Posterior Calf Region  Mild depletion  Edema (RD Assessment)  None  Hair  Reviewed  Eyes  Reviewed  Mouth  Reviewed  Skin  Reviewed  Nails  Reviewed       Diet Order:   Diet Order            Diet Heart Room service appropriate? Yes; Fluid consistency: Thin  Diet effective now              EDUCATION NEEDS:   No education needs have been identified at this time  Skin:  Skin Assessment: Reviewed RN Assessment  Last BM:  PTA/unknown  Height:   Ht Readings from Last 1 Encounters:  12/23/17 _0  (1.778 m)    Weight:   Wt Readings from Last 1 Encounters:  12/23/17 93.8 kg     Ideal Body Weight:  75.45 kg  BMI:  Body mass index is 29.67 kg/m.  Estimated Nutritional Needs:   Kcal:  2707-8675  Protein:  70-80 grams  Fluid:  >/= 1.5 L/day     Jarome Matin, MS, RD, LDN, [facility] Inpatient Clinical Dietitian Pager # [phone] After hours/weekend pager # [phone]

## 2017-12-23 NOTE — Care Management Note (Signed)
Case Management Note  Patient Details  Name: Jason [name] MRN: [id number] Date of Birth: Dec 04, 1927  Subjective/Objective:  From [name], presents with  Sepsis due to HCAP vs asp pna, hld, gerd, afib, acute renal failure, cva, parkinsonis, chronic chf, diarrhea.                    Action/Plan: NCM will follow for transition of care needs.   Expected Discharge Date:                  Expected Discharge Plan:  Assisted Living / Rest Home  In-House Referral:  Clinical Social Work  Discharge planning Services  CM Consult  Post Acute Care Choice:    Choice offered to:     DME Arranged:    DME Agency:     HH Arranged:    HH Agency:     Status of Service:  In process, will continue to follow  If discussed at Long Length of Stay Meetings, dates discussed:    Additional Comments:  Zenon Mayo, RN 12/23/2017, 9:14 AM

## 2017-12-24 LAB — BASIC METABOLIC PANEL
ANION GAP: 9 (ref 5–15)
BUN: 17 mg/dL (ref 8–23)
CO2: 26 mmol/L (ref 22–32)
CREATININE: 1.48 mg/dL — AB (ref 0.61–1.24)
Calcium: 8.3 mg/dL — ABNORMAL LOW (ref 8.9–10.3)
Chloride: 105 mmol/L (ref 98–111)
GFR, EST AFRICAN AMERICAN: 47 mL/min — AB (ref >60)
GFR, EST NON AFRICAN AMERICAN: 41 mL/min — AB (ref >60)
Glucose, Bld: 91 mg/dL (ref 70–99)
Potassium: 3.4 mmol/L — ABNORMAL LOW (ref 3.5–5.1)
SODIUM: 140 mmol/L (ref 135–145)

## 2017-12-24 LAB — URINE CULTURE

## 2017-12-24 LAB — CBC
HCT: 39.9 % (ref 39.0–52.0)
Hemoglobin: 12.6 g/dL — ABNORMAL LOW (ref 13.0–17.0)
MCH: 27.3 pg (ref 26.0–34.0)
MCHC: 31.6 g/dL (ref 30.0–36.0)
MCV: 86.4 fL (ref 78.0–100.0)
Platelets: 155 10*3/uL (ref 150–400)
RBC: 4.62 MIL/uL (ref 4.22–5.81)
RDW: 14.6 % (ref 11.5–15.5)
WBC: 10.8 10*3/uL — AB (ref 4.0–10.5)

## 2017-12-24 LAB — LEGIONELLA PNEUMOPHILA SEROGP 1 UR AG: L. pneumophila Serogp 1 Ur Ag: NEGATIVE

## 2017-12-24 MED ORDER — POTASSIUM CHLORIDE CRYS ER 20 MEQ PO TBCR
40.0000 meq | EXTENDED_RELEASE_TABLET | Freq: Once | ORAL | Status: AC
  Administered 2017-12-24: 40 meq via ORAL
  Filled 2017-12-24: qty 2

## 2017-12-24 NOTE — Plan of Care (Signed)
  Problem: Activity: Goal: Risk for activity intolerance will decrease Outcome: Progressing   Problem: Clinical Measurements: Goal: Cardiovascular complication will be avoided Outcome: Progressing   Problem: Clinical Measurements: Goal: Respiratory complications will improve Outcome: Progressing

## 2017-12-24 NOTE — Progress Notes (Signed)
    [facility] for Infectious Disease   Reason for visit: Follow up on Enterococcal positive blood culture  Interval History: no new positive cultures, remains 1/2.  No new concerns for source. Swallow eval noted   Physical Exam: Constitutional:  Vitals:   12/23/17 2322 12/24/17 0724  BP: 118/61 126/66  Pulse: (!) 41 62  Resp: (!) 21   Temp: (!) 97.4 F (36.3 C) 97.7 F (36.5 C)  SpO2: 95% 96%   patient appears in NAD  Impression: clinical course c/w aspiration pneumonia.  As long as no new positive blood cultures, continue with plan with unasyn/Augmentin for appropriate course (5-7 days).    Plan: 1.  No changes Please call if there is new growth, otherwise, will sign off. thanks

## 2017-12-24 NOTE — Discharge Instructions (Signed)

## 2017-12-24 NOTE — Progress Notes (Signed)
PROGRESS NOTE    [name]  WIO:[id number] DOB: [birth year] DOA: 12/22/2017 PCP: [name], MD  Brief NarrativeJimmy JEMAL [name] is a 84 y.o. male with medical history significant of stroke, Parkinson's disease, hyperlipidemia, GERD, dCHF, PAF on Eliquis, CKD-3, remote bladder cancer (excision only), non-Hodgkin lymphoma (s/p of chemo and radiation therapy, not on chemo currently, following up with Dr. Beryle Beams), presents with cough, shortness of breath, fever and chills, found to have aspiration pneumonia, started antibiotics   Assessment & Plan:     Aspiration pneumonia -clinically improving, off IV vancomycin -blood cultures with enterococcus, clinical significance unknown, appreciate ID input -Day 2 of IV Unasyn now -repeat blood cultures drawn this a.m. -SLP following, status post bedside evaluation yesterday noted to have oropharyngeal dysphagia with signs and symptoms of overt aspiration, moderate risk, remains on a regular diet with thin liquids -will discuss ongoing risk with family   Paroxysmal atrial fibrillation -In sinus rhythm at this time, -Continue metoprolol and anticoagulation with Eliquis   history of CVA -Continue eliquis and statin   History of Parkinson's disease -Continue Sinemet   Chronic diastolic CHF - Last echo with preserved ejection fraction, -Clinically appears euvolemic now cut down and stop fluids -Resume diuretics in 1- 2 days   Mild acute kidney injury -due to above illness, sepsis and diuretics  DVT prophylaxis: ELiquis Code Status: FUll Code Family Communication:  no family at bedside Disposition Plan: back to ALF when stable      Procedures:   Antimicrobials:    Subjective: -Feels better, continues to have intermittent cough, breathing improving, back to baseline  Objective: Vitals:   12/23/17 1606 12/23/17 2156 12/23/17 2322 12/24/17 0724  BP: 122/68 127/67 118/61 126/66  Pulse: (!) 57 65 (!) 41 62  Resp: 20   (!) 21   Temp: (!) 97.3 F (36.3 C)  (!) 97.4 F (36.3 C) 97.7 F (36.5 C)  TempSrc: Oral  Oral Oral  SpO2: 100%  95% 96%  Weight:      Height:        Intake/Output Summary (Last 24 hours) at 12/24/2017 1021 Last data filed at 12/24/2017 0200 Gross per 24 hour  Intake 580 ml  Output 600 ml  Net -20 ml   Filed Weights   12/22/17 2100 12/23/17 0655  Weight: 94.8 kg 93.8 kg    Examination:  Gen: Awake, Alert, Oriented X 3,  HEENT: PERRLA, Neck supple, no JVD Lungs: Ronchi at L Base CVS: S1S2/RRR Abd: soft, Non tender, non distended, BS present Extremities: No edema Skin: no new rashes Psychiatry: Mood & affect appropriate.     Data Reviewed:   CBC: Recent Labs  Lab 12/22/17 1653 12/24/17 0531  WBC 13.7* 10.8*  HGB 13.3 12.6*  HCT 43.9 39.9  MCV 88.2 86.4  PLT 192 426   Basic Metabolic Panel: Recent Labs  Lab 12/22/17 1653 12/24/17 0531  NA 141 140  K 4.2 3.4*  CL 101 105  CO2 26 26  GLUCOSE 103* 91  BUN 19 17  CREATININE 1.59* 1.48*  CALCIUM 8.7* 8.3*   GFR: Estimated Creatinine Clearance: 40.4 mL/min (A) (by C-G formula based on SCr of 1.48 mg/dL (H)). Liver Function Tests: Recent Labs  Lab 12/22/17 1653  AST 21  ALT 6  ALKPHOS 35*  BILITOT 0.8  PROT 8.4*  ALBUMIN 2.9*   No results for input(s): LIPASE, AMYLASE in the last 168 hours. No results for input(s): AMMONIA in the last 168 hours. Coagulation Profile: No  results for input(s): INR, PROTIME in the last 168 hours. Cardiac Enzymes: No results for input(s): CKTOTAL, CKMB, CKMBINDEX, TROPONINI in the last 168 hours. BNP (last 3 results) No results for input(s): PROBNP in the last 8760 hours. HbA1C: No results for input(s): HGBA1C in the last 72 hours. CBG: No results for input(s): GLUCAP in the last 168 hours. Lipid Profile: No results for input(s): CHOL, HDL, LDLCALC, TRIG, CHOLHDL, LDLDIRECT in the last 72 hours. Thyroid Function Tests: No results for input(s): TSH, T4TOTAL,  FREET4, T3FREE, THYROIDAB in the last 72 hours. Anemia Panel: No results for input(s): VITAMINB12, FOLATE, FERRITIN, TIBC, IRON, RETICCTPCT in the last 72 hours. Urine analysis:    Component Value Date/Time   COLORURINE YELLOW 12/22/2017 [place] 12/22/2017 1735   LABSPEC 1.012 12/22/2017 1735   PHURINE 6.0 12/22/2017 1735   GLUCOSEU NEGATIVE 12/22/2017 1735   HGBUR NEGATIVE 12/22/2017 1735   BILIRUBINUR NEGATIVE 12/22/2017 1735   KETONESUR NEGATIVE 12/22/2017 1735   PROTEINUR NEGATIVE 12/22/2017 1735   UROBILINOGEN 1.0 07/07/2011 1942   NITRITE NEGATIVE 12/22/2017 1735   LEUKOCYTESUR LARGE (A) 12/22/2017 1735   Sepsis Labs: @LABRCNTIP (procalcitonin:4,lacticidven:4)  ) Recent Results (from the past 240 hour(s))  Blood culture (routine x 2)     Status: None (Preliminary result)   Collection Time: 12/22/17  4:53 PM  Result Value Ref Range Status   Specimen Description BLOOD RIGHT ANTECUBITAL  Final   Special Requests   Final    BOTTLES DRAWN AEROBIC AND ANAEROBIC Blood Culture adequate volume   Culture  Setup Time   Final    GRAM POSITIVE COCCI IN CHAINS IN BOTH AEROBIC AND ANAEROBIC BOTTLES CRITICAL RESULT CALLED TO, READ BACK BY AND VERIFIED WITH: RJiles Crocker PHARMD, AT 1116 12/23/17 BY D. VANHOOK Performed at [facility], [place] [address] [place], [place] [address]    Culture GRAM POSITIVE COCCI  Final   Report Status PENDING  Incomplete  Blood Culture ID Panel (Reflexed)     Status: Abnormal   Collection Time: 12/22/17  4:53 PM  Result Value Ref Range Status   Enterococcus species DETECTED (A) NOT DETECTED Final    Comment: CRITICAL RESULT CALLED TO, READ BACK BY AND VERIFIED WITH: R. RUMBARGER PHARMD, AT 1116 12/23/17 BY D. VANHOOK    Vancomycin resistance NOT DETECTED NOT DETECTED Final   Listeria monocytogenes NOT DETECTED NOT DETECTED Final   Staphylococcus species NOT DETECTED NOT DETECTED Final   Staphylococcus aureus NOT DETECTED NOT  DETECTED Final   Streptococcus species NOT DETECTED NOT DETECTED Final   Streptococcus agalactiae NOT DETECTED NOT DETECTED Final   Streptococcus pneumoniae NOT DETECTED NOT DETECTED Final   Streptococcus pyogenes NOT DETECTED NOT DETECTED Final   Acinetobacter baumannii NOT DETECTED NOT DETECTED Final   Enterobacteriaceae species NOT DETECTED NOT DETECTED Final   Enterobacter cloacae complex NOT DETECTED NOT DETECTED Final   Escherichia coli NOT DETECTED NOT DETECTED Final   Klebsiella oxytoca NOT DETECTED NOT DETECTED Final   Klebsiella pneumoniae NOT DETECTED NOT DETECTED Final   Proteus species NOT DETECTED NOT DETECTED Final   Serratia marcescens NOT DETECTED NOT DETECTED Final   Haemophilus influenzae NOT DETECTED NOT DETECTED Final   Neisseria meningitidis NOT DETECTED NOT DETECTED Final   Pseudomonas aeruginosa NOT DETECTED NOT DETECTED Final   Candida albicans NOT DETECTED NOT DETECTED Final   Candida glabrata NOT DETECTED NOT DETECTED Final   Candida krusei NOT DETECTED NOT DETECTED Final   Candida parapsilosis NOT DETECTED  NOT DETECTED Final   Candida tropicalis NOT DETECTED NOT DETECTED Final    Comment: Performed at [facility], [place] [address], [place], [place] [address]  Blood culture (routine x 2)     Status: None (Preliminary result)   Collection Time: 12/22/17  5:11 PM  Result Value Ref Range Status   Specimen Description BLOOD RIGHT HAND  Final   Special Requests   Final    BOTTLES DRAWN AEROBIC AND ANAEROBIC Blood Culture adequate volume   Culture   Final    NO GROWTH 2 DAYS Performed at [facility], [place] [address] [place], [place] [address]    Report Status PENDING  Incomplete  Respiratory Panel by PCR     Status: None   Collection Time: 12/22/17  9:33 PM  Result Value Ref Range Status   Adenovirus NOT DETECTED NOT DETECTED Final   Coronavirus 229E NOT DETECTED NOT DETECTED Final   Coronavirus HKU1 NOT DETECTED NOT DETECTED Final   Coronavirus  NL63 NOT DETECTED NOT DETECTED Final   Coronavirus OC43 NOT DETECTED NOT DETECTED Final   Metapneumovirus NOT DETECTED NOT DETECTED Final   Rhinovirus / Enterovirus NOT DETECTED NOT DETECTED Final   Influenza A NOT DETECTED NOT DETECTED Final   Influenza B NOT DETECTED NOT DETECTED Final   Parainfluenza Virus 1 NOT DETECTED NOT DETECTED Final   Parainfluenza Virus 2 NOT DETECTED NOT DETECTED Final   Parainfluenza Virus 3 NOT DETECTED NOT DETECTED Final   Parainfluenza Virus 4 NOT DETECTED NOT DETECTED Final   Respiratory Syncytial Virus NOT DETECTED NOT DETECTED Final   Bordetella pertussis NOT DETECTED NOT DETECTED Final   Chlamydophila pneumoniae NOT DETECTED NOT DETECTED Final   Mycoplasma pneumoniae NOT DETECTED NOT DETECTED Final    Comment: Performed at [facility], [place] [address] [place], [place] [address]  MRSA PCR Screening     Status: None   Collection Time: 12/22/17  9:33 PM  Result Value Ref Range Status   MRSA by PCR NEGATIVE NEGATIVE Final    Comment:        The GeneXpert MRSA Assay (FDA approved for NASAL specimens only), is one component of a comprehensive MRSA colonization surveillance program. It is not intended to diagnose MRSA infection nor to guide or monitor treatment for MRSA infections. Performed at [facility], [place] [address], [place],  [address]          Radiology Studies: Dg Chest 2 View  Result Date: 12/22/2017 CLINICAL DATA:  Abdominal pain and nausea.  Dyspnea. EXAM: CHEST - 2 VIEW COMPARISON:  12/07/2017 FINDINGS: The patient's chin obscures the left lung apex. Stable cardiomegaly with interstitial edema. Hazy opacity at the left lung base compatible with a layering left small to moderate effusion similar in appearance to prior. Superimposed pneumonia would be difficult to entirely exclude. Probable adjacent atelectasis at the left lung base. Degenerative changes are present about both shoulders and dorsal spine. IMPRESSION:  1. Cardiomegaly with mild CHF. 2. Small to moderate layering left effusion is believed to account for the hazy opacity at the left lung base. Superimposed pneumonia and/or atelectasis would be difficult to entirely exclude. Electronically Signed   By: Ashley Royalty M.D.   On: 12/22/2017 18:52        Scheduled Meds: . apixaban  2.5 mg Oral BID  . carbidopa-levodopa  1 tablet Oral TID  . ferrous sulfate  325 mg Oral Q breakfast  . fluticasone  2 spray  Each Nare Daily  . Melatonin  3 mg Oral QHS  . metoprolol tartrate  25 mg Oral BID  . multivitamin  1 tablet Oral Daily  . omega-3 acid ethyl esters  1 g Oral BID  . pantoprazole  80 mg Oral Q1200  . simvastatin  20 mg Oral QPM  . tamsulosin  0.4 mg Oral QHS   Continuous Infusions: . ampicillin-sulbactam (UNASYN) IV 3 g (12/24/17 0600)     LOS: 2 days    Time spent: 79min    Domenic Polite, MD [facility] Page via [email or web address], password [email or web address] After 7PM please contact night-coverage  12/24/2017, 10:21 AM

## 2017-12-25 LAB — CBC
HEMATOCRIT: 40.1 % (ref 39.0–52.0)
HEMOGLOBIN: 12.7 g/dL — AB (ref 13.0–17.0)
MCH: 27.3 pg (ref 26.0–34.0)
MCHC: 31.7 g/dL (ref 30.0–36.0)
MCV: 86.1 fL (ref 78.0–100.0)
Platelets: 159 10*3/uL (ref 150–400)
RBC: 4.66 MIL/uL (ref 4.22–5.81)
RDW: 14.4 % (ref 11.5–15.5)
WBC: 9.7 10*3/uL (ref 4.0–10.5)

## 2017-12-25 LAB — BASIC METABOLIC PANEL
Anion gap: 11 (ref 5–15)
BUN: 15 mg/dL (ref 8–23)
CHLORIDE: 107 mmol/L (ref 98–111)
CO2: 22 mmol/L (ref 22–32)
CREATININE: 1.43 mg/dL — AB (ref 0.61–1.24)
Calcium: 8.3 mg/dL — ABNORMAL LOW (ref 8.9–10.3)
GFR calc Af Amer: 49 mL/min — ABNORMAL LOW (ref >60)
GFR calc non Af Amer: 42 mL/min — ABNORMAL LOW (ref >60)
GLUCOSE: 84 mg/dL (ref 70–99)
Potassium: 3.4 mmol/L — ABNORMAL LOW (ref 3.5–5.1)
Sodium: 140 mmol/L (ref 135–145)

## 2017-12-25 LAB — CULTURE, BLOOD (ROUTINE X 2): SPECIAL REQUESTS: ADEQUATE

## 2017-12-25 MED ORDER — FUROSEMIDE 40 MG PO TABS
40.0000 mg | ORAL_TABLET | Freq: Every day | ORAL | Status: DC
  Administered 2017-12-25 – 2017-12-27 (×3): 40 mg via ORAL
  Filled 2017-12-25 (×4): qty 1

## 2017-12-25 MED ORDER — APIXABAN 5 MG PO TABS
5.0000 mg | ORAL_TABLET | Freq: Two times a day (BID) | ORAL | Status: DC
  Administered 2017-12-25 – 2017-12-27 (×4): 5 mg via ORAL
  Filled 2017-12-25 (×4): qty 1

## 2017-12-25 MED ORDER — POTASSIUM CHLORIDE CRYS ER 20 MEQ PO TBCR
40.0000 meq | EXTENDED_RELEASE_TABLET | Freq: Once | ORAL | Status: AC
  Administered 2017-12-25: 40 meq via ORAL
  Filled 2017-12-25: qty 2

## 2017-12-25 MED ORDER — POTASSIUM CHLORIDE CRYS ER 20 MEQ PO TBCR
40.0000 meq | EXTENDED_RELEASE_TABLET | Freq: Every day | ORAL | Status: DC
  Administered 2017-12-25 – 2017-12-27 (×3): 40 meq via ORAL
  Filled 2017-12-25 (×4): qty 2

## 2017-12-25 MED ORDER — AMOXICILLIN-POT CLAVULANATE 875-125 MG PO TABS
1.0000 | ORAL_TABLET | Freq: Two times a day (BID) | ORAL | Status: DC
  Administered 2017-12-26 – 2017-12-27 (×4): 1 via ORAL
  Filled 2017-12-25 (×4): qty 1

## 2017-12-25 NOTE — Progress Notes (Signed)
  Speech Language Pathology Treatment: Dysphagia  Patient Details Name: Jason [name] MRN: [id number] DOB: [birth year] Today's Date: 12/25/2017 Time: 1215-1232 SLP Time Calculation (min) (ACUTE ONLY): 17 min  Assessment / Plan / Recommendation Clinical Impression  Pt tolerated thin, puree, and regular texture trials with no overt s/s of aspiration.  Silent aspiration has been noted on past objective studies. Discussed benefits and limitations of repeat instrumental with pt and daughter.  Pt states he would prefer to remain on regular diet and would not want diet modifications.  Daughter is in agreement to continue regular diet with strategies to minimize risk of aspiration and acknowledges that it may not be completely eliminated.  Pt demonstrated independence with small bites (told SLP half spoon of mashed potatoes was too big of a bite), but required consistent cuing to utilize chin tuck.  Although level of effort used with swallow could not be assessed, pt appeared to be intentional with swallow and took his time with all po trials.  Pt exhibited good oral clearance after seemingly thorough mastication.  Pt has completed swallowing exercises in the past with good results. Daughter would like for swallow exercises to be reinitiated and would like SLP to work with pt at next level of care for strengthening and maintenance.  Pt demonstrated ability to complete effortful swallow, CTAR, Masako (with quick fatigue), expiration against resistance. Pt as completed RMST in the past and may be a good candidate for EMST.  Pt was not able to execute Mendelssohn maneuver this session.   HPI HPI: 84 y.o. male with medical history significant of stroke, Parkinson's disease, hyperlipidemia, GERD, dCHF, PAF on Eliquis, CKD-3, remote bladder cancer (excision only), non-Hodgkin lymphoma (s/p of chemo and radiation therapy, not on chemo currently, following up with Dr. Beryle [name]), presents with cough, shortness of  breath, fever and chills.  Pt has long hx dysphagia with known silent aspiration.  Most recent MBSS was a little over a year ago per daughter. CXR 9/20 revealed: "Small to moderate layering left effusion is believed to account for the hazy opacity at the left lung base. Superimposed pneumonia and/or atelectasis would be difficult to entirely exclude."      SLP Plan  Continue with current plan of care       Recommendations  Supervision: Full supervision/cueing for compensatory strategies Compensations: Minimize environmental distractions;Slow rate;Small sips/bites;Chin tuck Postural Changes and/or Swallow Maneuvers: Seated upright 90 degrees                Oral Care Recommendations: Oral care BID Follow up Recommendations: (ST at next level of care) SLP Visit Diagnosis: Dysphagia, oropharyngeal phase (R13.12) Plan: Continue with current plan of care       [place], [place], [place] Office: [phone] 12/25/2017, 12:52 PM

## 2017-12-25 NOTE — Care Management Important Message (Signed)
Important Message  Patient Details  Name: Jason [name] MRN: [id number] Date of Birth: [birth year]   Medicare Important Message Given:  Yes    [name] 12/25/2017, 2:37 PM

## 2017-12-25 NOTE — Progress Notes (Signed)
PROGRESS NOTE    Jason [name]  KKX:[id number] DOB: 1927-07-20 DOA: 12/22/2017 PCP: [name], MD  Brief NarrativeJimmy TYRECE [name] is a 84 y.o. male with medical history significant of stroke, Parkinson's disease, hyperlipidemia, GERD, dCHF, PAF on Eliquis, CKD-3, remote bladder cancer (excision only), non-Hodgkin lymphoma (s/p of chemo and radiation therapy, not on chemo currently, following up with Dr. Beryle Beams), presents with cough, shortness of breath, fever and chills, found to have aspiration pneumonia, started antibiotics   Assessment & Plan:     Aspiration pneumonia -long-standing history of dysphagia -Clinically improving, on day 3 of IV Unasyn now, blood cultures grew enterococcus -Repeat blood cultures yesterday, no growth -SLP following, status post bedside evaluation yesterday noted to have oropharyngeal dysphagia with signs and symptoms of overt aspiration, moderate risk, remains on a regular diet with thin liquids, discussed with the daughter, long-standing history of dysphagia, aspiration risk with persist -DO NOT RESUSCITATE recommended, daughter agreeable to this discussion today -Discharge planning, PT OT -will likely need SNF for rehabilitation   Paroxysmal atrial fibrillation -In sinus rhythm at this time, -Continue metoprolol and anticoagulation with Eliquis   history of CVA -Continue eliquis and statin   History of Parkinson's disease -Continue Sinemet   Chronic diastolic CHF - Last echo with preserved ejection fraction, -Clinically appears euvolemic -resume oral diuretics   Mild acute kidney injury -due to above illness, sepsis and diuretics  DVT prophylaxis: ELiquis Code Status: CODE STATUS changed to DO NOT RESUSCITATE, discussed with daughter today Family Communication:  Daughter at bedside Disposition Plan: may need short-term rehabilitation, await PT OT eval      Procedures:   Antimicrobials:    Subjective: -feels better,  breathing is improving, not back to baseline yet  Objective: Vitals:   12/24/17 0724 12/24/17 1646 12/24/17 2244 12/25/17 0750  BP: 126/66 138/74 133/75 (!) 146/71  Pulse: 62 (!) 58 68 77  Resp:   20 20  Temp: 97.7 F (36.5 C) (!) 97.4 F (36.3 C) 97.6 F (36.4 C) 97.9 F (36.6 C)  TempSrc: Oral Oral Oral Oral  SpO2: 96% 99%  95%  Weight:      Height:        Intake/Output Summary (Last 24 hours) at 12/25/2017 1696 Last data filed at 12/24/2017 1800 Gross per 24 hour  Intake -  Output 100 ml  Net -100 ml   Filed Weights   12/22/17 2100 12/23/17 0655  Weight: 94.8 kg 93.8 kg    Examination:  Gen: Awake, Alert, Oriented X 3,  HEENT: PERRLA, Neck supple, no JVD Lungs: Ronchi at L base CVS: S1S2/RRR Abd: soft, Non tender, non distended, BS present Extremities: No Cyanosis, Clubbing or edema Skin: no new rashes Psychiatry: Mood & affect appropriate.     Data Reviewed:   CBC: Recent Labs  Lab 12/22/17 1653 12/24/17 0531 12/25/17 0232  WBC 13.7* 10.8* 9.7  HGB 13.3 12.6* 12.7*  HCT 43.9 39.9 40.1  MCV 88.2 86.4 86.1  PLT 192 155 789   Basic Metabolic Panel: Recent Labs  Lab 12/22/17 1653 12/24/17 0531 12/25/17 0232  NA 141 140 140  K 4.2 3.4* 3.4*  CL 101 105 107  CO2 26 26 22   GLUCOSE 103* 91 84  BUN 19 17 15   CREATININE 1.59* 1.48* 1.43*  CALCIUM 8.7* 8.3* 8.3*   GFR: Estimated Creatinine Clearance: 41.9 mL/min (A) (by C-G formula based on SCr of 1.43 mg/dL (H)). Liver Function Tests: Recent Labs  Lab 12/22/17 1653  AST  21  ALT 6  ALKPHOS 35*  BILITOT 0.8  PROT 8.4*  ALBUMIN 2.9*   No results for input(s): LIPASE, AMYLASE in the last 168 hours. No results for input(s): AMMONIA in the last 168 hours. Coagulation Profile: No results for input(s): INR, PROTIME in the last 168 hours. Cardiac Enzymes: No results for input(s): CKTOTAL, CKMB, CKMBINDEX, TROPONINI in the last 168 hours. BNP (last 3 results) No results for input(s): PROBNP  in the last 8760 hours. HbA1C: No results for input(s): HGBA1C in the last 72 hours. CBG: No results for input(s): GLUCAP in the last 168 hours. Lipid Profile: No results for input(s): CHOL, HDL, LDLCALC, TRIG, CHOLHDL, LDLDIRECT in the last 72 hours. Thyroid Function Tests: No results for input(s): TSH, T4TOTAL, FREET4, T3FREE, THYROIDAB in the last 72 hours. Anemia Panel: No results for input(s): VITAMINB12, FOLATE, FERRITIN, TIBC, IRON, RETICCTPCT in the last 72 hours. Urine analysis:    Component Value Date/Time   COLORURINE YELLOW 12/22/2017 [place] 12/22/2017 1735   LABSPEC 1.012 12/22/2017 1735   PHURINE 6.0 12/22/2017 1735   GLUCOSEU NEGATIVE 12/22/2017 1735   HGBUR NEGATIVE 12/22/2017 1735   BILIRUBINUR NEGATIVE 12/22/2017 1735   KETONESUR NEGATIVE 12/22/2017 1735   PROTEINUR NEGATIVE 12/22/2017 1735   UROBILINOGEN 1.0 07/07/2011 1942   NITRITE NEGATIVE 12/22/2017 1735   LEUKOCYTESUR LARGE (A) 12/22/2017 1735   Sepsis Labs: @LABRCNTIP (procalcitonin:4,lacticidven:4)  ) Recent Results (from the past 240 hour(s))  Blood culture (routine x 2)     Status: Abnormal (Preliminary result)   Collection Time: 12/22/17  4:53 PM  Result Value Ref Range Status   Specimen Description BLOOD RIGHT ANTECUBITAL  Final   Special Requests   Final    BOTTLES DRAWN AEROBIC AND ANAEROBIC Blood Culture adequate volume   Culture  Setup Time   Final    GRAM POSITIVE COCCI IN CHAINS IN BOTH AEROBIC AND ANAEROBIC BOTTLES CRITICAL RESULT CALLED TO, READ BACK BY AND VERIFIED WITH: R. RUMBARGER PHARMD, AT 1116 12/23/17 BY D. VANHOOK    Culture (A)  Final    ENTEROCOCCUS FAECALIS SUSCEPTIBILITIES TO FOLLOW Performed at [facility], [place] [address] [place], [place] [address]    Report Status PENDING  Incomplete  Blood Culture ID Panel (Reflexed)     Status: Abnormal   Collection Time: 12/22/17  4:53 PM  Result Value Ref Range Status   Enterococcus species DETECTED  (A) NOT DETECTED Final    Comment: CRITICAL RESULT CALLED TO, READ BACK BY AND VERIFIED WITH: R. RUMBARGER PHARMD, AT 1116 12/23/17 BY D. VANHOOK    Vancomycin resistance NOT DETECTED NOT DETECTED Final   Listeria monocytogenes NOT DETECTED NOT DETECTED Final   Staphylococcus species NOT DETECTED NOT DETECTED Final   Staphylococcus aureus NOT DETECTED NOT DETECTED Final   Streptococcus species NOT DETECTED NOT DETECTED Final   Streptococcus agalactiae NOT DETECTED NOT DETECTED Final   Streptococcus pneumoniae NOT DETECTED NOT DETECTED Final   Streptococcus pyogenes NOT DETECTED NOT DETECTED Final   Acinetobacter baumannii NOT DETECTED NOT DETECTED Final   Enterobacteriaceae species NOT DETECTED NOT DETECTED Final   Enterobacter cloacae complex NOT DETECTED NOT DETECTED Final   Escherichia coli NOT DETECTED NOT DETECTED Final   Klebsiella oxytoca NOT DETECTED NOT DETECTED Final   Klebsiella pneumoniae NOT DETECTED NOT DETECTED Final   Proteus species NOT DETECTED NOT DETECTED Final   Serratia marcescens NOT DETECTED NOT DETECTED Final   Haemophilus influenzae NOT DETECTED NOT DETECTED Final  Neisseria meningitidis NOT DETECTED NOT DETECTED Final   Pseudomonas aeruginosa NOT DETECTED NOT DETECTED Final   Candida albicans NOT DETECTED NOT DETECTED Final   Candida glabrata NOT DETECTED NOT DETECTED Final   Candida krusei NOT DETECTED NOT DETECTED Final   Candida parapsilosis NOT DETECTED NOT DETECTED Final   Candida tropicalis NOT DETECTED NOT DETECTED Final    Comment: Performed at [facility], [place] [address] [place], [place] [address]  Blood culture (routine x 2)     Status: None (Preliminary result)   Collection Time: 12/22/17  5:11 PM  Result Value Ref Range Status   Specimen Description BLOOD RIGHT HAND  Final   Special Requests   Final    BOTTLES DRAWN AEROBIC AND ANAEROBIC Blood Culture adequate volume   Culture   Final    NO GROWTH 3 DAYS Performed at [facility], [place] [address] [place], [place] [address]    Report Status PENDING  Incomplete  Urine culture     Status: Abnormal   Collection Time: 12/22/17  8:40 PM  Result Value Ref Range Status   Specimen Description URINE, RANDOM  Final   Special Requests   Final    NONE Performed at [facility], [place] [address], [place], [place] [address]    Culture MULTIPLE SPECIES PRESENT, SUGGEST RECOLLECTION (A)  Final   Report Status 12/24/2017 FINAL  Final  Respiratory Panel by PCR     Status: None   Collection Time: 12/22/17  9:33 PM  Result Value Ref Range Status   Adenovirus NOT DETECTED NOT DETECTED Final   Coronavirus 229E NOT DETECTED NOT DETECTED Final   Coronavirus HKU1 NOT DETECTED NOT DETECTED Final   Coronavirus NL63 NOT DETECTED NOT DETECTED Final   Coronavirus OC43 NOT DETECTED NOT DETECTED Final   Metapneumovirus NOT DETECTED NOT DETECTED Final   Rhinovirus / Enterovirus NOT DETECTED NOT DETECTED Final   Influenza A NOT DETECTED NOT DETECTED Final   Influenza B NOT DETECTED NOT DETECTED Final   Parainfluenza Virus 1 NOT DETECTED NOT DETECTED Final   Parainfluenza Virus 2 NOT DETECTED NOT DETECTED Final   Parainfluenza Virus 3 NOT DETECTED NOT DETECTED Final   Parainfluenza Virus 4 NOT DETECTED NOT DETECTED Final   Respiratory Syncytial Virus NOT DETECTED NOT DETECTED Final   Bordetella pertussis NOT DETECTED NOT DETECTED Final   Chlamydophila pneumoniae NOT DETECTED NOT DETECTED Final   Mycoplasma pneumoniae NOT DETECTED NOT DETECTED Final    Comment: Performed at [facility], [place] [address], [place], [place] [address]  MRSA PCR Screening     Status: None   Collection Time: 12/22/17  9:33 PM  Result Value Ref Range Status   MRSA by PCR NEGATIVE NEGATIVE Final    Comment:        The GeneXpert MRSA Assay (FDA approved for NASAL specimens only), is one component of a comprehensive MRSA colonization surveillance program. It is not intended to diagnose  MRSA infection nor to guide or monitor treatment for MRSA infections. Performed at [facility], [place] [address] [place], [place] [address]   Culture, blood (routine x 2)     Status: None (Preliminary result)   Collection Time: 12/24/17  5:31 AM  Result Value Ref Range Status   Specimen Description BLOOD RIGHT HAND  Final   Special Requests   Final    BOTTLES DRAWN AEROBIC ONLY Blood Culture results may not be optimal due to an inadequate  volume of blood received in culture bottles   Culture   Final    NO GROWTH 1 DAY Performed at [facility], [place] [address] [place], [place] [address]    Report Status PENDING  Incomplete  Culture, blood (routine x 2)     Status: None (Preliminary result)   Collection Time: 12/24/17  5:31 AM  Result Value Ref Range Status   Specimen Description BLOOD RIGHT HAND  Final   Special Requests   Final    BOTTLES DRAWN AEROBIC ONLY Blood Culture results may not be optimal due to an inadequate volume of blood received in culture bottles   Culture   Final    NO GROWTH 1 DAY Performed at [facility], [place] [address], [place], [place] [address]    Report Status PENDING  Incomplete         Radiology Studies: No results found.      Scheduled Meds: . apixaban  2.5 mg Oral BID  . carbidopa-levodopa  1 tablet Oral TID  . ferrous sulfate  325 mg Oral Q breakfast  . fluticasone  2 spray Each Nare Daily  . Melatonin  3 mg Oral QHS  . metoprolol tartrate  25 mg Oral BID  . multivitamin  1 tablet Oral Daily  . omega-3 acid ethyl esters  1 g Oral BID  . pantoprazole  80 mg Oral Q1200  . simvastatin  20 mg Oral QPM  . tamsulosin  0.4 mg Oral QHS   Continuous Infusions: . ampicillin-sulbactam (UNASYN) IV 3 g (12/25/17 0448)     LOS: 3 days    Time spent: 27min    Domenic Polite, MD [facility] Page via [email or web address], password [email or web address] After 7PM please contact night-coverage  12/25/2017, 9:52 AM

## 2017-12-26 LAB — CBC
HCT: 41.9 % (ref 39.0–52.0)
Hemoglobin: 13.3 g/dL (ref 13.0–17.0)
MCH: 27.1 pg (ref 26.0–34.0)
MCHC: 31.7 g/dL (ref 30.0–36.0)
MCV: 85.3 fL (ref 78.0–100.0)
PLATELETS: 184 10*3/uL (ref 150–400)
RBC: 4.91 MIL/uL (ref 4.22–5.81)
RDW: 14.4 % (ref 11.5–15.5)
WBC: 11.3 10*3/uL — ABNORMAL HIGH (ref 4.0–10.5)

## 2017-12-26 LAB — BASIC METABOLIC PANEL
ANION GAP: 14 (ref 5–15)
BUN: 13 mg/dL (ref 8–23)
CALCIUM: 8.6 mg/dL — AB (ref 8.9–10.3)
CO2: 20 mmol/L — ABNORMAL LOW (ref 22–32)
Chloride: 107 mmol/L (ref 98–111)
Creatinine, Ser: 1.49 mg/dL — ABNORMAL HIGH (ref 0.61–1.24)
GFR, EST AFRICAN AMERICAN: 47 mL/min — AB (ref >60)
GFR, EST NON AFRICAN AMERICAN: 40 mL/min — AB (ref >60)
Glucose, Bld: 98 mg/dL (ref 70–99)
Potassium: 3.4 mmol/L — ABNORMAL LOW (ref 3.5–5.1)
SODIUM: 141 mmol/L (ref 135–145)

## 2017-12-26 MED ORDER — POTASSIUM CHLORIDE CRYS ER 20 MEQ PO TBCR
40.0000 meq | EXTENDED_RELEASE_TABLET | Freq: Once | ORAL | Status: AC
  Administered 2017-12-26: 40 meq via ORAL

## 2017-12-26 MED ORDER — POTASSIUM CHLORIDE CRYS ER 20 MEQ PO TBCR: 40.0000 meq | EXTENDED_RELEASE_TABLET | Freq: Every day | ORAL | Status: DC

## 2017-12-26 MED ORDER — TRAMADOL HCL 50 MG PO TABS: 50.0000 mg | ORAL_TABLET | Freq: Three times a day (TID) | ORAL | 0 refills | Status: DC | PRN

## 2017-12-26 MED ORDER — ACETAMINOPHEN 325 MG PO TABS: 650.0000 mg | ORAL_TABLET | Freq: Four times a day (QID) | ORAL | Status: DC | PRN

## 2017-12-26 MED ORDER — AMOXICILLIN-POT CLAVULANATE 875-125 MG PO TABS: 1.0000 | ORAL_TABLET | Freq: Two times a day (BID) | ORAL | 0 refills | Status: AC

## 2017-12-26 NOTE — Discharge Summary (Signed)
Physician Discharge Summary  Jason [name] PPI:[id number] DOB: 07-28-1927 DOA: 12/22/2017  PCP: [name], MD  Admit date: 12/22/2017 Discharge date: 12/26/2017  Time spent: 45 minutes  Recommendations for Outpatient Follow-up:  1. PCP in 1 week, continue goals of care discussions 2. Needs palliative care follow-up at SNF 3. SLP follow-up at SNF   Discharge Diagnoses:  Aspiration pneumonia Dysphagia   Hyperlipidemia   GERD (gastroesophageal reflux disease)   PAF (paroxysmal atrial fibrillation) (HCC)   Acute renal failure superimposed on stage 3 chronic kidney disease (HCC)   CVA (cerebral vascular accident) ([place])   Parkinsonism ([place])   Sepsis ([place])   Chronic diastolic CHF (congestive heart failure) ([place])   Aspiration pneumonia ([place])   Diarrhea   Discharge Condition: Stable  Diet recommendation: Heart healthy diet, whole meds with pure  [facility] Weights   12/22/17 2100 12/23/17 0655  Weight: 94.8 kg 93.8 kg    History of present illness:  NarrativeJimmy W Wilsonis a 84 y.o.malewith medical history significant ofstroke, Parkinson's disease, hyperlipidemia, GERD,dCHF, PAF on Eliquis, CKD-3, remote bladder cancer (excision only), non-Hodgkin lymphoma (s/p ofchemo and radiation therapy, not on chemo currently, following up withDr. Beryle Beams), presents with cough, shortness of breath, fever and chills, found to have aspiration pneumonia.  Hospital Course:   Aspiration pneumonia -long-standing history of dysphagia -Clinically improving, started on broad-spectrum antibiotics initially, subsequently transitioned to IV Unasyn  -blood cultures grew enterococcus, infectious disease was consulted, repeat blood cultures showed no growth, per ID recommendations transition to oral antibiotics to complete 7-day course -SLP following, status post bedside evaluation noted to have oropharyngeal dysphagia with signs and symptoms of overt aspiration, moderate risk, remains on  a regular diet with thin liquids, discussed with the daughter, long-standing history of dysphagia, aspiration risk will persist. -Given this condition we discussed CODE STATUS and DNR recommended, daughter agreeable -Physical therapy evaluation completed SNF for rehabilitation recommended and planned -Also recommend palliative care follow-up at SNF   Paroxysmal atrial fibrillation -In sinus rhythm at this time, -Continue metoprolol and anticoagulation with Eliquis   history of CVA -Continue eliquis and statin   History of Parkinson's disease -Continue Sinemet   Chronic diastolic CHF - Last echo with preserved ejection fraction, -Clinically appears euvolemic -resumed oral diuretics along with potassium   Mild acute kidney injury -due to above illness, sepsis and diuretics -Improved and stable  CODE STATUS changed to DO NOT RESUSCITATE  Discharge Exam: Vitals:   12/26/17 0004 12/26/17 0818  BP: 123/86 (!) 151/93  Pulse: 87 95  Resp: 16   Temp: 98.1 F (36.7 C) 98.8 F (37.1 C)  SpO2: 92% 96%    General: AAO x self and place only Cardiovascular: S1S2/RRR Respiratory: ronchi at bases  Discharge Instructions   Discharge Instructions    Increase activity slowly   Complete by:  As directed      Allergies as of 12/26/2017   No Known Allergies     Medication List    STOP taking these medications   doxycycline 100 MG tablet Commonly known as:  ADOXA   senna 8.6 MG tablet Commonly known as:  SENOKOT     TAKE these medications   acetaminophen 325 MG tablet Commonly known as:  TYLENOL Take 2 tablets (650 mg total) by mouth every 6 (six) hours as needed for fever, headache or moderate pain.   amoxicillin-clavulanate 875-125 MG tablet Commonly known as:  AUGMENTIN Take 1 tablet by mouth every 12 (twelve) hours for 4 days.  benzonatate 100 MG capsule Commonly known as:  TESSALON Take 100 mg by mouth every 8 (eight) hours as needed for cough.    carbidopa-levodopa 25-100 MG tablet Commonly known as:  SINEMET IR Take 1 tablet by mouth 3 (three) times daily. Take at 8am, 12noon, 5pm What changed:  Another medication with the same name was removed. Continue taking this medication, and follow the directions you see here.   COLACE PO Take 100 mg by mouth at bedtime.   ELIQUIS 5 MG Tabs tablet Generic drug:  apixaban Take 5 mg by mouth 2 (two) times daily.   esomeprazole 40 MG capsule Commonly known as:  NEXIUM TAKE 1 CAPSULE (40 MG TOTAL) BY MOUTH 2 (TWO) TIMES DAILY BEFORE A MEAL.   ferrous sulfate 325 (65 FE) MG tablet Take 325 mg by mouth daily with breakfast.   fish oil-omega-3 fatty acids 1000 MG capsule Take 1 g by mouth 2 (two) times daily.   fluticasone 50 MCG/ACT nasal spray Commonly known as:  FLONASE Place 2 sprays into both nostrils daily.   furosemide 40 MG tablet Commonly known as:  LASIX Take 40 mg by mouth 2 (two) times daily.   ICAPS LUTEIN-ZEAXANTHIN PO Take 1 tablet by mouth 2 (two) times daily.   ICAPS MV PO Take 1 capsule by mouth 2 (two) times daily.   loperamide 2 MG tablet Commonly known as:  IMODIUM A-D Take 4 mg by mouth 4 (four) times daily as needed for diarrhea or loose stools.   Melatonin 3 MG Tabs Take 3 mg by mouth at bedtime.   metoprolol tartrate 25 MG tablet Commonly known as:  LOPRESSOR Take 25 mg by mouth 2 (two) times daily.   potassium chloride SA 20 MEQ tablet Commonly known as:  K-DUR,KLOR-CON Take 2 tablets (40 mEq total) by mouth daily. Start taking on:  12/27/2017   senna-docusate 8.6-50 MG tablet Commonly known as:  Senokot-S Take 1 tablet by mouth daily as needed for mild constipation.   simvastatin 20 MG tablet Commonly known as:  ZOCOR Take 20 mg by mouth every evening.   SYSTANE 0.4-0.3 % Soln Generic drug:  Polyethyl Glycol-Propyl Glycol Place 1 drop into both eyes daily as needed (for dry eyes).   tamsulosin 0.4 MG Caps capsule Commonly known as:   FLOMAX Take 0.4 mg by mouth at bedtime.   traMADol 50 MG tablet Commonly known as:  ULTRAM Take 1 tablet (50 mg total) by mouth every 8 (eight) hours as needed for moderate pain.      No Known Allergies Follow-up Information    [name], MD. Schedule an appointment as soon as possible for a visit in 1 week(s).   Specialty:  Family Medicine Contact information: [address] [place] [address] [phone]            The results of significant diagnostics from this hospitalization (including imaging, microbiology, ancillary and laboratory) are listed below for reference.    Significant Diagnostic Studies: Dg Chest 2 View  Result Date: 12/22/2017 CLINICAL DATA:  Abdominal pain and nausea.  Dyspnea. EXAM: CHEST - 2 VIEW COMPARISON:  12/07/2017 FINDINGS: The patient's chin obscures the left lung apex. Stable cardiomegaly with interstitial edema. Hazy opacity at the left lung base compatible with a layering left small to moderate effusion similar in appearance to prior. Superimposed pneumonia would be difficult to entirely exclude. Probable adjacent atelectasis at the left lung base. Degenerative changes are present about both shoulders and dorsal spine. IMPRESSION:  1. Cardiomegaly with mild CHF. 2. Small to moderate layering left effusion is believed to account for the hazy opacity at the left lung base. Superimposed pneumonia and/or atelectasis would be difficult to entirely exclude. Electronically Signed   By: Ashley Royalty M.D.   On: 12/22/2017 18:52   Dg Chest 2 View  Result Date: 12/08/2017 CLINICAL DATA:  Cough. EXAM: CHEST - 2 VIEW COMPARISON:  07/03/2017 FINDINGS: Cardiac silhouette is mildly enlarged. No mediastinal or masses. No convincing adenopathy. Left pleural effusion and associated left lung base opacity is similar to the prior exam. Lung base opacity is likely atelectasis, although pneumonia as a component is not excluded. There are reticular type  opacities in the right mid and lower lung consistent with scarring similar to the prior study. There is no evidence of pulmonary edema. No right pleural effusion. No pneumothorax. Skeletal structures are demineralized but grossly intact. IMPRESSION: 1. Left lung base opacity consistent with a combination pleural fluid and atelectasis or possibly pneumonia. This is similar to the most recent prior exam. 2. No other evidence of pneumonia.  No pulmonary edema. Electronically Signed   By: Lajean Manes M.D.   On: 12/08/2017 08:00    Microbiology: Recent Results (from the past 240 hour(s))  Blood culture (routine x 2)     Status: Abnormal   Collection Time: 12/22/17  4:53 PM  Result Value Ref Range Status   Specimen Description BLOOD RIGHT ANTECUBITAL  Final   Special Requests   Final    BOTTLES DRAWN AEROBIC AND ANAEROBIC Blood Culture adequate volume   Culture  Setup Time   Final    GRAM POSITIVE COCCI IN CHAINS IN BOTH AEROBIC AND ANAEROBIC BOTTLES CRITICAL RESULT CALLED TO, READ BACK BY AND VERIFIED WITH: RJiles Crocker PHARMD, AT 1116 12/23/17 BY D. VANHOOK Performed at [facility], [place] [address] [place], [place] [address]    Culture ENTEROCOCCUS FAECALIS (A)  Final   Report Status 12/25/2017 FINAL  Final   Organism ID, Bacteria ENTEROCOCCUS FAECALIS  Final      Susceptibility   Enterococcus faecalis - MIC*    AMPICILLIN <=2 SENSITIVE Sensitive     VANCOMYCIN 1 SENSITIVE Sensitive     GENTAMICIN SYNERGY SENSITIVE Sensitive     * ENTEROCOCCUS FAECALIS  Blood Culture ID Panel (Reflexed)     Status: Abnormal   Collection Time: 12/22/17  4:53 PM  Result Value Ref Range Status   Enterococcus species DETECTED (A) NOT DETECTED Final    Comment: CRITICAL RESULT CALLED TO, READ BACK BY AND VERIFIED WITH: R. RUMBARGER PHARMD, AT 1116 12/23/17 BY D. VANHOOK    Vancomycin resistance NOT DETECTED NOT DETECTED Final   Listeria monocytogenes NOT DETECTED NOT DETECTED Final   Staphylococcus  species NOT DETECTED NOT DETECTED Final   Staphylococcus aureus NOT DETECTED NOT DETECTED Final   Streptococcus species NOT DETECTED NOT DETECTED Final   Streptococcus agalactiae NOT DETECTED NOT DETECTED Final   Streptococcus pneumoniae NOT DETECTED NOT DETECTED Final   Streptococcus pyogenes NOT DETECTED NOT DETECTED Final   Acinetobacter baumannii NOT DETECTED NOT DETECTED Final   Enterobacteriaceae species NOT DETECTED NOT DETECTED Final   Enterobacter cloacae complex NOT DETECTED NOT DETECTED Final   Escherichia coli NOT DETECTED NOT DETECTED Final   Klebsiella oxytoca NOT DETECTED NOT DETECTED Final   Klebsiella pneumoniae NOT DETECTED NOT DETECTED Final   Proteus species NOT DETECTED NOT DETECTED Final   Serratia marcescens NOT DETECTED NOT DETECTED Final   Haemophilus  influenzae NOT DETECTED NOT DETECTED Final   Neisseria meningitidis NOT DETECTED NOT DETECTED Final   Pseudomonas aeruginosa NOT DETECTED NOT DETECTED Final   Candida albicans NOT DETECTED NOT DETECTED Final   Candida glabrata NOT DETECTED NOT DETECTED Final   Candida krusei NOT DETECTED NOT DETECTED Final   Candida parapsilosis NOT DETECTED NOT DETECTED Final   Candida tropicalis NOT DETECTED NOT DETECTED Final    Comment: Performed at [facility], [place] [address], [place], [place] [address]  Blood culture (routine x 2)     Status: None (Preliminary result)   Collection Time: 12/22/17  5:11 PM  Result Value Ref Range Status   Specimen Description BLOOD RIGHT HAND  Final   Special Requests   Final    BOTTLES DRAWN AEROBIC AND ANAEROBIC Blood Culture adequate volume   Culture   Final    NO GROWTH 4 DAYS Performed at [facility], [place] [address], [place], [place] [address]    Report Status PENDING  Incomplete  Urine culture     Status: Abnormal   Collection Time: 12/22/17  8:40 PM  Result Value Ref Range Status   Specimen Description URINE, RANDOM  Final   Special Requests   Final     NONE Performed at [facility], [place] [address] [place], [place] [address]    Culture MULTIPLE SPECIES PRESENT, SUGGEST RECOLLECTION (A)  Final   Report Status 12/24/2017 FINAL  Final  Respiratory Panel by PCR     Status: None   Collection Time: 12/22/17  9:33 PM  Result Value Ref Range Status   Adenovirus NOT DETECTED NOT DETECTED Final   Coronavirus 229E NOT DETECTED NOT DETECTED Final   Coronavirus HKU1 NOT DETECTED NOT DETECTED Final   Coronavirus NL63 NOT DETECTED NOT DETECTED Final   Coronavirus OC43 NOT DETECTED NOT DETECTED Final   Metapneumovirus NOT DETECTED NOT DETECTED Final   Rhinovirus / Enterovirus NOT DETECTED NOT DETECTED Final   Influenza A NOT DETECTED NOT DETECTED Final   Influenza B NOT DETECTED NOT DETECTED Final   Parainfluenza Virus 1 NOT DETECTED NOT DETECTED Final   Parainfluenza Virus 2 NOT DETECTED NOT DETECTED Final   Parainfluenza Virus 3 NOT DETECTED NOT DETECTED Final   Parainfluenza Virus 4 NOT DETECTED NOT DETECTED Final   Respiratory Syncytial Virus NOT DETECTED NOT DETECTED Final   Bordetella pertussis NOT DETECTED NOT DETECTED Final   Chlamydophila pneumoniae NOT DETECTED NOT DETECTED Final   Mycoplasma pneumoniae NOT DETECTED NOT DETECTED Final    Comment: Performed at [facility], [place] [address] [place], [place] [address]  MRSA PCR Screening     Status: None   Collection Time: 12/22/17  9:33 PM  Result Value Ref Range Status   MRSA by PCR NEGATIVE NEGATIVE Final    Comment:        The GeneXpert MRSA Assay (FDA approved for NASAL specimens only), is one component of a comprehensive MRSA colonization surveillance program. It is not intended to diagnose MRSA infection nor to guide or monitor treatment for MRSA infections. Performed at [facility], [place] [address], [place],  [address]   Culture, blood (routine x 2)     Status: None (Preliminary result)   Collection Time: 12/24/17  5:31 AM  Result Value Ref Range  Status   Specimen Description BLOOD RIGHT HAND  Final   Special Requests   Final    BOTTLES DRAWN AEROBIC ONLY Blood Culture results  may not be optimal due to an inadequate volume of blood received in culture bottles   Culture   Final    NO GROWTH 2 DAYS Performed at [facility], [place] [address] [place], [place] [address]    Report Status PENDING  Incomplete  Culture, blood (routine x 2)     Status: None (Preliminary result)   Collection Time: 12/24/17  5:31 AM  Result Value Ref Range Status   Specimen Description BLOOD RIGHT HAND  Final   Special Requests   Final    BOTTLES DRAWN AEROBIC ONLY Blood Culture results may not be optimal due to an inadequate volume of blood received in culture bottles   Culture   Final    NO GROWTH 2 DAYS Performed at [facility], [place] [address] [place], [place] [address]    Report Status PENDING  Incomplete     Labs: Basic Metabolic Panel: Recent Labs  Lab 12/22/17 1653 12/24/17 0531 12/25/17 0232 12/26/17 0258  NA 141 140 140 141  K 4.2 3.4* 3.4* 3.4*  CL 101 105 107 107  CO2 26 26 22  20*  GLUCOSE 103* 91 84 98  BUN 19 17 15 13   CREATININE 1.59* 1.48* 1.43* 1.49*  CALCIUM 8.7* 8.3* 8.3* 8.6*   Liver Function Tests: Recent Labs  Lab 12/22/17 1653  AST 21  ALT 6  ALKPHOS 35*  BILITOT 0.8  PROT 8.4*  ALBUMIN 2.9*   No results for input(s): LIPASE, AMYLASE in the last 168 hours. No results for input(s): AMMONIA in the last 168 hours. CBC: Recent Labs  Lab 12/22/17 1653 12/24/17 0531 12/25/17 0232 12/26/17 0258  WBC 13.7* 10.8* 9.7 11.3*  HGB 13.3 12.6* 12.7* 13.3  HCT 43.9 39.9 40.1 41.9  MCV 88.2 86.4 86.1 85.3  PLT 192 155 159 184   Cardiac Enzymes: No results for input(s): CKTOTAL, CKMB, CKMBINDEX, TROPONINI in the last 168 hours. BNP: BNP (last 3 results) Recent Labs    12/22/17 2147  BNP 443.8*    ProBNP (last 3 results) No results for input(s): PROBNP in the last 8760 hours.  CBG: No  results for input(s): GLUCAP in the last 168 hours.     Signed:  Domenic Polite MD.  [facility] 12/26/2017, 1:13 PM

## 2017-12-26 NOTE — Evaluation (Signed)
Physical Therapy Evaluation Patient Details Name: Jason [name] MRN: [id number] DOB: March 26, 1928 Today's Date: 12/26/2017   History of Present Illness  PATIENT ADMITTED TO [facility]. PNT  PMH: FALLS, RIIB FX T9-10, NON HODDDKINS LYMPHOMA,, BLADDER CA, SKIN CA, PROSTATE CA, CVA WITH RESIDUAL SWALLOWING DEFICITS, L TKA, AFIB, MACULAR DEGENERATION  Clinical Impression  PTA pt living in ALF and able to ambulate with RW around campus and to meals. Pt currently limited by decreased cognition, as well as decreased strength and endurance. Pt currently requires modAx2 for bed mobility, transfers and lateral stepping to recliner. Pt with increased difficulty keeping eyes open during session despite multimodal cues. PT recommends SNF level rehab at discharge. PT will continue to follow acutely.      Follow Up Recommendations SNF    Equipment Recommendations  Other (comment)(TBD at next venue)    Recommendations for Other Services       Precautions / Restrictions Precautions Precautions: Fall Restrictions Weight Bearing Restrictions: No      Mobility  Bed Mobility Overal bed mobility: Needs Assistance Bed Mobility: Supine to Sit     Supine to sit: Mod assist;+2 for physical assistance        Transfers Overall transfer level: Needs assistance Equipment used: 2 person hand held assist Transfers: Sit to/from Stand Sit to Stand: Mod assist;+2 physical assistance;From elevated surface         General transfer comment: modAx2 for powerup and steadying, posterior lean, requiring multimodal cues to correct  Ambulation/Gait Ambulation/Gait assistance: Mod assist;+2 safety/equipment Gait Distance (Feet): 2 Feet Assistive device: 2 person hand held assist Gait Pattern/deviations: Step-to pattern;Shuffle;Decreased step length - right;Decreased step length - left Gait velocity: slowed Gait velocity interpretation: <1.31 ft/sec, indicative of household ambulator General Gait  Details: modAx2 for lateral stepping from bed to chair, maximal verbal cuing for sequencing      Balance Overall balance assessment: Needs assistance Sitting-balance support: Feet supported;Bilateral upper extremity supported Sitting balance-Leahy Scale: Poor Sitting balance - Comments: vc for correction of posterior lean  Postural control: Posterior lean Standing balance support: Bilateral upper extremity supported Standing balance-Leahy Scale: Zero Standing balance comment: requires therapist support                             Pertinent Vitals/Pain Pain Assessment: No/denies pain    Home Living Family/patient expects to be discharged to:: Skilled nursing facility                 Additional Comments: PATIENT LIVES AT [facility]    Prior Function Level of [place]: Needs assistance   Gait / Transfers Assistance Needed: PATIENT WOULD AMB WITH WALKER IN FACILITY AT MOD I LEVEL  ADL's / Homemaking Assistance Needed: PATIENT HAD ASSIST WTIH SHOWERS.TRANSFERS.  PATIENT WAS ABLE TO TAKE SINK BATH AND DRESS SELF AT MOD I LEVEL. PATIENT WOULD PREPARE HIS OWN BREAKFESTOF CEREAL IN THE MORNING AND WOULD WALK TO [facility] ROOM FOR LUNCH AND DINNER.  Comments: (RW)     Hand Dominance   Dominant Hand: Right    Extremity/Trunk Assessment   Upper Extremity Assessment Upper Extremity Assessment: Difficult to assess due to impaired cognition    Lower Extremity Assessment Lower Extremity Assessment: Difficult to assess due to impaired cognition    Cervical / Trunk Assessment Cervical / Trunk Assessment: Kyphotic  Communication      Cognition Arousal/Alertness: Lethargic Behavior During Therapy: Flat affect Overall Cognitive Status: Impaired/Different from  baseline Area of Impairment: Orientation;Attention;Memory;Following commands;Safety/judgement                 Orientation Level: Person Current Attention Level: Focused Memory: Decreased  short-term memory Following Commands: Follows one step commands inconsistently Safety/Judgement: Decreased awareness of safety;Decreased awareness of deficits            General Comments General comments (skin integrity, edema, etc.): VSS, daughter present and provided PLOF, and housing information     Assessment/Plan    PT Assessment Patient needs continued PT services  PT Problem List Decreased strength;Decreased range of motion;Decreased activity tolerance;Decreased balance;Decreased mobility;Decreased cognition;Decreased coordination;Decreased safety awareness       PT Treatment Interventions DME instruction;Gait training;Functional mobility training;Therapeutic activities;Therapeutic exercise;Balance training;Cognitive remediation;Patient/family education    PT Goals (Current goals can be found in the Care Plan section)  Acute Rehab PT Goals Patient Stated Goal: none stated PT Goal Formulation: With patient Time For Goal Achievement: 01/09/18 Potential to Achieve Goals: Fair    Frequency Min 2X/week    AM-PAC PT "6 Clicks" Daily Activity  Outcome Measure Difficulty turning over in bed (including adjusting bedclothes, sheets and blankets)?: Unable Difficulty moving from lying on back to sitting on the side of the bed? : Unable Difficulty sitting down on and standing up from a chair with arms (e.g., wheelchair, bedside commode, etc,.)?: Unable Help needed moving to and from a bed to chair (including a wheelchair)?: A Lot Help needed walking in hospital room?: Total Help needed climbing 3-5 steps with a railing? : Total 6 Click Score: 7    End of Session Equipment Utilized During Treatment: Gait belt Activity Tolerance: Patient limited by fatigue Patient left: in chair;with call bell/phone within reach;with chair alarm set;with family/visitor present Nurse Communication: Mobility status PT Visit Diagnosis: Unsteadiness on feet (R26.81);Other abnormalities of gait and  mobility (R26.89);Muscle weakness (generalized) (M62.81);Difficulty in walking, not elsewhere classified (R26.2)    Time: 4818-5631 PT Time Calculation (min) (ACUTE ONLY): 14 min   Charges:   PT Evaluation $PT Eval Moderate Complexity: 1 Mod          Lakesha Levinson B. Migdalia Dk PT, DPT Acute Rehabilitation Services Pager [phone] Office [phone]   [place] 12/26/2017, 1:11 PM

## 2017-12-26 NOTE — Clinical Social Work Note (Signed)
Clinical Social Work Assessment  Patient Details  Name: Jason [name] MRN: [id number] Date of Birth: 1927-05-19  Date of referral:  12/26/17               Reason for consult:  Facility Placement                Permission sought to share information with:  Facility Sport and exercise psychologist, Family Supports Permission granted to share information::  Yes, Verbal Permission Granted  Name::     Peggye Form   Agency::  SNF[facility]  Relationship::  daughter  Contact Information:  [phone]  Housing/Transportation Living arrangements for the past 2 months:  Independent Living Facility([facility] ) Source of Information:  Adult Children Patient Interpreter Needed:  None Criminal Activity/Legal Involvement Pertinent to Current Situation/Hospitalization:  No - Comment as needed Significant Relationships:  Adult Children Lives with:  Facility Resident Do you feel safe going back to the place where you live?  No Need for family participation in patient care:  Yes (Comment)  Care giving concerns:  CSW received consult for discharge needs. CSW spoke with patient's daughter  regarding PT recommendation of SNF placement at time of discharge. Patient's daughter states her father is a resident at [facility] and agrees he needs rehab before returning back home.     Social Worker assessment / plan: CSW spoke with patient's daughter concerning possibility of rehab at [facility] before returning home.   Employment status:  Retired Forensic scientist:  Medicare PT Recommendations:  [place] / Referral to community resources:  [place]  Patient/Family's Response to care:  Patient's family  recognizes need for rehab before he can return home and is agreeable to a SNF in [place]. Patient reported preference for [facility].  Patient/Family's Understanding of and Emotional Response to Diagnosis, Current Treatment, and Prognosis: Patient's family is  realistic regarding therapy needs and expressed being hopeful for SNF placement. Patient's family expressed understanding of CSW role and discharge process as well as medical condition. No questions/concerns about plan or treatment.   Emotional Assessment Appearance:  Appears stated age Attitude/Demeanor/Rapport:  Unable to Assess(patient was sleep ) Affect (typically observed):  Unable to Assess(pateint was sleep) Orientation:  Oriented to Self Alcohol / Substance use:  Not Applicable Psych involvement (Current and /or in the community):  No (Comment)  Discharge Needs  Concerns to be addressed:  Care Coordination Readmission within the last 30 days:  No Current discharge risk:  Dependent with Mobility Barriers to Discharge:  Continued Medical Work up   [employer], [place] 12/26/2017, 4:37 PM

## 2017-12-26 NOTE — NC FL2 (Signed)
[place] LEVEL OF CARE SCREENING TOOL     IDENTIFICATION  Patient Name: Jason [name] Birthdate: 17-Oct-1927 Sex: male Admission Date (Current Location): 12/22/2017  [facility] and [place] Number:  Herbalist and Address:  [facility], [place] [address] [place], [place] [address]      Provider Number: [id number]  Attending Physician Name and Address:  Domenic Polite, MD  Relative Name and Phone Number:  Peggye Form [phone]    Current Level of Care: Hospital Recommended Level of Care: [place] Prior Approval Number:    Date Approved/Denied:   PASRR Number: [id number]  Discharge Plan: SNF    Current Diagnoses: Patient Active Problem List   Diagnosis Date Noted  . HCAP (healthcare-associated pneumonia) 12/22/2017  . Sepsis ([place]) 12/22/2017  . Chronic diastolic CHF (congestive heart failure) ([place]) 12/22/2017  . Aspiration pneumonia ([place]) 12/22/2017  . Diarrhea 12/22/2017  . Fall at home 10/19/2016  . Gait abnormality 09/05/2016  . Parkinsonism ([place]) 09/05/2016  . CVA (cerebral vascular accident) ([place]) 07/01/2016  . Dysarthria   . History of cancer   . TIA (transient ischemic attack) 06/30/2016  . Hemiplegia affecting dominant side ([place]) 06/30/2016  . History of bladder cancer   . Dysphagia   . Hyperglycemia   . Acute renal failure superimposed on stage 3 chronic kidney disease ([place])   . Acute blood loss anemia   . Leukocytosis   . Weakness   . Community acquired pneumonia 06/16/2016  . Unresponsiveness   . CAP (community acquired pneumonia) 06/15/2016  . PAF (paroxysmal atrial fibrillation) ([place]) 06/15/2016  . Pressure injury of skin 06/15/2016  . Lobar pneumonia ([place])   . Acute congestive heart failure ([place])   . Primary osteoarthritis of left knee 07/30/2015  . BPH (benign prostatic hyperplasia) 07/16/2012  . Multiple nodules of lung 09/16/2011  . GERD (gastroesophageal reflux disease)  06/14/2011  . Constipation, chronic 06/14/2011  . NHL (non-Hodgkin's lymphoma) ([place]) 01/03/2010  . Malignant neoplasm of bladder ([place]) 05/28/2007  . Hyperlipidemia 05/28/2007  . HIATAL HERNIA 05/28/2007  . BARRETT'S ESOPHAGUS, HX OF 05/28/2007    Orientation RESPIRATION BLADDER Height & Weight     Self  Normal External catheter Weight: 206 lb 12.7 oz (93.8 kg) Height:  5\' 10"  (177.8 cm)  BEHAVIORAL SYMPTOMS/MOOD NEUROLOGICAL BOWEL NUTRITION STATUS      Continent Diet(Please see discharge summary)  AMBULATORY STATUS COMMUNICATION OF NEEDS Skin     Verbally Normal                       Personal Care Assistance Level of Assistance  Bathing, Feeding, Dressing Bathing Assistance: Maximum assistance Feeding assistance: Limited assistance Dressing Assistance: Maximum assistance     Functional Limitations Info  Sight, Hearing, Speech Sight Info: Adequate Hearing Info: Adequate Speech Info: Adequate    SPECIAL CARE FACTORS FREQUENCY  PT (By licensed PT), OT (By licensed OT)     PT Frequency: 5x per wk OT Frequency: 5x per wk            Contractures Contractures Info: Not present    Additional Factors Info  Code Status, Allergies Code Status Info: DNR Allergies Info: NKA           Current Medications (12/26/2017):  This is the current hospital active medication list Current Facility-Administered Medications  Medication Dose Route Frequency Provider Last Rate Last Dose  . acetaminophen (TYLENOL) tablet 650 mg  650 mg Oral Q6H  PRN Ivor Costa, MD      . albuterol (PROVENTIL) (2.5 MG/3ML) 0.083% nebulizer solution 2.5 mg  2.5 mg Nebulization Q4H PRN Ivor Costa, MD      . amoxicillin-clavulanate (AUGMENTIN) 875-125 MG per tablet 1 tablet  1 tablet Oral Q12H Domenic Polite, MD   1 tablet at 12/26/17 [phone]  . apixaban (ELIQUIS) tablet 5 mg  5 mg Oral BID Alvira Philips, [place]   5 mg at 12/26/17 1010  . carbidopa-levodopa (SINEMET IR) 25-100 MG per tablet immediate  release 1 tablet  1 tablet Oral TID Ivor Costa, MD   1 tablet at 12/26/17 1248  . dextromethorphan-guaiFENesin (MUCINEX DM) 30-600 MG per 12 hr tablet 1 tablet  1 tablet Oral BID PRN Ivor Costa, MD      . ferrous sulfate tablet 325 mg  325 mg Oral Q breakfast Ivor Costa, MD   325 mg at 12/26/17 1010  . fluticasone (FLONASE) 50 MCG/ACT nasal spray 2 spray  2 spray Each Nare Daily Ivor Costa, MD   2 spray at 12/26/17 1011  . furosemide (LASIX) tablet 40 mg  40 mg Oral Daily Domenic Polite, MD   40 mg at 12/26/17 1010  . Melatonin TABS 3 mg  3 mg Oral QHS Ivor Costa, MD   3 mg at 12/25/17 2145  . metoprolol tartrate (LOPRESSOR) tablet 25 mg  25 mg Oral BID Ivor Costa, MD   25 mg at 12/26/17 1009  . multivitamin (PROSIGHT) tablet 1 tablet  1 tablet Oral Daily Ivor Costa, MD   1 tablet at 12/26/17 1010  . omega-3 acid ethyl esters (LOVAZA) capsule 1 g  1 g Oral BID Ivor Costa, MD   1 g at 12/26/17 1007  . ondansetron (ZOFRAN) injection 4 mg  4 mg Intravenous Q8H PRN Ivor Costa, MD      . pantoprazole (PROTONIX) EC tablet 80 mg  80 mg Oral Q1200 Ivor Costa, MD   80 mg at 12/26/17 1248  . polyvinyl alcohol (LIQUIFILM TEARS) 1.4 % ophthalmic solution 1 drop  1 drop Both Eyes PRN Ivor Costa, MD      . potassium chloride SA (K-DUR,KLOR-CON) CR tablet 40 mEq  40 mEq Oral Daily Domenic Polite, MD   40 mEq at 12/26/17 1015  . simvastatin (ZOCOR) tablet 20 mg  20 mg Oral QPM Ivor Costa, MD   20 mg at 12/25/17 1758  . tamsulosin (FLOMAX) capsule 0.4 mg  0.4 mg Oral QHS Ivor Costa, MD   0.4 mg at 12/25/17 2144  . traMADol (ULTRAM) tablet 50 mg  50 mg Oral Q8H PRN Ivor Costa, MD      . zolpidem (AMBIEN) tablet 5 mg  5 mg Oral QHS PRN Ivor Costa, MD   5 mg at 12/25/17 2223     Discharge Medications: Please see discharge summary for a list of discharge medications.  Relevant Imaging Results:  Relevant Lab Results:   Additional Information SS# [id number]  Vinie Sill, LCSWA

## 2017-12-27 DIAGNOSIS — R296 Repeated falls: Secondary | ICD-10-CM | POA: Diagnosis not present

## 2017-12-27 DIAGNOSIS — Z515 Encounter for palliative care: Secondary | ICD-10-CM

## 2017-12-27 DIAGNOSIS — R21 Rash and other nonspecific skin eruption: Secondary | ICD-10-CM | POA: Diagnosis not present

## 2017-12-27 DIAGNOSIS — G20 Parkinson's disease: Secondary | ICD-10-CM | POA: Diagnosis not present

## 2017-12-27 DIAGNOSIS — R7989 Other specified abnormal findings of blood chemistry: Secondary | ICD-10-CM | POA: Diagnosis not present

## 2017-12-27 DIAGNOSIS — Z7401 Bed confinement status: Secondary | ICD-10-CM | POA: Diagnosis not present

## 2017-12-27 DIAGNOSIS — R2681 Unsteadiness on feet: Secondary | ICD-10-CM | POA: Diagnosis not present

## 2017-12-27 DIAGNOSIS — R1310 Dysphagia, unspecified: Secondary | ICD-10-CM | POA: Diagnosis not present

## 2017-12-27 DIAGNOSIS — I959 Hypotension, unspecified: Secondary | ICD-10-CM | POA: Diagnosis not present

## 2017-12-27 DIAGNOSIS — I5032 Chronic diastolic (congestive) heart failure: Secondary | ICD-10-CM | POA: Diagnosis not present

## 2017-12-27 DIAGNOSIS — M6281 Muscle weakness (generalized): Secondary | ICD-10-CM | POA: Diagnosis not present

## 2017-12-27 DIAGNOSIS — I69991 Dysphagia following unspecified cerebrovascular disease: Secondary | ICD-10-CM | POA: Diagnosis not present

## 2017-12-27 DIAGNOSIS — G8190 Hemiplegia, unspecified affecting unspecified side: Secondary | ICD-10-CM | POA: Diagnosis not present

## 2017-12-27 DIAGNOSIS — I509 Heart failure, unspecified: Secondary | ICD-10-CM | POA: Diagnosis not present

## 2017-12-27 DIAGNOSIS — I69921 Dysphasia following unspecified cerebrovascular disease: Secondary | ICD-10-CM | POA: Diagnosis not present

## 2017-12-27 DIAGNOSIS — Z7189 Other specified counseling: Secondary | ICD-10-CM

## 2017-12-27 DIAGNOSIS — J690 Pneumonitis due to inhalation of food and vomit: Secondary | ICD-10-CM

## 2017-12-27 DIAGNOSIS — J189 Pneumonia, unspecified organism: Secondary | ICD-10-CM | POA: Diagnosis not present

## 2017-12-27 DIAGNOSIS — R488 Other symbolic dysfunctions: Secondary | ICD-10-CM | POA: Diagnosis not present

## 2017-12-27 DIAGNOSIS — R498 Other voice and resonance disorders: Secondary | ICD-10-CM | POA: Diagnosis not present

## 2017-12-27 DIAGNOSIS — R1312 Dysphagia, oropharyngeal phase: Secondary | ICD-10-CM | POA: Diagnosis not present

## 2017-12-27 DIAGNOSIS — E785 Hyperlipidemia, unspecified: Secondary | ICD-10-CM | POA: Diagnosis not present

## 2017-12-27 DIAGNOSIS — D649 Anemia, unspecified: Secondary | ICD-10-CM | POA: Diagnosis not present

## 2017-12-27 DIAGNOSIS — Z23 Encounter for immunization: Secondary | ICD-10-CM | POA: Diagnosis not present

## 2017-12-27 DIAGNOSIS — R2689 Other abnormalities of gait and mobility: Secondary | ICD-10-CM | POA: Diagnosis not present

## 2017-12-27 DIAGNOSIS — E876 Hypokalemia: Secondary | ICD-10-CM | POA: Diagnosis not present

## 2017-12-27 DIAGNOSIS — I480 Paroxysmal atrial fibrillation: Secondary | ICD-10-CM | POA: Diagnosis not present

## 2017-12-27 DIAGNOSIS — N189 Chronic kidney disease, unspecified: Secondary | ICD-10-CM | POA: Diagnosis not present

## 2017-12-27 DIAGNOSIS — M2550 Pain in unspecified joint: Secondary | ICD-10-CM | POA: Diagnosis not present

## 2017-12-27 LAB — CULTURE, BLOOD (ROUTINE X 2)
Culture: NO GROWTH
Special Requests: ADEQUATE

## 2017-12-27 NOTE — Plan of Care (Signed)
Discussed plan of care for the evening with patient.  Patient is very anxious to go home.  Often asked me to call daughters to inquire about when he can go home.  Patient enjoys drinking [employer], but must take small sips.  If not, he will begin to choke and cough.  Emphasized the importance of using suction when he begins to cough.  Minimal teach back displayed.

## 2017-12-27 NOTE — Discharge Summary (Signed)
Physician Discharge Summary  Jason [name] WER:[id number] DOB: 1927/09/16 DOA: 12/22/2017  PCP: [name], MD  Admit date: 12/22/2017 Discharge date: 12/27/2017  Time spent: 45 minutes  Recommendations for Outpatient Follow-up:  1. PCP in 1 week, continue goals of care discussions 2. Needs palliative care follow-up at SNF 3. SLP follow-up at SNF   Discharge Diagnoses:  Aspiration pneumonia Dysphagia   Hyperlipidemia   GERD (gastroesophageal reflux disease)   PAF (paroxysmal atrial fibrillation) (HCC)   Acute renal failure superimposed on stage 3 chronic kidney disease (HCC)   CVA (cerebral vascular accident) ([place])   Parkinsonism ([place])   Sepsis ([place])   Chronic diastolic CHF (congestive heart failure) ([place])   Aspiration pneumonia ([place])   Diarrhea   Discharge Condition: Stable  Diet recommendation: Heart healthy diet, whole meds with pure  [facility] Weights   12/22/17 2100 12/23/17 0655  Weight: 94.8 kg 93.8 kg    History of present illness:  NarrativeJimmy W Wilsonis a 84 y.o.malewith medical history significant ofstroke, Parkinson's disease, hyperlipidemia, GERD,dCHF, PAF on Eliquis, CKD-3, remote bladder cancer (excision only), non-Hodgkin lymphoma (s/p ofchemo and radiation therapy, not on chemo currently, following up withDr. Beryle Beams), presents with cough, shortness of breath, fever and chills, found to have aspiration pneumonia.  Hospital Course:   Aspiration pneumonia -long-standing history of dysphagia -Clinically improving, started on broad-spectrum antibiotics initially, subsequently transitioned to IV Unasyn  -blood cultures grew enterococcus, infectious disease was consulted, repeat blood cultures showed no growth, per ID recommendations transition to oral antibiotics to complete 7-day course -SLP following, status post bedside evaluation noted to have oropharyngeal dysphagia with signs and symptoms of overt aspiration, moderate risk, remains on  a regular diet with thin liquids, discussed with the daughter, long-standing history of dysphagia, aspiration risk will persist. -Given this condition we discussed CODE STATUS and DNR recommended, daughter agreeable -Physical therapy evaluation completed SNF for rehabilitation recommended and planned -Palliative care consulted recommend palliative care follow-up at SNF   Paroxysmal atrial fibrillation -In sinus rhythm at this time, -Continue metoprolol and anticoagulation with Eliquis   history of CVA -Continue eliquis and statin   History of Parkinson's disease -Continue Sinemet   Chronic diastolic CHF - Last echo with preserved ejection fraction, -Clinically appears euvolemic -resumed oral diuretics along with potassium   Mild acute kidney injury -due to above illness, sepsis and diuretics -Improved and stable  CODE STATUS changed to DO NOT RESUSCITATE  Discharge Exam: Vitals:   12/26/17 2325 12/27/17 0737  BP: (!) 142/81 133/85  Pulse: 83 91  Resp: 18 20  Temp: 97.6 F (36.4 C) 98.3 F (36.8 C)  SpO2: 99% 97%    General: AAO x self and place only Cardiovascular: S1S2/RRR Respiratory: ronchi at bases  Discharge Instructions   Discharge Instructions    Increase activity slowly   Complete by:  As directed      Allergies as of 12/27/2017   No Known Allergies     Medication List    STOP taking these medications   doxycycline 100 MG tablet Commonly known as:  ADOXA   senna 8.6 MG tablet Commonly known as:  SENOKOT     TAKE these medications   acetaminophen 325 MG tablet Commonly known as:  TYLENOL Take 2 tablets (650 mg total) by mouth every 6 (six) hours as needed for fever, headache or moderate pain.   amoxicillin-clavulanate 875-125 MG tablet Commonly known as:  AUGMENTIN Take 1 tablet by mouth every 12 (twelve) hours for 4 days.  benzonatate 100 MG capsule Commonly known as:  TESSALON Take 100 mg by mouth every 8 (eight) hours as  needed for cough.   carbidopa-levodopa 25-100 MG tablet Commonly known as:  SINEMET IR Take 1 tablet by mouth 3 (three) times daily. Take at 8am, 12noon, 5pm What changed:  Another medication with the same name was removed. Continue taking this medication, and follow the directions you see here.   COLACE PO Take 100 mg by mouth at bedtime.   ELIQUIS 5 MG Tabs tablet Generic drug:  apixaban Take 5 mg by mouth 2 (two) times daily.   esomeprazole 40 MG capsule Commonly known as:  NEXIUM TAKE 1 CAPSULE (40 MG TOTAL) BY MOUTH 2 (TWO) TIMES DAILY BEFORE A MEAL.   ferrous sulfate 325 (65 FE) MG tablet Take 325 mg by mouth daily with breakfast.   fish oil-omega-3 fatty acids 1000 MG capsule Take 1 g by mouth 2 (two) times daily.   fluticasone 50 MCG/ACT nasal spray Commonly known as:  FLONASE Place 2 sprays into both nostrils daily.   furosemide 40 MG tablet Commonly known as:  LASIX Take 40 mg by mouth 2 (two) times daily.   ICAPS LUTEIN-ZEAXANTHIN PO Take 1 tablet by mouth 2 (two) times daily.   ICAPS MV PO Take 1 capsule by mouth 2 (two) times daily.   loperamide 2 MG tablet Commonly known as:  IMODIUM A-D Take 4 mg by mouth 4 (four) times daily as needed for diarrhea or loose stools.   Melatonin 3 MG Tabs Take 3 mg by mouth at bedtime.   metoprolol tartrate 25 MG tablet Commonly known as:  LOPRESSOR Take 25 mg by mouth 2 (two) times daily.   potassium chloride SA 20 MEQ tablet Commonly known as:  K-DUR,KLOR-CON Take 2 tablets (40 mEq total) by mouth daily.   senna-docusate 8.6-50 MG tablet Commonly known as:  Senokot-S Take 1 tablet by mouth daily as needed for mild constipation.   simvastatin 20 MG tablet Commonly known as:  ZOCOR Take 20 mg by mouth every evening.   SYSTANE 0.4-0.3 % Soln Generic drug:  Polyethyl Glycol-Propyl Glycol Place 1 drop into both eyes daily as needed (for dry eyes).   tamsulosin 0.4 MG Caps capsule Commonly known as:   FLOMAX Take 0.4 mg by mouth at bedtime.   traMADol 50 MG tablet Commonly known as:  ULTRAM Take 1 tablet (50 mg total) by mouth every 8 (eight) hours as needed for moderate pain.      No Known Allergies Follow-up Information    [name], MD. Schedule an appointment as soon as possible for a visit in 1 week(s).   Specialty:  Family Medicine Contact information: [address] [place] [address] [phone]            The results of significant diagnostics from this hospitalization (including imaging, microbiology, ancillary and laboratory) are listed below for reference.    Significant Diagnostic Studies: Dg Chest 2 View  Result Date: 12/22/2017 CLINICAL DATA:  Abdominal pain and nausea.  Dyspnea. EXAM: CHEST - 2 VIEW COMPARISON:  12/07/2017 FINDINGS: The patient's chin obscures the left lung apex. Stable cardiomegaly with interstitial edema. Hazy opacity at the left lung base compatible with a layering left small to moderate effusion similar in appearance to prior. Superimposed pneumonia would be difficult to entirely exclude. Probable adjacent atelectasis at the left lung base. Degenerative changes are present about both shoulders and dorsal spine. IMPRESSION: 1. Cardiomegaly with mild CHF.  2. Small to moderate layering left effusion is believed to account for the hazy opacity at the left lung base. Superimposed pneumonia and/or atelectasis would be difficult to entirely exclude. Electronically Signed   By: Ashley Royalty M.D.   On: 12/22/2017 18:52   Dg Chest 2 View  Result Date: 12/08/2017 CLINICAL DATA:  Cough. EXAM: CHEST - 2 VIEW COMPARISON:  07/03/2017 FINDINGS: Cardiac silhouette is mildly enlarged. No mediastinal or masses. No convincing adenopathy. Left pleural effusion and associated left lung base opacity is similar to the prior exam. Lung base opacity is likely atelectasis, although pneumonia as a component is not excluded. There are reticular type  opacities in the right mid and lower lung consistent with scarring similar to the prior study. There is no evidence of pulmonary edema. No right pleural effusion. No pneumothorax. Skeletal structures are demineralized but grossly intact. IMPRESSION: 1. Left lung base opacity consistent with a combination pleural fluid and atelectasis or possibly pneumonia. This is similar to the most recent prior exam. 2. No other evidence of pneumonia.  No pulmonary edema. Electronically Signed   By: Lajean Manes M.D.   On: 12/08/2017 08:00    Microbiology: Recent Results (from the past 240 hour(s))  Blood culture (routine x 2)     Status: Abnormal   Collection Time: 12/22/17  4:53 PM  Result Value Ref Range Status   Specimen Description BLOOD RIGHT ANTECUBITAL  Final   Special Requests   Final    BOTTLES DRAWN AEROBIC AND ANAEROBIC Blood Culture adequate volume   Culture  Setup Time   Final    GRAM POSITIVE COCCI IN CHAINS IN BOTH AEROBIC AND ANAEROBIC BOTTLES CRITICAL RESULT CALLED TO, READ BACK BY AND VERIFIED WITH: RJiles Crocker PHARMD, AT 1116 12/23/17 BY D. VANHOOK Performed at [facility], [place] [address] [place], [place] [address]    Culture ENTEROCOCCUS FAECALIS (A)  Final   Report Status 12/25/2017 FINAL  Final   Organism ID, Bacteria ENTEROCOCCUS FAECALIS  Final      Susceptibility   Enterococcus faecalis - MIC*    AMPICILLIN <=2 SENSITIVE Sensitive     VANCOMYCIN 1 SENSITIVE Sensitive     GENTAMICIN SYNERGY SENSITIVE Sensitive     * ENTEROCOCCUS FAECALIS  Blood Culture ID Panel (Reflexed)     Status: Abnormal   Collection Time: 12/22/17  4:53 PM  Result Value Ref Range Status   Enterococcus species DETECTED (A) NOT DETECTED Final    Comment: CRITICAL RESULT CALLED TO, READ BACK BY AND VERIFIED WITH: R. RUMBARGER PHARMD, AT 1116 12/23/17 BY D. VANHOOK    Vancomycin resistance NOT DETECTED NOT DETECTED Final   Listeria monocytogenes NOT DETECTED NOT DETECTED Final   Staphylococcus  species NOT DETECTED NOT DETECTED Final   Staphylococcus aureus NOT DETECTED NOT DETECTED Final   Streptococcus species NOT DETECTED NOT DETECTED Final   Streptococcus agalactiae NOT DETECTED NOT DETECTED Final   Streptococcus pneumoniae NOT DETECTED NOT DETECTED Final   Streptococcus pyogenes NOT DETECTED NOT DETECTED Final   Acinetobacter baumannii NOT DETECTED NOT DETECTED Final   Enterobacteriaceae species NOT DETECTED NOT DETECTED Final   Enterobacter cloacae complex NOT DETECTED NOT DETECTED Final   Escherichia coli NOT DETECTED NOT DETECTED Final   Klebsiella oxytoca NOT DETECTED NOT DETECTED Final   Klebsiella pneumoniae NOT DETECTED NOT DETECTED Final   Proteus species NOT DETECTED NOT DETECTED Final   Serratia marcescens NOT DETECTED NOT DETECTED Final   Haemophilus influenzae NOT DETECTED NOT DETECTED  Final   Neisseria meningitidis NOT DETECTED NOT DETECTED Final   Pseudomonas aeruginosa NOT DETECTED NOT DETECTED Final   Candida albicans NOT DETECTED NOT DETECTED Final   Candida glabrata NOT DETECTED NOT DETECTED Final   Candida krusei NOT DETECTED NOT DETECTED Final   Candida parapsilosis NOT DETECTED NOT DETECTED Final   Candida tropicalis NOT DETECTED NOT DETECTED Final    Comment: Performed at [facility], [place] [address] [place], [place] [address]  Blood culture (routine x 2)     Status: None   Collection Time: 12/22/17  5:11 PM  Result Value Ref Range Status   Specimen Description BLOOD RIGHT HAND  Final   Special Requests   Final    BOTTLES DRAWN AEROBIC AND ANAEROBIC Blood Culture adequate volume   Culture   Final    NO GROWTH 5 DAYS Performed at [facility], [place] [address] [place], [place] [address]    Report Status 12/27/2017 FINAL  Final  Urine culture     Status: Abnormal   Collection Time: 12/22/17  8:40 PM  Result Value Ref Range Status   Specimen Description URINE, RANDOM  Final   Special Requests   Final    NONE Performed at [facility], [place] [address] [place], [place] [address]    Culture MULTIPLE SPECIES PRESENT, SUGGEST RECOLLECTION (A)  Final   Report Status 12/24/2017 FINAL  Final  Respiratory Panel by PCR     Status: None   Collection Time: 12/22/17  9:33 PM  Result Value Ref Range Status   Adenovirus NOT DETECTED NOT DETECTED Final   Coronavirus 229E NOT DETECTED NOT DETECTED Final   Coronavirus HKU1 NOT DETECTED NOT DETECTED Final   Coronavirus NL63 NOT DETECTED NOT DETECTED Final   Coronavirus OC43 NOT DETECTED NOT DETECTED Final   Metapneumovirus NOT DETECTED NOT DETECTED Final   Rhinovirus / Enterovirus NOT DETECTED NOT DETECTED Final   Influenza A NOT DETECTED NOT DETECTED Final   Influenza B NOT DETECTED NOT DETECTED Final   Parainfluenza Virus 1 NOT DETECTED NOT DETECTED Final   Parainfluenza Virus 2 NOT DETECTED NOT DETECTED Final   Parainfluenza Virus 3 NOT DETECTED NOT DETECTED Final   Parainfluenza Virus 4 NOT DETECTED NOT DETECTED Final   Respiratory Syncytial Virus NOT DETECTED NOT DETECTED Final   Bordetella pertussis NOT DETECTED NOT DETECTED Final   Chlamydophila pneumoniae NOT DETECTED NOT DETECTED Final   Mycoplasma pneumoniae NOT DETECTED NOT DETECTED Final    Comment: Performed at [facility], [place] [address] [place], [place] [address]  MRSA PCR Screening     Status: None   Collection Time: 12/22/17  9:33 PM  Result Value Ref Range Status   MRSA by PCR NEGATIVE NEGATIVE Final    Comment:        The GeneXpert MRSA Assay (FDA approved for NASAL specimens only), is one component of a comprehensive MRSA colonization surveillance program. It is not intended to diagnose MRSA infection nor to guide or monitor treatment for MRSA infections. Performed at [facility], [place] [address], [place], [place] [address]   Culture, blood (routine x 2)     Status: None (Preliminary result)   Collection Time: 12/24/17  5:31 AM  Result Value Ref Range Status   Specimen  Description BLOOD RIGHT HAND  Final   Special Requests   Final    BOTTLES DRAWN AEROBIC ONLY Blood Culture results may not be optimal due to  an inadequate volume of blood received in culture bottles   Culture   Final    NO GROWTH 3 DAYS Performed at [facility], [place] [address], [place], [place] [address]    Report Status PENDING  Incomplete  Culture, blood (routine x 2)     Status: None (Preliminary result)   Collection Time: 12/24/17  5:31 AM  Result Value Ref Range Status   Specimen Description BLOOD RIGHT HAND  Final   Special Requests   Final    BOTTLES DRAWN AEROBIC ONLY Blood Culture results may not be optimal due to an inadequate volume of blood received in culture bottles   Culture   Final    NO GROWTH 3 DAYS Performed at [facility], [place] [address] [place],  [address]    Report Status PENDING  Incomplete     Labs: Basic Metabolic Panel: Recent Labs  Lab 12/22/17 1653 12/24/17 0531 12/25/17 0232 12/26/17 0258  NA 141 140 140 141  K 4.2 3.4* 3.4* 3.4*  CL 101 105 107 107  CO2 26 26 22  20*  GLUCOSE 103* 91 84 98  BUN 19 17 15 13   CREATININE 1.59* 1.48* 1.43* 1.49*  CALCIUM 8.7* 8.3* 8.3* 8.6*   Liver Function Tests: Recent Labs  Lab 12/22/17 1653  AST 21  ALT 6  ALKPHOS 35*  BILITOT 0.8  PROT 8.4*  ALBUMIN 2.9*   No results for input(s): LIPASE, AMYLASE in the last 168 hours. No results for input(s): AMMONIA in the last 168 hours. CBC: Recent Labs  Lab 12/22/17 1653 12/24/17 0531 12/25/17 0232 12/26/17 0258  WBC 13.7* 10.8* 9.7 11.3*  HGB 13.3 12.6* 12.7* 13.3  HCT 43.9 39.9 40.1 41.9  MCV 88.2 86.4 86.1 85.3  PLT 192 155 159 184   Cardiac Enzymes: No results for input(s): CKTOTAL, CKMB, CKMBINDEX, TROPONINI in the last 168 hours. BNP: BNP (last 3 results) Recent Labs    12/22/17 2147  BNP 443.8*    ProBNP (last 3 results) No results for input(s): PROBNP in the last 8760 hours.  CBG: No results for input(s):  GLUCAP in the last 168 hours.     Signed:  Berle Mull MD.  [facility] 12/27/2017, 4:17 PM

## 2017-12-27 NOTE — Consult Note (Signed)
[facility] CM Primary Care Navigator  12/27/2017  Jason [name] [birth year] [id number]   Met with patient and daughter Jason [name]- from [place]) at the bedside to identify possible discharge needs. Daughter reportsthat patient had "trouble breathing, coughing and shakiness/ chills" that led to thisadmission. (aspiration pneumonia, dysphagia)  Daughtermentioned that patient is a resident of [facility]) assisted living facility for over a year.  Patient endorsesDr. Tamsen Meek with [place] at [facility] as the primary [place].   Patientuses facility pharmacy to obtain medications per daughter. Staff dispense and administer his medications at the facility.  Patient's daughters Jason [name] and Jason [name]) have been providingtransportation tohisdoctors'appointments.  Daughter states that facility staff provide assistance and care for patient.   Anticipated discharge planisSNF- skilled nursing facility per therapy recommendation and will need palliative care to follow-up at skilled nursing facility.   Patientand daughter expressedunderstanding to call primary care provider's office if he returns back to [name], for a post discharge follow-up appointment within1- 2 weeksor sooner if needs arise.Patient letter (with PCP's contact number) was provided as a reminder.  Explained to patient and daughter about [facility] CM services available for health management and resourcesbut indicated having no current needs or concerns for now.She mentioned that patient will be followed-up by palliative care and speech therapist at the skilled facility.  Patient and daughtervoiced understanding to seekreferral to [facility] care management from primary care provider if deemed necessary and appropriate for any servicesin the near future- if he gets back home ([name]).   [facility] care management information provided for future needs that  patient may have.   For additional questions please contact:  Edwena Felty A. Ceaira Ernster, BSN, RN-BC [facility] PRIMARY CARE Navigator Cell: [phone]

## 2017-12-27 NOTE — Progress Notes (Addendum)
Patient will Discharge To: [place] Date:12/27/17 Family Notified:yes, [facility] Transport By: Corey Harold   Per MD patient ready for DC to [facility] . RN, patient, patient's family, and facility notified of DC. Assessment, Fl2/Pasrr, and Discharge Summary sent to facility. RN given number for report [phone], Room # 701-P. DC packet on chart. Ambulance transport requested for patient.   CSW signing off.  Reed Breech LCSWA [phone]

## 2017-12-27 NOTE — Consult Note (Signed)
Consultation Note Date: 12/27/2017   Patient Name: Jason [name]  DOB: 05/25/1927  MRN: [id number]  Age / Sex: 84 y.o., male  PCP: [name], MD Referring Physician: Lavina Hamman, MD  Reason for Consultation: Establishing goals of care  HPI/Patient Profile: 84 y.o. male  with past medical history of CVA, Parkinson's disease, HLD, GERD,dCHF, PAF on Eliquis, CKD3, bladder cancer, and non-Hodgkin lymphoma (s/p ofchemo and radiation) admitted on 12/22/2017 with cough and shortness of breath. Diagnosed with aspiration pneumonia. Patient has a long history of dysphagia - signs and symptoms of overt aspiration during SLP evaluation. Patient had been refusing dysphagia diet, but after discussion with MD this AM he agreed. Patient was living at independent living facility, but appears family is now interested in SNF placement. PMT consulted by MD per family request.  Clinical Assessment and Goals of Care: I have reviewed medical records including EPIC notes, labs and imaging, received report from Dr. Posey Pronto, assessed the patient and then met with patient's daughter's, [name] and [name], to discuss diagnosis prognosis, [place], EOL wishes, disposition and options.  I introduced Palliative Medicine as specialized medical care for people living with serious illness. It focuses on providing relief from the symptoms and stress of a serious illness. The goal is to improve quality of life for both the patient and the family.  As far as functional and nutritional status, patient uses a walker for ambulation and requires assistance for ADLs. Daughters describe decreased appetite d/t dysphagia.   We discussed his current illness and what it means in the larger context of his on-going co-morbidities.  Natural disease trajectory and expectations at EOL were discussed. We discussed concerns about dysphagia and continued aspiration. Family has good understanding.  I  attempted to elicit values and goals of care important to the patient.  They discussed patient's refusal to eat dysphagia diet but now agreeing to eat it. They want to honor whatever he wishes.  The difference between aggressive medical intervention and comfort care was considered in light of the patient's goals of care. We discussed rehab vs hospice to follow at assisted living. Family asks extensive questions about each option. After thorough discussion, family feels as though rehab should be attempted first (with outpatient palliative to follow) and then decide from there what to do. We discussed what to look for to indicate improvement or what may indicate that hospice may be more appropriate.  Advance directives, concepts specific to code status, artifical feeding and hydration, and rehospitalization were considered and discussed. Family agrees to DNR. Patient would not want life artificially prolonged.  Hospice and Palliative Care services outpatient were explained and offered. Family agreeable to palliative care follow-up at SNF.  Questions and concerns were addressed.  Hard Choices booklet left for review. The family was encouraged to call with questions or concerns.   Primary Decision Maker NEXT OF KIN - daughters    SUMMARY OF RECOMMENDATIONS   Palliative follow up at SNF DNR Initial palliative education completed and education about disease process completed Discussed when hospice may be appropriate  Code Status/Advance Care Planning:  DNR   Symptom Management:   Per primary  Palliative Prophylaxis:   Aspiration, Delirium Protocol, Frequent Pain Assessment and Oral Care  Psycho-social/Spiritual:   Desire for further Chaplaincy support:no  Additional Recommendations: Education on Hospice  Prognosis:   Unable to determine   Discharge Planning: [place] for rehab with Palliative care service follow-up      Primary Diagnoses: Present on  Admission: . HCAP (healthcare-associated pneumonia) . CVA (cerebral vascular accident) ([place]) . GERD (gastroesophageal reflux disease) . Hyperlipidemia . PAF (paroxysmal atrial fibrillation) ([place]) . Parkinsonism ([place]) . Acute renal failure superimposed on stage 3 chronic kidney disease ([place]) . Sepsis ([place]) . Chronic diastolic CHF (congestive heart failure) ([place]) . Aspiration pneumonia ([place]) . Diarrhea   I have reviewed the medical record, interviewed the patient and family, and examined the patient. The following aspects are pertinent.  Past Medical History:  Diagnosis Date  . Arthritis   . Barrett esophagus   . Bladder cancer ([place]) dx'd [phone],[phone]   surg only  . CHF (congestive heart failure) ([place])   . Colon polyps    adenomatous  . Difficulty sleeping    OCCASIONALLY  . Diverticulosis of colon (without mention of hemorrhage)   . Frequency of urination   . GERD (gastroesophageal reflux disease)   . Hiatal hernia   . History of bladder cancer 1981   EXCISION ONLY  . History of skin cancer    BASAL CELL  . Hyperlipidemia   . Lymphoma, non-Hodgkin's ([place]) 2011   [place]   . Macular degeneration    LEFT EYE  . Multiple nodules of lung 09/16/2011  . PAF (paroxysmal atrial fibrillation) ([place])   . Personal history of colonic polyps 07/15/2010   tubular adenoma  . Prostate cancer [facility])    patient denies  . PUD (peptic ulcer disease)   . Stomach cancer ([place])    NHL origin  . Stroke ([place])   . Weakness    Social History   Socioeconomic History  . Marital status: Widowed    Spouse name: Not on file  . Number of children: 2  . Years of education: HS  . Highest education level: Not on file  Occupational History  . Occupation: retired    Fish farm manager: RETIRED  Social Needs  . Financial resource strain: Not on file  . Food insecurity:    Worry: Not on file    Inability: Not on file  . Transportation needs:    Medical: Not on file    Non-medical: Not on file    Tobacco Use  . Smoking status: Former Smoker    Last attempt to quit: 10/29/1966    Years since quitting: 51.1  . Smokeless tobacco: Former Systems developer    Quit date: 06/29/1998  Substance and Sexual Activity  . Alcohol use: Yes    Comment: Socially  . Drug use: No  . Sexual activity: Not Currently    Birth control/protection: None  Lifestyle  . Physical activity:    Days per week: Not on file    Minutes per session: Not on file  . Stress: Not on file  Relationships  . Social connections:    Talks on phone: Not on file    Gets together: Not on file    Attends religious service: Not on file    Active member of club or organization: Not on file    Attends meetings of clubs or organizations: Not on file    Relationship status: Not on file  Other Topics Concern  . Not on file  Social History Narrative   Lives at home alone.   Right-handed.   Occasional use of caffeine.   Family History  Problem Relation Age of Onset  . Heart attack Father   . Other Mother        "old age"   Scheduled Meds: . amoxicillin-clavulanate  1 tablet Oral Q12H  .  apixaban  5 mg Oral BID  . carbidopa-levodopa  1 tablet Oral TID  . ferrous sulfate  325 mg Oral Q breakfast  . fluticasone  2 spray Each Nare Daily  . furosemide  40 mg Oral Daily  . Melatonin  3 mg Oral QHS  . metoprolol tartrate  25 mg Oral BID  . multivitamin  1 tablet Oral Daily  . omega-3 acid ethyl esters  1 g Oral BID  . pantoprazole  80 mg Oral Q1200  . potassium chloride  40 mEq Oral Daily  . simvastatin  20 mg Oral QPM  . tamsulosin  0.4 mg Oral QHS   Continuous Infusions: PRN Meds:.acetaminophen, albuterol, dextromethorphan-guaiFENesin, ondansetron (ZOFRAN) IV, polyvinyl alcohol, traMADol, zolpidem No Known Allergies Review of Systems  Constitutional: Positive for activity change and appetite change.  HENT: Positive for trouble swallowing.   Respiratory: Negative for shortness of breath.   Cardiovascular: Negative for  chest pain.  Neurological: Positive for tremors and weakness.    Physical Exam  Constitutional: He is oriented to person, place, and time. He appears well-developed and well-nourished. He does not appear ill. No distress.  HENT:  Head: Normocephalic and atraumatic.  Cardiovascular: Normal rate and regular rhythm.  Pulmonary/Chest: Effort normal and breath sounds normal. No respiratory distress.  Abdominal: Soft. Normal appearance.  Neurological: He is alert and oriented to person, place, and time.  Skin: Skin is warm and dry.    Vital Signs: BP 133/85 (BP Location: Left Arm)   Pulse 91   Temp 98.3 F (36.8 C) (Oral)   Resp 20   Ht _0  (1.778 m)   Wt 93.8 kg   SpO2 97%   BMI 29.67 kg/m  Pain Scale: 0-10   Pain Score: 0-No pain   SpO2: SpO2: 97 % O2 Device:SpO2: 97 % O2 Flow Rate: .   IO: Intake/output summary:   Intake/Output Summary (Last 24 hours) at 12/27/2017 1616 Last data filed at 12/26/2017 2046 Gross per 24 hour  Intake -  Output 400 ml  Net -400 ml    LBM: Last BM Date: 12/27/17 Baseline Weight: Weight: 94.8 kg Most recent weight: Weight: 93.8 kg     Palliative Assessment/Data: PPS 40%     Time Total: 70 minutes Greater than 50%  of this time was spent counseling and coordinating care related to the above assessment and plan.  Juel Burrow, DNP, AGNP-C Palliative Medicine Team [phone] Pager: [phone]

## 2017-12-28 DIAGNOSIS — G20 Parkinson's disease: Secondary | ICD-10-CM | POA: Diagnosis not present

## 2017-12-28 DIAGNOSIS — Z7189 Other specified counseling: Secondary | ICD-10-CM

## 2017-12-28 DIAGNOSIS — J690 Pneumonitis due to inhalation of food and vomit: Secondary | ICD-10-CM | POA: Diagnosis not present

## 2017-12-28 DIAGNOSIS — E785 Hyperlipidemia, unspecified: Secondary | ICD-10-CM | POA: Diagnosis not present

## 2017-12-28 DIAGNOSIS — Z515 Encounter for palliative care: Secondary | ICD-10-CM

## 2017-12-28 DIAGNOSIS — I509 Heart failure, unspecified: Secondary | ICD-10-CM | POA: Diagnosis not present

## 2017-12-29 DIAGNOSIS — J690 Pneumonitis due to inhalation of food and vomit: Secondary | ICD-10-CM | POA: Diagnosis not present

## 2017-12-29 DIAGNOSIS — D649 Anemia, unspecified: Secondary | ICD-10-CM | POA: Diagnosis not present

## 2017-12-29 DIAGNOSIS — E876 Hypokalemia: Secondary | ICD-10-CM | POA: Diagnosis not present

## 2017-12-29 DIAGNOSIS — I509 Heart failure, unspecified: Secondary | ICD-10-CM | POA: Diagnosis not present

## 2017-12-29 LAB — CULTURE, BLOOD (ROUTINE X 2)
CULTURE: NO GROWTH
CULTURE: NO GROWTH

## 2018-01-02 DIAGNOSIS — G20 Parkinson's disease: Secondary | ICD-10-CM | POA: Diagnosis not present

## 2018-01-02 DIAGNOSIS — N189 Chronic kidney disease, unspecified: Secondary | ICD-10-CM | POA: Diagnosis not present

## 2018-01-02 DIAGNOSIS — R7989 Other specified abnormal findings of blood chemistry: Secondary | ICD-10-CM | POA: Diagnosis not present

## 2018-01-02 DIAGNOSIS — I509 Heart failure, unspecified: Secondary | ICD-10-CM | POA: Diagnosis not present

## 2018-01-16 DIAGNOSIS — N189 Chronic kidney disease, unspecified: Secondary | ICD-10-CM | POA: Diagnosis not present

## 2018-01-18 DIAGNOSIS — G20 Parkinson's disease: Secondary | ICD-10-CM | POA: Diagnosis not present

## 2018-01-18 DIAGNOSIS — I959 Hypotension, unspecified: Secondary | ICD-10-CM | POA: Diagnosis not present

## 2018-01-18 DIAGNOSIS — I480 Paroxysmal atrial fibrillation: Secondary | ICD-10-CM | POA: Diagnosis not present

## 2018-01-18 DIAGNOSIS — I509 Heart failure, unspecified: Secondary | ICD-10-CM | POA: Diagnosis not present

## 2018-01-22 DIAGNOSIS — J690 Pneumonitis due to inhalation of food and vomit: Secondary | ICD-10-CM | POA: Diagnosis not present

## 2018-01-22 DIAGNOSIS — I509 Heart failure, unspecified: Secondary | ICD-10-CM | POA: Diagnosis not present

## 2018-01-24 DIAGNOSIS — J690 Pneumonitis due to inhalation of food and vomit: Secondary | ICD-10-CM | POA: Diagnosis not present

## 2018-01-24 DIAGNOSIS — I509 Heart failure, unspecified: Secondary | ICD-10-CM | POA: Diagnosis not present

## 2018-02-02 ENCOUNTER — Telehealth: Payer: Self-pay | Admitting: Cardiology

## 2018-02-05 NOTE — Telephone Encounter (Signed)
sign

## 2018-02-05 NOTE — Telephone Encounter (Signed)
done

## 2018-02-19 ENCOUNTER — Other Ambulatory Visit (HOSPITAL_COMMUNITY): Payer: Self-pay | Admitting: Internal Medicine

## 2018-02-19 DIAGNOSIS — R1310 Dysphagia, unspecified: Secondary | ICD-10-CM

## 2018-02-20 DIAGNOSIS — R21 Rash and other nonspecific skin eruption: Secondary | ICD-10-CM | POA: Diagnosis not present

## 2018-02-23 DIAGNOSIS — J690 Pneumonitis due to inhalation of food and vomit: Secondary | ICD-10-CM | POA: Diagnosis not present

## 2018-02-23 DIAGNOSIS — I509 Heart failure, unspecified: Secondary | ICD-10-CM | POA: Diagnosis not present

## 2018-02-23 DIAGNOSIS — I480 Paroxysmal atrial fibrillation: Secondary | ICD-10-CM | POA: Diagnosis not present

## 2018-02-23 DIAGNOSIS — N189 Chronic kidney disease, unspecified: Secondary | ICD-10-CM | POA: Diagnosis not present

## 2018-02-26 ENCOUNTER — Ambulatory Visit (HOSPITAL_COMMUNITY)
Admission: RE | Admit: 2018-02-26 | Discharge: 2018-02-26 | Disposition: A | Discharge status: Home / Self Care | Payer: No Typology Code available for payment source | Source: Ambulatory Visit | Attending: Internal Medicine | Admitting: Internal Medicine

## 2018-02-26 DIAGNOSIS — R21 Rash and other nonspecific skin eruption: Secondary | ICD-10-CM | POA: Diagnosis not present

## 2018-02-26 DIAGNOSIS — R1312 Dysphagia, oropharyngeal phase: Secondary | ICD-10-CM | POA: Insufficient documentation

## 2018-02-26 DIAGNOSIS — G20 Parkinson's disease: Secondary | ICD-10-CM | POA: Diagnosis not present

## 2018-02-26 DIAGNOSIS — R1310 Dysphagia, unspecified: Secondary | ICD-10-CM

## 2018-02-26 NOTE — Progress Notes (Signed)
Objective Swallowing Evaluation: Type of Study: MBS-Modified Barium Swallow Study   Patient Details  Name: Jason [name] MRN: [id number] Date of Birth: 1927/09/18  Today's Date: 02/26/2018 Time: SLP Start Time (ACUTE ONLY): 0737 -SLP Stop Time (ACUTE ONLY): 1062  SLP Time Calculation (min) (ACUTE ONLY): 25 min   Past Medical History:  Past Medical History:  Diagnosis Date  . Arthritis   . Barrett esophagus   . Bladder cancer ([place]) dx'd 6948,5462   surg only  . CHF (congestive heart failure) ([place])   . Colon polyps    adenomatous  . Difficulty sleeping    OCCASIONALLY  . Diverticulosis of colon (without mention of hemorrhage)   . Frequency of urination   . GERD (gastroesophageal reflux disease)   . Hiatal hernia   . History of bladder cancer 1981   EXCISION ONLY  . History of skin cancer    BASAL CELL  . Hyperlipidemia   . Lymphoma, non-Hodgkin's ([place]) 2011   [place]   . Macular degeneration    LEFT EYE  . Multiple nodules of lung 09/16/2011  . PAF (paroxysmal atrial fibrillation) ([place])   . Personal history of colonic polyps 07/15/2010   tubular adenoma  . Prostate cancer [facility])    patient denies  . PUD (peptic ulcer disease)   . Stomach cancer ([place])    NHL origin  . Stroke ([place])   . Weakness    Past Surgical History:  Past Surgical History:  Procedure Laterality Date  . APPENDECTOMY    . bone spur     left shoulder  . CHOLECYSTECTOMY    . KNEE ARTHROPLASTY Left 07/30/2015   Procedure: LEFT TOTAL KNEE ARTHROPLASTY WITH COMPUTER NAVIGATION;  Surgeon: Rod Can, MD;  Location: [facility] ORS;  Service: Orthopedics;  Laterality: Left;  . LASER OF PROSTATE W/ GREEN LIGHT PVP    . TENDON REPAIR     right arm  . TRIGGER FINGER RELEASE     bilateral hands  . WRIST SURGERY     Bilateral   HPI: Pt is an 84 year old male arriving for an OP MBS. He is currently a resident of [facility], typically resides at [facility]. He has a long history of dysphagia and is  currently on regular/soft solids and nectar thick liquids. He has a history of Parkinsons disease, falls, [place]ine CVA, pneumonia, GERD. Recently admitted in 12/2017 for pna. Prior MBS (multiple) have gnerally showed delayed swallow response, vallecular residue due to base of tongue weakness and silent penetration/aspiration both before during and after the swallow. He has undergone RMST, strengthening exercises in the past.    No data recorded   Assessment / Plan / Recommendation  CHL IP CLINICAL IMPRESSIONS 02/26/2018  Clinical Impression Pts swallow is functionally unchanged, though severity of deficits has improved. Still a mild delay in initiation, decreased laryngeal elevation, epiglottic deflection and base of tongue retraction with moderate residue. Pt given one verbal cue at beginning of exam to take small sips, he carried this out independently throughout the test with no further cueing, which aided in reducing risk with timing deficit; most of thin and nectar thick boluses stayed in the valleculae prior to swallow initiation. There was intermittent trace silent penetration with nectar, slightly increased in quantity with thin. Attempted a chin tuck to reduce vallecular residue without effect. Multiple swallows and liquid wash more effective, though during these efforts minimal thin/puree penetrate collected in subglottis (silent aspiration) and only cleared with verbal cue for cough  and second swallow.   Overall, pts chronic dysphagia persists. Risk of aspiration pna will fluctuate based on respiratory health, generalized strength, use of compensatory strategies, and oral hygiene. Given pt and family reported acceptance of risk and desire to return to regular solids and thin liquids, would suggest targeting respiratory hygiene and training preventative cough/throat clear with home exercise program to maintain gains in pharyngeal strength though decisions regarding diet and treatment plan are  ultimately up to the primary care team and therapist.   SLP Visit Diagnosis Dysphagia, oropharyngeal phase (R13.12)  Attention and concentration deficit following --  Frontal lobe and executive function deficit following --  Impact on safety and function Moderate aspiration risk      CHL IP TREATMENT RECOMMENDATION 02/26/2018  Treatment Recommendations Defer treatment plan to f/u with SLP     Prognosis 12/23/2017  Prognosis for Safe Diet Advancement Good  Barriers to Reach Goals Cognitive deficits  Barriers/Prognosis Comment --    CHL IP DIET RECOMMENDATION 02/26/2018  SLP Diet Recommendations Regular solids;Thin liquid  Liquid Administration via Cup;No straw  Medication Administration Whole meds with liquid  Compensations Slow rate;Small sips/bites;Follow solids with liquid;Clear throat intermittently  Postural Changes Seated upright at 90 degrees      CHL IP OTHER RECOMMENDATIONS 02/26/2018  Recommended Consults --  Oral Care Recommendations Oral care before and after PO  Other Recommendations --      CHL IP FOLLOW UP RECOMMENDATIONS 12/25/2017  Follow up Recommendations (No Data)      CHL IP FREQUENCY AND DURATION 12/23/2017  Speech Therapy Frequency (ACUTE ONLY) min 2x/week  Treatment Duration 1 week           CHL IP ORAL PHASE 02/26/2018  Oral Phase WFL  Oral - Pudding Teaspoon --  Oral - Pudding Cup --  Oral - Honey Teaspoon --  Oral - Honey Cup --  Oral - Nectar Teaspoon --  Oral - Nectar Cup --  Oral - Nectar Straw --  Oral - Thin Teaspoon --  Oral - Thin Cup --  Oral - Thin Straw --  Oral - Puree --  Oral - Mech Soft --  Oral - Regular --  Oral - Multi-Consistency --  Oral - Pill --  Oral Phase - Comment --    CHL IP PHARYNGEAL PHASE 02/26/2018  Pharyngeal Phase Impaired  Pharyngeal- Pudding Teaspoon --  Pharyngeal --  Pharyngeal- Pudding Cup --  Pharyngeal --  Pharyngeal- Honey Teaspoon --  Pharyngeal --  Pharyngeal- Honey Cup --  Pharyngeal  --  Pharyngeal- Nectar Teaspoon --  Pharyngeal --  Pharyngeal- Nectar Cup Delayed swallow initiation-vallecula;Reduced epiglottic inversion;Reduced tongue base retraction;Reduced laryngeal elevation;Penetration/Aspiration during swallow;Pharyngeal residue - valleculae  Pharyngeal Material enters airway, remains ABOVE vocal cords and not ejected out;Material does not enter airway  Pharyngeal- Nectar Straw --  Pharyngeal --  Pharyngeal- Thin Teaspoon --  Pharyngeal --  Pharyngeal- Thin Cup Delayed swallow initiation-vallecula;Reduced epiglottic inversion;Reduced tongue base retraction;Reduced laryngeal elevation;Penetration/Aspiration during swallow;Pharyngeal residue - valleculae;Penetration/Apiration after swallow;Trace aspiration  Pharyngeal Material enters airway, passes BELOW cords without attempt by patient to eject out (silent aspiration);Material enters airway, CONTACTS cords and not ejected out;Material enters airway, remains ABOVE vocal cords and not ejected out;Material does not enter airway  Pharyngeal- Thin Straw --  Pharyngeal --  Pharyngeal- Puree Penetration/Apiration after swallow;Trace aspiration;Delayed swallow initiation-vallecula;Reduced anterior laryngeal mobility;Reduced epiglottic inversion;Reduced laryngeal elevation;Reduced tongue base retraction;Pharyngeal residue - valleculae  Pharyngeal Material enters airway, passes BELOW cords without attempt by patient to eject out (  silent aspiration);Material does not enter airway  Pharyngeal- Mechanical Soft --  Pharyngeal --  Pharyngeal- Regular Delayed swallow initiation-vallecula;Reduced epiglottic inversion;Reduced anterior laryngeal mobility;Reduced laryngeal elevation;Pharyngeal residue - valleculae  Pharyngeal --  Pharyngeal- Multi-consistency --  Pharyngeal --  Pharyngeal- Pill --  Pharyngeal --  Pharyngeal Comment --     CHL IP CERVICAL ESOPHAGEAL PHASE 10/20/2016  Cervical Esophageal Phase WFL  Pudding Teaspoon --   Pudding Cup --  Honey Teaspoon --  Honey Cup --  Nectar Teaspoon --  Nectar Cup --  Nectar Straw --  Thin Teaspoon --  Thin Cup --  Thin Straw --  Puree --  Mechanical Soft --  Regular --  Multi-consistency --  Pill --  Cervical Esophageal Comment --    [name], MA CCC-SLP  Acute Rehabilitation Services Pager [phone] Office [phone]  Lynann Beaver 02/26/2018, 1:46 PM

## 2018-03-05 DIAGNOSIS — R000 Tachycardia, unspecified: Secondary | ICD-10-CM | POA: Diagnosis not present

## 2018-03-05 DIAGNOSIS — R0602 Shortness of breath: Secondary | ICD-10-CM | POA: Diagnosis not present

## 2018-03-07 DIAGNOSIS — Z8673 Personal history of transient ischemic attack (TIA), and cerebral infarction without residual deficits: Secondary | ICD-10-CM | POA: Diagnosis not present

## 2018-03-07 DIAGNOSIS — R531 Weakness: Secondary | ICD-10-CM | POA: Diagnosis not present

## 2018-03-07 DIAGNOSIS — G20 Parkinson's disease: Secondary | ICD-10-CM | POA: Diagnosis not present

## 2018-03-07 DIAGNOSIS — I480 Paroxysmal atrial fibrillation: Secondary | ICD-10-CM | POA: Diagnosis not present

## 2018-03-07 DIAGNOSIS — Z79899 Other long term (current) drug therapy: Secondary | ICD-10-CM | POA: Diagnosis not present

## 2018-03-08 DIAGNOSIS — Z79899 Other long term (current) drug therapy: Secondary | ICD-10-CM | POA: Diagnosis not present

## 2018-03-08 DIAGNOSIS — G20 Parkinson's disease: Secondary | ICD-10-CM | POA: Diagnosis not present

## 2018-03-08 DIAGNOSIS — I480 Paroxysmal atrial fibrillation: Secondary | ICD-10-CM | POA: Diagnosis not present

## 2018-03-08 DIAGNOSIS — R531 Weakness: Secondary | ICD-10-CM | POA: Diagnosis not present

## 2018-03-08 DIAGNOSIS — Z8673 Personal history of transient ischemic attack (TIA), and cerebral infarction without residual deficits: Secondary | ICD-10-CM | POA: Diagnosis not present

## 2018-03-12 DIAGNOSIS — Z8546 Personal history of malignant neoplasm of prostate: Secondary | ICD-10-CM | POA: Diagnosis not present

## 2018-03-12 DIAGNOSIS — K219 Gastro-esophageal reflux disease without esophagitis: Secondary | ICD-10-CM | POA: Diagnosis not present

## 2018-03-12 DIAGNOSIS — J189 Pneumonia, unspecified organism: Secondary | ICD-10-CM | POA: Diagnosis not present

## 2018-03-12 DIAGNOSIS — N183 Chronic kidney disease, stage 3 (moderate): Secondary | ICD-10-CM | POA: Diagnosis not present

## 2018-03-12 DIAGNOSIS — R1312 Dysphagia, oropharyngeal phase: Secondary | ICD-10-CM | POA: Diagnosis not present

## 2018-03-12 DIAGNOSIS — Z8572 Personal history of non-Hodgkin lymphomas: Secondary | ICD-10-CM | POA: Diagnosis not present

## 2018-03-12 DIAGNOSIS — Z87891 Personal history of nicotine dependence: Secondary | ICD-10-CM | POA: Diagnosis not present

## 2018-03-12 DIAGNOSIS — R1310 Dysphagia, unspecified: Secondary | ICD-10-CM | POA: Diagnosis not present

## 2018-03-12 DIAGNOSIS — I480 Paroxysmal atrial fibrillation: Secondary | ICD-10-CM | POA: Diagnosis not present

## 2018-03-12 DIAGNOSIS — Z9181 History of falling: Secondary | ICD-10-CM | POA: Diagnosis not present

## 2018-03-12 DIAGNOSIS — Z8551 Personal history of malignant neoplasm of bladder: Secondary | ICD-10-CM | POA: Diagnosis not present

## 2018-03-12 DIAGNOSIS — Z7901 Long term (current) use of anticoagulants: Secondary | ICD-10-CM | POA: Diagnosis not present

## 2018-03-12 DIAGNOSIS — I5032 Chronic diastolic (congestive) heart failure: Secondary | ICD-10-CM | POA: Diagnosis not present

## 2018-03-12 DIAGNOSIS — G20 Parkinson's disease: Secondary | ICD-10-CM | POA: Diagnosis not present

## 2018-03-12 DIAGNOSIS — Z85828 Personal history of other malignant neoplasm of skin: Secondary | ICD-10-CM | POA: Diagnosis not present

## 2018-03-12 DIAGNOSIS — I69391 Dysphagia following cerebral infarction: Secondary | ICD-10-CM | POA: Diagnosis not present

## 2018-03-12 DIAGNOSIS — Z85028 Personal history of other malignant neoplasm of stomach: Secondary | ICD-10-CM | POA: Diagnosis not present

## 2018-03-12 DIAGNOSIS — E785 Hyperlipidemia, unspecified: Secondary | ICD-10-CM | POA: Diagnosis not present

## 2018-03-12 DIAGNOSIS — K2270 Barrett's esophagus without dysplasia: Secondary | ICD-10-CM | POA: Diagnosis not present

## 2018-03-13 DIAGNOSIS — N183 Chronic kidney disease, stage 3 (moderate): Secondary | ICD-10-CM | POA: Diagnosis not present

## 2018-03-13 DIAGNOSIS — G20 Parkinson's disease: Secondary | ICD-10-CM | POA: Diagnosis not present

## 2018-03-13 DIAGNOSIS — R1310 Dysphagia, unspecified: Secondary | ICD-10-CM | POA: Diagnosis not present

## 2018-03-13 DIAGNOSIS — I69391 Dysphagia following cerebral infarction: Secondary | ICD-10-CM | POA: Diagnosis not present

## 2018-03-13 DIAGNOSIS — I5032 Chronic diastolic (congestive) heart failure: Secondary | ICD-10-CM | POA: Diagnosis not present

## 2018-03-13 DIAGNOSIS — I480 Paroxysmal atrial fibrillation: Secondary | ICD-10-CM | POA: Diagnosis not present

## 2018-03-15 DIAGNOSIS — I69391 Dysphagia following cerebral infarction: Secondary | ICD-10-CM | POA: Diagnosis not present

## 2018-03-15 DIAGNOSIS — I5032 Chronic diastolic (congestive) heart failure: Secondary | ICD-10-CM | POA: Diagnosis not present

## 2018-03-15 DIAGNOSIS — N183 Chronic kidney disease, stage 3 (moderate): Secondary | ICD-10-CM | POA: Diagnosis not present

## 2018-03-15 DIAGNOSIS — I480 Paroxysmal atrial fibrillation: Secondary | ICD-10-CM | POA: Diagnosis not present

## 2018-03-15 DIAGNOSIS — G20 Parkinson's disease: Secondary | ICD-10-CM | POA: Diagnosis not present

## 2018-03-15 DIAGNOSIS — R1310 Dysphagia, unspecified: Secondary | ICD-10-CM | POA: Diagnosis not present

## 2018-03-20 ENCOUNTER — Telehealth: Payer: Self-pay | Admitting: Cardiology

## 2018-03-20 DIAGNOSIS — G20 Parkinson's disease: Secondary | ICD-10-CM | POA: Diagnosis not present

## 2018-03-20 DIAGNOSIS — I69391 Dysphagia following cerebral infarction: Secondary | ICD-10-CM | POA: Diagnosis not present

## 2018-03-20 DIAGNOSIS — I480 Paroxysmal atrial fibrillation: Secondary | ICD-10-CM | POA: Diagnosis not present

## 2018-03-20 DIAGNOSIS — N183 Chronic kidney disease, stage 3 (moderate): Secondary | ICD-10-CM | POA: Diagnosis not present

## 2018-03-20 DIAGNOSIS — I5032 Chronic diastolic (congestive) heart failure: Secondary | ICD-10-CM | POA: Diagnosis not present

## 2018-03-20 DIAGNOSIS — R1310 Dysphagia, unspecified: Secondary | ICD-10-CM | POA: Diagnosis not present

## 2018-03-20 NOTE — Telephone Encounter (Signed)
LAM for patient's daughter to return call to schedule appt

## 2018-03-21 DIAGNOSIS — I480 Paroxysmal atrial fibrillation: Secondary | ICD-10-CM | POA: Diagnosis not present

## 2018-03-21 DIAGNOSIS — I69391 Dysphagia following cerebral infarction: Secondary | ICD-10-CM | POA: Diagnosis not present

## 2018-03-21 DIAGNOSIS — I5032 Chronic diastolic (congestive) heart failure: Secondary | ICD-10-CM | POA: Diagnosis not present

## 2018-03-21 DIAGNOSIS — N183 Chronic kidney disease, stage 3 (moderate): Secondary | ICD-10-CM | POA: Diagnosis not present

## 2018-03-21 DIAGNOSIS — G20 Parkinson's disease: Secondary | ICD-10-CM | POA: Diagnosis not present

## 2018-03-21 DIAGNOSIS — R1310 Dysphagia, unspecified: Secondary | ICD-10-CM | POA: Diagnosis not present

## 2018-03-23 DIAGNOSIS — G20 Parkinson's disease: Secondary | ICD-10-CM | POA: Diagnosis not present

## 2018-03-23 DIAGNOSIS — I480 Paroxysmal atrial fibrillation: Secondary | ICD-10-CM | POA: Diagnosis not present

## 2018-03-23 DIAGNOSIS — I5032 Chronic diastolic (congestive) heart failure: Secondary | ICD-10-CM | POA: Diagnosis not present

## 2018-03-23 DIAGNOSIS — R1310 Dysphagia, unspecified: Secondary | ICD-10-CM | POA: Diagnosis not present

## 2018-03-23 DIAGNOSIS — N183 Chronic kidney disease, stage 3 (moderate): Secondary | ICD-10-CM | POA: Diagnosis not present

## 2018-03-23 DIAGNOSIS — I69391 Dysphagia following cerebral infarction: Secondary | ICD-10-CM | POA: Diagnosis not present

## 2018-03-26 DIAGNOSIS — G20 Parkinson's disease: Secondary | ICD-10-CM | POA: Diagnosis not present

## 2018-03-26 DIAGNOSIS — I480 Paroxysmal atrial fibrillation: Secondary | ICD-10-CM | POA: Diagnosis not present

## 2018-03-26 DIAGNOSIS — N183 Chronic kidney disease, stage 3 (moderate): Secondary | ICD-10-CM | POA: Diagnosis not present

## 2018-03-26 DIAGNOSIS — R1310 Dysphagia, unspecified: Secondary | ICD-10-CM | POA: Diagnosis not present

## 2018-03-26 DIAGNOSIS — I69391 Dysphagia following cerebral infarction: Secondary | ICD-10-CM | POA: Diagnosis not present

## 2018-03-26 DIAGNOSIS — I5032 Chronic diastolic (congestive) heart failure: Secondary | ICD-10-CM | POA: Diagnosis not present

## 2018-03-27 DIAGNOSIS — G20 Parkinson's disease: Secondary | ICD-10-CM | POA: Diagnosis not present

## 2018-03-27 DIAGNOSIS — R1310 Dysphagia, unspecified: Secondary | ICD-10-CM | POA: Diagnosis not present

## 2018-03-27 DIAGNOSIS — I480 Paroxysmal atrial fibrillation: Secondary | ICD-10-CM | POA: Diagnosis not present

## 2018-03-27 DIAGNOSIS — I5032 Chronic diastolic (congestive) heart failure: Secondary | ICD-10-CM | POA: Diagnosis not present

## 2018-03-27 DIAGNOSIS — N183 Chronic kidney disease, stage 3 (moderate): Secondary | ICD-10-CM | POA: Diagnosis not present

## 2018-03-27 DIAGNOSIS — I69391 Dysphagia following cerebral infarction: Secondary | ICD-10-CM | POA: Diagnosis not present

## 2018-03-30 DIAGNOSIS — I480 Paroxysmal atrial fibrillation: Secondary | ICD-10-CM | POA: Diagnosis not present

## 2018-03-30 DIAGNOSIS — G20 Parkinson's disease: Secondary | ICD-10-CM | POA: Diagnosis not present

## 2018-03-30 DIAGNOSIS — I69391 Dysphagia following cerebral infarction: Secondary | ICD-10-CM | POA: Diagnosis not present

## 2018-03-30 DIAGNOSIS — N183 Chronic kidney disease, stage 3 (moderate): Secondary | ICD-10-CM | POA: Diagnosis not present

## 2018-03-30 DIAGNOSIS — I5032 Chronic diastolic (congestive) heart failure: Secondary | ICD-10-CM | POA: Diagnosis not present

## 2018-03-30 DIAGNOSIS — R1310 Dysphagia, unspecified: Secondary | ICD-10-CM | POA: Diagnosis not present

## 2018-04-02 DIAGNOSIS — I480 Paroxysmal atrial fibrillation: Secondary | ICD-10-CM | POA: Diagnosis not present

## 2018-04-02 DIAGNOSIS — N183 Chronic kidney disease, stage 3 (moderate): Secondary | ICD-10-CM | POA: Diagnosis not present

## 2018-04-02 DIAGNOSIS — G20 Parkinson's disease: Secondary | ICD-10-CM | POA: Diagnosis not present

## 2018-04-02 DIAGNOSIS — R1310 Dysphagia, unspecified: Secondary | ICD-10-CM | POA: Diagnosis not present

## 2018-04-02 DIAGNOSIS — I5032 Chronic diastolic (congestive) heart failure: Secondary | ICD-10-CM | POA: Diagnosis not present

## 2018-04-02 DIAGNOSIS — I69391 Dysphagia following cerebral infarction: Secondary | ICD-10-CM | POA: Diagnosis not present

## 2018-04-03 DIAGNOSIS — I5032 Chronic diastolic (congestive) heart failure: Secondary | ICD-10-CM | POA: Diagnosis not present

## 2018-04-03 DIAGNOSIS — I480 Paroxysmal atrial fibrillation: Secondary | ICD-10-CM | POA: Diagnosis not present

## 2018-04-03 DIAGNOSIS — R1310 Dysphagia, unspecified: Secondary | ICD-10-CM | POA: Diagnosis not present

## 2018-04-03 DIAGNOSIS — I4891 Unspecified atrial fibrillation: Secondary | ICD-10-CM | POA: Diagnosis not present

## 2018-04-03 DIAGNOSIS — Z6831 Body mass index (BMI) 31.0-31.9, adult: Secondary | ICD-10-CM | POA: Diagnosis not present

## 2018-04-03 DIAGNOSIS — E0789 Other specified disorders of thyroid: Secondary | ICD-10-CM | POA: Diagnosis not present

## 2018-04-03 DIAGNOSIS — G20 Parkinson's disease: Secondary | ICD-10-CM | POA: Diagnosis not present

## 2018-04-03 DIAGNOSIS — Z993 Dependence on wheelchair: Secondary | ICD-10-CM | POA: Diagnosis not present

## 2018-04-03 DIAGNOSIS — N183 Chronic kidney disease, stage 3 (moderate): Secondary | ICD-10-CM | POA: Diagnosis not present

## 2018-04-03 DIAGNOSIS — I639 Cerebral infarction, unspecified: Secondary | ICD-10-CM | POA: Diagnosis not present

## 2018-04-03 DIAGNOSIS — I69391 Dysphagia following cerebral infarction: Secondary | ICD-10-CM | POA: Diagnosis not present

## 2018-04-05 DIAGNOSIS — I69391 Dysphagia following cerebral infarction: Secondary | ICD-10-CM | POA: Diagnosis not present

## 2018-04-05 DIAGNOSIS — I5032 Chronic diastolic (congestive) heart failure: Secondary | ICD-10-CM | POA: Diagnosis not present

## 2018-04-05 DIAGNOSIS — N183 Chronic kidney disease, stage 3 (moderate): Secondary | ICD-10-CM | POA: Diagnosis not present

## 2018-04-05 DIAGNOSIS — G20 Parkinson's disease: Secondary | ICD-10-CM | POA: Diagnosis not present

## 2018-04-05 DIAGNOSIS — I480 Paroxysmal atrial fibrillation: Secondary | ICD-10-CM | POA: Diagnosis not present

## 2018-04-05 DIAGNOSIS — R1310 Dysphagia, unspecified: Secondary | ICD-10-CM | POA: Diagnosis not present

## 2018-04-06 DIAGNOSIS — G20 Parkinson's disease: Secondary | ICD-10-CM | POA: Diagnosis not present

## 2018-04-06 DIAGNOSIS — N183 Chronic kidney disease, stage 3 (moderate): Secondary | ICD-10-CM | POA: Diagnosis not present

## 2018-04-06 DIAGNOSIS — I480 Paroxysmal atrial fibrillation: Secondary | ICD-10-CM | POA: Diagnosis not present

## 2018-04-06 DIAGNOSIS — I69391 Dysphagia following cerebral infarction: Secondary | ICD-10-CM | POA: Diagnosis not present

## 2018-04-06 DIAGNOSIS — R1310 Dysphagia, unspecified: Secondary | ICD-10-CM | POA: Diagnosis not present

## 2018-04-06 DIAGNOSIS — I5032 Chronic diastolic (congestive) heart failure: Secondary | ICD-10-CM | POA: Diagnosis not present

## 2018-04-08 DIAGNOSIS — R05 Cough: Secondary | ICD-10-CM | POA: Diagnosis not present

## 2018-04-09 DIAGNOSIS — I5032 Chronic diastolic (congestive) heart failure: Secondary | ICD-10-CM | POA: Diagnosis not present

## 2018-04-09 DIAGNOSIS — I69391 Dysphagia following cerebral infarction: Secondary | ICD-10-CM | POA: Diagnosis not present

## 2018-04-09 DIAGNOSIS — N183 Chronic kidney disease, stage 3 (moderate): Secondary | ICD-10-CM | POA: Diagnosis not present

## 2018-04-09 DIAGNOSIS — R1310 Dysphagia, unspecified: Secondary | ICD-10-CM | POA: Diagnosis not present

## 2018-04-09 DIAGNOSIS — I480 Paroxysmal atrial fibrillation: Secondary | ICD-10-CM | POA: Diagnosis not present

## 2018-04-09 DIAGNOSIS — G20 Parkinson's disease: Secondary | ICD-10-CM | POA: Diagnosis not present

## 2018-04-10 DIAGNOSIS — J180 Bronchopneumonia, unspecified organism: Secondary | ICD-10-CM | POA: Diagnosis not present

## 2018-04-10 DIAGNOSIS — R609 Edema, unspecified: Secondary | ICD-10-CM | POA: Diagnosis not present

## 2018-04-10 DIAGNOSIS — I509 Heart failure, unspecified: Secondary | ICD-10-CM | POA: Diagnosis not present

## 2018-04-10 NOTE — Progress Notes (Signed)
[facility] Telephone: [phone] Fax: [phone]  PATIENT NAME: Jason [name] DOB: September 27, 1927 MRN: [id number] [facility] [place] (admitted 12/02/2016)  PRIMARY CARE PROVIDER:   [name], MD  REFERRING PROVIDER:  [name], [place], [place] [address]  RESPONSIBLE PARTY:   (dtr) Peggye Form [phone] (H), [phone] (M). (dtr) Linda Cutis [phone].   HISTORY OF PRESENT ILLNESS:  Jason [name] is a 85 y.o. male with medical h/o stroke, Parkinson's disease, dysphagia, hyperlipidemia, GERD,dCHF, PAF (Eliquis), CKD-3, remote bladder cancer (excision only), non-Hodgkin lymphoma (s/p ofchemo and radiation therapy, not on chemo currently, following up withDr. Beryle Beams), and aspiration pneumonia (hosp Sept 2019). Palliative Care was asked to help address goals of care.     RECOMMENDATIONS and PLAN:  1. Debilitated state r/t medical illnesses: PPS 40%. He needs some assistance with tolieting, dressing, and hygiene. He ambulates with walker, no falls. Feels progression in his Parkinson's disease contributes to a less steady gait. He has been working with physical therapy and plans rejoin the facility exercise group as his stamina has been improving. Nsg staff report decreased oral intake over the last few weeks, with an increased, non-productive cough worse with eating suspicious for aspiration. He struggles with swallowing his pills, and nsg staff now need to crush his medications. Patient mentions that he has a good appetite, but won't eat foods he doesn't care for. Staff report a decreased oral intake. His current weight is 194 lbs. At height of 5'10" his BMI is 27.8 kg/m2.   2. Fluid volume overload: Bilateral LE edema. Lasix dose recently increased. Denies orthopnea nor dyspnea.   3. Goals of care: LOVES living at [facility], where he feels well looked after by a caring staff. He feelshappy  and content.  4. Advanced Care Directives: Patient sates he would wish for full resuscitative efforts in the event of a cardio-pulmonary arrest, but he really hadn't thought much about this in the past. Would revisit should his condition decline.    I spent 60 minutes providing this consultation,  from 3pm to 4pm. More than 50% of this time was spent coordinating communication.    CODE STATUS: DNR (established during  hospitalization Sept 2019), but currently Full Code at [facility].  PPS: 40% HOSPICE ELIGIBILITY/DIAGNOSIS: TBD  PAST MEDICAL HISTORY:  Past Medical History:  Diagnosis Date  . Arthritis   . Barrett esophagus   . Bladder cancer ([place]) dx'd 9449,6759   surg only  . CHF (congestive heart failure) ([place])   . Colon polyps    adenomatous  . Difficulty sleeping    OCCASIONALLY  . Diverticulosis of colon (without mention of hemorrhage)   . Frequency of urination   . GERD (gastroesophageal reflux disease)   . Hiatal hernia   . History of bladder cancer 1981   EXCISION ONLY  . History of skin cancer    BASAL CELL  . Hyperlipidemia   . Lymphoma, non-Hodgkin's ([place]) 2011   [place]   . Macular degeneration    LEFT EYE  . Multiple nodules of lung 09/16/2011  . PAF (paroxysmal atrial fibrillation) ([place])   . Personal history of colonic polyps 07/15/2010   tubular adenoma  . Prostate cancer [facility])    patient denies  . PUD (peptic ulcer disease)   . Stomach cancer ([place])    NHL origin  . Stroke ([place])   . Weakness     SOCIAL HX:  Social History   Tobacco Use  . Smoking status: Former Smoker    Last attempt to quit: 10/29/1966    Years since quitting: 51.4  . Smokeless tobacco: Former Systems developer    Quit date: 06/29/1998  Substance Use Topics  . Alcohol use: Yes    Comment: Socially    ALLERGIES: No Known Allergies   PERTINENT MEDICATIONS:  Outpatient Encounter Medications as of 04/12/2018  Medication Sig  . apixaban (ELIQUIS) 5 MG TABS tablet Take 5 mg by mouth 2  (two) times daily.  . benzonatate (TESSALON) 100 MG capsule Take 100 mg by mouth every 8 (eight) hours as needed for cough.  . calcium carbonate (TUMS - DOSED IN MG ELEMENTAL CALCIUM) 500 MG chewable tablet Chew 2 tablets by mouth every 8 (eight) hours.  . carbidopa-levodopa (SINEMET IR) 25-100 MG tablet Take 1 tablet by mouth 3 (three) times daily. Take at 8am, 12noon, 5pm  . Cholecalciferol (EQL VITAMIN D3) 25 MCG (1000 UT) tablet Take 2,000 Units by mouth daily. Take 2 tabs (2000 units total) once a day  . Cholecalciferol (VITAMIN D3) 1.25 MG (50000 UT) CAPS Take 50,000 Units by mouth. One cap po q week for 12 weeks  . Docusate Sodium (COLACE PO) Take 100 mg by mouth at bedtime.   Marland Kitchen esomeprazole (NEXIUM) 40 MG capsule TAKE 1 CAPSULE (40 MG TOTAL) BY MOUTH 2 (TWO) TIMES DAILY BEFORE A MEAL.  . ferrous sulfate 325 (65 FE) MG tablet Take 325 mg by mouth daily with breakfast.  . fluticasone (FLONASE) 50 MCG/ACT nasal spray Place 2 sprays into both nostrils daily.  . furosemide (LASIX) 40 MG tablet Take 40 mg by mouth 2 (two) times daily.  Marland Kitchen ipratropium (ATROVENT) 0.06 % nasal spray Place 2 sprays into both nostrils 4 (four) times daily.  Marland Kitchen loperamide (IMODIUM A-D) 2 MG tablet Take 4 mg by mouth 4 (four) times daily as needed for diarrhea or loose stools.  . magnesium oxide (MAG-OX) 400 MG tablet Take 400 mg by mouth 2 (two) times daily.  . Melatonin 3 MG TABS Take 3 mg by mouth at bedtime.  . metoprolol tartrate (LOPRESSOR) 25 MG tablet Take 25 mg by mouth 2 (two) times daily.  . ondansetron (ZOFRAN) 4 MG tablet Take 4 mg by mouth every 8 (eight) hours as needed for nausea or vomiting.  Vladimir Faster Glycol-Propyl Glycol (SYSTANE) 0.4-0.3 % SOLN Place 1 drop into both eyes daily as needed (for dry eyes).   . potassium chloride SA (K-DUR,KLOR-CON) 20 MEQ tablet Take 2 tablets (40 mEq total) by mouth daily. (Patient taking differently: Take 20 mEq by mouth 2 (two) times daily. Take 1 tab with each dose  Lasix)  . simvastatin (ZOCOR) 20 MG tablet Take 20 mg by mouth every evening.   . Skin Protectants, Misc. (DIMETHICONE-ZINC OXIDE) cream Apply topically 2 (two) times daily as needed for dry skin.  Marland Kitchen Specialty Vitamins Products (ICAPS LUTEIN-ZEAXANTHIN PO) Take 1 tablet by mouth 2 (two) times daily.  . tamsulosin (FLOMAX) 0.4 MG CAPS capsule Take 0.4 mg by mouth at bedtime.  . traMADol (ULTRAM) 50 MG tablet Take 1 tablet (50 mg total) by mouth every 8 (eight) hours as needed for moderate pain.  Marland Kitchen triamcinolone cream (KENALOG) 0.1 % Apply 1 application topically 2 (two) times daily as needed.  . [DISCONTINUED] acetaminophen (TYLENOL) 325 MG tablet Take 2 tablets (650 mg total) by mouth every 6 (six) hours as needed for fever, headache or moderate pain.  . [DISCONTINUED]  fish oil-omega-3 fatty acids 1000 MG capsule Take 1 g by mouth 2 (two) times daily.   . [DISCONTINUED] Multiple Vitamins-Minerals (ICAPS MV PO) Take 1 capsule by mouth 2 (two) times daily.   . [DISCONTINUED] senna-docusate (SENOKOT-S) 8.6-50 MG tablet Take 1 tablet by mouth daily as needed for mild constipation.   No facility-administered encounter medications on file as of 04/12/2018.     PHYSICAL EXAM:  Well nourished, pleasantly conversant elderly male sitting up in his recliner. He occasionally drools from his Parkinson's Cardiovascular: regular rate and rhythm without MRG Pulmonary: R posterior base harsh inspiratory sounds which clears somewhat with cough. Left LL egophony. Abdomen: soft, nontender, + bowel sounds GU: no suprapubic tenderness Extremities: no edema, no joint deformities Skin: no rashes Neurological: Weakness but otherwise nonfocal  Julianne Handler, NP

## 2018-04-12 ENCOUNTER — Encounter: Payer: Medicare Other | Admitting: Licensed Clinical Social Worker

## 2018-04-12 ENCOUNTER — Encounter: Payer: Self-pay | Admitting: Internal Medicine

## 2018-04-12 ENCOUNTER — Non-Acute Institutional Stay: Payer: Medicare Other | Admitting: Internal Medicine

## 2018-04-12 VITALS — BP 110/56 | HR 76 | Resp 20 | Ht 70.0 in | Wt 194.6 lb

## 2018-04-12 DIAGNOSIS — Z515 Encounter for palliative care: Secondary | ICD-10-CM | POA: Diagnosis not present

## 2018-04-26 DIAGNOSIS — R410 Disorientation, unspecified: Secondary | ICD-10-CM | POA: Diagnosis not present

## 2018-05-01 DIAGNOSIS — G20 Parkinson's disease: Secondary | ICD-10-CM | POA: Diagnosis not present

## 2018-05-01 DIAGNOSIS — E559 Vitamin D deficiency, unspecified: Secondary | ICD-10-CM | POA: Diagnosis not present

## 2018-05-01 DIAGNOSIS — I4891 Unspecified atrial fibrillation: Secondary | ICD-10-CM | POA: Diagnosis not present

## 2018-05-01 DIAGNOSIS — Z993 Dependence on wheelchair: Secondary | ICD-10-CM | POA: Diagnosis not present

## 2018-05-01 DIAGNOSIS — I639 Cerebral infarction, unspecified: Secondary | ICD-10-CM | POA: Diagnosis not present

## 2018-05-01 DIAGNOSIS — Z6831 Body mass index (BMI) 31.0-31.9, adult: Secondary | ICD-10-CM | POA: Diagnosis not present

## 2018-05-04 ENCOUNTER — Non-Acute Institutional Stay: Payer: Medicare Other | Admitting: Licensed Clinical Social Worker

## 2018-05-04 ENCOUNTER — Ambulatory Visit (INDEPENDENT_AMBULATORY_CARE_PROVIDER_SITE_OTHER): Payer: Medicare Other | Admitting: Cardiology

## 2018-05-04 ENCOUNTER — Encounter: Payer: Self-pay | Admitting: Cardiology

## 2018-05-04 VITALS — BP 112/64 | HR 68 | Ht 70.0 in | Wt 194.2 lb

## 2018-05-04 DIAGNOSIS — I480 Paroxysmal atrial fibrillation: Secondary | ICD-10-CM

## 2018-05-04 DIAGNOSIS — Z8673 Personal history of transient ischemic attack (TIA), and cerebral infarction without residual deficits: Secondary | ICD-10-CM | POA: Insufficient documentation

## 2018-05-04 DIAGNOSIS — I77810 Thoracic aortic ectasia: Secondary | ICD-10-CM

## 2018-05-04 DIAGNOSIS — I5032 Chronic diastolic (congestive) heart failure: Secondary | ICD-10-CM

## 2018-05-04 DIAGNOSIS — G20 Parkinson's disease: Secondary | ICD-10-CM | POA: Diagnosis not present

## 2018-05-04 DIAGNOSIS — Z515 Encounter for palliative care: Secondary | ICD-10-CM

## 2018-05-04 NOTE — Patient Instructions (Signed)
Medication Instructions:  1) Please make sure you are taking Metoprolol Tartrate 25mg  twice daily 2) Continue Lasix 40mg  twice daily  If you need a refill on your cardiac medications before your next appointment, please call your pharmacy.   Lab work: None If you have labs (blood work) drawn today and your tests are completely normal, you will receive your results only by: Marland Kitchen MyChart Message (if you have MyChart) OR . A paper copy in the mail If you have any lab test that is abnormal or we need to change your treatment, we will call you to review the results.  Testing/Procedures: None  Follow-Up: Your physician recommends that you schedule a follow-up appointment as needed with Dr. Marlou Porch.    Any Other Special Instructions Will Be Listed Below (If Applicable).

## 2018-05-04 NOTE — Progress Notes (Signed)
Cardiology Office Note:    Date:  05/04/2018   ID:  Jason [name], Jason [name] [birth year], MRN [id number]  PCP:  [name], MD  Cardiologist:  No primary care provider on file.  Electrophysiologist:  None   Referring MD: [name], MD     History of Present Illness:    Jason [name] is a 85 y.o. male former patient of Dr. Wynonia Lawman referred here by Dr. Harrington Challenger for the evaluation of chronic diastolic heart failure.  Has paroxysmal atrial fibrillation on Eliquis CKD 3 non-Hodgkin's lymphoma Parkinson's disease, Dr. Beryle Beams, had aspiration pneumonia saw infectious disease, DNR recommended.  Paroxysmal atrial fibrillation-most recently in sinus rhythm.  On Eliquis.  Has history of stroke as well as diastolic heart failure.  Oral diuretics being utilized.  Prior hospital records reviewed.  CHADSVASc score at least 5-on Eliquis  Came in in a wheelchair.  Has been seen by community palliative care.  Quite debilitated.  Needs some assistance with various activities.  His lower extremity edema was noted and his Lasix dose was increased but he denied any dyspnea.  He enjoys living at [facility].   Recently has not been walking to the dining hall because of recent cough, needed azithromycin.  Overall lower extremity edema is quite chronic.  Trying to elevate legs in a recliner.  He is having no dyspnea.  No chest pain.  No bleeding with Eliquis.  Quite stable.  There may have been some discrepancy on whether or not he is taking metoprolol.  This would be a good medication for him to be on with his atrial fibrillation.    Past Medical History:  Diagnosis Date  . Arthritis   . Barrett esophagus   . Bladder cancer ([place]) dx'd 1749,4496   surg only  . CHF (congestive heart failure) ([place])   . Colon polyps    adenomatous  . Difficulty sleeping    OCCASIONALLY  . Diverticulosis of colon (without mention of hemorrhage)   . Frequency of urination   . GERD (gastroesophageal reflux  disease)   . Hiatal hernia   . History of bladder cancer 1981   EXCISION ONLY  . History of skin cancer    BASAL CELL  . Hyperlipidemia   . Lymphoma, non-Hodgkin's ([place]) 2011   [place]   . Macular degeneration    LEFT EYE  . Multiple nodules of lung 09/16/2011  . PAF (paroxysmal atrial fibrillation) ([place])   . Personal history of colonic polyps 07/15/2010   tubular adenoma  . Prostate cancer [facility])    patient denies  . PUD (peptic ulcer disease)   . Stomach cancer ([place])    NHL origin  . Stroke ([place])   . Weakness     Past Surgical History:  Procedure Laterality Date  . APPENDECTOMY    . bone spur     left shoulder  . CHOLECYSTECTOMY    . KNEE ARTHROPLASTY Left 07/30/2015   Procedure: LEFT TOTAL KNEE ARTHROPLASTY WITH COMPUTER NAVIGATION;  Surgeon: Rod Can, MD;  Location: [facility] ORS;  Service: Orthopedics;  Laterality: Left;  . LASER OF PROSTATE W/ GREEN LIGHT PVP    . TENDON REPAIR     right arm  . TRIGGER FINGER RELEASE     bilateral hands  . WRIST SURGERY     Bilateral    Current Medications: Current Meds  Medication Sig  . apixaban (ELIQUIS) 5 MG TABS tablet Take 5 mg by mouth 2 (two) times daily.  Marland Kitchen  benzonatate (TESSALON) 100 MG capsule Take 100 mg by mouth every 8 (eight) hours as needed for cough.  . calcium carbonate (TUMS - DOSED IN MG ELEMENTAL CALCIUM) 500 MG chewable tablet Chew 2 tablets by mouth every 8 (eight) hours.  . carbidopa-levodopa (SINEMET IR) 25-100 MG tablet Take 1 tablet by mouth 3 (three) times daily. Take at 8am, 12noon, 5pm  . Cholecalciferol (EQL VITAMIN D3) 25 MCG (1000 UT) tablet Take 2,000 Units by mouth daily. Take 2 tabs (2000 units total) once a day  . Cholecalciferol (VITAMIN D3) 1.25 MG (50000 UT) CAPS Take 50,000 Units by mouth. One cap po q week for 12 weeks  . Docusate Sodium (COLACE PO) Take 100 mg by mouth at bedtime.   Marland Kitchen esomeprazole (NEXIUM) 40 MG capsule TAKE 1 CAPSULE (40 MG TOTAL) BY MOUTH 2 (TWO) TIMES DAILY BEFORE  A MEAL.  . ferrous sulfate 325 (65 FE) MG tablet Take 325 mg by mouth daily with breakfast.  . fluticasone (FLONASE) 50 MCG/ACT nasal spray Place 2 sprays into both nostrils daily.  . furosemide (LASIX) 40 MG tablet Take 40 mg by mouth 2 (two) times daily.  Marland Kitchen ipratropium (ATROVENT) 0.06 % nasal spray Place 2 sprays into both nostrils 4 (four) times daily.  Marland Kitchen loperamide (IMODIUM A-D) 2 MG tablet Take 4 mg by mouth 4 (four) times daily as needed for diarrhea or loose stools.  . magnesium oxide (MAG-OX) 400 MG tablet Take 400 mg by mouth 2 (two) times daily.  . Melatonin 3 MG TABS Take 3 mg by mouth at bedtime.  . metoprolol tartrate (LOPRESSOR) 25 MG tablet Take 25 mg by mouth 2 (two) times daily.  . ondansetron (ZOFRAN) 4 MG tablet Take 4 mg by mouth every 8 (eight) hours as needed for nausea or vomiting.  Vladimir Faster Glycol-Propyl Glycol (SYSTANE) 0.4-0.3 % SOLN Place 1 drop into both eyes daily as needed (for dry eyes).   . potassium chloride SA (K-DUR,KLOR-CON) 20 MEQ tablet Take 2 tablets (40 mEq total) by mouth daily. (Patient taking differently: Take 20 mEq by mouth 2 (two) times daily. Take 1 tab with each dose Lasix)  . simvastatin (ZOCOR) 20 MG tablet Take 20 mg by mouth every evening.   . Skin Protectants, Misc. (DIMETHICONE-ZINC OXIDE) cream Apply topically 2 (two) times daily as needed for dry skin.  Marland Kitchen Specialty Vitamins Products (ICAPS LUTEIN-ZEAXANTHIN PO) Take 1 tablet by mouth 2 (two) times daily.  . tamsulosin (FLOMAX) 0.4 MG CAPS capsule Take 0.4 mg by mouth at bedtime.  . traMADol (ULTRAM) 50 MG tablet Take 1 tablet (50 mg total) by mouth every 8 (eight) hours as needed for moderate pain.  Marland Kitchen triamcinolone cream (KENALOG) 0.1 % Apply 1 application topically 2 (two) times daily as needed.     Allergies:   Patient has no known allergies.   Social History   Socioeconomic History  . Marital status: Widowed    Spouse name: Not on file  . Number of children: 2  . Years of  education: HS  . Highest education level: Not on file  Occupational History  . Occupation: retired    Fish farm manager: RETIRED  Social Needs  . Financial resource strain: Not on file  . Food insecurity:    Worry: Not on file    Inability: Not on file  . Transportation needs:    Medical: Not on file    Non-medical: Not on file  Tobacco Use  . Smoking status: Former Smoker  Last attempt to quit: 10/29/1966    Years since quitting: 51.5  . Smokeless tobacco: Former Systems developer    Quit date: 06/29/1998  Substance and Sexual Activity  . Alcohol use: Yes    Comment: Socially  . Drug use: No  . Sexual activity: Not Currently    Birth control/protection: None  Lifestyle  . Physical activity:    Days per week: Not on file    Minutes per session: Not on file  . Stress: Not on file  Relationships  . Social connections:    Talks on phone: Not on file    Gets together: Not on file    Attends religious service: Not on file    Active member of club or organization: Not on file    Attends meetings of clubs or organizations: Not on file    Relationship status: Not on file  Other Topics Concern  . Not on file  Social History Narrative   Lives at home alone.   Right-handed.   Occasional use of caffeine.     Family History: The patient's family history includes Heart attack in his father; Other in his mother.  ROS:   Please see the history of present illness.    No fevers chills nausea vomiting syncope, no bleeding all other systems reviewed and are negative.  EKGs/Labs/Other Studies Reviewed:    The following studies were reviewed today:  Echocardiogram 06/17/2016: - Left ventricle: The cavity size was normal. Wall thickness was   increased in a pattern of severe LVH. Systolic function was   normal. The estimated ejection fraction was in the range of 55%   to 60%. Although no diagnostic regional wall motion abnormality   was identified, this possibility cannot be completely excluded on    the basis of this study. Doppler parameters are consistent with   abnormal left ventricular relaxation (grade 1 diastolic   dysfunction). - Aortic valve: Trileaflet; moderately calcified leaflets. There   was no stenosis. - Aorta: Mildly dilated aortic root and ascending aorta. Aortic   root dimension: 40 mm (ED). Ascending aortic diameter: 38 mm (S). - Mitral valve: Mildly calcified annulus. - Right ventricle: The cavity size was mildly dilated. Systolic   function was normal. - Tricuspid valve: Peak RV-RA gradient (S): 28 mm Hg. - Systemic veins: IVC was not visualized.  Impressions:  - Technically difficult study with poor acoustic windows. Normal LV   size with severe LV hypertrophy. EF 55-60. Mildly dilated RV with   normal systolic function. No significant valvular abnormalities.   EKG:  EKG is not ordered today.  Prior as above Recent Labs: 12/22/2017: ALT 6; B Natriuretic Peptide 443.8 12/26/2017: BUN 13; Creatinine, Ser 1.49; Hemoglobin 13.3; Platelets 184; Potassium 3.4; Sodium 141  Recent Lipid Panel    Component Value Date/Time   CHOL 123 07/01/2016 0746   TRIG 52 07/01/2016 0746   HDL 45 07/01/2016 0746   CHOLHDL 2.7 07/01/2016 0746   VLDL 10 07/01/2016 0746   LDLCALC 68 07/01/2016 0746    Physical Exam:    VS:  BP 112/64   Pulse 68   Ht 5\' 10"  (1.778 m)   Wt 194 lb 3.2 oz (88.1 kg)   SpO2 98%   BMI 27.86 kg/m     Wt Readings from Last 3 Encounters:  05/04/18 194 lb 3.2 oz (88.1 kg)  04/12/18 194 lb 9.6 oz (88.3 kg)  12/23/17 206 lb 12.7 oz (93.8 kg)     GEN: In wheelchair, quiet,  dozing off at times, daughter in room well nourished, well developed in no acute distress HEENT: Normal NECK: No JVD; No carotid bruits LYMPHATICS: No lymphadenopathy CARDIAC: RRR with occasional ectopy normal rate, no murmurs, rubs, gallops RESPIRATORY:  Clear to auscultation without rales, wheezing or rhonchi  ABDOMEN: Soft, non-tender, non-distended MUSCULOSKELETAL:   2+ BLE edema; No deformity  SKIN: Warm and dry NEUROLOGIC:  Alert occasionally sleepy PSYCHIATRIC:  Normal affect, pleasant  ASSESSMENT:    1. Paroxysmal atrial fibrillation (HCC)   2. History of stroke   3. Parkinson's disease ([place])   4. Chronic diastolic (congestive) heart failure ([place])   5. Dilated aortic root (HCC)    PLAN:    In order of problems listed above:  Paroxysmal atrial fibrillation -During hospitalization in September 2019 he was in normal sinus at the end but originally was in atrial fibrillation.  Continuing with Eliquis given his high chads vasc score of at least 5.  History of stroke - Eliquis, statin for prevention  Parkinson's disease -Sinemet.  Parkinson's contributing to his aspiration event in September.  Chronic diastolic heart failure - Echocardiogram showed normal EF he was euvolemic during hospitalization. -On low-dose metoprolol 25 twice daily -Lasix 40 mg twice daily.  Watch salt intake.  Maintain fluids less than 2 L a day.  Mildly dilated aortic root -40 mm / 38 mm.  Should be no clinical consequence.  Continue to treat risk factors.  Chronic kidney disease stage III -1.49 baseline.  Hyperlipidemia -On simvastatin 20.  He is very stable with regards to his atrial fibrillation diastolic dysfunction.  Lower extremity edema could be helped with TED hose or compression hose as well as elevation of feet.  He is doing this in his recliner.  Okay to continue with Lasix.  No dyspnea.  We can see him back on as-needed basis.  Please let us know if we can be of further assistance.  Medication Adjustments/Labs and Tests Ordered: Current medicines are reviewed at length with the patient today.  Concerns regarding medicines are outlined above.  No orders of the defined types were placed in this encounter.  No orders of the defined types were placed in this encounter.   Patient Instructions  Medication Instructions:  1) Please make sure you are  taking Metoprolol Tartrate 25mg  twice daily 2) Continue Lasix 40mg  twice daily  If you need a refill on your cardiac medications before your next appointment, please call your pharmacy.   Lab work: None If you have labs (blood work) drawn today and your tests are completely normal, you will receive your results only by: Marland Kitchen MyChart Message (if you have MyChart) OR . A paper copy in the mail If you have any lab test that is abnormal or we need to change your treatment, we will call you to review the results.  Testing/Procedures: None  Follow-Up: Your physician recommends that you schedule a follow-up appointment as needed with Dr. Marlou Porch.    Any Other Special Instructions Will Be Listed Below (If Applicable).       Signed, Candee Furbish, MD  05/04/2018 9:12 AM    [place]

## 2018-05-07 NOTE — Progress Notes (Signed)
COMMUNITY PALLIATIVE CARE SW NOTE  PATIENT NAME: NIKO PENSON DOB: 03/02/1928 MRN: [id number]  PRIMARY CARE PROVIDER: [name], MD  RESPONSIBLE PARTY:  Acct ID - Guarantor Home Phone Work Phone Relationship Acct Type  [id number] Tresa [id number] [phone] Self P/F     [id number], [place], [place] [address]     PLAN OF CARE and INTERVENTIONS:             1. GOALS OF CARE/ ADVANCE CARE PLANNING:  Patient wishes to remain at [facility].  He is a full code. 2. SOCIAL/EMOTIONAL/SPIRITUAL ASSESSMENT/ INTERVENTIONS:  SW met with patient in his apartment at [facility].  He was initially observed walking with his walker.  He then sad down in his recliner.  He welcomed SW visit and answered questions appropriately.  He was alert and oriented to self, place and time.  Staff reported that they have observed a decrease in cognitive skills.  SW provided active listening and supportive counseling. 3. PATIENT/CAREGIVER EDUCATION/ COPING:  Patient expresses his feelings openly.  Provided education regarding Palliative Care. 4. PERSONAL EMERGENCY PLAN:  Per facility protocol. 5. COMMUNITY RESOURCES COORDINATION/ HEALTH CARE NAVIGATION:  None. 6. FINANCIAL/LEGAL CONCERNS/INTERVENTIONS:  None.     SOCIAL HX:  Social History   Tobacco Use  . Smoking status: Former Smoker    Last attempt to quit: 10/29/1966    Years since quitting: 51.5  . Smokeless tobacco: Former Systems developer    Quit date: 06/29/1998  Substance Use Topics  . Alcohol use: Yes    Comment: Socially    CODE STATUS:  Full Code  ADVANCED DIRECTIVES: N MOST FORM COMPLETE:  N HOSPICE EDUCATION PROVIDED: N PPS:  Patient reports his appetite is normal.  He is able to stand independently and uses a walker. Duration of visit and documentation:  40 minutes.      Creola Corn Kursten Kruk, LCSW

## 2018-05-10 ENCOUNTER — Non-Acute Institutional Stay: Payer: Medicare Other | Admitting: Licensed Clinical Social Worker

## 2018-05-10 DIAGNOSIS — Z515 Encounter for palliative care: Secondary | ICD-10-CM

## 2018-05-10 NOTE — Progress Notes (Signed)
COMMUNITY PALLIATIVE CARE SW NOTE  PATIENT NAME: Jason [name] DOB: 1928-01-30 MRN: [id number]  PRIMARY CARE PROVIDER: [name], MD  RESPONSIBLE PARTY:  Acct ID - Guarantor Home Phone Work Phone Relationship Acct Type  [id number] Tresa [phone] Self P/F     [facility], [address], [place], [place] [address]     PLAN OF CARE and INTERVENTIONS:             1. GOALS OF CARE/ ADVANCE CARE PLANNING:  Goal is for patient to remain at the facility.  He remains a full code. 2. SOCIAL/EMOTIONAL/SPIRITUAL ASSESSMENT/ INTERVENTIONS:  SW met with patient in his room at [facility].  He did not recall SW from a previous visit.  He stated he would not be able to visit due to needing his medication and desire to attend a facility activity.  He was open to a future visit.  He did not display any nonverbal indicators of pain. 3. PATIENT/CAREGIVER EDUCATION/ COPING:  Patient copes by expressing his feelings openly. 4. PERSONAL EMERGENCY PLAN:  Per facility protocol. 5. COMMUNITY RESOURCES COORDINATION/ HEALTH CARE NAVIGATION:  None. 6. FINANCIAL/LEGAL CONCERNS/INTERVENTIONS:  None.     SOCIAL HX:  Social History   Tobacco Use  . Smoking status: Former Smoker    Last attempt to quit: 10/29/1966    Years since quitting: 51.5  . Smokeless tobacco: Former Systems developer    Quit date: 06/29/1998  Substance Use Topics  . Alcohol use: Yes    Comment: Socially    CODE STATUS:  Full Code  ADVANCED DIRECTIVES: N MOST FORM COMPLETE:  N HOSPICE EDUCATION PROVIDED:  N PPS: Patient is able to stand with the assistance of his lift chair.  He ambulates with a walker, but appears unsteady. Duration of visit and documentation:  30 minutes.      Creola Corn Yameli Delamater, LCSW

## 2018-05-14 ENCOUNTER — Ambulatory Visit (INDEPENDENT_AMBULATORY_CARE_PROVIDER_SITE_OTHER): Payer: Medicare Other | Admitting: Neurology

## 2018-05-14 ENCOUNTER — Encounter: Payer: Self-pay | Admitting: Neurology

## 2018-05-14 VITALS — BP 111/69 | HR 78

## 2018-05-14 DIAGNOSIS — R413 Other amnesia: Secondary | ICD-10-CM | POA: Diagnosis not present

## 2018-05-14 DIAGNOSIS — G20 Parkinson's disease: Secondary | ICD-10-CM

## 2018-05-14 DIAGNOSIS — R269 Unspecified abnormalities of gait and mobility: Secondary | ICD-10-CM

## 2018-05-14 MED ORDER — MEMANTINE HCL 10 MG PO TABS: 10.0000 mg | ORAL_TABLET | Freq: Two times a day (BID) | ORAL | 11 refills | Status: DC

## 2018-05-14 MED ORDER — CARBIDOPA-LEVODOPA 25-100 MG PO TABS: 1.0000 | ORAL_TABLET | Freq: Three times a day (TID) | ORAL | 11 refills | Status: DC

## 2018-05-14 NOTE — Progress Notes (Signed)
PATIENT: Jason [name] DOB: [birth year]  Chief Complaint  Patient presents with  . Memory Loss    MMSE 25/30 - 5 animals.  He resides in [facility].  He is here with his daughter, [name].  She has noticed increased confusion, especially about his location.  . Parkinsonian Syndrome    He is still using Sinemet 25-100mg , one tablet at 8am, noon and 5pm. He is going to exercise classes daily.  He is able to ambulate some with a rolling walker.   Marland Kitchen Hx of CVA    He is on Eliquis 5mg  daily.      HISTORICAL  Jason [name] is a 85 year old right-handed male, seen in refer by hospital to follow-up on stroke, his primary care physician is Dr. Zenia Resides loss, is accompanied by his daughter [name] at today's clinical visit, initial evaluation was on June fourth 2018.  I reviewed and summarized his most recent hospital discharge on July 07 2016,  He has past medical history of proximal atrial fibrillation, on Eliquis, Barrett's esophagus, bladder cancer, non-Hodgkin's lymphoma, was treated with chemo and radiation therapy in 2011, osteoarthritis, hyperlipidemia, he presented with acute stroke on March 29th 2018, right-sided facial droop, right upper and lower extremity weakness, I have personally reviewed MRI of the brain showed left paramedian pontine infarction secondary to small vessel disease, CT angiogram of head and neck showed no significant large vessel abnormality.  Pan CT negative for malignancy, LDL was 68, A1c was 5.5, UDS was negative, myasthenia gravis panel was negative, CMP showed potassium of 3.2, CBC showed hemoglobin of 11.3, normal B12, A1c was 5.5, LDL was 68, cholesterol 123, LDL 68, normal TSH, CPK was 125, negative HIV,  He was also admitted to the hospital earlier for community-acquired pneumonia, presented with generalized weakness on June 14 2016, fell next morning, was take to hospital on March 14 to 19, 2018,  during hospital admission, he had an event of  unresponsiveness bradykinesia,he was noted to have mild aspiration   Echocardiogram ejection 55-60%, no regional wall motion abnormality identified  He fell in 2017, was noted to have drooling eyelid, unsteady gait, began to use walker since then, his drooling, and mild swallowing difficulty has much improved with current speech physical therapy, is also receiving PT OT,   UPDATE Jan 12 2017: He was admitted to the hospital on October 28 2016 after fell at home, had left posterior 9,10, 11 rib fractures, and small left hemopneumothorax, T9 and T10 mildly displaced transverse process fracture, left T6 nondisplaced inferior facet fracture, bilateral lower lobe bronchopneumonia,  Laboratory evaluation showed hemoglobin of 10.9, BMP showed creatinine 1.11,  Now he is at [facility], he is now only Eliquis 5 mg twice a day, omeprazole 40 mg twice a day, tamsulosin 0.4 milligrams at bedtime , he is also taking his Sinemet and 25/100 mg twice a day at 8 AM, 8 PM,  He can walk around in his room with walker, eat at dinning room, needing help dressing and bathing, prior to assistant living,, he was taking sinemet 25/100. 3 times a day, daughter reported that it did help his walking  UPDATE May 24 2018: He is accompanied by his daughter at today's clinical visit, he still stays at assisted living, overall doing well, no significant change, Sinemet 25/100 mg 3 times daily was helpful   REVIEW OF SYSTEMS: Full 14 as,  system review of systems performed and notable only for activity change, drooling, cough, depression, daytime sleepiness  All rest review of the system were negative  ALLERGIES: No Known Allergies  HOME MEDICATIONS: Current Outpatient Medications  Medication Sig Dispense Refill  . apixaban (ELIQUIS) 5 MG TABS tablet Take 5 mg by mouth 2 (two) times daily.    . benzonatate (TESSALON) 100 MG capsule Take 100 mg by mouth every 8 (eight) hours as needed for cough.  0  . calcium carbonate  (TUMS - DOSED IN MG ELEMENTAL CALCIUM) 500 MG chewable tablet Chew 2 tablets by mouth every 8 (eight) hours.    . carbidopa-levodopa (SINEMET IR) 25-100 MG tablet Take 1 tablet by mouth 3 (three) times daily. Take at 8am, 12noon, 5pm 90 tablet 11  . Cholecalciferol (EQL VITAMIN D3) 25 MCG (1000 UT) tablet Take 2,000 Units by mouth daily. Take 2 tabs (2000 units total) once a day    . Cholecalciferol (VITAMIN D3) 1.25 MG (50000 UT) CAPS Take 50,000 Units by mouth. One cap po q week for 12 weeks    . Docusate Sodium (COLACE PO) Take 100 mg by mouth at bedtime.     Marland Kitchen esomeprazole (NEXIUM) 40 MG capsule TAKE 1 CAPSULE (40 MG TOTAL) BY MOUTH 2 (TWO) TIMES DAILY BEFORE A MEAL. 60 capsule 6  . ferrous sulfate 325 (65 FE) MG tablet Take 325 mg by mouth daily with breakfast.    . fluticasone (FLONASE) 50 MCG/ACT nasal spray Place 2 sprays into both nostrils daily.    . furosemide (LASIX) 40 MG tablet Take 40 mg by mouth 2 (two) times daily.    Marland Kitchen ipratropium (ATROVENT) 0.06 % nasal spray Place 2 sprays into both nostrils 4 (four) times daily.    Marland Kitchen loperamide (IMODIUM A-D) 2 MG tablet Take 4 mg by mouth 4 (four) times daily as needed for diarrhea or loose stools.    . magnesium oxide (MAG-OX) 400 MG tablet Take 400 mg by mouth 2 (two) times daily.    . Melatonin 3 MG TABS Take 3 mg by mouth at bedtime.    . metoprolol tartrate (LOPRESSOR) 25 MG tablet Take 25 mg by mouth 2 (two) times daily.    . ondansetron (ZOFRAN) 4 MG tablet Take 4 mg by mouth every 8 (eight) hours as needed for nausea or vomiting.    Jason [name] Glycol-Propyl Glycol (SYSTANE) 0.4-0.3 % SOLN Place 1 drop into both eyes daily as needed (for dry eyes).     . potassium chloride SA (K-DUR,KLOR-CON) 20 MEQ tablet Take 2 tablets (40 mEq total) by mouth daily. (Patient taking differently: Take 20 mEq by mouth 2 (two) times daily. Take 1 tab with each dose Lasix)    . simvastatin (ZOCOR) 20 MG tablet Take 20 mg by mouth every evening.     . Skin  Protectants, Misc. (DIMETHICONE-ZINC OXIDE) cream Apply topically 2 (two) times daily as needed for dry skin.    Marland Kitchen Specialty Vitamins Products (ICAPS LUTEIN-ZEAXANTHIN PO) Take 1 tablet by mouth 2 (two) times daily.    . tamsulosin (FLOMAX) 0.4 MG CAPS capsule Take 0.4 mg by mouth at bedtime.    . traMADol (ULTRAM) 50 MG tablet Take 1 tablet (50 mg total) by mouth every 8 (eight) hours as needed for moderate pain. 5 tablet 0  . triamcinolone cream (KENALOG) 0.1 % Apply 1 application topically 2 (two) times daily as needed.     No current facility-administered medications for this visit.     PAST MEDICAL HISTORY: Past Medical History:  Diagnosis Date  . Arthritis   .  Barrett esophagus   . Bladder cancer ([place]) dx'd 1700,1749   surg only  . CHF (congestive heart failure) ([place])   . Colon polyps    adenomatous  . Difficulty sleeping    OCCASIONALLY  . Diverticulosis of colon (without mention of hemorrhage)   . Frequency of urination   . GERD (gastroesophageal reflux disease)   . Hiatal hernia   . History of bladder cancer 1981   EXCISION ONLY  . History of skin cancer    BASAL CELL  . Hyperlipidemia   . Lymphoma, non-Hodgkin's ([place]) 2011   [place]   . Macular degeneration    LEFT EYE  . Multiple nodules of lung 09/16/2011  . PAF (paroxysmal atrial fibrillation) ([place])   . Personal history of colonic polyps 07/15/2010   tubular adenoma  . Prostate cancer [facility])    patient denies  . PUD (peptic ulcer disease)   . Stomach cancer ([place])    NHL origin  . Stroke ([place])   . Weakness     PAST SURGICAL HISTORY: Past Surgical History:  Procedure Laterality Date  . APPENDECTOMY    . bone spur     left shoulder  . CHOLECYSTECTOMY    . KNEE ARTHROPLASTY Left 07/30/2015   Procedure: LEFT TOTAL KNEE ARTHROPLASTY WITH COMPUTER NAVIGATION;  Surgeon: Rod Can, MD;  Location: [facility];  Service: Orthopedics;  Laterality: Left;  . LASER OF PROSTATE W/ GREEN LIGHT PVP    . TENDON  REPAIR     right arm  . TRIGGER FINGER RELEASE     bilateral hands  . WRIST SURGERY     Bilateral    FAMILY HISTORY: Family History  Problem Relation Age of Onset  . Heart attack Father   . Other Mother        "old age"    SOCIAL HISTORY:  Social History   Socioeconomic History  . Marital status: Widowed    Spouse name: Not on file  . Number of children: 2  . Years of education: HS  . Highest education level: Not on file  Occupational History  . Occupation: retired    Fish farm manager: RETIRED  Social Needs  . Financial resource strain: Not on file  . Food insecurity:    Worry: Not on file    Inability: Not on file  . Transportation needs:    Medical: Not on file    Non-medical: Not on file  Tobacco Use  . Smoking status: Former Smoker    Last attempt to quit: 10/29/1966    Years since quitting: 51.5  . Smokeless tobacco: Former Systems developer    Quit date: 06/29/1998  Substance and Sexual Activity  . Alcohol use: Yes    Comment: Socially  . Drug use: No  . Sexual activity: Not Currently    Birth control/protection: None  Lifestyle  . Physical activity:    Days per week: Not on file    Minutes per session: Not on file  . Stress: Not on file  Relationships  . Social connections:    Talks on phone: Not on file    Gets together: Not on file    Attends religious service: Not on file    Active member of club or organization: Not on file    Attends meetings of clubs or organizations: Not on file    Relationship status: Not on file  . Intimate partner violence:    Fear of current or ex partner: Not on file  Emotionally abused: Not on file    Physically abused: Not on file    Forced sexual activity: Not on file  Other Topics Concern  . Not on file  Social History Narrative   Lives at home alone.   Right-handed.   Occasional use of caffeine.     PHYSICAL EXAM   Vitals:   05/14/18 1202  BP: 111/69  Pulse: 78    Not recorded      There is no height or weight  on file to calculate BMI.  PHYSICAL EXAMNIATION:  Gen: NAD, conversant, well nourised, obese, well groomed                     Cardiovascular: Regular rate rhythm, no peripheral edema, warm, nontender. Eyes: Conjunctivae clear without exudates or hemorrhage Neck: Supple, no carotid bruits. Pulmonary: Clear to auscultation bilaterally   NEUROLOGICAL EXAM:  MMSE - Mini Mental State Exam 05/14/2018  Orientation to time 3  Orientation to Place 5  Registration 3  Attention/ Calculation 4  Recall 2  Language- name 2 objects 2  Language- repeat 1  Language- follow 3 step command 2  Language- read & follow direction 1  Write a sentence 1  Copy design 1  Total score 25     CRANIAL NERVES: CN II: Visual fields are full to confrontation.  Pupils are round equal and briskly reactive to light. CN III, IV, VI: extraocular movement are normal. No ptosis. CN V: Facial sensation is intact to pinprick in all 3 divisions bilaterally. Corneal responses are intact.  CN VII: Face is symmetric with normal eye closure and smile. CN VIII: Hearing is normal to rubbing fingers CN IX, X: Palate elevates symmetrically. Phonation is normal. CN XI: Head turning and shoulder shrug are intact CN XII: Tongue is midline with normal movements and no atrophy.  MOTOR: He has slight right hemiparesis, fixation of right arm on rapid rotating movement, he was also noted to have mild to moderate rigidity bradykinesia, left worse than right, increased with reinforcement maneuver, also had mild nuchal rigidity  REFLEXES: Reflexes are 2+ and symmetric at the biceps, triceps, knees, and ankles. Plantar responses are flexor.  SENSORY: Intact to light touch, pinprick, positional sensation and vibratory sensation are intact in fingers and toes.  COORDINATION: Rapid alternating movements and fine finger movements are intact. There is no dysmetria on finger-to-nose and heel-knee-shin.    GAIT/STANCE: He needs push up  to get up from seated position, dragging his feet, cautious,   DIAGNOSTIC DATA (LABS, IMAGING, TESTING) - I reviewed patient records, labs, notes, testing and imaging myself where available.   ASSESSMENT AND PLAN  KEVION FATHEREE is a 85 y.o. male    Mild parkinsonian syndrome, gait abnormality   keep sinemet 25/100 mg 3 times a day, which did help his walking.  Now he is at [facility] living now.  Acute Involving left median pontine on June 30 2016  Continue to address his vascular risk factor, which include hypertension, hyperlipidemia, paroxysmal atrial fibrillation,  Continue Eliquis 5 mg twice a day   Marcial Pacas, M.D. Ph.D.  [facility] [address], [place], [place] [address] Ph: [phone] Fax: [phone]  CC: Referring Provider

## 2018-05-28 DIAGNOSIS — Z0000 Encounter for general adult medical examination without abnormal findings: Secondary | ICD-10-CM | POA: Diagnosis not present

## 2018-05-28 DIAGNOSIS — E7800 Pure hypercholesterolemia, unspecified: Secondary | ICD-10-CM | POA: Diagnosis not present

## 2018-05-28 DIAGNOSIS — G4700 Insomnia, unspecified: Secondary | ICD-10-CM | POA: Diagnosis not present

## 2018-05-28 DIAGNOSIS — J309 Allergic rhinitis, unspecified: Secondary | ICD-10-CM | POA: Diagnosis not present

## 2018-05-28 DIAGNOSIS — K219 Gastro-esophageal reflux disease without esophagitis: Secondary | ICD-10-CM | POA: Diagnosis not present

## 2018-05-28 DIAGNOSIS — G20 Parkinson's disease: Secondary | ICD-10-CM | POA: Diagnosis not present

## 2018-05-28 DIAGNOSIS — Z79899 Other long term (current) drug therapy: Secondary | ICD-10-CM | POA: Diagnosis not present

## 2018-05-28 DIAGNOSIS — Z7901 Long term (current) use of anticoagulants: Secondary | ICD-10-CM | POA: Diagnosis not present

## 2018-05-28 DIAGNOSIS — Z1389 Encounter for screening for other disorder: Secondary | ICD-10-CM | POA: Diagnosis not present

## 2018-05-28 DIAGNOSIS — I679 Cerebrovascular disease, unspecified: Secondary | ICD-10-CM | POA: Diagnosis not present

## 2018-05-28 DIAGNOSIS — R269 Unspecified abnormalities of gait and mobility: Secondary | ICD-10-CM | POA: Diagnosis not present

## 2018-05-28 DIAGNOSIS — R531 Weakness: Secondary | ICD-10-CM | POA: Diagnosis not present

## 2018-05-28 DIAGNOSIS — Z8673 Personal history of transient ischemic attack (TIA), and cerebral infarction without residual deficits: Secondary | ICD-10-CM | POA: Diagnosis not present

## 2018-05-28 DIAGNOSIS — I4891 Unspecified atrial fibrillation: Secondary | ICD-10-CM | POA: Diagnosis not present

## 2018-06-25 ENCOUNTER — Non-Acute Institutional Stay: Payer: Medicare Other | Admitting: Licensed Clinical Social Worker

## 2018-06-25 ENCOUNTER — Other Ambulatory Visit: Payer: Self-pay

## 2018-06-25 DIAGNOSIS — Z515 Encounter for palliative care: Secondary | ICD-10-CM

## 2018-06-25 NOTE — Progress Notes (Signed)
COMMUNITY PALLIATIVE CARE SW NOTE  PATIENT NAME: Jason [name] DOB: [birth year] MRN: [id number]  PRIMARY CARE PROVIDER: [name], MD  RESPONSIBLE PARTY:  Acct ID - Guarantor Home Phone Work Phone Relationship Acct Type  [id number] Jason [phone] Self P/F     [facility], [address], [place], [place] [address]     PLAN OF CARE and INTERVENTIONS:             1. GOALS OF CARE/ ADVANCE CARE PLANNING:  Patient's goal is to remain at the facility.  He is a full code. 2. SOCIAL/EMOTIONAL/SPIRITUAL ASSESSMENT/ INTERVENTIONS:  SW met with patient in his room at [facility].  He was in his lift chair watching tv.  Patient did not recall SW from previous visits.  He denied pain.  Patient engaged in conversation regarding his who died five years ago.  There were various family photos in his room that he was able to identify.  He responded positively to the social engagement since the dining room is now closed and patients must eat meals in their rooms.  Consulted facility staff who reported patient's symptoms are currently being managed. 3. PATIENT/CAREGIVER EDUCATION/ COPING:  Patient copes by expressing his feelings openly. 4. PERSONAL EMERGENCY PLAN:  Per facility protocol. 5. COMMUNITY RESOURCES COORDINATION/ HEALTH CARE NAVIGATION:  None. 6. FINANCIAL/LEGAL CONCERNS/INTERVENTIONS:  None.     SOCIAL HX:  Social History   Tobacco Use  . Smoking status: Former Smoker    Last attempt to quit: 10/29/1966    Years since quitting: 51.6  . Smokeless tobacco: Former Systems developer    Quit date: 06/29/1998  Substance Use Topics  . Alcohol use: Yes    Comment: Socially    CODE STATUS:  Full Code  ADVANCED DIRECTIVES: N MOST FORM COMPLETE:  N HOSPICE EDUCATION PROVIDED: N PPS:  Patient reports his appetite is normal.  He ambulates with a walker. Duration of visit and documentation:  45 minutes.      Jason Corn Justeen Hehr, LCSW

## 2018-07-05 ENCOUNTER — Telehealth: Payer: Self-pay | Admitting: Licensed Clinical Social Worker

## 2018-07-05 ENCOUNTER — Other Ambulatory Visit: Payer: Self-pay

## 2018-07-05 ENCOUNTER — Non-Acute Institutional Stay: Payer: Medicare Other | Admitting: Licensed Clinical Social Worker

## 2018-07-05 DIAGNOSIS — Z515 Encounter for palliative care: Secondary | ICD-10-CM

## 2018-07-05 NOTE — Telephone Encounter (Signed)
Palliative Care SW spoke with patient's daughter, [name], who agreed to a virtual check-in visit at 1:10pm today.

## 2018-07-05 NOTE — Progress Notes (Signed)
Due to the COVID-19 crisis, this virtual check-in visit was done via telephone from my office and it was initiated and consent by this family.   COMMUNITY PALLIATIVE CARE SW NOTE  PATIENT NAME: Jason [name] DOB: 1927/12/25 MRN: [id number]  PRIMARY CARE PROVIDER: [name], MD  RESPONSIBLE PARTY:  Acct ID - Guarantor Home Phone Work Phone Relationship Acct Type  [id number] Tresa [phone] Self P/F     [facility], [address], [place], [place] [address]     PLAN OF CARE and INTERVENTIONS:             1. GOALS OF CARE/ ADVANCE CARE PLANNING:  Daughter's goal is for patient to remain at the facility.  He remains a full code. 2. SOCIAL/EMOTIONAL/SPIRITUAL ASSESSMENT/ INTERVENTIONS:  SW phoned patient's daughter, Jason [name], to conduct virtual check-in-visit.  She stated patient only has a flip phone.  The facility staff is providing Skype sessions on Wednesdays for patient's and their families.  SW provided active listening and supportive counseling while she discussed her father's medical history.  She is concerned that he will not be able to have an eye injection for his Macular Degeneration.  Jason [name] feels patient is thriving at the facility. 3. PATIENT/CAREGIVER EDUCATION/ COPING:  SW provided education to patient's daughter regarding virtual check-in visits and she stated she understood. 4. PERSONAL EMERGENCY PLAN:  Per facility protocol. 5. COMMUNITY RESOURCES COORDINATION/ HEALTH CARE NAVIGATION:  None. 6. FINANCIAL/LEGAL CONCERNS/INTERVENTIONS:  None.     SOCIAL HX:  Social History   Tobacco Use  . Smoking status: Former Smoker    Last attempt to quit: 10/29/1966    Years since quitting: 51.7  . Smokeless tobacco: Former Systems developer    Quit date: 06/29/1998  Substance Use Topics  . Alcohol use: Yes    Comment: Socially    CODE STATUS:  Full Code  ADVANCED DIRECTIVES: N MOST FORM COMPLETE:  N HOSPICE EDUCATION PROVIDED:  N PPS:  Daughter reports  his appetite is normal.  He uses a walker to ambulate. Duration of visit and documentation:  30 minutes.      Creola Corn Violett Hobbs, LCSW

## 2018-09-04 ENCOUNTER — Non-Acute Institutional Stay: Payer: Medicare Other | Admitting: Licensed Clinical Social Worker

## 2018-09-04 ENCOUNTER — Other Ambulatory Visit: Payer: Self-pay

## 2018-09-04 DIAGNOSIS — Z515 Encounter for palliative care: Secondary | ICD-10-CM

## 2018-09-04 NOTE — Progress Notes (Addendum)
COMMUNITY PALLIATIVE CARE SW NOTE  PATIENT NAME: Jason [name] DOB: June 07, 1927 MRN: [id number]  PRIMARY CARE PROVIDER: [name], MD  RESPONSIBLE PARTY:  Acct ID - Guarantor Home Phone Work Phone Relationship Acct Type  [id number] Tresa [phone] Self P/F     [facility], [address], [place], [place] [address]   Due to the COVID-19 crisis, this virtual check-in visit was done via telephone from my office and it was initiated and consent by this patient and or family.  PLAN OF CARE and INTERVENTIONS:             1. GOALS OF CARE/ ADVANCE CARE PLANNING:  Goal is for patient to remain at the facility.  He is a full code. 2. SOCIAL/EMOTIONAL/SPIRITUAL ASSESSMENT/ INTERVENTIONS:  SW conducted a Sales executive visit with patient's daughter, Jason [name].  SW provided active listening and supportive counseling while Jason [name] discussed her relationship with her father.  They speak on the phone almost daily.  She feels he is adjusting to the isolation of the pandemic, but visits him through the glass door as often as possible.  She has scheduled patient to receive his eye injection on 6/23.  Patient will then have to be quarantined to his room for 14 days. 3. PATIENT/CAREGIVER EDUCATION/ COPING:  Patient's daughter expresses her feelings openly. 4. PERSONAL EMERGENCY PLAN:  Per facility protocol. 5. COMMUNITY RESOURCES COORDINATION/ HEALTH CARE NAVIGATION:  None. 6. FINANCIAL/LEGAL CONCERNS/INTERVENTIONS:  None.     SOCIAL HX:  Social History   Tobacco Use  . Smoking status: Former Smoker    Last attempt to quit: 10/29/1966    Years since quitting: 51.8  . Smokeless tobacco: Former Systems developer    Quit date: 06/29/1998  Substance Use Topics  . Alcohol use: Yes    Comment: Socially    CODE STATUS:  Full Code ADVANCED DIRECTIVES: N MOST FORM COMPLETE:  N HOSPICE EDUCATION PROVIDED: N PPS:  Daughter reported patient's appetite is normal.  Patient uses a walker to  ambulate. Duration of visit and documentation:  30 minutes.      Jason Corn Tymarion Everard, LCSW

## 2018-10-05 IMAGING — CT CT HEAD CODE STROKE
3 series · 15 of 47 positions shown, 18 images · non-contrast
Comparison: Brain MRI 06/16/2016 and earlier.

ADDENDUM:
Study discussed by telephone on 06/30/2016 with Dr. Shoukat Ali
[place], at 5832 hours.
CLINICAL DATA: Code stroke. 88-year-old male with episode of right
leg weakness slurred speech and right facial droop which lasted
about 1 hour today. Facial droop persists but other symptoms have
resolved. Initial encounter.

EXAM:
CT HEAD WITHOUT CONTRAST
TECHNIQUE: Contiguous axial images were obtained from the base of the skull
through the vertex without intravenous contrast.

[Series 3: head 5.0 h31s · axial · 0.49mm/px · z∈[+1374,+1504]mm · 9 of 32 slices shown, 12 images]
[im 3/32  brain]
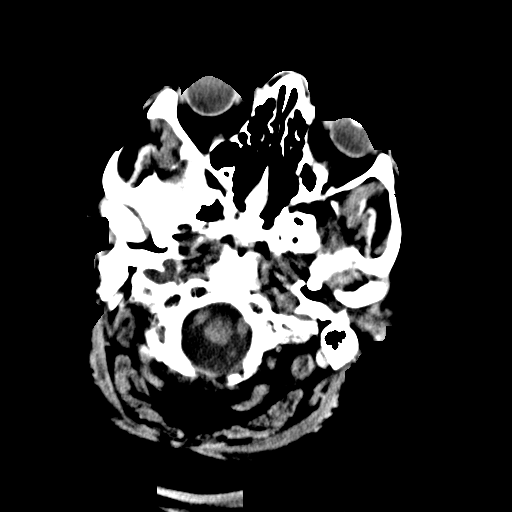
[im 3/32  bone]
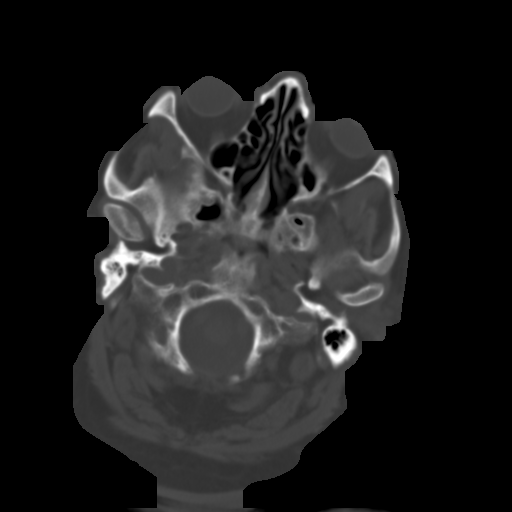
[im 6/32  brain]
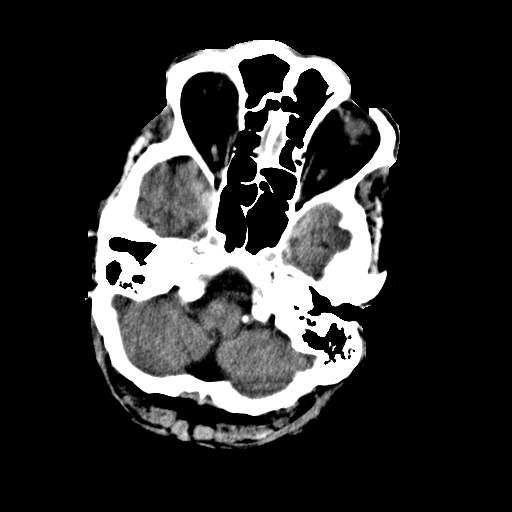
[im 9/32  brain]
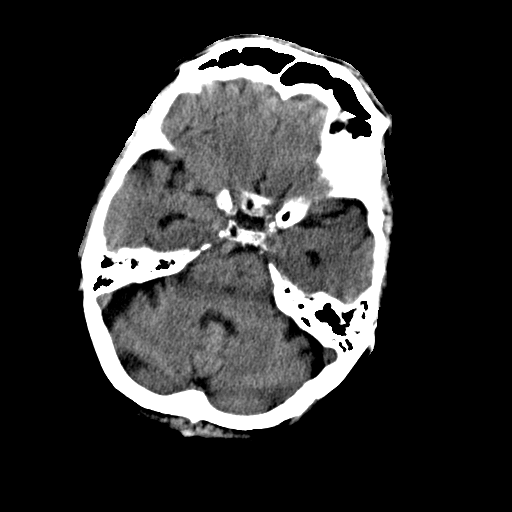
[im 12/32  brain]
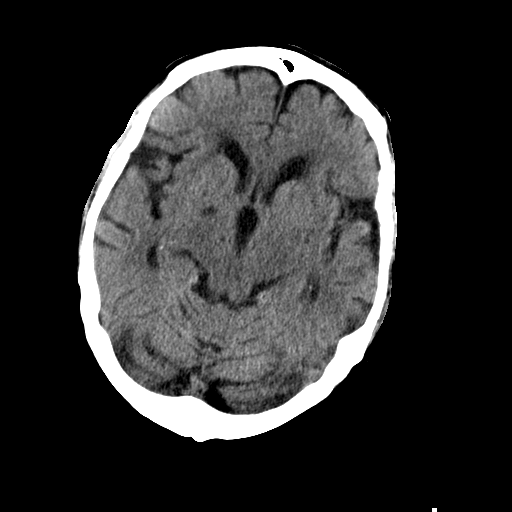
[im 17/32  brain]
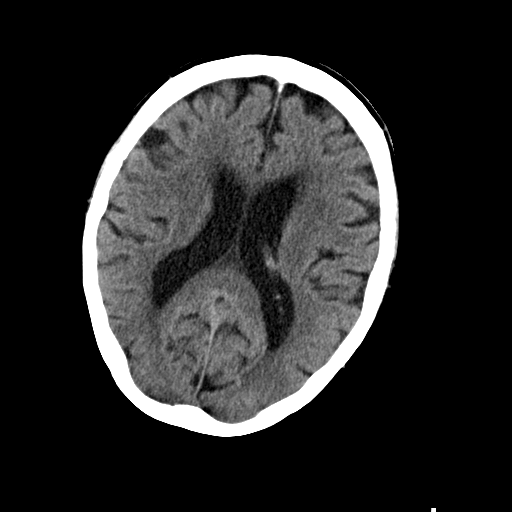
[im 17/32  bone]
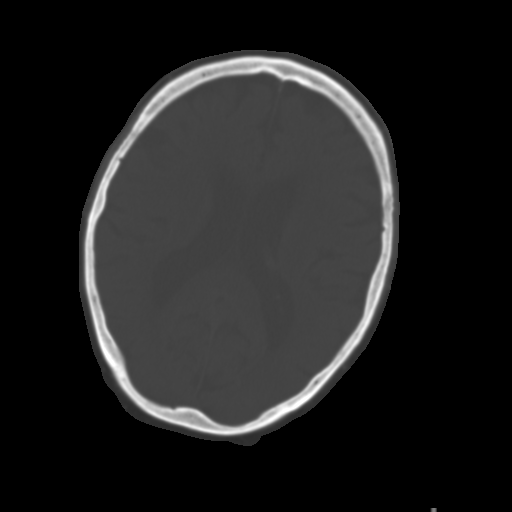
[im 20/32  brain]
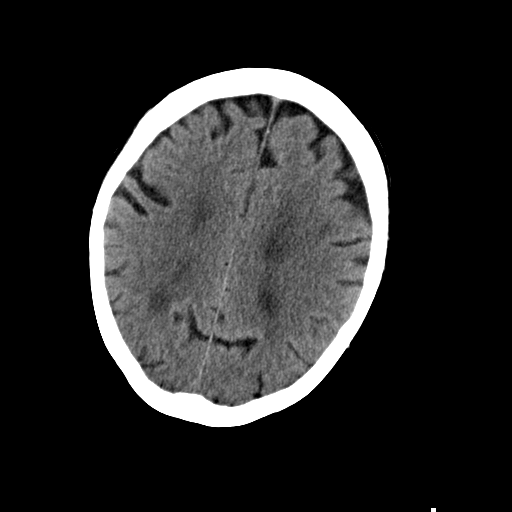
[im 23/32  brain]
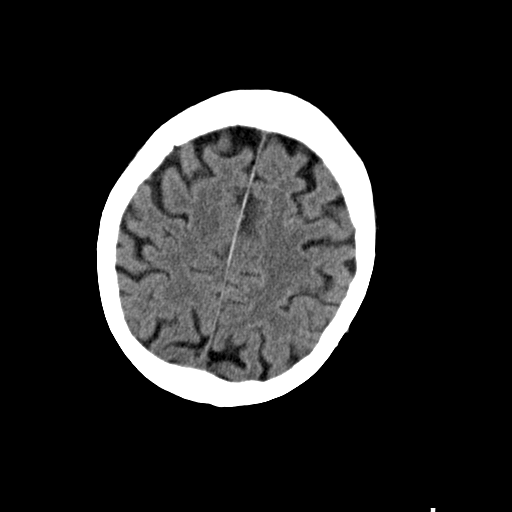
[im 26/32  brain]
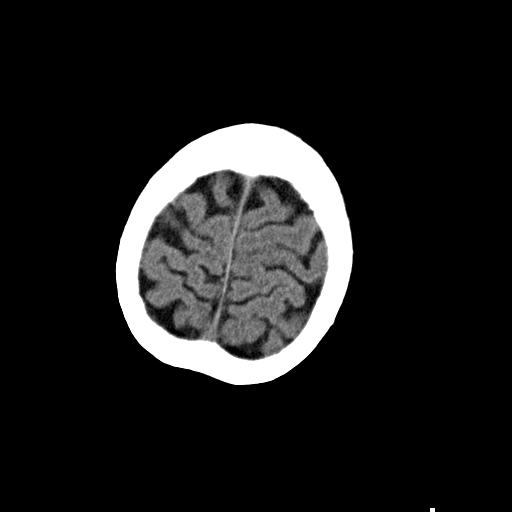
[im 29/32  brain]
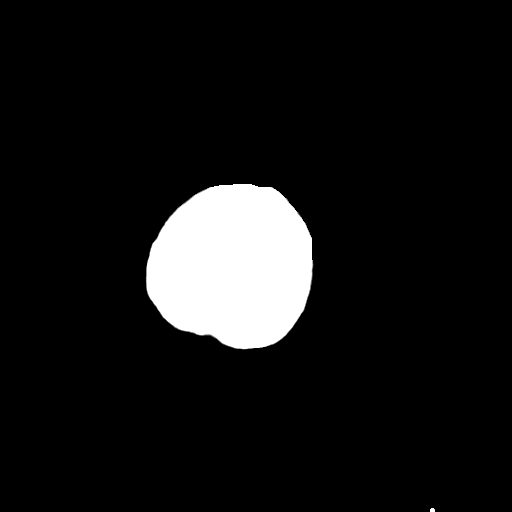
[im 29/32  bone]
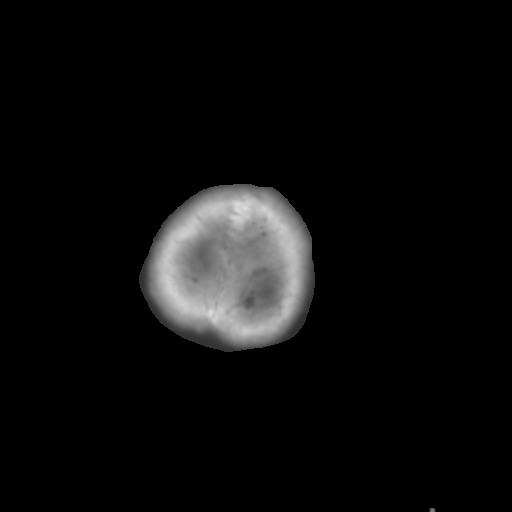

[Series 5: head 3.0 mpr cor · coronal · 0.39mm/px · 3 of 162 slices shown]
[im 72/162  brain]
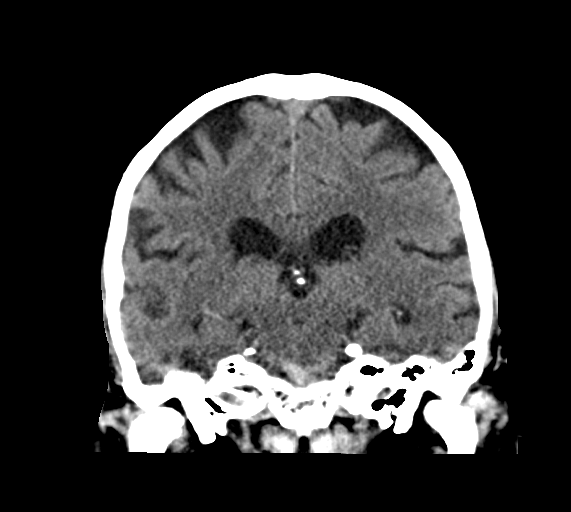
[im 90/162  brain]
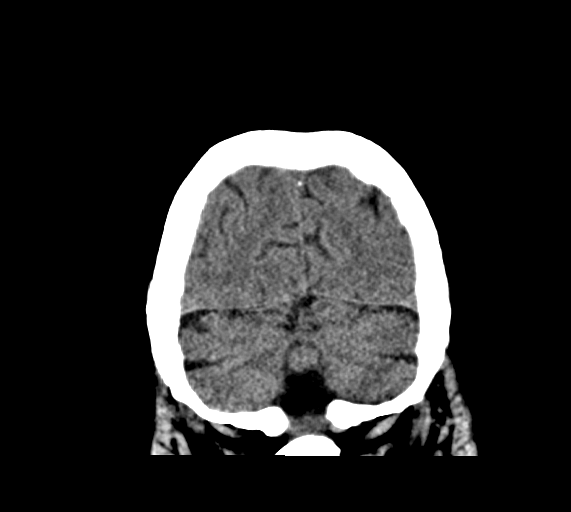
[im 108/162  brain]
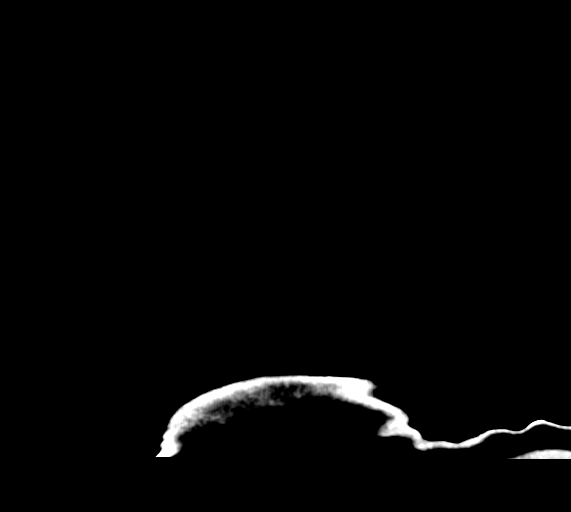

[Series 6: head 3.0 mpr sag · sagittal · 0.39mm/px · 3 of 150 slices shown]
[im 50/150  brain]
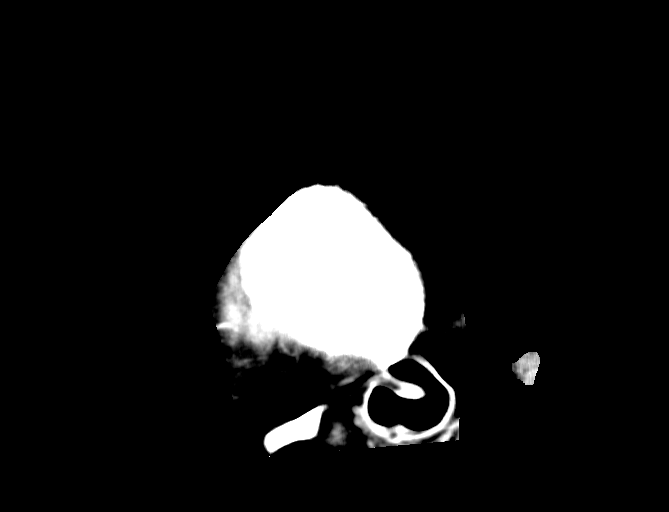
[im 75/150  brain]
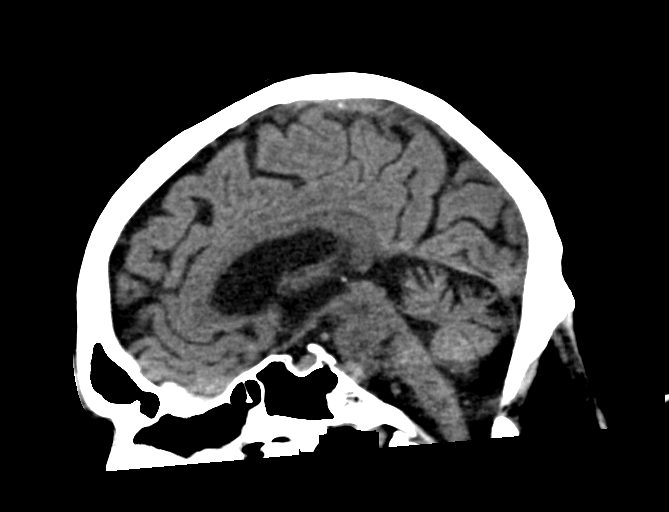
[im 100/150  brain]
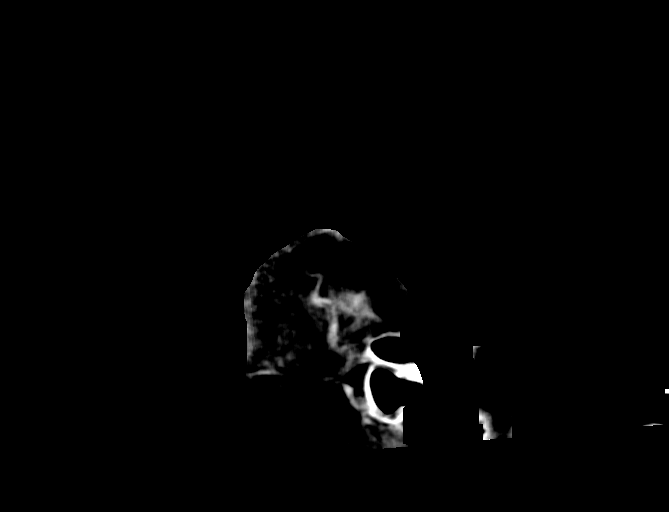

[15 of 47 positions shown; findings below may reference images not displayed]

FINDINGS: Brain: Stable cerebral volume. No midline shift, mass effect, or
evidence of intracranial mass lesion. No ventriculomegaly. No acute
intracranial hemorrhage identified.

Stable gray-white matter differentiation throughout the brain.
Chronic white matter hypodensity appears stable. No cortically based
acute infarct identified.

Vascular: Calcified atherosclerosis at the skull base. No suspicious
intracranial vascular hyperdensity.

Skull: No acute osseous abnormality identified.

Sinuses/Orbits: Visualized paranasal sinuses and mastoids are stable
and well pneumatized.

Other: No acute orbit or scalp soft tissue findings.

ASPECTS (Alberta Stroke Program Early CT Score)

- Ganglionic level infarction (caudate, lentiform nuclei, internal
capsule, insula, M1-M3 cortex): 7

- Supraganglionic infarction (M4-M6 cortex): 3

Total score (0-10 with 10 being normal): 10
IMPRESSION: 1. Stable non contrast CT appearance of the brain, no acute
intracranial abnormality.
2. ASPECTS is 10.
3. Chronic white matter changes.

## 2018-10-06 IMAGING — RF DG SWALLOWING FUNCTION - NRPT [facility]
1 series · 18 of 24 positions shown · non-contrast
Comparison: none

[Series 1: run · 37 acquisitions, 18 frames shown]
[im 1/37]
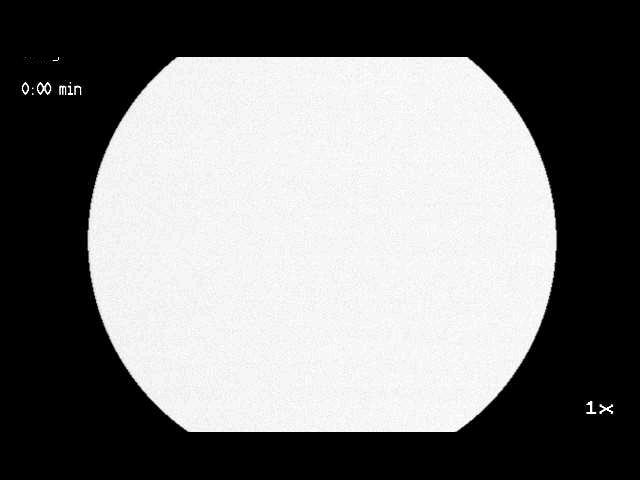
[im 4/37]
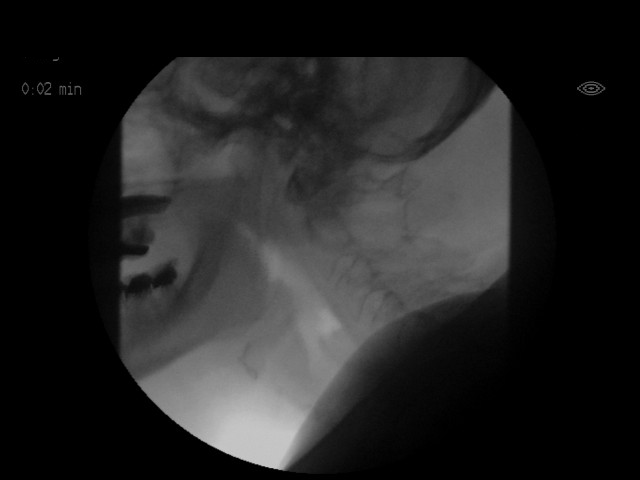
[im 5/37]
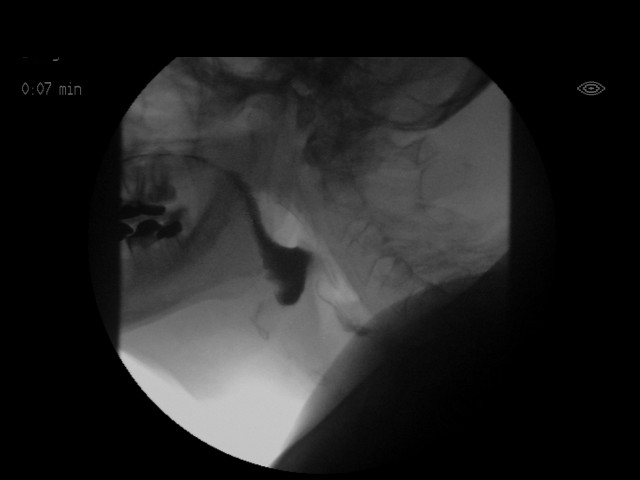
[im 7/37]
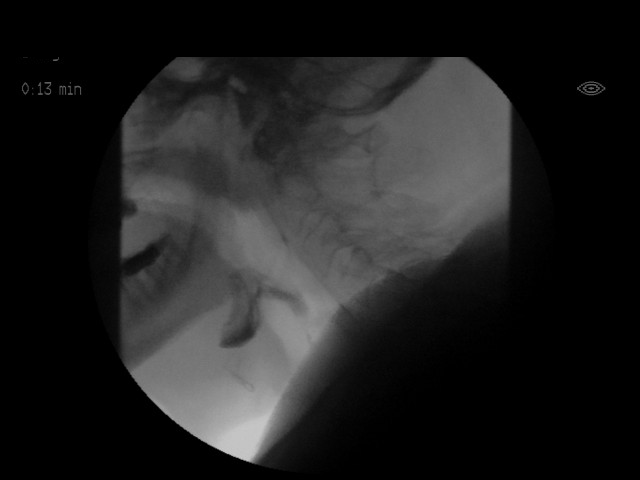
[im 10/37]
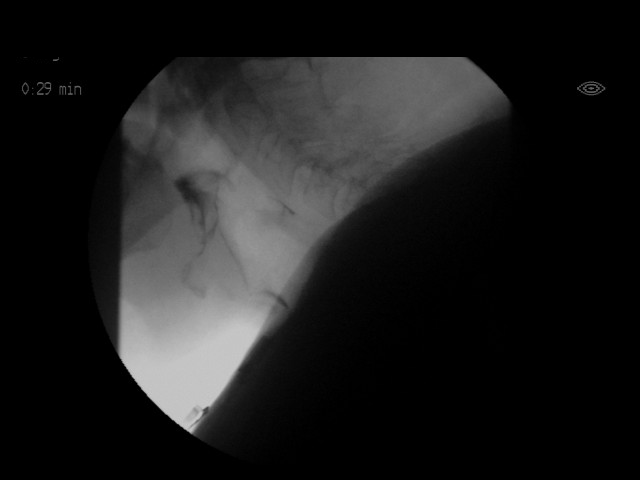
[im 11/37]
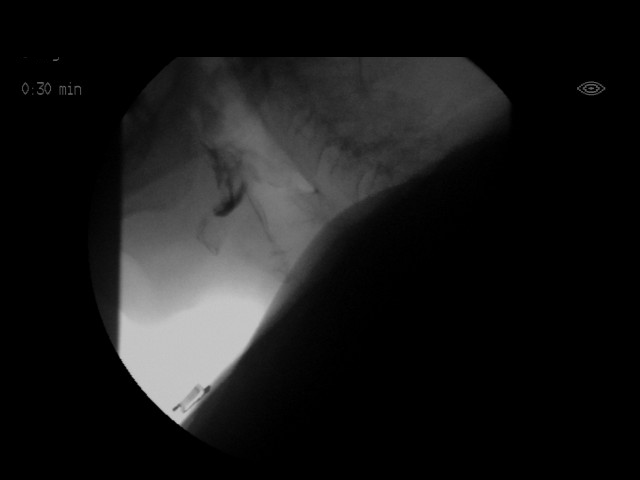
[im 13/37]
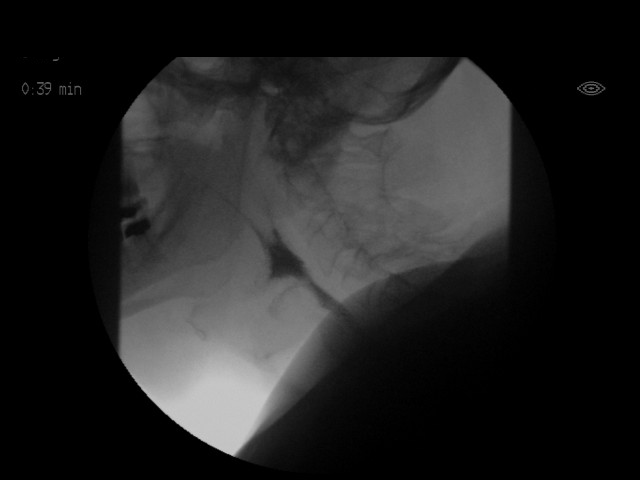
[im 16/37]
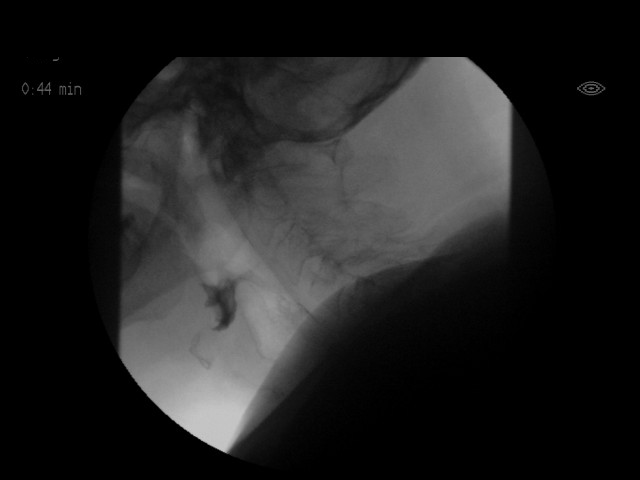
[im 18/37]
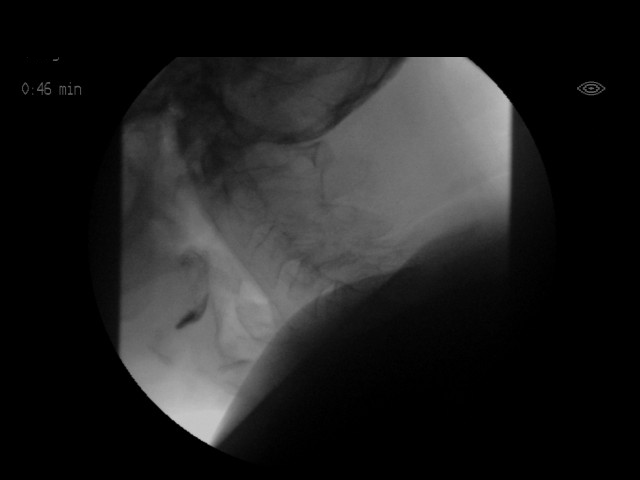
[im 19/37]
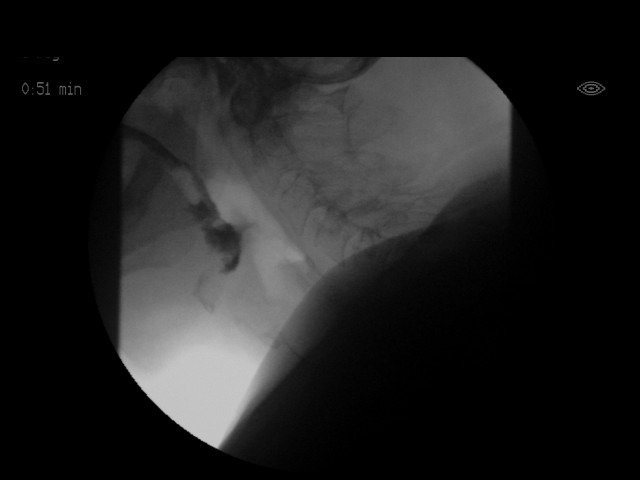
[im 22/37]
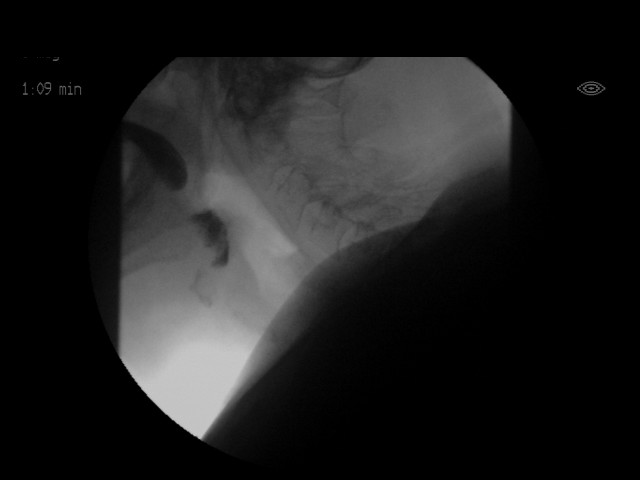
[im 24/37]
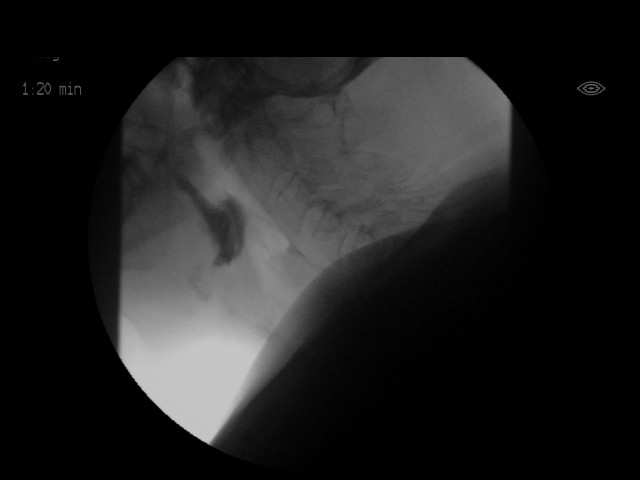
[im 26/37]
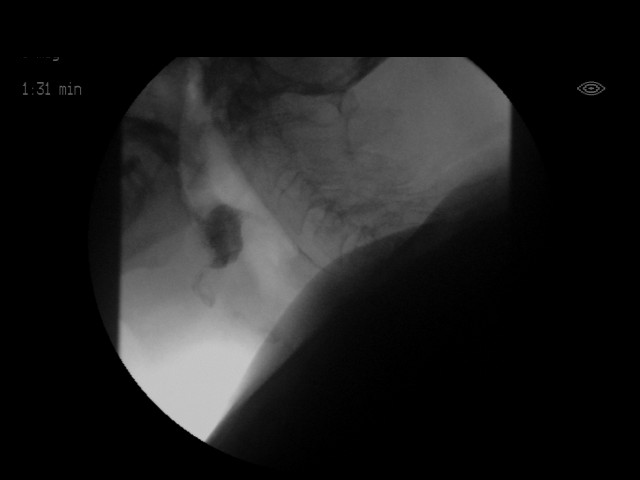
[im 29/37]
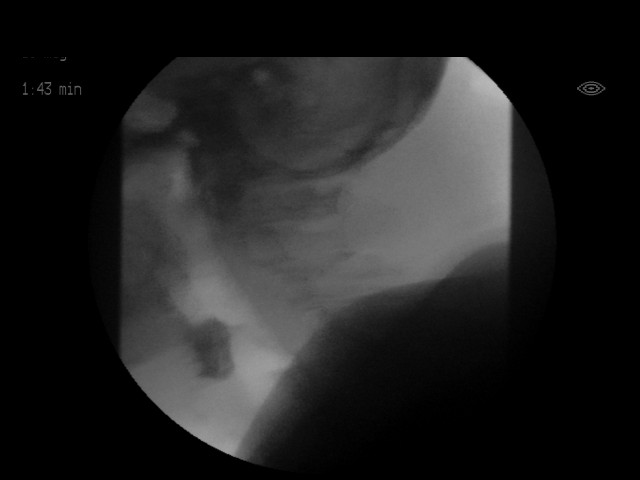
[im 30/37]
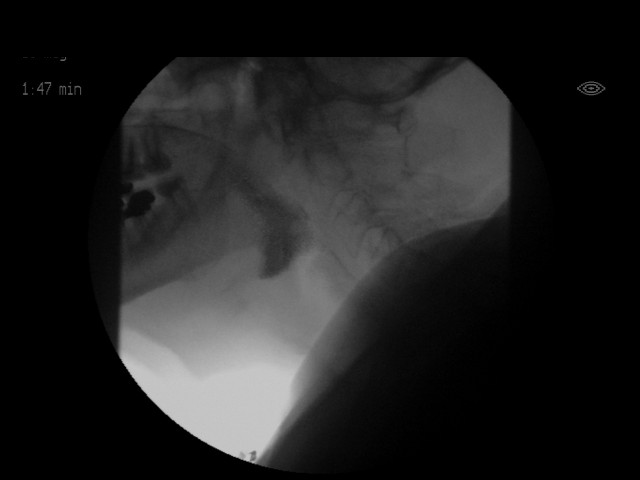
[im 32/37]
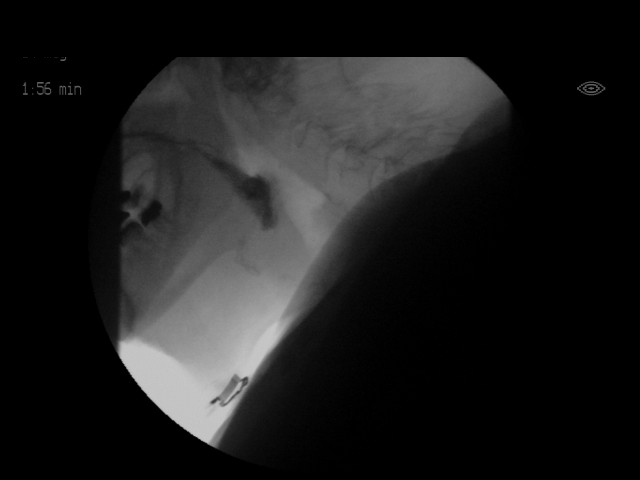
[im 35/37]
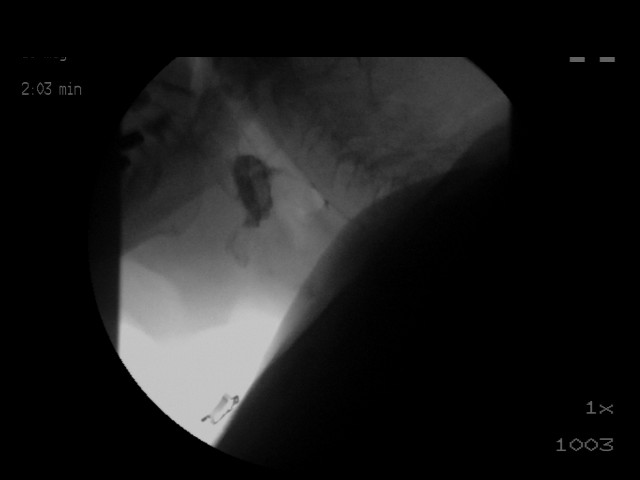
[im 37/37]
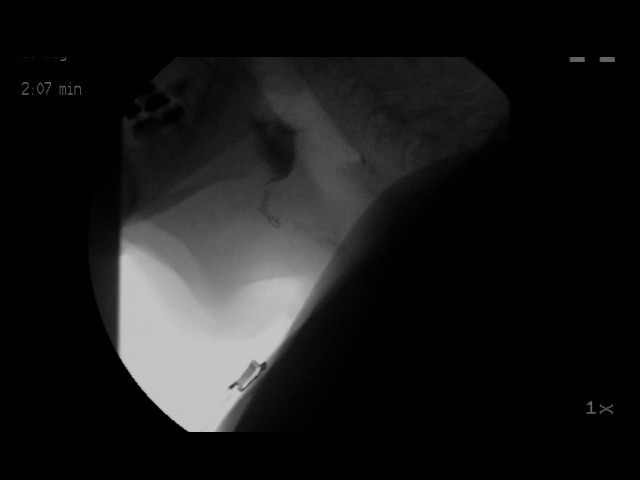

[18 of 24 positions shown; findings below may reference images not displayed]

FLUOROSCOPY FOR SWALLOWING FUNCTION STUDY:
Fluoroscopy was provided for swallowing function study, which was administered by a speech pathologist.  Final results and recommendations from this study are contained within the speech pathology report.

## 2018-10-07 IMAGING — CR DG ABD PORTABLE 1V
1 series · 1 of 1 positions shown · non-contrast
Comparison: Abdomen and pelvis CT dated 07/01/2016.

CLINICAL DATA: Feeding tube placement.

EXAM:
PORTABLE ABDOMEN - 1 VIEW

[AP]
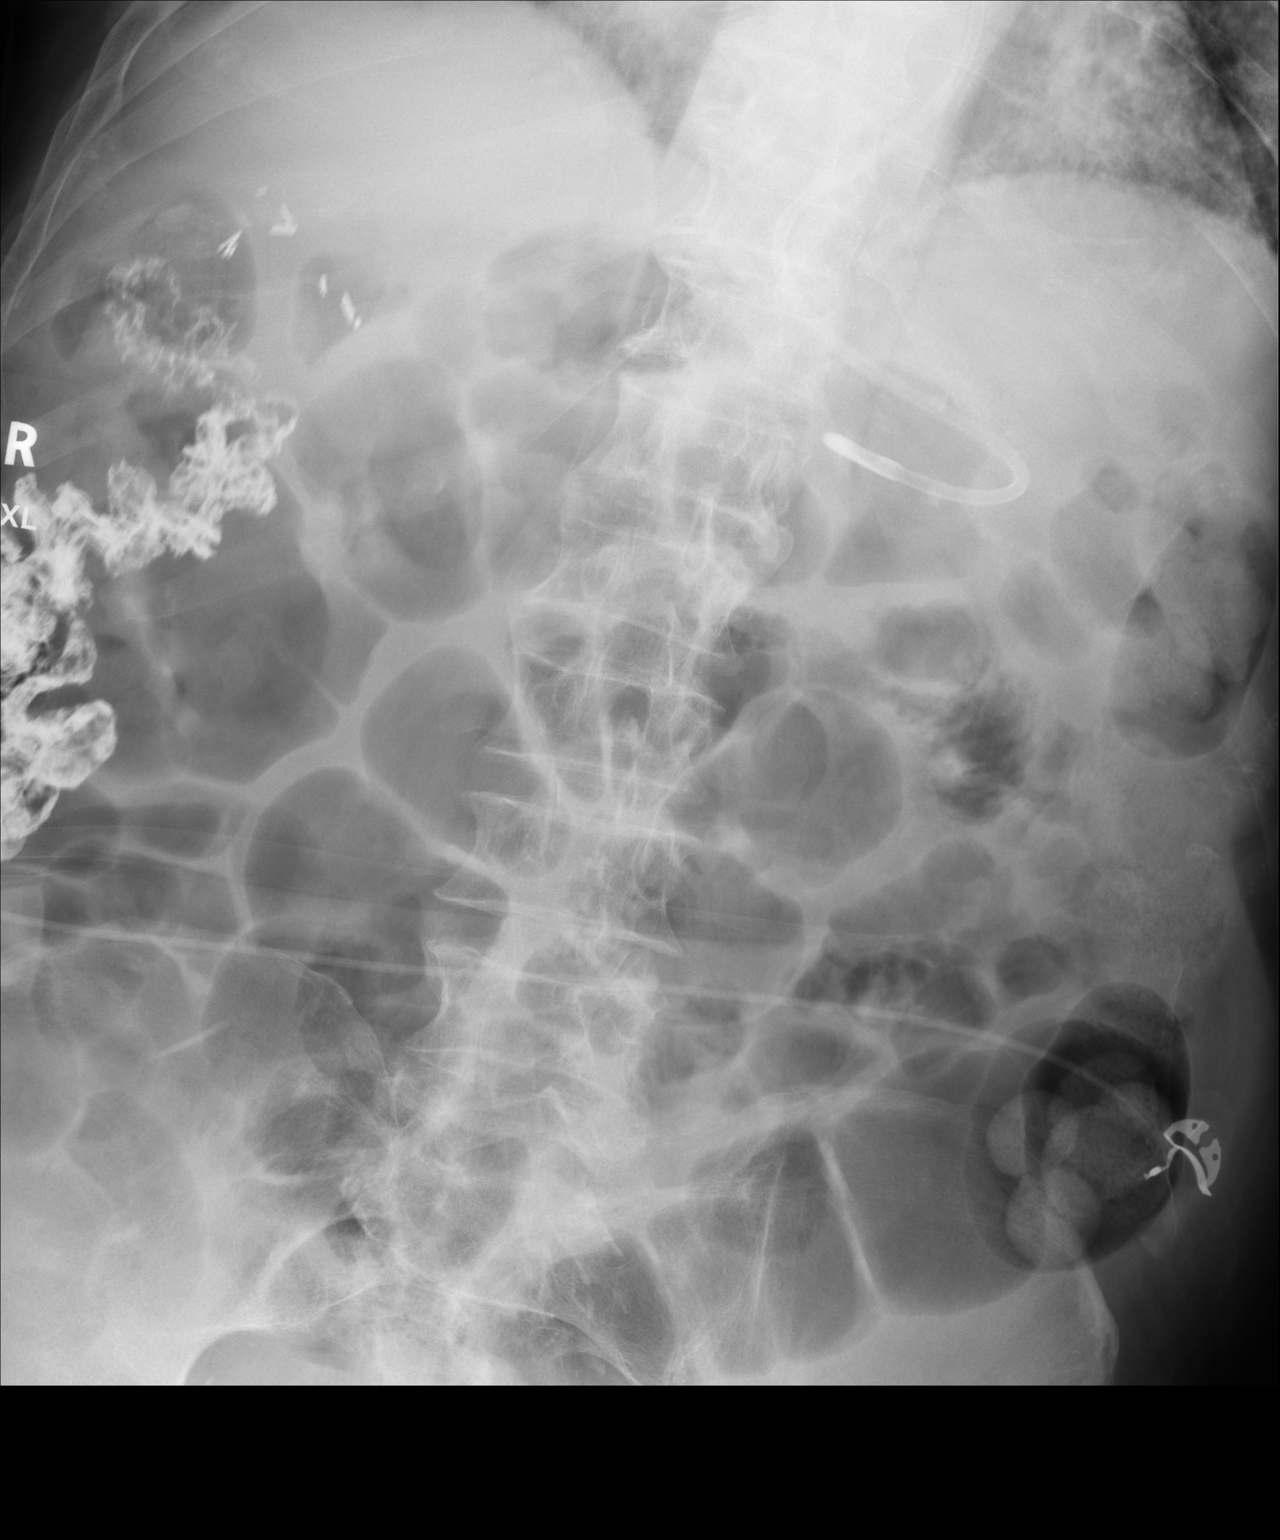

[1 of 1 positions shown; findings below may reference images not displayed]

FINDINGS: Feeding tube tip in the mid stomach. Normal bowel gas pattern. Oral
contrast in the right colon. Cholecystectomy clips. Thoracolumbar
spine degenerative changes.
IMPRESSION: Feeding tube tip in the mid stomach.

## 2018-10-10 DIAGNOSIS — H353223 Exudative age-related macular degeneration, left eye, with inactive scar: Secondary | ICD-10-CM | POA: Diagnosis not present

## 2018-10-10 DIAGNOSIS — H35371 Puckering of macula, right eye: Secondary | ICD-10-CM | POA: Diagnosis not present

## 2018-10-10 DIAGNOSIS — H43813 Vitreous degeneration, bilateral: Secondary | ICD-10-CM | POA: Diagnosis not present

## 2018-10-10 DIAGNOSIS — H353111 Nonexudative age-related macular degeneration, right eye, early dry stage: Secondary | ICD-10-CM | POA: Diagnosis not present

## 2018-10-18 ENCOUNTER — Other Ambulatory Visit: Payer: Self-pay

## 2018-10-18 ENCOUNTER — Emergency Department (HOSPITAL_COMMUNITY): Payer: Medicare Other

## 2018-10-18 ENCOUNTER — Inpatient Hospital Stay (HOSPITAL_COMMUNITY)
Admission: EM | Admit: 2018-10-18 | Discharge: 2018-10-24 | DRG: 177 | Disposition: A | Discharge status: Skilled Nursing Facility | Payer: Medicare Other | Attending: Internal Medicine | Admitting: Internal Medicine

## 2018-10-18 DIAGNOSIS — J189 Pneumonia, unspecified organism: Secondary | ICD-10-CM | POA: Diagnosis not present

## 2018-10-18 DIAGNOSIS — I5033 Acute on chronic diastolic (congestive) heart failure: Secondary | ICD-10-CM | POA: Diagnosis present

## 2018-10-18 DIAGNOSIS — J690 Pneumonitis due to inhalation of food and vomit: Principal | ICD-10-CM | POA: Diagnosis present

## 2018-10-18 DIAGNOSIS — R1310 Dysphagia, unspecified: Secondary | ICD-10-CM | POA: Diagnosis not present

## 2018-10-18 DIAGNOSIS — N400 Enlarged prostate without lower urinary tract symptoms: Secondary | ICD-10-CM | POA: Diagnosis present

## 2018-10-18 DIAGNOSIS — I130 Hypertensive heart and chronic kidney disease with heart failure and stage 1 through stage 4 chronic kidney disease, or unspecified chronic kidney disease: Secondary | ICD-10-CM | POA: Diagnosis not present

## 2018-10-18 DIAGNOSIS — Z7951 Long term (current) use of inhaled steroids: Secondary | ICD-10-CM

## 2018-10-18 DIAGNOSIS — Z7901 Long term (current) use of anticoagulants: Secondary | ICD-10-CM

## 2018-10-18 DIAGNOSIS — M1990 Unspecified osteoarthritis, unspecified site: Secondary | ICD-10-CM | POA: Diagnosis present

## 2018-10-18 DIAGNOSIS — J181 Lobar pneumonia, unspecified organism: Secondary | ICD-10-CM | POA: Diagnosis not present

## 2018-10-18 DIAGNOSIS — I69359 Hemiplegia and hemiparesis following cerebral infarction affecting unspecified side: Secondary | ICD-10-CM

## 2018-10-18 DIAGNOSIS — R197 Diarrhea, unspecified: Secondary | ICD-10-CM | POA: Diagnosis not present

## 2018-10-18 DIAGNOSIS — G20 Parkinson's disease: Secondary | ICD-10-CM | POA: Diagnosis present

## 2018-10-18 DIAGNOSIS — N179 Acute kidney failure, unspecified: Secondary | ICD-10-CM | POA: Diagnosis present

## 2018-10-18 DIAGNOSIS — R531 Weakness: Secondary | ICD-10-CM | POA: Diagnosis not present

## 2018-10-18 DIAGNOSIS — R509 Fever, unspecified: Secondary | ICD-10-CM | POA: Diagnosis not present

## 2018-10-18 DIAGNOSIS — I10 Essential (primary) hypertension: Secondary | ICD-10-CM | POA: Diagnosis not present

## 2018-10-18 DIAGNOSIS — Z87891 Personal history of nicotine dependence: Secondary | ICD-10-CM

## 2018-10-18 DIAGNOSIS — C679 Malignant neoplasm of bladder, unspecified: Secondary | ICD-10-CM

## 2018-10-18 DIAGNOSIS — C8590 Non-Hodgkin lymphoma, unspecified, unspecified site: Secondary | ICD-10-CM | POA: Diagnosis present

## 2018-10-18 DIAGNOSIS — R918 Other nonspecific abnormal finding of lung field: Secondary | ICD-10-CM | POA: Diagnosis not present

## 2018-10-18 DIAGNOSIS — E860 Dehydration: Secondary | ICD-10-CM | POA: Diagnosis not present

## 2018-10-18 DIAGNOSIS — I5032 Chronic diastolic (congestive) heart failure: Secondary | ICD-10-CM | POA: Diagnosis not present

## 2018-10-18 DIAGNOSIS — Z66 Do not resuscitate: Secondary | ICD-10-CM | POA: Diagnosis not present

## 2018-10-18 DIAGNOSIS — L89151 Pressure ulcer of sacral region, stage 1: Secondary | ICD-10-CM | POA: Diagnosis present

## 2018-10-18 DIAGNOSIS — Z9221 Personal history of antineoplastic chemotherapy: Secondary | ICD-10-CM

## 2018-10-18 DIAGNOSIS — K219 Gastro-esophageal reflux disease without esophagitis: Secondary | ICD-10-CM | POA: Diagnosis not present

## 2018-10-18 DIAGNOSIS — Z923 Personal history of irradiation: Secondary | ICD-10-CM

## 2018-10-18 DIAGNOSIS — R6510 Systemic inflammatory response syndrome (SIRS) of non-infectious origin without acute organ dysfunction: Secondary | ICD-10-CM

## 2018-10-18 DIAGNOSIS — F0280 Dementia in other diseases classified elsewhere without behavioral disturbance: Secondary | ICD-10-CM | POA: Diagnosis present

## 2018-10-18 DIAGNOSIS — N183 Chronic kidney disease, stage 3 (moderate): Secondary | ICD-10-CM

## 2018-10-18 DIAGNOSIS — Z8711 Personal history of peptic ulcer disease: Secondary | ICD-10-CM

## 2018-10-18 DIAGNOSIS — Z8249 Family history of ischemic heart disease and other diseases of the circulatory system: Secondary | ICD-10-CM

## 2018-10-18 DIAGNOSIS — E785 Hyperlipidemia, unspecified: Secondary | ICD-10-CM | POA: Diagnosis not present

## 2018-10-18 DIAGNOSIS — Z8673 Personal history of transient ischemic attack (TIA), and cerebral infarction without residual deficits: Secondary | ICD-10-CM

## 2018-10-18 DIAGNOSIS — I4891 Unspecified atrial fibrillation: Secondary | ICD-10-CM | POA: Diagnosis not present

## 2018-10-18 DIAGNOSIS — I480 Paroxysmal atrial fibrillation: Secondary | ICD-10-CM | POA: Diagnosis present

## 2018-10-18 DIAGNOSIS — S0990XA Unspecified injury of head, initial encounter: Secondary | ICD-10-CM | POA: Diagnosis not present

## 2018-10-18 DIAGNOSIS — K5730 Diverticulosis of large intestine without perforation or abscess without bleeding: Secondary | ICD-10-CM | POA: Diagnosis present

## 2018-10-18 DIAGNOSIS — I69391 Dysphagia following cerebral infarction: Secondary | ICD-10-CM

## 2018-10-18 DIAGNOSIS — W19XXXA Unspecified fall, initial encounter: Secondary | ICD-10-CM | POA: Diagnosis not present

## 2018-10-18 DIAGNOSIS — Z8551 Personal history of malignant neoplasm of bladder: Secondary | ICD-10-CM

## 2018-10-18 DIAGNOSIS — Z1159 Encounter for screening for other viral diseases: Secondary | ICD-10-CM | POA: Diagnosis not present

## 2018-10-18 DIAGNOSIS — Z85828 Personal history of other malignant neoplasm of skin: Secondary | ICD-10-CM

## 2018-10-18 DIAGNOSIS — Z96652 Presence of left artificial knee joint: Secondary | ICD-10-CM | POA: Diagnosis present

## 2018-10-18 DIAGNOSIS — Z85028 Personal history of other malignant neoplasm of stomach: Secondary | ICD-10-CM

## 2018-10-18 DIAGNOSIS — R0989 Other specified symptoms and signs involving the circulatory and respiratory systems: Secondary | ICD-10-CM | POA: Diagnosis not present

## 2018-10-18 DIAGNOSIS — R000 Tachycardia, unspecified: Secondary | ICD-10-CM | POA: Diagnosis not present

## 2018-10-18 DIAGNOSIS — Z8546 Personal history of malignant neoplasm of prostate: Secondary | ICD-10-CM

## 2018-10-18 DIAGNOSIS — A0472 Enterocolitis due to Clostridium difficile, not specified as recurrent: Secondary | ICD-10-CM | POA: Diagnosis present

## 2018-10-18 DIAGNOSIS — K449 Diaphragmatic hernia without obstruction or gangrene: Secondary | ICD-10-CM | POA: Diagnosis present

## 2018-10-18 LAB — APTT: aPTT: 40 seconds — ABNORMAL HIGH (ref 24–36)

## 2018-10-18 LAB — CBC WITH DIFFERENTIAL/PLATELET
Abs Immature Granulocytes: 0.08 10*3/uL — ABNORMAL HIGH (ref 0.00–0.07)
Basophils Absolute: 0 10*3/uL (ref 0.0–0.1)
Basophils Relative: 0 %
Eosinophils Absolute: 0.1 10*3/uL (ref 0.0–0.5)
Eosinophils Relative: 1 %
HCT: 47.3 % (ref 39.0–52.0)
Hemoglobin: 13.9 g/dL (ref 13.0–17.0)
Immature Granulocytes: 1 %
Lymphocytes Relative: 6 %
Lymphs Abs: 0.6 10*3/uL — ABNORMAL LOW (ref 0.7–4.0)
MCH: 26.6 pg (ref 26.0–34.0)
MCHC: 29.4 g/dL — ABNORMAL LOW (ref 30.0–36.0)
MCV: 90.4 fL (ref 80.0–100.0)
Monocytes Absolute: 0.9 10*3/uL (ref 0.1–1.0)
Monocytes Relative: 9 %
Neutro Abs: 8.3 10*3/uL — ABNORMAL HIGH (ref 1.7–7.7)
Neutrophils Relative %: 83 %
Platelets: 204 10*3/uL (ref 150–400)
RBC: 5.23 MIL/uL (ref 4.22–5.81)
RDW: 14.4 % (ref 11.5–15.5)
WBC: 9.9 10*3/uL (ref 4.0–10.5)
nRBC: 0 % (ref 0.0–0.2)

## 2018-10-18 LAB — URINALYSIS, ROUTINE W REFLEX MICROSCOPIC
Bilirubin Urine: NEGATIVE
Glucose, UA: NEGATIVE mg/dL
Hgb urine dipstick: NEGATIVE
Ketones, ur: NEGATIVE mg/dL
Leukocytes,Ua: NEGATIVE
Nitrite: NEGATIVE
Protein, ur: NEGATIVE mg/dL
Specific Gravity, Urine: 1.015 (ref 1.005–1.030)
pH: 5 (ref 5.0–8.0)

## 2018-10-18 LAB — LACTIC ACID, PLASMA: Lactic Acid, Venous: 1.4 mmol/L (ref 0.5–1.9)

## 2018-10-18 LAB — COMPREHENSIVE METABOLIC PANEL
ALT: 9 U/L (ref 0–44)
AST: 19 U/L (ref 15–41)
Albumin: 2.8 g/dL — ABNORMAL LOW (ref 3.5–5.0)
Alkaline Phosphatase: 42 U/L (ref 38–126)
Anion gap: 10 (ref 5–15)
BUN: 24 mg/dL — ABNORMAL HIGH (ref 8–23)
CO2: 25 mmol/L (ref 22–32)
Calcium: 8.6 mg/dL — ABNORMAL LOW (ref 8.9–10.3)
Chloride: 103 mmol/L (ref 98–111)
Creatinine, Ser: 1.87 mg/dL — ABNORMAL HIGH (ref 0.61–1.24)
GFR calc Af Amer: 36 mL/min — ABNORMAL LOW (ref >60)
GFR calc non Af Amer: 31 mL/min — ABNORMAL LOW (ref >60)
Glucose, Bld: 116 mg/dL — ABNORMAL HIGH (ref 70–99)
Potassium: 4.8 mmol/L (ref 3.5–5.1)
Sodium: 138 mmol/L (ref 135–145)
Total Bilirubin: 0.5 mg/dL (ref 0.3–1.2)
Total Protein: 7.8 g/dL (ref 6.5–8.1)

## 2018-10-18 LAB — PROTIME-INR
INR: 1.6 — ABNORMAL HIGH (ref 0.8–1.2)
Prothrombin Time: 18.6 seconds — ABNORMAL HIGH (ref 11.4–15.2)

## 2018-10-18 LAB — SARS CORONAVIRUS 2 BY RT PCR (HOSPITAL ORDER, PERFORMED IN ~~LOC~~ HOSPITAL LAB): SARS Coronavirus 2: NEGATIVE

## 2018-10-18 MED ORDER — VANCOMYCIN HCL IN DEXTROSE 1-5 GM/200ML-% IV SOLN
1000.0000 mg | Freq: Once | INTRAVENOUS | Status: AC
  Administered 2018-10-18: 1000 mg via INTRAVENOUS
  Filled 2018-10-18: qty 200

## 2018-10-18 MED ORDER — METRONIDAZOLE IN NACL 5-0.79 MG/ML-% IV SOLN
500.0000 mg | Freq: Once | INTRAVENOUS | Status: AC
  Administered 2018-10-18: 500 mg via INTRAVENOUS
  Filled 2018-10-18: qty 100

## 2018-10-18 MED ORDER — SODIUM CHLORIDE 0.9 % IV BOLUS (SEPSIS)
1000.0000 mL | Freq: Once | INTRAVENOUS | Status: AC
  Administered 2018-10-18: 1000 mL via INTRAVENOUS

## 2018-10-18 MED ORDER — SODIUM CHLORIDE 0.9 % IV SOLN
2.0000 g | Freq: Once | INTRAVENOUS | Status: AC
  Administered 2018-10-18: 2 g via INTRAVENOUS
  Filled 2018-10-18: qty 2

## 2018-10-18 MED ORDER — ACETAMINOPHEN 325 MG PO TABS
650.0000 mg | ORAL_TABLET | Freq: Once | ORAL | Status: DC
  Filled 2018-10-18: qty 2

## 2018-10-18 MED ORDER — VANCOMYCIN HCL IN DEXTROSE 1-5 GM/200ML-% IV SOLN: 1000.0000 mg | Freq: Once | INTRAVENOUS | Status: DC

## 2018-10-18 NOTE — H&P (Addendum)
Jason [name] HRC:[id number] DOB: 07/07/1927 DOA: 10/18/2018     PCP: [name], MD   Outpatient Specialists:   CARDS:   Dr.Skains   NEurology  Dr. [name]    Oncology Dr. Beryle Beams     ID   Patient arrived to ER on 10/18/18 at 1746  Patient coming from:   From facility [facility]  Chief Complaint:  Chief Complaint  Patient presents with   Fatigue   Fever    HPI: Jason [name] is a 85 y.o. male with medical history significant of chronic diastolic heart failure.   paroxysmal atrial fibrillation on Eliquis CKD 3 non-Hodgkin's lymphoma Parkinson's disease aspiration pneumonia,  history of stroke , bladder cancer GERD, prostate cancer.  Peptic ulcer disease  Presented with   Fever and diarrhea Fall today as he was rushing to get to the bathroom He was so weak that he could not get up denies any injuries from falling no headache he is not sure if he hit his head he have not had any cough or dysuria  No chest pain or shortness of breath   Infectious risk factors:  Reports fever,   Diarrhea    In  ER RAPID COVID TEST NEGATIVE     Regarding pertinent Chronic problems:    Hyperlipidemia -  on statins simvastatin 20   HTN on Metoprolol Tartrate 63m twice daily   CHF diastolic  - last echo 36/80/3212EF 55% to 624%(grade 1 diastolic dysfunction). Lasix 438mtwice daily    Parkinson's on Sinemet. contributing to his aspiration event now with some memory loss followed  By Neurology   non-Hodgkin's lymphoma, was treated with chemo and radiation therapy in 2011,    Hx of CVA - *with/out residual deficits on Aspirin 81 mg, 325, Plavix March 29th 2018, right-sided facial droop, right upper and lower extremity weakness MRI of the brain showed left paramedian pontine infarction secondary to small vessel disease, CT angiogram of head and neck showed no significant large vessel abnormality   A. Fib -  - CHA2DS2 vas score 5 :  current  on anticoagulation with  Eliquis,           -  Rate control:  Currently controlled with  Metoprolol,           CKD stage III - baseline Cr  1.5   While in ER: Febrile up to 100.2 Evidence of AKI LA 1.4 CXR: LLL opacity CT head neg  The following Work up has been ordered so far:  Orders Placed This Encounter  Procedures   Blood Culture (routine x 2)   Urine culture   SARS Coronavirus 2 ([facility]PHEID - [facility] Hosp Order   C difficile quick scan w PCR reflex   Gastrointestinal Panel by PCR , Stool   DG Chest Port 1 View   CT Head Wo Contrast   Lactic acid, plasma   Comprehensive metabolic panel   CBC WITH DIFFERENTIAL   APTT   Protime-INR   Urinalysis, Routine w reflex microscopic   Diet NPO time specified   Cardiac monitoring   Refer to Sidebar Report: Sepsis Bundle ED/IP   Document vital signs within 1-hour of fluid bolus completion and notify provider of bolus completion   Document Actual / Estimated Weight   Insert peripheral IV x 2   Initiate Carrier Fluid Protocol   Height and weight   Cardiac monitoring   Call Code Sepsis ([facility] [phone]  for Consult? tracking   Consult to hospitalist  ALL PATIENTS BEING ADMITTED/HAVING PROCEDURES NEED COVID-19 SCREENING   Enteric precautions (UV disinfection) C difficile, Norovirus   Pulse oximetry, continuous   SLP eval and treat Reason for evaluation: .Swallowing evaluation (BSE, MBS and/or diet order as indicated)   EKG 12-Lead   ED EKG 12-Lead   Place in observation (patient's expected length of stay will be less than 2 midnights)   Place in observation (patient's expected length of stay will be less than 2 midnights)     Following Medications were ordered in ER: Medications  sodium chloride 0.9 % bolus 1,000 mL (0 mLs Intravenous Stopped 10/18/18 2210)    And  sodium chloride 0.9 % bolus 1,000 mL (1,000 mLs Intravenous New Bag/Given 10/18/18 1957)    And  sodium chloride  0.9 % bolus 1,000 mL (has no administration in time range)  metroNIDAZOLE (FLAGYL) IVPB 500 mg (500 mg Intravenous New Bag/Given 10/18/18 2214)  vancomycin (VANCOCIN) IVPB 1000 mg/200 mL premix (has no administration in time range)  acetaminophen (TYLENOL) tablet 650 mg (has no administration in time range)  ceFEPIme (MAXIPIME) 2 g in sodium chloride 0.9 % 100 mL IVPB (0 g Intravenous Stopped 10/18/18 2210)        Consult Orders  (From admission, onward)         Start     Ordered   10/18/18 2216  SLP eval and treat Reason for evaluation: .Swallowing evaluation (BSE, MBS and/or diet order as indicated)  Once    Question:  Reason for evaluation  Answer:  .Swallowing evaluation (BSE, MBS and/or diet order as indicated)   10/18/18 2215   10/18/18 2045  Consult to hospitalist  ALL PATIENTS BEING ADMITTED/HAVING PROCEDURES NEED COVID-19 SCREENING  Once    Comments: ALL PATIENTS BEING ADMITTED/HAVING PROCEDURES NEED COVID-19 SCREENING  Provider:  (Not yet assigned)  Question Answer Comment  Place call to: [facility]   Reason for Consult Admit      10/18/18 2044           Significant initial  Findings: Abnormal Labs Reviewed  COMPREHENSIVE METABOLIC PANEL - Abnormal; Notable for the following components:      Result Value   Glucose, Bld 116 (*)    BUN 24 (*)    Creatinine, Ser 1.87 (*)    Calcium 8.6 (*)    Albumin 2.8 (*)    GFR calc non Af Amer 31 (*)    GFR calc Af Amer 36 (*)    All other components within normal limits  CBC WITH DIFFERENTIAL/PLATELET - Abnormal; Notable for the following components:   MCHC 29.4 (*)    Neutro Abs 8.3 (*)    Lymphs Abs 0.6 (*)    Abs Immature Granulocytes 0.08 (*)    All other components within normal limits  APTT - Abnormal; Notable for the following components:   aPTT 40 (*)    All other components within normal limits  PROTIME-INR - Abnormal; Notable for the following components:   Prothrombin Time 18.6 (*)    INR 1.6 (*)     All other components within normal limits  URINALYSIS, ROUTINE W REFLEX MICROSCOPIC - Abnormal; Notable for the following components:   APPearance HAZY (*)    Bacteria, UA RARE (*)    All other components within normal limits     Otherwise labs showing:    Recent Labs  Lab 10/18/18 1905  NA 138  K 4.8  CO2 25  GLUCOSE 116*  BUN 24*  CREATININE 1.87*  CALCIUM 8.6*    Cr    Up from baseline see below Lab Results  Component Value Date   CREATININE 1.87 (H) 10/18/2018   CREATININE 1.49 (H) 12/26/2017   CREATININE 1.43 (H) 12/25/2017    Recent Labs  Lab 10/18/18 1905  AST 19  ALT 9  ALKPHOS 42  BILITOT 0.5  PROT 7.8  ALBUMIN 2.8*   Lab Results  Component Value Date   CALCIUM 8.6 (L) 10/18/2018   PHOS 4.8 (H) 01/12/2010      WBC      Component Value Date/Time   WBC 9.9 10/18/2018 1905   ANC    Component Value Date/Time   NEUTROABS 8.3 (H) 10/18/2018 1905   NEUTROABS 4.6 01/08/2013 0919   ALC No results found for: LYMPHOABS    Plt: Lab Results  Component Value Date   PLT 204 10/18/2018     Lactic Acid, Venous    Component Value Date/Time   LATICACIDVEN 1.4 10/18/2018 1900     COVID-19 Labs  No results for input(s): DDIMER, FERRITIN, LDH, CRP in the last 72 hours.  Lab Results  Component Value Date   [place] NEGATIVE 10/18/2018      HG/HCT   stable,       Component Value Date/Time   HGB 13.9 10/18/2018 1905   HGB 14.1 01/08/2013 0919   HCT 47.3 10/18/2018 1905   HCT 41.7 01/08/2013 0919      BNP (last 3 results) Recent Labs    12/22/17 2147  BNP 443.8*    ProBNP (last 3 results) No results for input(s): PROBNP in the last 8760 hours.  DM  labs:  HbA1C: No results for input(s): HGBA1C in the last 8760 hours.     CBG (last 3)  No results for input(s): GLUCAP in the last 72 hours.     UA no evidence of UTI     Urine analysis:    Component Value Date/Time   COLORURINE YELLOW 10/18/2018 1911   APPEARANCEUR  HAZY (A) 10/18/2018 1911   LABSPEC 1.015 10/18/2018 1911   PHURINE 5.0 10/18/2018 1911   GLUCOSEU NEGATIVE 10/18/2018 1911   HGBUR NEGATIVE 10/18/2018 1911   BILIRUBINUR NEGATIVE 10/18/2018 1911   KETONESUR NEGATIVE 10/18/2018 1911   PROTEINUR NEGATIVE 10/18/2018 1911   UROBILINOGEN 1.0 07/07/2011 1942   NITRITE NEGATIVE 10/18/2018 1911   LEUKOCYTESUR NEGATIVE 10/18/2018 1911      CT HEAD   NON acute  CXR - LLL PNA   ECG:  Personally reviewed by me showing: HR : 101 Rhythm:  A.fib. W RVR,    no evidence of ischemic changes QTC 470      ED Triage Vitals  Enc Vitals Group     BP 10/18/18 1806 (!) 149/68     Pulse Rate 10/18/18 1807 (!) 104     Resp 10/18/18 1807 (!) 25     Temp 10/18/18 1807 100.1 F (37.8 C)     Temp Source 10/18/18 1807 Oral     SpO2 10/18/18 1758 97 %     Weight --      Height --      Head Circumference --      Peak Flow --      Pain Score --      Pain Loc --      Pain Edu? --      Excl. in [place]? --   CZYS(06)@  Latest  Blood pressure 120/72, pulse 71, temperature (!) 102.9 F (39.4 C), temperature source Rectal, resp. rate 19, SpO2 96 %.    Hospitalist was called for admission for LLL PNA   Review of Systems:    Pertinent positives include:  Fevers, chills, fatigue, diarrhea,   Constitutional:  No weight loss, night sweats, weight loss  HEENT:  No headaches, Difficulty swallowing,Tooth/dental problems,Sore throat,  No sneezing, itching, ear ache, nasal congestion, post nasal drip,  Cardio-vascular:  No chest pain, Orthopnea, PND, anasarca, dizziness, palpitations.no Bilateral lower extremity swelling  GI:  No heartburn, indigestion, abdominal pain, nausea, vomiting,change in bowel habits, loss of appetite, melena, blood in stool, hematemesis Resp:  no shortness of breath at rest. No dyspnea on exertion, No excess mucus, no productive cough, No non-productive cough, No coughing up of blood.No change in color of mucus.No  wheezing. Skin:  no rash or lesions. No jaundice GU:  no dysuria, change in color of urine, no urgency or frequency. No straining to urinate.  No flank pain.  Musculoskeletal:  No joint pain or no joint swelling. No decreased range of motion. No back pain.  Psych:  No change in mood or affect. No depression or anxiety. No memory loss.  Neuro: no localizing neurological complaints, no tingling, no weakness, no double vision, no gait abnormality, no slurred speech, no confusion  All systems reviewed and apart from [place] all are negative  Past Medical History:   Past Medical History:  Diagnosis Date   Arthritis    Barrett esophagus    Bladder cancer ([place]) dx'd 2334,3568   surg only   CHF (congestive heart failure) (HCC)    Colon polyps    adenomatous   Difficulty sleeping    OCCASIONALLY   Diverticulosis of colon (without mention of hemorrhage)    Frequency of urination    GERD (gastroesophageal reflux disease)    Hiatal hernia    History of bladder cancer 1981   EXCISION ONLY   History of skin cancer    BASAL CELL   Hyperlipidemia    Lymphoma, non-Hodgkin's ([place]) 2011   TX WITH CHEMO    Macular degeneration    LEFT EYE   Multiple nodules of lung 09/16/2011   PAF (paroxysmal atrial fibrillation) ([place])    Personal history of colonic polyps 07/15/2010   tubular adenoma   Prostate cancer ([place])    patient denies   PUD (peptic ulcer disease)    Stomach cancer ([place])    NHL origin   Stroke ([place])    Weakness       Past Surgical History:  Procedure Laterality Date   APPENDECTOMY     bone spur     left shoulder   CHOLECYSTECTOMY     KNEE ARTHROPLASTY Left 07/30/2015   Procedure: LEFT TOTAL KNEE ARTHROPLASTY WITH COMPUTER NAVIGATION;  Surgeon: Rod Can, MD;  Location: [facility];  Service: Orthopedics;  Laterality: Left;   LASER OF PROSTATE W/ GREEN LIGHT PVP     TENDON REPAIR     right arm   TRIGGER FINGER RELEASE     bilateral hands    WRIST SURGERY     Bilateral    Social History:  Ambulatory    walker       reports that he quit smoking about 52 years ago. He quit smokeless tobacco use about 20 years ago. He reports current alcohol use. He reports that he does not use drugs.     Family History:   Family  History  Problem Relation Age of Onset   Heart attack Father    Other Mother        "old age"    Allergies: No Known Allergies   Prior to Admission medications   Medication Sig Start Date End Date Taking? Authorizing Provider  apixaban (ELIQUIS) 5 MG TABS tablet Take 5 mg by mouth 2 (two) times daily.   Yes [provider]  calcium carbonate (TUMS - DOSED IN MG ELEMENTAL CALCIUM) 500 MG chewable tablet Chew 2 tablets by mouth every 8 (eight) hours.   Yes [provider]  carbidopa-levodopa (SINEMET IR) 25-100 MG tablet Take 1 tablet by mouth 3 (three) times daily. Take at 8am, 12noon, 5pm 05/14/18  Yes Marcial Pacas, MD  Cholecalciferol (EQL VITAMIN D3) 25 MCG (1000 UT) tablet Take 2,000 Units by mouth daily. Take 2 tabs (2000 units total) once a day   Yes [provider]  Docusate Sodium (COLACE PO) Take 100 mg by mouth at bedtime.    Yes [provider]  esomeprazole (NEXIUM) 40 MG capsule TAKE 1 CAPSULE (40 MG TOTAL) BY MOUTH 2 (TWO) TIMES DAILY BEFORE A MEAL. 03/17/16  Yes Irene Shipper, MD  ferrous sulfate 325 (65 FE) MG tablet Take 325 mg by mouth daily with breakfast.   Yes [provider]  fluticasone (FLONASE) 50 MCG/ACT nasal spray Place 2 sprays into both nostrils daily.   Yes [provider]  furosemide (LASIX) 40 MG tablet Take 40 mg by mouth daily.    Yes [provider]  magnesium oxide (MAG-OX) 400 MG tablet Take 400 mg by mouth daily.    Yes [provider]  Melatonin 3 MG TABS Take 3 mg by mouth at bedtime.   Yes [provider]  memantine (NAMENDA) 10 MG tablet Take 1 tablet (10 mg total) by mouth 2 (two)  times daily. 05/14/18  Yes Marcial Pacas, MD  metoprolol tartrate (LOPRESSOR) 25 MG tablet Take 25 mg by mouth 2 (two) times daily.   Yes [provider]  potassium chloride SA (K-DUR,KLOR-CON) 20 MEQ tablet Take 2 tablets (40 mEq total) by mouth daily. Patient taking differently: Take 20 mEq by mouth 2 (two) times daily. Take with dose of Lasix 12/27/17  Yes Domenic Polite, MD  simvastatin (ZOCOR) 20 MG tablet Take 20 mg by mouth every evening.    Yes [provider]  Specialty Vitamins Products (ICAPS LUTEIN-ZEAXANTHIN PO) Take 1 tablet by mouth 2 (two) times daily.   Yes [provider]  tamsulosin (FLOMAX) 0.4 MG CAPS capsule Take 0.4 mg by mouth at bedtime.   Yes [provider]  traMADol (ULTRAM) 50 MG tablet Take 1 tablet (50 mg total) by mouth every 8 (eight) hours as needed for moderate pain. Patient not taking: Reported on 10/18/2018 12/26/17   Domenic Polite, MD   Physical Exam: Blood pressure 120/72, pulse 71, temperature (!) 102.9 F (39.4 C), temperature source Rectal, resp. rate 19, SpO2 96 %. 1. General:  in No Acute distress   Chronically ill -appearing 2. Psychological: Alert and   Oriented 3. Head/ENT:    Dry Mucous Membranes                          Head Non traumatic, neck supple                           Poor Dentition 4. SKIN:  decreased Skin turgor,  Skin clean Dry and intact no rash 5. Heart: Regular rate and rhythm no  Murmur, no Rub or gallop 6. Lungs:   no wheezes or crackles   7. Abdomen: Soft, non-tender, Non distended   Obese bowel sounds present 8. Lower extremities: no clubbing, cyanosis, no  edema 9. Neurologically Grossly intact, moving all 4 extremities equally   10. MSK: Normal range of motion   All other LABS:     Recent Labs  Lab 10/18/18 1905  WBC 9.9  NEUTROABS 8.3*  HGB 13.9  HCT 47.3  MCV 90.4  PLT 204     Recent Labs  Lab 10/18/18 1905  NA 138  K 4.8  CL 103  CO2 25  GLUCOSE 116*  BUN 24*   CREATININE 1.87*  CALCIUM 8.6*     Recent Labs  Lab 10/18/18 1905  AST 19  ALT 9  ALKPHOS 42  BILITOT 0.5  PROT 7.8  ALBUMIN 2.8*       Cultures:    Component Value Date/Time   SDES BLOOD RIGHT HAND 12/24/2017 0531   SDES BLOOD RIGHT HAND 12/24/2017 0531   SPECREQUEST  12/24/2017 0531    BOTTLES DRAWN AEROBIC ONLY Blood Culture results may not be optimal due to an inadequate volume of blood received in culture bottles   SPECREQUEST  12/24/2017 0531    BOTTLES DRAWN AEROBIC ONLY Blood Culture results may not be optimal due to an inadequate volume of blood received in culture bottles   CULT  12/24/2017 0531    NO GROWTH 5 DAYS Performed at [facility], [place] [address] [place], [place] [address]    CULT  12/24/2017 0531    NO GROWTH 5 DAYS Performed at [facility] [address], [place], [place] [address]    REPTSTATUS 12/29/2017 FINAL 12/24/2017 0531   REPTSTATUS 12/29/2017 FINAL 12/24/2017 0531     Radiological Exams on Admission: Ct Head Wo Contrast  Result Date: 10/18/2018 CLINICAL DATA:  Fall, head trauma EXAM: CT HEAD WITHOUT CONTRAST TECHNIQUE: Contiguous axial images were obtained from the base of the skull through the vertex without intravenous contrast. COMPARISON:  06/30/2016 FINDINGS: Brain: There is atrophy and chronic small vessel disease changes. No acute intracranial abnormality. Specifically, no hemorrhage, hydrocephalus, mass lesion, acute infarction, or significant intracranial injury. Vascular: No hyperdense vessel or unexpected calcification. Skull: No acute calvarial abnormality. Sinuses/Orbits: Visualized paranasal sinuses and mastoids clear. Orbital soft tissues unremarkable. Other: None IMPRESSION: Atrophy, chronic microvascular disease. No acute intracranial abnormality. Electronically Signed   By: Rolm Baptise M.D.   On: 10/18/2018 19:27   Dg Chest Port 1 View  Result Date: 10/18/2018 CLINICAL DATA:  Fever EXAM: PORTABLE CHEST 1  VIEW COMPARISON:  04/08/2018 FINDINGS: Cardiomegaly. Vascular congestion. Diffuse interstitial prominence likely interstitial edema. Confluent airspace disease noted in the left perihilar and lower lung regions concerning for asymmetric edema or pneumonia. Suspect small left effusion. IMPRESSION: Cardiomegaly with vascular congestion and interstitial prominence, likely interstitial edema. Focal left perihilar and lower lobe lobe opacity which could reflect asymmetric edema or infection. Electronically Signed   By: Rolm Baptise M.D.   On: 10/18/2018 19:10    Chart has been reviewed    Assessment/Plan   85 y.o. male with medical history significant of chronic diastolic heart failure.   paroxysmal atrial fibrillation on Eliquis CKD 3 non-Hodgkin's lymphoma Parkinson's disease aspiration pneumonia,  history of stroke , bladder cancer GERD, prostate cancer.  Peptic ulcer disease  Admitted for PNA  Present on Admission:  SIRS (systemic inflammatory response syndrome) (HCC) -  -SIRS criteria met with tachycardia ,  fever.    With  evidence of end organ damage such as  acute renal failure, encephalopathy -Most likely source being:  Pulmonary   - Obtain serial lactic acid and procalcitonin level.  - Initiate IV antibiotics   - await results of blood and urine culture  - Rehydrate   COVID negative  Dysphasia while in emergency department patient has started to cough after drinking some water and has required suction change him to n.p.o. orders with speech pathology evaluation resume home medications only when able to tolerate Given history of Parkinson disease likely explaining dysphasia   Hyperlipidemia -  stable continue home medications    NHL (non-Hodgkin's lymphoma) (HCC)  -table in remission  GERD (gastroesophageal reflux disease) stable hold protonics given diarrhea possibility of C. difficile   BPH (benign prostatic hyperplasia) stable continue home medications   CAP (community  acquired pneumonia) cyst aspiration pneumonia patient risk of had aspiration pneumonia in the past broad-spectrum coverage for now given patient has been residing in facility and at risk for aspiration.  Continue cefepime and bank and metronidazole for now and simplify antibiotics as able  Paroxysmal atrial fibrillation (HCC) -           - CHA2DS2 vas score 5: continue current anticoagulation with Eliquis,          -  Rate control:  Currently controlled with Metoprolol for tonight given soft blood pressures resume as soon as able to        Acute renal failure superimposed on stage 3 chronic kidney disease (HCC) rehydrate and follow kidney status  Parkinson's disease ([place])  stable continue home medications   Chronic diastolic (congestive) heart failure (HCC) - - currently appears to be slightly on the dry side, hold home diuretics for tonight and restart when appears euvolemic, carefuly follow fluid status and Cr   Diarrhea -unclear etiology will check gastric panel and C. difficile  Dehydration gently rehydrate and follow   Malignant neoplasm of bladder ([place]) chronic currently nonactive Dementia unclear if at baseline has significant memory issues per notes patient have had some confusion for quite some time now.  Continue to monitor for any signs of sundowning given infection expect some degree of worsening encephalopathy hold Namenda given diarrhea Other plan as per orders. Decubitus pressure ulcer noted during exam just some redness on the sacrum with family benefit from wound consult to prevent fathers worsening this was noted on admission DVT prophylaxis:  eliquis    Code Status:  FULL CODE as per patient    I had personally discussed CODE STATUS with patient    Family Communication:   Family not at  Bedside   Disposition Plan:                              Back to current facility when stable                                                Would benefit from PT/OT eval prior  to DC  Ordered                   Swallow eval - SLP ordered  Social Work  consulted                    Consults called: none  Admission status:  ED Disposition    ED Disposition Condition [place]: [facility] [[id number]]  Level of Care: Stepdown [14]  Admit to SDU based on following criteria: Hemodynamic compromise or significant risk of instability:  Patient requiring short term acute titration and management of vasoactive drips, and invasive monitoring (i.e., CVP and Arterial line).  Covid Evaluation: Confirmed COVID Negative  Diagnosis: CAP (community acquired pneumonia) [388719]  Admitting Physician: Toy Baker [[id number]]  Attending Physician: Toy Baker [[id number]]  PT Class (Do Not Modify): Observation [104]  PT Acc Code (Do Not Modify): Observation [10022]        Obs     Level of care     Tele terribly IV fluid resuscitation patient appears to be stable heart rate is come down blood pressure has been unchanged  Precautions:   enteric,  Enteric precautions (UV disinfection)  PPE: Used by the provider:   P100  eye Goggles,  Gloves    Isola Mehlman 10/18/2018, 10:15 PM    [facility]     after 2 AM please page floor coverage PA If 7AM-7PM, please contact the day team taking care of the patient using [email or web address]

## 2018-10-18 NOTE — ED Notes (Addendum)
Attempted to call report for pt. RN received a message from Kennis Carina, RN stating that he may be eligible for another bed placement. Dr. Roel Cluck would like to reassess after fluids are given.

## 2018-10-18 NOTE — ED Notes (Signed)
Updated pt's daughter on care plan for pt.

## 2018-10-18 NOTE — ED Provider Notes (Signed)
[facility] DEPT Provider Note   CSN: [id number] Arrival date & time: 10/18/18  1746    History   Chief Complaint Chief Complaint  Patient presents with   Fatigue   Fever    HPI Jason [name] is a 85 y.o. male.     HPI Patient presents to the emergency room for evaluation of weakness and fever.  Patient states he started having symptoms last night.  He had several episodes of diarrhea.  This evening he was trying to run to the bathroom when he tripped and fell.  Patient was too weak to get up on his own.  He denies any injuries from the fall.  He denies any headache.  Unclear if he hit his head.  He denies cough or dysuria.  No known ill contacts. 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)    Personal history of colonic polyps 07/15/2010   tubular adenoma   Prostate cancer ([place])    patient denies   PUD (peptic ulcer disease)    Stomach cancer ([place])    NHL origin   Stroke [facility])    Weakness     Patient Active Problem List   Diagnosis Date Noted   Memory loss 05/14/2018   Parkinsonism ([place]) 05/14/2018   History of stroke 05/04/2018   Dilated aortic root ([place]) 05/04/2018   Palliative care by specialist    Goals of care, counseling/discussion    HCAP (healthcare-associated pneumonia) 12/22/2017     Sepsis ([place]) 12/22/2017   Chronic diastolic (congestive) heart failure ([place]) 12/22/2017   Aspiration pneumonia ([place]) 12/22/2017   Diarrhea 12/22/2017   Fall at home 10/19/2016   Gait abnormality 09/05/2016   Parkinson's disease ([place]) 09/05/2016   CVA (cerebral vascular accident) ([place]) 07/01/2016   Dysarthria    History of cancer    TIA (transient ischemic attack) 06/30/2016   Hemiplegia affecting dominant side ([place]) 06/30/2016   History of bladder cancer    Dysphagia    Hyperglycemia    Acute renal failure superimposed on stage 3 chronic kidney disease (HCC)    Acute blood loss anemia    Leukocytosis    Weakness    Community acquired pneumonia 06/16/2016   Unresponsiveness    CAP (community acquired pneumonia) 06/15/2016   Paroxysmal atrial fibrillation ([place]) 06/15/2016   Pressure injury of skin 06/15/2016   Lobar pneumonia ([place])    Acute congestive heart failure ([place])    Primary osteoarthritis of left knee 07/30/2015   BPH (benign prostatic hyperplasia) 07/16/2012   Multiple nodules of lung 09/16/2011   GERD (gastroesophageal reflux disease) 06/14/2011   Constipation, chronic 06/14/2011   NHL (non-Hodgkin's lymphoma) ([place]) 01/03/2010   Malignant neoplasm of bladder ([place]) 05/28/2007  Hyperlipidemia 05/28/2007   HIATAL HERNIA 05/28/2007   BARRETT'S ESOPHAGUS, HX OF 05/28/2007    Past Surgical History:  Procedure Laterality Date   APPENDECTOMY     bone spur     left shoulder   CHOLECYSTECTOMY     KNEE ARTHROPLASTY Left 07/30/2015   Procedure: LEFT TOTAL KNEE ARTHROPLASTY WITH COMPUTER NAVIGATION;  Surgeon: Rod Can, MD;  Location: [facility]S;  Service: Orthopedics;  Laterality: Left;   LASER OF PROSTATE W/ GREEN LIGHT PVP     TENDON REPAIR     right arm   TRIGGER FINGER RELEASE     bilateral hands   WRIST SURGERY     Bilateral        Home Medications    Prior to Admission medications   Medication Sig Start  Date End Date Taking? Authorizing Provider  apixaban (ELIQUIS) 5 MG TABS tablet Take 5 mg by mouth 2 (two) times daily.   Yes [provider]  calcium carbonate (TUMS - DOSED IN MG ELEMENTAL CALCIUM) 500 MG chewable tablet Chew 2 tablets by mouth every 8 (eight) hours.   Yes [provider]  carbidopa-levodopa (SINEMET IR) 25-100 MG tablet Take 1 tablet by mouth 3 (three) times daily. Take at 8am, 12noon, 5pm 05/14/18  Yes Marcial Pacas, MD  Cholecalciferol (EQL VITAMIN D3) 25 MCG (1000 UT) tablet Take 2,000 Units by mouth daily. Take 2 tabs (2000 units total) once a day   Yes [provider]  Docusate Sodium (COLACE PO) Take 100 mg by mouth at bedtime.    Yes [provider]  esomeprazole (NEXIUM) 40 MG capsule TAKE 1 CAPSULE (40 MG TOTAL) BY MOUTH 2 (TWO) TIMES DAILY BEFORE A MEAL. 03/17/16  Yes Irene Shipper, MD  ferrous sulfate 325 (65 FE) MG tablet Take 325 mg by mouth daily with breakfast.   Yes [provider]  fluticasone (FLONASE) 50 MCG/ACT nasal spray Place 2 sprays into both nostrils daily.   Yes [provider]  furosemide (LASIX) 40 MG tablet Take 40 mg by mouth daily.    Yes [provider]  magnesium oxide (MAG-OX) 400 MG tablet Take 400 mg by mouth daily.    Yes [provider]  Melatonin 3 MG TABS Take 3 mg by mouth at bedtime.   Yes [provider]  memantine (NAMENDA) 10 MG tablet Take 1 tablet (10 mg total) by mouth 2 (two) times daily. 05/14/18  Yes Marcial Pacas, MD  metoprolol tartrate (LOPRESSOR) 25 MG tablet Take 25 mg by mouth 2 (two) times daily.   Yes [provider]  potassium chloride SA (K-DUR,KLOR-CON) 20 MEQ tablet Take 2 tablets (40 mEq total) by mouth daily. Patient taking differently: Take 20 mEq by mouth 2 (two) times daily. Take with dose of Lasix 12/27/17  Yes Domenic Polite, MD  simvastatin (ZOCOR) 20 MG tablet Take 20 mg by mouth every evening.    Yes [provider]   Specialty Vitamins Products (ICAPS LUTEIN-ZEAXANTHIN PO) Take 1 tablet by mouth 2 (two) times daily.   Yes [provider]  tamsulosin (FLOMAX) 0.4 MG CAPS capsule Take 0.4 mg by mouth at bedtime.   Yes [provider]  traMADol (ULTRAM) 50 MG tablet Take 1 tablet (50 mg total) by mouth every 8 (eight) hours as needed for moderate pain. Patient not taking: Reported on 10/18/2018 12/26/17   Domenic Polite, MD    Family History Family History  Problem Relation Age of Onset   Heart attack  Father    Other Mother        "old age"    Social History Social History   Tobacco Use   Smoking status: Former Smoker    Quit date: 10/29/1966    Years since quitting: 52.0   Smokeless tobacco: Former Systems developer    Quit date: 06/29/1998  Substance Use Topics   Alcohol use: Yes    Comment: Socially   Drug use: No     Allergies   Patient has no known allergies.   Review of Systems Review of Systems  All other systems reviewed and are negative.    Physical Exam Updated Vital Signs BP 120/72    Pulse 71    Temp (!) 102.9 F (39.4 C) (Rectal)    Resp 19    SpO2 96%   Physical Exam Vitals signs and nursing note reviewed.  Constitutional:      Appearance: He is well-developed. He is ill-appearing.  HENT:     Head: Normocephalic and atraumatic.     Right Ear: External ear normal.     Left Ear: External ear normal.  Eyes:     General: No scleral icterus.       Right eye: No discharge.        Left eye: No discharge.     Conjunctiva/sclera: Conjunctivae normal.  Neck:     Musculoskeletal: Neck supple.     Trachea: No tracheal deviation.  Cardiovascular:     Rate and Rhythm: Tachycardia present. Rhythm irregular.  Pulmonary:     Effort: Pulmonary effort is normal. No respiratory distress.     Breath sounds: Normal breath sounds. No stridor. No wheezing or rales.  Abdominal:     General: Bowel sounds are normal. There is no distension.     Palpations: Abdomen is  soft.     Tenderness: There is no abdominal tenderness. There is no guarding or rebound.  Musculoskeletal:        General: No tenderness.  Skin:    General: Skin is warm and dry.     Findings: No rash.  Neurological:     Mental Status: He is alert.     Cranial Nerves: No cranial nerve deficit (no facial droop, extraocular movements intact, no slurred speech).     Sensory: No sensory deficit.     Motor: No abnormal muscle tone or seizure activity.     Coordination: Coordination normal.      ED Treatments / Results  Labs (all labs ordered are listed, but only abnormal results are displayed) Labs Reviewed  COMPREHENSIVE METABOLIC PANEL - Abnormal; Notable for the following components:      Result Value   Glucose, Bld 116 (*)    BUN 24 (*)    Creatinine, Ser 1.87 (*)    Calcium 8.6 (*)    Albumin 2.8 (*)    GFR calc non Af Amer 31 (*)    GFR calc Af Amer 36 (*)    All other components within normal limits  CBC WITH DIFFERENTIAL/PLATELET - Abnormal; Notable for the following components:   MCHC 29.4 (*)    Neutro Abs 8.3 (*)    Lymphs Abs 0.6 (*)    Abs Immature Granulocytes 0.08 (*)    All other components within normal limits  APTT - Abnormal; Notable for the following components:   aPTT 40 (*)    All other components within normal limits  PROTIME-INR - Abnormal; Notable for the following components:   Prothrombin Time 18.6 (*)  INR 1.6 (*)    All other components within normal limits  URINALYSIS, ROUTINE W REFLEX MICROSCOPIC - Abnormal; Notable for the following components:   APPearance HAZY (*)    Bacteria, UA RARE (*)    All other components within normal limits  CULTURE, BLOOD (ROUTINE X 2)  CULTURE, BLOOD (ROUTINE X 2)  URINE CULTURE  SARS CORONAVIRUS 2 (HOSPITAL ORDER, PERFORMED IN [facility])  LACTIC ACID, PLASMA  LACTIC ACID, PLASMA    EKG EKG Interpretation  Date/Time:  Thursday October 18 2018 18:09:18 EDT Ventricular Rate:  101 PR  Interval:    QRS Duration: 137 QT Interval:  362 QTC Calculation: 470 R Axis:   -174 Text Interpretation:  Atrial fibrillation Nonspecific intraventricular conduction delay Lateral infarct, age indeterminate Probable anteroseptal infarct, recent No significant change since last tracing Confirmed by Dorie Rank [phone]) on 10/18/2018 6:41:03 PM   Radiology Ct Head Wo Contrast  Result Date: 10/18/2018 CLINICAL DATA:  Fall, head trauma EXAM: CT HEAD WITHOUT CONTRAST TECHNIQUE: Contiguous axial images were obtained from the base of the skull through the vertex without intravenous contrast. COMPARISON:  06/30/2016 FINDINGS: Brain: There is atrophy and chronic small vessel disease changes. No acute intracranial abnormality. Specifically, no hemorrhage, hydrocephalus, mass lesion, acute infarction, or significant intracranial injury. Vascular: No hyperdense vessel or unexpected calcification. Skull: No acute calvarial abnormality. Sinuses/Orbits: Visualized paranasal sinuses and mastoids clear. Orbital soft tissues unremarkable. Other: None IMPRESSION: Atrophy, chronic microvascular disease. No acute intracranial abnormality. Electronically Signed   By: Rolm Baptise M.D.   On: 10/18/2018 19:27   Dg Chest Port 1 View  Result Date: 10/18/2018 CLINICAL DATA:  Fever EXAM: PORTABLE CHEST 1 VIEW COMPARISON:  04/08/2018 FINDINGS: Cardiomegaly. Vascular congestion. Diffuse interstitial prominence likely interstitial edema. Confluent airspace disease noted in the left perihilar and lower lung regions concerning for asymmetric edema or pneumonia. Suspect small left effusion. IMPRESSION: Cardiomegaly with vascular congestion and interstitial prominence, likely interstitial edema. Focal left perihilar and lower lobe lobe opacity which could reflect asymmetric edema or infection. Electronically Signed   By: Rolm Baptise M.D.   On: 10/18/2018 19:10    Procedures Procedures (including critical care time)  Medications  Ordered in ED Medications  sodium chloride 0.9 % bolus 1,000 mL (1,000 mLs Intravenous New Bag/Given 10/18/18 1951)    And  sodium chloride 0.9 % bolus 1,000 mL (1,000 mLs Intravenous New Bag/Given 10/18/18 1957)    And  sodium chloride 0.9 % bolus 1,000 mL (has no administration in time range)  metroNIDAZOLE (FLAGYL) IVPB 500 mg (has no administration in time range)  vancomycin (VANCOCIN) IVPB 1000 mg/200 mL premix (has no administration in time range)  acetaminophen (TYLENOL) tablet 650 mg (has no administration in time range)  ceFEPIme (MAXIPIME) 2 g in sodium chloride 0.9 % 100 mL IVPB (2 g Intravenous New Bag/Given 10/18/18 1954)     Initial Impression / Assessment and Plan / ED Course  I have reviewed the triage vital signs and the nursing notes.  Pertinent labs & imaging results that were available during my care of the patient were reviewed by me and considered in my medical decision making (see chart for details).    Patient presented to the emergency room for evaluation of fever and weakness.  Labs notable for an acute kidney injury.  Chest X suggest the possibility of pneumonia.  Lactic acid levels normal.  No signs of severe sepsis.  Blood pressure has remained stable.  Patient was started on  empiric antibiotics.  COVID test is pending.  Plan admission to the hospital for further treatment.  Final Clinical Impressions(s) / ED Diagnoses   Final diagnoses:  Fever, unspecified fever cause  Community acquired pneumonia, unspecified laterality       Dorie Rank, MD 10/18/18 2047

## 2018-10-18 NOTE — ED Triage Notes (Signed)
Pt arrives [facility] from [place] at [facility], states he was trying to hurry to the bathroom and tripped and fell

## 2018-10-18 NOTE — Progress Notes (Signed)
A consult was received from an ED physician for vancomycin and cefepime per pharmacy dosing.  The patient's profile has been reviewed for ht/wt/allergies/indication/available labs.    No recent weight in chart to calculate loading dose. Ordered ht/wt.   A one time order has been placed for cefepime 2 g IV once and vancomycin 1000 mg IV once.    Further antibiotics/pharmacy consults should be ordered by admitting physician if indicated.                       Thank you, Lenis Noon, PharmD 10/18/2018  6:53 PM

## 2018-10-18 NOTE — ED Notes (Signed)
Attempted to give pt a sip of water before giving PO meds and pt started to cough. Pt required suctioning and an assist to cough. Dr. Roel Cluck has been made aware.

## 2018-10-19 ENCOUNTER — Inpatient Hospital Stay (HOSPITAL_COMMUNITY): Payer: Medicare Other

## 2018-10-19 ENCOUNTER — Encounter (HOSPITAL_COMMUNITY): Payer: Self-pay

## 2018-10-19 ENCOUNTER — Other Ambulatory Visit: Payer: Self-pay

## 2018-10-19 DIAGNOSIS — R1310 Dysphagia, unspecified: Secondary | ICD-10-CM | POA: Diagnosis not present

## 2018-10-19 DIAGNOSIS — I69359 Hemiplegia and hemiparesis following cerebral infarction affecting unspecified side: Secondary | ICD-10-CM | POA: Diagnosis not present

## 2018-10-19 DIAGNOSIS — C8590 Non-Hodgkin lymphoma, unspecified, unspecified site: Secondary | ICD-10-CM | POA: Diagnosis present

## 2018-10-19 DIAGNOSIS — R6510 Systemic inflammatory response syndrome (SIRS) of non-infectious origin without acute organ dysfunction: Secondary | ICD-10-CM | POA: Diagnosis not present

## 2018-10-19 DIAGNOSIS — A0472 Enterocolitis due to Clostridium difficile, not specified as recurrent: Secondary | ICD-10-CM | POA: Diagnosis present

## 2018-10-19 DIAGNOSIS — Z8546 Personal history of malignant neoplasm of prostate: Secondary | ICD-10-CM | POA: Diagnosis not present

## 2018-10-19 DIAGNOSIS — I480 Paroxysmal atrial fibrillation: Secondary | ICD-10-CM | POA: Diagnosis present

## 2018-10-19 DIAGNOSIS — Z1159 Encounter for screening for other viral diseases: Secondary | ICD-10-CM | POA: Diagnosis not present

## 2018-10-19 DIAGNOSIS — K219 Gastro-esophageal reflux disease without esophagitis: Secondary | ICD-10-CM | POA: Diagnosis present

## 2018-10-19 DIAGNOSIS — J690 Pneumonitis due to inhalation of food and vomit: Secondary | ICD-10-CM | POA: Diagnosis present

## 2018-10-19 DIAGNOSIS — I5033 Acute on chronic diastolic (congestive) heart failure: Secondary | ICD-10-CM | POA: Diagnosis present

## 2018-10-19 DIAGNOSIS — K5730 Diverticulosis of large intestine without perforation or abscess without bleeding: Secondary | ICD-10-CM | POA: Diagnosis present

## 2018-10-19 DIAGNOSIS — Z96652 Presence of left artificial knee joint: Secondary | ICD-10-CM | POA: Diagnosis present

## 2018-10-19 DIAGNOSIS — I130 Hypertensive heart and chronic kidney disease with heart failure and stage 1 through stage 4 chronic kidney disease, or unspecified chronic kidney disease: Secondary | ICD-10-CM | POA: Diagnosis present

## 2018-10-19 DIAGNOSIS — Z8551 Personal history of malignant neoplasm of bladder: Secondary | ICD-10-CM | POA: Diagnosis not present

## 2018-10-19 DIAGNOSIS — N400 Enlarged prostate without lower urinary tract symptoms: Secondary | ICD-10-CM | POA: Diagnosis present

## 2018-10-19 DIAGNOSIS — F0280 Dementia in other diseases classified elsewhere without behavioral disturbance: Secondary | ICD-10-CM | POA: Diagnosis present

## 2018-10-19 DIAGNOSIS — M1990 Unspecified osteoarthritis, unspecified site: Secondary | ICD-10-CM | POA: Diagnosis present

## 2018-10-19 DIAGNOSIS — R509 Fever, unspecified: Secondary | ICD-10-CM | POA: Diagnosis not present

## 2018-10-19 DIAGNOSIS — N183 Chronic kidney disease, stage 3 (moderate): Secondary | ICD-10-CM | POA: Diagnosis not present

## 2018-10-19 DIAGNOSIS — N179 Acute kidney failure, unspecified: Secondary | ICD-10-CM | POA: Diagnosis not present

## 2018-10-19 DIAGNOSIS — I5032 Chronic diastolic (congestive) heart failure: Secondary | ICD-10-CM | POA: Diagnosis not present

## 2018-10-19 DIAGNOSIS — J90 Pleural effusion, not elsewhere classified: Secondary | ICD-10-CM | POA: Diagnosis not present

## 2018-10-19 DIAGNOSIS — Z7401 Bed confinement status: Secondary | ICD-10-CM | POA: Diagnosis not present

## 2018-10-19 DIAGNOSIS — Z85828 Personal history of other malignant neoplasm of skin: Secondary | ICD-10-CM | POA: Diagnosis not present

## 2018-10-19 DIAGNOSIS — Z85028 Personal history of other malignant neoplasm of stomach: Secondary | ICD-10-CM | POA: Diagnosis not present

## 2018-10-19 DIAGNOSIS — J811 Chronic pulmonary edema: Secondary | ICD-10-CM | POA: Diagnosis not present

## 2018-10-19 DIAGNOSIS — J189 Pneumonia, unspecified organism: Secondary | ICD-10-CM | POA: Diagnosis not present

## 2018-10-19 DIAGNOSIS — K449 Diaphragmatic hernia without obstruction or gangrene: Secondary | ICD-10-CM | POA: Diagnosis present

## 2018-10-19 DIAGNOSIS — E785 Hyperlipidemia, unspecified: Secondary | ICD-10-CM | POA: Diagnosis present

## 2018-10-19 DIAGNOSIS — G20 Parkinson's disease: Secondary | ICD-10-CM | POA: Diagnosis present

## 2018-10-19 DIAGNOSIS — M2550 Pain in unspecified joint: Secondary | ICD-10-CM | POA: Diagnosis not present

## 2018-10-19 DIAGNOSIS — L89151 Pressure ulcer of sacral region, stage 1: Secondary | ICD-10-CM | POA: Diagnosis not present

## 2018-10-19 DIAGNOSIS — Z515 Encounter for palliative care: Secondary | ICD-10-CM | POA: Diagnosis not present

## 2018-10-19 DIAGNOSIS — Z209 Contact with and (suspected) exposure to unspecified communicable disease: Secondary | ICD-10-CM | POA: Diagnosis not present

## 2018-10-19 DIAGNOSIS — Z7901 Long term (current) use of anticoagulants: Secondary | ICD-10-CM | POA: Diagnosis not present

## 2018-10-19 DIAGNOSIS — Z7189 Other specified counseling: Secondary | ICD-10-CM | POA: Diagnosis not present

## 2018-10-19 DIAGNOSIS — Z66 Do not resuscitate: Secondary | ICD-10-CM | POA: Diagnosis not present

## 2018-10-19 LAB — COMPREHENSIVE METABOLIC PANEL
ALT: 9 U/L (ref 0–44)
AST: 17 U/L (ref 15–41)
Albumin: 2.3 g/dL — ABNORMAL LOW (ref 3.5–5.0)
Alkaline Phosphatase: 32 U/L — ABNORMAL LOW (ref 38–126)
Anion gap: 8 (ref 5–15)
BUN: 23 mg/dL (ref 8–23)
CO2: 21 mmol/L — ABNORMAL LOW (ref 22–32)
Calcium: 8 mg/dL — ABNORMAL LOW (ref 8.9–10.3)
Chloride: 109 mmol/L (ref 98–111)
Creatinine, Ser: 1.53 mg/dL — ABNORMAL HIGH (ref 0.61–1.24)
GFR calc Af Amer: 46 mL/min — ABNORMAL LOW (ref >60)
GFR calc non Af Amer: 40 mL/min — ABNORMAL LOW (ref >60)
Glucose, Bld: 94 mg/dL (ref 70–99)
Potassium: 4.3 mmol/L (ref 3.5–5.1)
Sodium: 138 mmol/L (ref 135–145)
Total Bilirubin: 0.5 mg/dL (ref 0.3–1.2)
Total Protein: 6.4 g/dL — ABNORMAL LOW (ref 6.5–8.1)

## 2018-10-19 LAB — CBC
HCT: 41 % (ref 39.0–52.0)
Hemoglobin: 12.5 g/dL — ABNORMAL LOW (ref 13.0–17.0)
MCH: 27.4 pg (ref 26.0–34.0)
MCHC: 30.5 g/dL (ref 30.0–36.0)
MCV: 89.9 fL (ref 80.0–100.0)
Platelets: 152 10*3/uL (ref 150–400)
RBC: 4.56 MIL/uL (ref 4.22–5.81)
RDW: 14.6 % (ref 11.5–15.5)
WBC: 7.1 10*3/uL (ref 4.0–10.5)
nRBC: 0 % (ref 0.0–0.2)

## 2018-10-19 LAB — LACTIC ACID, PLASMA
Lactic Acid, Venous: 1.4 mmol/L (ref 0.5–1.9)
Lactic Acid, Venous: 1 mmol/L (ref 0.5–1.9)
Lactic Acid, Venous: 0.9 mmol/L (ref 0.5–1.9)

## 2018-10-19 LAB — PROCALCITONIN: Procalcitonin: <0.1 ng/mL

## 2018-10-19 LAB — TSH: TSH: 2.038 u[IU]/mL (ref 0.350–4.500)

## 2018-10-19 LAB — MRSA PCR SCREENING: MRSA by PCR: POSITIVE — AB

## 2018-10-19 LAB — MAGNESIUM: Magnesium: 1.8 mg/dL (ref 1.7–2.4)

## 2018-10-19 LAB — PHOSPHORUS: Phosphorus: 3.1 mg/dL (ref 2.5–4.6)

## 2018-10-19 MED ORDER — HYDROCODONE-ACETAMINOPHEN 5-325 MG PO TABS
1.0000 | ORAL_TABLET | ORAL | Status: DC | PRN
  Administered 2018-10-21: 2 via ORAL
  Filled 2018-10-19: qty 2

## 2018-10-19 MED ORDER — VANCOMYCIN HCL IN DEXTROSE 1-5 GM/200ML-% IV SOLN
1000.0000 mg | Freq: Once | INTRAVENOUS | Status: AC
  Administered 2018-10-19: 1000 mg via INTRAVENOUS
  Filled 2018-10-19: qty 200

## 2018-10-19 MED ORDER — SIMVASTATIN 20 MG PO TABS
20.0000 mg | ORAL_TABLET | Freq: Every evening | ORAL | Status: DC
  Administered 2018-10-19 – 2018-10-23 (×5): 20 mg via ORAL
  Filled 2018-10-19 (×5): qty 1

## 2018-10-19 MED ORDER — SODIUM CHLORIDE 0.9 % IV SOLN
2.0000 g | Freq: Two times a day (BID) | INTRAVENOUS | Status: DC
  Administered 2018-10-19 – 2018-10-20 (×4): 2 g via INTRAVENOUS
  Filled 2018-10-19 (×5): qty 2

## 2018-10-19 MED ORDER — PANTOPRAZOLE SODIUM 40 MG PO TBEC
40.0000 mg | DELAYED_RELEASE_TABLET | Freq: Every day | ORAL | Status: DC
  Administered 2018-10-20 – 2018-10-24 (×5): 40 mg via ORAL
  Filled 2018-10-19 (×5): qty 1

## 2018-10-19 MED ORDER — SODIUM CHLORIDE 0.9 % IV SOLN
INTRAVENOUS | Status: AC
  Administered 2018-10-19: 02:00:00 via INTRAVENOUS

## 2018-10-19 MED ORDER — TAMSULOSIN HCL 0.4 MG PO CAPS
0.4000 mg | ORAL_CAPSULE | Freq: Every day | ORAL | Status: DC
  Administered 2018-10-19 – 2018-10-23 (×5): 0.4 mg via ORAL
  Filled 2018-10-19 (×5): qty 1

## 2018-10-19 MED ORDER — VANCOMYCIN HCL IN DEXTROSE 1-5 GM/200ML-% IV SOLN
1000.0000 mg | INTRAVENOUS | Status: DC
  Administered 2018-10-20: 1000 mg via INTRAVENOUS
  Filled 2018-10-19: qty 200

## 2018-10-19 MED ORDER — ENOXAPARIN SODIUM 100 MG/ML ~~LOC~~ SOLN
1.0000 mg/kg | Freq: Once | SUBCUTANEOUS | Status: AC
  Administered 2018-10-19: 85 mg via SUBCUTANEOUS
  Filled 2018-10-19: qty 1

## 2018-10-19 MED ORDER — VANCOMYCIN HCL 10 G IV SOLR: 1750.0000 mg | INTRAVENOUS | Status: DC

## 2018-10-19 MED ORDER — CARBIDOPA-LEVODOPA 25-100 MG PO TABS
1.0000 | ORAL_TABLET | ORAL | Status: DC
  Administered 2018-10-19 – 2018-10-24 (×15): 1 via ORAL
  Filled 2018-10-19 (×15): qty 1

## 2018-10-19 MED ORDER — ONDANSETRON HCL 4 MG PO TABS: 4.0000 mg | ORAL_TABLET | Freq: Four times a day (QID) | ORAL | Status: DC | PRN

## 2018-10-19 MED ORDER — ONDANSETRON HCL 4 MG/2ML IJ SOLN: 4.0000 mg | Freq: Four times a day (QID) | INTRAMUSCULAR | Status: DC | PRN

## 2018-10-19 MED ORDER — ENOXAPARIN SODIUM 100 MG/ML ~~LOC~~ SOLN: 1.0000 mg/kg | Freq: Two times a day (BID) | SUBCUTANEOUS | Status: DC

## 2018-10-19 MED ORDER — APIXABAN 5 MG PO TABS: 5.0000 mg | ORAL_TABLET | Freq: Two times a day (BID) | ORAL | Status: DC

## 2018-10-19 MED ORDER — METRONIDAZOLE IN NACL 5-0.79 MG/ML-% IV SOLN
500.0000 mg | Freq: Three times a day (TID) | INTRAVENOUS | Status: DC
  Administered 2018-10-19 – 2018-10-20 (×5): 500 mg via INTRAVENOUS
  Filled 2018-10-19 (×5): qty 100

## 2018-10-19 MED ORDER — ACETAMINOPHEN 325 MG PO TABS: 650.0000 mg | ORAL_TABLET | Freq: Four times a day (QID) | ORAL | Status: DC | PRN

## 2018-10-19 MED ORDER — ACETAMINOPHEN 650 MG RE SUPP: 650.0000 mg | Freq: Four times a day (QID) | RECTAL | Status: DC | PRN

## 2018-10-19 MED ORDER — ENOXAPARIN SODIUM 100 MG/ML ~~LOC~~ SOLN
1.0000 mg/kg | Freq: Two times a day (BID) | SUBCUTANEOUS | Status: DC
  Administered 2018-10-19 – 2018-10-23 (×8): 85 mg via SUBCUTANEOUS
  Filled 2018-10-19 (×8): qty 1

## 2018-10-19 NOTE — Progress Notes (Signed)
Modified Barium Swallow Progress Note  Patient Details  Name: Jason [name] MRN: [id number] Date of Birth: [birth year]  Today's Date: 10/19/2018  Modified Barium Swallow completed.  Full report located under Chart Review in the Imaging Section.  Brief recommendations include the following:  Clinical Impression  Patient presents with significantly worsening dysphagia compared to during his last MBS in 02/2018 resulting in gross residuals and aspiration/penetration.  Secretions also noted that mix with oropharyngeal barium residuals.  Oropharyngeal dysphagia due to his Parkinson's and pontine stroke likely exacerbated by his acute illness.  Oral transiting discoordinated with some lingual pumping and premature spillage/oral residuals.  Pharyngeal swallow initiation was challenging with pt allowing laryngeal penetration for 2 seconds with nectar before pharyngeal swallow even triggered, thus allowing penetration and aspiration.  With mild aspiration of nectar *silent* - cued cough removed aspirates into pharynx which pt expelled into oral cavity per SLP verbal cue before expectorating.  Penetration and aspiration occurred before, during and after the swallow.  Pt required oral suctioning frequently during MBS due to expectoration of pharyngeal retention.   Motor planning of cued swallows were difficult for pt to perform with initial swallow of puree as pt allowed bolus to be retained in pharynx/vallecular region, thus no pudding was transited into esophagus initially.  Secondary cued dry swallows to clear liquid pharyngeal retention also ineffective due to nearly absent/poor pharyngeal motility.   Gross residuals present even when swallow timing was adequate due to decreased pharyngeal and laryngeal motility.  With aspiration, pt does not initially sense and when sensation is delayed due to larger aspiration amount, bolus is too deep in trachea for him to clear.  Various postures including chin tuck and  head turn were not effective to decrease residuals or prevent aspiration/penetration with solids or liquids via cup/straw.    Decreasing amount to TEASPOOON only of thin and nectar using chin tuck posture were the only boluses pt did not aspirate nor present with gross residuals.  Pt will be functionally unable to receive adequate hydration/nutrition on liquid via tsp diet.    Pt reports he would cough/expectorate even prior to admission -strength of cough/expectoration with ability to mobilize has likely allowed aspiration tolerance.     However given acute on chronic dysphagia, worsening dysphagia, deconditioning, current pna (last one 12/2017) and full code status - SLP highly recommends a palliative referral to establish his goals of care.    Pending GOC, recommend tsps ONLY of nectar, thin with chin tuck posture and multiple swallows.     Will follow up for dysphagia management, education, possibly RMST and will review importance of oral care given pt with copious secretions likely due to his Parkinson's.  Educated pt to findings/recommendations and concerns.  He reported understanding.   Swallow Evaluation Recommendations       SLP Diet Recommendations: (only tsps nectar and thin with chin tuck posture and multiple swallows, 'hock" and expectorate as needed)           Supervision: Full assist for feeding   Compensations: Slow rate;Small sips/bites;Chin tuck(cough and reswallow or expectorate if reflexively coughing, allow oral suction use)       Oral Care Recommendations: Oral care QID   Other Recommendations: Order thickener from [place], [facility] SLP [place] Pager [phone] Office [phone]   Macario Golds 10/19/2018,5:42 PM

## 2018-10-19 NOTE — Progress Notes (Signed)
Pharmacy Antibiotic Note  DARLY FAILS is a 85 y.o. male admitted on 10/18/2018 with sepsis.  Pharmacy has been consulted for Vancomycin, cefepime dosing.  Plan: Vancomycin 1gm iv x1, then another 1gm to make 2gm total, then  Vancomycin 1750 mg IV Q 48 hrs. Goal AUC 400-550. Expected AUC: 499.8 SCr used: 1.87  Cefepime 2gm iv x1, then 2gm iv q12hr   Height: 5\' 11"  (180.3 cm) Weight: 187 lb 13.3 oz (85.2 kg) IBW/kg (Calculated) : 75.3  Temp (24hrs), Avg:100.1 F (37.8 C), Min:98.6 F (37 C), Max:102.9 F (39.4 C)  Recent Labs  Lab 10/18/18 1900 10/18/18 1905  WBC  --  9.9  CREATININE  --  1.87*  LATICACIDVEN 1.4  --     Estimated Creatinine Clearance: 29.1 mL/min (A) (by C-G formula based on SCr of 1.87 mg/dL (H)).    No Known Allergies  Antimicrobials this admission: Vancomycin 10/19/2018 >> Cefepime 10/19/2018 >>   Dose adjustments this admission: -  Microbiology results: -  Thank you for allowing pharmacy to be a part of this patient's care.  Nani Skillern [place] 10/19/2018 2:13 AM

## 2018-10-19 NOTE — ED Notes (Signed)
ED TO INPATIENT HANDOFF REPORT  ED Nurse Name and Phone #: Gara Kroner, RN  S Name/Age/Gender Laruth Bouchard 85 y.o. male Room/Bed: WA10/WA10  Code Status   Code Status: Prior  Home/SNF/Other Skilled nursing facility Patient oriented to: self, place, time and situation Is this baseline? Yes   Triage Complete: Triage complete  Chief Complaint Gen. Weakness and Fall  Triage Note Pt arrives [facility] from [place] at [facility], states he was trying to hurry to the bathroom and tripped and fell   Allergies No Known Allergies  Level of Care/Admitting Diagnosis ED Disposition    ED Disposition Condition [place]: [place] [[id number]]  Level of Care: Telemetry [5]  Admit to tele based on following criteria: Monitor for Ischemic changes  Covid Evaluation: Confirmed COVID Negative  Diagnosis: CAP (community acquired pneumonia) [308657]  Admitting Physician: Toy Baker [[id number]]  Attending Physician: Toy Baker [[id number]]  PT Class (Do Not Modify): Observation [104]  PT Acc Code (Do Not Modify): Observation [10022]       B Medical/Surgery History Past Medical History:  Diagnosis Date  . Arthritis   . Barrett esophagus   . Bladder cancer ([place]) dx'd 8469,6295   surg only  . CHF (congestive heart failure) ([place])   . Colon polyps    adenomatous  . Difficulty sleeping    OCCASIONALLY  . Diverticulosis of colon (without mention of hemorrhage)   . Frequency of urination   . GERD (gastroesophageal reflux disease)   . Hiatal hernia   . History of bladder cancer 1981   EXCISION ONLY  . History of skin cancer    BASAL CELL  . Hyperlipidemia   . Lymphoma, non-Hodgkin's ([place]) 2011   [place]   . Macular degeneration    LEFT EYE  . Multiple nodules of lung 09/16/2011  . PAF (paroxysmal atrial fibrillation) ([place])   . Personal history of colonic polyps 07/15/2010   tubular adenoma  . Prostate cancer [facility])    patient denies  . PUD (peptic ulcer disease)   . Stomach cancer ([place])    NHL origin  . Stroke ([place])   . Weakness    Past Surgical History:  Procedure Laterality Date  . APPENDECTOMY    . bone spur     left shoulder  . CHOLECYSTECTOMY    . KNEE ARTHROPLASTY Left 07/30/2015   Procedure: LEFT TOTAL KNEE ARTHROPLASTY WITH COMPUTER NAVIGATION;  Surgeon: Rod Can, MD;  Location: [facility];  Service: Orthopedics;  Laterality: Left;  . LASER OF PROSTATE W/ GREEN LIGHT PVP    . TENDON REPAIR     right arm  . TRIGGER FINGER RELEASE     bilateral hands  . WRIST SURGERY     Bilateral     A IV Location/Drains/Wounds Patient Lines/Drains/Airways Status   Active Line/Drains/Airways    Name:   Placement date:   Placement time:   Site:   Days:   Peripheral IV 10/18/18 Right Antecubital   10/18/18    1951    Antecubital   1   Pressure Injury 12/22/17 Stage II -  Partial thickness loss of dermis presenting as a shallow open ulcer with a red, pink wound bed without slough.   12/22/17    2100     301          Intake/Output Last 24 hours No intake or output data in the 24 hours ending 10/19/18 0031  Labs/Imaging Results for orders  placed or performed during the hospital encounter of 10/18/18 (from the past 48 hour(s))  SARS Coronavirus 2 (CEPHEID - Performed in [place] lab), Hosp Order     Status: None   Collection Time: 10/18/18  6:39 PM   Specimen: Nasopharyngeal Swab  Result Value Ref Range   SARS Coronavirus 2 NEGATIVE NEGATIVE    Comment: (NOTE) If result is NEGATIVE SARS-CoV-2 target nucleic acids are NOT DETECTED. The SARS-CoV-2 RNA is generally detectable in upper and lower  respiratory specimens during the acute phase of infection. The lowest  concentration of SARS-CoV-2 viral copies this assay can detect is 250  copies / mL. A negative result does not preclude SARS-CoV-2 infection  and should not be used as the sole basis for treatment or other  patient  management decisions.  A negative result may occur with  improper specimen collection / handling, submission of specimen other  than nasopharyngeal swab, presence of viral mutation(s) within the  areas targeted by this assay, and inadequate number of viral copies  (<250 copies / mL). A negative result must be combined with clinical  observations, patient history, and epidemiological information. If result is POSITIVE SARS-CoV-2 target nucleic acids are DETECTED. The SARS-CoV-2 RNA is generally detectable in upper and lower  respiratory specimens dur ing the acute phase of infection.  Positive  results are indicative of active infection with SARS-CoV-2.  Clinical  correlation with patient history and other diagnostic information is  necessary to determine patient infection status.  Positive results do  not rule out bacterial infection or co-infection with other viruses. If result is PRESUMPTIVE POSTIVE SARS-CoV-2 nucleic acids MAY BE PRESENT.   A presumptive positive result was obtained on the submitted specimen  and confirmed on repeat testing.  While 2019 novel coronavirus  (SARS-CoV-2) nucleic acids may be present in the submitted sample  additional confirmatory testing may be necessary for epidemiological  and / or clinical management purposes  to differentiate between  SARS-CoV-2 and other Sarbecovirus currently known to infect humans.  If clinically indicated additional testing with an alternate test  methodology [phone]) is advised. The SARS-CoV-2 RNA is generally  detectable in upper and lower respiratory sp ecimens during the acute  phase of infection. The expected result is Negative. Fact Sheet for Patients:  StrictlyIdeas.no Fact Sheet for Healthcare Providers: BankingDealers.co.za This test is not yet approved or cleared by the [place] FDA and has been authorized for detection and/or diagnosis of SARS-CoV-2 by FDA under  an Emergency Use Authorization (EUA).  This EUA will remain in effect (meaning this test can be used) for the duration of the COVID-19 declaration under Section 564(b)(1) of the Act, 21 U.S.C. section 360bbb-3(b)(1), unless the authorization is terminated or revoked sooner. Performed at [facility], [place] [address], [place], [place] [address]   Lactic acid, plasma     Status: None   Collection Time: 10/18/18  7:00 PM  Result Value Ref Range   Lactic Acid, Venous 1.4 0.5 - 1.9 mmol/L    Comment: Performed at [facility], [place] [address] [place], [place] [address]  Comprehensive metabolic panel     Status: Abnormal   Collection Time: 10/18/18  7:05 PM  Result Value Ref Range   Sodium 138 135 - 145 mmol/L   Potassium 4.8 3.5 - 5.1 mmol/L   Chloride 103 98 - 111 mmol/L   CO2 25 22 - 32 mmol/L   Glucose, Bld 116 (H) 70 - 99 mg/dL  BUN 24 (H) 8 - 23 mg/dL   Creatinine, Ser 1.87 (H) 0.61 - 1.24 mg/dL   Calcium 8.6 (L) 8.9 - 10.3 mg/dL   Total Protein 7.8 6.5 - 8.1 g/dL   Albumin 2.8 (L) 3.5 - 5.0 g/dL   AST 19 15 - 41 U/L   ALT 9 0 - 44 U/L   Alkaline Phosphatase 42 38 - 126 U/L   Total Bilirubin 0.5 0.3 - 1.2 mg/dL   GFR calc non Af Amer 31 (L) >60 mL/min   GFR calc Af Amer 36 (L) >60 mL/min   Anion gap 10 5 - 15    Comment: Performed at [facility], [place] [address] [place], [place] [address]  CBC WITH DIFFERENTIAL     Status: Abnormal   Collection Time: 10/18/18  7:05 PM  Result Value Ref Range   WBC 9.9 4.0 - 10.5 K/uL   RBC 5.23 4.22 - 5.81 MIL/uL   Hemoglobin 13.9 13.0 - 17.0 g/dL   HCT 47.3 39.0 - 52.0 %   MCV 90.4 80.0 - 100.0 fL   MCH 26.6 26.0 - 34.0 pg   MCHC 29.4 (L) 30.0 - 36.0 g/dL   RDW 14.4 11.5 - 15.5 %   Platelets 204 150 - 400 K/uL   nRBC 0.0 0.0 - 0.2 %   Neutrophils Relative % 83 %   Neutro Abs 8.3 (H) 1.7 - 7.7 K/uL   Lymphocytes Relative 6 %   Lymphs Abs 0.6 (L) 0.7 - 4.0 K/uL   Monocytes  Relative 9 %   Monocytes Absolute 0.9 0.1 - 1.0 K/uL   Eosinophils Relative 1 %   Eosinophils Absolute 0.1 0.0 - 0.5 K/uL   Basophils Relative 0 %   Basophils Absolute 0.0 0.0 - 0.1 K/uL   Immature Granulocytes 1 %   Abs Immature Granulocytes 0.08 (H) 0.00 - 0.07 K/uL    Comment: Performed at [facility], [place] [address], [place], [place] [address]  APTT     Status: Abnormal   Collection Time: 10/18/18  7:05 PM  Result Value Ref Range   aPTT 40 (H) 24 - 36 seconds    Comment:        IF BASELINE aPTT IS ELEVATED, SUGGEST PATIENT RISK ASSESSMENT BE USED TO DETERMINE APPROPRIATE ANTICOAGULANT THERAPY. Performed at [facility], [place] [address], [place], [place] [address]   Protime-INR     Status: Abnormal   Collection Time: 10/18/18  7:05 PM  Result Value Ref Range   Prothrombin Time 18.6 (H) 11.4 - 15.2 seconds   INR 1.6 (H) 0.8 - 1.2    Comment: (NOTE) INR goal varies based on device and disease states. Performed at [facility], [place] [address], [place], [place] [address]   Urinalysis, Routine w reflex microscopic     Status: Abnormal   Collection Time: 10/18/18  7:11 PM  Result Value Ref Range   Color, Urine YELLOW YELLOW   APPearance HAZY (A) CLEAR   Specific Gravity, Urine 1.015 1.005 - 1.030   pH 5.0 5.0 - 8.0   Glucose, UA NEGATIVE NEGATIVE mg/dL   Hgb urine dipstick NEGATIVE NEGATIVE   Bilirubin Urine NEGATIVE NEGATIVE   Ketones, ur NEGATIVE NEGATIVE mg/dL   Protein, ur NEGATIVE NEGATIVE mg/dL   Nitrite NEGATIVE NEGATIVE   Leukocytes,Ua NEGATIVE NEGATIVE   RBC / HPF 0-5 0 - 5 RBC/hpf   WBC, UA 0-5 0 - 5 WBC/hpf   Bacteria, UA RARE (  A) NONE SEEN   Mucus PRESENT    Hyaline Casts, UA PRESENT     Comment: Performed at [facility], [place] [address] [place], [place] [address]   Ct Head Wo Contrast  Result Date: 10/18/2018 CLINICAL DATA:  Fall, head trauma EXAM: CT HEAD WITHOUT CONTRAST  TECHNIQUE: Contiguous axial images were obtained from the base of the skull through the vertex without intravenous contrast. COMPARISON:  06/30/2016 FINDINGS: Brain: There is atrophy and chronic small vessel disease changes. No acute intracranial abnormality. Specifically, no hemorrhage, hydrocephalus, mass lesion, acute infarction, or significant intracranial injury. Vascular: No hyperdense vessel or unexpected calcification. Skull: No acute calvarial abnormality. Sinuses/Orbits: Visualized paranasal sinuses and mastoids clear. Orbital soft tissues unremarkable. Other: None IMPRESSION: Atrophy, chronic microvascular disease. No acute intracranial abnormality. Electronically Signed   By: Rolm Baptise M.D.   On: 10/18/2018 19:27   Dg Chest Port 1 View  Result Date: 10/18/2018 CLINICAL DATA:  Fever EXAM: PORTABLE CHEST 1 VIEW COMPARISON:  04/08/2018 FINDINGS: Cardiomegaly. Vascular congestion. Diffuse interstitial prominence likely interstitial edema. Confluent airspace disease noted in the left perihilar and lower lung regions concerning for asymmetric edema or pneumonia. Suspect small left effusion. IMPRESSION: Cardiomegaly with vascular congestion and interstitial prominence, likely interstitial edema. Focal left perihilar and lower lobe lobe opacity which could reflect asymmetric edema or infection. Electronically Signed   By: Rolm Baptise M.D.   On: 10/18/2018 19:10    Pending Labs Unresulted Labs (From admission, onward)    Start     Ordered   10/18/18 2144  C difficile quick scan w PCR reflex  (C Difficile quick screen w PCR reflex panel)  Once, for 24 hours,   STAT     10/18/18 2143   10/18/18 2144  Gastrointestinal Panel by PCR , Stool  (Gastrointestinal Panel by PCR, Stool)  Once,   STAT     10/18/18 2143   10/18/18 1836  Lactic acid, plasma  Now then every 2 hours,   STAT     10/18/18 1838   10/18/18 1836  Blood Culture (routine x 2)  BLOOD CULTURE X 2,   STAT     10/18/18 1838   10/18/18  1836  Urine culture  ONCE - STAT,   STAT     10/18/18 1838   Signed and Held  Procalcitonin  Add-on,   R     Signed and Held   Signed and Held  Lactic acid, plasma  STAT Now then every 3 hours,   STAT     Signed and Held   Signed and Held  Magnesium  Tomorrow morning,   R    Comments: Call MD if <1.5    Signed and Held   Signed and Held  Phosphorus  Tomorrow morning,   R     Signed and Held   Signed and Held  TSH  Once,   R    Comments: Cancel if already done within 1 month and notify MD    Signed and Held   Signed and Held  Comprehensive metabolic panel  Once,   R    Comments: Cal MD for K<3.5 or >5.0    Signed and Held   Signed and Held  CBC  Once,   R    Comments: Call for hg <8.0    Signed and Held          Vitals/Pain Today's Vitals   10/18/18 2240 10/18/18 2300 10/18/18 2330 10/19/18 0000  BP:  101/61 (!) 108/59 111/62  Pulse:  84 83 74  Resp:  17 20 19   Temp: 98.6 F (37 C)     TempSrc: Oral     SpO2:  93% 95% 94%    Isolation Precautions Enteric precautions (UV disinfection)  Medications Medications  acetaminophen (TYLENOL) tablet 650 mg (650 mg Oral Not Given 10/18/18 2231)  sodium chloride 0.9 % bolus 1,000 mL (0 mLs Intravenous Stopped 10/18/18 2210)    And  sodium chloride 0.9 % bolus 1,000 mL (0 mLs Intravenous Stopped 10/18/18 2236)    And  sodium chloride 0.9 % bolus 1,000 mL (0 mLs Intravenous Stopped 10/19/18 0030)  ceFEPIme (MAXIPIME) 2 g in sodium chloride 0.9 % 100 mL IVPB (0 g Intravenous Stopped 10/18/18 2210)  metroNIDAZOLE (FLAGYL) IVPB 500 mg (0 mg Intravenous Stopped 10/18/18 2320)  vancomycin (VANCOCIN) IVPB 1000 mg/200 mL premix (0 mg Intravenous Stopped 10/19/18 0030)    Mobility walks with device High fall risk   Focused Assessments Neuro Assessment Handoff:  Swallow screen pass? N/A         Neuro Assessment: Within Defined Limits Neuro Checks:      Last Documented NIHSS Modified Score:   Has TPA been given? No If  patient is a Neuro Trauma and patient is going to OR before floor call report to [place] nurse: [phone] or [phone]     R Recommendations: See Admitting Provider Note  Report given to:   Additional Notes: N/A

## 2018-10-19 NOTE — Progress Notes (Signed)
Xcover Pt apparently failed RN swallow  Unable to take Eliquis  A/P Pafib Lovenox 1mg / kg [place] x1 Note that with his renal function this may last 24 hours.  After speech sees in AM, please decide on best course of action. Thanks

## 2018-10-19 NOTE — Progress Notes (Addendum)
[facility]                                                                              Patient Demographics  Jason [name], is a 85 y.o. male, DOB - Aug 13, 1927, NLZ:[id number]  Admit date - 10/18/2018   Admitting Physician Toy Baker, MD  Outpatient Primary MD for the patient is [name], MD  Outpatient specialists:   LOS - 0  days   Medical records reviewed and are as summarized below:    Chief Complaint  Patient presents with  . Fatigue  . Fever       Brief summary   Patient is a 85 year old male with history of chronic diastolic CHF, paroxysmal A. fib on Eliquis, CKD stage III, non-Hodgkin lymphoma, Parkinson's disease, history of aspiration pneumonia, stroke, bladder cancer, GERD, prostate CA, peptic ulcer disease presented with mechanical fall.  Per admission history, patient was rushing to get to the bathroom due to diarrhea however was so weak and hence fell.  He could not get up due to weakness, denied any injuries.  Patient also reported fevers. COVID-19 test negative   Assessment & Plan    Principal Problem:   Acute renal failure superimposed on stage 3 chronic kidney disease (HCC) -Baseline creatinine 1.5 -Ended with creatinine of 1.8 likely due to dehydration and diarrhea -Patient was placed on gentle hydration, creatinine improving to 1.5 today, at baseline  Active Problems: Sirs with community-acquired pneumonia, likely aspiration pneumonia, dysphagia -Continue IV antibiotics, overnight difficulty swallowing issues, coughing after drinking some water -SLP evaluation pending, n.p.o. for now -Continue IV vancomycin, cefepime, Flagyl Addendum Failed swallow evaluation.  Continue n.p.o. for now, MBS to be done Also requested palliative consult for goals of care   Diarrhea -Unclear etiology, follow C. difficile, GI pathogen panel   paroxysmal atrial fibrillation -Continue Eliquis, [place] VASC score at least 5, with  prior history of stroke -Rate controlled, BP soft, holding metoprolol  Chronic diastolic CHF -2D echo 9/02 showed EF of 55 to 60% with grade 1 diastolic dysfunction -Currently euvolemic, was somewhat dehydrated at the time of admission with acute kidney injury, diarrhea -Lasix currently on hold, will restart in a.m. -BP soft, holding metoprolol    Malignant neoplasm of bladder ([place]), history of non-Hodgkin lymphoma -Remote history of bladder cancer with excision only -Non-Hodgkin lymphoma status post chemo and radiation therapy, not on chemo currently, had followed with Dr Beryle Beams -Palliative medicine following outpatient     Hyperlipidemia -Continue statin    GERD (gastroesophageal reflux disease) -Restart PPI    BPH (benign prostatic hyperplasia) -Continue tamsulosin    Parkinson's disease ([place]), likely worsening, has dysphagia - Continue Sinemet -Palliative medicine consulted for goals of care   History of stroke -Continue Eliquis    Decubitus ulcer of sacral region, stage 1 -Wound care consulted  Generalized debility, poor functional status, dysphagia -Palliative medicine consulted for goals of care  Code Status: Full code DVT Prophylaxis: Received therapeutic Lovenox as patient is currently having dysphagia issues Family Communication: Discussed in detail with the patient, all imaging results, lab results explained to the patient.  Called patient's daughter, did not pick  up the phone, left detailed message for the update.   Disposition Plan: Continue IV antibiotics, SLP, wound care evaluation pending, PT evaluation  Time Spent in minutes   35 minutes  Procedures:  None  Consultants:   None  Antimicrobials:   Anti-infectives (From admission, onward)   Start     Dose/Rate Route Frequency Ordered Stop   10/20/18 2200  vancomycin (VANCOCIN) 1,750 mg in sodium chloride 0.9 % 500 mL IVPB     1,750 mg 250 mL/hr over 120 Minutes Intravenous Every 48 hours  10/19/18 0211     10/19/18 0800  ceFEPIme (MAXIPIME) 2 g in sodium chloride 0.9 % 100 mL IVPB     2 g 200 mL/hr over 30 Minutes Intravenous Every 12 hours 10/19/18 0210     10/19/18 0600  metroNIDAZOLE (FLAGYL) IVPB 500 mg     500 mg 100 mL/hr over 60 Minutes Intravenous Every 8 hours 10/19/18 0133     10/19/18 0215  vancomycin (VANCOCIN) IVPB 1000 mg/200 mL premix     1,000 mg 200 mL/hr over 60 Minutes Intravenous  Once 10/19/18 0207 10/19/18 0540   10/18/18 1900  vancomycin (VANCOCIN) IVPB 1000 mg/200 mL premix     1,000 mg 200 mL/hr over 60 Minutes Intravenous  Once 10/18/18 1855 10/19/18 0030   10/18/18 1845  ceFEPIme (MAXIPIME) 2 g in sodium chloride 0.9 % 100 mL IVPB     2 g 200 mL/hr over 30 Minutes Intravenous  Once 10/18/18 1838 10/18/18 2210   10/18/18 1845  metroNIDAZOLE (FLAGYL) IVPB 500 mg     500 mg 100 mL/hr over 60 Minutes Intravenous  Once 10/18/18 1838 10/18/18 2320   10/18/18 1845  vancomycin (VANCOCIN) IVPB 1000 mg/200 mL premix  Status:  Discontinued     1,000 mg 200 mL/hr over 60 Minutes Intravenous  Once 10/18/18 1838 10/18/18 1842         Medications  Scheduled Meds: . acetaminophen  650 mg Oral Once  . carbidopa-levodopa  1 tablet Oral 3 times per day  . simvastatin  20 mg Oral QPM  . tamsulosin  0.4 mg Oral QHS   Continuous Infusions: . sodium chloride 75 mL/hr at 10/19/18 0220  . ceFEPime (MAXIPIME) IV 2 g (10/19/18 0847)  . metronidazole 500 mg (10/19/18 0737)  . [START ON 10/20/2018] vancomycin     PRN Meds:.acetaminophen **OR** acetaminophen, HYDROcodone-acetaminophen, ondansetron **OR** ondansetron (ZOFRAN) IV      Subjective:   Jason [name] was seen and examined today.  Alert and oriented, overnight had coughing after drinking water, currently n.p.o., awaiting SLP evaluation.  Denies any pain. Patient denies dizziness, chest pain, shortness of breath, abdominal pain.  No fevers this morning.  Objective:   Vitals:   10/19/18 0100  10/19/18 0141 10/19/18 0159 10/19/18 0543  BP: 119/66 124/72  (!) 107/59  Pulse: 76 79  81  Resp: (!) 23 18  20   Temp:  98.6 F (37 C)  98.1 F (36.7 C)  TempSrc:  Oral  Oral  SpO2: 94% 98%  98%  Weight:   85.2 kg   Height:   5\' 11"  (1.803 m)     Intake/Output Summary (Last 24 hours) at 10/19/2018 1049 Last data filed at 10/19/2018 0220 Gross per 24 hour  Intake 202.24 ml  Output -  Net 202.24 ml     Wt Readings from Last 3 Encounters:  10/19/18 85.2 kg  05/04/18 88.1 kg  04/12/18 88.3 kg     Exam  General:  Alert and oriented x 3, NAD  Eyes: ,  HEENT:  Atraumatic, normocephalic, normal oropharynx  Cardiovascular: S1 S2 auscultated,. Regular rate and rhythm.  Respiratory: Clear to auscultation bilaterally, no wheezing, rales or rhonchi  Gastrointestinal: Soft, nontender, nondistended, + bowel sounds  Ext: no pedal edema bilaterally  Neuro: Global weakness, no new FND's  Musculoskeletal: No digital cyanosis, clubbing  Skin: Stage I sacral decub, see below  Psych: Normal affect and demeanor, alert and oriented x3        Data Reviewed:  I have personally reviewed following labs and imaging studies  Micro Results Recent Results (from the past 240 hour(s))  SARS Coronavirus 2 (CEPHEID - Performed in [place] hospital lab), Hosp Order     Status: None   Collection Time: 10/18/18  6:39 PM   Specimen: Nasopharyngeal Swab  Result Value Ref Range Status   SARS Coronavirus 2 NEGATIVE NEGATIVE Final    Comment: (NOTE) If result is NEGATIVE SARS-CoV-2 target nucleic acids are NOT DETECTED. The SARS-CoV-2 RNA is generally detectable in upper and lower  respiratory specimens during the acute phase of infection. The lowest  concentration of SARS-CoV-2 viral copies this assay can detect is 250  copies / mL. A negative result does not preclude SARS-CoV-2 infection  and should not be used as the sole basis for treatment or other  patient management decisions.   A negative result may occur with  improper specimen collection / handling, submission of specimen other  than nasopharyngeal swab, presence of viral mutation(s) within the  areas targeted by this assay, and inadequate number of viral copies  (<250 copies / mL). A negative result must be combined with clinical  observations, patient history, and epidemiological information. If result is POSITIVE SARS-CoV-2 target nucleic acids are DETECTED. The SARS-CoV-2 RNA is generally detectable in upper and lower  respiratory specimens dur ing the acute phase of infection.  Positive  results are indicative of active infection with SARS-CoV-2.  Clinical  correlation with patient history and other diagnostic information is  necessary to determine patient infection status.  Positive results do  not rule out bacterial infection or co-infection with other viruses. If result is PRESUMPTIVE POSTIVE SARS-CoV-2 nucleic acids MAY BE PRESENT.   A presumptive positive result was obtained on the submitted specimen  and confirmed on repeat testing.  While 2019 novel coronavirus  (SARS-CoV-2) nucleic acids may be present in the submitted sample  additional confirmatory testing may be necessary for epidemiological  and / or clinical management purposes  to differentiate between  SARS-CoV-2 and other Sarbecovirus currently known to infect humans.  If clinically indicated additional testing with an alternate test  methodology [id number]) is advised. The SARS-CoV-2 RNA is generally  detectable in upper and lower respiratory sp ecimens during the acute  phase of infection. The expected result is Negative. Fact Sheet for Patients:  StrictlyIdeas.no Fact Sheet for Healthcare Providers: BankingDealers.co.za This test is not yet approved or cleared by the [place] FDA and has been authorized for detection and/or diagnosis of SARS-CoV-2 by FDA under an Emergency Use  Authorization (EUA).  This EUA will remain in effect (meaning this test can be used) for the duration of the COVID-19 declaration under Section 564(b)(1) of the Act, 21 U.S.C. section 360bbb-3(b)(1), unless the authorization is terminated or revoked sooner. Performed at [facility], [place] [address] [place], [place] [address]   MRSA PCR Screening     Status: Abnormal   Collection Time: 10/19/18  2:30  AM   Specimen: Nasopharyngeal  Result Value Ref Range Status   MRSA by PCR POSITIVE (A) NEGATIVE Final    Comment:        The GeneXpert MRSA Assay (FDA approved for NASAL specimens only), is one component of a comprehensive MRSA colonization surveillance program. It is not intended to diagnose MRSA infection nor to guide or monitor treatment for MRSA infections. RESULT CALLED TO, READ BACK BY AND VERIFIED WITH: C.GRIFFITH RN AT 0909 ON 10/19/2018 BY S.VANHOORNE Performed at [facility], [place] [address] [place], [place] [address]     Radiology Reports Ct Head Wo Contrast  Result Date: 10/18/2018 CLINICAL DATA:  Fall, head trauma EXAM: CT HEAD WITHOUT CONTRAST TECHNIQUE: Contiguous axial images were obtained from the base of the skull through the vertex without intravenous contrast. COMPARISON:  06/30/2016 FINDINGS: Brain: There is atrophy and chronic small vessel disease changes. No acute intracranial abnormality. Specifically, no hemorrhage, hydrocephalus, mass lesion, acute infarction, or significant intracranial injury. Vascular: No hyperdense vessel or unexpected calcification. Skull: No acute calvarial abnormality. Sinuses/Orbits: Visualized paranasal sinuses and mastoids clear. Orbital soft tissues unremarkable. Other: None IMPRESSION: Atrophy, chronic microvascular disease. No acute intracranial abnormality. Electronically Signed   By: Rolm Baptise M.D.   On: 10/18/2018 19:27   Dg Chest Port 1 View  Result Date: 10/18/2018 CLINICAL DATA:   Fever EXAM: PORTABLE CHEST 1 VIEW COMPARISON:  04/08/2018 FINDINGS: Cardiomegaly. Vascular congestion. Diffuse interstitial prominence likely interstitial edema. Confluent airspace disease noted in the left perihilar and lower lung regions concerning for asymmetric edema or pneumonia. Suspect small left effusion. IMPRESSION: Cardiomegaly with vascular congestion and interstitial prominence, likely interstitial edema. Focal left perihilar and lower lobe lobe opacity which could reflect asymmetric edema or infection. Electronically Signed   By: Rolm Baptise M.D.   On: 10/18/2018 19:10    Lab Data:  CBC: Recent Labs  Lab 10/18/18 1905 10/19/18 0611  WBC 9.9 7.1  NEUTROABS 8.3*  --   HGB 13.9 12.5*  HCT 47.3 41.0  MCV 90.4 89.9  PLT 204 604   Basic Metabolic Panel: Recent Labs  Lab 10/18/18 1905 10/19/18 0611  NA 138 138  K 4.8 4.3  CL 103 109  CO2 25 21*  GLUCOSE 116* 94  BUN 24* 23  CREATININE 1.87* 1.53*  CALCIUM 8.6* 8.0*  MG  --  1.8  PHOS  --  3.1   GFR: Estimated Creatinine Clearance: 35.5 mL/min (A) (by C-G formula based on SCr of 1.53 mg/dL (H)). Liver Function Tests: Recent Labs  Lab 10/18/18 1905 10/19/18 0611  AST 19 17  ALT 9 9  ALKPHOS 42 32*  BILITOT 0.5 0.5  PROT 7.8 6.4*  ALBUMIN 2.8* 2.3*   No results for input(s): LIPASE, AMYLASE in the last 168 hours. No results for input(s): AMMONIA in the last 168 hours. Coagulation Profile: Recent Labs  Lab 10/18/18 1905  INR 1.6*   Cardiac Enzymes: No results for input(s): CKTOTAL, CKMB, CKMBINDEX, TROPONINI in the last 168 hours. BNP (last 3 results) No results for input(s): PROBNP in the last 8760 hours. HbA1C: No results for input(s): HGBA1C in the last 72 hours. CBG: No results for input(s): GLUCAP in the last 168 hours. Lipid Profile: No results for input(s): CHOL, HDL, LDLCALC, TRIG, CHOLHDL, LDLDIRECT in the last 72 hours. Thyroid Function Tests: Recent Labs    10/19/18 0611  TSH 2.038    Anemia Panel: No results for input(s): VITAMINB12, FOLATE, FERRITIN, TIBC, IRON, RETICCTPCT in  the last 72 hours. Urine analysis:    Component Value Date/Time   COLORURINE YELLOW 10/18/2018 1911   APPEARANCEUR HAZY (A) 10/18/2018 1911   LABSPEC 1.015 10/18/2018 1911   PHURINE 5.0 10/18/2018 1911   GLUCOSEU NEGATIVE 10/18/2018 1911   HGBUR NEGATIVE 10/18/2018 1911   BILIRUBINUR NEGATIVE 10/18/2018 1911   KETONESUR NEGATIVE 10/18/2018 1911   PROTEINUR NEGATIVE 10/18/2018 1911   UROBILINOGEN 1.0 07/07/2011 1942   NITRITE NEGATIVE 10/18/2018 1911   LEUKOCYTESUR NEGATIVE 10/18/2018 1911     Ripudeep Rai M.D. [facility] 10/19/2018, 10:49 AM  Pager: [phone] Between 7am to 7pm - call Pager - [phone]  After 7pm go to [email or web address] - password [email or web address]  Call night coverage person covering after 7pm

## 2018-10-19 NOTE — Evaluation (Addendum)
Occupational Therapy Evaluation Patient Details Name: Jason [name] MRN: [id number] DOB: 10-06-1927 Today's Date: 10/19/2018    History of Present Illness 85 y.o. male with medical history significant of chronic diastolic heart failure, bladder cancer, prostate cancer, CVA with residual swallowing deficits, macular degeneration afib, and admitted after fall at facility for PNA, SIRS, and dysphagia   Clinical Impression   Pt was admitted for the above. He states that he had assistance for bathing and dressing at baseline.  Pt needs min A for transfers, mod A for bed mobility and up to max A for hygiene and LB adls. Will follow in acute setting with min guard level goals.      Follow Up Recommendations  SNF(or back to [facility], if they can manage asisstance needed If back to ALF, [place]   Equipment Recommendations  (tba further)    Recommendations for Other Services       Precautions / Restrictions Precautions Precautions: Fall Restrictions Weight Bearing Restrictions: No      Mobility Bed Mobility Overal bed mobility: Needs Assistance Bed Mobility: Supine to Sit      Sit to supine: Mod assist   General bed mobility comments: for bil LEs  Transfers Overall transfer level: Needs assistance Equipment used: Rolling walker (2 wheeled) Transfers: Sit to/from [facility] Sit to Stand: Min assist Stand pivot transfers: Min assist       General transfer comment: assist to rise and steady    Balance Overall balance assessment: History of Falls                                         ADL either performed or assessed with clinical judgement   ADL Overall ADL's : Needs assistance/impaired Eating/Feeding: Set up   Grooming: Set up   Upper Body Bathing: Minimal assistance   Lower Body Bathing: Moderate assistance   Upper Body Dressing : Minimal assistance   Lower Body Dressing: Maximal assistance   Toilet Transfer: Minimal  assistance;Ambulation;RW(around bed to other side)   Toileting- Clothing Manipulation and Hygiene: Maximal assistance         General ADL Comments: ambulated around bed before getting back into bed.  Pt with little drops of diarrhea with movement.  SLP had just told him he couldn't eat until he had an xray test.  Pt either did not remember this or didn't hear her     Vision         Perception     Praxis      Pertinent Vitals/Pain Pain Assessment: No/denies pain     Hand Dominance Right   Extremity/Trunk Assessment Upper Extremity Assessment Upper Extremity Assessment: Generalized weakness          Communication Communication Communication: No difficulties   Cognition Arousal/Alertness: Awake/alert Behavior During Therapy: WFL for tasks assessed/performed                                   General Comments: pleasant, follows commands, HOH, ? memory vs didn't hear   General Comments       Exercises     Shoulder Instructions      Home Living Family/patient expects to be discharged to:: Skilled nursing facility  Additional Comments: from [facility]      Prior Functioning/Environment Level of Independence: Needs assistance  Gait / Transfers Assistance Needed: pt reports he is ambulatory with RW ADL's / Homemaking Assistance Needed: requires assist for ADLs            OT Problem List: Decreased strength;Decreased activity tolerance;Decreased cognition;Decreased knowledge of use of DME or AE      OT Treatment/Interventions: Self-care/ADL training;Therapeutic exercise;DME and/or AE instruction;Patient/family education;Balance training;Therapeutic activities    OT Goals(Current goals can be found in the care plan section) Acute Rehab OT Goals Patient Stated Goal: none stated OT Goal Formulation: With patient Time For Goal Achievement: 11/02/18 Potential to Achieve Goals: Good ADL  Goals Pt Will Perform Grooming: standing;with supervision Pt Will Transfer to Toilet: with min guard assist;ambulating;bedside commode Additional ADL Goal #1: pt will perform bed mobility with min guard in preparation for toilet transfers  OT Frequency: Min 2X/week   Barriers to D/C:            Co-evaluation              AM-PAC OT "6 Clicks" Daily Activity     Outcome Measure Help from another person eating meals?: A Little Help from another person taking care of personal grooming?: A Little Help from another person toileting, which includes using toliet, bedpan, or urinal?: A Lot Help from another person bathing (including washing, rinsing, drying)?: A Lot Help from another person to put on and taking off regular upper body clothing?: A Little Help from another person to put on and taking off regular lower body clothing?: A Lot 6 Click Score: 15   End of Session    Activity Tolerance: Patient limited by fatigue Patient left: in bed;with call bell/phone within reach;with bed alarm set  OT Visit Diagnosis: Unsteadiness on feet (R26.81);Muscle weakness (generalized) (M62.81)                Time: 9485-4627 OT Time Calculation (min): 17 min Charges:  OT General Charges $OT Visit: 1 Visit OT Evaluation $OT Eval Low Complexity: [place], OTR/L Acute Rehabilitation Services [phone] [facility] pager [phone] office 10/19/2018  [place] 10/19/2018, 1:56 PM

## 2018-10-19 NOTE — Progress Notes (Signed)
Pharmacy Antibiotic Note  Jason [name] is a 85 y.o. male admitted on 10/18/2018 with SIRS, likely aspiration pneumonia. Pharmacy has been consulted for Vancomycin and Cefepime dosing. Patient also on Metronidazole per MD.   Plan: Adjust Vancomycin to 1g IV q24h with improved renal function Vancomycin levels at steady state, as indicated Continue Cefepime 2g IV q12h  Metronidazole 500mg  IV q8h per MD Monitor renal function, cultures, clinical course  Height: 5\' 11"  (180.3 cm) Weight: 187 lb 13.3 oz (85.2 kg) IBW/kg (Calculated) : 75.3  Temp (24hrs), Avg:99.4 F (37.4 C), Min:97.9 F (36.6 C), Max:102.9 F (39.4 C)  Recent Labs  Lab 10/18/18 1900 10/18/18 1905 10/19/18 0207 10/19/18 0611 10/19/18 0938  WBC  --  9.9  --  7.1  --   CREATININE  --  1.87*  --  1.53*  --   LATICACIDVEN 1.4  --  1.4 1.0 0.9    Estimated Creatinine Clearance: 35.5 mL/min (A) (by C-G formula based on SCr of 1.53 mg/dL (H)).    No Known Allergies    Thank you for allowing pharmacy to be a part of this patient's care.   Lindell Spar, PharmD, BCPS Clinical Pharmacist  10/19/2018 4:33 PM

## 2018-10-19 NOTE — Evaluation (Signed)
Clinical/Bedside Swallow Evaluation Patient Details  Name: Jason [name] MRN: [id number] Date of Birth: 05-Jun-1927  Today's Date: 10/19/2018 Time: SLP Start Time (ACUTE ONLY): 2831 SLP Stop Time (ACUTE ONLY): 1301 SLP Time Calculation (min) (ACUTE ONLY): 22 min  Past Medical History:  Past Medical History:  Diagnosis Date  . Arthritis   . Barrett esophagus   . Bladder cancer ([place]) dx'd 5176,1607   surg only  . CHF (congestive heart failure) ([place])   . Colon polyps    adenomatous  . Difficulty sleeping    OCCASIONALLY  . Diverticulosis of colon (without mention of hemorrhage)   . Frequency of urination   . GERD (gastroesophageal reflux disease)   . Hiatal hernia   . History of bladder cancer 1981   EXCISION ONLY  . History of skin cancer    BASAL CELL  . Hyperlipidemia   . Lymphoma, non-Hodgkin's ([place]) 2011   [place]   . Macular degeneration    LEFT EYE  . Multiple nodules of lung 09/16/2011  . PAF (paroxysmal atrial fibrillation) ([place])   . Personal history of colonic polyps 07/15/2010   tubular adenoma  . Prostate cancer [facility])    patient denies  . PUD (peptic ulcer disease)   . Stomach cancer ([place])    [facility] origin  . Stroke ([place])   . Weakness    Past Surgical History:  Past Surgical History:  Procedure Laterality Date  . APPENDECTOMY    . bone spur     left shoulder  . CHOLECYSTECTOMY    . KNEE ARTHROPLASTY Left 07/30/2015   Procedure: LEFT TOTAL KNEE ARTHROPLASTY WITH COMPUTER NAVIGATION;  Surgeon: Rod Can, MD;  Location: [facility];  Service: Orthopedics;  Laterality: Left;  . LASER OF PROSTATE W/ GREEN LIGHT PVP    . TENDON REPAIR     right arm  . TRIGGER FINGER RELEASE     bilateral hands  . WRIST SURGERY     Bilateral   HPI:  85 yo male admitted from [facility] with AMS, found to have renal failure and possible pna.   Assessment / Plan / Recommendation Clinical Impression  Patient presents with clinical indications concerning for  significant dysphagia and aspiration.  His CN exam is unremarkable except for decreased phonatory strength and cough.  He self reports more problems swallowing foods than liquids.  Intake of cup sip of cranberry juice resulted in pt immediately excessively coughing and expectoration clear secretions mixed with juice  Grossly wet voice noted with pt being able to cough/clear throat for airway protection.  Pt tolerated clinically boluses of applesauce without deficits.  Unfortunately pt states this coughing is his baseline and he didn't seem to be alarmed, but he does admit worsening dysphagia since his last MBS Nov 2019.  Given he now has possible pulmonary infection with h/o Parkinson's and is a full code, recommend MBS to allow deliniation of source of dysphagia and help determine LRD.  SLP set up oral suction and demonstrated its use to pt to allow him to use as indicated. SLP Visit Diagnosis: Dysphagia, oropharyngeal phase (R13.12)    Aspiration Risk  Severe aspiration risk    Diet Recommendation NPO   Medication Administration: Whole meds with puree    Other  Recommendations Oral Care Recommendations: Oral care QID   Follow up Recommendations (tbd)      Frequency and Duration      tbd      Prognosis   tbd  Swallow Study   General Date of Onset: 10/19/18 HPI: 85 yo male admitted from [facility] with AMS, found to have renal failure and possible pna. Type of Study: Bedside Swallow Evaluation Diet Prior to this Study: NPO Temperature Spikes Noted: No Respiratory Status: Room air Behavior/Cognition: Alert;Cooperative Oral Care Completed by SLP: Yes Oral Cavity - Dentition: Adequate natural dentition Vision: Functional for self-feeding Self-Feeding Abilities: Able to feed self Patient Positioning: Upright in chair Baseline Vocal Quality: Low vocal intensity Volitional Cough: Weak Volitional Swallow: Able to elicit    Oral/Motor/Sensory Function Overall Oral Motor/Sensory  Function: Generalized oral weakness   Ice Chips Ice chips: Not tested   Thin Liquid Thin Liquid: Impaired Pharyngeal  Phase Impairments: Cough - Immediate;Wet Vocal Quality;Multiple swallows Other Comments: grossly coughing with expectoration of secretions - clear and copious mixed with juice    Nectar Thick Nectar Thick Liquid: Not tested   Honey Thick Honey Thick Liquid: Not tested   Puree Puree: Within functional limits Presentation: Spoon   Solid     Solid: Not tested      Macario Golds 10/19/2018,2:25 PM  Luanna Salk, [facility] SLP Acute Rehab Services Pager [phone] Office [phone]

## 2018-10-19 NOTE — Consult Note (Signed)
[facility] Nurse wound consult note Reason for Consult: sacral pressure injury Wound type: Stage 1 sacral /gluteal cleft pressure injury Pressure Injury POA: NA Measurement: 2.0cm x 3.0cmx 0cm  Wound bed: 100% pink, non blanchable tissue Drainage (amount, consistency, odor) none Periwound: intact  Dressing procedure/placement/frequency: Silicone foam implemented by nursing staff is appropriate.    Discussed POC with patient and bedside nurse.  Re consult if needed, will not follow at this time. Thanks  Audreana H[employer], RN,CWOCN, CNS, [place] [phone])

## 2018-10-19 NOTE — Evaluation (Signed)
Physical Therapy Evaluation Patient Details Name: Jason [name] MRN: [id number] DOB: Oct 01, 1927 Today's Date: 10/19/2018   History of Present Illness  85 y.o. male with medical history significant of chronic diastolic heart failure, bladder cancer, prostate cancer, CVA with residual swallowing deficits, macular degeneration afib, and admitted after fall at facility for PNA, SIRS, and dysphagia  Clinical Impression  Pt admitted with above diagnosis. Pt currently with functional limitations due to the deficits listed below (see PT Problem List).  Pt will benefit from skilled PT to increase their independence and safety with mobility to allow discharge to the venue listed below.  Pt reports he is ambulatory at ALF however requires assist for dressing.  Pt has history of falls.  Pt reports he also has w/c and suspects he will have to use it upon returning.  Pt may benefit from SNF upon d/c unless ALF can provide current physical assist level.     Follow Up Recommendations SNF    Equipment Recommendations  None recommended by PT    Recommendations for Other Services       Precautions / Restrictions Precautions Precautions: Fall      Mobility  Bed Mobility Overal bed mobility: Needs Assistance Bed Mobility: Supine to Sit     Supine to sit: Mod assist     General bed mobility comments: verbal cues for sequence, assist for LEs over EOB and trunk upright  Transfers Overall transfer level: Needs assistance Equipment used: Rolling walker (2 wheeled) Transfers: Sit to/from [employer] Transfers Sit to Stand: Min assist;From elevated surface Stand pivot transfers: Min assist       General transfer comment: verbal cues for safe technique, pt fatigued quickly  Ambulation/Gait                Stairs            Wheelchair Mobility    Modified Rankin (Stroke Patients Only)       Balance Overall balance assessment: History of Falls                                            Pertinent Vitals/Pain Pain Assessment: No/denies pain    Home Living Family/patient expects to be discharged to:: Skilled nursing facility                 Additional Comments: from [facility]    Prior Function Level of Independence: Needs assistance   Gait / Transfers Assistance Needed: pt reports he is ambulatory with RW  ADL's / Homemaking Assistance Needed: requires assist for ADLs        Hand Dominance        Extremity/Trunk Assessment        Lower Extremity Assessment Lower Extremity Assessment: Generalized weakness       Communication   Communication: No difficulties  Cognition Arousal/Alertness: Awake/alert Behavior During Therapy: WFL for tasks assessed/performed                                   General Comments: pleasant and follows commands, HOH      General Comments      Exercises     Assessment/Plan    PT Assessment Patient needs continued PT services  PT Problem List Decreased strength;Decreased mobility;Decreased activity tolerance;Decreased balance;Decreased knowledge of use of DME;Decreased  safety awareness       PT Treatment Interventions DME instruction;Therapeutic activities;Gait training;Functional mobility training;Stair training;Therapeutic exercise;Patient/family education;Balance training    PT Goals (Current goals can be found in the Care Plan section)  Acute Rehab PT Goals PT Goal Formulation: With patient Time For Goal Achievement: 11/02/18 Potential to Achieve Goals: Good    Frequency Min 2X/week   Barriers to discharge        Co-evaluation               AM-PAC PT "6 Clicks" Mobility  Outcome Measure Help needed turning from your back to your side while in a flat bed without using bedrails?: A Little Help needed moving from lying on your back to sitting on the side of a flat bed without using bedrails?: A Little Help needed moving to and from a  bed to a chair (including a wheelchair)?: A Little Help needed standing up from a chair using your arms (e.g., wheelchair or bedside chair)?: A Little Help needed to walk in hospital room?: A Lot Help needed climbing 3-5 steps with a railing? : A Lot 6 Click Score: 16    End of Session Equipment Utilized During Treatment: Gait belt Activity Tolerance: Patient tolerated treatment well Patient left: in chair;with chair alarm set;with call bell/phone within reach Nurse Communication: Mobility status PT Visit Diagnosis: Other abnormalities of gait and mobility (R26.89);Muscle weakness (generalized) (M62.81)    Time: 4917-9150 PT Time Calculation (min) (ACUTE ONLY): 14 min   Carmelia Bake, PT, DPT Acute Rehabilitation Services Office: [phone] Pager: [phone]  Trena Platt 10/19/2018, 11:17 AM

## 2018-10-20 DIAGNOSIS — Z7189 Other specified counseling: Secondary | ICD-10-CM

## 2018-10-20 LAB — BASIC METABOLIC PANEL
Anion gap: 10 (ref 5–15)
BUN: 24 mg/dL — ABNORMAL HIGH (ref 8–23)
CO2: 19 mmol/L — ABNORMAL LOW (ref 22–32)
Calcium: 8 mg/dL — ABNORMAL LOW (ref 8.9–10.3)
Chloride: 110 mmol/L (ref 98–111)
Creatinine, Ser: 1.39 mg/dL — ABNORMAL HIGH (ref 0.61–1.24)
GFR calc Af Amer: 52 mL/min — ABNORMAL LOW (ref >60)
GFR calc non Af Amer: 45 mL/min — ABNORMAL LOW (ref >60)
Glucose, Bld: 76 mg/dL (ref 70–99)
Potassium: 4.5 mmol/L (ref 3.5–5.1)
Sodium: 139 mmol/L (ref 135–145)

## 2018-10-20 LAB — URINE CULTURE: Culture: NO GROWTH

## 2018-10-20 LAB — CBC
HCT: 43.2 % (ref 39.0–52.0)
Hemoglobin: 12.8 g/dL — ABNORMAL LOW (ref 13.0–17.0)
MCH: 26.9 pg (ref 26.0–34.0)
MCHC: 29.6 g/dL — ABNORMAL LOW (ref 30.0–36.0)
MCV: 90.8 fL (ref 80.0–100.0)
Platelets: 142 10*3/uL — ABNORMAL LOW (ref 150–400)
RBC: 4.76 MIL/uL (ref 4.22–5.81)
RDW: 14.6 % (ref 11.5–15.5)
WBC: 11.3 10*3/uL — ABNORMAL HIGH (ref 4.0–10.5)
nRBC: 0 % (ref 0.0–0.2)

## 2018-10-20 LAB — C DIFFICILE QUICK SCREEN W PCR REFLEX
C Diff antigen: POSITIVE — AB
C Diff interpretation: DETECTED
C Diff toxin: POSITIVE — AB

## 2018-10-20 MED ORDER — RESOURCE THICKENUP CLEAR PO POWD
ORAL | Status: DC | PRN
  Filled 2018-10-20: qty 125

## 2018-10-20 MED ORDER — VANCOMYCIN 50 MG/ML ORAL SOLUTION
125.0000 mg | Freq: Four times a day (QID) | ORAL | Status: DC
  Administered 2018-10-20 – 2018-10-24 (×13): 125 mg via ORAL
  Filled 2018-10-20 (×20): qty 2.5

## 2018-10-20 MED ORDER — AMOXICILLIN-POT CLAVULANATE 875-125 MG PO TABS
1.0000 | ORAL_TABLET | Freq: Two times a day (BID) | ORAL | Status: AC
  Administered 2018-10-21 – 2018-10-23 (×5): 1 via ORAL
  Filled 2018-10-20 (×5): qty 1

## 2018-10-20 NOTE — Progress Notes (Signed)
[facility]                                                                              Patient Demographics  Jason [name], is a 85 y.o. male, DOB - 08-17-1927, PNT:[id number]  Admit date - 10/18/2018   Admitting Physician Toy Baker, MD  Outpatient Primary MD for the patient is [name], MD  Outpatient specialists:   LOS - 1  days   Medical records reviewed and are as summarized below:    Chief Complaint  Patient presents with  . Fatigue  . Fever       Brief summary   Patient is a 85 year old male with history of chronic diastolic CHF, paroxysmal A. fib on Eliquis, CKD stage III, non-Hodgkin lymphoma, Parkinson's disease, history of aspiration pneumonia, stroke, bladder cancer, GERD, prostate CA, peptic ulcer disease presented with mechanical fall.  Per admission history, patient was rushing to get to the bathroom due to diarrhea however was so weak and hence fell.  He could not get up due to weakness, denied any injuries.  Patient also reported fevers. COVID-19 test negative   Assessment & Plan    Principal Problem:   Acute renal failure superimposed on stage 3 chronic kidney disease (HCC) -Baseline creatinine 1.5, presented with creatinine of 1.8, due to dehydration and diarrhea. -Placed on gentle hydration, creatinine now improved to baseline 1.3  Active Problems: Sirs with community-acquired pneumonia, likely aspiration pneumonia, dysphagia --Continue IV vancomycin, cefepime, Flagyl -Per daughter, he has longtime swallowing issues.  Requested oral diet for his SNF to take him back -For now placed on dysphagia 1 diet with nectar thick liquids, will request speech therapist to reevaluate in a.m.   Diarrhea -Improved   paroxysmal atrial fibrillation -Continue Eliquis, [place] VASC score at least 5, with prior history of stroke -Rate controlled, BP soft, holding metoprolol  Chronic diastolic CHF -2D echo 0/86 showed EF of 55 to  60% with grade 1 diastolic dysfunction -Currently euvolemic, was somewhat dehydrated at the time of admission with acute kidney injury, diarrhea -Lasix currently on hold.  BP soft, metoprolol currently on hold    Malignant neoplasm of bladder ([place]), history of non-Hodgkin lymphoma -Remote history of bladder cancer with excision only -Non-Hodgkin lymphoma status post chemo and radiation therapy, not on chemo currently, had followed with Dr Beryle Beams -Palliative medicine following outpatient     Hyperlipidemia -Continue statin    GERD (gastroesophageal reflux disease) -Restart PPI    BPH (benign prostatic hyperplasia) -Continue tamsulosin    Parkinson's disease ([place]), likely worsening, has dysphagia - Continue Sinemet -Palliative medicine consulted for goals of care   History of stroke -Continue Eliquis    Decubitus ulcer of sacral region, stage 1 -Wound care consulted  Generalized debility, poor functional status, dysphagia -Palliative medicine consulted, goals of care addressed today, DNR status, will need repeat speech therapy evaluation for possible upgrade to solid diet per his SNF requirement  Code Status: Full code DVT Prophylaxis: Received therapeutic Lovenox as patient is currently having dysphagia issues Family Communication: Discussed in detail with the patient, all imaging results, lab results explained to the patient.  Palliative  medicine discussed with patient's daughter today.   Disposition Plan: Will benefit from hospice at facility or residential hospice  Time Spent in minutes   25 minutes  Procedures:  None  Consultants:   None  Antimicrobials:   Anti-infectives (From admission, onward)   Start     Dose/Rate Route Frequency Ordered Stop   10/20/18 2200  vancomycin (VANCOCIN) 1,750 mg in sodium chloride 0.9 % 500 mL IVPB  Status:  Discontinued     1,750 mg 250 mL/hr over 120 Minutes Intravenous Every 48 hours 10/19/18 0211 10/19/18 1630    10/20/18 0600  vancomycin (VANCOCIN) IVPB 1000 mg/200 mL premix     1,000 mg 200 mL/hr over 60 Minutes Intravenous Every 24 hours 10/19/18 1630     10/19/18 0800  ceFEPIme (MAXIPIME) 2 g in sodium chloride 0.9 % 100 mL IVPB     2 g 200 mL/hr over 30 Minutes Intravenous Every 12 hours 10/19/18 0210     10/19/18 0600  metroNIDAZOLE (FLAGYL) IVPB 500 mg     500 mg 100 mL/hr over 60 Minutes Intravenous Every 8 hours 10/19/18 0133     10/19/18 0215  vancomycin (VANCOCIN) IVPB 1000 mg/200 mL premix     1,000 mg 200 mL/hr over 60 Minutes Intravenous  Once 10/19/18 0207 10/19/18 0540   10/18/18 1900  vancomycin (VANCOCIN) IVPB 1000 mg/200 mL premix     1,000 mg 200 mL/hr over 60 Minutes Intravenous  Once 10/18/18 1855 10/19/18 0030   10/18/18 1845  ceFEPIme (MAXIPIME) 2 g in sodium chloride 0.9 % 100 mL IVPB     2 g 200 mL/hr over 30 Minutes Intravenous  Once 10/18/18 1838 10/18/18 2210   10/18/18 1845  metroNIDAZOLE (FLAGYL) IVPB 500 mg     500 mg 100 mL/hr over 60 Minutes Intravenous  Once 10/18/18 1838 10/18/18 2320   10/18/18 1845  vancomycin (VANCOCIN) IVPB 1000 mg/200 mL premix  Status:  Discontinued     1,000 mg 200 mL/hr over 60 Minutes Intravenous  Once 10/18/18 1838 10/18/18 1842         Medications  Scheduled Meds: . acetaminophen  650 mg Oral Once  . carbidopa-levodopa  1 tablet Oral 3 times per day  . enoxaparin (LOVENOX) injection  1 mg/kg Subcutaneous Q12H  . pantoprazole  40 mg Oral Q0600  . simvastatin  20 mg Oral QPM  . tamsulosin  0.4 mg Oral QHS   Continuous Infusions: . ceFEPime (MAXIPIME) IV 2 g (10/20/18 1041)  . metronidazole Stopped (10/20/18 0610)  . vancomycin 1,000 mg (10/20/18 0611)   PRN Meds:.acetaminophen **OR** acetaminophen, HYDROcodone-acetaminophen, ondansetron **OR** ondansetron (ZOFRAN) IV      Subjective:   Jason [name] was seen and examined today.  Denies any specific complaints, n.p.o. due to dysphagia and aspiration issues.   Denies any pain.  Patient denies dizziness, chest pain, shortness of breath, abdominal pain.  No fever  Objective:   Vitals:   10/19/18 1811 10/19/18 2151 10/20/18 0511 10/20/18 0940  BP: 123/74 124/75 138/75 115/65  Pulse: 75 86 91 90  Resp: 18 18 18 20   Temp: 98.1 F (36.7 C) 98.4 F (36.9 C) 98.7 F (37.1 C) 97.6 F (36.4 C)  TempSrc:  Oral Oral Oral  SpO2: 99% 98% 98% 98%  Weight:      Height:        Intake/Output Summary (Last 24 hours) at 10/20/2018 1350 Last data filed at 10/20/2018 0900 Gross per 24 hour  Intake 496.79 ml  Output  250 ml  Net 246.79 ml     Wt Readings from Last 3 Encounters:  10/19/18 85.2 kg  05/04/18 88.1 kg  04/12/18 88.3 kg   Physical Exam  General: Alert and oriented x 3, NAD, hearing deficit  Eyes:   HEENT:  Atraumatic, normocephalic  Cardiovascular: S1 S2 clear,RRR. No pedal edema b/l  Respiratory: CTAB, no wheezing, rales or rhonchi  Gastrointestinal: Soft, nontender, nondistended, NBS  Ext: no pedal edema bilaterally  Neuro: no new deficits  Musculoskeletal: No cyanosis, clubbing  Skin: Sacral decub, stage I  Psych: Normal affect and demeanor, alert and oriented x3          Data Reviewed:  I have personally reviewed following labs and imaging studies  Micro Results Recent Results (from the past 240 hour(s))  Blood Culture (routine x 2)     Status: None (Preliminary result)   Collection Time: 10/18/18  6:36 PM   Specimen: BLOOD  Result Value Ref Range Status   Specimen Description   Final    BLOOD LEFT ANTECUBITAL Performed at [facility] [address], [place], [place] [address]    Special Requests   Final    BOTTLES DRAWN AEROBIC AND ANAEROBIC Blood Culture adequate volume Performed at [facility] [address], [place], [place] [address]    Culture   Final    NO GROWTH 2 DAYS Performed at [facility] [address], [place], [place] [address]     Report Status PENDING  Incomplete  SARS Coronavirus 2 (CEPHEID - Performed in [facility] hospital lab), Hosp Order     Status: None   Collection Time: 10/18/18  6:39 PM   Specimen: Nasopharyngeal Swab  Result Value Ref Range Status   SARS Coronavirus 2 NEGATIVE NEGATIVE Final    Comment: (NOTE) If result is NEGATIVE SARS-CoV-2 target nucleic acids are NOT DETECTED. The SARS-CoV-2 RNA is generally detectable in upper and lower  respiratory specimens during the acute phase of infection. The lowest  concentration of SARS-CoV-2 viral copies this assay can detect is 250  copies / mL. A negative result does not preclude SARS-CoV-2 infection  and should not be used as the sole basis for treatment or other  patient management decisions.  A negative result may occur with  improper specimen collection / handling, submission of specimen other  than nasopharyngeal swab, presence of viral mutation(s) within the  areas targeted by this assay, and inadequate number of viral copies  (<250 copies / mL). A negative result must be combined with clinical  observations, patient history, and epidemiological information. If result is POSITIVE SARS-CoV-2 target nucleic acids are DETECTED. The SARS-CoV-2 RNA is generally detectable in upper and lower  respiratory specimens dur ing the acute phase of infection.  Positive  results are indicative of active infection with SARS-CoV-2.  Clinical  correlation with patient history and other diagnostic information is  necessary to determine patient infection status.  Positive results do  not rule out bacterial infection or co-infection with other viruses. If result is PRESUMPTIVE POSTIVE SARS-CoV-2 nucleic acids MAY BE PRESENT.   A presumptive positive result was obtained on the submitted specimen  and confirmed on repeat testing.  While 2019 novel coronavirus  (SARS-CoV-2) nucleic acids may be present in the submitted sample  additional confirmatory testing may be  necessary for epidemiological  and / or clinical management purposes  to differentiate between  SARS-CoV-2 and other Sarbecovirus currently known to infect humans.  If clinically indicated additional testing with an alternate test  methodology [phone]) is advised. The SARS-CoV-2 RNA is generally  detectable in upper and lower respiratory sp ecimens during the acute  phase of infection. The expected result is Negative. Fact Sheet for Patients:  StrictlyIdeas.no Fact Sheet for Healthcare Providers: BankingDealers.co.za This test is not yet approved or cleared by the [place] FDA and has been authorized for detection and/or diagnosis of SARS-CoV-2 by FDA under an Emergency Use Authorization (EUA).  This EUA will remain in effect (meaning this test can be used) for the duration of the COVID-19 declaration under Section 564(b)(1) of the Act, 21 U.S.C. section 360bbb-3(b)(1), unless the authorization is terminated or revoked sooner. Performed at [facility], [place] [address] [place], [place] [address]   Blood Culture (routine x 2)     Status: None (Preliminary result)   Collection Time: 10/18/18  6:41 PM   Specimen: BLOOD  Result Value Ref Range Status   Specimen Description   Final    BLOOD RIGHT ANTECUBITAL Performed at [facility] [address], [place], [place] [address]    Special Requests   Final    BOTTLES DRAWN AEROBIC AND ANAEROBIC Blood Culture adequate volume Performed at [facility] [address] [place], [place] [address]    Culture   Final    NO GROWTH 2 DAYS Performed at [facility] [address], [place], [place] [address]    Report Status PENDING  Incomplete  Urine culture     Status: None   Collection Time: 10/18/18  7:11 PM   Specimen: In/Out Cath Urine  Result Value Ref Range Status   Specimen Description   Final    IN/OUT CATH URINE  Performed at [facility] [address], [place], [place] [address]    Special Requests   Final    NONE Performed at [facility], [place] [address], [place], [place] [address]    Culture   Final    NO GROWTH Performed at [facility], [place] [address], [place], [place] [address]    Report Status 10/20/2018 FINAL  Final  MRSA PCR Screening     Status: Abnormal   Collection Time: 10/19/18  2:30 AM   Specimen: Nasopharyngeal  Result Value Ref Range Status   MRSA by PCR POSITIVE (A) NEGATIVE Final    Comment:        The GeneXpert MRSA Assay (FDA approved for NASAL specimens only), is one component of a comprehensive MRSA colonization surveillance program. It is not intended to diagnose MRSA infection nor to guide or monitor treatment for MRSA infections. RESULT CALLED TO, READ BACK BY AND VERIFIED WITH: C.GRIFFITH RN AT 0909 ON 10/19/2018 BY S.VANHOORNE Performed at [facility], [place] [address] [place], [place] [address]     Radiology Reports Ct Head Wo Contrast  Result Date: 10/18/2018 CLINICAL DATA:  Fall, head trauma EXAM: CT HEAD WITHOUT CONTRAST TECHNIQUE: Contiguous axial images were obtained from the base of the skull through the vertex without intravenous contrast. COMPARISON:  06/30/2016 FINDINGS: Brain: There is atrophy and chronic small vessel disease changes. No acute intracranial abnormality. Specifically, no hemorrhage, hydrocephalus, mass lesion, acute infarction, or significant intracranial injury. Vascular: No hyperdense vessel or unexpected calcification. Skull: No acute calvarial abnormality. Sinuses/Orbits: Visualized paranasal sinuses and mastoids clear. Orbital soft tissues unremarkable. Other: None IMPRESSION: Atrophy, chronic microvascular disease. No acute intracranial abnormality. Electronically Signed  By: Rolm Baptise M.D.   On: 10/18/2018 19:27   Dg Chest Port 1 View  Result Date: 10/18/2018  CLINICAL DATA:  Fever EXAM: PORTABLE CHEST 1 VIEW COMPARISON:  04/08/2018 FINDINGS: Cardiomegaly. Vascular congestion. Diffuse interstitial prominence likely interstitial edema. Confluent airspace disease noted in the left perihilar and lower lung regions concerning for asymmetric edema or pneumonia. Suspect small left effusion. IMPRESSION: Cardiomegaly with vascular congestion and interstitial prominence, likely interstitial edema. Focal left perihilar and lower lobe lobe opacity which could reflect asymmetric edema or infection. Electronically Signed   By: Rolm Baptise M.D.   On: 10/18/2018 19:10   Dg Swallowing Func-speech Pathology  Result Date: 10/19/2018 Objective Swallowing Evaluation: Type of Study: MBS-Modified Barium Swallow Study  Patient Details Name: HOGAN HOOBLER MRN: [id number] Date of Birth: 1927/07/10 Today's Date: 10/19/2018 Time: SLP Start Time (ACUTE ONLY): 1505 -SLP Stop Time (ACUTE ONLY): 1550 SLP Time Calculation (min) (ACUTE ONLY): 45 min Past Medical History: Past Medical History: Diagnosis Date . Arthritis  . Barrett esophagus  . Bladder cancer ([place]) dx'd [phone]  surg only . CHF (congestive heart failure) ([place])  . Colon polyps   adenomatous . Difficulty sleeping   OCCASIONALLY . Diverticulosis of colon (without mention of hemorrhage)  . Frequency of urination  . GERD (gastroesophageal reflux disease)  . Hiatal hernia  . History of bladder cancer 1981  EXCISION ONLY . History of skin cancer   BASAL CELL . Hyperlipidemia  . Lymphoma, non-Hodgkin's ([place]) 2011  [place]  . Macular degeneration   LEFT EYE . Multiple nodules of lung 09/16/2011 . PAF (paroxysmal atrial fibrillation) ([place])  . Personal history of colonic polyps 07/15/2010  tubular adenoma . Prostate cancer [facility])   patient denies . PUD (peptic ulcer disease)  . Stomach cancer ([place])   NHL origin . Stroke ([place])  . Weakness  Past Surgical History: Past Surgical History: Procedure Laterality Date . APPENDECTOMY   . bone spur     left shoulder . CHOLECYSTECTOMY   . KNEE ARTHROPLASTY Left 07/30/2015  Procedure: LEFT TOTAL KNEE ARTHROPLASTY WITH COMPUTER NAVIGATION;  Surgeon: Rod Can, MD;  Location: [facility];  Service: Orthopedics;  Laterality: Left; . LASER OF PROSTATE W/ GREEN LIGHT PVP   . TENDON REPAIR    right arm . TRIGGER FINGER RELEASE    bilateral hands . WRIST SURGERY    Bilateral HPI: 85 yo male admitted from [facility] with AMS, found to have renal failure and possible pna.  Pt has h/o  ???  Parkinsons', dysphagia, pontine cva, renal disease and pna.  He is on a regular/thin diet at his facility.Pt reports he coughs "all the time" when he eats and states its worse with food than drinks.  Subjective: pt awake in chair Assessment / Plan / Recommendation CHL IP CLINICAL IMPRESSIONS 10/19/2018 Clinical Impression Patient presents with significantly worsening dysphagia compared to during his last MBS in 02/2018 resulting in gross residuals and aspiration/penetration.  Secretions also noted that mix with oropharyngeal barium residuals.  Oropharyngeal dysphagia due to his Parkinson's and pontine stroke likely exacerbated by his acute illness.  Oral transiting discoordinated with some lingual pumping and premature spillage/oral residuals.  Pharyngeal swallow initiation was challenging with pt allowing laryngeal penetration for 2 seconds with nectar before pharyngeal swallow even triggered, thus allowing penetration and aspiration.  With mild aspiration of nectar *silent* - cued cough removed aspirates into pharynx which pt expelled into oral cavity per SLP verbal cue before expectorating.  Penetration and aspiration  occurred before, during and after the swallow.  Pt required oral suctioning frequently during MBS due to expectoration of pharyngeal retention.   Motor planning of cued swallows were difficult for pt to perform with initial swallow of puree as pt allowed bolus to be retained in pharynx/vallecular region, thus no pudding was  transited into esophagus initially.  Secondary cued dry swallows to clear liquid pharyngeal retention also ineffective due to nearly absent/poor pharyngeal motility.   Gross residuals present even when swallow timing was adequate due to decreased pharyngeal and laryngeal motility.  With aspiration, pt does not initially sense and when sensation is delayed due to larger aspiration amount, bolus is too deep in trachea for him to clear.  Various postures including chin tuck and head turn were not effective to decrease residuals or prevent aspiration/penetration with solids or liquids via cup/straw.  Decreasing amount to TEASPOOON only of thin and nectar using chin tuck posture were the only boluses pt did not aspirate nor present with gross residuals.  Pt will be functionally unable to receive adequate hydration/nutrition on liquid via tsp diet.  Pt reports he would cough/expectorate even prior to admission -strength of cough/expectoration with ability to mobilize has likely allowed aspiration tolerance.   However given acute on chronic dysphagia, worsening dysphagia, deconditioning, current pna (last one 12/2017) and full code status - SLP highly recommends a palliative referral to establish his goals of care.    Pending GOC, recommend tsps ONLY of nectar, thin with chin tuck posture and multiple swallows.   Will follow up for dysphagia management, education, possibly RMST and will review importance of oral care given pt with copious secretions likely due to his Parkinson's.  Educated pt to findings/recommendations and concerns.  He reported understanding.  SLP Visit Diagnosis Dysphagia, oropharyngeal phase (R13.12) Attention and concentration deficit following -- Frontal lobe and executive function deficit following -- Impact on safety and function Severe aspiration risk   CHL IP TREATMENT RECOMMENDATION 10/19/2018 Treatment Recommendations Therapy as outlined in treatment plan below   Prognosis 10/19/2018 Prognosis for  Safe Diet Advancement Guarded Barriers to Reach Goals Time post onset;Severity of deficits Barriers/Prognosis Comment -- CHL IP DIET RECOMMENDATION 10/19/2018 SLP Diet Recommendations (No Data) Liquid Administration via -- Medication Administration -- Compensations Slow rate;Small sips/bites;Chin tuck Postural Changes --   CHL IP OTHER RECOMMENDATIONS 10/19/2018 Recommended Consults -- Oral Care Recommendations Oral care QID Other Recommendations Order thickener from pharmacy   CHL IP FOLLOW UP RECOMMENDATIONS 10/19/2018 Follow up Recommendations (No Data)   CHL IP FREQUENCY AND DURATION 10/19/2018 Speech Therapy Frequency (ACUTE ONLY) min 2x/week Treatment Duration 2 weeks      CHL IP ORAL PHASE 10/19/2018 Oral Phase Impaired Oral - Pudding Teaspoon -- Oral - Pudding Cup -- Oral - Honey Teaspoon -- Oral - Honey Cup -- Oral - Nectar Teaspoon Weak lingual manipulation;Lingual pumping;Premature spillage Oral - Nectar Cup Reduced posterior propulsion Oral - Nectar Straw Weak lingual manipulation;Reduced posterior propulsion Oral - Thin Teaspoon Weak lingual manipulation;Lingual/palatal residue;Reduced posterior propulsion;Premature spillage Oral - Thin Cup Lingual/palatal residue;Reduced posterior propulsion;Premature spillage Oral - Thin Straw Lingual/palatal residue;Weak lingual manipulation;Reduced posterior propulsion;Premature spillage Oral - Puree Weak lingual manipulation;Reduced posterior propulsion;Lingual/palatal residue Oral - Mech Soft Reduced posterior propulsion;Weak lingual manipulation;Impaired mastication;Premature spillage;Lingual/palatal residue Oral - Regular -- Oral - Multi-Consistency -- Oral - Pill -- Oral Phase - Comment --  CHL IP PHARYNGEAL PHASE 10/19/2018 Pharyngeal Phase Impaired Pharyngeal- Pudding Teaspoon -- Pharyngeal -- Pharyngeal- Pudding Cup -- Pharyngeal -- Pharyngeal- Honey Teaspoon --  Pharyngeal -- Pharyngeal- Honey Cup -- Pharyngeal -- Pharyngeal- Nectar Teaspoon Delayed swallow  initiation-vallecula;Reduced anterior laryngeal mobility;Reduced laryngeal elevation;Reduced airway/laryngeal closure;Reduced tongue base retraction;Penetration/Aspiration during swallow;Penetration/Apiration after swallow;Pharyngeal residue - valleculae Pharyngeal Material enters airway, passes BELOW cords then ejected out;Material enters airway, passes BELOW cords without attempt by patient to eject out (silent aspiration) Pharyngeal- Nectar Cup Delayed swallow initiation-vallecula;Reduced epiglottic inversion;Reduced laryngeal elevation;Reduced airway/laryngeal closure;Reduced tongue base retraction;Pharyngeal residue - valleculae;Compensatory strategies attempted (with notebox) Pharyngeal -- Pharyngeal- Nectar Straw Reduced epiglottic inversion;Reduced laryngeal elevation;Reduced airway/laryngeal closure;Reduced tongue base retraction;Penetration/Aspiration during swallow;Penetration/Apiration after swallow;Moderate aspiration;Pharyngeal residue - valleculae;Compensatory strategies attempted (with notebox) Pharyngeal Material enters airway, passes BELOW cords and not ejected out despite cough attempt by patient;Material enters airway, passes BELOW cords without attempt by patient to eject out (silent aspiration) Pharyngeal- Thin Teaspoon Delayed swallow initiation-vallecula;Reduced epiglottic inversion;Reduced anterior laryngeal mobility;Reduced laryngeal elevation;Reduced airway/laryngeal closure;Reduced tongue base retraction;Pharyngeal residue - valleculae;Compensatory strategies attempted (with notebox) Pharyngeal -- Pharyngeal- Thin Cup Delayed swallow initiation-vallecula;Reduced epiglottic inversion;Reduced laryngeal elevation;Reduced airway/laryngeal closure;Reduced tongue base retraction;Penetration/Aspiration before swallow;Penetration/Apiration after swallow;Pharyngeal residue - valleculae Pharyngeal Material enters airway, passes BELOW cords without attempt by patient to eject out (silent aspiration)  Pharyngeal- Thin Straw Delayed swallow initiation-vallecula;Reduced epiglottic inversion;Reduced anterior laryngeal mobility;Reduced laryngeal elevation;Reduced airway/laryngeal closure;Reduced tongue base retraction;Penetration/Aspiration before swallow;Penetration/Apiration after swallow;Pharyngeal residue - valleculae Pharyngeal Material enters airway, passes BELOW cords and not ejected out despite cough attempt by patient;Material enters airway, passes BELOW cords without attempt by patient to eject out (silent aspiration) Pharyngeal- Puree Delayed swallow initiation-vallecula;Reduced pharyngeal peristalsis;Reduced epiglottic inversion;Reduced anterior laryngeal mobility;Reduced laryngeal elevation;Reduced airway/laryngeal closure;Reduced tongue base retraction;Pharyngeal residue - valleculae Pharyngeal -- Pharyngeal- Mechanical Soft Delayed swallow initiation-vallecula;Reduced pharyngeal peristalsis;Reduced epiglottic inversion;Reduced anterior laryngeal mobility;Reduced laryngeal elevation;Reduced airway/laryngeal closure;Reduced tongue base retraction;Pharyngeal residue - valleculae Pharyngeal -- Pharyngeal- Regular -- Pharyngeal -- Pharyngeal- Multi-consistency -- Pharyngeal -- Pharyngeal- Pill -- Pharyngeal -- Pharyngeal Comment weak initial swallow with pudding that resulted in 0% transiting into esophagus, 2nd swallow effective to clear approx 50%, chin tuck helpful with tsp amounts to keep pt from aspirating as all of the bolus is retained in the pharynx before swallow triggered, larger amounts were aspirated, pt required cues to cough/"hock" to expectorate barium throughout the study  CHL IP CERVICAL ESOPHAGEAL PHASE 10/19/2018 Cervical Esophageal Phase Impaired Pudding Teaspoon -- Pudding Cup -- Honey Teaspoon -- Honey Cup -- Nectar Teaspoon -- Nectar Cup -- Nectar Straw -- Thin Teaspoon -- Thin Cup -- Thin Straw -- Puree -- Mechanical Soft -- Regular -- Multi-consistency -- Pill -- Cervical Esophageal  Comment appearance of prominent cricopharyngeus did not impair barium flow Luanna Salk, MS [facility] SLP Acute Rehab Services Pager [phone] Office [phone] Macario Golds 10/19/2018, 5:45 PM               Lab Data:  CBC: Recent Labs  Lab 10/18/18 1905 10/19/18 0611 10/20/18 0559  WBC 9.9 7.1 11.3*  NEUTROABS 8.3*  --   --   HGB 13.9 12.5* 12.8*  HCT 47.3 41.0 43.2  MCV 90.4 89.9 90.8  PLT 204 152 657*   Basic Metabolic Panel: Recent Labs  Lab 10/18/18 1905 10/19/18 0611 10/20/18 0559  NA 138 138 139  K 4.8 4.3 4.5  CL 103 109 110  CO2 25 21* 19*  GLUCOSE 116* 94 76  BUN 24* 23 24*  CREATININE 1.87* 1.53* 1.39*  CALCIUM 8.6* 8.0* 8.0*  MG  --  1.8  --   PHOS  --  3.1  --  GFR: Estimated Creatinine Clearance: 39.1 mL/min (A) (by C-G formula based on SCr of 1.39 mg/dL (H)). Liver Function Tests: Recent Labs  Lab 10/18/18 1905 10/19/18 0611  AST 19 17  ALT 9 9  ALKPHOS 42 32*  BILITOT 0.5 0.5  PROT 7.8 6.4*  ALBUMIN 2.8* 2.3*   No results for input(s): LIPASE, AMYLASE in the last 168 hours. No results for input(s): AMMONIA in the last 168 hours. Coagulation Profile: Recent Labs  Lab 10/18/18 1905  INR 1.6*   Cardiac Enzymes: No results for input(s): CKTOTAL, CKMB, CKMBINDEX, TROPONINI in the last 168 hours. BNP (last 3 results) No results for input(s): PROBNP in the last 8760 hours. HbA1C: No results for input(s): HGBA1C in the last 72 hours. CBG: No results for input(s): GLUCAP in the last 168 hours. Lipid Profile: No results for input(s): CHOL, HDL, LDLCALC, TRIG, CHOLHDL, LDLDIRECT in the last 72 hours. Thyroid Function Tests: Recent Labs    10/19/18 0611  TSH 2.038   Anemia Panel: No results for input(s): VITAMINB12, FOLATE, FERRITIN, TIBC, IRON, RETICCTPCT in the last 72 hours. Urine analysis:    Component Value Date/Time   COLORURINE YELLOW 10/18/2018 1911   APPEARANCEUR HAZY (A) 10/18/2018 1911   LABSPEC 1.015 10/18/2018  1911   PHURINE 5.0 10/18/2018 1911   GLUCOSEU NEGATIVE 10/18/2018 1911   HGBUR NEGATIVE 10/18/2018 1911   BILIRUBINUR NEGATIVE 10/18/2018 1911   KETONESUR NEGATIVE 10/18/2018 1911   PROTEINUR NEGATIVE 10/18/2018 1911   UROBILINOGEN 1.0 07/07/2011 1942   NITRITE NEGATIVE 10/18/2018 1911   LEUKOCYTESUR NEGATIVE 10/18/2018 1911     Penn Grissett M.D. [facility] 10/20/2018, 1:50 PM  Pager: [phone] Between 7am to 7pm - call Pager - [phone]  After 7pm go to [email or web address] - password [email or web address]  Call night coverage person covering after 7pm

## 2018-10-20 NOTE — Progress Notes (Signed)
Palliative Brief Note  I met today with Mr. Frieden.  He asked me to call and discuss with his daughter.  Part of this conversation was the fact that in the past they have discussed and expressed wishes that he be DNR in the event of cardiac or respiratory arrest.  I explained that he is currently FULL CODE and his daughter reports that this was not discussed with her and her father has expressed desire for natural death in the past.  We talked about rationale for this and his daughter requested this be changed per his and families previously decided wishes.    I then met again with Mr. Moster and discussed this with him again.  He stated that he would not want to be placed on life support nor have heroic interventions in the event of a natural death.   I changed CODE STATUS to DO NOT RESUSCITATE and notified Dr. Tana Coast of this change.  - DNR/DNI - Overall goal is to get back to [name].  Family would be agreeable to hospice if he qualifies.  Daughter reports that she has been told before that he was not a candidate at that time. - Continue current interventions, will ask SLP to see again. - Full note to follow.  Micheline Rough, MD [place] Team [phone]

## 2018-10-20 NOTE — Progress Notes (Signed)
Pt is having a lot of coughing with dys 1 diet and nectar liquids.  Reminded to tuck chin and swallow. He states "it helps". Coughing clear phlegm. Sometimes colored with liquids he has taken. Has suction readily available. Eulas Post, RN

## 2018-10-20 NOTE — Consult Note (Signed)
Consultation Note Date: 10/20/2018   Patient Name: Jason [name]  DOB: [birth year]  MRN: [id number]  Age / Sex: 85 y.o., male  PCP: [name], MD Referring Physician: Mendel Corning, MD  Reason for Consultation: Establishing goals of care  HPI/Patient Profile: 85 y.o. male  with past medical history of chronic diastolic heart failure, A. fib on Eliquis, CKD, non-Hodgkin's lymphoma, Parkinson's, aspiration pneumonia, stroke, bladder cancer, GERD, and prostate cancer admitted on 10/18/2018 with Sirs related to community-acquired pneumonia with high concern for aspiration, dysphasia, and acute renal failure.  Palliative consulted for goals of care.   Clinical Assessment and Goals of Care: I met today with Jason [name].  He was sitting in bed in no distress during our encounter.  He was attentive during discussion, however, he was slow to respond to answer some questions.  I am not sure if this is related more to flat affect with Parkinsons or if he does not possess full insight into the severity of his conditions based on conversation today.  I talked with him about his past medical history and clinical course over the past several months as well as this admission.  At the end of the conversation, he stated that his daughter helps him to make medical decisions and he would appreciate if I will call her to discuss.  I called and was able to reach his daughter, [name].  [name] reports that the family has continued to see decline in Jason [name] nutrition, cognition, and functional status.  She relates that they had met with provider from palliative medicine team last fall and at that point in time it was decided for DNR and family had understanding that he is going to continue to aspirate moving forward.  She stated to me that she is actually surprised he is gone so long without needing to come back to the  hospital and he has been very pleased with his time at [facility].  Prior to moving to [facility], he was at [facility] and she states he did not do well in the busier rehab environment.  He greatly prefers [name] and they are hopeful he can transition back there.  [name] states understanding that her father is going to continue to aspirate moving forward.  Overall the primary goal of his family would be to work to get him back to [facility] as this is where he has been most happy.  She states he needs to be back on some type of regular diet in order to go there and they understand that this is going to lead to future aspiration events.  [name] reports that he has been followed by palliative care there as an outpatient and there has been discussion of hospice, however, up to this point it was determined by outpatient palliative care team that he was not yet hospice eligibile.  She reports that family would be agreeable to hospice enrollment if believed appropriate at this time.  I also discussed with daughter regarding patient's CODE STATUS.  She states  that in the past he has said that he would not want aggressive interventions in the event of a natural death.  She thought this had been taken care of when he was transitioned to be a DNR during prior admission.  She requested that we reinstate his DNR status.  Family has also discussed with him regarding feeding tube and this is clearly something he would never want.  After talking with [name], I went back agreement with Jason [name].  I reviewed my conversation with [name] regarding desire to be back at [facility], understanding that aspiration will continue to be a problem moving forward, and plan for continuation of current therapy but no heroic interventions in the event of cardiac or respiratory arrest.  Jason [name] confirmed that he would not want to be placed on life support, not have a feeding tube, nor have heroic interventions in the event of a  natural death.  SUMMARY OF RECOMMENDATIONS   - DNR/DNI - Continue current interventions, including continuation of current antibiotics.  Plan would be to treat this pneumonia, but with understanding that he will aspirate again at some point in the future.  Consideration for hospice moving forward depending on his clinical course once we work to restart PO intake. - Overall goal is to get back to [name].  Family would be agreeable to hospice if he qualifies.  Daughter reports that she has been told before that he was not a candidate at that time.  I believe with recurrent aspiration that has now worsened that he is likely hospice appropriate at this point. - Will ask SLP to see again but with goal of working to restart diet with understanding that aspiration will still occur.  Family/patient very clear about no feeding tube/PEG.  Code Status/Advance Care Planning:  DNR  Palliative Prophylaxis:   Aspiration  Additional Recommendations (Limitations, Scope, Preferences):  Avoid Hospitalization  Psycho-social/Spiritual:   Desire for further Chaplaincy support:no  Additional Recommendations: Caregiving  Support/Resources and Education on Hospice  Prognosis:   < 6 months-this will largely depend upon how he does with reinitiation of diet.  He remains at high risk for recurrent aspiration as demonstrated by worsened MBS this admission.  While it has been 10 months since last admission for aspiration, this is something that progresses in severity and frequency.  If he continues to aspirate with reinitiation of diet, I believe that there is a high likelihood we could be in the last 6 months of life if his disease follows its natural course and I would recommend election of his hospice benefits if possible.  Discharge Planning: Hopefully back to [name] with hospice      Primary Diagnoses: Present on Admission: . Malignant neoplasm of bladder ([place]) . Hyperlipidemia . NHL  (non-Hodgkin's lymphoma) ([place]) . GERD (gastroesophageal reflux disease) . BPH (benign prostatic hyperplasia) . CAP (community acquired pneumonia) . Paroxysmal atrial fibrillation (HCC) . Acute renal failure superimposed on stage 3 chronic kidney disease ([place]) . Parkinson's disease ([place]) . Chronic diastolic (congestive) heart failure ([place]) . Diarrhea . Dehydration . SIRS (systemic inflammatory response syndrome) (HCC) . Decubitus ulcer of sacral region, stage 1   I have reviewed the medical record, interviewed the patient and family, and examined the patient. The following aspects are pertinent.  Past Medical History:  Diagnosis Date  . Arthritis   . Barrett esophagus   . Bladder cancer ([place]) dx'd 4270,6237   surg only  . CHF (congestive heart failure) ([place])   . Colon polyps  adenomatous  . Difficulty sleeping    OCCASIONALLY  . Diverticulosis of colon (without mention of hemorrhage)   . Frequency of urination   . GERD (gastroesophageal reflux disease)   . Hiatal hernia   . History of bladder cancer 1981   EXCISION ONLY  . History of skin cancer    BASAL CELL  . Hyperlipidemia   . Lymphoma, non-Hodgkin's ([place]) 2011   [place]   . Macular degeneration    LEFT EYE  . Multiple nodules of lung 09/16/2011  . PAF (paroxysmal atrial fibrillation) ([place])   . Personal history of colonic polyps 07/15/2010   tubular adenoma  . Prostate cancer [facility])    patient denies  . PUD (peptic ulcer disease)   . Stomach cancer ([place])    NHL origin  . Stroke ([place])   . Weakness    Social History   Socioeconomic History  . Marital status: Widowed    Spouse name: Not on file  . Number of children: 2  . Years of education: HS  . Highest education level: Not on file  Occupational History  . Occupation: retired    Fish farm manager: RETIRED  Social Needs  . Financial resource strain: Not on file  . Food insecurity    Worry: Not on file    Inability: Not on file  . Transportation needs     Medical: Not on file    Non-medical: Not on file  Tobacco Use  . Smoking status: Former Smoker    Quit date: 10/29/1966    Years since quitting: 52.0  . Smokeless tobacco: Former Systems developer    Quit date: 06/29/1998  Substance and Sexual Activity  . Alcohol use: Yes    Comment: Socially  . Drug use: No  . Sexual activity: Not Currently    Birth control/protection: None  Lifestyle  . Physical activity    Days per week: Not on file    Minutes per session: Not on file  . Stress: Not on file  Relationships  . Social Herbalist on phone: Not on file    Gets together: Not on file    Attends religious service: Not on file    Active member of club or organization: Not on file    Attends meetings of clubs or organizations: Not on file    Relationship status: Not on file  Other Topics Concern  . Not on file  Social History Narrative   Lives at home alone.   Right-handed.   Occasional use of caffeine.   Family History  Problem Relation Age of Onset  . Heart attack Father   . Other Mother        "old age"   Scheduled Meds: . acetaminophen  650 mg Oral Once  . carbidopa-levodopa  1 tablet Oral 3 times per day  . enoxaparin (LOVENOX) injection  1 mg/kg Subcutaneous Q12H  . pantoprazole  40 mg Oral Q0600  . simvastatin  20 mg Oral QPM  . tamsulosin  0.4 mg Oral QHS  . vancomycin  125 mg Oral QID   Continuous Infusions: . ceFEPime (MAXIPIME) IV 2 g (10/20/18 1953)  . metronidazole 500 mg (10/20/18 1701)  . vancomycin 1,000 mg (10/20/18 0611)   PRN Meds:.acetaminophen **OR** acetaminophen, HYDROcodone-acetaminophen, ondansetron **OR** ondansetron (ZOFRAN) IV, Resource ThickenUp Clear Medications Prior to Admission:  Prior to Admission medications   Medication Sig Start Date End Date Taking? Authorizing Provider  apixaban (ELIQUIS) 5 MG TABS tablet Take 5 mg  by mouth 2 (two) times daily.   Yes [provider]  calcium carbonate (TUMS - DOSED IN MG ELEMENTAL  CALCIUM) 500 MG chewable tablet Chew 2 tablets by mouth every 8 (eight) hours.   Yes [provider]  carbidopa-levodopa (SINEMET IR) 25-100 MG tablet Take 1 tablet by mouth 3 (three) times daily. Take at 8am, 12noon, 5pm 05/14/18  Yes Marcial Pacas, MD  Cholecalciferol (EQL VITAMIN D3) 25 MCG (1000 UT) tablet Take 2,000 Units by mouth daily. Take 2 tabs (2000 units total) once a day   Yes [provider]  Docusate Sodium (COLACE PO) Take 100 mg by mouth at bedtime.    Yes [provider]  esomeprazole (NEXIUM) 40 MG capsule TAKE 1 CAPSULE (40 MG TOTAL) BY MOUTH 2 (TWO) TIMES DAILY BEFORE A MEAL. 03/17/16  Yes Irene Shipper, MD  ferrous sulfate 325 (65 FE) MG tablet Take 325 mg by mouth daily with breakfast.   Yes [provider]  fluticasone (FLONASE) 50 MCG/ACT nasal spray Place 2 sprays into both nostrils daily.   Yes [provider]  furosemide (LASIX) 40 MG tablet Take 40 mg by mouth daily.    Yes [provider]  magnesium oxide (MAG-OX) 400 MG tablet Take 400 mg by mouth daily.    Yes [provider]  Melatonin 3 MG TABS Take 3 mg by mouth at bedtime.   Yes [provider]  memantine (NAMENDA) 10 MG tablet Take 1 tablet (10 mg total) by mouth 2 (two) times daily. 05/14/18  Yes Marcial Pacas, MD  metoprolol tartrate (LOPRESSOR) 25 MG tablet Take 25 mg by mouth 2 (two) times daily.   Yes [provider]  potassium chloride SA (K-DUR,KLOR-CON) 20 MEQ tablet Take 2 tablets (40 mEq total) by mouth daily. Patient taking differently: Take 20 mEq by mouth 2 (two) times daily. Take with dose of Lasix 12/27/17  Yes Domenic Polite, MD  simvastatin (ZOCOR) 20 MG tablet Take 20 mg by mouth every evening.    Yes [provider]  Specialty Vitamins Products (ICAPS LUTEIN-ZEAXANTHIN PO) Take 1 tablet by mouth 2 (two) times daily.   Yes [provider]  tamsulosin (FLOMAX) 0.4 MG CAPS capsule Take 0.4 mg by mouth at  bedtime.   Yes [provider]  traMADol (ULTRAM) 50 MG tablet Take 1 tablet (50 mg total) by mouth every 8 (eight) hours as needed for moderate pain. Patient not taking: Reported on 10/18/2018 12/26/17   Domenic Polite, MD   No Known Allergies Review of Systems  Constitutional: Positive for activity change and fatigue.  HENT: Positive for congestion and trouble swallowing.   Respiratory: Positive for cough and choking.   Neurological: Positive for tremors and weakness.  Psychiatric/Behavioral: Positive for sleep disturbance.   Physical Exam General: Alert, awake, in no acute distress. Slow to answer some questions. Heart: Regular rate and rhythm. No murmur appreciated. Lungs: Good air movement, clear Abdomen: Soft, nontender, nondistended, positive bowel sounds.  Ext: No significant edema Skin: Warm and dry Neuro: Grossly intact, nonfocal.   Vital Signs: BP 132/78 (BP Location: Left Arm)   Pulse 82   Temp (!) 97.4 F (36.3 C) (Oral)   Resp 20   Ht '5\' 11"'  (1.803 m)   Wt 85.2 kg   SpO2 99%   BMI 26.20 kg/m  Pain Scale: 0-10   Pain Score: 0-No pain   SpO2: SpO2: 99 % O2 Device:SpO2: 99 % O2 Flow Rate: .O2 Flow Rate (L/min):  0 L/min  IO: Intake/output summary:   Intake/Output Summary (Last 24 hours) at 10/20/2018 2021 Last data filed at 10/20/2018 1844 Gross per 24 hour  Intake 736.79 ml  Output 200 ml  Net 536.79 ml    LBM: Last BM Date: 10/20/18 Baseline Weight: Weight: 85.2 kg Most recent weight: Weight: 85.2 kg     Palliative Assessment/Data:   Flowsheet Rows     Most Recent Value  Intake Tab  Referral Department  Hospitalist  Unit at Time of Referral  Med/Surg Unit  Palliative Care Primary Diagnosis  Sepsis/Infectious Disease  Date Notified  10/19/18  Palliative Care Type  Return patient Palliative Care  Reason for referral  Clarify Goals of Care  Date of Admission  10/18/18  Date first seen by Palliative Care  10/20/18  # of days  Palliative referral response time  1 Day(s)  # of days IP prior to Palliative referral  1  Clinical Assessment  Palliative Performance Scale Score  60%  Psychosocial & Spiritual Assessment  Palliative Care Outcomes  Patient/Family meeting held?  Yes  Who was at the meeting?  daughter via phone  Palliative Care Outcomes  Clarified goals of care, Changed CPR status, Completed durable DNR      Time In: 1220 Time Out: 1340 Time Total: 80 Greater than 50%  of this time was spent counseling and coordinating care related to the above assessment and plan.  Signed by: Micheline Rough, MD   Please contact Palliative Medicine Team phone at [phone] for questions and concerns.  For individual provider: See Shea Evans

## 2018-10-20 NOTE — Progress Notes (Signed)
CRITICAL VALUE ALERT  Critical Value:  C-Diff positive  Date & Time Notied:  10/20/2018   1948  Provider Notified: Alger Memos  Orders Received/Actions taken: waiting any new orders

## 2018-10-21 ENCOUNTER — Encounter (HOSPITAL_COMMUNITY): Payer: Self-pay | Admitting: *Deleted

## 2018-10-21 LAB — BASIC METABOLIC PANEL
Anion gap: 8 (ref 5–15)
BUN: 27 mg/dL — ABNORMAL HIGH (ref 8–23)
CO2: 22 mmol/L (ref 22–32)
Calcium: 8.3 mg/dL — ABNORMAL LOW (ref 8.9–10.3)
Chloride: 109 mmol/L (ref 98–111)
Creatinine, Ser: 1.69 mg/dL — ABNORMAL HIGH (ref 0.61–1.24)
GFR calc Af Amer: 41 mL/min — ABNORMAL LOW (ref >60)
GFR calc non Af Amer: 35 mL/min — ABNORMAL LOW (ref >60)
Glucose, Bld: 91 mg/dL (ref 70–99)
Potassium: 3.8 mmol/L (ref 3.5–5.1)
Sodium: 139 mmol/L (ref 135–145)

## 2018-10-21 LAB — GASTROINTESTINAL PANEL BY PCR, STOOL (REPLACES STOOL CULTURE)

## 2018-10-21 LAB — CBC
HCT: 41.6 % (ref 39.0–52.0)
Hemoglobin: 12.7 g/dL — ABNORMAL LOW (ref 13.0–17.0)
MCH: 27.1 pg (ref 26.0–34.0)
MCHC: 30.5 g/dL (ref 30.0–36.0)
MCV: 88.9 fL (ref 80.0–100.0)
Platelets: 154 10*3/uL (ref 150–400)
RBC: 4.68 MIL/uL (ref 4.22–5.81)
RDW: 14.6 % (ref 11.5–15.5)
WBC: 7.7 10*3/uL (ref 4.0–10.5)
nRBC: 0 % (ref 0.0–0.2)

## 2018-10-21 MED ORDER — METOPROLOL TARTRATE 25 MG PO TABS
25.0000 mg | ORAL_TABLET | Freq: Two times a day (BID) | ORAL | Status: DC
  Administered 2018-10-21 – 2018-10-23 (×5): 25 mg via ORAL
  Filled 2018-10-21 (×5): qty 1

## 2018-10-21 MED ORDER — FLUTICASONE PROPIONATE 50 MCG/ACT NA SUSP
2.0000 | Freq: Every day | NASAL | Status: DC
  Administered 2018-10-21 – 2018-10-24 (×3): 2 via NASAL
  Filled 2018-10-21: qty 16

## 2018-10-21 MED ORDER — MUPIROCIN 2 % EX OINT
1.0000 "application " | TOPICAL_OINTMENT | Freq: Two times a day (BID) | CUTANEOUS | Status: DC
  Administered 2018-10-21 – 2018-10-24 (×7): 1 via NASAL
  Filled 2018-10-21 (×2): qty 22

## 2018-10-21 MED ORDER — MEMANTINE HCL 10 MG PO TABS
10.0000 mg | ORAL_TABLET | Freq: Two times a day (BID) | ORAL | Status: DC
  Administered 2018-10-21 – 2018-10-24 (×7): 10 mg via ORAL
  Filled 2018-10-21 (×7): qty 1

## 2018-10-21 MED ORDER — CHLORHEXIDINE GLUCONATE CLOTH 2 % EX PADS
6.0000 | MEDICATED_PAD | Freq: Every day | CUTANEOUS | Status: DC
  Administered 2018-10-21 – 2018-10-24 (×4): 6 via TOPICAL

## 2018-10-21 NOTE — TOC Initial Note (Signed)
Transition of Care [facility]) - Initial/Assessment Note    Patient Details  Name: Jason [name] MRN: [id number] Date of Birth: 16-Sep-1927  Transition of Care [facility]) CM/SW Contact:    Nila Nephew, LCSW Phone Number: [phone] weekend coverage 10/21/2018, 2:40 PM  Clinical Narrative:   Pt admitted with aspiration pneumonia from [facility] assisted living. Reached out to ALF today to begin DC planning, however facility reception states that representative not available to discuss today on weekend, advised call Monday and speak with Ebony Hail. Pt with poor prognosis due to concern for continued aspiration per chart. Pt/family open to hospice if qualifies, pt's main goal is to return to [facility] if clinically appropriate. (Pt's diet and functional status will need to be reviewed by ALF)                Expected Discharge Plan and Services        TBD                      Prior Living Arrangements/Services             Assisted living- will inquire what services pt was receiving           Activities of Daily Living Home Assistive Devices/Equipment: Gilford Rile (specify type) ADL Screening (condition at time of admission) Patient's cognitive ability adequate to safely complete daily activities?: Yes Is the patient deaf or have difficulty hearing?: Yes Does the patient have difficulty seeing, even when wearing glasses/contacts?: Yes Does the patient have difficulty concentrating, remembering, or making decisions?: No Patient able to express need for assistance with ADLs?: Yes Does the patient have difficulty dressing or bathing?: Yes Independently performs ADLs?: No Communication: Independent Dressing (OT): Needs assistance Is this a change from baseline?: Pre-admission baseline Grooming: Needs assistance Is this a change from baseline?: Pre-admission baseline Feeding: Independent Bathing: Needs assistance Is this a change from baseline?: Pre-admission baseline Toileting: Needs  assistance Is this a change from baseline?: Pre-admission baseline In/Out Bed: Needs assistance Is this a change from baseline?: Pre-admission baseline Walks in Home: Independent with device (comment) Does the patient have difficulty walking or climbing stairs?: Yes Weakness of Legs: Both Weakness of Arms/Hands: Both   Admission diagnosis:  Fever, unspecified fever cause [R50.9] Community acquired pneumonia, unspecified laterality [J18.9] CAP (community acquired pneumonia) [J18.9] Patient Active Problem List   Diagnosis Date Noted  . Dehydration 10/18/2018  . SIRS (systemic inflammatory response syndrome) ([place]) 10/18/2018  . Decubitus ulcer of sacral region, stage 1 10/18/2018  . Memory loss 05/14/2018  . Parkinsonism ([place]) 05/14/2018  . History of stroke 05/04/2018  . Dilated aortic root ([place]) 05/04/2018  . Palliative care by specialist   . Goals of care, counseling/discussion   . HCAP (healthcare-associated pneumonia) 12/22/2017  . Sepsis ([place]) 12/22/2017  . Chronic diastolic (congestive) heart failure ([place]) 12/22/2017  . Aspiration pneumonia ([place]) 12/22/2017  . Diarrhea 12/22/2017  . Fall at home 10/19/2016  . Gait abnormality 09/05/2016  . Parkinson's disease ([place]) 09/05/2016  . CVA (cerebral vascular accident) ([place]) 07/01/2016  . Dysarthria   . History of cancer   . TIA (transient ischemic attack) 06/30/2016  . Hemiplegia affecting dominant side ([place]) 06/30/2016  . History of bladder cancer   . Dysphagia   . Hyperglycemia   . Acute renal failure superimposed on stage 3 chronic kidney disease ([place])   . Acute blood loss anemia   . Leukocytosis   . Weakness   . Community acquired pneumonia 06/16/2016  .  Unresponsiveness   . CAP (community acquired pneumonia) 06/15/2016  . Paroxysmal atrial fibrillation ([place]) 06/15/2016  . Pressure injury of skin 06/15/2016  . Lobar pneumonia ([place])   . Acute congestive heart failure ([place])   . Primary osteoarthritis of left knee  07/30/2015  . BPH (benign prostatic hyperplasia) 07/16/2012  . Multiple nodules of lung 09/16/2011  . GERD (gastroesophageal reflux disease) 06/14/2011  . Constipation, chronic 06/14/2011  . NHL (non-Hodgkin's lymphoma) ([place]) 01/03/2010  . Malignant neoplasm of bladder ([place]) 05/28/2007  . Hyperlipidemia 05/28/2007  . HIATAL HERNIA 05/28/2007  . BARRETT'S ESOPHAGUS, HX OF 05/28/2007   PCP:  [name], MD Pharmacy:  No Pharmacies Listed    Social Determinants of Health (SDOH) Interventions    Readmission Risk Interventions No flowsheet data found.

## 2018-10-21 NOTE — Progress Notes (Signed)
[facility]                                                                              Patient Demographics  Jason [name], is a 85 y.o. male, DOB - 11-Apr-1927, [id number]  Admit date - 10/18/2018   Admitting Physician Toy Baker, MD  Outpatient Primary MD for the patient is [name], MD  Outpatient specialists:   LOS - 2  days   Medical records reviewed and are as summarized below:    Chief Complaint  Patient presents with   Fatigue   Fever       Brief summary   Patient is a 85 year old male with history of chronic diastolic CHF, paroxysmal A. fib on Eliquis, CKD stage III, non-Hodgkin lymphoma, Parkinson's disease, history of aspiration pneumonia, stroke, bladder cancer, GERD, prostate CA, peptic ulcer disease presented with mechanical fall.  Per admission history, patient was rushing to get to the bathroom due to diarrhea however was so weak and hence fell.  He could not get up due to weakness, denied any injuries.  Patient also reported fevers. COVID-19 test negative   Assessment & Plan    Principal Problem:   Acute renal failure superimposed on stage 3 chronic kidney disease (HCC) -Baseline creatinine 1.5, presented with creatinine of 1.8, due to dehydration and diarrhea. -Patient was placed on gentle hydration creatinine improved to 1.3-> 1.6 still around baseline  Active Problems: Sirs with community-acquired pneumonia, likely aspiration pneumonia, dysphagia --Patient was placed on IV vancomycin, cefepime and Flagyl. -Given new diagnosis of C. difficile, changed to oral Augmentin for total 7 days of antibiotics -Per daughter, he has longtime swallowing issues.  Requested oral diet for his SNF to take him back -Continue dysphagia 1 diet with nectar thick liquids, speech therapy to follow  C. difficile diarrhea -Patient presented with diarrhea at the time of admission, C. difficile PCR positive -Placed on oral vancomycin.   Given patient is on antibiotics for aspiration pneumonia, will increase duration of oral vancomycin to complete a course of 10 days of p.o. vanc after the Augmentin course is complete till 11/02/2018   paroxysmal atrial fibrillation -Continue Eliquis, [place] VASC score at least 5, with prior history of stroke -Restart metoprolol  Chronic diastolic CHF -2D echo 2/77 showed EF of 55 to 60% with grade 1 diastolic dysfunction -Currently euvolemic, was somewhat dehydrated at the time of admission with acute kidney injury, diarrhea -BP improving, will restart metoprolol. Continue to hold Lasix due to poor p.o. intake, diarrhea.    Malignant neoplasm of bladder ([place]), history of non-Hodgkin lymphoma -Remote history of bladder cancer with excision only -Non-Hodgkin lymphoma status post chemo and radiation therapy, not on chemo currently, had followed with Dr Beryle Beams -Palliative medicine had been following outpatient    Hyperlipidemia -Continue statin    GERD (gastroesophageal reflux disease) -Restart PPI    BPH (benign prostatic hyperplasia) -Continue tamsulosin    Parkinson's disease ([place]), likely worsening, has dysphagia, dementia - Continue Sinemet -Palliative medicine consulted for goals of care   History of stroke -Continue Eliquis    Decubitus ulcer of sacral region, stage 1 -Wound care consulted  Generalized debility, poor functional status, dysphagia, dementia -Palliative medicine consulted, goals of care addressed, DNR, daughter requested patient at least on solid diet.  Code Status: Full code DVT Prophylaxis: Received therapeutic Lovenox as patient is currently having dysphagia issues Family Communication: Discussed in detail with the patient, all imaging results, lab results explained to the patient's daughter.  She would like her father to return back to the facility as soon as possible.  Per daughter, he is currently in a private room.  Will request social work to call  the facility.   Disposition Plan: Unclear given now C. difficile positive  Time Spent in minutes   25 minutes  Procedures:  None  Consultants:   Palliative medicine   Antimicrobials:   Anti-infectives (From admission, onward)   Start     Dose/Rate Route Frequency Ordered Stop   10/21/18 1000  amoxicillin-clavulanate (AUGMENTIN) 875-125 MG per tablet 1 tablet     1 tablet Oral Every 12 hours 10/20/18 2046 10/23/18 2159   10/20/18 2200  vancomycin (VANCOCIN) 1,750 mg in sodium chloride 0.9 % 500 mL IVPB  Status:  Discontinued     1,750 mg 250 mL/hr over 120 Minutes Intravenous Every 48 hours 10/19/18 0211 10/19/18 1630   10/20/18 2200  vancomycin (VANCOCIN) 50 mg/mL oral solution 125 mg     125 mg Oral 4 times daily 10/20/18 1951 11/02/18 2359   10/20/18 0600  vancomycin (VANCOCIN) IVPB 1000 mg/200 mL premix  Status:  Discontinued     1,000 mg 200 mL/hr over 60 Minutes Intravenous Every 24 hours 10/19/18 1630 10/20/18 2043   10/19/18 0800  ceFEPIme (MAXIPIME) 2 g in sodium chloride 0.9 % 100 mL IVPB  Status:  Discontinued     2 g 200 mL/hr over 30 Minutes Intravenous Every 12 hours 10/19/18 0210 10/20/18 2043   10/19/18 0600  metroNIDAZOLE (FLAGYL) IVPB 500 mg  Status:  Discontinued     500 mg 100 mL/hr over 60 Minutes Intravenous Every 8 hours 10/19/18 0133 10/20/18 2046   10/19/18 0215  vancomycin (VANCOCIN) IVPB 1000 mg/200 mL premix     1,000 mg 200 mL/hr over 60 Minutes Intravenous  Once 10/19/18 0207 10/19/18 0540   10/18/18 1900  vancomycin (VANCOCIN) IVPB 1000 mg/200 mL premix     1,000 mg 200 mL/hr over 60 Minutes Intravenous  Once 10/18/18 1855 10/19/18 0030   10/18/18 1845  ceFEPIme (MAXIPIME) 2 g in sodium chloride 0.9 % 100 mL IVPB     2 g 200 mL/hr over 30 Minutes Intravenous  Once 10/18/18 1838 10/18/18 2210   10/18/18 1845  metroNIDAZOLE (FLAGYL) IVPB 500 mg     500 mg 100 mL/hr over 60 Minutes Intravenous  Once 10/18/18 1838 10/18/18 2320   10/18/18 1845   vancomycin (VANCOCIN) IVPB 1000 mg/200 mL premix  Status:  Discontinued     1,000 mg 200 mL/hr over 60 Minutes Intravenous  Once 10/18/18 1838 10/18/18 1842         Medications  Scheduled Meds:  acetaminophen  650 mg Oral Once   amoxicillin-clavulanate  1 tablet Oral Q12H   carbidopa-levodopa  1 tablet Oral 3 times per day   Chlorhexidine Gluconate Cloth  6 each Topical Q0600   enoxaparin (LOVENOX) injection  1 mg/kg Subcutaneous Q12H   mupirocin ointment  1 application Nasal BID   pantoprazole  40 mg Oral Q0600   simvastatin  20 mg Oral QPM   tamsulosin  0.4 mg Oral QHS   vancomycin  125  mg Oral QID   Continuous Infusions:  PRN Meds:.acetaminophen **OR** acetaminophen, HYDROcodone-acetaminophen, ondansetron **OR** ondansetron (ZOFRAN) IV, Resource ThickenUp Clear      Subjective:   Jason [name] was seen and examined today.  Pleasantly confused but easily reoriented.  New issue with C. difficile diarrhea however no fevers or chills.  No abdominal pain nausea or vomiting.  No chest pain or shortness of breath.    Objective:   Vitals:   10/21/18 0532 10/21/18 1044 10/21/18 1342 10/21/18 1347  BP: 140/80  110/65   Pulse: (!) 101 96 (!) 101 98  Resp: 18  (!) 24   Temp: 98 F (36.7 C)  97.6 F (36.4 C)   TempSrc: Oral  Oral   SpO2: 97%  99%   Weight:      Height:        Intake/Output Summary (Last 24 hours) at 10/21/2018 1421 Last data filed at 10/21/2018 0800 Gross per 24 hour  Intake 481.8 ml  Output 200 ml  Net 281.8 ml     Wt Readings from Last 3 Encounters:  10/19/18 85.2 kg  05/04/18 88.1 kg  04/12/18 88.3 kg    Physical Exam  General: Alert and oriented x2  Eyes:   HEENT:    Cardiovascular: S1 S2 clear,  RRR. No pedal edema b/l  Respiratory: CTAB, no wheezing, rales or rhonchi  Gastrointestinal: Soft, nontender, nondistended, NBS  Ext: no pedal edema bilaterally  Neuro: no new deficits  Musculoskeletal: No cyanosis,  clubbing  Skin: Sacral decub 1  Psych: Pleasantly confused but easily reoriented and then oriented at least x2          Data Reviewed:  I have personally reviewed following labs and imaging studies  Micro Results Recent Results (from the past 240 hour(s))  Blood Culture (routine x 2)     Status: None (Preliminary result)   Collection Time: 10/18/18  6:36 PM   Specimen: BLOOD  Result Value Ref Range Status   Specimen Description   Final    BLOOD LEFT ANTECUBITAL Performed at [facility], [place] [address], [place], [place] [address]    Special Requests   Final    BOTTLES DRAWN AEROBIC AND ANAEROBIC Blood Culture adequate volume Performed at [place] [address], [place], [place] [address]    Culture   Final    NO GROWTH 3 DAYS Performed at [facility], [place] [address], [place], [place] [address]    Report Status PENDING  Incomplete  SARS Coronavirus 2 (CEPHEID - Performed in [facility] hospital lab), Hosp Order     Status: None   Collection Time: 10/18/18  6:39 PM   Specimen: Nasopharyngeal Swab  Result Value Ref Range Status   SARS Coronavirus 2 NEGATIVE NEGATIVE Final    Comment: (NOTE) If result is NEGATIVE SARS-CoV-2 target nucleic acids are NOT DETECTED. The SARS-CoV-2 RNA is generally detectable in upper and lower  respiratory specimens during the acute phase of infection. The lowest  concentration of SARS-CoV-2 viral copies this assay can detect is 250  copies / mL. A negative result does not preclude SARS-CoV-2 infection  and should not be used as the sole basis for treatment or other  patient management decisions.  A negative result may occur with  improper specimen collection / handling, submission of specimen other  than nasopharyngeal swab, presence of viral mutation(s) within the  areas targeted by this assay, and inadequate number of viral copies  (<250  copies / mL). A negative result must be combined  with clinical  observations, patient history, and epidemiological information. If result is POSITIVE SARS-CoV-2 target nucleic acids are DETECTED. The SARS-CoV-2 RNA is generally detectable in upper and lower  respiratory specimens dur ing the acute phase of infection.  Positive  results are indicative of active infection with SARS-CoV-2.  Clinical  correlation with patient history and other diagnostic information is  necessary to determine patient infection status.  Positive results do  not rule out bacterial infection or co-infection with other viruses. If result is PRESUMPTIVE POSTIVE SARS-CoV-2 nucleic acids MAY BE PRESENT.   A presumptive positive result was obtained on the submitted specimen  and confirmed on repeat testing.  While 2019 novel coronavirus  (SARS-CoV-2) nucleic acids may be present in the submitted sample  additional confirmatory testing may be necessary for epidemiological  and / or clinical management purposes  to differentiate between  SARS-CoV-2 and other Sarbecovirus currently known to infect humans.  If clinically indicated additional testing with an alternate test  methodology [phone]) is advised. The SARS-CoV-2 RNA is generally  detectable in upper and lower respiratory sp ecimens during the acute  phase of infection. The expected result is Negative. Fact Sheet for Patients:  StrictlyIdeas.no Fact Sheet for Healthcare Providers: BankingDealers.co.za This test is not yet approved or cleared by the [place] FDA and has been authorized for detection and/or diagnosis of SARS-CoV-2 by FDA under an Emergency Use Authorization (EUA).  This EUA will remain in effect (meaning this test can be used) for the duration of the COVID-19 declaration under Section 564(b)(1) of the Act, 21 U.S.C. section 360bbb-3(b)(1), unless the authorization is terminated or revoked sooner. Performed at [facility], [place] [address], [place], [place] [address]   Blood Culture (routine x 2)     Status: None (Preliminary result)   Collection Time: 10/18/18  6:41 PM   Specimen: BLOOD  Result Value Ref Range Status   Specimen Description   Final    BLOOD RIGHT ANTECUBITAL Performed at [facility] [address], [place], [place] [address]    Special Requests   Final    BOTTLES DRAWN AEROBIC AND ANAEROBIC Blood Culture adequate volume Performed at [facility] [address], [place], [place] [address]    Culture   Final    NO GROWTH 3 DAYS Performed at [facility], [place] [address] [place], [place] [address]    Report Status PENDING  Incomplete  Urine culture     Status: None   Collection Time: 10/18/18  7:11 PM   Specimen: In/Out Cath Urine  Result Value Ref Range Status   Specimen Description   Final    IN/OUT CATH URINE Performed at [facility] [address] [place], [place] [address]    Special Requests   Final    NONE Performed at [facility], [place] [address], [place], [place] [address]    Culture   Final    NO GROWTH Performed at [facility], [place] [address], [place], [place] [address]    Report Status 10/20/2018 FINAL  Final  C difficile quick scan w PCR reflex     Status: Abnormal   Collection Time: 10/18/18  9:44 PM   Specimen: STOOL  Result Value Ref Range Status   C Diff antigen POSITIVE (A) NEGATIVE Final   C Diff toxin POSITIVE (A) NEGATIVE Final   C Diff  interpretation Toxin producing C. difficile detected.  Final    Comment: CRITICAL RESULT CALLED TO, READ BACK BY AND VERIFIED WITH: R.Milton Ferguson [id number] @[id number]  BY V.WILKINS Performed at [facility], [place] [address] [place] [address], [place], [place] [address]   Gastrointestinal Panel by PCR , Stool     Status: None   Collection Time: 10/18/18  9:44 PM   Specimen: STOOL  Result Value Ref Range Status   Campylobacter  species NOT DETECTED NOT DETECTED Final   Plesimonas shigelloides NOT DETECTED NOT DETECTED Final   Salmonella species NOT DETECTED NOT DETECTED Final   Yersinia enterocolitica NOT DETECTED NOT DETECTED Final   Vibrio species NOT DETECTED NOT DETECTED Final   Vibrio cholerae NOT DETECTED NOT DETECTED Final   Enteroaggregative E coli (EAEC) NOT DETECTED NOT DETECTED Final   Enteropathogenic E coli (EPEC) NOT DETECTED NOT DETECTED Final   Enterotoxigenic E coli (ETEC) NOT DETECTED NOT DETECTED Final   Shiga like toxin producing E coli (STEC) NOT DETECTED NOT DETECTED Final   Shigella/Enteroinvasive E coli (EIEC) NOT DETECTED NOT DETECTED Final   Cryptosporidium NOT DETECTED NOT DETECTED Final   Cyclospora cayetanensis NOT DETECTED NOT DETECTED Final   Entamoeba histolytica NOT DETECTED NOT DETECTED Final   Giardia lamblia NOT DETECTED NOT DETECTED Final   Adenovirus F40/41 NOT DETECTED NOT DETECTED Final   Astrovirus NOT DETECTED NOT DETECTED Final   Norovirus GI/GII NOT DETECTED NOT DETECTED Final   Rotavirus A NOT DETECTED NOT DETECTED Final   Sapovirus (I, II, IV, and V) NOT DETECTED NOT DETECTED Final    Comment: Performed at [facility], [place] [place], [place] [address]  MRSA PCR Screening     Status: Abnormal   Collection Time: 10/19/18  2:30 AM   Specimen: Nasopharyngeal  Result Value Ref Range Status   MRSA by PCR POSITIVE (A) NEGATIVE Final    Comment:        The GeneXpert MRSA Assay (FDA approved for NASAL specimens only), is one component of a comprehensive MRSA colonization surveillance program. It is not intended to diagnose MRSA infection nor to guide or monitor treatment for MRSA infections. RESULT CALLED TO, READ BACK BY AND VERIFIED WITH: C.GRIFFITH RN AT 0909 ON 10/19/2018 BY S.VANHOORNE Performed at [facility], [place] [address], [place], [place] [address]     Radiology Reports Ct Head Wo Contrast  Result Date:  10/18/2018 CLINICAL DATA:  Fall, head trauma EXAM: CT HEAD WITHOUT CONTRAST TECHNIQUE: Contiguous axial images were obtained from the base of the skull through the vertex without intravenous contrast. COMPARISON:  06/30/2016 FINDINGS: Brain: There is atrophy and chronic small vessel disease changes. No acute intracranial abnormality. Specifically, no hemorrhage, hydrocephalus, mass lesion, acute infarction, or significant intracranial injury. Vascular: No hyperdense vessel or unexpected calcification. Skull: No acute calvarial abnormality. Sinuses/Orbits: Visualized paranasal sinuses and mastoids clear. Orbital soft tissues unremarkable. Other: None IMPRESSION: Atrophy, chronic microvascular disease. No acute intracranial abnormality. Electronically Signed   By: Rolm Baptise M.D.   On: 10/18/2018 19:27   Dg Chest Port 1 View  Result Date: 10/18/2018 CLINICAL DATA:  Fever EXAM: PORTABLE CHEST 1 VIEW COMPARISON:  04/08/2018 FINDINGS: Cardiomegaly. Vascular congestion. Diffuse interstitial prominence likely interstitial edema. Confluent airspace disease noted in the left perihilar and lower lung regions concerning for asymmetric edema or pneumonia. Suspect small left effusion. IMPRESSION: Cardiomegaly with vascular congestion and interstitial prominence, likely interstitial edema. Focal left perihilar and lower lobe lobe opacity which could reflect asymmetric edema  or infection. Electronically Signed   By: Rolm Baptise M.D.   On: 10/18/2018 19:10   Dg Swallowing Func-speech Pathology  Result Date: 10/19/2018 Objective Swallowing Evaluation: Type of Study: MBS-Modified Barium Swallow Study  Patient Details Name: AURON TADROS MRN: [id number] Date of Birth: 1927/09/19 Today's Date: 10/19/2018 Time: SLP Start Time (ACUTE ONLY): 1505 -SLP Stop Time (ACUTE ONLY): 1025 SLP Time Calculation (min) (ACUTE ONLY): 45 min Past Medical History: Past Medical History: Diagnosis Date  Arthritis   Barrett esophagus   Bladder  cancer ([place]) dx'd 8527,7824  surg only  CHF (congestive heart failure) (HCC)   Colon polyps   adenomatous  Difficulty sleeping   OCCASIONALLY  Diverticulosis of colon (without mention of hemorrhage)   Frequency of urination   GERD (gastroesophageal reflux disease)   Hiatal hernia   History of bladder cancer 1981  EXCISION ONLY  History of skin cancer   BASAL CELL  Hyperlipidemia   Lymphoma, non-Hodgkin's ([place]) 2011  [place] degeneration   LEFT EYE  Multiple nodules of lung 09/16/2011  PAF (paroxysmal atrial fibrillation) ([place])   Personal history of colonic polyps 07/15/2010  tubular adenoma  Prostate cancer ([place])   patient denies  PUD (peptic ulcer disease)   Stomach cancer ([place])   NHL origin  Stroke ([place])   Weakness  Past Surgical History: Past Surgical History: Procedure Laterality Date  APPENDECTOMY    bone spur    left shoulder  CHOLECYSTECTOMY    KNEE ARTHROPLASTY Left 07/30/2015  Procedure: LEFT TOTAL KNEE ARTHROPLASTY WITH COMPUTER NAVIGATION;  Surgeon: Rod Can, MD;  Location: [facility];  Service: Orthopedics;  Laterality: Left;  LASER OF PROSTATE W/ GREEN LIGHT PVP    TENDON REPAIR    right arm  TRIGGER FINGER RELEASE    bilateral hands  WRIST SURGERY    Bilateral HPI: 85 yo male admitted from [facility] with AMS, found to have renal failure and possible pna.  Pt has h/o  ???  Parkinsons', dysphagia, pontine cva, renal disease and pna.  He is on a regular/thin diet at his facility.Pt reports he coughs "all the time" when he eats and states its worse with food than drinks.  Subjective: pt awake in chair Assessment / Plan / Recommendation CHL IP CLINICAL IMPRESSIONS 10/19/2018 Clinical Impression Patient presents with significantly worsening dysphagia compared to during his last MBS in 02/2018 resulting in gross residuals and aspiration/penetration.  Secretions also noted that mix with oropharyngeal barium residuals.  Oropharyngeal dysphagia due to his Parkinsons  and pontine stroke likely exacerbated by his acute illness.  Oral transiting discoordinated with some lingual pumping and premature spillage/oral residuals.  Pharyngeal swallow initiation was challenging with pt allowing laryngeal penetration for 2 seconds with nectar before pharyngeal swallow even triggered, thus allowing penetration and aspiration.  With mild aspiration of nectar *silent* - cued cough removed aspirates into pharynx which pt expelled into oral cavity per SLP verbal cue before expectorating.  Penetration and aspiration occurred before, during and after the swallow.  Pt required oral suctioning frequently during MBS due to expectoration of pharyngeal retention.   Motor planning of cued swallows were difficult for pt to perform with initial swallow of puree as pt allowed bolus to be retained in pharynx/vallecular region, thus no pudding was transited into esophagus initially.  Secondary cued dry swallows to clear liquid pharyngeal retention also ineffective due to nearly absent/poor pharyngeal motility.   Gross residuals present even when swallow timing was adequate due to decreased  pharyngeal and laryngeal motility.  With aspiration, pt does not initially sense and when sensation is delayed due to larger aspiration amount, bolus is too deep in trachea for him to clear.  Various postures including chin tuck and head turn were not effective to decrease residuals or prevent aspiration/penetration with solids or liquids via cup/straw.  Decreasing amount to TEASPOOON only of thin and nectar using chin tuck posture were the only boluses pt did not aspirate nor present with gross residuals.  Pt will be functionally unable to receive adequate hydration/nutrition on liquid via tsp diet.  Pt reports he would cough/expectorate even prior to admission -strength of cough/expectoration with ability to mobilize has likely allowed aspiration tolerance.   However given acute on chronic dysphagia, worsening dysphagia,  deconditioning, current pna (last one 12/2017) and full code status - SLP highly recommends a palliative referral to establish his goals of care.    Pending GOC, recommend tsps ONLY of nectar, thin with chin tuck posture and multiple swallows.   Will follow up for dysphagia management, education, possibly RMST and will review importance of oral care given pt with copious secretions likely due to his Parkinsons.  Educated pt to findings/recommendations and concerns.  He reported understanding.  SLP Visit Diagnosis Dysphagia, oropharyngeal phase (R13.12) Attention and concentration deficit following -- Frontal lobe and executive function deficit following -- Impact on safety and function Severe aspiration risk   CHL IP TREATMENT RECOMMENDATION 10/19/2018 Treatment Recommendations Therapy as outlined in treatment plan below   Prognosis 10/19/2018 Prognosis for Safe Diet Advancement Guarded Barriers to Reach Goals Time post onset;Severity of deficits Barriers/Prognosis Comment -- CHL IP DIET RECOMMENDATION 10/19/2018 SLP Diet Recommendations (No Data) Liquid Administration via -- Medication Administration -- Compensations Slow rate;Small sips/bites;Chin tuck Postural Changes --   CHL IP OTHER RECOMMENDATIONS 10/19/2018 Recommended Consults -- Oral Care Recommendations Oral care QID Other Recommendations Order thickener from pharmacy   CHL IP FOLLOW UP RECOMMENDATIONS 10/19/2018 Follow up Recommendations (No Data)   CHL IP FREQUENCY AND DURATION 10/19/2018 Speech Therapy Frequency (ACUTE ONLY) min 2x/week Treatment Duration 2 weeks      CHL IP ORAL PHASE 10/19/2018 Oral Phase Impaired Oral - Pudding Teaspoon -- Oral - Pudding Cup -- Oral - Honey Teaspoon -- Oral - Honey Cup -- Oral - Nectar Teaspoon Weak lingual manipulation;Lingual pumping;Premature spillage Oral - Nectar Cup Reduced posterior propulsion Oral - Nectar Straw Weak lingual manipulation;Reduced posterior propulsion Oral - Thin Teaspoon Weak lingual  manipulation;Lingual/palatal residue;Reduced posterior propulsion;Premature spillage Oral - Thin Cup Lingual/palatal residue;Reduced posterior propulsion;Premature spillage Oral - Thin Straw Lingual/palatal residue;Weak lingual manipulation;Reduced posterior propulsion;Premature spillage Oral - Puree Weak lingual manipulation;Reduced posterior propulsion;Lingual/palatal residue Oral - Mech Soft Reduced posterior propulsion;Weak lingual manipulation;Impaired mastication;Premature spillage;Lingual/palatal residue Oral - Regular -- Oral - Multi-Consistency -- Oral - Pill -- Oral Phase - Comment --  CHL IP PHARYNGEAL PHASE 10/19/2018 Pharyngeal Phase Impaired Pharyngeal- Pudding Teaspoon -- Pharyngeal -- Pharyngeal- Pudding Cup -- Pharyngeal -- Pharyngeal- Honey Teaspoon -- Pharyngeal -- Pharyngeal- Honey Cup -- Pharyngeal -- Pharyngeal- Nectar Teaspoon Delayed swallow initiation-vallecula;Reduced anterior laryngeal mobility;Reduced laryngeal elevation;Reduced airway/laryngeal closure;Reduced tongue base retraction;Penetration/Aspiration during swallow;Penetration/Apiration after swallow;Pharyngeal residue - valleculae Pharyngeal Material enters airway, passes BELOW cords then ejected out;Material enters airway, passes BELOW cords without attempt by patient to eject out (silent aspiration) Pharyngeal- Nectar Cup Delayed swallow initiation-vallecula;Reduced epiglottic inversion;Reduced laryngeal elevation;Reduced airway/laryngeal closure;Reduced tongue base retraction;Pharyngeal residue - valleculae;Compensatory strategies attempted (with notebox) Pharyngeal -- Pharyngeal- Nectar Straw Reduced epiglottic inversion;Reduced laryngeal elevation;Reduced airway/laryngeal closure;Reduced  tongue base retraction;Penetration/Aspiration during swallow;Penetration/Apiration after swallow;Moderate aspiration;Pharyngeal residue - valleculae;Compensatory strategies attempted (with notebox) Pharyngeal Material enters airway, passes  BELOW cords and not ejected out despite cough attempt by patient;Material enters airway, passes BELOW cords without attempt by patient to eject out (silent aspiration) Pharyngeal- Thin Teaspoon Delayed swallow initiation-vallecula;Reduced epiglottic inversion;Reduced anterior laryngeal mobility;Reduced laryngeal elevation;Reduced airway/laryngeal closure;Reduced tongue base retraction;Pharyngeal residue - valleculae;Compensatory strategies attempted (with notebox) Pharyngeal -- Pharyngeal- Thin Cup Delayed swallow initiation-vallecula;Reduced epiglottic inversion;Reduced laryngeal elevation;Reduced airway/laryngeal closure;Reduced tongue base retraction;Penetration/Aspiration before swallow;Penetration/Apiration after swallow;Pharyngeal residue - valleculae Pharyngeal Material enters airway, passes BELOW cords without attempt by patient to eject out (silent aspiration) Pharyngeal- Thin Straw Delayed swallow initiation-vallecula;Reduced epiglottic inversion;Reduced anterior laryngeal mobility;Reduced laryngeal elevation;Reduced airway/laryngeal closure;Reduced tongue base retraction;Penetration/Aspiration before swallow;Penetration/Apiration after swallow;Pharyngeal residue - valleculae Pharyngeal Material enters airway, passes BELOW cords and not ejected out despite cough attempt by patient;Material enters airway, passes BELOW cords without attempt by patient to eject out (silent aspiration) Pharyngeal- Puree Delayed swallow initiation-vallecula;Reduced pharyngeal peristalsis;Reduced epiglottic inversion;Reduced anterior laryngeal mobility;Reduced laryngeal elevation;Reduced airway/laryngeal closure;Reduced tongue base retraction;Pharyngeal residue - valleculae Pharyngeal -- Pharyngeal- Mechanical Soft Delayed swallow initiation-vallecula;Reduced pharyngeal peristalsis;Reduced epiglottic inversion;Reduced anterior laryngeal mobility;Reduced laryngeal elevation;Reduced airway/laryngeal closure;Reduced tongue base  retraction;Pharyngeal residue - valleculae Pharyngeal -- Pharyngeal- Regular -- Pharyngeal -- Pharyngeal- Multi-consistency -- Pharyngeal -- Pharyngeal- Pill -- Pharyngeal -- Pharyngeal Comment weak initial swallow with pudding that resulted in 0% transiting into esophagus, 2nd swallow effective to clear approx 50%, chin tuck helpful with tsp amounts to keep pt from aspirating as all of the bolus is retained in the pharynx before swallow triggered, larger amounts were aspirated, pt required cues to cough/"hock" to expectorate barium throughout the study  CHL IP CERVICAL ESOPHAGEAL PHASE 10/19/2018 Cervical Esophageal Phase Impaired Pudding Teaspoon -- Pudding Cup -- Honey Teaspoon -- Honey Cup -- Nectar Teaspoon -- Nectar Cup -- Nectar Straw -- Thin Teaspoon -- Thin Cup -- Thin Straw -- Puree -- Mechanical Soft -- Regular -- Multi-consistency -- Pill -- Cervical Esophageal Comment appearance of prominent cricopharyngeus did not impair barium flow Luanna Salk, MS [facility] SLP Acute Rehab Services Pager [phone] Office [phone] Macario Golds 10/19/2018, 5:45 PM               Lab Data:  CBC: Recent Labs  Lab 10/18/18 1905 10/19/18 0611 10/20/18 0559 10/21/18 0527  WBC 9.9 7.1 11.3* 7.7  NEUTROABS 8.3*  --   --   --   HGB 13.9 12.5* 12.8* 12.7*  HCT 47.3 41.0 43.2 41.6  MCV 90.4 89.9 90.8 88.9  PLT 204 152 142* 277   Basic Metabolic Panel: Recent Labs  Lab 10/18/18 1905 10/19/18 0611 10/20/18 0559 10/21/18 0527  NA 138 138 139 139  K 4.8 4.3 4.5 3.8  CL 103 109 110 109  CO2 25 21* 19* 22  GLUCOSE 116* 94 76 91  BUN 24* 23 24* 27*  CREATININE 1.87* 1.53* 1.39* 1.69*  CALCIUM 8.6* 8.0* 8.0* 8.3*  MG  --  1.8  --   --   PHOS  --  3.1  --   --    GFR: Estimated Creatinine Clearance: 32.2 mL/min (A) (by C-G formula based on SCr of 1.69 mg/dL (H)). Liver Function Tests: Recent Labs  Lab 10/18/18 1905 10/19/18 0611  AST 19 17  ALT 9 9  ALKPHOS 42 32*  BILITOT 0.5 0.5    PROT 7.8 6.4*  ALBUMIN 2.8* 2.3*   No results for input(s): LIPASE, AMYLASE in the  last 168 hours. No results for input(s): AMMONIA in the last 168 hours. Coagulation Profile: Recent Labs  Lab 10/18/18 1905  INR 1.6*   Cardiac Enzymes: No results for input(s): CKTOTAL, CKMB, CKMBINDEX, TROPONINI in the last 168 hours. BNP (last 3 results) No results for input(s): PROBNP in the last 8760 hours. HbA1C: No results for input(s): HGBA1C in the last 72 hours. CBG: No results for input(s): GLUCAP in the last 168 hours. Lipid Profile: No results for input(s): CHOL, HDL, LDLCALC, TRIG, CHOLHDL, LDLDIRECT in the last 72 hours. Thyroid Function Tests: Recent Labs    10/19/18 0611  TSH 2.038   Anemia Panel: No results for input(s): VITAMINB12, FOLATE, FERRITIN, TIBC, IRON, RETICCTPCT in the last 72 hours. Urine analysis:    Component Value Date/Time   COLORURINE YELLOW 10/18/2018 1911   APPEARANCEUR HAZY (A) 10/18/2018 1911   LABSPEC 1.015 10/18/2018 1911   PHURINE 5.0 10/18/2018 1911   GLUCOSEU NEGATIVE 10/18/2018 1911   HGBUR NEGATIVE 10/18/2018 1911   BILIRUBINUR NEGATIVE 10/18/2018 1911   KETONESUR NEGATIVE 10/18/2018 1911   PROTEINUR NEGATIVE 10/18/2018 1911   UROBILINOGEN 1.0 07/07/2011 1942   NITRITE NEGATIVE 10/18/2018 1911   LEUKOCYTESUR NEGATIVE 10/18/2018 1911     Aria Jarrard M.D. [facility] 10/21/2018, 2:21 PM  Pager: [phone] Between 7am to 7pm - call Pager - [phone]  After 7pm go to [email or web address] - password [email or web address]  Call night coverage person covering after 7pm

## 2018-10-21 NOTE — Progress Notes (Signed)
Daily Progress Note   Patient Name: Jason [name]       Date: 10/21/2018 DOB: Aug 17, 1927  Age: 85 y.o. MRN#: [id number] Attending Physician: Mendel Corning, MD Primary Care Physician: [name], MD Admit Date: 10/18/2018  Reason for Consultation/Follow-up: Establishing goals of care  Subjective: I saw and examined Jason [name] today.  He is awake and alert but seems more confused today.  Easily reoriented and no behavior concerns.  Noted to have some coughing overnight per nursing report, but this has been better today and he had 50% of breakfast without issue.  He denies any complaints.  I called and discussed with his daughter, [name].  We reviewed his clinical course including diagnosis of c-diff.  Also discussed that he has been placed on dysphagia diet and he told me that he enjoyed being allowed something to eat.  [name] continues to endorse oveall goal is to get back to [name].  I let her know that I would discuss with [facility] team tomorrow to see if they can help [place] determine if they are able to meet his needs.  Length of Stay: 2  Current Medications: Scheduled Meds:  . acetaminophen  650 mg Oral Once  . amoxicillin-clavulanate  1 tablet Oral Q12H  . carbidopa-levodopa  1 tablet Oral 3 times per day  . Chlorhexidine Gluconate Cloth  6 each Topical Q0600  . enoxaparin (LOVENOX) injection  1 mg/kg Subcutaneous Q12H  . mupirocin ointment  1 application Nasal BID  . pantoprazole  40 mg Oral Q0600  . simvastatin  20 mg Oral QPM  . tamsulosin  0.4 mg Oral QHS  . vancomycin  125 mg Oral QID    Continuous Infusions:   PRN Meds: acetaminophen **OR** acetaminophen, HYDROcodone-acetaminophen, ondansetron **OR** ondansetron (ZOFRAN) IV, Resource ThickenUp Clear  Physical Exam          General: Alert, awake, in no acute distress. Mild confusion. Heart: Regular rate and rhythm. No murmur appreciated. Lungs: Good air movement, clear Abdomen: Soft, nontender, nondistended, positive bowel sounds.  Ext: No significant edema Skin: Warm and dry Neuro: Grossly intact, nonfocal.   Vital Signs: BP 110/65 (BP Location: Left Arm)   Pulse (!) 101   Temp 97.6 F (36.4 C) (Oral)   Resp (!) 24   Ht 5\' 11"  (1.803 m)  Wt 85.2 kg   SpO2 99%   BMI 26.20 kg/m  SpO2: SpO2: 99 % O2 Device: O2 Device: Room Air O2 Flow Rate: O2 Flow Rate (L/min): 0 L/min  Intake/output summary:   Intake/Output Summary (Last 24 hours) at 10/21/2018 1343 Last data filed at 10/21/2018 0800 Gross per 24 hour  Intake 481.8 ml  Output 200 ml  Net 281.8 ml   LBM: Last BM Date: 10/21/18 Baseline Weight: Weight: 85.2 kg Most recent weight: Weight: 85.2 kg       Palliative Assessment/Data:    Flowsheet Rows     Most Recent Value  Intake Tab  Referral Department  Hospitalist  Unit at Time of Referral  Med/Surg Unit  Palliative Care Primary Diagnosis  Sepsis/Infectious Disease  Date Notified  10/19/18  Palliative Care Type  Return patient Palliative Care  Reason for referral  Clarify Goals of Care  Date of Admission  10/18/18  Date first seen by Palliative Care  10/20/18  # of days Palliative referral response time  1 Day(s)  # of days IP prior to Palliative referral  1  Clinical Assessment  Palliative Performance Scale Score  60%  Psychosocial & Spiritual Assessment  Palliative Care Outcomes  Patient/Family meeting held?  Yes  Who was at the meeting?  daughter via phone  Palliative Care Outcomes  Clarified goals of care, Changed CPR status, Completed durable DNR      Patient Active Problem List   Diagnosis Date Noted  . Dehydration 10/18/2018  . SIRS (systemic inflammatory response syndrome) ([place]) 10/18/2018  . Decubitus ulcer of sacral region, stage 1 10/18/2018  . Memory  loss 05/14/2018  . Parkinsonism ([place]) 05/14/2018  . History of stroke 05/04/2018  . Dilated aortic root ([place]) 05/04/2018  . Palliative care by specialist   . Goals of care, counseling/discussion   . HCAP (healthcare-associated pneumonia) 12/22/2017  . Sepsis ([place]) 12/22/2017  . Chronic diastolic (congestive) heart failure ([place]) 12/22/2017  . Aspiration pneumonia ([place]) 12/22/2017  . Diarrhea 12/22/2017  . Fall at home 10/19/2016  . Gait abnormality 09/05/2016  . Parkinson's disease ([place]) 09/05/2016  . CVA (cerebral vascular accident) ([place]) 07/01/2016  . Dysarthria   . History of cancer   . TIA (transient ischemic attack) 06/30/2016  . Hemiplegia affecting dominant side ([place]) 06/30/2016  . History of bladder cancer   . Dysphagia   . Hyperglycemia   . Acute renal failure superimposed on stage 3 chronic kidney disease ([place])   . Acute blood loss anemia   . Leukocytosis   . Weakness   . Community acquired pneumonia 06/16/2016  . Unresponsiveness   . CAP (community acquired pneumonia) 06/15/2016  . Paroxysmal atrial fibrillation ([place]) 06/15/2016  . Pressure injury of skin 06/15/2016  . Lobar pneumonia ([place])   . Acute congestive heart failure ([place])   . Primary osteoarthritis of left knee 07/30/2015  . BPH (benign prostatic hyperplasia) 07/16/2012  . Multiple nodules of lung 09/16/2011  . GERD (gastroesophageal reflux disease) 06/14/2011  . Constipation, chronic 06/14/2011  . NHL (non-Hodgkin's lymphoma) ([place]) 01/03/2010  . Malignant neoplasm of bladder ([place]) 05/28/2007  . Hyperlipidemia 05/28/2007  . HIATAL HERNIA 05/28/2007  . BARRETT'S ESOPHAGUS, HX OF 05/28/2007    Palliative Care Assessment & Plan   Patient Profile: 85 y.o. male  with past medical history of chronic diastolic heart failure, A. fib on Eliquis, CKD, non-Hodgkin's lymphoma, Parkinson's, aspiration pneumonia, stroke, bladder cancer, GERD, and prostate cancer admitted on 10/18/2018 with Sirs related to  community-acquired  pneumonia with high concern for aspiration, dysphasia, and acute renal failure.  Palliative consulted for goals of care.   Assessment: Patient Active Problem List   Diagnosis Date Noted  . Dehydration 10/18/2018  . SIRS (systemic inflammatory response syndrome) ([place]) 10/18/2018  . Decubitus ulcer of sacral region, stage 1 10/18/2018  . Memory loss 05/14/2018  . Parkinsonism ([place]) 05/14/2018  . History of stroke 05/04/2018  . Dilated aortic root ([place]) 05/04/2018  . Palliative care by specialist   . Goals of care, counseling/discussion   . HCAP (healthcare-associated pneumonia) 12/22/2017  . Sepsis ([place]) 12/22/2017  . Chronic diastolic (congestive) heart failure ([place]) 12/22/2017  . Aspiration pneumonia ([place]) 12/22/2017  . Diarrhea 12/22/2017  . Fall at home 10/19/2016  . Gait abnormality 09/05/2016  . Parkinson's disease ([place]) 09/05/2016  . CVA (cerebral vascular accident) ([place]) 07/01/2016  . Dysarthria   . History of cancer   . TIA (transient ischemic attack) 06/30/2016  . Hemiplegia affecting dominant side ([place]) 06/30/2016  . History of bladder cancer   . Dysphagia   . Hyperglycemia   . Acute renal failure superimposed on stage 3 chronic kidney disease ([place])   . Acute blood loss anemia   . Leukocytosis   . Weakness   . Community acquired pneumonia 06/16/2016  . Unresponsiveness   . CAP (community acquired pneumonia) 06/15/2016  . Paroxysmal atrial fibrillation ([place]) 06/15/2016  . Pressure injury of skin 06/15/2016  . Lobar pneumonia ([place])   . Acute congestive heart failure ([place])   . Primary osteoarthritis of left knee 07/30/2015  . BPH (benign prostatic hyperplasia) 07/16/2012  . Multiple nodules of lung 09/16/2011  . GERD (gastroesophageal reflux disease) 06/14/2011  . Constipation, chronic 06/14/2011  . NHL (non-Hodgkin's lymphoma) ([place]) 01/03/2010  . Malignant neoplasm of bladder ([place]) 05/28/2007  . Hyperlipidemia 05/28/2007  . HIATAL HERNIA  05/28/2007  . BARRETT'S ESOPHAGUS, HX OF 05/28/2007     Recommendations/Plan: - DNR/DNI - Continue current interventions, including continuation of current antibiotics for PNA and c-diff.  Plan is to treat this pneumonia, but with understanding that he will aspirate again at some point in the future.  Consideration for hospice moving forward depending on his clinical course. - Overall goal is to get back to [name]. Family would be agreeable to hospice if he qualifies. Daughter reports that she has been told before that he was not a candidate at that time.  I believe with recurrent aspiration that has now worsened that he is likely hospice appropriate at this point.  Will discuss with [facility] team tomorrow - Will ask SLP to see again but with goal of working with him on diet with understanding that aspiration will still occur.  Family/patient very clear about no feeding tube/PEG.  Goals of Care and Additional Recommendations:  Limitations on Scope of Treatment: Avoid Hospitalization  Code Status:    Code Status Orders  (From admission, onward)         Start     Ordered   10/20/18 1325  Do not attempt resuscitation (DNR)  Continuous    Question Answer Comment  In the event of cardiac or respiratory ARREST Do not call a "code blue"   In the event of cardiac or respiratory ARREST Do not perform Intubation, CPR, defibrillation or ACLS   In the event of cardiac or respiratory ARREST Use medication by any route, position, wound care, and other measures to relive pain and suffering. May use oxygen, suction and manual treatment of airway obstruction as needed  for comfort.      10/20/18 1324        Code Status History    Date Active Date Inactive Code Status Order ID Comments User Context   10/19/2018 0134 10/20/2018 1324 Full Code [id number]  Toy Baker, MD Inpatient   12/25/2017 0956 12/27/2017 2341 DNR [id number]  Domenic Polite, MD Inpatient   12/22/2017 2041 12/25/2017 0956 Full  Code [id number]  Ivor Costa, MD ED   10/19/2016 1558 10/28/2016 1942 Full Code [id number]  Vivia Ewing, PA-C ED   06/30/2016 2102 07/07/2016 1814 Full Code [id number]  Norval Morton, MD ED   06/15/2016 1038 06/20/2016 1819 Full Code [id number]  Johnney Ou ED   Advance Care Planning Activity       Prognosis:   < 6 months most likely.  -this will largely depend upon how he does with reinitiation of diet.  He remains at high risk for recurrent aspiration as demonstrated by worsened MBS this admission.  While it has been 10 months since last admission for aspiration, this is something that progresses in severity and frequency.  If he continues to aspirate with reinitiation of diet, I believe that there is a high likelihood we could be in the last 6 months of life if his disease follows its natural course and I would recommend election of his hospice benefits if possible.  Discharge Planning: Hopefully back to [name] with hospice- Will discuss with transition of care team on Monday   Care plan was discussed with patient, daughter  Thank you for allowing the Palliative Medicine Team to assist in the care of this patient.   Time In: 1130 Time Out: 1215 Total Time 45 Prolonged Time Billed No      Greater than 50%  of this time was spent counseling and coordinating care related to the above assessment and plan.  Micheline Rough, MD  Please contact Palliative Medicine Team phone at [phone] for questions and concerns.

## 2018-10-21 NOTE — Progress Notes (Signed)
Pharmacy Brief Note:  Augmentin course to be completed on 10/23/18 for aspiration PNA.   Per discussion with MD, will increase duration of PO vancomycin to complete a course of 10 days of PO vancomycin after Augmentin course complete.   PO vancomycin through 11/02/18  Lenis Noon, PharmD 10/21/18 8:41 AM

## 2018-10-22 ENCOUNTER — Inpatient Hospital Stay (HOSPITAL_COMMUNITY): Payer: Medicare Other

## 2018-10-22 LAB — CBC
HCT: 37.7 % — ABNORMAL LOW (ref 39.0–52.0)
Hemoglobin: 11.4 g/dL — ABNORMAL LOW (ref 13.0–17.0)
MCH: 26.7 pg (ref 26.0–34.0)
MCHC: 30.2 g/dL (ref 30.0–36.0)
MCV: 88.3 fL (ref 80.0–100.0)
Platelets: 164 10*3/uL (ref 150–400)
RBC: 4.27 MIL/uL (ref 4.22–5.81)
RDW: 14.5 % (ref 11.5–15.5)
WBC: 5.4 10*3/uL (ref 4.0–10.5)
nRBC: 0 % (ref 0.0–0.2)

## 2018-10-22 LAB — BASIC METABOLIC PANEL
Anion gap: 10 (ref 5–15)
BUN: 26 mg/dL — ABNORMAL HIGH (ref 8–23)
CO2: 18 mmol/L — ABNORMAL LOW (ref 22–32)
Calcium: 8.1 mg/dL — ABNORMAL LOW (ref 8.9–10.3)
Chloride: 112 mmol/L — ABNORMAL HIGH (ref 98–111)
Creatinine, Ser: 1.58 mg/dL — ABNORMAL HIGH (ref 0.61–1.24)
GFR calc Af Amer: 45 mL/min — ABNORMAL LOW (ref >60)
GFR calc non Af Amer: 38 mL/min — ABNORMAL LOW (ref >60)
Glucose, Bld: 94 mg/dL (ref 70–99)
Potassium: 3.2 mmol/L — ABNORMAL LOW (ref 3.5–5.1)
Sodium: 140 mmol/L (ref 135–145)

## 2018-10-22 MED ORDER — POTASSIUM CHLORIDE 10 MEQ/100ML IV SOLN
10.0000 meq | INTRAVENOUS | Status: AC
  Administered 2018-10-22 (×3): 10 meq via INTRAVENOUS
  Filled 2018-10-22 (×3): qty 100

## 2018-10-22 MED ORDER — FUROSEMIDE 10 MG/ML IJ SOLN
20.0000 mg | Freq: Once | INTRAMUSCULAR | Status: AC
  Administered 2018-10-22: 20 mg via INTRAVENOUS
  Filled 2018-10-22: qty 2

## 2018-10-22 MED ORDER — FUROSEMIDE 20 MG PO TABS
20.0000 mg | ORAL_TABLET | Freq: Every day | ORAL | Status: DC
  Administered 2018-10-22 – 2018-10-24 (×3): 20 mg via ORAL
  Filled 2018-10-22 (×3): qty 1

## 2018-10-22 MED ORDER — MELATONIN 3 MG PO TABS
3.0000 mg | ORAL_TABLET | Freq: Every day | ORAL | Status: DC
  Administered 2018-10-22 – 2018-10-23 (×2): 3 mg via ORAL
  Filled 2018-10-22 (×3): qty 1

## 2018-10-22 NOTE — Progress Notes (Signed)
[facility]                                                                              Patient Demographics  Jason [name], is a 85 y.o. male, DOB - 07-28-1927, KYH:[id number]  Admit date - 10/18/2018   Admitting Physician Toy Baker, MD  Outpatient Primary MD for the patient is [name], MD  Outpatient specialists:   LOS - 3  days   Medical records reviewed and are as summarized below:    Chief Complaint  Patient presents with   Fatigue   Fever       Brief summary   Patient is a 85 year old male with history of chronic diastolic CHF, paroxysmal A. fib on Eliquis, CKD stage III, non-Hodgkin lymphoma, Parkinson's disease, history of aspiration pneumonia, stroke, bladder cancer, GERD, prostate CA, peptic ulcer disease presented with mechanical fall.  Per admission history, patient was rushing to get to the bathroom due to diarrhea however was so weak and hence fell.  He could not get up due to weakness, denied any injuries.  Patient also reported fevers. COVID-19 test negative   Assessment & Plan    Principal Problem:   Acute renal failure superimposed on stage 3 chronic kidney disease (HCC) -Baseline creatinine 1.5, presented with creatinine of 1.8, due to dehydration and diarrhea. -Patient was placed on gentle hydration creatinine improved to 1.3-> 1.6 still around baseline -Creatinine at baseline  Active Problems: Sirs with community-acquired pneumonia, likely aspiration pneumonia, dysphagia --Patient was placed on IV vancomycin, cefepime and Flagyl. -Given new diagnosis of C. difficile, changed to oral Augmentin for total 7 days of antibiotics -Per daughter, he has longtime swallowing issues.  Requested oral diet for his SNF to take him back -Continue dysphagia 1 diet with nectar thick liquids, speech therapy to follow  C. difficile diarrhea -Patient presented with diarrhea at the time of admission, C. difficile PCR  positive -Placed on oral vancomycin.  Given patient is on antibiotics for aspiration pneumonia, will increase duration of oral vancomycin to complete a course of 10 days of p.o. vanc after the Augmentin course is complete till 11/02/2018   paroxysmal atrial fibrillation -Continue Eliquis, [place] VASC score at least 5, with prior history of stroke -Restart metoprolol  Acute metabolic encephalopathy superimposed on dementia, Parkinson's disease -Currently mental status worsening due to sundowning, did not sleep well last night.  Discussed with patient's daughter, he normally gets sundowning in the hospital and takes a few days to recover.  She requested patient be sent back to the [facility] facility, patient does better in the familiar surroundings.  Will address with social work in a.m. -Restarted patient's melatonin at bedtime for sleep hygiene.  Mild acute on chronic diastolic CHF -2D echo 1/51 showed EF of 55 to 60% with grade 1 diastolic dysfunction -Lasix was held as patient was somewhat dehydrated at the time of admission with acute kidney injury, diarrhea -Restarted Lasix 20 mg daily, chest x-ray done today shows cardiomegaly with vascular congestion and interstitial edema.  Will give 1 dose of Lasix 20 mg IV x1    Malignant neoplasm of bladder ([place]), history of  non-Hodgkin lymphoma -Remote history of bladder cancer with excision only -Non-Hodgkin lymphoma status post chemo and radiation therapy, not on chemo currently, had followed with Dr Beryle Beams -Palliative medicine had been following outpatient    Hyperlipidemia -Continue statin    GERD (gastroesophageal reflux disease) -Restart PPI    BPH (benign prostatic hyperplasia) -Continue tamsulosin    Parkinson's disease ([place]), likely worsening, has dysphagia, dementia - Continue Sinemet -Palliative medicine consulted for goals of care   History of stroke -Continue Eliquis    Decubitus ulcer of sacral region, stage  1 -Wound care consulted  Generalized debility, poor functional status, dysphagia, dementia -Palliative medicine consulted, goals of care addressed, DNR, daughter requested patient at least on solid diet.  Code Status: Full code DVT Prophylaxis: Received therapeutic Lovenox as patient is currently having dysphagia issues Family Communication: Discussed in detail with the patient, all imaging results, lab results explained to the patient's daughter on the phone.   Patient's daughter requested DC in a.m. back to [facility] facility as patient does well in familiar surroundings.  She states patient is in the private room.     Disposition Plan:  will discuss with the social work in a.m.  Time Spent in minutes   25 minutes  Procedures:  None  Consultants:   Palliative medicine   Antimicrobials:   Anti-infectives (From admission, onward)   Start     Dose/Rate Route Frequency Ordered Stop   10/21/18 1000  amoxicillin-clavulanate (AUGMENTIN) 875-125 MG per tablet 1 tablet     1 tablet Oral Every 12 hours 10/20/18 2046 10/23/18 2159   10/20/18 2200  vancomycin (VANCOCIN) 1,750 mg in sodium chloride 0.9 % 500 mL IVPB  Status:  Discontinued     1,750 mg 250 mL/hr over 120 Minutes Intravenous Every 48 hours 10/19/18 0211 10/19/18 1630   10/20/18 2200  vancomycin (VANCOCIN) 50 mg/mL oral solution 125 mg     125 mg Oral 4 times daily 10/20/18 1951 11/02/18 2359   10/20/18 0600  vancomycin (VANCOCIN) IVPB 1000 mg/200 mL premix  Status:  Discontinued     1,000 mg 200 mL/hr over 60 Minutes Intravenous Every 24 hours 10/19/18 1630 10/20/18 2043   10/19/18 0800  ceFEPIme (MAXIPIME) 2 g in sodium chloride 0.9 % 100 mL IVPB  Status:  Discontinued     2 g 200 mL/hr over 30 Minutes Intravenous Every 12 hours 10/19/18 0210 10/20/18 2043   10/19/18 0600  metroNIDAZOLE (FLAGYL) IVPB 500 mg  Status:  Discontinued     500 mg 100 mL/hr over 60 Minutes Intravenous Every 8 hours 10/19/18 0133 10/20/18  2046   10/19/18 0215  vancomycin (VANCOCIN) IVPB 1000 mg/200 mL premix     1,000 mg 200 mL/hr over 60 Minutes Intravenous  Once 10/19/18 0207 10/19/18 0540   10/18/18 1900  vancomycin (VANCOCIN) IVPB 1000 mg/200 mL premix     1,000 mg 200 mL/hr over 60 Minutes Intravenous  Once 10/18/18 1855 10/19/18 0030   10/18/18 1845  ceFEPIme (MAXIPIME) 2 g in sodium chloride 0.9 % 100 mL IVPB     2 g 200 mL/hr over 30 Minutes Intravenous  Once 10/18/18 1838 10/18/18 2210   10/18/18 1845  metroNIDAZOLE (FLAGYL) IVPB 500 mg     500 mg 100 mL/hr over 60 Minutes Intravenous  Once 10/18/18 1838 10/18/18 2320   10/18/18 1845  vancomycin (VANCOCIN) IVPB 1000 mg/200 mL premix  Status:  Discontinued     1,000 mg 200 mL/hr over 60 Minutes Intravenous  Once 10/18/18 1838 10/18/18 1842         Medications  Scheduled Meds:  acetaminophen  650 mg Oral Once   amoxicillin-clavulanate  1 tablet Oral Q12H   carbidopa-levodopa  1 tablet Oral 3 times per day   Chlorhexidine Gluconate Cloth  6 each Topical Q0600   enoxaparin (LOVENOX) injection  1 mg/kg Subcutaneous Q12H   fluticasone  2 spray Each Nare Daily   furosemide  20 mg Oral Daily   Melatonin  3 mg Oral QHS   memantine  10 mg Oral BID   metoprolol tartrate  25 mg Oral BID   mupirocin ointment  1 application Nasal BID   pantoprazole  40 mg Oral Q0600   simvastatin  20 mg Oral QPM   tamsulosin  0.4 mg Oral QHS   vancomycin  125 mg Oral QID   Continuous Infusions:  PRN Meds:.acetaminophen **OR** acetaminophen, HYDROcodone-acetaminophen, ondansetron **OR** ondansetron (ZOFRAN) IV, Resource ThickenUp Clear      Subjective:   Daysean Tinkham was seen and examined today.  Somewhat confused this morning, shortness of breath with rhonchi.  Unable to provide review of systems.  Did not sleep well last night.    Objective:   Vitals:   10/21/18 1347 10/21/18 2127 10/22/18 0634 10/22/18 1349  BP:  121/76 131/77 (!) 112/52  Pulse: 98  83 91 89  Resp:  18 18 12   Temp:  (!) 97.5 F (36.4 C) (!) 97.4 F (36.3 C) (!) 97.3 F (36.3 C)  TempSrc:  Oral Oral Oral  SpO2:  96% 96% 96%  Weight:      Height:        Intake/Output Summary (Last 24 hours) at 10/22/2018 1811 Last data filed at 10/22/2018 1700 Gross per 24 hour  Intake 423.01 ml  Output --  Net 423.01 ml     Wt Readings from Last 3 Encounters:  10/19/18 85.2 kg  05/04/18 88.1 kg  04/12/18 88.3 kg   Physical Exam  General: Awake but confused, worse than yesterday, sundowning  Eyes:   HEENT:  Atraumatic, normocephalic  Cardiovascular: S1 S2 clear, no murmurs, RRR.  1+ pedal edema b/l  Respiratory: Bibasilar crackles with rhonchi  Gastrointestinal: Soft, nontender, nondistended, NBS  Ext: 1+ pedal edema bilaterally  Neuro: Not following commands  Musculoskeletal: No cyanosis, clubbing  Skin: Sacral decub stage I  Psych: Confused       Data Reviewed:  I have personally reviewed following labs and imaging studies  Micro Results Recent Results (from the past 240 hour(s))  Blood Culture (routine x 2)     Status: None (Preliminary result)   Collection Time: 10/18/18  6:36 PM   Specimen: BLOOD  Result Value Ref Range Status   Specimen Description   Final    BLOOD LEFT ANTECUBITAL Performed at [facility] [address], [place], [place] [address]    Special Requests   Final    BOTTLES DRAWN AEROBIC AND ANAEROBIC Blood Culture adequate volume Performed at [facility] [address], [place], [place] [address]    Culture   Final    NO GROWTH 4 DAYS Performed at [facility], [place] [address], [place],  [address]    Report Status PENDING  Incomplete  SARS Coronavirus 2 (CEPHEID - Performed in [place] hospital lab), Hosp Order     Status: None   Collection Time: 10/18/18  6:39 PM   Specimen: Nasopharyngeal Swab  Result Value Ref Range Status  SARS Coronavirus 2 NEGATIVE  NEGATIVE Final    Comment: (NOTE) If result is NEGATIVE SARS-CoV-2 target nucleic acids are NOT DETECTED. The SARS-CoV-2 RNA is generally detectable in upper and lower  respiratory specimens during the acute phase of infection. The lowest  concentration of SARS-CoV-2 viral copies this assay can detect is 250  copies / mL. A negative result does not preclude SARS-CoV-2 infection  and should not be used as the sole basis for treatment or other  patient management decisions.  A negative result may occur with  improper specimen collection / handling, submission of specimen other  than nasopharyngeal swab, presence of viral mutation(s) within the  areas targeted by this assay, and inadequate number of viral copies  (<250 copies / mL). A negative result must be combined with clinical  observations, patient history, and epidemiological information. If result is POSITIVE SARS-CoV-2 target nucleic acids are DETECTED. The SARS-CoV-2 RNA is generally detectable in upper and lower  respiratory specimens dur ing the acute phase of infection.  Positive  results are indicative of active infection with SARS-CoV-2.  Clinical  correlation with patient history and other diagnostic information is  necessary to determine patient infection status.  Positive results do  not rule out bacterial infection or co-infection with other viruses. If result is PRESUMPTIVE POSTIVE SARS-CoV-2 nucleic acids MAY BE PRESENT.   A presumptive positive result was obtained on the submitted specimen  and confirmed on repeat testing.  While 2019 novel coronavirus  (SARS-CoV-2) nucleic acids may be present in the submitted sample  additional confirmatory testing may be necessary for epidemiological  and / or clinical management purposes  to differentiate between  SARS-CoV-2 and other Sarbecovirus currently known to infect humans.  If clinically indicated additional testing with an alternate test  methodology [phone]) is  advised. The SARS-CoV-2 RNA is generally  detectable in upper and lower respiratory sp ecimens during the acute  phase of infection. The expected result is Negative. Fact Sheet for Patients:  StrictlyIdeas.no Fact Sheet for Healthcare Providers: BankingDealers.co.za This test is not yet approved or cleared by the [place] FDA and has been authorized for detection and/or diagnosis of SARS-CoV-2 by FDA under an Emergency Use Authorization (EUA).  This EUA will remain in effect (meaning this test can be used) for the duration of the COVID-19 declaration under Section 564(b)(1) of the Act, 21 U.S.C. section 360bbb-3(b)(1), unless the authorization is terminated or revoked sooner. Performed at [facility], [place] [address], [place], [place] [address]   Blood Culture (routine x 2)     Status: None (Preliminary result)   Collection Time: 10/18/18  6:41 PM   Specimen: BLOOD  Result Value Ref Range Status   Specimen Description   Final    BLOOD RIGHT ANTECUBITAL Performed at [facility] [address], [place], [place] [address]    Special Requests   Final    BOTTLES DRAWN AEROBIC AND ANAEROBIC Blood Culture adequate volume Performed at [facility] [address], [place], [place] [address]    Culture   Final    NO GROWTH 4 DAYS Performed at [facility], [place] [address], [place], [place] [address]    Report Status PENDING  Incomplete  Urine culture     Status: None   Collection Time: 10/18/18  7:11 PM   Specimen: In/Out Cath Urine  Result Value Ref Range Status   Specimen Description   Final    IN/OUT CATH URINE  Performed at [facility], [place] [address] [place], [place] [address]    Special Requests   Final    NONE Performed at [facility], [place] [address], [place], [place] [address]    Culture   Final    NO  GROWTH Performed at [facility], [place] [address] [place], [place] [address]    Report Status 10/20/2018 FINAL  Final  C difficile quick scan w PCR reflex     Status: Abnormal   Collection Time: 10/18/18  9:44 PM   Specimen: STOOL  Result Value Ref Range Status   C Diff antigen POSITIVE (A) NEGATIVE Final   C Diff toxin POSITIVE (A) NEGATIVE Final   C Diff interpretation Toxin producing C. difficile detected.  Final    Comment: CRITICAL RESULT CALLED TO, READ BACK BY AND VERIFIED WITH: R.DICKS,RN [id number] @1938  BY V.WILKINS Performed at [facility] [address] [place], [place] [address]   Gastrointestinal Panel by PCR , Stool     Status: None   Collection Time: 10/18/18  9:44 PM   Specimen: STOOL  Result Value Ref Range Status   Campylobacter species NOT DETECTED NOT DETECTED Final   Plesimonas shigelloides NOT DETECTED NOT DETECTED Final   Salmonella species NOT DETECTED NOT DETECTED Final   Yersinia enterocolitica NOT DETECTED NOT DETECTED Final   Vibrio species NOT DETECTED NOT DETECTED Final   Vibrio cholerae NOT DETECTED NOT DETECTED Final   Enteroaggregative E coli (EAEC) NOT DETECTED NOT DETECTED Final   Enteropathogenic E coli (EPEC) NOT DETECTED NOT DETECTED Final   Enterotoxigenic E coli (ETEC) NOT DETECTED NOT DETECTED Final   Shiga like toxin producing E coli (STEC) NOT DETECTED NOT DETECTED Final   Shigella/Enteroinvasive E coli (EIEC) NOT DETECTED NOT DETECTED Final   Cryptosporidium NOT DETECTED NOT DETECTED Final   Cyclospora cayetanensis NOT DETECTED NOT DETECTED Final   Entamoeba histolytica NOT DETECTED NOT DETECTED Final   Giardia lamblia NOT DETECTED NOT DETECTED Final   Adenovirus F40/41 NOT DETECTED NOT DETECTED Final   Astrovirus NOT DETECTED NOT DETECTED Final   Norovirus GI/GII NOT DETECTED NOT DETECTED Final   Rotavirus A NOT DETECTED NOT DETECTED Final   Sapovirus (I, II, IV, and V) NOT DETECTED NOT DETECTED Final     Comment: Performed at [facility], [place] [place], [place] [address]  MRSA PCR Screening     Status: Abnormal   Collection Time: 10/19/18  2:30 AM   Specimen: Nasopharyngeal  Result Value Ref Range Status   MRSA by PCR POSITIVE (A) NEGATIVE Final    Comment:        The GeneXpert MRSA Assay (FDA approved for NASAL specimens only), is one component of a comprehensive MRSA colonization surveillance program. It is not intended to diagnose MRSA infection nor to guide or monitor treatment for MRSA infections. RESULT CALLED TO, READ BACK BY AND VERIFIED WITH: C.GRIFFITH RN AT 0909 ON 10/19/2018 BY S.VANHOORNE Performed at [facility], [place] [address] [place], [place] [address]     Radiology Reports Ct Head Wo Contrast  Result Date: 10/18/2018 CLINICAL DATA:  Fall, head trauma EXAM: CT HEAD WITHOUT CONTRAST TECHNIQUE: Contiguous axial images were obtained from the base of the skull through the vertex without intravenous contrast. COMPARISON:  06/30/2016 FINDINGS: Brain: There is atrophy and chronic small vessel disease changes. No acute intracranial abnormality. Specifically, no hemorrhage, hydrocephalus, mass lesion, acute infarction, or significant intracranial injury. Vascular:  No hyperdense vessel or unexpected calcification. Skull: No acute calvarial abnormality. Sinuses/Orbits: Visualized paranasal sinuses and mastoids clear. Orbital soft tissues unremarkable. Other: None IMPRESSION: Atrophy, chronic microvascular disease. No acute intracranial abnormality. Electronically Signed   By: Rolm Baptise M.D.   On: 10/18/2018 19:27   Dg Chest Port 1 View  Result Date: 10/22/2018 CLINICAL DATA:  Pneumonia EXAM: PORTABLE CHEST 1 VIEW COMPARISON:  10/18/2018 FINDINGS: Cardiomegaly. Vascular congestion. Diffuse interstitial opacities throughout the lungs, likely interstitial edema. More confluent perihilar and lower lobe airspace opacities could reflect edema or  infection. Suspect small left effusion. No right effusion seen. No acute bony abnormality. Aortic atherosclerosis. IMPRESSION: Cardiomegaly with vascular congestion and interstitial edema. More confluent perihilar and lower lobe opacities could reflect edema or infection. Small left effusion. Electronically Signed   By: Rolm Baptise M.D.   On: 10/22/2018 12:08   Dg Chest Port 1 View  Result Date: 10/18/2018 CLINICAL DATA:  Fever EXAM: PORTABLE CHEST 1 VIEW COMPARISON:  04/08/2018 FINDINGS: Cardiomegaly. Vascular congestion. Diffuse interstitial prominence likely interstitial edema. Confluent airspace disease noted in the left perihilar and lower lung regions concerning for asymmetric edema or pneumonia. Suspect small left effusion. IMPRESSION: Cardiomegaly with vascular congestion and interstitial prominence, likely interstitial edema. Focal left perihilar and lower lobe lobe opacity which could reflect asymmetric edema or infection. Electronically Signed   By: Rolm Baptise M.D.   On: 10/18/2018 19:10   Dg Swallowing Func-speech Pathology  Result Date: 10/19/2018 Objective Swallowing Evaluation: Type of Study: MBS-Modified Barium Swallow Study  Patient Details Name: JULUS KELLEY MRN: [id number] Date of Birth: [birth year] Today's Date: 10/19/2018 Time: SLP Start Time (ACUTE ONLY): 1505 -SLP Stop Time (ACUTE ONLY): 6060 SLP Time Calculation (min) (ACUTE ONLY): 45 min Past Medical History: Past Medical History: Diagnosis Date  Arthritis   Barrett esophagus   Bladder cancer ([place]) dx'd [phone],[phone]  surg only  CHF (congestive heart failure) (HCC)   Colon polyps   adenomatous  Difficulty sleeping   OCCASIONALLY  Diverticulosis of colon (without mention of hemorrhage)   Frequency of urination   GERD (gastroesophageal reflux disease)   Hiatal hernia   History of bladder cancer 1981  EXCISION ONLY  History of skin cancer   BASAL CELL  Hyperlipidemia   Lymphoma, non-Hodgkin's ([place]) 2011  [place] degeneration   LEFT EYE  Multiple nodules of lung 09/16/2011  PAF (paroxysmal atrial fibrillation) ([place])   Personal history of colonic polyps 07/15/2010  tubular adenoma  Prostate cancer ([place])   patient denies  PUD (peptic ulcer disease)   Stomach cancer ([place])   NHL origin  Stroke ([place])   Weakness  Past Surgical History: Past Surgical History: Procedure Laterality Date  APPENDECTOMY    bone spur    left shoulder  CHOLECYSTECTOMY    KNEE ARTHROPLASTY Left 07/30/2015  Procedure: LEFT TOTAL KNEE ARTHROPLASTY WITH COMPUTER NAVIGATION;  Surgeon: Rod Can, MD;  Location: [facility];  Service: Orthopedics;  Laterality: Left;  LASER OF PROSTATE W/ GREEN LIGHT PVP    TENDON REPAIR    right arm  TRIGGER FINGER RELEASE    bilateral hands  WRIST SURGERY    Bilateral HPI: 85 yo male admitted from [facility] with AMS, found to have renal failure and possible pna.  Pt has h/o  ???  Parkinsons', dysphagia, pontine cva, renal disease and pna.  He is on a regular/thin diet at his facility.Pt reports he coughs "all the time" when he eats and states its  worse with food than drinks.  Subjective: pt awake in chair Assessment / Plan / Recommendation CHL IP CLINICAL IMPRESSIONS 10/19/2018 Clinical Impression Patient presents with significantly worsening dysphagia compared to during his last MBS in 02/2018 resulting in gross residuals and aspiration/penetration.  Secretions also noted that mix with oropharyngeal barium residuals.  Oropharyngeal dysphagia due to his Parkinsons and pontine stroke likely exacerbated by his acute illness.  Oral transiting discoordinated with some lingual pumping and premature spillage/oral residuals.  Pharyngeal swallow initiation was challenging with pt allowing laryngeal penetration for 2 seconds with nectar before pharyngeal swallow even triggered, thus allowing penetration and aspiration.  With mild aspiration of nectar *silent* - cued cough removed aspirates into pharynx which pt  expelled into oral cavity per SLP verbal cue before expectorating.  Penetration and aspiration occurred before, during and after the swallow.  Pt required oral suctioning frequently during MBS due to expectoration of pharyngeal retention.   Motor planning of cued swallows were difficult for pt to perform with initial swallow of puree as pt allowed bolus to be retained in pharynx/vallecular region, thus no pudding was transited into esophagus initially.  Secondary cued dry swallows to clear liquid pharyngeal retention also ineffective due to nearly absent/poor pharyngeal motility.   Gross residuals present even when swallow timing was adequate due to decreased pharyngeal and laryngeal motility.  With aspiration, pt does not initially sense and when sensation is delayed due to larger aspiration amount, bolus is too deep in trachea for him to clear.  Various postures including chin tuck and head turn were not effective to decrease residuals or prevent aspiration/penetration with solids or liquids via cup/straw.  Decreasing amount to TEASPOOON only of thin and nectar using chin tuck posture were the only boluses pt did not aspirate nor present with gross residuals.  Pt will be functionally unable to receive adequate hydration/nutrition on liquid via tsp diet.  Pt reports he would cough/expectorate even prior to admission -strength of cough/expectoration with ability to mobilize has likely allowed aspiration tolerance.   However given acute on chronic dysphagia, worsening dysphagia, deconditioning, current pna (last one 12/2017) and full code status - SLP highly recommends a palliative referral to establish his goals of care.    Pending GOC, recommend tsps ONLY of nectar, thin with chin tuck posture and multiple swallows.   Will follow up for dysphagia management, education, possibly RMST and will review importance of oral care given pt with copious secretions likely due to his Parkinsons.  Educated pt to  findings/recommendations and concerns.  He reported understanding.  SLP Visit Diagnosis Dysphagia, oropharyngeal phase (R13.12) Attention and concentration deficit following -- Frontal lobe and executive function deficit following -- Impact on safety and function Severe aspiration risk   CHL IP TREATMENT RECOMMENDATION 10/19/2018 Treatment Recommendations Therapy as outlined in treatment plan below   Prognosis 10/19/2018 Prognosis for Safe Diet Advancement Guarded Barriers to Reach Goals Time post onset;Severity of deficits Barriers/Prognosis Comment -- CHL IP DIET RECOMMENDATION 10/19/2018 SLP Diet Recommendations (No Data) Liquid Administration via -- Medication Administration -- Compensations Slow rate;Small sips/bites;Chin tuck Postural Changes --   CHL IP OTHER RECOMMENDATIONS 10/19/2018 Recommended Consults -- Oral Care Recommendations Oral care QID Other Recommendations Order thickener from pharmacy   CHL IP FOLLOW UP RECOMMENDATIONS 10/19/2018 Follow up Recommendations (No Data)   CHL IP FREQUENCY AND DURATION 10/19/2018 Speech Therapy Frequency (ACUTE ONLY) min 2x/week Treatment Duration 2 weeks      CHL IP ORAL PHASE 10/19/2018 Oral Phase Impaired Oral -  Pudding Teaspoon -- Oral - Pudding Cup -- Oral - Honey Teaspoon -- Oral - Honey Cup -- Oral - Nectar Teaspoon Weak lingual manipulation;Lingual pumping;Premature spillage Oral - Nectar Cup Reduced posterior propulsion Oral - Nectar Straw Weak lingual manipulation;Reduced posterior propulsion Oral - Thin Teaspoon Weak lingual manipulation;Lingual/palatal residue;Reduced posterior propulsion;Premature spillage Oral - Thin Cup Lingual/palatal residue;Reduced posterior propulsion;Premature spillage Oral - Thin Straw Lingual/palatal residue;Weak lingual manipulation;Reduced posterior propulsion;Premature spillage Oral - Puree Weak lingual manipulation;Reduced posterior propulsion;Lingual/palatal residue Oral - Mech Soft Reduced posterior propulsion;Weak lingual  manipulation;Impaired mastication;Premature spillage;Lingual/palatal residue Oral - Regular -- Oral - Multi-Consistency -- Oral - Pill -- Oral Phase - Comment --  CHL IP PHARYNGEAL PHASE 10/19/2018 Pharyngeal Phase Impaired Pharyngeal- Pudding Teaspoon -- Pharyngeal -- Pharyngeal- Pudding Cup -- Pharyngeal -- Pharyngeal- Honey Teaspoon -- Pharyngeal -- Pharyngeal- Honey Cup -- Pharyngeal -- Pharyngeal- Nectar Teaspoon Delayed swallow initiation-vallecula;Reduced anterior laryngeal mobility;Reduced laryngeal elevation;Reduced airway/laryngeal closure;Reduced tongue base retraction;Penetration/Aspiration during swallow;Penetration/Apiration after swallow;Pharyngeal residue - valleculae Pharyngeal Material enters airway, passes BELOW cords then ejected out;Material enters airway, passes BELOW cords without attempt by patient to eject out (silent aspiration) Pharyngeal- Nectar Cup Delayed swallow initiation-vallecula;Reduced epiglottic inversion;Reduced laryngeal elevation;Reduced airway/laryngeal closure;Reduced tongue base retraction;Pharyngeal residue - valleculae;Compensatory strategies attempted (with notebox) Pharyngeal -- Pharyngeal- Nectar Straw Reduced epiglottic inversion;Reduced laryngeal elevation;Reduced airway/laryngeal closure;Reduced tongue base retraction;Penetration/Aspiration during swallow;Penetration/Apiration after swallow;Moderate aspiration;Pharyngeal residue - valleculae;Compensatory strategies attempted (with notebox) Pharyngeal Material enters airway, passes BELOW cords and not ejected out despite cough attempt by patient;Material enters airway, passes BELOW cords without attempt by patient to eject out (silent aspiration) Pharyngeal- Thin Teaspoon Delayed swallow initiation-vallecula;Reduced epiglottic inversion;Reduced anterior laryngeal mobility;Reduced laryngeal elevation;Reduced airway/laryngeal closure;Reduced tongue base retraction;Pharyngeal residue - valleculae;Compensatory strategies  attempted (with notebox) Pharyngeal -- Pharyngeal- Thin Cup Delayed swallow initiation-vallecula;Reduced epiglottic inversion;Reduced laryngeal elevation;Reduced airway/laryngeal closure;Reduced tongue base retraction;Penetration/Aspiration before swallow;Penetration/Apiration after swallow;Pharyngeal residue - valleculae Pharyngeal Material enters airway, passes BELOW cords without attempt by patient to eject out (silent aspiration) Pharyngeal- Thin Straw Delayed swallow initiation-vallecula;Reduced epiglottic inversion;Reduced anterior laryngeal mobility;Reduced laryngeal elevation;Reduced airway/laryngeal closure;Reduced tongue base retraction;Penetration/Aspiration before swallow;Penetration/Apiration after swallow;Pharyngeal residue - valleculae Pharyngeal Material enters airway, passes BELOW cords and not ejected out despite cough attempt by patient;Material enters airway, passes BELOW cords without attempt by patient to eject out (silent aspiration) Pharyngeal- Puree Delayed swallow initiation-vallecula;Reduced pharyngeal peristalsis;Reduced epiglottic inversion;Reduced anterior laryngeal mobility;Reduced laryngeal elevation;Reduced airway/laryngeal closure;Reduced tongue base retraction;Pharyngeal residue - valleculae Pharyngeal -- Pharyngeal- Mechanical Soft Delayed swallow initiation-vallecula;Reduced pharyngeal peristalsis;Reduced epiglottic inversion;Reduced anterior laryngeal mobility;Reduced laryngeal elevation;Reduced airway/laryngeal closure;Reduced tongue base retraction;Pharyngeal residue - valleculae Pharyngeal -- Pharyngeal- Regular -- Pharyngeal -- Pharyngeal- Multi-consistency -- Pharyngeal -- Pharyngeal- Pill -- Pharyngeal -- Pharyngeal Comment weak initial swallow with pudding that resulted in 0% transiting into esophagus, 2nd swallow effective to clear approx 50%, chin tuck helpful with tsp amounts to keep pt from aspirating as all of the bolus is retained in the pharynx before swallow  triggered, larger amounts were aspirated, pt required cues to cough/"hock" to expectorate barium throughout the study  CHL IP CERVICAL ESOPHAGEAL PHASE 10/19/2018 Cervical Esophageal Phase Impaired Pudding Teaspoon -- Pudding Cup -- Honey Teaspoon -- Honey Cup -- Nectar Teaspoon -- Nectar Cup -- Nectar Straw -- Thin Teaspoon -- Thin Cup -- Thin Straw -- Puree -- Mechanical Soft -- Regular -- Multi-consistency -- Pill -- Cervical Esophageal Comment appearance of prominent cricopharyngeus did not impair barium flow Luanna Salk, MS [facility] SLP Acute Rehab Services Pager [phone] Office [phone] Macario Golds 10/19/2018, 5:45 PM  Lab Data:  CBC: Recent Labs  Lab 10/18/18 1905 10/19/18 0611 10/20/18 0559 10/21/18 0527 10/22/18 0458  WBC 9.9 7.1 11.3* 7.7 5.4  NEUTROABS 8.3*  --   --   --   --   HGB 13.9 12.5* 12.8* 12.7* 11.4*  HCT 47.3 41.0 43.2 41.6 37.7*  MCV 90.4 89.9 90.8 88.9 88.3  PLT 204 152 142* 154 741   Basic Metabolic Panel: Recent Labs  Lab 10/18/18 1905 10/19/18 0611 10/20/18 0559 10/21/18 0527 10/22/18 0458  NA 138 138 139 139 140  K 4.8 4.3 4.5 3.8 3.2*  CL 103 109 110 109 112*  CO2 25 21* 19* 22 18*  GLUCOSE 116* 94 76 91 94  BUN 24* 23 24* 27* 26*  CREATININE 1.87* 1.53* 1.39* 1.69* 1.58*  CALCIUM 8.6* 8.0* 8.0* 8.3* 8.1*  MG  --  1.8  --   --   --   PHOS  --  3.1  --   --   --    GFR: Estimated Creatinine Clearance: 34.4 mL/min (A) (by C-G formula based on SCr of 1.58 mg/dL (H)). Liver Function Tests: Recent Labs  Lab 10/18/18 1905 10/19/18 0611  AST 19 17  ALT 9 9  ALKPHOS 42 32*  BILITOT 0.5 0.5  PROT 7.8 6.4*  ALBUMIN 2.8* 2.3*   No results for input(s): LIPASE, AMYLASE in the last 168 hours. No results for input(s): AMMONIA in the last 168 hours. Coagulation Profile: Recent Labs  Lab 10/18/18 1905  INR 1.6*   Cardiac Enzymes: No results for input(s): CKTOTAL, CKMB, CKMBINDEX, TROPONINI in the last 168 hours. BNP  (last 3 results) No results for input(s): PROBNP in the last 8760 hours. HbA1C: No results for input(s): HGBA1C in the last 72 hours. CBG: No results for input(s): GLUCAP in the last 168 hours. Lipid Profile: No results for input(s): CHOL, HDL, LDLCALC, TRIG, CHOLHDL, LDLDIRECT in the last 72 hours. Thyroid Function Tests: No results for input(s): TSH, T4TOTAL, FREET4, T3FREE, THYROIDAB in the last 72 hours. Anemia Panel: No results for input(s): VITAMINB12, FOLATE, FERRITIN, TIBC, IRON, RETICCTPCT in the last 72 hours. Urine analysis:    Component Value Date/Time   COLORURINE YELLOW 10/18/2018 1911   APPEARANCEUR HAZY (A) 10/18/2018 1911   LABSPEC 1.015 10/18/2018 1911   PHURINE 5.0 10/18/2018 1911   GLUCOSEU NEGATIVE 10/18/2018 1911   HGBUR NEGATIVE 10/18/2018 1911   BILIRUBINUR NEGATIVE 10/18/2018 1911   KETONESUR NEGATIVE 10/18/2018 1911   PROTEINUR NEGATIVE 10/18/2018 1911   UROBILINOGEN 1.0 07/07/2011 1942   NITRITE NEGATIVE 10/18/2018 1911   LEUKOCYTESUR NEGATIVE 10/18/2018 1911     Montie Swiderski M.D. [facility] 10/22/2018, 6:11 PM  Pager: [phone] Between 7am to 7pm - call Pager - [phone]  After 7pm go to [email or web address] - password [email or web address]  Call night coverage person covering after 7pm

## 2018-10-22 NOTE — Progress Notes (Signed)
Daily Progress Note   Patient Name: Jason [name]       Date: 10/22/2018 DOB: 05-16-1927  Age: 85 y.o. MRN#: [id number] Attending Physician: Mendel Corning, MD Primary Care Physician: [name], MD Admit Date: 10/18/2018  Reason for Consultation/Follow-up: Establishing goals of care  Subjective: I saw and examined Jason [name] today.  He is awake but remains confused today.    CSW attempted to reach out to [name] yesterday.  I called and talked with [facility] team.  Discussed that goals is to return to [name] as soon as possible.  Family wants him to be comfortable in familiar surroundings.  They are agreeable to changing to whatever diet is necessary for him to transition back as he is going to aspirate regardless of diet and he will be most comfortable in familiar surroundings.  Family is agreeable to hospice following as well.  Case manager will reach out to [name] to discuss.  I asked her to let me know what barriers are to his return (change diet, etc) so we can address them.  Length of Stay: 3  Current Medications: Scheduled Meds:  . acetaminophen  650 mg Oral Once  . amoxicillin-clavulanate  1 tablet Oral Q12H  . carbidopa-levodopa  1 tablet Oral 3 times per day  . Chlorhexidine Gluconate Cloth  6 each Topical Q0600  . enoxaparin (LOVENOX) injection  1 mg/kg Subcutaneous Q12H  . fluticasone  2 spray Each Nare Daily  . furosemide  20 mg Oral Daily  . Melatonin  3 mg Oral QHS  . memantine  10 mg Oral BID  . metoprolol tartrate  25 mg Oral BID  . mupirocin ointment  1 application Nasal BID  . pantoprazole  40 mg Oral Q0600  . simvastatin  20 mg Oral QPM  . tamsulosin  0.4 mg Oral QHS  . vancomycin  125 mg Oral QID    Continuous Infusions:   PRN Meds:  acetaminophen **OR** acetaminophen, HYDROcodone-acetaminophen, ondansetron **OR** ondansetron (ZOFRAN) IV, Resource ThickenUp Clear  Physical Exam         General: Alert, awake, in no acute distress. Mild confusion. Heart: Regular rate and rhythm. No murmur appreciated. Lungs: Good air movement, clear Abdomen: Soft, nontender, nondistended, positive bowel sounds.  Ext: No significant edema Skin: Warm and dry Neuro: Grossly intact, nonfocal.  Vital Signs: BP (!) 112/52 (BP Location: Right Arm)   Pulse 89   Temp (!) 97.3 F (36.3 C) (Oral)   Resp 12   Ht '5\' 11"'  (1.803 m)   Wt 85.2 kg   SpO2 96%   BMI 26.20 kg/m  SpO2: SpO2: 96 % O2 Device: O2 Device: Room Air O2 Flow Rate: O2 Flow Rate (L/min): 0 L/min  Intake/output summary:   Intake/Output Summary (Last 24 hours) at 10/22/2018 1948 Last data filed at 10/22/2018 1700 Gross per 24 hour  Intake 423.01 ml  Output -  Net 423.01 ml   LBM: Last BM Date: 10/21/18 Baseline Weight: Weight: 85.2 kg Most recent weight: Weight: 85.2 kg       Palliative Assessment/Data:    Flowsheet Rows     Most Recent Value  Intake Tab  Referral Department  Hospitalist  Unit at Time of Referral  Med/Surg Unit  Palliative Care Primary Diagnosis  Sepsis/Infectious Disease  Date Notified  10/19/18  Palliative Care Type  Return patient Palliative Care  Reason for referral  Clarify Goals of Care  Date of Admission  10/18/18  Date first seen by Palliative Care  10/20/18  # of days Palliative referral response time  1 Day(s)  # of days IP prior to Palliative referral  1  Clinical Assessment  Palliative Performance Scale Score  60%  Psychosocial & Spiritual Assessment  Palliative Care Outcomes  Patient/Family meeting held?  Yes  Who was at the meeting?  daughter via phone  Palliative Care Outcomes  Clarified goals of care, Changed CPR status, Completed durable DNR      Patient Active Problem List   Diagnosis Date Noted  . Dehydration  10/18/2018  . SIRS (systemic inflammatory response syndrome) ([place]) 10/18/2018  . Decubitus ulcer of sacral region, stage 1 10/18/2018  . Memory loss 05/14/2018  . Parkinsonism ([place]) 05/14/2018  . History of stroke 05/04/2018  . Dilated aortic root ([place]) 05/04/2018  . Palliative care by specialist   . Goals of care, counseling/discussion   . HCAP (healthcare-associated pneumonia) 12/22/2017  . Sepsis ([place]) 12/22/2017  . Chronic diastolic (congestive) heart failure ([place]) 12/22/2017  . Aspiration pneumonia ([place]) 12/22/2017  . Diarrhea 12/22/2017  . Fall at home 10/19/2016  . Gait abnormality 09/05/2016  . Parkinson's disease ([place]) 09/05/2016  . CVA (cerebral vascular accident) ([place]) 07/01/2016  . Dysarthria   . History of cancer   . TIA (transient ischemic attack) 06/30/2016  . Hemiplegia affecting dominant side ([place]) 06/30/2016  . History of bladder cancer   . Dysphagia   . Hyperglycemia   . Acute renal failure superimposed on stage 3 chronic kidney disease ([place])   . Acute blood loss anemia   . Leukocytosis   . Weakness   . Community acquired pneumonia 06/16/2016  . Unresponsiveness   . CAP (community acquired pneumonia) 06/15/2016  . Paroxysmal atrial fibrillation ([place]) 06/15/2016  . Pressure injury of skin 06/15/2016  . Lobar pneumonia ([place])   . Acute congestive heart failure ([place])   . Primary osteoarthritis of left knee 07/30/2015  . BPH (benign prostatic hyperplasia) 07/16/2012  . Multiple nodules of lung 09/16/2011  . GERD (gastroesophageal reflux disease) 06/14/2011  . Constipation, chronic 06/14/2011  . NHL (non-Hodgkin's lymphoma) ([place]) 01/03/2010  . Malignant neoplasm of bladder ([place]) 05/28/2007  . Hyperlipidemia 05/28/2007  . HIATAL HERNIA 05/28/2007  . BARRETT'S ESOPHAGUS, HX OF 05/28/2007    Palliative Care Assessment & Plan   Patient Profile: 85 y.o. male  with  past medical history of chronic diastolic heart failure, A. fib on Eliquis, CKD, non-Hodgkin's  lymphoma, Parkinson's, aspiration pneumonia, stroke, bladder cancer, GERD, and prostate cancer admitted on 10/18/2018 with [facility]s related to community-acquired pneumonia with high concern for aspiration, dysphasia, and acute renal failure.  Palliative consulted for goals of care.   Assessment: Patient Active Problem List   Diagnosis Date Noted  . Dehydration 10/18/2018  . SIRS (systemic inflammatory response syndrome) ([place]) 10/18/2018  . Decubitus ulcer of sacral region, stage 1 10/18/2018  . Memory loss 05/14/2018  . Parkinsonism ([place]) 05/14/2018  . History of stroke 05/04/2018  . Dilated aortic root ([place]) 05/04/2018  . Palliative care by specialist   . Goals of care, counseling/discussion   . HCAP (healthcare-associated pneumonia) 12/22/2017  . Sepsis ([place]) 12/22/2017  . Chronic diastolic (congestive) heart failure ([place]) 12/22/2017  . Aspiration pneumonia ([place]) 12/22/2017  . Diarrhea 12/22/2017  . Fall at home 10/19/2016  . Gait abnormality 09/05/2016  . Parkinson's disease ([place]) 09/05/2016  . CVA (cerebral vascular accident) ([place]) 07/01/2016  . Dysarthria   . History of cancer   . TIA (transient ischemic attack) 06/30/2016  . Hemiplegia affecting dominant side ([place]) 06/30/2016  . History of bladder cancer   . Dysphagia   . Hyperglycemia   . Acute renal failure superimposed on stage 3 chronic kidney disease ([place])   . Acute blood loss anemia   . Leukocytosis   . Weakness   . Community acquired pneumonia 06/16/2016  . Unresponsiveness   . CAP (community acquired pneumonia) 06/15/2016  . Paroxysmal atrial fibrillation ([place]) 06/15/2016  . Pressure injury of skin 06/15/2016  . Lobar pneumonia ([place])   . Acute congestive heart failure ([place])   . Primary osteoarthritis of left knee 07/30/2015  . BPH (benign prostatic hyperplasia) 07/16/2012  . Multiple nodules of lung 09/16/2011  . GERD (gastroesophageal reflux disease) 06/14/2011  . Constipation, chronic 06/14/2011  . NHL  (non-Hodgkin's lymphoma) ([place]) 01/03/2010  . Malignant neoplasm of bladder ([place]) 05/28/2007  . Hyperlipidemia 05/28/2007  . HIATAL HERNIA 05/28/2007  . BARRETT'S ESOPHAGUS, HX OF 05/28/2007     Recommendations/Plan: - DNR/DNI - Continue current interventions, including continuation of current antibiotics for PNA and c-diff.  Plan is to treat this pneumonia, but with understanding that he will aspirate again at some point in the future.  No plan for PEG at any point in the future. - Overall goal is to get back to [name]. Family is agreeable to hospice and I feel he should qualify with his decline and worsened swallow function. TOC team to reach out to [name] to see if they can meet his needs and what modifications to care plan need to be met to allow him to return.  Goals of Care and Additional Recommendations:  Limitations on Scope of Treatment: Avoid Hospitalization  Code Status:    Code Status Orders  (From admission, onward)         Start     Ordered   10/20/18 1325  Do not attempt resuscitation (DNR)  Continuous    Question Answer Comment  In the event of cardiac or respiratory ARREST Do not call a "code blue"   In the event of cardiac or respiratory ARREST Do not perform Intubation, CPR, defibrillation or ACLS   In the event of cardiac or respiratory ARREST Use medication by any route, position, wound care, and other measures to relive pain and suffering. May use oxygen, suction and manual treatment of airway obstruction as needed for  comfort.      10/20/18 1324        Code Status History    Date Active Date Inactive Code Status Order ID Comments User Context   10/19/2018 0134 10/20/2018 1324 Full Code [id number]  Toy Baker, MD Inpatient   12/25/2017 0956 12/27/2017 2341 DNR [id number]  Domenic Polite, MD Inpatient   12/22/2017 2041 12/25/2017 0956 Full Code [id number]  Ivor Costa, MD ED   10/19/2016 1558 10/28/2016 1942 Full Code [id number]  Vivia Ewing,  PA-C ED   06/30/2016 2102 07/07/2016 1814 Full Code [id number]  Norval Morton, MD ED   06/15/2016 1038 06/20/2016 1819 Full Code [id number]  Johnney Ou ED   Advance Care Planning Activity       Prognosis:   < 6 months most likely.  He remains at high risk for recurrent aspiration as demonstrated by worsened MBS this admission.  While it has been 10 months since last admission for aspiration, this is something that progresses in severity and frequency.  As he continues to aspirate with reinitiation of diet, I believe that there is a high likelihood we could be in the last 6 months of life if his disease follows its natural course and I would recommend election of his hospice benefits if possible.  Discharge Planning: Hopefully back to [name] with hospice- Discussed with transition of care team who will reach out to [facility] was discussed with care management  Thank you for allowing the Palliative Medicine Team to assist in the care of this patient.   Time In: 1440 Time Out: 1500 Total Time 20 Prolonged Time Billed No      Greater than 50%  of this time was spent counseling and coordinating care related to the above assessment and plan.  Micheline Rough, MD  Please contact Palliative Medicine Team phone at [phone] for questions and concerns.

## 2018-10-22 NOTE — Progress Notes (Signed)
Spoke with daughter, [name], regarding plan of care. Verbalized understanding. Daughter also asked to speak with Dr. Tana Coast when she was available. Dr. Tana Coast made aware.

## 2018-10-22 NOTE — Care Management Important Message (Signed)
Important Message  Patient Details IM Letter given to Cookie McGibboney RN to present to the [place] Name: SAVVA BEAMER MRN: [id number] Date of Birth: December 02, 1927   Medicare Important Message Given:  Yes     Kerin Salen 10/22/2018, 9:54 AM

## 2018-10-23 DIAGNOSIS — Z515 Encounter for palliative care: Secondary | ICD-10-CM

## 2018-10-23 DIAGNOSIS — R1310 Dysphagia, unspecified: Secondary | ICD-10-CM

## 2018-10-23 DIAGNOSIS — L89151 Pressure ulcer of sacral region, stage 1: Secondary | ICD-10-CM

## 2018-10-23 DIAGNOSIS — I5032 Chronic diastolic (congestive) heart failure: Secondary | ICD-10-CM

## 2018-10-23 LAB — CULTURE, BLOOD (ROUTINE X 2)
Culture: NO GROWTH
Culture: NO GROWTH
Special Requests: ADEQUATE
Special Requests: ADEQUATE

## 2018-10-23 LAB — SARS CORONAVIRUS 2 BY RT PCR (HOSPITAL ORDER, PERFORMED IN ~~LOC~~ HOSPITAL LAB): SARS Coronavirus 2: NEGATIVE

## 2018-10-23 MED ORDER — FUROSEMIDE 20 MG PO TABS: 20.0000 mg | ORAL_TABLET | Freq: Every day | ORAL | 3 refills | Status: DC

## 2018-10-23 MED ORDER — APIXABAN 5 MG PO TABS: 5.0000 mg | ORAL_TABLET | Freq: Two times a day (BID) | ORAL | Status: DC

## 2018-10-23 MED ORDER — APIXABAN 5 MG PO TABS
5.0000 mg | ORAL_TABLET | Freq: Two times a day (BID) | ORAL | Status: DC
  Administered 2018-10-23 – 2018-10-24 (×2): 5 mg via ORAL
  Filled 2018-10-23 (×2): qty 1

## 2018-10-23 MED ORDER — METOPROLOL TARTRATE 25 MG PO TABS
12.5000 mg | ORAL_TABLET | Freq: Two times a day (BID) | ORAL | Status: DC
  Administered 2018-10-24: 12.5 mg via ORAL
  Filled 2018-10-23: qty 1

## 2018-10-23 MED ORDER — VANCOMYCIN 50 MG/ML ORAL SOLUTION: 125.0000 mg | Freq: Four times a day (QID) | ORAL | 0 refills | Status: DC

## 2018-10-23 MED ORDER — METOPROLOL TARTRATE 25 MG PO TABS: 12.5000 mg | ORAL_TABLET | Freq: Two times a day (BID) | ORAL | 3 refills | Status: DC

## 2018-10-23 MED ORDER — AMOXICILLIN-POT CLAVULANATE 875-125 MG PO TABS: 1.0000 | ORAL_TABLET | Freq: Two times a day (BID) | ORAL | 0 refills | Status: AC

## 2018-10-23 MED ORDER — AMOXICILLIN-POT CLAVULANATE 875-125 MG PO TABS
1.0000 | ORAL_TABLET | Freq: Two times a day (BID) | ORAL | Status: DC
  Administered 2018-10-23 – 2018-10-24 (×2): 1 via ORAL
  Filled 2018-10-23 (×2): qty 1

## 2018-10-23 NOTE — Discharge Summary (Addendum)
Physician Discharge Summary   Patient ID: SHIA EBER MRN: [id number] DOB/AGE: 83-Oct-1929 83 y.o.  Admit date: 10/18/2018 Discharge date: 10/23/2018  Primary Care Physician:  [name], MD   Recommendations for Outpatient Follow-up:  1. Follow up with PCP in 1-2 weeks 2. COVID-19 test negative, repeated on 10/23/2018 and negative again  Home Health: Patient returning to assisted living facility Equipment/Devices:   Discharge Condition: Overall poor prognosis CODE STATUS: DNR Diet recommendation: Patient has significant aspiration and dysphagia, currently on pleasure feeds.   Discharge Diagnoses:     Acute renal failure superimposed on stage III chronic kidney disease . SIRS (systemic inflammatory response syndrome) (HCC) likely due to aspiration pneumonia Dysphagia with chronic aspiration . C. difficile diarrhea . Malignant neoplasm of bladder ([place]) . Hyperlipidemia . NHL (non-Hodgkin's lymphoma) ([place]) . GERD (gastroesophageal reflux disease) . BPH (benign prostatic hyperplasia) . Paroxysmal atrial fibrillation (HCC) . Parkinson's disease ([place]) . Acute on chronic diastolic (congestive) heart failure ([place]) . Dehydration . Decubitus ulcer of sacral region, stage 1   Consults: Palliative medicine   Allergies:  No Known Allergies   DISCHARGE MEDICATIONS: Allergies as of 10/24/2018   No Known Allergies     Medication List    STOP taking these medications   COLACE PO   magnesium oxide 400 MG tablet Commonly known as: MAG-OX   traMADol 50 MG tablet Commonly known as: ULTRAM     TAKE these medications   amoxicillin-clavulanate 875-125 MG tablet Commonly known as: Augmentin Take 1 tablet by mouth 2 (two) times daily for 2 days.   calcium carbonate 500 MG chewable tablet Commonly known as: TUMS - dosed in mg elemental calcium Chew 2 tablets by mouth every 8 (eight) hours.   carbidopa-levodopa 25-100 MG tablet Commonly known as: SINEMET  IR Take 1 tablet by mouth 3 (three) times daily. Take at 8am, 12noon, 5pm   Eliquis 5 MG Tabs tablet Generic drug: apixaban Take 5 mg by mouth 2 (two) times daily.   EQL Vitamin D3 25 MCG (1000 UT) tablet Generic drug: Cholecalciferol Take 2,000 Units by mouth daily. Take 2 tabs (2000 units total) once a day   esomeprazole 40 MG capsule Commonly known as: NEXIUM TAKE 1 CAPSULE (40 MG TOTAL) BY MOUTH 2 (TWO) TIMES DAILY BEFORE A MEAL.   ferrous sulfate 325 (65 FE) MG tablet Take 325 mg by mouth daily with breakfast.   fluticasone 50 MCG/ACT nasal spray Commonly known as: FLONASE Place 2 sprays into both nostrils daily.   furosemide 20 MG tablet Commonly known as: LASIX Take 1 tablet (20 mg total) by mouth daily. What changed:   medication strength  how much to take   ICAPS LUTEIN-ZEAXANTHIN PO Take 1 tablet by mouth 2 (two) times daily.   Melatonin 3 MG Tabs Take 3 mg by mouth at bedtime.   memantine 10 MG tablet Commonly known as: Namenda Take 1 tablet (10 mg total) by mouth 2 (two) times daily.   metoprolol tartrate 25 MG tablet Commonly known as: LOPRESSOR Take 0.5 tablets (12.5 mg total) by mouth 2 (two) times daily. What changed: how much to take   potassium chloride SA 20 MEQ tablet Commonly known as: K-DUR Take 2 tablets (40 mEq total) by mouth daily. What changed:   how much to take  when to take this  additional instructions   simvastatin 20 MG tablet Commonly known as: ZOCOR Take 20 mg by mouth every evening.   tamsulosin 0.4 MG Caps capsule Commonly  known as: FLOMAX Take 0.4 mg by mouth at bedtime.   vancomycin 50 mg/mL  oral solution Commonly known as: VANCOCIN Take 2.5 mLs (125 mg total) by mouth 4 (four) times daily for 12 days.        Brief H and P: For complete details please refer to admission H and P, but in brief Patient is a 85 year old male with history of chronic diastolic CHF, paroxysmal A. fib on Eliquis, CKD stage III,  non-Hodgkin lymphoma, Parkinson's disease, history of aspiration pneumonia, stroke, bladder cancer, GERD, prostate CA, peptic ulcer disease presented with mechanical fall.  Per admission history, patient was rushing to get to the bathroom due to diarrhea however was so weak and hence fell.  He could not get up due to weakness, denied any injuries.  Patient also reported fevers. COVID-19 test negative   Hospital Course:  Acute renal failure superimposed on stage 3 chronic kidney disease (HCC) -Baseline creatinine 1.5, presented with creatinine of 1.8, due to dehydration and diarrhea. -Patient was placed on gentle hydration hence restarted -creatinine 1.5 now, patient now starting to have some volume overload, hence restarted on Lasix 20 mg daily -Creatinine now at baseline   Sirs with community-acquired pneumonia, likely aspiration pneumonia, dysphagia --Patient was placed on IV vancomycin, cefepime and Flagyl. -Given new diagnosis of C. difficile, changed to oral Augmentin for total 7 days of antibiotics, 2 more days of Augmentin -Per daughter, he has longtime swallowing issues.   -Patient was followed by speech therapy while inpatient, per speech eval, patient was placed on dysphagia 1 diet with nectar thick liquids however he continues to have chronic aspiration.  Daughter understands the risks of aspiration and worsening pneumonia, acute respiratory failure and death.  Palliative care was consulted, patient was placed on DNR status per daughter's wishes, she wants pleasure feeds.   C. difficile diarrhea -Patient presented with diarrhea at the time of admission, C. difficile PCR positive -Placed on oral vancomycin.  Given patient is on antibiotics for aspiration pneumonia, will increase duration of oral vancomycin to complete a course of 10 days of p.o. vanc after the Augmentin course is complete -patient to follow up with PCP after completion of PO vanc; any further testing or abx to be  determined by PCP   paroxysmal atrial fibrillation -Continue Eliquis, [place] VASC score at least 5, with prior history of stroke -BP borderline, metoprolol decreased to 12.5 mg twice daily  Acute metabolic encephalopathy superimposed on dementia, Parkinson's disease -Mental status somewhat better today, much more alert and awake.  Discussed with patient's daughter, he normally gets sundowning in the hospital and takes a few days to recover.  She requested patient be sent back to the [facility] facility, patient does better in the familiar surroundings.   -Restarted patient's melatonin at bedtime for sleep hygiene.  Mild acute on chronic diastolic CHF -2D echo 3/22 showed EF of 55 to 60% with grade 1 diastolic dysfunction -Lasix was held as patient was somewhat dehydrated at the time of admission with acute kidney injury, diarrhea -Restarted Lasix 20 mg daily, chest x-ray done on 7/20 showed cardiomegaly with vascular congestion and interstitial edema.      Malignant neoplasm of bladder ([place]), history of non-Hodgkin lymphoma -Remote history of bladder cancer with excision only -Non-Hodgkin lymphoma status post chemo and radiation therapy, not on chemo currently, had followed with Dr Beryle Beams -Palliative medicine had been following outpatient    Hyperlipidemia -Continue statin    GERD (gastroesophageal reflux disease) -Restart PPI  BPH (benign prostatic hyperplasia) -Continue tamsulosin    Parkinson's disease ([place]), likely worsening, has dysphagia, dementia - Continue Sinemet -Palliative medicine consulted for goals of care   History of stroke -Continue Eliquis    Decubitus ulcer of sacral region, stage 1 -Wound care consulted  Generalized debility, poor functional status, dysphagia, dementia -Palliative medicine consulted, goals of care addressed, DNR, daughter requested patient on comfort diet.    Day of Discharge S: Much more alert and awake, no acute  complaints or issues overnight.  BP 95/74 (BP Location: Left Arm)   Pulse 87   Temp 98.1 F (36.7 C) (Oral)   Resp 19   Ht 5\' 11"  (1.803 m)   Wt 85.2 kg   SpO2 96%   BMI 26.20 kg/m   Physical Exam: General: Alert and awake oriented x2, not in any acute distress. HEENT: anicteric sclera, pupils reactive to light and accommodation CVS: S1-S2 clear no murmur rubs or gallops Chest: Bilateral scattered rhonchi Abdomen: soft nontender, nondistended, normal bowel sounds Extremities: no cyanosis, clubbing, 1+ edema noted bilaterally Neuro: No new FND's   The results of significant diagnostics from this hospitalization (including imaging, microbiology, ancillary and laboratory) are listed below for reference.      Procedures/Studies:  Ct Head Wo Contrast  Result Date: 10/18/2018 CLINICAL DATA:  Fall, head trauma EXAM: CT HEAD WITHOUT CONTRAST TECHNIQUE: Contiguous axial images were obtained from the base of the skull through the vertex without intravenous contrast. COMPARISON:  06/30/2016 FINDINGS: Brain: There is atrophy and chronic small vessel disease changes. No acute intracranial abnormality. Specifically, no hemorrhage, hydrocephalus, mass lesion, acute infarction, or significant intracranial injury. Vascular: No hyperdense vessel or unexpected calcification. Skull: No acute calvarial abnormality. Sinuses/Orbits: Visualized paranasal sinuses and mastoids clear. Orbital soft tissues unremarkable. Other: None IMPRESSION: Atrophy, chronic microvascular disease. No acute intracranial abnormality. Electronically Signed   By: Rolm Baptise M.D.   On: 10/18/2018 19:27   Dg Chest Port 1 View  Result Date: 10/22/2018 CLINICAL DATA:  Pneumonia EXAM: PORTABLE CHEST 1 VIEW COMPARISON:  10/18/2018 FINDINGS: Cardiomegaly. Vascular congestion. Diffuse interstitial opacities throughout the lungs, likely interstitial edema. More confluent perihilar and lower lobe airspace opacities could reflect edema  or infection. Suspect small left effusion. No right effusion seen. No acute bony abnormality. Aortic atherosclerosis. IMPRESSION: Cardiomegaly with vascular congestion and interstitial edema. More confluent perihilar and lower lobe opacities could reflect edema or infection. Small left effusion. Electronically Signed   By: Rolm Baptise M.D.   On: 10/22/2018 12:08   Dg Chest Port 1 View  Result Date: 10/18/2018 CLINICAL DATA:  Fever EXAM: PORTABLE CHEST 1 VIEW COMPARISON:  04/08/2018 FINDINGS: Cardiomegaly. Vascular congestion. Diffuse interstitial prominence likely interstitial edema. Confluent airspace disease noted in the left perihilar and lower lung regions concerning for asymmetric edema or pneumonia. Suspect small left effusion. IMPRESSION: Cardiomegaly with vascular congestion and interstitial prominence, likely interstitial edema. Focal left perihilar and lower lobe lobe opacity which could reflect asymmetric edema or infection. Electronically Signed   By: Rolm Baptise M.D.   On: 10/18/2018 19:10   Dg Swallowing Func-speech Pathology  Result Date: 10/19/2018 Objective Swallowing Evaluation: Type of Study: MBS-Modified Barium Swallow Study  Patient Details Name: MACHI WHITTAKER MRN: [id number] Date of Birth: [birth year] Today's Date: 10/19/2018 Time: SLP Start Time (ACUTE ONLY): 1505 -SLP Stop Time (ACUTE ONLY): 1550 SLP Time Calculation (min) (ACUTE ONLY): 45 min Past Medical History: Past Medical History: Diagnosis Date . Arthritis  . Barrett esophagus  . Bladder  cancer [facility]) dx'd 5329,9242  surg only . CHF (congestive heart failure) ([place])  . Colon polyps   adenomatous . Difficulty sleeping   OCCASIONALLY . Diverticulosis of colon (without mention of hemorrhage)  . Frequency of urination  . GERD (gastroesophageal reflux disease)  . Hiatal hernia  . History of bladder cancer 1981  EXCISION ONLY . History of skin cancer   BASAL CELL . Hyperlipidemia  . Lymphoma, non-Hodgkin's ([place]) 2011  [place]  .  Macular degeneration   LEFT EYE . Multiple nodules of lung 09/16/2011 . PAF (paroxysmal atrial fibrillation) ([place])  . Personal history of colonic polyps 07/15/2010  tubular adenoma . Prostate cancer [facility])   patient denies . PUD (peptic ulcer disease)  . Stomach cancer ([place])   NHL origin . Stroke ([place])  . Weakness  Past Surgical History: Past Surgical History: Procedure Laterality Date . APPENDECTOMY   . bone spur    left shoulder . CHOLECYSTECTOMY   . KNEE ARTHROPLASTY Left 07/30/2015  Procedure: LEFT TOTAL KNEE ARTHROPLASTY WITH COMPUTER NAVIGATION;  Surgeon: Rod Can, MD;  Location: [facility]S;  Service: Orthopedics;  Laterality: Left; . LASER OF PROSTATE W/ GREEN LIGHT PVP   . TENDON REPAIR    right arm . TRIGGER FINGER RELEASE    bilateral hands . WRIST SURGERY    Bilateral HPI: 85 yo male admitted from [place] with AMS, found to have renal failure and possible pna.  Pt has h/o  ???  Parkinsons', dysphagia, pontine cva, renal disease and pna.  He is on a regular/thin diet at his facility.Pt reports he coughs "all the time" when he eats and states its worse with food than drinks.  Subjective: pt awake in chair Assessment / Plan / Recommendation CHL IP CLINICAL IMPRESSIONS 10/19/2018 Clinical Impression Patient presents with significantly worsening dysphagia compared to during his last MBS in 02/2018 resulting in gross residuals and aspiration/penetration.  Secretions also noted that mix with oropharyngeal barium residuals.  Oropharyngeal dysphagia due to his Parkinson's and pontine stroke likely exacerbated by his acute illness.  Oral transiting discoordinated with some lingual pumping and premature spillage/oral residuals.  Pharyngeal swallow initiation was challenging with pt allowing laryngeal penetration for 2 seconds with nectar before pharyngeal swallow even triggered, thus allowing penetration and aspiration.  With mild aspiration of nectar *silent* - cued cough removed aspirates into pharynx which pt  expelled into oral cavity per SLP verbal cue before expectorating.  Penetration and aspiration occurred before, during and after the swallow.  Pt required oral suctioning frequently during MBS due to expectoration of pharyngeal retention.   Motor planning of cued swallows were difficult for pt to perform with initial swallow of puree as pt allowed bolus to be retained in pharynx/vallecular region, thus no pudding was transited into esophagus initially.  Secondary cued dry swallows to clear liquid pharyngeal retention also ineffective due to nearly absent/poor pharyngeal motility.   Gross residuals present even when swallow timing was adequate due to decreased pharyngeal and laryngeal motility.  With aspiration, pt does not initially sense and when sensation is delayed due to larger aspiration amount, bolus is too deep in trachea for him to clear.  Various postures including chin tuck and head turn were not effective to decrease residuals or prevent aspiration/penetration with solids or liquids via cup/straw.  Decreasing amount to TEASPOOON only of thin and nectar using chin tuck posture were the only boluses pt did not aspirate nor present with gross residuals.  Pt will be functionally unable to  receive adequate hydration/nutrition on liquid via tsp diet.  Pt reports he would cough/expectorate even prior to admission -strength of cough/expectoration with ability to mobilize has likely allowed aspiration tolerance.   However given acute on chronic dysphagia, worsening dysphagia, deconditioning, current pna (last one 12/2017) and full code status - SLP highly recommends a palliative referral to establish his goals of care.    Pending GOC, recommend tsps ONLY of nectar, thin with chin tuck posture and multiple swallows.   Will follow up for dysphagia management, education, possibly RMST and will review importance of oral care given pt with copious secretions likely due to his Parkinson's.  Educated pt to  findings/recommendations and concerns.  He reported understanding.  SLP Visit Diagnosis Dysphagia, oropharyngeal phase (R13.12) Attention and concentration deficit following -- Frontal lobe and executive function deficit following -- Impact on safety and function Severe aspiration risk   CHL IP TREATMENT RECOMMENDATION 10/19/2018 Treatment Recommendations Therapy as outlined in treatment plan below   Prognosis 10/19/2018 Prognosis for Safe Diet Advancement Guarded Barriers to Reach Goals Time post onset;Severity of deficits Barriers/Prognosis Comment -- CHL IP DIET RECOMMENDATION 10/19/2018 SLP Diet Recommendations (No Data) Liquid Administration via -- Medication Administration -- Compensations Slow rate;Small sips/bites;Chin tuck Postural Changes --   CHL IP OTHER RECOMMENDATIONS 10/19/2018 Recommended Consults -- Oral Care Recommendations Oral care QID Other Recommendations Order thickener from pharmacy   CHL IP FOLLOW UP RECOMMENDATIONS 10/19/2018 Follow up Recommendations (No Data)   CHL IP FREQUENCY AND DURATION 10/19/2018 Speech Therapy Frequency (ACUTE ONLY) min 2x/week Treatment Duration 2 weeks      CHL IP ORAL PHASE 10/19/2018 Oral Phase Impaired Oral - Pudding Teaspoon -- Oral - Pudding Cup -- Oral - Honey Teaspoon -- Oral - Honey Cup -- Oral - Nectar Teaspoon Weak lingual manipulation;Lingual pumping;Premature spillage Oral - Nectar Cup Reduced posterior propulsion Oral - Nectar Straw Weak lingual manipulation;Reduced posterior propulsion Oral - Thin Teaspoon Weak lingual manipulation;Lingual/palatal residue;Reduced posterior propulsion;Premature spillage Oral - Thin Cup Lingual/palatal residue;Reduced posterior propulsion;Premature spillage Oral - Thin Straw Lingual/palatal residue;Weak lingual manipulation;Reduced posterior propulsion;Premature spillage Oral - Puree Weak lingual manipulation;Reduced posterior propulsion;Lingual/palatal residue Oral - Mech Soft Reduced posterior propulsion;Weak lingual  manipulation;Impaired mastication;Premature spillage;Lingual/palatal residue Oral - Regular -- Oral - Multi-Consistency -- Oral - Pill -- Oral Phase - Comment --  CHL IP PHARYNGEAL PHASE 10/19/2018 Pharyngeal Phase Impaired Pharyngeal- Pudding Teaspoon -- Pharyngeal -- Pharyngeal- Pudding Cup -- Pharyngeal -- Pharyngeal- Honey Teaspoon -- Pharyngeal -- Pharyngeal- Honey Cup -- Pharyngeal -- Pharyngeal- Nectar Teaspoon Delayed swallow initiation-vallecula;Reduced anterior laryngeal mobility;Reduced laryngeal elevation;Reduced airway/laryngeal closure;Reduced tongue base retraction;Penetration/Aspiration during swallow;Penetration/Apiration after swallow;Pharyngeal residue - valleculae Pharyngeal Material enters airway, passes BELOW cords then ejected out;Material enters airway, passes BELOW cords without attempt by patient to eject out (silent aspiration) Pharyngeal- Nectar Cup Delayed swallow initiation-vallecula;Reduced epiglottic inversion;Reduced laryngeal elevation;Reduced airway/laryngeal closure;Reduced tongue base retraction;Pharyngeal residue - valleculae;Compensatory strategies attempted (with notebox) Pharyngeal -- Pharyngeal- Nectar Straw Reduced epiglottic inversion;Reduced laryngeal elevation;Reduced airway/laryngeal closure;Reduced tongue base retraction;Penetration/Aspiration during swallow;Penetration/Apiration after swallow;Moderate aspiration;Pharyngeal residue - valleculae;Compensatory strategies attempted (with notebox) Pharyngeal Material enters airway, passes BELOW cords and not ejected out despite cough attempt by patient;Material enters airway, passes BELOW cords without attempt by patient to eject out (silent aspiration) Pharyngeal- Thin Teaspoon Delayed swallow initiation-vallecula;Reduced epiglottic inversion;Reduced anterior laryngeal mobility;Reduced laryngeal elevation;Reduced airway/laryngeal closure;Reduced tongue base retraction;Pharyngeal residue - valleculae;Compensatory strategies  attempted (with notebox) Pharyngeal -- Pharyngeal- Thin Cup Delayed swallow initiation-vallecula;Reduced epiglottic inversion;Reduced laryngeal elevation;Reduced airway/laryngeal closure;Reduced tongue base retraction;Penetration/Aspiration before swallow;Penetration/Apiration  after swallow;Pharyngeal residue - valleculae Pharyngeal Material enters airway, passes BELOW cords without attempt by patient to eject out (silent aspiration) Pharyngeal- Thin Straw Delayed swallow initiation-vallecula;Reduced epiglottic inversion;Reduced anterior laryngeal mobility;Reduced laryngeal elevation;Reduced airway/laryngeal closure;Reduced tongue base retraction;Penetration/Aspiration before swallow;Penetration/Apiration after swallow;Pharyngeal residue - valleculae Pharyngeal Material enters airway, passes BELOW cords and not ejected out despite cough attempt by patient;Material enters airway, passes BELOW cords without attempt by patient to eject out (silent aspiration) Pharyngeal- Puree Delayed swallow initiation-vallecula;Reduced pharyngeal peristalsis;Reduced epiglottic inversion;Reduced anterior laryngeal mobility;Reduced laryngeal elevation;Reduced airway/laryngeal closure;Reduced tongue base retraction;Pharyngeal residue - valleculae Pharyngeal -- Pharyngeal- Mechanical Soft Delayed swallow initiation-vallecula;Reduced pharyngeal peristalsis;Reduced epiglottic inversion;Reduced anterior laryngeal mobility;Reduced laryngeal elevation;Reduced airway/laryngeal closure;Reduced tongue base retraction;Pharyngeal residue - valleculae Pharyngeal -- Pharyngeal- Regular -- Pharyngeal -- Pharyngeal- Multi-consistency -- Pharyngeal -- Pharyngeal- Pill -- Pharyngeal -- Pharyngeal Comment weak initial swallow with pudding that resulted in 0% transiting into esophagus, 2nd swallow effective to clear approx 50%, chin tuck helpful with tsp amounts to keep pt from aspirating as all of the bolus is retained in the pharynx before swallow  triggered, larger amounts were aspirated, pt required cues to cough/"hock" to expectorate barium throughout the study  CHL IP CERVICAL ESOPHAGEAL PHASE 10/19/2018 Cervical Esophageal Phase Impaired Pudding Teaspoon -- Pudding Cup -- Honey Teaspoon -- Honey Cup -- Nectar Teaspoon -- Nectar Cup -- Nectar Straw -- Thin Teaspoon -- Thin Cup -- Thin Straw -- Puree -- Mechanical Soft -- Regular -- Multi-consistency -- Pill -- Cervical Esophageal Comment appearance of prominent cricopharyngeus did not impair barium flow Luanna Salk, MS [facility] SLP Acute Rehab Services Pager [phone] Office [phone] Macario Golds 10/19/2018, 5:45 PM                  LAB RESULTS: Basic Metabolic Panel: Recent Labs  Lab 10/19/18 0611  10/21/18 0527 10/22/18 0458  NA 138   < > 139 140  K 4.3   < > 3.8 3.2*  CL 109   < > 109 112*  CO2 21*   < > 22 18*  GLUCOSE 94   < > 91 94  BUN 23   < > 27* 26*  CREATININE 1.53*   < > 1.69* 1.58*  CALCIUM 8.0*   < > 8.3* 8.1*  MG 1.8  --   --   --   PHOS 3.1  --   --   --    < > = values in this interval not displayed.   Liver Function Tests: Recent Labs  Lab 10/18/18 1905 10/19/18 0611  AST 19 17  ALT 9 9  ALKPHOS 42 32*  BILITOT 0.5 0.5  PROT 7.8 6.4*  ALBUMIN 2.8* 2.3*   No results for input(s): LIPASE, AMYLASE in the last 168 hours. No results for input(s): AMMONIA in the last 168 hours. CBC: Recent Labs  Lab 10/18/18 1905  10/21/18 0527 10/22/18 0458  WBC 9.9   < > 7.7 5.4  NEUTROABS 8.3*  --   --   --   HGB 13.9   < > 12.7* 11.4*  HCT 47.3   < > 41.6 37.7*  MCV 90.4   < > 88.9 88.3  PLT 204   < > 154 164   < > = values in this interval not displayed.   Cardiac Enzymes: No results for input(s): CKTOTAL, CKMB, CKMBINDEX, TROPONINI in the last 168 hours. BNP: Invalid input(s): POCBNP CBG: No results for input(s): GLUCAP in the last 168 hours.    Disposition and  Follow-up: Discharge Instructions    Diet - low sodium heart healthy    Complete by: As directed    Increase activity slowly   Complete by: As directed        DISPOSITION: [place], Charles Alan, MD Follow up in 2 week(s).   Specialty: Family Medicine Contact information: [address] [place] [address] [phone]            Time coordinating discharge:  35 minutes  Signed:   Estill Cotta M.D. [facility] 10/23/2018, 4:48 PM   Ok to discharge as above. Rx in chart. No changes to d/c plan.   Jonnie Finner, DO 10/24/2018 1056hrs

## 2018-10-23 NOTE — NC FL2 (Signed)
[facility] LEVEL OF CARE SCREENING TOOL     IDENTIFICATION  Patient Name: Jason [name] Birthdate: Oct 22, 1927 Sex: male Admission Date (Current Location): 10/18/2018  [facility] and [place] Number:  Herbalist and Address:  [facility],  [address] [place], [place]      Provider Number: [id number]  Attending Physician Name and Address:  Mendel Corning, MD  Relative Name and Phone Number:  Peggye Form    Current Level of Care: Other (Comment)(ALF) Recommended Level of Care: Assisted Living Facility Prior Approval Number:    Date Approved/Denied:   PASRR Number:    Discharge Plan: (ALF)    Current Diagnoses: Patient Active Problem List   Diagnosis Date Noted  . Dehydration 10/18/2018  . SIRS (systemic inflammatory response syndrome) ([place]) 10/18/2018  . Decubitus ulcer of sacral region, stage 1 10/18/2018  . Memory loss 05/14/2018  . Parkinsonism ([place]) 05/14/2018  . History of stroke 05/04/2018  . Dilated aortic root ([place]) 05/04/2018  . Palliative care by specialist   . Goals of care, counseling/discussion   . HCAP (healthcare-associated pneumonia) 12/22/2017  . Sepsis ([place]) 12/22/2017  . Chronic diastolic (congestive) heart failure ([place]) 12/22/2017  . Aspiration pneumonia ([place]) 12/22/2017  . Diarrhea 12/22/2017  . Fall at home 10/19/2016  . Gait abnormality 09/05/2016  . Parkinson's disease ([place]) 09/05/2016  . CVA (cerebral vascular accident) ([place]) 07/01/2016  . Dysarthria   . History of cancer   . TIA (transient ischemic attack) 06/30/2016  . Hemiplegia affecting dominant side ([place]) 06/30/2016  . History of bladder cancer   . Dysphagia   . Hyperglycemia   . Acute renal failure superimposed on stage 3 chronic kidney disease ([place])   . Acute blood loss anemia   . Leukocytosis   . Weakness   . Community acquired pneumonia 06/16/2016  . Unresponsiveness   . CAP (community acquired pneumonia) 06/15/2016  . Paroxysmal  atrial fibrillation ([place]) 06/15/2016  . Pressure injury of skin 06/15/2016  . Lobar pneumonia ([place])   . Acute congestive heart failure ([place])   . Primary osteoarthritis of left knee 07/30/2015  . BPH (benign prostatic hyperplasia) 07/16/2012  . Multiple nodules of lung 09/16/2011  . GERD (gastroesophageal reflux disease) 06/14/2011  . Constipation, chronic 06/14/2011  . NHL (non-Hodgkin's lymphoma) ([place]) 01/03/2010  . Malignant neoplasm of bladder ([place]) 05/28/2007  . Hyperlipidemia 05/28/2007  . HIATAL HERNIA 05/28/2007  . BARRETT'S ESOPHAGUS, HX OF 05/28/2007    Orientation RESPIRATION BLADDER Height & Weight     Self  Normal Incontinent Weight: 85.2 kg Height:  5\' 11"  (180.3 cm)  BEHAVIORAL SYMPTOMS/MOOD NEUROLOGICAL BOWEL NUTRITION STATUS      Incontinent Diet: Regular  AMBULATORY STATUS COMMUNICATION OF NEEDS Skin   Limited Assist Verbally Other (Comment)(No open wounds red on buttock)                       Personal Care Assistance Level of Assistance  Bathing, Feeding Bathing Assistance: Maximum assistance Feeding assistance: Maximum assistance(needs reminding to tuck chin when swallowing)       Functional Limitations Info  Hearing   Hearing Info: Impaired      SPECIAL CARE FACTORS FREQUENCY                       Contractures Contractures Info: Not present    Additional Factors Info  Code Status Code Status Info: DNR  Current Medications (10/23/2018):  This is the current hospital active medication list Current Facility-Administered Medications  Medication Dose Route Frequency Provider Last Rate Last Dose  . acetaminophen (TYLENOL) tablet 650 mg  650 mg Oral Q6H PRN Toy Baker, MD       Or  . acetaminophen (TYLENOL) suppository 650 mg  650 mg Rectal Q6H PRN Doutova, Anastassia, MD      . acetaminophen (TYLENOL) tablet 650 mg  650 mg Oral Once Toy Baker, MD      . apixaban (ELIQUIS) tablet 5 mg  5 mg Oral BID  Rai, Ripudeep K, MD      . carbidopa-levodopa (SINEMET IR) 25-100 MG per tablet immediate release 1 tablet  1 tablet Oral 3 times per day Toy Baker, MD   1 tablet at 10/23/18 1207  . Chlorhexidine Gluconate Cloth 2 % PADS 6 each  6 each Topical Q0600 Rai, Vernelle Emerald, MD   6 each at 10/23/18 0548  . fluticasone (FLONASE) 50 MCG/ACT nasal spray 2 spray  2 spray Each Nare Daily Rai, Ripudeep K, MD   2 spray at 10/22/18 6803  . furosemide (LASIX) tablet 20 mg  20 mg Oral Daily Rai, Ripudeep K, MD   20 mg at 10/23/18 0949  . HYDROcodone-acetaminophen (NORCO/VICODIN) 5-325 MG per tablet 1-2 tablet  1-2 tablet Oral Q4H PRN Toy Baker, MD   2 tablet at 10/21/18 2350  . Melatonin TABS 3 mg  3 mg Oral QHS Rai, Ripudeep K, MD   3 mg at 10/22/18 2200  . memantine (NAMENDA) tablet 10 mg  10 mg Oral BID Rai, Ripudeep K, MD   10 mg at 10/23/18 0949  . metoprolol tartrate (LOPRESSOR) tablet 25 mg  25 mg Oral BID Rai, Ripudeep K, MD   25 mg at 10/23/18 0951  . mupirocin ointment (BACTROBAN) 2 % 1 application  1 application Nasal BID Rai, Vernelle Emerald, MD   1 application at 21/22/48 0950  . ondansetron (ZOFRAN) tablet 4 mg  4 mg Oral Q6H PRN Doutova, Anastassia, MD       Or  . ondansetron (ZOFRAN) injection 4 mg  4 mg Intravenous Q6H PRN Doutova, Anastassia, MD      . pantoprazole (PROTONIX) EC tablet 40 mg  40 mg Oral Q0600 Rai, Ripudeep K, MD   40 mg at 10/23/18 0548  . Resource ThickenUp Clear   Oral PRN Rai, Ripudeep K, MD      . simvastatin (ZOCOR) tablet 20 mg  20 mg Oral QPM Doutova, Anastassia, MD   20 mg at 10/22/18 2200  . tamsulosin (FLOMAX) capsule 0.4 mg  0.4 mg Oral QHS Doutova, Anastassia, MD   0.4 mg at 10/22/18 2200  . vancomycin (VANCOCIN) 50 mg/mL oral solution 125 mg  125 mg Oral QID Rai, Ripudeep K, MD   125 mg at 10/23/18 1207     Discharge Medications: Please see discharge summary for a list of discharge medications.  Relevant Imaging Results:  Relevant Lab  Results:   Additional Information SS # [id number]  Purcell Mouton, RN

## 2018-10-23 NOTE — Progress Notes (Signed)
ALF will not be able to take pt this late. Pt will need medications scripts to send to ALF Pharmacy.

## 2018-10-23 NOTE — Progress Notes (Signed)
  Waiting for a call from [name] to see if pt may return.

## 2018-10-23 NOTE — NC FL2 (Deleted)
[place] LEVEL OF CARE SCREENING TOOL     IDENTIFICATION  Patient Name: Jason [name] Birthdate: 1927/11/27 Sex: male Admission Date (Current Location): 10/18/2018  [facility] and [place] Number:  Herbalist and Address:  [facility],  [place], [place]      Provider Number: [id number]  Attending Physician Name and Address:  Mendel Corning, MD  Relative Name and Phone Number:  Peggye Form    Current Level of Care: Other (Comment)(ALF) Recommended Level of Care: Assisted Living Facility Prior Approval Number:    Date Approved/Denied:   PASRR Number:    Discharge Plan: (ALF)    Current Diagnoses: Patient Active Problem List   Diagnosis Date Noted  . Dehydration 10/18/2018  . SIRS (systemic inflammatory response syndrome) ([place]) 10/18/2018  . Decubitus ulcer of sacral region, stage 1 10/18/2018  . Memory loss 05/14/2018  . Parkinsonism ([place]) 05/14/2018  . History of stroke 05/04/2018  . Dilated aortic root ([place]) 05/04/2018  . Palliative care by specialist   . Goals of care, counseling/discussion   . HCAP (healthcare-associated pneumonia) 12/22/2017  . Sepsis ([place]) 12/22/2017  . Chronic diastolic (congestive) heart failure ([place]) 12/22/2017  . Aspiration pneumonia ([place]) 12/22/2017  . Diarrhea 12/22/2017  . Fall at home 10/19/2016  . Gait abnormality 09/05/2016  . Parkinson's disease ([place]) 09/05/2016  . CVA (cerebral vascular accident) ([place]) 07/01/2016  . Dysarthria   . History of cancer   . TIA (transient ischemic attack) 06/30/2016  . Hemiplegia affecting dominant side ([place]) 06/30/2016  . History of bladder cancer   . Dysphagia   . Hyperglycemia   . Acute renal failure superimposed on stage 3 chronic kidney disease ([place])   . Acute blood loss anemia   . Leukocytosis   . Weakness   . Community acquired pneumonia 06/16/2016  . Unresponsiveness   . CAP (community acquired pneumonia) 06/15/2016  . Paroxysmal  atrial fibrillation ([place]) 06/15/2016  . Pressure injury of skin 06/15/2016  . Lobar pneumonia ([place])   . Acute congestive heart failure ([place])   . Primary osteoarthritis of left knee 07/30/2015  . BPH (benign prostatic hyperplasia) 07/16/2012  . Multiple nodules of lung 09/16/2011  . GERD (gastroesophageal reflux disease) 06/14/2011  . Constipation, chronic 06/14/2011  . NHL (non-Hodgkin's lymphoma) ([place]) 01/03/2010  . Malignant neoplasm of bladder ([place]) 05/28/2007  . Hyperlipidemia 05/28/2007  . HIATAL HERNIA 05/28/2007  . BARRETT'S ESOPHAGUS, HX OF 05/28/2007    Orientation RESPIRATION BLADDER Height & Weight     Self  Normal Incontinent Weight: 85.2 kg Height:  5\' 11"  (180.3 cm)  BEHAVIORAL SYMPTOMS/MOOD NEUROLOGICAL BOWEL NUTRITION STATUS      Incontinent (dysphagia 1, netar thick)  AMBULATORY STATUS COMMUNICATION OF NEEDS Skin   Limited Assist Verbally Other (Comment)(No open wounds red on buttock)                       Personal Care Assistance Level of Assistance  Bathing, Feeding Bathing Assistance: Maximum assistance Feeding assistance: Maximum assistance(needs reminding to tuck chin when swallowing)       Functional Limitations Info  Hearing   Hearing Info: Impaired      SPECIAL CARE FACTORS FREQUENCY                       Contractures Contractures Info: Not present    Additional Factors Info  Code Status Code Status Info: DNR  Current Medications (10/23/2018):  This is the current hospital active medication list Current Facility-Administered Medications  Medication Dose Route Frequency Provider Last Rate Last Dose  . acetaminophen (TYLENOL) tablet 650 mg  650 mg Oral Q6H PRN Toy Baker, MD       Or  . acetaminophen (TYLENOL) suppository 650 mg  650 mg Rectal Q6H PRN Doutova, Anastassia, MD      . acetaminophen (TYLENOL) tablet 650 mg  650 mg Oral Once Toy Baker, MD      . apixaban (ELIQUIS) tablet 5 mg  5  mg Oral BID Rai, Ripudeep K, MD      . carbidopa-levodopa (SINEMET IR) 25-100 MG per tablet immediate release 1 tablet  1 tablet Oral 3 times per day Toy Baker, MD   1 tablet at 10/23/18 1207  . Chlorhexidine Gluconate Cloth 2 % PADS 6 each  6 each Topical Q0600 Rai, Vernelle Emerald, MD   6 each at 10/23/18 0548  . fluticasone (FLONASE) 50 MCG/ACT nasal spray 2 spray  2 spray Each Nare Daily Rai, Ripudeep K, MD   2 spray at 10/22/18 6144  . furosemide (LASIX) tablet 20 mg  20 mg Oral Daily Rai, Ripudeep K, MD   20 mg at 10/23/18 0949  . HYDROcodone-acetaminophen (NORCO/VICODIN) 5-325 MG per tablet 1-2 tablet  1-2 tablet Oral Q4H PRN Toy Baker, MD   2 tablet at 10/21/18 2350  . Melatonin TABS 3 mg  3 mg Oral QHS Rai, Ripudeep K, MD   3 mg at 10/22/18 2200  . memantine (NAMENDA) tablet 10 mg  10 mg Oral BID Rai, Ripudeep K, MD   10 mg at 10/23/18 0949  . metoprolol tartrate (LOPRESSOR) tablet 25 mg  25 mg Oral BID Rai, Ripudeep K, MD   25 mg at 10/23/18 0951  . mupirocin ointment (BACTROBAN) 2 % 1 application  1 application Nasal BID Rai, Vernelle Emerald, MD   1 application at 31/54/00 0950  . ondansetron (ZOFRAN) tablet 4 mg  4 mg Oral Q6H PRN Doutova, Anastassia, MD       Or  . ondansetron (ZOFRAN) injection 4 mg  4 mg Intravenous Q6H PRN Doutova, Anastassia, MD      . pantoprazole (PROTONIX) EC tablet 40 mg  40 mg Oral Q0600 Rai, Ripudeep K, MD   40 mg at 10/23/18 0548  . Resource ThickenUp Clear   Oral PRN Rai, Ripudeep K, MD      . simvastatin (ZOCOR) tablet 20 mg  20 mg Oral QPM Doutova, Anastassia, MD   20 mg at 10/22/18 2200  . tamsulosin (FLOMAX) capsule 0.4 mg  0.4 mg Oral QHS Doutova, Anastassia, MD   0.4 mg at 10/22/18 2200  . vancomycin (VANCOCIN) 50 mg/mL oral solution 125 mg  125 mg Oral QID Rai, Ripudeep K, MD   125 mg at 10/23/18 1207     Discharge Medications: Please see discharge summary for a list of discharge medications.  Relevant Imaging Results:  Relevant Lab  Results:   Additional Information SS # [id number]  Purcell Mouton, RN

## 2018-10-23 NOTE — NC FL2 (Signed)
[place] LEVEL OF CARE SCREENING TOOL     IDENTIFICATION  Patient Name: Jason [name] Birthdate: 1927-05-18 Sex: male Admission Date (Current Location): 10/18/2018  [facility] and [place] Number:  Herbalist and Address:  [facility],  [place], [place]      Provider Number: [id number]  Attending Physician Name and Address:  Mendel Corning, MD  Relative Name and Phone Number:  Peggye Form    Current Level of Care: Other (Comment)(ALF) Recommended Level of Care: Assisted Living Facility Prior Approval Number:    Date Approved/Denied:   PASRR Number:    Discharge Plan: (ALF)    Current Diagnoses: Patient Active Problem List   Diagnosis Date Noted  . Dehydration 10/18/2018  . SIRS (systemic inflammatory response syndrome) ([place]) 10/18/2018  . Decubitus ulcer of sacral region, stage 1 10/18/2018  . Memory loss 05/14/2018  . Parkinsonism ([place]) 05/14/2018  . History of stroke 05/04/2018  . Dilated aortic root ([place]) 05/04/2018  . Palliative care by specialist   . Goals of care, counseling/discussion   . HCAP (healthcare-associated pneumonia) 12/22/2017  . Sepsis ([place]) 12/22/2017  . Chronic diastolic (congestive) heart failure ([place]) 12/22/2017  . Aspiration pneumonia ([place]) 12/22/2017  . Diarrhea 12/22/2017  . Fall at home 10/19/2016  . Gait abnormality 09/05/2016  . Parkinson's disease ([place]) 09/05/2016  . CVA (cerebral vascular accident) ([place]) 07/01/2016  . Dysarthria   . History of cancer   . TIA (transient ischemic attack) 06/30/2016  . Hemiplegia affecting dominant side ([place]) 06/30/2016  . History of bladder cancer   . Dysphagia   . Hyperglycemia   . Acute renal failure superimposed on stage 3 chronic kidney disease ([place])   . Acute blood loss anemia   . Leukocytosis   . Weakness   . Community acquired pneumonia 06/16/2016  . Unresponsiveness   . CAP (community acquired pneumonia) 06/15/2016  . Paroxysmal  atrial fibrillation ([place]) 06/15/2016  . Pressure injury of skin 06/15/2016  . Lobar pneumonia ([place])   . Acute congestive heart failure ([place])   . Primary osteoarthritis of left knee 07/30/2015  . BPH (benign prostatic hyperplasia) 07/16/2012  . Multiple nodules of lung 09/16/2011  . GERD (gastroesophageal reflux disease) 06/14/2011  . Constipation, chronic 06/14/2011  . NHL (non-Hodgkin's lymphoma) ([place]) 01/03/2010  . Malignant neoplasm of bladder ([place]) 05/28/2007  . Hyperlipidemia 05/28/2007  . HIATAL HERNIA 05/28/2007  . BARRETT'S ESOPHAGUS, HX OF 05/28/2007    Orientation RESPIRATION BLADDER Height & Weight     Self  Normal Incontinent Weight: 85.2 kg Height:  5\' 11"  (180.3 cm)  BEHAVIORAL SYMPTOMS/MOOD NEUROLOGICAL BOWEL NUTRITION STATUS      Incontinent (dysphagia 1, netar thick)  AMBULATORY STATUS COMMUNICATION OF NEEDS Skin   Limited Assist Verbally Other (Comment)(No open wounds red on buttock)                       Personal Care Assistance Level of Assistance  Bathing, Feeding Bathing Assistance: Maximum assistance Feeding assistance: Maximum assistance(needs reminding to tuck chin when swallowing)       Functional Limitations Info  Hearing   Hearing Info: Impaired      SPECIAL CARE FACTORS FREQUENCY                       Contractures Contractures Info: Not present    Additional Factors Info  Code Status Code Status Info: DNR  Current Medications (10/23/2018):  This is the current hospital active medication list Current Facility-Administered Medications  Medication Dose Route Frequency Provider Last Rate Last Dose  . acetaminophen (TYLENOL) tablet 650 mg  650 mg Oral Q6H PRN Toy Baker, MD       Or  . acetaminophen (TYLENOL) suppository 650 mg  650 mg Rectal Q6H PRN Doutova, Anastassia, MD      . acetaminophen (TYLENOL) tablet 650 mg  650 mg Oral Once Toy Baker, MD      . apixaban (ELIQUIS) tablet 5 mg  5  mg Oral BID Rai, Ripudeep K, MD      . carbidopa-levodopa (SINEMET IR) 25-100 MG per tablet immediate release 1 tablet  1 tablet Oral 3 times per day Toy Baker, MD   1 tablet at 10/23/18 1207  . Chlorhexidine Gluconate Cloth 2 % PADS 6 each  6 each Topical Q0600 Rai, Vernelle Emerald, MD   6 each at 10/23/18 0548  . fluticasone (FLONASE) 50 MCG/ACT nasal spray 2 spray  2 spray Each Nare Daily Rai, Ripudeep K, MD   2 spray at 10/22/18 4462  . furosemide (LASIX) tablet 20 mg  20 mg Oral Daily Rai, Ripudeep K, MD   20 mg at 10/23/18 0949  . HYDROcodone-acetaminophen (NORCO/VICODIN) 5-325 MG per tablet 1-2 tablet  1-2 tablet Oral Q4H PRN Toy Baker, MD   2 tablet at 10/21/18 2350  . Melatonin TABS 3 mg  3 mg Oral QHS Rai, Ripudeep K, MD   3 mg at 10/22/18 2200  . memantine (NAMENDA) tablet 10 mg  10 mg Oral BID Rai, Ripudeep K, MD   10 mg at 10/23/18 0949  . metoprolol tartrate (LOPRESSOR) tablet 25 mg  25 mg Oral BID Rai, Ripudeep K, MD   25 mg at 10/23/18 0951  . mupirocin ointment (BACTROBAN) 2 % 1 application  1 application Nasal BID Rai, Vernelle Emerald, MD   1 application at 86/38/17 0950  . ondansetron (ZOFRAN) tablet 4 mg  4 mg Oral Q6H PRN Doutova, Anastassia, MD       Or  . ondansetron (ZOFRAN) injection 4 mg  4 mg Intravenous Q6H PRN Doutova, Anastassia, MD      . pantoprazole (PROTONIX) EC tablet 40 mg  40 mg Oral Q0600 Rai, Ripudeep K, MD   40 mg at 10/23/18 0548  . Resource ThickenUp Clear   Oral PRN Rai, Ripudeep K, MD      . simvastatin (ZOCOR) tablet 20 mg  20 mg Oral QPM Doutova, Anastassia, MD   20 mg at 10/22/18 2200  . tamsulosin (FLOMAX) capsule 0.4 mg  0.4 mg Oral QHS Doutova, Anastassia, MD   0.4 mg at 10/22/18 2200  . vancomycin (VANCOCIN) 50 mg/mL oral solution 125 mg  125 mg Oral QID Rai, Ripudeep K, MD   125 mg at 10/23/18 1207     Discharge Medications: Please see discharge summary for a list of discharge medications.  Relevant Imaging Results:  Relevant Lab  Results:   Additional Information SS # [id number]  Purcell Mouton, RN

## 2018-10-23 NOTE — Progress Notes (Signed)
Daily Progress Note   Patient Name: Jason [name]       Date: 10/23/2018 DOB: 26-Jul-1927  Age: 85 y.o. MRN#: [id number] Attending Physician: Mendel Corning, MD Primary Care Physician: [name], MD Admit Date: 10/18/2018  Reason for Consultation/Follow-up: Establishing goals of care  Subjective: Patient appears weak and chronically ill, resting in bed. Does not appear to be in distress  Call placed and notified on call CSW about impending plan for transfer to [place] with hospice.   Length of Stay: 4  Current Medications: Scheduled Meds:  . acetaminophen  650 mg Oral Once  . amoxicillin-clavulanate  1 tablet Oral Q12H  . carbidopa-levodopa  1 tablet Oral 3 times per day  . Chlorhexidine Gluconate Cloth  6 each Topical Q0600  . enoxaparin (LOVENOX) injection  1 mg/kg Subcutaneous Q12H  . fluticasone  2 spray Each Nare Daily  . furosemide  20 mg Oral Daily  . Melatonin  3 mg Oral QHS  . memantine  10 mg Oral BID  . metoprolol tartrate  25 mg Oral BID  . mupirocin ointment  1 application Nasal BID  . pantoprazole  40 mg Oral Q0600  . simvastatin  20 mg Oral QPM  . tamsulosin  0.4 mg Oral QHS  . vancomycin  125 mg Oral QID    Continuous Infusions:   PRN Meds: acetaminophen **OR** acetaminophen, HYDROcodone-acetaminophen, ondansetron **OR** ondansetron (ZOFRAN) IV, Resource ThickenUp Clear  Physical Exam         General: Asleep at the moment, in no distress. Appears to have regular work of breathing.  Abdomen is not distended Trace edema noted Skin warm and dry    Vital Signs: BP 111/63 (BP Location: Right Arm)   Pulse 64   Temp (!) 97.5 F (36.4 C) (Oral)   Resp 18   Ht '5\' 11"'  (1.803 m)   Wt 85.2 kg   SpO2 98%   BMI 26.20 kg/m  SpO2: SpO2: 98 % O2  Device: O2 Device: Room Air O2 Flow Rate: O2 Flow Rate (L/min): 0 L/min  Intake/output summary:   Intake/Output Summary (Last 24 hours) at 10/23/2018 0831 Last data filed at 10/23/2018 0707 Gross per 24 hour  Intake 423.01 ml  Output 250 ml  Net 173.01 ml   LBM: Last BM Date: 10/21/18 Baseline Weight:  Weight: 85.2 kg Most recent weight: Weight: 85.2 kg       Palliative Assessment/Data:    Flowsheet Rows     Most Recent Value  Intake Tab  Referral Department  Hospitalist  Unit at Time of Referral  Med/Surg Unit  Palliative Care Primary Diagnosis  Sepsis/Infectious Disease  Date Notified  10/19/18  Palliative Care Type  Return patient Palliative Care  Reason for referral  Clarify Goals of Care  Date of Admission  10/18/18  Date first seen by Palliative Care  10/20/18  # of days Palliative referral response time  1 Day(s)  # of days IP prior to Palliative referral  1  Clinical Assessment  Palliative Performance Scale Score  60%  Psychosocial & Spiritual Assessment  Palliative Care Outcomes  Patient/Family meeting held?  Yes  Who was at the meeting?  daughter via phone  Palliative Care Outcomes  Clarified goals of care, Changed CPR status, Completed durable DNR      Patient Active Problem List   Diagnosis Date Noted  . Dehydration 10/18/2018  . SIRS (systemic inflammatory response syndrome) ([place]) 10/18/2018  . Decubitus ulcer of sacral region, stage 1 10/18/2018  . Memory loss 05/14/2018  . Parkinsonism ([place]) 05/14/2018  . History of stroke 05/04/2018  . Dilated aortic root ([place]) 05/04/2018  . Palliative care by specialist   . Goals of care, counseling/discussion   . HCAP (healthcare-associated pneumonia) 12/22/2017  . Sepsis ([place]) 12/22/2017  . Chronic diastolic (congestive) heart failure ([place]) 12/22/2017  . Aspiration pneumonia ([place]) 12/22/2017  . Diarrhea 12/22/2017  . Fall at home 10/19/2016  . Gait abnormality 09/05/2016  . Parkinson's disease ([place])  09/05/2016  . CVA (cerebral vascular accident) ([place]) 07/01/2016  . Dysarthria   . History of cancer   . TIA (transient ischemic attack) 06/30/2016  . Hemiplegia affecting dominant side ([place]) 06/30/2016  . History of bladder cancer   . Dysphagia   . Hyperglycemia   . Acute renal failure superimposed on stage 3 chronic kidney disease ([place])   . Acute blood loss anemia   . Leukocytosis   . Weakness   . Community acquired pneumonia 06/16/2016  . Unresponsiveness   . CAP (community acquired pneumonia) 06/15/2016  . Paroxysmal atrial fibrillation ([place]) 06/15/2016  . Pressure injury of skin 06/15/2016  . Lobar pneumonia ([place])   . Acute congestive heart failure ([place])   . Primary osteoarthritis of left knee 07/30/2015  . BPH (benign prostatic hyperplasia) 07/16/2012  . Multiple nodules of lung 09/16/2011  . GERD (gastroesophageal reflux disease) 06/14/2011  . Constipation, chronic 06/14/2011  . NHL (non-Hodgkin's lymphoma) ([place]) 01/03/2010  . Malignant neoplasm of bladder ([place]) 05/28/2007  . Hyperlipidemia 05/28/2007  . HIATAL HERNIA 05/28/2007  . BARRETT'S ESOPHAGUS, HX OF 05/28/2007    Palliative Care Assessment & Plan   Patient Profile: 85 y.o. male  with past medical history of chronic diastolic heart failure, A. fib on Eliquis, CKD, non-Hodgkin's lymphoma, Parkinson's, aspiration pneumonia, stroke, bladder cancer, GERD, and prostate cancer admitted on 10/18/2018 with [facility] related to community-acquired pneumonia with high concern for aspiration, dysphasia, and acute renal failure.  Palliative consulted for goals of care.   Assessment: Patient Active Problem List   Diagnosis Date Noted  . Dehydration 10/18/2018  . SIRS (systemic inflammatory response syndrome) ([place]) 10/18/2018  . Decubitus ulcer of sacral region, stage 1 10/18/2018  . Memory loss 05/14/2018  . Parkinsonism ([place]) 05/14/2018  . History of stroke 05/04/2018  . Dilated aortic root ([place]) 05/04/2018  .  Palliative  care by specialist   . Goals of care, counseling/discussion   . HCAP (healthcare-associated pneumonia) 12/22/2017  . Sepsis ([place]) 12/22/2017  . Chronic diastolic (congestive) heart failure ([place]) 12/22/2017  . Aspiration pneumonia ([place]) 12/22/2017  . Diarrhea 12/22/2017  . Fall at home 10/19/2016  . Gait abnormality 09/05/2016  . Parkinson's disease ([place]) 09/05/2016  . CVA (cerebral vascular accident) ([place]) 07/01/2016  . Dysarthria   . History of cancer   . TIA (transient ischemic attack) 06/30/2016  . Hemiplegia affecting dominant side ([place]) 06/30/2016  . History of bladder cancer   . Dysphagia   . Hyperglycemia   . Acute renal failure superimposed on stage 3 chronic kidney disease ([place])   . Acute blood loss anemia   . Leukocytosis   . Weakness   . Community acquired pneumonia 06/16/2016  . Unresponsiveness   . CAP (community acquired pneumonia) 06/15/2016  . Paroxysmal atrial fibrillation ([place]) 06/15/2016  . Pressure injury of skin 06/15/2016  . Lobar pneumonia ([place])   . Acute congestive heart failure ([place])   . Primary osteoarthritis of left knee 07/30/2015  . BPH (benign prostatic hyperplasia) 07/16/2012  . Multiple nodules of lung 09/16/2011  . GERD (gastroesophageal reflux disease) 06/14/2011  . Constipation, chronic 06/14/2011  . NHL (non-Hodgkin's lymphoma) ([place]) 01/03/2010  . Malignant neoplasm of bladder ([place]) 05/28/2007  . Hyperlipidemia 05/28/2007  . HIATAL HERNIA 05/28/2007  . BARRETT'S ESOPHAGUS, HX OF 05/28/2007     Recommendations/Plan: - DNR/DNI - Continue current interventions, including continuation of current antibiotics for PNA and c-diff.  Plan is to treat this pneumonia, but with understanding that he will aspirate again at some point in the future.  No plan for PEG at any point in the future. - Overall goal is to get back to [name]. Family is agreeable to hospice and I feel he should qualify with his decline and worsened swallow function. TOC  team to reach out to [name] to see if they can meet his needs and what modifications to care plan need to be met to allow him to return.  10-23-2018: Call placed and discussed with on call CSW about facilitating transfer back to [facility] with hospice.  Call placed, unable to reach daughter [name]. No new PMT specific recommendations High risk for ongoing aspiration or further decline from C diff Agree with hospice support and comfort feeds.  Goals of Care and Additional Recommendations:  Limitations on Scope of Treatment: Avoid Hospitalization  Code Status:    Code Status Orders  (From admission, onward)         Start     Ordered   10/20/18 1325  Do not attempt resuscitation (DNR)  Continuous    Question Answer Comment  In the event of cardiac or respiratory ARREST Do not call a "code blue"   In the event of cardiac or respiratory ARREST Do not perform Intubation, CPR, defibrillation or ACLS   In the event of cardiac or respiratory ARREST Use medication by any route, position, wound care, and other measures to relive pain and suffering. May use oxygen, suction and manual treatment of airway obstruction as needed for comfort.      10/20/18 1324        Code Status History    Date Active Date Inactive Code Status Order ID Comments User Context   10/19/2018 0134 10/20/2018 1324 Full Code [id number]  Toy Baker, MD Inpatient   12/25/2017 0956 12/27/2017 2341 DNR [id number]  Domenic Polite, MD Inpatient   12/22/2017  2041 12/25/2017 0956 Full Code [id number]  Ivor Costa, MD ED   10/19/2016 1558 10/28/2016 1942 Full Code [id number]  Vivia Ewing, PA-C ED   06/30/2016 2102 07/07/2016 1814 Full Code [id number]  Norval Morton, MD ED   06/15/2016 1038 06/20/2016 1819 Full Code [id number]  Johnney Ou ED   Advance Care Planning Activity       Prognosis:  < 6 months most likely.  He remains at high risk for recurrent aspiration as demonstrated by worsened MBS this  admission.    Discharge Planning: Hopefully back to [name] with hospice- Discussed with transition of care team who will reach out to [facility] was discussed with care management/CSW  Thank you for allowing the Palliative Medicine Team to assist in the care of this patient.   Time In: 8 Time Out: 8.25 Total Time 25 Prolonged Time Billed No      Greater than 50%  of this time was spent counseling and coordinating care related to the above assessment and plan.  Loistine Chance, MD [phone] Please contact Palliative Medicine Team phone at [phone] for questions and concerns.

## 2018-10-23 NOTE — Progress Notes (Signed)
Physical Therapy Treatment Patient Details Name: Jason [name] MRN: [id number] DOB: 11-23-1927 Today's Date: 10/23/2018    History of Present Illness 85 y.o. male with medical history significant of chronic diastolic heart failure, bladder cancer, prostate cancer, CVA with residual swallowing deficits, macular degeneration afib, and admitted after fall at facility for PNA, SIRS, and dysphagia    PT Comments    Pt with improved mobility and activity tolerance this session, ambulating short hallway distance and tolerating seated LE exercises to address LE weakness. Pt continues to be limited by fatigue and weakness, PT to continue acutely to address.   Pt with gurgly sound quality to voice after sips x3 from nectar thick cola, pt encouraged coughing and suction as needed, as well as chin tucks and upright sitting when taking sips of drink. RN notified.    Follow Up Recommendations  SNF     Equipment Recommendations  None recommended by PT    Recommendations for Other Services       Precautions / Restrictions Precautions Precautions: Fall Restrictions Weight Bearing Restrictions: No    Mobility  Bed Mobility Overal bed mobility: Needs Assistance Bed Mobility: Supine to Sit     Supine to sit: Mod assist;HOB elevated     General bed mobility comments: mod assist for trunk elevation, LE management, scooting to EOB. Increased time to perform.  Transfers Overall transfer level: Needs assistance Equipment used: Rolling walker (2 wheeled) Transfers: Sit to/from Stand Sit to Stand: Min assist;From elevated surface;+2 safety/equipment         General transfer comment: Min assist for completion of power up, steadying. Verbal cuing for hand placement. Sit to stand x2 trials, pt with posterior leaning on first trial and sat down quickly without eccentric control.  Ambulation/Gait Ambulation/Gait assistance: Min assist;+2 safety/equipment(chair follow) Gait Distance (Feet):  30 Feet Assistive device: Rolling walker (2 wheeled) Gait Pattern/deviations: Step-through pattern;Shuffle;Trunk flexed;Decreased stride length Gait velocity: decr   General Gait Details: Min assist for steadying, verbal cuing to take his time and placement in RW. Pt limited in ambulation distance by fatigue, requiring seated rest.   Stairs             Wheelchair Mobility    Modified Rankin (Stroke Patients Only)       Balance Overall balance assessment: History of Falls;Needs assistance Sitting-balance support: No upper extremity supported;Feet supported Sitting balance-Leahy Scale: Fair     Standing balance support: Bilateral upper extremity supported Standing balance-Leahy Scale: Poor Standing balance comment: reliant on UE support and PT for steadying                            Cognition Arousal/Alertness: Awake/alert Behavior During Therapy: WFL for tasks assessed/performed Overall Cognitive Status: Within Functional Limits for tasks assessed                                 General Comments: pleasant, follows commands, HOH, ? memory vs didn't hear      Exercises General Exercises - Lower Extremity Quad Sets: AROM;Both;10 reps;Seated Long Arc Quad: AROM;Both;AAROM;10 reps;Seated Hip ABduction/ADduction: AAROM;Both;10 reps;Seated    General Comments General comments (skin integrity, edema, etc.): Pt with gurgly sound quality to voice after sips x3 from nectar thick cola, pt encouraged coughing and suction as needed, as well as chin tucks and upright sitting when taking sips of drink. RN notified.  Pertinent Vitals/Pain Pain Assessment: No/denies pain    Home Living                      Prior Function            PT Goals (current goals can now be found in the care plan section) Acute Rehab PT Goals Patient Stated Goal: none stated PT Goal Formulation: With patient Time For Goal Achievement: 11/02/18 Potential  to Achieve Goals: Good Progress towards PT goals: Progressing toward goals    Frequency    Min 2X/week      PT Plan Current plan remains appropriate    Co-evaluation              AM-PAC PT "6 Clicks" Mobility   Outcome Measure  Help needed turning from your back to your side while in a flat bed without using bedrails?: A Lot Help needed moving from lying on your back to sitting on the side of a flat bed without using bedrails?: A Lot Help needed moving to and from a bed to a chair (including a wheelchair)?: A Little Help needed standing up from a chair using your arms (e.g., wheelchair or bedside chair)?: A Little Help needed to walk in hospital room?: A Little Help needed climbing 3-5 steps with a railing? : A Lot 6 Click Score: 15    End of Session Equipment Utilized During Treatment: Gait belt Activity Tolerance: Patient tolerated treatment well Patient left: in chair;with chair alarm set;with call bell/phone within reach;with nursing/sitter in room Nurse Communication: Mobility status PT Visit Diagnosis: Other abnormalities of gait and mobility (R26.89);Muscle weakness (generalized) (M62.81)     Time: 9244-6286 PT Time Calculation (min) (ACUTE ONLY): 27 min  Charges:  $Gait Training: 8-22 mins $Therapeutic Exercise: 8-22 mins                     Julien Girt, PT Acute Rehabilitation Services Pager [phone]  Office [phone]    Roxine Caddy D Elonda Husky 10/23/2018, 12:43 PM

## 2018-10-24 MED ORDER — VANCOMYCIN 50 MG/ML ORAL SOLUTION: 125.0000 mg | Freq: Four times a day (QID) | ORAL | 0 refills | Status: DC

## 2018-10-24 MED ORDER — POTASSIUM CHLORIDE CRYS ER 20 MEQ PO TBCR: 40.0000 meq | EXTENDED_RELEASE_TABLET | Freq: Every day | ORAL | 1 refills | Status: DC

## 2018-10-24 NOTE — Progress Notes (Signed)
Hydrologist [facility])  Hospital Liaison: RN note  Notified by Purcell Mouton, CMRN of patient/family request for [facility] services at home after discharge. Chart and patient information under review by [facility] physician. Hospice eligibility pending at this time.     Writer spoke with daughter, Jason [name]  to initiate education related to hospice philosophy, services and team approach to care. Jason [name] verbalized understanding of information given. Per discussion, plan is for discharge to [name].     Please send signed and completed DNR form home with patient/family. Patient will need prescriptions for discharge comfort medications.     No DME requested at this time.     [facility] aware of the above. Please notify [facility] when patient is ready to leave the unit at discharge. (Call [phone] or [phone] after 5pm.) [facility] information and contact numbers given to [facility].    Please call with any hospice related questions.     Thank you for this referral.     Farrel Gordon, RN, CCM  [place] (listed on [place] under Hospice and [place])  [phone]  ?

## 2018-10-24 NOTE — Plan of Care (Signed)
  Problem: Clinical Measurements: Goal: Ability to maintain clinical measurements within normal limits will improve Outcome: Completed/Met Goal: Will remain free from infection Outcome: Completed/Met Goal: Diagnostic test results will improve Outcome: Completed/Met Goal: Respiratory complications will improve Outcome: Completed/Met   Problem: Activity: Goal: Risk for activity intolerance will decrease Outcome: Completed/Met

## 2018-10-24 NOTE — Progress Notes (Signed)
  Speech Language Pathology Treatment: Dysphagia  Patient Details Name: Jason [name] MRN: [id number] DOB: 05/15/1927 Today's Date: 10/24/2018  Date of service 10/23/2018 Time: 1093-2355 SLP Time Calculation (min) (ACUTE ONLY): 25 min  Assessment / Plan / Recommendation Clinical Impression  Today pt having difficulty following instructions for swallow precautions but is quite pleasant and consuming nectar thick soda during session.  Pt continues with coughing and expectoration of boluses after swallowing - gross retention noted on MBS.  At this time SLP advises against chin tuck posture as posture beneficial on MBS to retain tsp amount of liquid barium prior to swallow trigger.  Larger amounts will spill over epiglottis more readily into airway.   SLP reviewed MBS with pt and discussed his diet wishes.  He reports desire to have solids and given he is to be followed by hospice would recommend to consider advancing solids for his comfort/QOL.   Also given gross residuals present on MBS, excessively viscous puree will likely adhere into pharynx more than solids he can pulverize with mastication and increase pulmonary complications with aspiration.  Thin liquids do result in immediate coughing post=swallow at times with pt having difficult catching his breath - as due to viscocity they immediately spill into his airway - thus for maximal comfort- nectar recommended.  Pt continues to cough/"hock" and expectorate into oral cavity after which he uses oral suction to clear.     Observed pt with intake of nectar liquids and graham cracker.  He continues to cough and expectorate throughout intake after swallowing.  He reports today eating all of his lunch and denies having "coughing". Advised him to continue to cough/expectorate as needed.   Pt has been tolerating diet of puree/nectar thus far and he is aspirating across all consistencies.  Pt's ability to expectorate boluses into oral cavity from pharynx and  strong cough significantly aid in airway protection, thus recommend continue SLP (RMST) to maximize his comfort and pulmonary clearance.   Educated pt to findings/recommendations.     HPI HPI: 85 yo male admitted from [name] with AMS, found to have renal failure and possible pna.  Pt has h/o Parkinsons', dysphagia, pontine cva, renal disease and pna.  He is on a regular/thin diet at his facility.Pt reports he coughs "all the time" when he eats and states its worse with food than drinks.      SLP Plan  Continue with current plan of care       Recommendations  Diet recommendations: Dysphagia 3 (mechanical soft);Nectar-thick liquid Liquids provided via: Cup Medication Administration: Whole meds with puree Compensations: Slow rate;Small sips/bites;Other (Comment)(cough/hock and expectorate throughout intake) Postural Changes and/or Swallow Maneuvers: Seated upright 90 degrees;Upright 30-60 min after meal                Oral Care Recommendations: Oral care QID SLP Visit Diagnosis: Dysphagia, oropharyngeal phase (R13.12) Plan: Continue with current plan of care       [place], [facility] SLP [place] Pager [phone] Office [phone]   Macario Golds 10/24/2018, 7:15 AM

## 2018-10-24 NOTE — Consult Note (Signed)
   [facility] Inpatient Consult   10/24/2018  Jason [name] [birth year] [id number]  Patient chart has been reviewed for  high risk score, 30%, for unplanned readmissions.  Patient assessed for community [facility] Management follow up needs.  Chart review reveals patient disposition is for assisted living facility. No [facility] Care Management needs.   Netta Cedars, MSN, [facility] Liaison Nurse Mobile Phone [phone]  Toll free office [phone]

## 2018-10-24 NOTE — Progress Notes (Signed)
Report called to [facility], LPN at [facility]. [phone]. Pt prepared to for transport. SRP, RN

## 2018-10-24 NOTE — Progress Notes (Addendum)
Patient see and examined this AM. Ok to discharge as per yesterday's d/c summary -- addendum made to reflect medication changes, but otherwise ok.   General: 85 y.o. male resting in bed in NAD Cardiovascular: RRR, +S1, S2, no m/g/r, equal pulses throughout Respiratory: CTABL, no w/r/r, normal WOB GI: BS+, NDNT, no masses noted, no organomegaly noted MSK: No e/c/c Neuro: somnolent  Discharge Diagnoses:     Acute renal failure superimposed on stage III chronic kidney disease . SIRS (systemic inflammatory response syndrome) (HCC) likely due to aspiration pneumonia Dysphagia with chronic aspiration . C. difficile diarrhea . Malignant neoplasm of bladder ([place]) . Hyperlipidemia . NHL (non-Hodgkin's lymphoma) ([place]) . GERD (gastroesophageal reflux disease) . BPH (benign prostatic hyperplasia) . Paroxysmal atrial fibrillation (HCC) . Parkinson's disease ([place]) . Acute on chronic diastolic (congestive) heart failure ([place]) . Dehydration . Decubitus ulcer of sacral region, stage 1  Less than 30 minutes discharge time spent today.  Jonnie Finner, DO

## 2018-10-26 ENCOUNTER — Other Ambulatory Visit: Payer: Self-pay

## 2018-10-26 ENCOUNTER — Inpatient Hospital Stay (HOSPITAL_COMMUNITY)
Admission: EM | Admit: 2018-10-26 | Discharge: 2018-10-30 | DRG: 871 | Disposition: A | Discharge status: Hospice | Payer: Medicare Other | Source: Skilled Nursing Facility | Attending: Nephrology | Admitting: Nephrology

## 2018-10-26 ENCOUNTER — Encounter (HOSPITAL_COMMUNITY): Payer: Self-pay

## 2018-10-26 ENCOUNTER — Emergency Department (HOSPITAL_COMMUNITY): Payer: Medicare Other

## 2018-10-26 DIAGNOSIS — A0472 Enterocolitis due to Clostridium difficile, not specified as recurrent: Secondary | ICD-10-CM | POA: Diagnosis present

## 2018-10-26 DIAGNOSIS — Z209 Contact with and (suspected) exposure to unspecified communicable disease: Secondary | ICD-10-CM | POA: Diagnosis not present

## 2018-10-26 DIAGNOSIS — Z79899 Other long term (current) drug therapy: Secondary | ICD-10-CM

## 2018-10-26 DIAGNOSIS — R1310 Dysphagia, unspecified: Secondary | ICD-10-CM | POA: Diagnosis not present

## 2018-10-26 DIAGNOSIS — I4891 Unspecified atrial fibrillation: Secondary | ICD-10-CM | POA: Diagnosis not present

## 2018-10-26 DIAGNOSIS — Y95 Nosocomial condition: Secondary | ICD-10-CM | POA: Diagnosis present

## 2018-10-26 DIAGNOSIS — I130 Hypertensive heart and chronic kidney disease with heart failure and stage 1 through stage 4 chronic kidney disease, or unspecified chronic kidney disease: Secondary | ICD-10-CM | POA: Diagnosis not present

## 2018-10-26 DIAGNOSIS — Z8249 Family history of ischemic heart disease and other diseases of the circulatory system: Secondary | ICD-10-CM

## 2018-10-26 DIAGNOSIS — N183 Chronic kidney disease, stage 3 unspecified: Secondary | ICD-10-CM | POA: Diagnosis present

## 2018-10-26 DIAGNOSIS — K219 Gastro-esophageal reflux disease without esophagitis: Secondary | ICD-10-CM | POA: Diagnosis present

## 2018-10-26 DIAGNOSIS — I480 Paroxysmal atrial fibrillation: Secondary | ICD-10-CM | POA: Diagnosis not present

## 2018-10-26 DIAGNOSIS — N400 Enlarged prostate without lower urinary tract symptoms: Secondary | ICD-10-CM | POA: Diagnosis present

## 2018-10-26 DIAGNOSIS — Z79890 Hormone replacement therapy (postmenopausal): Secondary | ICD-10-CM

## 2018-10-26 DIAGNOSIS — Z8551 Personal history of malignant neoplasm of bladder: Secondary | ICD-10-CM | POA: Diagnosis not present

## 2018-10-26 DIAGNOSIS — I5032 Chronic diastolic (congestive) heart failure: Secondary | ICD-10-CM | POA: Diagnosis present

## 2018-10-26 DIAGNOSIS — Z85828 Personal history of other malignant neoplasm of skin: Secondary | ICD-10-CM

## 2018-10-26 DIAGNOSIS — Z7401 Bed confinement status: Secondary | ICD-10-CM | POA: Diagnosis not present

## 2018-10-26 DIAGNOSIS — Z8572 Personal history of non-Hodgkin lymphomas: Secondary | ICD-10-CM | POA: Diagnosis not present

## 2018-10-26 DIAGNOSIS — G9341 Metabolic encephalopathy: Secondary | ICD-10-CM | POA: Diagnosis present

## 2018-10-26 DIAGNOSIS — L89151 Pressure ulcer of sacral region, stage 1: Secondary | ICD-10-CM | POA: Diagnosis present

## 2018-10-26 DIAGNOSIS — J189 Pneumonia, unspecified organism: Secondary | ICD-10-CM | POA: Diagnosis not present

## 2018-10-26 DIAGNOSIS — I69391 Dysphagia following cerebral infarction: Secondary | ICD-10-CM

## 2018-10-26 DIAGNOSIS — Z96652 Presence of left artificial knee joint: Secondary | ICD-10-CM | POA: Diagnosis present

## 2018-10-26 DIAGNOSIS — R404 Transient alteration of awareness: Secondary | ICD-10-CM | POA: Diagnosis not present

## 2018-10-26 DIAGNOSIS — I5030 Unspecified diastolic (congestive) heart failure: Secondary | ICD-10-CM | POA: Diagnosis not present

## 2018-10-26 DIAGNOSIS — M2550 Pain in unspecified joint: Secondary | ICD-10-CM | POA: Diagnosis not present

## 2018-10-26 DIAGNOSIS — G20 Parkinson's disease: Secondary | ICD-10-CM | POA: Diagnosis not present

## 2018-10-26 DIAGNOSIS — Z7901 Long term (current) use of anticoagulants: Secondary | ICD-10-CM

## 2018-10-26 DIAGNOSIS — R627 Adult failure to thrive: Secondary | ICD-10-CM | POA: Diagnosis present

## 2018-10-26 DIAGNOSIS — K2270 Barrett's esophagus without dysplasia: Secondary | ICD-10-CM | POA: Diagnosis present

## 2018-10-26 DIAGNOSIS — A419 Sepsis, unspecified organism: Secondary | ICD-10-CM | POA: Diagnosis not present

## 2018-10-26 DIAGNOSIS — Z85028 Personal history of other malignant neoplasm of stomach: Secondary | ICD-10-CM

## 2018-10-26 DIAGNOSIS — Z741 Need for assistance with personal care: Secondary | ICD-10-CM | POA: Diagnosis not present

## 2018-10-26 DIAGNOSIS — N179 Acute kidney failure, unspecified: Secondary | ICD-10-CM

## 2018-10-26 DIAGNOSIS — Z8711 Personal history of peptic ulcer disease: Secondary | ICD-10-CM

## 2018-10-26 DIAGNOSIS — M1990 Unspecified osteoarthritis, unspecified site: Secondary | ICD-10-CM | POA: Diagnosis present

## 2018-10-26 DIAGNOSIS — Z66 Do not resuscitate: Secondary | ICD-10-CM | POA: Diagnosis present

## 2018-10-26 DIAGNOSIS — E785 Hyperlipidemia, unspecified: Secondary | ICD-10-CM | POA: Diagnosis present

## 2018-10-26 DIAGNOSIS — R410 Disorientation, unspecified: Secondary | ICD-10-CM | POA: Diagnosis not present

## 2018-10-26 DIAGNOSIS — K5730 Diverticulosis of large intestine without perforation or abscess without bleeding: Secondary | ICD-10-CM | POA: Diagnosis present

## 2018-10-26 DIAGNOSIS — J90 Pleural effusion, not elsewhere classified: Secondary | ICD-10-CM | POA: Diagnosis not present

## 2018-10-26 DIAGNOSIS — J690 Pneumonitis due to inhalation of food and vomit: Secondary | ICD-10-CM | POA: Diagnosis not present

## 2018-10-26 DIAGNOSIS — Z9049 Acquired absence of other specified parts of digestive tract: Secondary | ICD-10-CM

## 2018-10-26 DIAGNOSIS — F0280 Dementia in other diseases classified elsewhere without behavioral disturbance: Secondary | ICD-10-CM | POA: Diagnosis present

## 2018-10-26 DIAGNOSIS — Z20828 Contact with and (suspected) exposure to other viral communicable diseases: Secondary | ICD-10-CM | POA: Diagnosis present

## 2018-10-26 DIAGNOSIS — Z87891 Personal history of nicotine dependence: Secondary | ICD-10-CM

## 2018-10-26 DIAGNOSIS — R5381 Other malaise: Secondary | ICD-10-CM | POA: Diagnosis not present

## 2018-10-26 DIAGNOSIS — Z6826 Body mass index (BMI) 26.0-26.9, adult: Secondary | ICD-10-CM | POA: Diagnosis not present

## 2018-10-26 DIAGNOSIS — I69318 Other symptoms and signs involving cognitive functions following cerebral infarction: Secondary | ICD-10-CM | POA: Diagnosis not present

## 2018-10-26 DIAGNOSIS — Z8701 Personal history of pneumonia (recurrent): Secondary | ICD-10-CM

## 2018-10-26 DIAGNOSIS — R6510 Systemic inflammatory response syndrome (SIRS) of non-infectious origin without acute organ dysfunction: Secondary | ICD-10-CM | POA: Diagnosis not present

## 2018-10-26 DIAGNOSIS — R6520 Severe sepsis without septic shock: Secondary | ICD-10-CM | POA: Diagnosis not present

## 2018-10-26 LAB — COMPREHENSIVE METABOLIC PANEL
ALT: 7 U/L (ref 0–44)
AST: 27 U/L (ref 15–41)
Albumin: 2.3 g/dL — ABNORMAL LOW (ref 3.5–5.0)
Alkaline Phosphatase: 29 U/L — ABNORMAL LOW (ref 38–126)
Anion gap: 9 (ref 5–15)
BUN: 29 mg/dL — ABNORMAL HIGH (ref 8–23)
CO2: 22 mmol/L (ref 22–32)
Calcium: 8.3 mg/dL — ABNORMAL LOW (ref 8.9–10.3)
Chloride: 108 mmol/L (ref 98–111)
Creatinine, Ser: 2.51 mg/dL — ABNORMAL HIGH (ref 0.61–1.24)
GFR calc Af Amer: 25 mL/min — ABNORMAL LOW (ref >60)
GFR calc non Af Amer: 22 mL/min — ABNORMAL LOW (ref >60)
Glucose, Bld: 102 mg/dL — ABNORMAL HIGH (ref 70–99)
Potassium: 5.1 mmol/L (ref 3.5–5.1)
Sodium: 139 mmol/L (ref 135–145)
Total Bilirubin: 0.8 mg/dL (ref 0.3–1.2)
Total Protein: 6.7 g/dL (ref 6.5–8.1)

## 2018-10-26 LAB — CBC WITH DIFFERENTIAL/PLATELET
Abs Immature Granulocytes: 0.13 10*3/uL — ABNORMAL HIGH (ref 0.00–0.07)
Basophils Absolute: 0.1 10*3/uL (ref 0.0–0.1)
Basophils Relative: 0 %
Eosinophils Absolute: 0 10*3/uL (ref 0.0–0.5)
Eosinophils Relative: 0 %
HCT: 43.7 % (ref 39.0–52.0)
Hemoglobin: 13.6 g/dL (ref 13.0–17.0)
Immature Granulocytes: 1 %
Lymphocytes Relative: 6 %
Lymphs Abs: 0.7 10*3/uL (ref 0.7–4.0)
MCH: 27.3 pg (ref 26.0–34.0)
MCHC: 31.1 g/dL (ref 30.0–36.0)
MCV: 87.6 fL (ref 80.0–100.0)
Monocytes Absolute: 1 10*3/uL (ref 0.1–1.0)
Monocytes Relative: 7 %
Neutro Abs: 11.4 10*3/uL — ABNORMAL HIGH (ref 1.7–7.7)
Neutrophils Relative %: 86 %
Platelets: 203 10*3/uL (ref 150–400)
RBC: 4.99 MIL/uL (ref 4.22–5.81)
RDW: 15 % (ref 11.5–15.5)
WBC: 13.3 10*3/uL — ABNORMAL HIGH (ref 4.0–10.5)
nRBC: 0 % (ref 0.0–0.2)

## 2018-10-26 LAB — URINALYSIS, ROUTINE W REFLEX MICROSCOPIC
Bilirubin Urine: NEGATIVE
Glucose, UA: NEGATIVE mg/dL
Hgb urine dipstick: NEGATIVE
Ketones, ur: 5 mg/dL — AB
Leukocytes,Ua: NEGATIVE
Nitrite: NEGATIVE
Protein, ur: NEGATIVE mg/dL
Specific Gravity, Urine: 1.021 (ref 1.005–1.030)
pH: 5 (ref 5.0–8.0)

## 2018-10-26 LAB — PROTIME-INR
INR: 3.2 — ABNORMAL HIGH (ref 0.8–1.2)
Prothrombin Time: 32.5 seconds — ABNORMAL HIGH (ref 11.4–15.2)

## 2018-10-26 LAB — LACTIC ACID, PLASMA: Lactic Acid, Venous: 1.7 mmol/L (ref 0.5–1.9)

## 2018-10-26 LAB — SARS CORONAVIRUS 2 BY RT PCR (HOSPITAL ORDER, PERFORMED IN ~~LOC~~ HOSPITAL LAB): SARS Coronavirus 2: NEGATIVE

## 2018-10-26 LAB — APTT: aPTT: 43 seconds — ABNORMAL HIGH (ref 24–36)

## 2018-10-26 MED ORDER — SODIUM CHLORIDE 0.9 % IV SOLN
2.0000 g | Freq: Once | INTRAVENOUS | Status: AC
  Administered 2018-10-26: 2 g via INTRAVENOUS
  Filled 2018-10-26: qty 2

## 2018-10-26 MED ORDER — SODIUM CHLORIDE 0.9 % IV SOLN
1.5000 g | Freq: Two times a day (BID) | INTRAVENOUS | Status: DC
  Administered 2018-10-27 (×2): 1.5 g via INTRAVENOUS
  Filled 2018-10-26 (×4): qty 4

## 2018-10-26 MED ORDER — ACETAMINOPHEN 650 MG RE SUPP: 650.0000 mg | Freq: Four times a day (QID) | RECTAL | Status: DC | PRN

## 2018-10-26 MED ORDER — APIXABAN 2.5 MG PO TABS
2.5000 mg | ORAL_TABLET | Freq: Two times a day (BID) | ORAL | Status: DC
  Administered 2018-10-27 – 2018-10-30 (×7): 2.5 mg via ORAL
  Filled 2018-10-26 (×7): qty 1

## 2018-10-26 MED ORDER — SODIUM CHLORIDE 0.9% FLUSH
3.0000 mL | Freq: Two times a day (BID) | INTRAVENOUS | Status: DC
  Administered 2018-10-26 – 2018-10-29 (×7): 3 mL via INTRAVENOUS

## 2018-10-26 MED ORDER — VANCOMYCIN HCL IN DEXTROSE 1-5 GM/200ML-% IV SOLN
1000.0000 mg | Freq: Once | INTRAVENOUS | Status: AC
  Administered 2018-10-26: 1000 mg via INTRAVENOUS
  Filled 2018-10-26: qty 200

## 2018-10-26 MED ORDER — MEMANTINE HCL 10 MG PO TABS
10.0000 mg | ORAL_TABLET | Freq: Two times a day (BID) | ORAL | Status: DC
  Administered 2018-10-27 – 2018-10-30 (×7): 10 mg via ORAL
  Filled 2018-10-26 (×2): qty 1
  Filled 2018-10-26: qty 2
  Filled 2018-10-26 (×2): qty 1
  Filled 2018-10-26: qty 2
  Filled 2018-10-26 (×2): qty 1
  Filled 2018-10-26: qty 2
  Filled 2018-10-26 (×3): qty 1

## 2018-10-26 MED ORDER — ORAL CARE MOUTH RINSE
15.0000 mL | Freq: Two times a day (BID) | OROMUCOSAL | Status: DC
  Administered 2018-10-26 – 2018-10-30 (×8): 15 mL via OROMUCOSAL

## 2018-10-26 MED ORDER — CHLORHEXIDINE GLUCONATE CLOTH 2 % EX PADS
6.0000 | MEDICATED_PAD | Freq: Every day | CUTANEOUS | Status: DC
  Administered 2018-10-26 – 2018-10-29 (×3): 6 via TOPICAL

## 2018-10-26 MED ORDER — ACETAMINOPHEN 325 MG PO TABS: 650.0000 mg | ORAL_TABLET | Freq: Four times a day (QID) | ORAL | Status: DC | PRN

## 2018-10-26 MED ORDER — CARBIDOPA-LEVODOPA 25-100 MG PO TABS
1.0000 | ORAL_TABLET | Freq: Three times a day (TID) | ORAL | Status: DC
  Administered 2018-10-27 – 2018-10-30 (×9): 1 via ORAL
  Filled 2018-10-26 (×12): qty 1

## 2018-10-26 MED ORDER — ACETAMINOPHEN 650 MG RE SUPP
650.0000 mg | Freq: Once | RECTAL | Status: AC
  Administered 2018-10-26: 650 mg via RECTAL
  Filled 2018-10-26: qty 1

## 2018-10-26 MED ORDER — ONDANSETRON HCL 4 MG PO TABS
4.0000 mg | ORAL_TABLET | Freq: Four times a day (QID) | ORAL | Status: DC | PRN
  Administered 2018-10-28: 4 mg via ORAL
  Filled 2018-10-26: qty 1

## 2018-10-26 MED ORDER — SIMVASTATIN 20 MG PO TABS
20.0000 mg | ORAL_TABLET | Freq: Every evening | ORAL | Status: DC
  Administered 2018-10-27 – 2018-10-29 (×3): 20 mg via ORAL
  Filled 2018-10-26 (×5): qty 1

## 2018-10-26 MED ORDER — ONDANSETRON HCL 4 MG/2ML IJ SOLN: 4.0000 mg | Freq: Four times a day (QID) | INTRAMUSCULAR | Status: DC | PRN

## 2018-10-26 MED ORDER — VANCOMYCIN 50 MG/ML ORAL SOLUTION
125.0000 mg | Freq: Four times a day (QID) | ORAL | Status: DC
  Filled 2018-10-26: qty 2.5

## 2018-10-26 MED ORDER — VANCOMYCIN HCL 500 MG IV SOLR
500.0000 mg | Freq: Four times a day (QID) | Status: DC
  Administered 2018-10-27 – 2018-10-29 (×11): 500 mg via RECTAL
  Filled 2018-10-26 (×12): qty 500

## 2018-10-26 MED ORDER — TAMSULOSIN HCL 0.4 MG PO CAPS
0.4000 mg | ORAL_CAPSULE | Freq: Every day | ORAL | Status: DC
  Administered 2018-10-27 – 2018-10-29 (×3): 0.4 mg via ORAL
  Filled 2018-10-26 (×3): qty 1

## 2018-10-26 MED ORDER — SODIUM CHLORIDE 0.9 % IV BOLUS
1000.0000 mL | Freq: Once | INTRAVENOUS | Status: AC
  Administered 2018-10-26: 17:00:00 1000 mL via INTRAVENOUS

## 2018-10-26 NOTE — ED Triage Notes (Signed)
EMS reports from [facility], called out for generalized weakness and lethargy x 2 days, failure to thrive.  BP 98/60 HR 100 RR 16 Sp02 97 2ltrs CBG 149  37ml NS enroute 22ga R forearm.

## 2018-10-26 NOTE — ED Notes (Signed)
One set of cultures gathered, unable to collect second set. Provider informed.

## 2018-10-26 NOTE — ED Provider Notes (Signed)
[facility] DEPT Provider Note   CSN: [id number] Arrival date & time: 10/26/18  1424    History   Chief Complaint Chief Complaint  Patient presents with  . Weakness    HPI Jason [name] is a 85 y.o. male hx of CHF, GERD, non-Hodgkin's lymphoma, recurrent pneumonia from aspiration here presenting with decrease mental status, weakness, failure to thrive.  Patient is from [place] with nursing home.  Unable to give any history.  Just had a recent admission for recurrent aspiration pneumonia and was discharged back to the nursing home 2 days ago on Augmentin.  Patient is noted to not eat and drink much and has generalized weakness.  He was noted to be borderline hypotensive with a blood pressure 98/60 and tachycardic per EMS.  Patient unable to give me much history and was noted by the nurse to have a stage I sacral decub ulcer.      The history is provided by the patient. The history is limited by the condition of the patient.   Level V caveat- dementia   Past Medical History:  Diagnosis Date  . Arthritis   . Barrett esophagus   . Bladder cancer ([place]) dx'd 4431,5400   surg only  . CHF (congestive heart failure) ([place])   . Colon polyps    adenomatous  . Difficulty sleeping    OCCASIONALLY  . Diverticulosis of colon (without mention of hemorrhage)   . Frequency of urination   . GERD (gastroesophageal reflux disease)   . Hiatal hernia   . History of bladder cancer 1981   EXCISION ONLY  . History of skin cancer    BASAL CELL  . Hyperlipidemia   . Lymphoma, non-Hodgkin's ([place]) 2011   [place]   . Macular degeneration    LEFT EYE  . Multiple nodules of lung 09/16/2011  . PAF (paroxysmal atrial fibrillation) ([place])   . Personal history of colonic polyps 07/15/2010   tubular adenoma  . Prostate cancer [facility])    patient denies  . PUD (peptic ulcer disease)   . Stomach cancer ([place])    NHL origin  . Stroke ([place])   . Weakness     Patient  Active Problem List   Diagnosis Date Noted  . C. difficile diarrhea 10/26/2018  . Dehydration 10/18/2018  . SIRS (systemic inflammatory response syndrome) ([place]) 10/18/2018  . Decubitus ulcer of sacral region, stage 1 10/18/2018  . Memory loss 05/14/2018  . Parkinsonism ([place]) 05/14/2018  . History of stroke 05/04/2018  . Dilated aortic root ([place]) 05/04/2018  . Palliative care by specialist   . Goals of care, counseling/discussion   . HCAP (healthcare-associated pneumonia) 12/22/2017  . Sepsis ([place]) 12/22/2017  . Chronic diastolic (congestive) heart failure ([place]) 12/22/2017  . Aspiration pneumonia ([place]) 12/22/2017  . Diarrhea 12/22/2017  . Fall at home 10/19/2016  . Gait abnormality 09/05/2016  . Parkinson's disease ([place]) 09/05/2016  . CVA (cerebral vascular accident) ([place]) 07/01/2016  . Dysarthria   . History of cancer   . TIA (transient ischemic attack) 06/30/2016  . Hemiplegia affecting dominant side ([place]) 06/30/2016  . History of bladder cancer   . Dysphagia   . Hyperglycemia   . Acute renal failure superimposed on stage 3 chronic kidney disease ([place])   . Acute blood loss anemia   . Leukocytosis   . Weakness   . Community acquired pneumonia 06/16/2016  . Unresponsiveness   . CAP (community acquired pneumonia) 06/15/2016  . Paroxysmal atrial fibrillation (  [place]) 06/15/2016  . Pressure injury of skin 06/15/2016  . Lobar pneumonia ([place])   . Acute congestive heart failure ([place])   . Primary osteoarthritis of left knee 07/30/2015  . BPH (benign prostatic hyperplasia) 07/16/2012  . Multiple nodules of lung 09/16/2011  . GERD (gastroesophageal reflux disease) 06/14/2011  . Constipation, chronic 06/14/2011  . NHL (non-Hodgkin's lymphoma) ([place]) 01/03/2010  . Malignant neoplasm of bladder ([place]) 05/28/2007  . Hyperlipidemia 05/28/2007  . HIATAL HERNIA 05/28/2007  . BARRETT'S ESOPHAGUS, HX OF 05/28/2007    Past Surgical History:  Procedure Laterality Date  . APPENDECTOMY    .  bone spur     left shoulder  . CHOLECYSTECTOMY    . KNEE ARTHROPLASTY Left 07/30/2015   Procedure: LEFT TOTAL KNEE ARTHROPLASTY WITH COMPUTER NAVIGATION;  Surgeon: Rod Can, MD;  Location: [facility] ORS;  Service: Orthopedics;  Laterality: Left;  . LASER OF PROSTATE W/ GREEN LIGHT PVP    . TENDON REPAIR     right arm  . TRIGGER FINGER RELEASE     bilateral hands  . WRIST SURGERY     Bilateral        Home Medications    Prior to Admission medications   Medication Sig Start Date End Date Taking? Authorizing Provider  amoxicillin-clavulanate (AUGMENTIN) 875-125 MG tablet Take 1 tablet by mouth 2 (two) times daily.   Yes [provider]  apixaban (ELIQUIS) 5 MG TABS tablet Take 5 mg by mouth 2 (two) times daily.   Yes [provider]  calcium carbonate (TUMS - DOSED IN MG ELEMENTAL CALCIUM) 500 MG chewable tablet Chew 2 tablets by mouth every 8 (eight) hours.   Yes [provider]  carbidopa-levodopa (SINEMET IR) 25-100 MG tablet Take 1 tablet by mouth 3 (three) times daily. Take at 8am, 12noon, 5pm 05/14/18  Yes Marcial Pacas, MD  Cholecalciferol (EQL VITAMIN D3) 25 MCG (1000 UT) tablet Take 2,000 Units by mouth daily. Take 2 tabs (2000 units total) once a day   Yes [provider]  esomeprazole (NEXIUM) 40 MG capsule TAKE 1 CAPSULE (40 MG TOTAL) BY MOUTH 2 (TWO) TIMES DAILY BEFORE A MEAL. 03/17/16  Yes Irene Shipper, MD  ferrous sulfate 325 (65 FE) MG tablet Take 325 mg by mouth daily with breakfast.   Yes [provider]  fluticasone (FLONASE) 50 MCG/ACT nasal spray Place 2 sprays into both nostrils daily.   Yes [provider]  furosemide (LASIX) 20 MG tablet Take 1 tablet (20 mg total) by mouth daily. 10/23/18  Yes Rai, Ripudeep K, MD  Melatonin 3 MG TABS Take 3 mg by mouth at bedtime.   Yes [provider]  memantine (NAMENDA) 10 MG tablet Take 1 tablet (10 mg total) by mouth 2 (two) times daily. 05/14/18  Yes Marcial Pacas, MD   metoprolol tartrate (LOPRESSOR) 25 MG tablet Take 0.5 tablets (12.5 mg total) by mouth 2 (two) times daily. 10/23/18  Yes Rai, Ripudeep K, MD  potassium chloride SA (K-DUR) 20 MEQ tablet Take 2 tablets (40 mEq total) by mouth daily. 10/24/18 12/23/18 Yes Kyle, Tyrone A, DO  simvastatin (ZOCOR) 20 MG tablet Take 20 mg by mouth every evening.    Yes [provider]  Specialty Vitamins Products (ICAPS LUTEIN-ZEAXANTHIN PO) Take 1 tablet by mouth 2 (two) times daily.   Yes [provider]  tamsulosin (FLOMAX) 0.4 MG CAPS capsule Take 0.4 mg by mouth at bedtime.   Yes [provider]  vancomycin (VANCOCIN) 50 mg/mL  oral solution Take 2.5 mLs (125 mg total) by mouth 4 (four) times daily for 12 days. 10/24/18 11/05/18 Yes Jonnie Finner, DO    Family History Family History  Problem Relation Age of Onset  . Heart attack Father   . Other Mother        "old age"    Social History Social History   Tobacco Use  . Smoking status: Former Smoker    Quit date: 10/29/1966    Years since quitting: 52.0  . Smokeless tobacco: Former Systems developer    Quit date: 06/29/1998  Substance Use Topics  . Alcohol use: Yes    Comment: Socially  . Drug use: No     Allergies   Patient has no known allergies.   Review of Systems Review of Systems  Unable to perform ROS: Mental status change  Neurological: Positive for weakness.  All other systems reviewed and are negative.    Physical Exam Updated Vital Signs BP (!) 101/53   Pulse 91   Temp (!) 102.4 F (39.1 C) (Rectal)   Resp (!) 30   SpO2 98%   Physical Exam Vitals signs and nursing note reviewed.  Constitutional:      Comments: Chronically ill   HENT:     Head: Normocephalic.     Nose: Nose normal.     Mouth/Throat:     Mouth: Mucous membranes are dry.  Eyes:     Extraocular Movements: Extraocular movements intact.     Pupils: Pupils are equal, round, and reactive to light.  Neck:     Musculoskeletal: Normal range of  motion.  Cardiovascular:     Rate and Rhythm: Regular rhythm. Tachycardia present.     Pulses: Normal pulses.  Pulmonary:     Comments: Crackles bilaterally  Abdominal:     General: Abdomen is flat.     Palpations: Abdomen is soft.  Musculoskeletal:     Comments: Stage 1 sacral decub ulcer   Skin:    General: Skin is warm.     Capillary Refill: Capillary refill takes less than 2 seconds.  Neurological:     General: No focal deficit present.     Mental Status: He is alert.  Psychiatric:        Mood and Affect: Mood normal.      ED Treatments / Results  Labs (all labs ordered are listed, but only abnormal results are displayed) Labs Reviewed  COMPREHENSIVE METABOLIC PANEL - Abnormal; Notable for the following components:      Result Value   Glucose, Bld 102 (*)    BUN 29 (*)    Creatinine, Ser 2.51 (*)    Calcium 8.3 (*)    Albumin 2.3 (*)    Alkaline Phosphatase 29 (*)    GFR calc non Af Amer 22 (*)    GFR calc Af Amer 25 (*)    All other components within normal limits  CBC WITH DIFFERENTIAL/PLATELET - Abnormal; Notable for the following components:   WBC 13.3 (*)    Neutro Abs 11.4 (*)    Abs Immature Granulocytes 0.13 (*)    All other components within normal limits  APTT - Abnormal; Notable for the following components:   aPTT 43 (*)    All other components within normal limits  PROTIME-INR - Abnormal; Notable for the following components:   Prothrombin Time 32.5 (*)    INR 3.2 (*)    All other components within normal limits  URINALYSIS, ROUTINE W REFLEX MICROSCOPIC -  Abnormal; Notable for the following components:   Color, Urine AMBER (*)    APPearance HAZY (*)    Ketones, ur 5 (*)    All other components within normal limits  SARS CORONAVIRUS 2 (HOSPITAL ORDER, [facility])  CULTURE, BLOOD (ROUTINE X 2)  CULTURE, BLOOD (ROUTINE X 2)  URINE CULTURE  LACTIC ACID, PLASMA  LACTIC ACID, PLASMA    EKG None  Radiology Dg  Chest Port 1 View  Result Date: 10/26/2018 CLINICAL DATA:  Fever.  Altered mental status. EXAM: PORTABLE CHEST 1 VIEW COMPARISON:  10/22/2018. FINDINGS: Cardiomegaly with bilateral pulmonary infiltrates/edema and left-sided pleural effusion. Slight improvement in aeration in the right lung base. Pulmonary edema and/or pneumonia could present this fashion. IMPRESSION: Cardiomegaly with bilateral pulmonary infiltrates/edema left-sided pleural effusion. Slight improvement aeration in the right lung base on today's exam. Electronically Signed   By: Marcello Moores  Register   On: 10/26/2018 16:59    Procedures Procedures (including critical care time)  Medications Ordered in ED Medications  vancomycin (VANCOCIN) IVPB 1000 mg/200 mL premix (1,000 mg Intravenous New Bag/Given 10/26/18 1809)  sodium chloride 0.9 % bolus 1,000 mL (0 mLs Intravenous Stopped 10/26/18 1808)  acetaminophen (TYLENOL) suppository 650 mg (650 mg Rectal Given 10/26/18 1724)  ceFEPIme (MAXIPIME) 2 g in sodium chloride 0.9 % 100 mL IVPB (0 g Intravenous Stopped 10/26/18 1808)     Initial Impression / Assessment and Plan / ED Course  I have reviewed the triage vital signs and the nursing notes.  Pertinent labs & imaging results that were available during my care of the patient were reviewed by me and considered in my medical decision making (see chart for details).        Jason [name] is a 85 y.o. male here with AMS, failure to thrive.  Patient is febrile and tachycardic in the ED.  He has history of recurrent pneumonia and is still on Augmentin and had recent admissions for this.  I am concerned for sepsis secondary to pneumonia or UTI.  Will do sepsis work-up with labs, UA, CXR. Will need admission again.   6:44 PM Patient's WBC is 13. Patient has AKI with Cr 2.5. CXR showed pneumonia.  Given broad-spectrum antibiotics.  Patient is DNR per paperwork.  Hospitalist will admit for sepsis, AKI, HCAP    Final Clinical Impressions(s) /  ED Diagnoses   Final diagnoses:  None    ED Discharge Orders    None       Drenda Freeze, MD 10/26/18 1844

## 2018-10-26 NOTE — ED Notes (Signed)
Called to give report, nurse unable talk at this time will call again.

## 2018-10-26 NOTE — H&P (Signed)
History and Physical    Jason [name] ZOX:[id number] DOB: [birth year] DOA: 10/26/2018  PCP: [name], MD  Patient coming from: [name]  I have personally briefly reviewed patient's old medical records in [place]  Chief Complaint: Generalized weakness, lethargy, failure to thrive  HPI: Jason [name] is a 85 y.o. male with medical history significant for chronic diastolic CHF, paroxysmal atrial fibrillation on Eliquis, history of non-Hodgkin's lymphoma, bladder cancer, history of CVA, hyperlipidemia, CKD stage III, GERD, Parkinson's disease with dementia, and dysphagia who presents to the ED from [facility] for generalized weakness, lethargy, and failure to thrive.  History is limited from patient and is supplemented from EDP, chart review, and daughter by phone.  Patient was recently hospitalized from 10/18/2018-10/23/2018 acute on chronic stage III kidney injury thought due to dehydration.  He was treated with IV fluid hydration with creatinine returning to his baseline of 1.58 on discharge.  He was found to have C. difficile diarrhea and started on oral vancomycin treatment with first doses beginning 10/20/2018.  He was also noted to have dysphagia and suspected aspiration pneumonia for which he is being treated with Augmentin.  Palliative care were consulted and with family discussion patient was made DNR with plans to return to his ALF with referral to hospice care.  Per EDP, patient was noted to be lethargic and weak at his assisted living facility.  EMS were called and he was noted to be hypotensive with systolic blood pressures in the 90s to low 100s.  He reportedly had one episode of diarrhea at his facility.  Per daughter, the hospice care team was plan to see the patient today at his ALF however patient was transported to the ED prior to being seen.  Patient currently states he is feeling a little bit better.  He states that he is having one episode of  diarrhea per day.  He currently denies any chest pain or dyspnea.   ED Course:  Initial vitals showed BP 120/46, pulse 101, RR 16, temp 97.9 Fahrenheit, SPO2 99% on 2 L supplemental O2 via [place].  Rectal temperature is 102.4 F.  Labs are notable for BUN 29, creatinine 2.51, sodium 139, potassium 5.1, bicarb 22, WBC 13.3, hemoglobin 13.6, platelets 203,000, lactic acid 1.7.  Urinalysis was negative for UTI.  Blood cultures were drawn and pending.  SARS-CoV-2 test is negative.  Portable chest x-ray showed bilateral pulmonary infiltrates/edema with left-sided pleural effusion and improved aeration in the right lung base compared to prior.  Patient was given 1 L normal saline and IV vancomycin and cefepime.  The hospitalist service was consulted admit for further evaluation and management.   Review of Systems: All systems reviewed and are negative except as documented in history of present illness above.   Past Medical History:  Diagnosis Date  . Arthritis   . Barrett esophagus   . Bladder cancer ([place]) dx'd 8119,1478   surg only  . CHF (congestive heart failure) ([place])   . Colon polyps    adenomatous  . Difficulty sleeping    OCCASIONALLY  . Diverticulosis of colon (without mention of hemorrhage)   . Frequency of urination   . GERD (gastroesophageal reflux disease)   . Hiatal hernia   . History of bladder cancer 1981   EXCISION ONLY  . History of skin cancer    BASAL CELL  . Hyperlipidemia   . Lymphoma, non-Hodgkin's ([place]) 2011   [place]   . Macular degeneration  LEFT EYE  . Multiple nodules of lung 09/16/2011  . PAF (paroxysmal atrial fibrillation) ([place])   . Personal history of colonic polyps 07/15/2010   tubular adenoma  . Prostate cancer [facility])    patient denies  . PUD (peptic ulcer disease)   . Stomach cancer ([place])    [employer]HL origin  . Stroke ([place])   . Weakness     Past Surgical History:  Procedure Laterality Date  . APPENDECTOMY    . bone spur     left  shoulder  . CHOLECYSTECTOMY    . KNEE ARTHROPLASTY Left 07/30/2015   Procedure: LEFT TOTAL KNEE ARTHROPLASTY WITH COMPUTER NAVIGATION;  Surgeon: Rod Can, MD;  Location: [facility] ORS;  Service: Orthopedics;  Laterality: Left;  . LASER OF PROSTATE W/ GREEN LIGHT PVP    . TENDON REPAIR     right arm  . TRIGGER FINGER RELEASE     bilateral hands  . WRIST SURGERY     Bilateral    Social History:  reports that he quit smoking about 52 years ago. He quit smokeless tobacco use about 20 years ago. He reports current alcohol use. He reports that he does not use drugs.  No Known Allergies  Family History  Problem Relation Age of Onset  . Heart attack Father   . Other Mother        "old age"     Prior to Admission medications   Medication Sig Start Date End Date Taking? Authorizing Provider  amoxicillin-clavulanate (AUGMENTIN) 875-125 MG tablet Take 1 tablet by mouth 2 (two) times daily.   Yes [provider]  apixaban (ELIQUIS) 5 MG TABS tablet Take 5 mg by mouth 2 (two) times daily.   Yes [provider]  calcium carbonate (TUMS - DOSED IN MG ELEMENTAL CALCIUM) 500 MG chewable tablet Chew 2 tablets by mouth every 8 (eight) hours.   Yes [provider]  carbidopa-levodopa (SINEMET IR) 25-100 MG tablet Take 1 tablet by mouth 3 (three) times daily. Take at 8am, 12noon, 5pm 05/14/18  Yes Marcial Pacas, MD  Cholecalciferol (EQL VITAMIN D3) 25 MCG (1000 UT) tablet Take 2,000 Units by mouth daily. Take 2 tabs (2000 units total) once a day   Yes [provider]  esomeprazole (NEXIUM) 40 MG capsule TAKE 1 CAPSULE (40 MG TOTAL) BY MOUTH 2 (TWO) TIMES DAILY BEFORE A MEAL. 03/17/16  Yes Irene Shipper, MD  ferrous sulfate 325 (65 FE) MG tablet Take 325 mg by mouth daily with breakfast.   Yes [provider]  fluticasone (FLONASE) 50 MCG/ACT nasal spray Place 2 sprays into both nostrils daily.   Yes [provider]  furosemide (LASIX) 20 MG tablet Take 1  tablet (20 mg total) by mouth daily. 10/23/18  Yes Rai, Ripudeep K, MD  Melatonin 3 MG TABS Take 3 mg by mouth at bedtime.   Yes [provider]  memantine (NAMENDA) 10 MG tablet Take 1 tablet (10 mg total) by mouth 2 (two) times daily. 05/14/18  Yes Marcial Pacas, MD  metoprolol tartrate (LOPRESSOR) 25 MG tablet Take 0.5 tablets (12.5 mg total) by mouth 2 (two) times daily. 10/23/18  Yes Rai, Ripudeep K, MD  potassium chloride SA (K-DUR) 20 MEQ tablet Take 2 tablets (40 mEq total) by mouth daily. 10/24/18 12/23/18 Yes Kyle, Tyrone A, DO  simvastatin (ZOCOR) 20 MG tablet Take 20 mg by mouth every evening.    Yes [provider]  Specialty Vitamins Products (ICAPS LUTEIN-ZEAXANTHIN PO) Take 1  tablet by mouth 2 (two) times daily.   Yes [provider]  tamsulosin (FLOMAX) 0.4 MG CAPS capsule Take 0.4 mg by mouth at bedtime.   Yes [provider]  vancomycin (VANCOCIN) 50 mg/mL oral solution Take 2.5 mLs (125 mg total) by mouth 4 (four) times daily for 12 days. 10/24/18 11/05/18 Yes Jonnie Finner, DO    Physical Exam: Vitals:   10/26/18 1725 10/26/18 1800 10/26/18 1851 10/26/18 1900  BP: (!) 101/53 (!) 91/47  (!) 98/54  Pulse: 91 (!) 107  (!) 103  Resp: (!) 30 (!) 31  (!) 29  Temp:   (!) 101 F (38.3 C)   TempSrc:   Oral   SpO2: 98% 98%  99%    Constitutional: Elderly man resting supine in bed, NAD, calm, appears tired Eyes: PERRL, lids and conjunctivae normal ENMT: Mucous membranes are dry. Posterior pharynx clear of any exudate or lesions.Normal dentition.  Neck: normal, supple, no masses. Respiratory: Bibasilar inspiratory crackles.  Normal respiratory effort. No accessory muscle use.  Cardiovascular: Irregularly irregular, no murmurs / rubs / gallops. No extremity edema. 2+ pedal pulses. Abdomen: Obese abdomen, no tenderness, no masses palpated. No hepatosplenomegaly. Bowel sounds positive.  Musculoskeletal: no clubbing / cyanosis. No joint deformity upper and  lower extremities.No contractures. Normal muscle tone.  Skin: no rashes. No induration Neurologic: Lethargic but following simple commands, sensation appears intact, moving all extremities spontaneously. Psychiatric: Lethargic but awakens easily to voice.    Labs on Admission: I have personally reviewed following labs and imaging studies  CBC: Recent Labs  Lab 10/20/18 0559 10/21/18 0527 10/22/18 0458 10/26/18 1633  WBC 11.3* 7.7 5.4 13.3*  NEUTROABS  --   --   --  11.4*  HGB 12.8* 12.7* 11.4* 13.6  HCT 43.2 41.6 37.7* 43.7  MCV 90.8 88.9 88.3 87.6  PLT 142* 154 164 578   Basic Metabolic Panel: Recent Labs  Lab 10/20/18 0559 10/21/18 0527 10/22/18 0458 10/26/18 1633  NA 139 139 140 139  K 4.5 3.8 3.2* 5.1  CL 110 109 112* 108  CO2 19* 22 18* 22  GLUCOSE 76 91 94 102*  BUN 24* 27* 26* 29*  CREATININE 1.39* 1.69* 1.58* 2.51*  CALCIUM 8.0* 8.3* 8.1* 8.3*   GFR: Estimated Creatinine Clearance: 21.7 mL/min (A) (by C-G formula based on SCr of 2.51 mg/dL (H)). Liver Function Tests: Recent Labs  Lab 10/26/18 1633  AST 27  ALT 7  ALKPHOS 29*  BILITOT 0.8  PROT 6.7  ALBUMIN 2.3*   No results for input(s): LIPASE, AMYLASE in the last 168 hours. No results for input(s): AMMONIA in the last 168 hours. Coagulation Profile: Recent Labs  Lab 10/26/18 1633  INR 3.2*   Cardiac Enzymes: No results for input(s): CKTOTAL, CKMB, CKMBINDEX, TROPONINI in the last 168 hours. BNP (last 3 results) No results for input(s): PROBNP in the last 8760 hours. HbA1C: No results for input(s): HGBA1C in the last 72 hours. CBG: No results for input(s): GLUCAP in the last 168 hours. Lipid Profile: No results for input(s): CHOL, HDL, LDLCALC, TRIG, CHOLHDL, LDLDIRECT in the last 72 hours. Thyroid Function Tests: No results for input(s): TSH, T4TOTAL, FREET4, T3FREE, THYROIDAB in the last 72 hours. Anemia Panel: No results for input(s): VITAMINB12, FOLATE, FERRITIN, TIBC, IRON,  RETICCTPCT in the last 72 hours. Urine analysis:    Component Value Date/Time   COLORURINE AMBER (A) 10/26/2018 1633   APPEARANCEUR HAZY (A) 10/26/2018 1633   LABSPEC 1.021 10/26/2018 1633  PHURINE 5.0 10/26/2018 1633   GLUCOSEU NEGATIVE 10/26/2018 1633   HGBUR NEGATIVE 10/26/2018 1633   BILIRUBINUR NEGATIVE 10/26/2018 1633   KETONESUR 5 (A) 10/26/2018 1633   PROTEINUR NEGATIVE 10/26/2018 1633   UROBILINOGEN 1.0 07/07/2011 1942   NITRITE NEGATIVE 10/26/2018 1633   LEUKOCYTESUR NEGATIVE 10/26/2018 1633    Radiological Exams on Admission: Dg Chest Port 1 View  Result Date: 10/26/2018 CLINICAL DATA:  Fever.  Altered mental status. EXAM: PORTABLE CHEST 1 VIEW COMPARISON:  10/22/2018. FINDINGS: Cardiomegaly with bilateral pulmonary infiltrates/edema and left-sided pleural effusion. Slight improvement in aeration in the right lung base. Pulmonary edema and/or pneumonia could present this fashion. IMPRESSION: Cardiomegaly with bilateral pulmonary infiltrates/edema left-sided pleural effusion. Slight improvement aeration in the right lung base on today's exam. Electronically Signed   By: Marcello Moores  Register   On: 10/26/2018 16:59    EKG: Ordered and pending.  Assessment/Plan Principal Problem:   Acute renal failure superimposed on stage 3 chronic kidney disease (HCC) Active Problems:   Paroxysmal atrial fibrillation (HCC)   Dysphagia   Parkinson's disease (HCC)   Chronic diastolic (congestive) heart failure (HCC)   Aspiration pneumonia (HCC)   Decubitus ulcer of sacral region, stage 1   C. difficile diarrhea  Jason [name] is a 85 y.o. male with medical history significant for chronic diastolic CHF, paroxysmal atrial fibrillation on Eliquis, history of non-Hodgkin's lymphoma, bladder cancer, history of CVA, hyperlipidemia, CKD stage III, GERD, Parkinson's disease with dementia, and dysphagia who is admitted with acute on chronic kidney injury and continued deconditioning and failure to  thrive.    Acute on chronic stage III kidney injury: Suspect prerenal from ongoing GI loss from C. difficile diarrhea.  He was given 1 L normal saline in the ED.  Volume status is tenuous with pulmonary edema seen on chest x-ray.  Will hold further fluids for now and repeat labs in a.m.  Dysphagia with aspiration pneumonia: -Will switch antibiotics to IV Unasyn for now, de-escalate as able -Follow-up blood cultures -Continue dysphagia 1 diet  C. difficile diarrhea: -Continue oral vancomycin 125 mg 4 times daily -Monitor stool output, if having continued frequent diarrhea may need to adjust therapy  Chronic diastolic CHF: EF 95-63% with G1DD by echocardiogram 06/11/2016.  -Holding home Lasix with AKI -Monitor strict I/O's Daily weights  Paroxysmal atrial fibrillation: Remains in A. fib with controlled rate by exam on admission.  Will reduce home Eliquis to 2.5 twice daily due to renal dysfunction.  Holding home Lopressor due to hypotension.  GERD: -Holding home PPI in setting of C. difficile  BPH: -Continue tamsulosin  Stage I decubitus ulcer of sacral region: -Continue wound care  Parkinson's disease with dementia: -Continue Sinemet and Namenda -Delirium precaution  Progressive deconditioning and failure to thrive/goals of care: Currently resides at [name].  Seen by palliative care on recent admission with plans for transition to hospice care.  Low recon social work to facilitate with transition to hospice care.  Discussed plan with patient's daughter who is agreeable.  DVT prophylaxis: Eliquis Code Status: DNR/DNI, confirmed with patient's daughter Jason [name] by phone [phone] Family Communication: Discussed with patient's daughter Jason [name] by phone [phone] Disposition Plan: Pending clinical progress, likely discharge back to ALF versus residential/home hospice Consults called: None Admission status: Admit - It is my clinical opinion that admission  to INPATIENT is reasonable and necessary because of the expectation that this patient will require hospital care that crosses at least 2 midnights to treat this condition based  on the medical complexity of the problems presented.  Given the aforementioned information, the predictability of an adverse outcome is felt to be significant.      Zada Finders MD [facility]  If 7PM-7AM, please contact night-coverage [email or web address]  10/26/2018, 8:04 PM

## 2018-10-26 NOTE — ED Notes (Signed)
ED TO INPATIENT HANDOFF REPORT  ED Nurse Name and Phone #: Christinia Gully Name/Age/Gender Jason [name] 85 y.o. male Room/Bed: WA11/WA11  Code Status   Code Status: DNR  Home/SNF/Other Given to floor Patient oriented to: self,and situation Is this baseline? Yes   Triage Complete: Triage complete  Chief Complaint weakness  Triage Note EMS reports from [place], called out for generalized weakness and lethargy x 2 days, failure to thrive.  BP 98/60 HR 100 RR 16 Sp02 97 2ltrs CBG 149  28ml NS enroute 22ga R forearm.   Allergies No Known Allergies  Level of Care/Admitting Diagnosis ED Disposition    ED Disposition Condition Comment   Admit  Hospital Area: [facility] [[id number]]  Level of Care: Stepdown [14]  Admit to SDU based on following criteria: Other see comments  Comments: Hypotension  Covid Evaluation: Confirmed COVID Negative  Diagnosis: Acute on chronic renal failure [facility]) [[id number]]  Admitting Physician: Lenore Cordia [[id number]]  Attending Physician: Lenore Cordia [[id number]]  Estimated length of stay: past midnight tomorrow  Certification:: I certify this patient will need inpatient services for at least 2 midnights  PT Class (Do Not Modify): Inpatient [101]  PT Acc Code (Do Not Modify): Private [1]       B Medical/Surgery History Past Medical History:  Diagnosis Date  . Arthritis   . Barrett esophagus   . Bladder cancer ([place]) dx'd 5631,4970   surg only  . CHF (congestive heart failure) ([place])   . Colon polyps    adenomatous  . Difficulty sleeping    OCCASIONALLY  . Diverticulosis of colon (without mention of hemorrhage)   . Frequency of urination   . GERD (gastroesophageal reflux disease)   . Hiatal hernia   . History of bladder cancer 1981   EXCISION ONLY  . History of skin cancer    BASAL CELL  . Hyperlipidemia   . Lymphoma, non-Hodgkin's ([place]) 2011   [place]   . Macular degeneration    LEFT EYE  .  Multiple nodules of lung 09/16/2011  . PAF (paroxysmal atrial fibrillation) ([place])   . Personal history of colonic polyps 07/15/2010   tubular adenoma  . Prostate cancer [facility])    patient denies  . PUD (peptic ulcer disease)   . Stomach cancer ([place])    NHL origin  . Stroke ([place])   . Weakness    Past Surgical History:  Procedure Laterality Date  . APPENDECTOMY    . bone spur     left shoulder  . CHOLECYSTECTOMY    . KNEE ARTHROPLASTY Left 07/30/2015   Procedure: LEFT TOTAL KNEE ARTHROPLASTY WITH COMPUTER NAVIGATION;  Surgeon: Rod Can, MD;  Location: [facility];  Service: Orthopedics;  Laterality: Left;  . LASER OF PROSTATE W/ GREEN LIGHT PVP    . TENDON REPAIR     right arm  . TRIGGER FINGER RELEASE     bilateral hands  . WRIST SURGERY     Bilateral     A IV Location/Drains/Wounds Patient Lines/Drains/Airways Status   Active Line/Drains/Airways    Name:   Placement date:   Placement time:   Site:   Days:   Peripheral IV 10/26/18 Anterior;Right Forearm   10/26/18    1439    Forearm   less than 1   Peripheral IV 10/26/18 Right Antecubital   10/26/18    1638    Antecubital   less than 1  Intake/Output Last 24 hours  Intake/Output Summary (Last 24 hours) at 10/26/2018 2045 Last data filed at 10/26/2018 1927 Gross per 24 hour  Intake 1300 ml  Output -  Net 1300 ml    Labs/Imaging Results for orders placed or performed during the hospital encounter of 10/26/18 (from the past 48 hour(s))  SARS Coronavirus 2 (CEPHEID - Performed in [place] hospital lab), Hosp Order     Status: None   Collection Time: 10/26/18  3:54 PM   Specimen: Urine, Clean Catch; Nasopharyngeal  Result Value Ref Range   SARS Coronavirus 2 NEGATIVE NEGATIVE    Comment: (NOTE) If result is NEGATIVE SARS-CoV-2 target nucleic acids are NOT DETECTED. The SARS-CoV-2 RNA is generally detectable in upper and lower  respiratory specimens during the acute phase of infection. The lowest   concentration of SARS-CoV-2 viral copies this assay can detect is 250  copies / mL. A negative result does not preclude SARS-CoV-2 infection  and should not be used as the sole basis for treatment or other  patient management decisions.  A negative result may occur with  improper specimen collection / handling, submission of specimen other  than nasopharyngeal swab, presence of viral mutation(s) within the  areas targeted by this assay, and inadequate number of viral copies  (<250 copies / mL). A negative result must be combined with clinical  observations, patient history, and epidemiological information. If result is POSITIVE SARS-CoV-2 target nucleic acids are DETECTED. The SARS-CoV-2 RNA is generally detectable in upper and lower  respiratory specimens dur ing the acute phase of infection.  Positive  results are indicative of active infection with SARS-CoV-2.  Clinical  correlation with patient history and other diagnostic information is  necessary to determine patient infection status.  Positive results do  not rule out bacterial infection or co-infection with other viruses. If result is PRESUMPTIVE POSTIVE SARS-CoV-2 nucleic acids MAY BE PRESENT.   A presumptive positive result was obtained on the submitted specimen  and confirmed on repeat testing.  While 2019 novel coronavirus  (SARS-CoV-2) nucleic acids may be present in the submitted sample  additional confirmatory testing may be necessary for epidemiological  and / or clinical management purposes  to differentiate between  SARS-CoV-2 and other Sarbecovirus currently known to infect humans.  If clinically indicated additional testing with an alternate test  methodology [phone]) is advised. The SARS-CoV-2 RNA is generally  detectable in upper and lower respiratory sp ecimens during the acute  phase of infection. The expected result is Negative. Fact Sheet for Patients:  StrictlyIdeas.no Fact Sheet  for Healthcare Providers: BankingDealers.co.za This test is not yet approved or cleared by the [place] FDA and has been authorized for detection and/or diagnosis of SARS-CoV-2 by FDA under an Emergency Use Authorization (EUA).  This EUA will remain in effect (meaning this test can be used) for the duration of the COVID-19 declaration under Section 564(b)(1) of the Act, 21 U.S.C. section 360bbb-3(b)(1), unless the authorization is terminated or revoked sooner. Performed at [facility], [place] [address], [place], [place] [address]   Lactic acid, plasma     Status: None   Collection Time: 10/26/18  4:33 PM  Result Value Ref Range   Lactic Acid, Venous 1.7 0.5 - 1.9 mmol/L    Comment: Performed at [facility], [place] [address], [place], [place] [address]  Comprehensive metabolic panel     Status: Abnormal   Collection Time: 10/26/18  4:33 PM  Result Value Ref  Range   Sodium 139 135 - 145 mmol/L   Potassium 5.1 3.5 - 5.1 mmol/L   Chloride 108 98 - 111 mmol/L   CO2 22 22 - 32 mmol/L   Glucose, Bld 102 (H) 70 - 99 mg/dL   BUN 29 (H) 8 - 23 mg/dL   Creatinine, Ser 2.51 (H) 0.61 - 1.24 mg/dL   Calcium 8.3 (L) 8.9 - 10.3 mg/dL   Total Protein 6.7 6.5 - 8.1 g/dL   Albumin 2.3 (L) 3.5 - 5.0 g/dL   AST 27 15 - 41 U/L   ALT 7 0 - 44 U/L   Alkaline Phosphatase 29 (L) 38 - 126 U/L   Total Bilirubin 0.8 0.3 - 1.2 mg/dL   GFR calc non Af Amer 22 (L) >60 mL/min   GFR calc Af Amer 25 (L) >60 mL/min   Anion gap 9 5 - 15    Comment: Performed at [facility] [place] [address], [place], [place] [address]  CBC WITH DIFFERENTIAL     Status: Abnormal   Collection Time: 10/26/18  4:33 PM  Result Value Ref Range   WBC 13.3 (H) 4.0 - 10.5 K/uL   RBC 4.99 4.22 - 5.81 MIL/uL   Hemoglobin 13.6 13.0 - 17.0 g/dL   HCT 43.7 39.0 - 52.0 %   MCV 87.6 80.0 - 100.0 fL   MCH 27.3 26.0 - 34.0 pg   MCHC 31.1 30.0 - 36.0 g/dL   RDW  15.0 11.5 - 15.5 %   Platelets 203 150 - 400 K/uL   nRBC 0.0 0.0 - 0.2 %   Neutrophils Relative % 86 %   Neutro Abs 11.4 (H) 1.7 - 7.7 K/uL   Lymphocytes Relative 6 %   Lymphs Abs 0.7 0.7 - 4.0 K/uL   Monocytes Relative 7 %   Monocytes Absolute 1.0 0.1 - 1.0 K/uL   Eosinophils Relative 0 %   Eosinophils Absolute 0.0 0.0 - 0.5 K/uL   Basophils Relative 0 %   Basophils Absolute 0.1 0.0 - 0.1 K/uL   WBC Morphology MORPHOLOGY UNREMARKABLE    Immature Granulocytes 1 %   Abs Immature Granulocytes 0.13 (H) 0.00 - 0.07 K/uL    Comment: Performed at [facility], [place] [address], [place], [place] [address]  APTT     Status: Abnormal   Collection Time: 10/26/18  4:33 PM  Result Value Ref Range   aPTT 43 (H) 24 - 36 seconds    Comment:        IF BASELINE aPTT IS ELEVATED, SUGGEST PATIENT RISK ASSESSMENT BE USED TO DETERMINE APPROPRIATE ANTICOAGULANT THERAPY. Performed at [facility], [place] [address], [place], [place] [address]   Protime-INR     Status: Abnormal   Collection Time: 10/26/18  4:33 PM  Result Value Ref Range   Prothrombin Time 32.5 (H) 11.4 - 15.2 seconds   INR 3.2 (H) 0.8 - 1.2    Comment: (NOTE) INR goal varies based on device and disease states. Performed at [facility], [place] [address], [place], [place] [address]   Urinalysis, Routine w reflex microscopic     Status: Abnormal   Collection Time: 10/26/18  4:33 PM  Result Value Ref Range   Color, Urine AMBER (A) YELLOW    Comment: BIOCHEMICALS MAY BE AFFECTED BY COLOR   APPearance HAZY (A) CLEAR   Specific Gravity, Urine 1.021 1.005 - 1.030   pH 5.0 5.0 - 8.0   Glucose, UA NEGATIVE  NEGATIVE mg/dL   Hgb urine dipstick NEGATIVE NEGATIVE   Bilirubin Urine NEGATIVE NEGATIVE   Ketones, ur 5 (A) NEGATIVE mg/dL   Protein, ur NEGATIVE NEGATIVE mg/dL   Nitrite NEGATIVE NEGATIVE   Leukocytes,Ua NEGATIVE NEGATIVE    Comment: Performed at [facility], [place] [address], [place], [place] [address]   Dg Chest Port 1 View  Result Date: 10/26/2018 CLINICAL DATA:  Fever.  Altered mental status. EXAM: PORTABLE CHEST 1 VIEW COMPARISON:  10/22/2018. FINDINGS: Cardiomegaly with bilateral pulmonary infiltrates/edema and left-sided pleural effusion. Slight improvement in aeration in the right lung base. Pulmonary edema and/or pneumonia could present this fashion. IMPRESSION: Cardiomegaly with bilateral pulmonary infiltrates/edema left-sided pleural effusion. Slight improvement aeration in the right lung base on today's exam. Electronically Signed   By: Marcello Moores  Register   On: 10/26/2018 16:59    Pending Labs Unresulted Labs (From admission, onward)    Start     Ordered   10/27/18 0500  CBC  Tomorrow morning,   R     10/26/18 1943   10/27/18 8315  Basic metabolic panel  Tomorrow morning,   R     10/26/18 1943   10/26/18 1553  Lactic acid, plasma  Now then every 2 hours,   STAT     10/26/18 1552   10/26/18 1553  Blood Culture (routine x 2)  BLOOD CULTURE X 2,   STAT     10/26/18 1552   10/26/18 1553  Urine culture  ONCE - STAT,   STAT     10/26/18 1552          Vitals/Pain Today's Vitals   10/26/18 1851 10/26/18 1851 10/26/18 1900 10/26/18 2000  BP:   (!) 98/54 101/64  Pulse:   (!) 103 100  Resp:   (!) 29 (!) 29  Temp: (!) 101 F (38.3 C)     TempSrc: Oral     SpO2:   99% 97%  PainSc:  0-No pain      Isolation Precautions Enteric precautions (UV disinfection)  Medications Medications  sodium chloride flush (NS) 0.9 % injection 3 mL (has no administration in time range)  acetaminophen (TYLENOL) tablet 650 mg (has no administration in time range)    Or  acetaminophen (TYLENOL) suppository 650 mg (has no administration in time range)  ondansetron (ZOFRAN) tablet 4 mg (has no administration in time range)    Or  ondansetron (ZOFRAN) injection 4 mg (has no administration in time range)  ampicillin-sulbactam (UNASYN) 1.5 g in  sodium chloride 0.9 % 100 mL IVPB (has no administration in time range)  apixaban (ELIQUIS) tablet 2.5 mg (has no administration in time range)  carbidopa-levodopa (SINEMET IR) 25-100 MG per tablet immediate release 1 tablet (has no administration in time range)  memantine (NAMENDA) tablet 10 mg (has no administration in time range)  simvastatin (ZOCOR) tablet 20 mg (has no administration in time range)  tamsulosin (FLOMAX) capsule 0.4 mg (has no administration in time range)  vancomycin (VANCOCIN) 50 mg/mL oral solution 125 mg (has no administration in time range)  sodium chloride 0.9 % bolus 1,000 mL (0 mLs Intravenous Stopped 10/26/18 1808)  acetaminophen (TYLENOL) suppository 650 mg (650 mg Rectal Given 10/26/18 1724)  vancomycin (VANCOCIN) IVPB 1000 mg/200 mL premix (0 mg Intravenous Stopped 10/26/18 1927)  ceFEPIme (MAXIPIME) 2 g in sodium chloride 0.9 % 100 mL IVPB (0 g Intravenous Stopped 10/26/18 1808)    Mobility non-ambulatory Low fall risk   Focused Assessments Cardiac Assessment  Handoff:  Cardiac Rhythm: Atrial fibrillation Lab Results  Component Value Date   CKTOTAL 125 06/16/2016   TROPONINI 0.04 ([place]) 06/16/2016   No results found for: DDIMER Does the Patient currently have chest pain? No     R Recommendations: See Admitting Provider Note  Report given to:   Additional Notes: Pts MEW score elevated. Floor unable to take at this time.

## 2018-10-26 NOTE — Progress Notes (Signed)
Pharmacy Antibiotic Note  Jason [name] is a 85 y.o. male admitted on 10/26/2018 with c-diff.  Pharmacy has been consulted for vancomycin rectal dosing.  Plan: Vancomycin 500 mg PR q6h F/u when able to tolerate po again     Temp (24hrs), Avg:99.7 F (37.6 C), Min:97.5 F (36.4 C), Max:102.4 F (39.1 C)  Recent Labs  Lab 10/20/18 0559 10/21/18 0527 10/22/18 0458 10/26/18 1633  WBC 11.3* 7.7 5.4 13.3*  CREATININE 1.39* 1.69* 1.58* 2.51*  LATICACIDVEN  --   --   --  1.7    Estimated Creatinine Clearance: 21.7 mL/min (A) (by C-G formula based on SCr of 2.51 mg/dL (H)).    No Known Allergies   Dose adjustments this admission:   Microbiology results:  BCx:   UCx:    Sputum:    MRSA PCR:   Thank you for allowing pharmacy to be a part of this patient's care.  Dorrene German 10/26/2018 10:26 PM

## 2018-10-27 DIAGNOSIS — I5032 Chronic diastolic (congestive) heart failure: Secondary | ICD-10-CM

## 2018-10-27 DIAGNOSIS — A419 Sepsis, unspecified organism: Principal | ICD-10-CM

## 2018-10-27 DIAGNOSIS — J690 Pneumonitis due to inhalation of food and vomit: Secondary | ICD-10-CM

## 2018-10-27 DIAGNOSIS — I480 Paroxysmal atrial fibrillation: Secondary | ICD-10-CM

## 2018-10-27 DIAGNOSIS — G20 Parkinson's disease: Secondary | ICD-10-CM

## 2018-10-27 DIAGNOSIS — A0472 Enterocolitis due to Clostridium difficile, not specified as recurrent: Secondary | ICD-10-CM

## 2018-10-27 DIAGNOSIS — L89151 Pressure ulcer of sacral region, stage 1: Secondary | ICD-10-CM

## 2018-10-27 DIAGNOSIS — R6520 Severe sepsis without septic shock: Secondary | ICD-10-CM

## 2018-10-27 LAB — CBC
HCT: 41.3 % (ref 39.0–52.0)
Hemoglobin: 12 g/dL — ABNORMAL LOW (ref 13.0–17.0)
MCH: 26.7 pg (ref 26.0–34.0)
MCHC: 29.1 g/dL — ABNORMAL LOW (ref 30.0–36.0)
MCV: 91.8 fL (ref 80.0–100.0)
Platelets: 154 10*3/uL (ref 150–400)
RBC: 4.5 MIL/uL (ref 4.22–5.81)
RDW: 15.3 % (ref 11.5–15.5)
WBC: 10.8 10*3/uL — ABNORMAL HIGH (ref 4.0–10.5)
nRBC: 0 % (ref 0.0–0.2)

## 2018-10-27 LAB — BASIC METABOLIC PANEL
Anion gap: 12 (ref 5–15)
BUN: 31 mg/dL — ABNORMAL HIGH (ref 8–23)
CO2: 17 mmol/L — ABNORMAL LOW (ref 22–32)
Calcium: 7.9 mg/dL — ABNORMAL LOW (ref 8.9–10.3)
Chloride: 112 mmol/L — ABNORMAL HIGH (ref 98–111)
Creatinine, Ser: 2.26 mg/dL — ABNORMAL HIGH (ref 0.61–1.24)
GFR calc Af Amer: 29 mL/min — ABNORMAL LOW (ref >60)
GFR calc non Af Amer: 25 mL/min — ABNORMAL LOW (ref >60)
Glucose, Bld: 96 mg/dL (ref 70–99)
Potassium: 4.8 mmol/L (ref 3.5–5.1)
Sodium: 141 mmol/L (ref 135–145)

## 2018-10-27 LAB — URINE CULTURE: Culture: NO GROWTH

## 2018-10-27 LAB — MRSA PCR SCREENING: MRSA by PCR: NEGATIVE

## 2018-10-27 MED ORDER — SODIUM CHLORIDE 0.9 % IV SOLN
1.0000 g | INTRAVENOUS | Status: DC
  Administered 2018-10-27 – 2018-10-28 (×2): 1 g via INTRAVENOUS
  Filled 2018-10-27 (×2): qty 1
  Filled 2018-10-27: qty 10

## 2018-10-27 MED ORDER — METRONIDAZOLE IN NACL 5-0.79 MG/ML-% IV SOLN
500.0000 mg | Freq: Three times a day (TID) | INTRAVENOUS | Status: DC
  Administered 2018-10-27 – 2018-10-29 (×7): 500 mg via INTRAVENOUS
  Filled 2018-10-27 (×7): qty 100

## 2018-10-27 MED ORDER — RESOURCE THICKENUP CLEAR PO POWD
ORAL | Status: DC | PRN
  Filled 2018-10-27 (×2): qty 125

## 2018-10-27 MED ORDER — METOPROLOL TARTRATE 12.5 MG HALF TABLET
12.5000 mg | ORAL_TABLET | Freq: Two times a day (BID) | ORAL | Status: DC
  Administered 2018-10-27 – 2018-10-30 (×7): 12.5 mg via ORAL
  Filled 2018-10-27 (×7): qty 1

## 2018-10-27 MED ORDER — NYSTATIN 100000 UNIT/ML MT SUSP
5.0000 mL | Freq: Four times a day (QID) | OROMUCOSAL | Status: DC
  Administered 2018-10-27 – 2018-10-30 (×11): 500000 [IU] via ORAL
  Filled 2018-10-27 (×11): qty 5

## 2018-10-27 NOTE — Progress Notes (Signed)
Hydrologist [facility])  Hospital Liaison: RN note  This patient was previously referred to [facility] for home hospice services at [facility]. He was readmitted to the hospital prior to his assessment visit.   Chart and patient information  reviewed by [facility] physician. Hospice eligibility confirmed.     Writer spoke with daughter, [name] who states she still wants to proceed with hospice services after discharge. [place] liaison will continue to follow until discharge and [facility] will proceed with hospice services at [facility] after discharge from the hospital.    Please send signed and completed DNR form home with patient/family. Patient will need prescriptions for discharge comfort medications.     No DME requested at this time.     [facility] aware of the above. Please notify [facility] when patient is ready to leave the unit at discharge. (Call [phone] or [phone] after 5pm.) [facility] information and contact numbers given to [facility].    Please call with any hospice related questions.     Thank you for this referral.     Farrel Gordon, RN, CCM  [place] (listed on AMION under [facility] [place] [facility] [place])  [phone]

## 2018-10-27 NOTE — Progress Notes (Signed)
PROGRESS NOTE  Jason [name] YWV:[id number] DOB: Dec 13, 1927 DOA: 10/26/2018 PCP: [name], MD  HPI/Recap of past 24 hours:  He is weak but alert, knows he is at [facility] long Fever coming down , He has Intermittent productive cough  Per RN, patient coughs with oral intake, RN does not think patient can tolerate oral vanc liquid due to aspiration risk , vanc liquid can not be thickened So far , patient  Is able to take pills with apple sauce   Assessment/Plan: Principal Problem:   Acute renal failure superimposed on stage 3 chronic kidney disease ([place]) Active Problems:   Paroxysmal atrial fibrillation (HCC)   Dysphagia   Parkinson's disease ([place])   Chronic diastolic (congestive) heart failure (HCC)   Aspiration pneumonia (HCC)   Decubitus ulcer of sacral region, stage 1   C. difficile diarrhea   Sepsis with fever, leukocytosis, hypotension, tachycardia, tachypnea , metabolic encephalopathy on presentation -source of infection including recurrent aspiration pneumonia and persistent cdiff -he is admitted to stepdown/icu unit , he appear to be more alert this am  Recurrent aspiration pneumonia -patient has long history of dysphagia from prior CVA and progressive parkinson's  -he is currently on unasyn -speech eval pending -family is clear, no feeding tube  Persistent C diff Not able tolerate oral vanc liquid ( only oral vanc available in the hospital) -rectal vanc enema, add Iv flagyl  AKI on CKD: Cr 1.58 on 7/20 Cr 2.5 on presentation Likely due to sepsis Renal dosing meds, monitor cr  Chronic diastolic CHF: EF 85-46% with G1DD by echocardiogram 06/11/2016.  -Holding home Lasix with AKI -Monitor strict I/O's Daily weights  Paroxysmal atrial fibrillation: Remains in A. fib with controlled rate by exam on admission.    home Eliquis dose reduced to 2.5 twice daily due to renal dysfunction.   home Lopressor held on presentation due to hypotension, resume  lopressor with holding parameters now bp is improving  GERD: -Holding home PPI in setting of C. difficile  BPH: -Continue tamsulosin  Stage I decubitus ulcer of sacral region: -Continue wound care  Parkinson's disease with dementia: -Continue Sinemet and Namenda -Delirium precaution   FTT: With recurrent aspiration pneumonia and persistent cdiff, I am concerned that life expectancy may be less than weeks Family is ok to transition to residential hospice if he does not improve in the next 24hrs  Code Status: DNR  Family Communication: patient , daughter over the phone  Disposition Plan: pending   Consultants:  hospice  Procedures:  Rectal vanc enema   Antibiotics:  unasyn    Objective: BP (!) 106/54   Pulse 75   Temp 97.7 F (36.5 C) (Axillary)   Resp 18   Ht 5\' 11"  (1.803 m)   Wt 83.5 kg   SpO2 99%   BMI 25.67 kg/m   Intake/Output Summary (Last 24 hours) at 10/27/2018 1354 Last data filed at 10/27/2018 0200 Gross per 24 hour  Intake 1397.11 ml  Output -  Net 1397.11 ml   Filed Weights   10/27/18 0400  Weight: 83.5 kg    Exam: Patient is examined daily including today on 10/27/2018, exams remain the same as of yesterday except that has changed    General:  Frail, weak, oriented to place and person, confused about the time  Cardiovascular: RRR  Respiratory: diminished, no wheezing  Abdomen: Soft/ND/NT, positive BS  Musculoskeletal: No Edema  Neuro: weak , oriented to place and person, confused about the time  Data Reviewed:  Basic Metabolic Panel: Recent Labs  Lab 10/21/18 0527 10/22/18 0458 10/26/18 1633 10/27/18 0155  NA 139 140 139 141  K 3.8 3.2* 5.1 4.8  CL 109 112* 108 112*  CO2 22 18* 22 17*  GLUCOSE 91 94 102* 96  BUN 27* 26* 29* 31*  CREATININE 1.69* 1.58* 2.51* 2.26*  CALCIUM 8.3* 8.1* 8.3* 7.9*   Liver Function Tests: Recent Labs  Lab 10/26/18 1633  AST 27  ALT 7  ALKPHOS 29*  BILITOT 0.8  PROT 6.7   ALBUMIN 2.3*   No results for input(s): LIPASE, AMYLASE in the last 168 hours. No results for input(s): AMMONIA in the last 168 hours. CBC: Recent Labs  Lab 10/21/18 0527 10/22/18 0458 10/26/18 1633 10/27/18 0155  WBC 7.7 5.4 13.3* 10.8*  NEUTROABS  --   --  11.4*  --   HGB 12.7* 11.4* 13.6 12.0*  HCT 41.6 37.7* 43.7 41.3  MCV 88.9 88.3 87.6 91.8  PLT 154 164 203 154   Cardiac Enzymes:   No results for input(s): CKTOTAL, CKMB, CKMBINDEX, TROPONINI in the last 168 hours. BNP (last 3 results) Recent Labs    12/22/17 2147  BNP 443.8*    ProBNP (last 3 results) No results for input(s): PROBNP in the last 8760 hours.  CBG: No results for input(s): GLUCAP in the last 168 hours.  Recent Results (from the past 240 hour(s))  Blood Culture (routine x 2)     Status: None   Collection Time: 10/18/18  6:36 PM   Specimen: BLOOD  Result Value Ref Range Status   Specimen Description   Final    BLOOD LEFT ANTECUBITAL Performed at [facility] [address], [place], [place] [address]    Special Requests   Final    BOTTLES DRAWN AEROBIC AND ANAEROBIC Blood Culture adequate volume Performed at [facility] [address], [place], [place] [address]    Culture   Final    NO GROWTH 5 DAYS Performed at [facility], [place] [address], [place], [place] [address]    Report Status 10/23/2018 FINAL  Final  SARS Coronavirus 2 (CEPHEID - Performed in [facility] hospital lab), Hosp Order     Status: None   Collection Time: 10/18/18  6:39 PM   Specimen: Nasopharyngeal Swab  Result Value Ref Range Status   SARS Coronavirus 2 NEGATIVE NEGATIVE Final    Comment: (NOTE) If result is NEGATIVE SARS-CoV-2 target nucleic acids are NOT DETECTED. The SARS-CoV-2 RNA is generally detectable in upper and lower  respiratory specimens during the acute phase of infection. The lowest  concentration of SARS-CoV-2 viral copies this assay can detect is 250   copies / mL. A negative result does not preclude SARS-CoV-2 infection  and should not be used as the sole basis for treatment or other  patient management decisions.  A negative result may occur with  improper specimen collection / handling, submission of specimen other  than nasopharyngeal swab, presence of viral mutation(s) within the  areas targeted by this assay, and inadequate number of viral copies  (<250 copies / mL). A negative result must be combined with clinical  observations, patient history, and epidemiological information. If result is POSITIVE SARS-CoV-2 target nucleic acids are DETECTED. The SARS-CoV-2 RNA is generally detectable in upper and lower  respiratory specimens dur ing the acute phase of infection.  Positive  results are indicative of active infection with SARS-CoV-2.  Clinical  correlation with patient history  and other diagnostic information is  necessary to determine patient infection status.  Positive results do  not rule out bacterial infection or co-infection with other viruses. If result is PRESUMPTIVE POSTIVE SARS-CoV-2 nucleic acids MAY BE PRESENT.   A presumptive positive result was obtained on the submitted specimen  and confirmed on repeat testing.  While 2019 novel coronavirus  (SARS-CoV-2) nucleic acids may be present in the submitted sample  additional confirmatory testing may be necessary for epidemiological  and / or clinical management purposes  to differentiate between  SARS-CoV-2 and other Sarbecovirus currently known to infect humans.  If clinically indicated additional testing with an alternate test  methodology [phone]) is advised. The SARS-CoV-2 RNA is generally  detectable in upper and lower respiratory sp ecimens during the acute  phase of infection. The expected result is Negative. Fact Sheet for Patients:  StrictlyIdeas.no Fact Sheet for Healthcare Providers: BankingDealers.co.za  This test is not yet approved or cleared by the [place] FDA and has been authorized for detection and/or diagnosis of SARS-CoV-2 by FDA under an Emergency Use Authorization (EUA).  This EUA will remain in effect (meaning this test can be used) for the duration of the COVID-19 declaration under Section 564(b)(1) of the Act, 21 U.S.C. section 360bbb-3(b)(1), unless the authorization is terminated or revoked sooner. Performed at [facility], [place] [address], [place], [place] [address]   Blood Culture (routine x 2)     Status: None   Collection Time: 10/18/18  6:41 PM   Specimen: BLOOD  Result Value Ref Range Status   Specimen Description   Final    BLOOD RIGHT ANTECUBITAL Performed at [facility] [address] [place], [place] [address]    Special Requests   Final    BOTTLES DRAWN AEROBIC AND ANAEROBIC Blood Culture adequate volume Performed at [facility] [address] [place], [place] [address]    Culture   Final    NO GROWTH 5 DAYS Performed at [facility], [place] [address], [place], [place] [address]    Report Status 10/23/2018 FINAL  Final  Urine culture     Status: None   Collection Time: 10/18/18  7:11 PM   Specimen: In/Out Cath Urine  Result Value Ref Range Status   Specimen Description   Final    IN/OUT CATH URINE Performed at [facility] [address] [place], [place] [address]    Special Requests   Final    NONE Performed at [facility], [place] [address], [place], [place] [address]    Culture   Final    NO GROWTH Performed at [facility], [place] [address], [place], [place] [address]    Report Status 10/20/2018 FINAL  Final  C difficile quick scan w PCR reflex     Status: Abnormal   Collection Time: 10/18/18  9:44 PM   Specimen: STOOL  Result Value Ref Range Status   C Diff antigen POSITIVE (A) NEGATIVE Final   C Diff toxin POSITIVE (A)  NEGATIVE Final   C Diff interpretation Toxin producing C. difficile detected.  Final    Comment: CRITICAL RESULT CALLED TO, READ BACK BY AND VERIFIED WITH: R.Milton Ferguson [id number] @1938  BY V.WILKINS Performed at [facility] [address] [place], [place] [address]   Gastrointestinal Panel by PCR , Stool     Status: None   Collection Time: 10/18/18  9:44 PM   Specimen: STOOL  Result Value Ref Range Status   Campylobacter species NOT DETECTED NOT DETECTED Final   Plesimonas shigelloides NOT DETECTED NOT DETECTED Final   Salmonella species NOT DETECTED NOT DETECTED Final   Yersinia enterocolitica NOT DETECTED NOT DETECTED Final   Vibrio species NOT DETECTED NOT DETECTED Final   Vibrio cholerae NOT DETECTED NOT DETECTED Final   Enteroaggregative E coli (EAEC) NOT DETECTED NOT DETECTED Final   Enteropathogenic E coli (EPEC) NOT DETECTED NOT DETECTED Final   Enterotoxigenic E coli (ETEC) NOT DETECTED NOT DETECTED Final   Shiga like toxin producing E coli (STEC) NOT DETECTED NOT DETECTED Final   Shigella/Enteroinvasive E coli (EIEC) NOT DETECTED NOT DETECTED Final   Cryptosporidium NOT DETECTED NOT DETECTED Final   Cyclospora cayetanensis NOT DETECTED NOT DETECTED Final   Entamoeba histolytica NOT DETECTED NOT DETECTED Final   Giardia lamblia NOT DETECTED NOT DETECTED Final   Adenovirus F40/41 NOT DETECTED NOT DETECTED Final   Astrovirus NOT DETECTED NOT DETECTED Final   Norovirus GI/GII NOT DETECTED NOT DETECTED Final   Rotavirus A NOT DETECTED NOT DETECTED Final   Sapovirus (I, II, IV, and V) NOT DETECTED NOT DETECTED Final    Comment: Performed at [facility], [place]., [place], [place] [address]  MRSA PCR Screening     Status: Abnormal   Collection Time: 10/19/18  2:30 AM   Specimen: Nasopharyngeal  Result Value Ref Range Status   MRSA by PCR POSITIVE (A) NEGATIVE Final    Comment:        The GeneXpert MRSA Assay (FDA approved for NASAL specimens  only), is one component of a comprehensive MRSA colonization surveillance program. It is not intended to diagnose MRSA infection nor to guide or monitor treatment for MRSA infections. RESULT CALLED TO, READ BACK BY AND VERIFIED WITH: C.GRIFFITH RN AT 0909 ON 10/19/2018 BY S.VANHOORNE Performed at [facility], [place] [address] [place], [place] [address]   SARS Coronavirus 2 (CEPHEID - Performed in [facility] hospital lab), Hosp Order     Status: None   Collection Time: 10/23/18  2:00 PM   Specimen: Nasopharyngeal Swab  Result Value Ref Range Status   SARS Coronavirus 2 NEGATIVE NEGATIVE Final    Comment: (NOTE) If result is NEGATIVE SARS-CoV-2 target nucleic acids are NOT DETECTED. The SARS-CoV-2 RNA is generally detectable in upper and lower  respiratory specimens during the acute phase of infection. The lowest  concentration of SARS-CoV-2 viral copies this assay can detect is 250  copies / mL. A negative result does not preclude SARS-CoV-2 infection  and should not be used as the sole basis for treatment or other  patient management decisions.  A negative result may occur with  improper specimen collection / handling, submission of specimen other  than nasopharyngeal swab, presence of viral mutation(s) within the  areas targeted by this assay, and inadequate number of viral copies  (<250 copies / mL). A negative result must be combined with clinical  observations, patient history, and epidemiological information. If result is POSITIVE SARS-CoV-2 target nucleic acids are DETECTED. The SARS-CoV-2 RNA is generally detectable in upper and lower  respiratory specimens dur ing the acute phase of infection.  Positive  results are indicative of active infection with SARS-CoV-2.  Clinical  correlation with patient history and other diagnostic information is  necessary to determine patient infection status.  Positive results do  not rule out bacterial infection or  co-infection with other viruses. If result is PRESUMPTIVE POSTIVE SARS-CoV-2  nucleic acids MAY BE PRESENT.   A presumptive positive result was obtained on the submitted specimen  and confirmed on repeat testing.  While 2019 novel coronavirus  (SARS-CoV-2) nucleic acids may be present in the submitted sample  additional confirmatory testing may be necessary for epidemiological  and / or clinical management purposes  to differentiate between  SARS-CoV-2 and other Sarbecovirus currently known to infect humans.  If clinically indicated additional testing with an alternate test  methodology [phone]) is advised. The SARS-CoV-2 RNA is generally  detectable in upper and lower respiratory sp ecimens during the acute  phase of infection. The expected result is Negative. Fact Sheet for Patients:  StrictlyIdeas.no Fact Sheet for Healthcare Providers: BankingDealers.co.za This test is not yet approved or cleared by the [place] FDA and has been authorized for detection and/or diagnosis of SARS-CoV-2 by FDA under an Emergency Use Authorization (EUA).  This EUA will remain in effect (meaning this test can be used) for the duration of the COVID-19 declaration under Section 564(b)(1) of the Act, 21 U.S.C. section 360bbb-3(b)(1), unless the authorization is terminated or revoked sooner. Performed at [facility], [place] [address] [place], [place] [address]   SARS Coronavirus 2 (CEPHEID - Performed in [facility] hospital lab), Hosp Order     Status: None   Collection Time: 10/26/18  3:54 PM   Specimen: Urine, Clean Catch; Nasopharyngeal  Result Value Ref Range Status   SARS Coronavirus 2 NEGATIVE NEGATIVE Final    Comment: (NOTE) If result is NEGATIVE SARS-CoV-2 target nucleic acids are NOT DETECTED. The SARS-CoV-2 RNA is generally detectable in upper and lower  respiratory specimens during the acute phase of infection. The  lowest  concentration of SARS-CoV-2 viral copies this assay can detect is 250  copies / mL. A negative result does not preclude SARS-CoV-2 infection  and should not be used as the sole basis for treatment or other  patient management decisions.  A negative result may occur with  improper specimen collection / handling, submission of specimen other  than nasopharyngeal swab, presence of viral mutation(s) within the  areas targeted by this assay, and inadequate number of viral copies  (<250 copies / mL). A negative result must be combined with clinical  observations, patient history, and epidemiological information. If result is POSITIVE SARS-CoV-2 target nucleic acids are DETECTED. The SARS-CoV-2 RNA is generally detectable in upper and lower  respiratory specimens dur ing the acute phase of infection.  Positive  results are indicative of active infection with SARS-CoV-2.  Clinical  correlation with patient history and other diagnostic information is  necessary to determine patient infection status.  Positive results do  not rule out bacterial infection or co-infection with other viruses. If result is PRESUMPTIVE POSTIVE SARS-CoV-2 nucleic acids MAY BE PRESENT.   A presumptive positive result was obtained on the submitted specimen  and confirmed on repeat testing.  While 2019 novel coronavirus  (SARS-CoV-2) nucleic acids may be present in the submitted sample  additional confirmatory testing may be necessary for epidemiological  and / or clinical management purposes  to differentiate between  SARS-CoV-2 and other Sarbecovirus currently known to infect humans.  If clinically indicated additional testing with an alternate test  methodology [phone]) is advised. The SARS-CoV-2 RNA is generally  detectable in upper and lower respiratory sp ecimens during the acute  phase of infection. The expected result is Negative. Fact Sheet for Patients:  StrictlyIdeas.no  Fact Sheet for Healthcare Providers: BankingDealers.co.za This test is  not yet approved or cleared by the [place] and has been authorized for detection and/or diagnosis of SARS-CoV-2 by FDA under an Emergency Use Authorization (EUA).  This EUA will remain in effect (meaning this test can be used) for the duration of the COVID-19 declaration under Section 564(b)(1) of the Act, 21 U.S.C. section 360bbb-3(b)(1), unless the authorization is terminated or revoked sooner. Performed at [facility], [place] [address], [place], [place] [address]   MRSA PCR Screening     Status: None   Collection Time: 10/26/18  9:49 PM   Specimen: Nasal Mucosa; Nasopharyngeal  Result Value Ref Range Status   MRSA by PCR NEGATIVE NEGATIVE Final    Comment:        The GeneXpert MRSA Assay (FDA approved for NASAL specimens only), is one component of a comprehensive MRSA colonization surveillance program. It is not intended to diagnose MRSA infection nor to guide or monitor treatment for MRSA infections. Performed at [facility], [place] [address], [place], [place] [address]      Studies: Dg Chest Port 1 View  Result Date: 10/26/2018 CLINICAL DATA:  Fever.  Altered mental status. EXAM: PORTABLE CHEST 1 VIEW COMPARISON:  10/22/2018. FINDINGS: Cardiomegaly with bilateral pulmonary infiltrates/edema and left-sided pleural effusion. Slight improvement in aeration in the right lung base. Pulmonary edema and/or pneumonia could present this fashion. IMPRESSION: Cardiomegaly with bilateral pulmonary infiltrates/edema left-sided pleural effusion. Slight improvement aeration in the right lung base on today's exam. Electronically Signed   By: Marcello Moores  Register   On: 10/26/2018 16:59    Scheduled Meds: . apixaban  2.5 mg Oral BID  . carbidopa-levodopa  1 tablet Oral TID WC  . Chlorhexidine Gluconate Cloth  6 each Topical Daily  . mouth rinse  15 mL  Mouth Rinse BID  . memantine  10 mg Oral BID  . nystatin  5 mL Oral QID  . simvastatin  20 mg Oral QPM  . sodium chloride flush  3 mL Intravenous Q12H  . tamsulosin  0.4 mg Oral QHS  . vancomycin (VANCOCIN) rectal ENEMA  500 mg Rectal Q6H    Continuous Infusions: . ampicillin-sulbactam (UNASYN) IV 1.5 g (10/27/18 1229)     Time spent: 37mins, case discussed with hospice liaison  I have personally reviewed and interpreted on  10/27/2018 daily labs, tele strips, imagings as discussed above under date review session and assessment and plans.  I reviewed all nursing notes, pharmacy notes, consultant notes,  vitals, pertinent old records  I have discussed plan of care as described above with RN , patient and family on 10/27/2018   Florencia Reasons MD, PhD  [facility] Pager [phone]. If 7PM-7AM, please contact night-coverage at [email or web address], password [facility] 10/27/2018, 1:54 PM  LOS: 1 day

## 2018-10-28 DIAGNOSIS — N179 Acute kidney failure, unspecified: Secondary | ICD-10-CM

## 2018-10-28 LAB — CBC WITH DIFFERENTIAL/PLATELET
Abs Immature Granulocytes: 0.13 10*3/uL — ABNORMAL HIGH (ref 0.00–0.07)
Basophils Absolute: 0 10*3/uL (ref 0.0–0.1)
Basophils Relative: 0 %
Eosinophils Absolute: 0.5 10*3/uL (ref 0.0–0.5)
Eosinophils Relative: 4 %
HCT: 46.6 % (ref 39.0–52.0)
Hemoglobin: 13.7 g/dL (ref 13.0–17.0)
Immature Granulocytes: 1 %
Lymphocytes Relative: 11 %
Lymphs Abs: 1.4 10*3/uL (ref 0.7–4.0)
MCH: 26.7 pg (ref 26.0–34.0)
MCHC: 29.4 g/dL — ABNORMAL LOW (ref 30.0–36.0)
MCV: 90.8 fL (ref 80.0–100.0)
Monocytes Absolute: 1 10*3/uL (ref 0.1–1.0)
Monocytes Relative: 8 %
Neutro Abs: 9.6 10*3/uL — ABNORMAL HIGH (ref 1.7–7.7)
Neutrophils Relative %: 76 %
Platelets: 166 10*3/uL (ref 150–400)
RBC: 5.13 MIL/uL (ref 4.22–5.81)
RDW: 15.5 % (ref 11.5–15.5)
WBC: 12.6 10*3/uL — ABNORMAL HIGH (ref 4.0–10.5)
nRBC: 0 % (ref 0.0–0.2)

## 2018-10-28 LAB — BASIC METABOLIC PANEL
Anion gap: 12 (ref 5–15)
BUN: 37 mg/dL — ABNORMAL HIGH (ref 8–23)
CO2: 21 mmol/L — ABNORMAL LOW (ref 22–32)
Calcium: 8.1 mg/dL — ABNORMAL LOW (ref 8.9–10.3)
Chloride: 110 mmol/L (ref 98–111)
Creatinine, Ser: 2.02 mg/dL — ABNORMAL HIGH (ref 0.61–1.24)
GFR calc Af Amer: 33 mL/min — ABNORMAL LOW (ref >60)
GFR calc non Af Amer: 29 mL/min — ABNORMAL LOW (ref >60)
Glucose, Bld: 94 mg/dL (ref 70–99)
Potassium: 4 mmol/L (ref 3.5–5.1)
Sodium: 143 mmol/L (ref 135–145)

## 2018-10-28 NOTE — Evaluation (Signed)
Clinical/Bedside Swallow Evaluation Patient Details  Name: Jason [name] MRN: [id number] Date of Birth: 16-May-1927  Today's Date: 10/28/2018 Time: SLP Start Time (ACUTE ONLY): 1330 SLP Stop Time (ACUTE ONLY): 1400 SLP Time Calculation (min) (ACUTE ONLY): 30 min  Past Medical History:  Past Medical History:  Diagnosis Date  . Arthritis   . Barrett esophagus   . Bladder cancer ([place]) dx'd [id number]   surg only  . CHF (congestive heart failure) ([place])   . Colon polyps    adenomatous  . Difficulty sleeping    OCCASIONALLY  . Diverticulosis of colon (without mention of hemorrhage)   . Frequency of urination   . GERD (gastroesophageal reflux disease)   . Hiatal hernia   . History of bladder cancer 1981   EXCISION ONLY  . History of skin cancer    BASAL CELL  . Hyperlipidemia   . Lymphoma, non-Hodgkin's ([place]) 2011   [place]   . Macular degeneration    LEFT EYE  . Multiple nodules of lung 09/16/2011  . PAF (paroxysmal atrial fibrillation) ([place])   . Personal history of colonic polyps 07/15/2010   tubular adenoma  . Prostate cancer [facility])    patient denies  . PUD (peptic ulcer disease)   . Stomach cancer ([place])    [facility]HL origin  . Stroke ([place])   . Weakness    Past Surgical History:  Past Surgical History:  Procedure Laterality Date  . APPENDECTOMY    . bone spur     left shoulder  . CHOLECYSTECTOMY    . KNEE ARTHROPLASTY Left 07/30/2015   Procedure: LEFT TOTAL KNEE ARTHROPLASTY WITH COMPUTER NAVIGATION;  Surgeon: Rod Can, MD;  Location: [facility]S;  Service: Orthopedics;  Laterality: Left;  . LASER OF PROSTATE W/ GREEN LIGHT PVP    . TENDON REPAIR     right arm  . TRIGGER FINGER RELEASE     bilateral hands  . WRIST SURGERY     Bilateral   HPI:  Jason [name] is a 85 y.o. male with medical history significant for chronic diastolic CHF, paroxysmal atrial fibrillation on Eliquis, history of non-Hodgkin's lymphoma, bladder cancer, history of CVA, hyperlipidemia, CKD  stage III, GERD, Parkinson's disease with dementia, and dysphagia who presents to the ED from [facility] for generalized weakness, lethargy, and failure to thrive.  History is limited from patient and is supplemented from EDP, chart review, and daughter by phone.  Patient was recently hospitalized from 10/18/2018-10/23/2018 with acute on chronic stage III kidney injury thought due to dehydration.  He was treated with IV fluid hydration with creatinine returning to his baseline of 1.58 on discharge.  He was found to have C. difficile diarrhea and started on oral vancomycin treatment with first doses beginning 10/20/2018.  He was also noted to have dysphagia and suspected aspiration pneumonia for which he is being treated with Augmentin.  Palliative care were consulted and with family discussion patient was made DNR with plans to return to his ALF with referral to hospice care.  Per EDP, patient was noted to be lethargic and weak at his assisted living facility.  EMS were called and he was noted to be hypotensive with systolic blood pressures in the 90s to low 100s.  He reportedly had one episode of diarrhea at his facility.  Per daughter, the hospice care team was planning to see the patient today at his ALF however patient was transported to the ED prior to being seen.  Patient currently states  he is feeling a little bit better.  He states that he is having one episode of diarrhea per day.  He currently denies any chest pain or dyspnea.  Most recent chest xray is showing cardiomegaly with bilateral pulmonary infiltrates/edema left-sided pleural effusion.  Slight improvement aeration in the right lung base on today's exam   Assessment / Plan / Recommendation Clinical Impression  Clinical swallowing evaluation was completed using thin liquids via spoon, nectar thick liquids via spoon and self fed cup sips and pureed material.  Of note, the patient is known to [place] with MBS completed 10/19/2018 with SILENT  aspiration documented across all textures.  Given thin and nectar thick liquids via spoon sips use of a chin tuck was some helpful to prevent aspiration.  Suggestion at that time was for thin or nectar thick liquids only via spoon sips using a chin tuck.  Per report, the family/patient has no desire for long term non oral nutrition.  Unfortunately the patient has NO recollection of this study or the recommendations.  His ability to recall such strategies is not likely to improve given his history of dementia.  Even though he is capable of feeding himself he would require someone sitting with him his entire meal to cue him to use a chin tuck for every sip of liquid.   And even with such strategies he is still very high risk for aspiration.  This date cranial nerve exam was completed.  Lingual range of motion and strength was decent with mild issues with lingual elevation noted.  Slight droop in the left upper face was seen but labial range of motion and strength appeared adequate.  Results of exam this date were consistent with the findings on the MBS on 10/19/2018.  He appeared to have a delayed swallow trigger and multiple swallows were seen.  He was noted to cough across all textures both immediately and with a delay.  In addition, after this therapist left the room he continued to cough.  At this time, given his history of Parkinsons disease, dementia and known progressive dysphagia prognosis for swallow recovery is likely poor.  The patient reported he dislikes thickened liquids and would like to have regular liquids.  Nursing reported a good appetite and that he was requesting other foods.  It does not appear that the patient fully understands the consequences of his swallowing disorder.   Consider allowing patient to eat/drink what he desires with known risks of aspiration for quality of life/comfort.  ST will follow up for education for the patient and family and to provide assistance as needs with goals of  care.  MD Florencia Reasons was informed of results.   SLP Visit Diagnosis: Dysphagia, oropharyngeal phase (R13.12)    Aspiration Risk  Severe aspiration risk    Diet Recommendation   Safest diet is NPO, If patient/family do not desire aggressive intervention consider dysphagia 1 with nectar or thin liquids with aggressive oral care completed prior to meals.         Other  Recommendations Oral Care Recommendations: Oral care QID      Frequency and Duration min 2x/week  2 weeks       Prognosis Prognosis for Safe Diet Advancement: Guarded Barriers to Reach Goals: Time post onset;Severity of deficits      Swallow Study   General Date of Onset: 10/26/18 HPI: WOOD NOVACEK is a 85 y.o. male with medical history significant for chronic diastolic CHF, paroxysmal atrial fibrillation on Eliquis,  history of non-Hodgkin's lymphoma, bladder cancer, history of CVA, hyperlipidemia, CKD stage III, GERD, Parkinson's disease with dementia, and dysphagia who presents to the ED from [facility] for generalized weakness, lethargy, and failure to thrive.  History is limited from patient and is supplemented from EDP, chart review, and daughter by phone.  Patient was recently hospitalized from 10/18/2018-10/23/2018 with acute on chronic stage III kidney injury thought due to dehydration.  He was treated with IV fluid hydration with creatinine returning to his baseline of 1.58 on discharge.  He was found to have C. difficile diarrhea and started on oral vancomycin treatment with first doses beginning 10/20/2018.  He was also noted to have dysphagia and suspected aspiration pneumonia for which he is being treated with Augmentin.  Palliative care were consulted and with family discussion patient was made DNR with plans to return to his ALF with referral to hospice care.  Per EDP, patient was noted to be lethargic and weak at his assisted living facility.  EMS were called and he was noted to be hypotensive with systolic blood  pressures in the 90s to low 100s.  He reportedly had one episode of diarrhea at his facility.  Per daughter, the hospice care team was planning to see the patient today at his ALF however patient was transported to the ED prior to being seen.  Patient currently states he is feeling a little bit better.  He states that he is having one episode of diarrhea per day.  He currently denies any chest pain or dyspnea.  Most recent chest xray is showing cardiomegaly with bilateral pulmonary infiltrates/edema left-sided pleural effusion.  Slight improvement aeration in the right lung base on today's exam Type of Study: Bedside Swallow Evaluation Previous Swallow Assessment: Most recent MBS 10/19/2018  Diet Prior to this Study: Nectar-thick liquids;Dysphagia 1 (puree) Temperature Spikes Noted: Yes History of Recent Intubation: No Behavior/Cognition: Alert;Cooperative;Requires cueing Oral Cavity Assessment: Within Functional Limits Oral Care Completed by SLP: No Oral Cavity - Dentition: Adequate natural dentition Vision: Functional for self-feeding Self-Feeding Abilities: Able to feed self Patient Positioning: Upright in bed Baseline Vocal Quality: Low vocal intensity Volitional Swallow: Able to elicit    Oral/Motor/Sensory Function Overall Oral Motor/Sensory Function: Within functional limits   Ice Chips Ice chips: Not tested   Thin Liquid Thin Liquid: Impaired Presentation: Spoon Pharyngeal  Phase Impairments: Suspected delayed Swallow;Cough - Immediate    Nectar Thick Nectar Thick Liquid: Impaired Presentation: Spoon;Cup;Self Fed Pharyngeal Phase Impairments: Suspected delayed Swallow;Cough - Delayed;Cough - Immediate   Honey Thick Honey Thick Liquid: Not tested   Puree Puree: Impaired Presentation: Spoon Pharyngeal Phase Impairments: Suspected delayed Swallow;Cough - Delayed   Solid     Solid: Not tested     Shelly Flatten, MA, CCC-SLP Acute Rehab SLP [phone]  Lamar Sprinkles 10/28/2018,2:08 PM

## 2018-10-28 NOTE — Progress Notes (Addendum)
PROGRESS NOTE  BROOKLYN JEFF FYB:[id number] DOB: Oct 04, 1927 DOA: 10/26/2018 PCP: [name], MD  Brief Summary:  [phone] from ALF recently admitted with Cdiff, AKI, Aspiration PNA returning with AKI and recurrent  Asp PNA, oral Vanco for Cdiff.   HPI/Recap of past 24 hours:  He is improving, he is sitting up in bed, more alert and interactive, he is oriented x3 today  Per RN, patient does ok with solid and pills with apple sauce, his ability to handle liquid is impaired, patient coughs/chocks with oral intake, frequent oral secretion suctions helped  Patient had a formed stool today  per RN report    Assessment/Plan: Principal Problem:   Acute renal failure superimposed on stage 3 chronic kidney disease ([place]) Active Problems:   Paroxysmal atrial fibrillation (HCC)   Dysphagia   Parkinson's disease ([place])   Chronic diastolic (congestive) heart failure (HCC)   Recurrent aspiration pneumonia (HCC)   Decubitus ulcer of sacral region, stage 1   C. difficile diarrhea   Sepsis with fever, leukocytosis, hypotension, tachycardia, tachypnea , metabolic encephalopathy on presentation -source of infection including recurrent aspiration pneumonia and persistent cdiff -he is admitted to stepdown/icu unit , he is improving -currently on rocephin, flagyl and vanc enema   Recurrent aspiration pneumonia -patient has long history of dysphagia from prior CVA and progressive parkinson's  -on abx -speech eval pending -no feeding tube per patient and family  Persistent C diff -Not able tolerate oral vanc in liquid from ( only oral vanc available in the hospital) -rectal vanc enema, iv flagyl -can transition to oral vanc capsule at discharge  AKI on CKD: Cr 1.58 on 7/20 Cr 2.5 on presentation Likely due to sepsis Renal dosing meds, monitor cr is improving, today is 7.82  Chronic diastolic CHF: -EF 42-35% with G1DD by echocardiogram 06/11/2016.  - home Lasix held since admission  due to  AKI -no edema on exam, Monitor strict I/O's Daily weights  Paroxysmal atrial fibrillation: Remains in A. fib with controlled rate by exam on admission.    home Eliquis dose reduced to 2.5 twice daily due to renal dysfunction.   home Lopressor held on presentation due to hypotension, resume lopressor with holding parameters now bp is improving  GERD: -Holding home PPI in setting of C. difficile  BPH: -Continue tamsulosin  Stage I decubitus ulcer of sacral region: -Continue wound care  Parkinson's disease with dementia: -Continue Sinemet and Namenda -Delirium precaution   FTT: With recurrent aspiration pneumonia and persistent cdiff Family is ok to transition to residential hospice if he does not improve in the next 24hrs   Code Status: DNR  Family Communication: patient , daughter over the phone daily  Disposition Plan: patient and family does not want snf, residential hospice may not accept patient if life expectance is longer than 2wks, if patient continue to improve, may have to discharge back to [name] with hospice service and transition from there to residential hospice if deteriorate again to avoid rehospitalization    Consultants:  hospice  Procedures:  Rectal vanc enema   Antibiotics:  unasyn then rocephin/flagyl   Rectal vanc   Objective: BP 104/71 (BP Location: Left Arm)   Pulse 78   Temp (!) 97.4 F (36.3 C) (Axillary)   Resp 17   Ht 5\' 11"  (1.803 m)   Wt 83.5 kg   SpO2 100%   BMI 25.67 kg/m   Intake/Output Summary (Last 24 hours) at 10/28/2018 1057 Last data filed at 10/28/2018 0100 Gross  per 24 hour  Intake 300.03 ml  Output -  Net 300.03 ml   Filed Weights   10/27/18 0400 10/28/18 0600  Weight: 83.5 kg 83.5 kg    Exam: Patient is examined daily including today on 10/28/2018, exams remain the same as of yesterday except that has changed    General:  More alert and interactive , aaox3, though there is a delay in  answering questions   Cardiovascular: RRR  Respiratory: diminished, no wheezing  Abdomen: Soft/ND/NT, positive BS  Musculoskeletal: No Edema  Neuro: aaox3  Data Reviewed: Basic Metabolic Panel: Recent Labs  Lab 10/22/18 0458 10/26/18 1633 10/27/18 0155 10/28/18 0206  NA 140 139 141 143  K 3.2* 5.1 4.8 4.0  CL 112* 108 112* 110  CO2 18* 22 17* 21*  GLUCOSE 94 102* 96 94  BUN 26* 29* 31* 37*  CREATININE 1.58* 2.51* 2.26* 2.02*  CALCIUM 8.1* 8.3* 7.9* 8.1*   Liver Function Tests: Recent Labs  Lab 10/26/18 1633  AST 27  ALT 7  ALKPHOS 29*  BILITOT 0.8  PROT 6.7  ALBUMIN 2.3*   No results for input(s): LIPASE, AMYLASE in the last 168 hours. No results for input(s): AMMONIA in the last 168 hours. CBC: Recent Labs  Lab 10/22/18 0458 10/26/18 1633 10/27/18 0155 10/28/18 0206  WBC 5.4 13.3* 10.8* 12.6*  NEUTROABS  --  11.4*  --  9.6*  HGB 11.4* 13.6 12.0* 13.7  HCT 37.7* 43.7 41.3 46.6  MCV 88.3 87.6 91.8 90.8  PLT 164 203 154 166   Cardiac Enzymes:   No results for input(s): CKTOTAL, CKMB, CKMBINDEX, TROPONINI in the last 168 hours. BNP (last 3 results) Recent Labs    12/22/17 2147  BNP 443.8*    ProBNP (last 3 results) No results for input(s): PROBNP in the last 8760 hours.  CBG: No results for input(s): GLUCAP in the last 168 hours.  Recent Results (from the past 240 hour(s))  Blood Culture (routine x 2)     Status: None   Collection Time: 10/18/18  6:36 PM   Specimen: BLOOD  Result Value Ref Range Status   Specimen Description   Final    BLOOD LEFT ANTECUBITAL Performed at [facility] [address], [place], [place] [address]    Special Requests   Final    BOTTLES DRAWN AEROBIC AND ANAEROBIC Blood Culture adequate volume Performed at [facility] [address] [place], [place] [address]    Culture   Final    NO GROWTH 5 DAYS Performed at [facility], [place] [address], [place], [place]  [address]    Report Status 10/23/2018 FINAL  Final  SARS Coronavirus 2 (CEPHEID - Performed in [facility] hospital lab), Hosp Order     Status: None   Collection Time: 10/18/18  6:39 PM   Specimen: Nasopharyngeal Swab  Result Value Ref Range Status   SARS Coronavirus 2 NEGATIVE NEGATIVE Final    Comment: (NOTE) If result is NEGATIVE SARS-CoV-2 target nucleic acids are NOT DETECTED. The SARS-CoV-2 RNA is generally detectable in upper and lower  respiratory specimens during the acute phase of infection. The lowest  concentration of SARS-CoV-2 viral copies this assay can detect is 250  copies / mL. A negative result does not preclude SARS-CoV-2 infection  and should not be used as the sole basis for treatment or other  patient management decisions.  A negative result may occur with  improper specimen collection / handling,  submission of specimen other  than nasopharyngeal swab, presence of viral mutation(s) within the  areas targeted by this assay, and inadequate number of viral copies  (<250 copies / mL). A negative result must be combined with clinical  observations, patient history, and epidemiological information. If result is POSITIVE SARS-CoV-2 target nucleic acids are DETECTED. The SARS-CoV-2 RNA is generally detectable in upper and lower  respiratory specimens dur ing the acute phase of infection.  Positive  results are indicative of active infection with SARS-CoV-2.  Clinical  correlation with patient history and other diagnostic information is  necessary to determine patient infection status.  Positive results do  not rule out bacterial infection or co-infection with other viruses. If result is PRESUMPTIVE POSTIVE SARS-CoV-2 nucleic acids MAY BE PRESENT.   A presumptive positive result was obtained on the submitted specimen  and confirmed on repeat testing.  While 2019 novel coronavirus  (SARS-CoV-2) nucleic acids may be present in the submitted sample  additional confirmatory  testing may be necessary for epidemiological  and / or clinical management purposes  to differentiate between  SARS-CoV-2 and other Sarbecovirus currently known to infect humans.  If clinically indicated additional testing with an alternate test  methodology [phone]) is advised. The SARS-CoV-2 RNA is generally  detectable in upper and lower respiratory sp ecimens during the acute  phase of infection. The expected result is Negative. Fact Sheet for Patients:  StrictlyIdeas.no Fact Sheet for Healthcare Providers: BankingDealers.co.za This test is not yet approved or cleared by the [place] FDA and has been authorized for detection and/or diagnosis of SARS-CoV-2 by FDA under an Emergency Use Authorization (EUA).  This EUA will remain in effect (meaning this test can be used) for the duration of the COVID-19 declaration under Section 564(b)(1) of the Act, 21 U.S.C. section 360bbb-3(b)(1), unless the authorization is terminated or revoked sooner. Performed at [facility], [place] [address] [place], [place] [address]   Blood Culture (routine x 2)     Status: None   Collection Time: 10/18/18  6:41 PM   Specimen: BLOOD  Result Value Ref Range Status   Specimen Description   Final    BLOOD RIGHT ANTECUBITAL Performed at [facility] [address], [place], [place] [address]    Special Requests   Final    BOTTLES DRAWN AEROBIC AND ANAEROBIC Blood Culture adequate volume Performed at [place] [address], [place], [place] [address]    Culture   Final    NO GROWTH 5 DAYS Performed at [facility], [place] [address] [place], [place] [address]    Report Status 10/23/2018 FINAL  Final  Urine culture     Status: None   Collection Time: 10/18/18  7:11 PM   Specimen: In/Out Cath Urine  Result Value Ref Range Status   Specimen Description   Final    IN/OUT CATH URINE  Performed at [facility] [address], [place], [place] [address]    Special Requests   Final    NONE Performed at [facility], [place] [address], [place], [place] [address]    Culture   Final    NO GROWTH Performed at [facility], [place] [address], [place], [place] [address]    Report Status 10/20/2018 FINAL  Final  C difficile quick scan w PCR reflex     Status: Abnormal   Collection Time: 10/18/18  9:44 PM   Specimen: STOOL  Result Value Ref Range Status   C Diff antigen POSITIVE (A) NEGATIVE Final   C Diff toxin POSITIVE (A) NEGATIVE Final   C Diff interpretation Toxin producing C. difficile detected.  Final    Comment: CRITICAL RESULT CALLED TO, READ BACK BY AND VERIFIED WITH: R.Milton Ferguson [id number] @1938  BY V.WILKINS Performed at [facility] [address], [place], [place] [address]   Gastrointestinal Panel by PCR , Stool     Status: None   Collection Time: 10/18/18  9:44 PM   Specimen: STOOL  Result Value Ref Range Status   Campylobacter species NOT DETECTED NOT DETECTED Final   Plesimonas shigelloides NOT DETECTED NOT DETECTED Final   Salmonella species NOT DETECTED NOT DETECTED Final   Yersinia enterocolitica NOT DETECTED NOT DETECTED Final   Vibrio species NOT DETECTED NOT DETECTED Final   Vibrio cholerae NOT DETECTED NOT DETECTED Final   Enteroaggregative E coli (EAEC) NOT DETECTED NOT DETECTED Final   Enteropathogenic E coli (EPEC) NOT DETECTED NOT DETECTED Final   Enterotoxigenic E coli (ETEC) NOT DETECTED NOT DETECTED Final   Shiga like toxin producing E coli (STEC) NOT DETECTED NOT DETECTED Final   Shigella/Enteroinvasive E coli (EIEC) NOT DETECTED NOT DETECTED Final   Cryptosporidium NOT DETECTED NOT DETECTED Final   Cyclospora cayetanensis NOT DETECTED NOT DETECTED Final   Entamoeba histolytica NOT DETECTED NOT DETECTED Final   Giardia lamblia NOT DETECTED NOT DETECTED Final   Adenovirus F40/41  NOT DETECTED NOT DETECTED Final   Astrovirus NOT DETECTED NOT DETECTED Final   Norovirus GI/GII NOT DETECTED NOT DETECTED Final   Rotavirus A NOT DETECTED NOT DETECTED Final   Sapovirus (I, II, IV, and V) NOT DETECTED NOT DETECTED Final    Comment: Performed at [facility], [place] [place], [place] [address]  MRSA PCR Screening     Status: Abnormal   Collection Time: 10/19/18  2:30 AM   Specimen: Nasopharyngeal  Result Value Ref Range Status   MRSA by PCR POSITIVE (A) NEGATIVE Final    Comment:        The GeneXpert MRSA Assay (FDA approved for NASAL specimens only), is one component of a comprehensive MRSA colonization surveillance program. It is not intended to diagnose MRSA infection nor to guide or monitor treatment for MRSA infections. RESULT CALLED TO, READ BACK BY AND VERIFIED WITH: C.GRIFFITH RN AT 0909 ON 10/19/2018 BY S.VANHOORNE Performed at [facility], [place] [address], [place], [place] [address]   SARS Coronavirus 2 (CEPHEID - Performed in [facility] hospital lab), Hosp Order     Status: None   Collection Time: 10/23/18  2:00 PM   Specimen: Nasopharyngeal Swab  Result Value Ref Range Status   SARS Coronavirus 2 NEGATIVE NEGATIVE Final    Comment: (NOTE) If result is NEGATIVE SARS-CoV-2 target nucleic acids are NOT DETECTED. The SARS-CoV-2 RNA is generally detectable in upper and lower  respiratory specimens during the acute phase of infection. The lowest  concentration of SARS-CoV-2 viral copies this assay can detect is 250  copies / mL. A negative result does not preclude SARS-CoV-2 infection  and should not be used as the sole basis for treatment or other  patient management decisions.  A negative result may occur with  improper specimen collection / handling, submission of specimen other  than nasopharyngeal swab, presence of viral mutation(s) within the  areas targeted by this assay, and inadequate number of viral copies   (<250 copies / mL). A negative  result must be combined with clinical  observations, patient history, and epidemiological information. If result is POSITIVE SARS-CoV-2 target nucleic acids are DETECTED. The SARS-CoV-2 RNA is generally detectable in upper and lower  respiratory specimens dur ing the acute phase of infection.  Positive  results are indicative of active infection with SARS-CoV-2.  Clinical  correlation with patient history and other diagnostic information is  necessary to determine patient infection status.  Positive results do  not rule out bacterial infection or co-infection with other viruses. If result is PRESUMPTIVE POSTIVE SARS-CoV-2 nucleic acids MAY BE PRESENT.   A presumptive positive result was obtained on the submitted specimen  and confirmed on repeat testing.  While 2019 novel coronavirus  (SARS-CoV-2) nucleic acids may be present in the submitted sample  additional confirmatory testing may be necessary for epidemiological  and / or clinical management purposes  to differentiate between  SARS-CoV-2 and other Sarbecovirus currently known to infect humans.  If clinically indicated additional testing with an alternate test  methodology [phone]) is advised. The SARS-CoV-2 RNA is generally  detectable in upper and lower respiratory sp ecimens during the acute  phase of infection. The expected result is Negative. Fact Sheet for Patients:  StrictlyIdeas.no Fact Sheet for Healthcare Providers: BankingDealers.co.za This test is not yet approved or cleared by the [place] FDA and has been authorized for detection and/or diagnosis of SARS-CoV-2 by FDA under an Emergency Use Authorization (EUA).  This EUA will remain in effect (meaning this test can be used) for the duration of the COVID-19 declaration under Section 564(b)(1) of the Act, 21 U.S.C. section 360bbb-3(b)(1), unless the authorization is terminated or  revoked sooner. Performed at [facility], [place] [address] [place], [place] [address]   SARS Coronavirus 2 (CEPHEID - Performed in [place] hospital lab), Hosp Order     Status: None   Collection Time: 10/26/18  3:54 PM   Specimen: Urine, Clean Catch; Nasopharyngeal  Result Value Ref Range Status   SARS Coronavirus 2 NEGATIVE NEGATIVE Final    Comment: (NOTE) If result is NEGATIVE SARS-CoV-2 target nucleic acids are NOT DETECTED. The SARS-CoV-2 RNA is generally detectable in upper and lower  respiratory specimens during the acute phase of infection. The lowest  concentration of SARS-CoV-2 viral copies this assay can detect is 250  copies / mL. A negative result does not preclude SARS-CoV-2 infection  and should not be used as the sole basis for treatment or other  patient management decisions.  A negative result may occur with  improper specimen collection / handling, submission of specimen other  than nasopharyngeal swab, presence of viral mutation(s) within the  areas targeted by this assay, and inadequate number of viral copies  (<250 copies / mL). A negative result must be combined with clinical  observations, patient history, and epidemiological information. If result is POSITIVE SARS-CoV-2 target nucleic acids are DETECTED. The SARS-CoV-2 RNA is generally detectable in upper and lower  respiratory specimens dur ing the acute phase of infection.  Positive  results are indicative of active infection with SARS-CoV-2.  Clinical  correlation with patient history and other diagnostic information is  necessary to determine patient infection status.  Positive results do  not rule out bacterial infection or co-infection with other viruses. If result is PRESUMPTIVE POSTIVE SARS-CoV-2 nucleic acids MAY BE PRESENT.   A presumptive positive result was obtained on the submitted specimen  and confirmed on repeat testing.  While 2019 novel coronavirus  (SARS-CoV-2)  nucleic  acids may be present in the submitted sample  additional confirmatory testing may be necessary for epidemiological  and / or clinical management purposes  to differentiate between  SARS-CoV-2 and other Sarbecovirus currently known to infect humans.  If clinically indicated additional testing with an alternate test  methodology [phone]) is advised. The SARS-CoV-2 RNA is generally  detectable in upper and lower respiratory sp ecimens during the acute  phase of infection. The expected result is Negative. Fact Sheet for Patients:  StrictlyIdeas.no Fact Sheet for Healthcare Providers: BankingDealers.co.za This test is not yet approved or cleared by the [place] FDA and has been authorized for detection and/or diagnosis of SARS-CoV-2 by FDA under an Emergency Use Authorization (EUA).  This EUA will remain in effect (meaning this test can be used) for the duration of the COVID-19 declaration under Section 564(b)(1) of the Act, 21 U.S.C. section 360bbb-3(b)(1), unless the authorization is terminated or revoked sooner. Performed at [facility], [place] [address], [place], [place] [address]   Blood Culture (routine x 2)     Status: None (Preliminary result)   Collection Time: 10/26/18  4:33 PM   Specimen: BLOOD  Result Value Ref Range Status   Specimen Description   Final    BLOOD RIGHT ANTECUBITAL Performed at [facility] [address] [place], [place] [address]    Special Requests   Final    BOTTLES DRAWN AEROBIC ONLY Blood Culture results may not be optimal due to an inadequate volume of blood received in culture bottles Performed at [place] [address], [place], [place] [address]    Culture   Final    NO GROWTH 2 DAYS Performed at [facility] [address], [place], [place] [address]    Report Status PENDING  Incomplete  Urine culture     Status:  None   Collection Time: 10/26/18  4:33 PM   Specimen: In/Out Cath Urine  Result Value Ref Range Status   Specimen Description   Final    IN/OUT CATH URINE Performed at [facility] [address], [place], [place] [address]    Special Requests   Final    NONE Performed at [facility], [place] [address], [place], [place] [address]    Culture   Final    NO GROWTH Performed at [facility], [place] [address] [place], [place] [address]    Report Status 10/27/2018 FINAL  Final  MRSA PCR Screening     Status: None   Collection Time: 10/26/18  9:49 PM   Specimen: Nasal Mucosa; Nasopharyngeal  Result Value Ref Range Status   MRSA by PCR NEGATIVE NEGATIVE Final    Comment:        The GeneXpert MRSA Assay (FDA approved for NASAL specimens only), is one component of a comprehensive MRSA colonization surveillance program. It is not intended to diagnose MRSA infection nor to guide or monitor treatment for MRSA infections. Performed at [facility], [place] [address] [place], [place] [address]      Studies: No results found.  Scheduled Meds: . apixaban  2.5 mg Oral BID  . carbidopa-levodopa  1 tablet Oral TID WC  . Chlorhexidine Gluconate Cloth  6 each Topical Daily  . mouth rinse  15 mL Mouth Rinse BID  . memantine  10 mg Oral BID  . metoprolol tartrate  12.5 mg Oral BID  . nystatin  5 mL Oral QID  .  simvastatin  20 mg Oral QPM  . sodium chloride flush  3 mL Intravenous Q12H  . tamsulosin  0.4 mg Oral QHS  . vancomycin (VANCOCIN) rectal ENEMA  500 mg Rectal Q6H    Continuous Infusions: . cefTRIAXone (ROCEPHIN)  IV Stopped (10/27/18 2153)  . metronidazole Stopped (10/28/18 1624)     Time spent: 63mins,  I have personally reviewed and interpreted on  10/28/2018 daily labs, tele strips, imagings as discussed above under date review session and assessment and plans.  I reviewed all nursing notes, pharmacy notes,  consultant notes,  vitals, pertinent old records  I have discussed plan of care as described above with RN , patient and family on 10/28/2018   Florencia Reasons MD, PhD  [facility] Pager [phone]. If 7PM-7AM, please contact night-coverage at [email or web address], password [facility] 10/28/2018, 10:57 AM  LOS: 2 days

## 2018-10-28 NOTE — Progress Notes (Signed)
Pt received from ICU via bed, report taken at bedside. This nurse in agreement with assessment made by previous nurse.

## 2018-10-29 DIAGNOSIS — J690 Pneumonitis due to inhalation of food and vomit: Secondary | ICD-10-CM

## 2018-10-29 DIAGNOSIS — R1310 Dysphagia, unspecified: Secondary | ICD-10-CM

## 2018-10-29 LAB — CBC WITH DIFFERENTIAL/PLATELET
Abs Immature Granulocytes: 0.11 10*3/uL — ABNORMAL HIGH (ref 0.00–0.07)
Basophils Absolute: 0 10*3/uL (ref 0.0–0.1)
Basophils Relative: 0 %
Eosinophils Absolute: 0.4 10*3/uL (ref 0.0–0.5)
Eosinophils Relative: 4 %
HCT: 46.4 % (ref 39.0–52.0)
Hemoglobin: 14 g/dL (ref 13.0–17.0)
Immature Granulocytes: 1 %
Lymphocytes Relative: 16 %
Lymphs Abs: 1.6 10*3/uL (ref 0.7–4.0)
MCH: 27.1 pg (ref 26.0–34.0)
MCHC: 30.2 g/dL (ref 30.0–36.0)
MCV: 89.9 fL (ref 80.0–100.0)
Monocytes Absolute: 0.9 10*3/uL (ref 0.1–1.0)
Monocytes Relative: 9 %
Neutro Abs: 6.9 10*3/uL (ref 1.7–7.7)
Neutrophils Relative %: 70 %
Platelets: 193 10*3/uL (ref 150–400)
RBC: 5.16 MIL/uL (ref 4.22–5.81)
RDW: 15.5 % (ref 11.5–15.5)
WBC: 10 10*3/uL (ref 4.0–10.5)
nRBC: 0 % (ref 0.0–0.2)

## 2018-10-29 LAB — BASIC METABOLIC PANEL
Anion gap: 12 (ref 5–15)
BUN: 32 mg/dL — ABNORMAL HIGH (ref 8–23)
CO2: 21 mmol/L — ABNORMAL LOW (ref 22–32)
Calcium: 8.1 mg/dL — ABNORMAL LOW (ref 8.9–10.3)
Chloride: 108 mmol/L (ref 98–111)
Creatinine, Ser: 1.65 mg/dL — ABNORMAL HIGH (ref 0.61–1.24)
GFR calc Af Amer: 42 mL/min — ABNORMAL LOW (ref >60)
GFR calc non Af Amer: 37 mL/min — ABNORMAL LOW (ref >60)
Glucose, Bld: 92 mg/dL (ref 70–99)
Potassium: 3.9 mmol/L (ref 3.5–5.1)
Sodium: 141 mmol/L (ref 135–145)

## 2018-10-29 LAB — SARS CORONAVIRUS 2 BY RT PCR (HOSPITAL ORDER, PERFORMED IN ~~LOC~~ HOSPITAL LAB): SARS Coronavirus 2: NEGATIVE

## 2018-10-29 MED ORDER — NYSTATIN 100000 UNIT/ML MT SUSP: 5.0000 mL | Freq: Four times a day (QID) | OROMUCOSAL | 0 refills | Status: AC

## 2018-10-29 MED ORDER — AMOXICILLIN-POT CLAVULANATE 875-125 MG PO TABS
1.0000 | ORAL_TABLET | Freq: Two times a day (BID) | ORAL | Status: DC
  Administered 2018-10-29 – 2018-10-30 (×3): 1 via ORAL
  Filled 2018-10-29 (×3): qty 1

## 2018-10-29 MED ORDER — AMOXICILLIN-POT CLAVULANATE 875-125 MG PO TABS: 1.0000 | ORAL_TABLET | Freq: Two times a day (BID) | ORAL | 0 refills | Status: DC

## 2018-10-29 MED ORDER — VANCOMYCIN HCL 125 MG PO CAPS: 125.0000 mg | ORAL_CAPSULE | Freq: Four times a day (QID) | ORAL | 0 refills | Status: AC

## 2018-10-29 NOTE — Progress Notes (Signed)
[facility] ([facility])  Pt will be d/c back to [name] later today.  [facility] will contact facility and family to arrange admission visit for hospice services.  Thank you, Venia Carbon RN, BSN, [facility] Liaison (in [place]) [phone]

## 2018-10-29 NOTE — Progress Notes (Signed)
Patient is medically stable and ready for transfer back to ALF/ [name].  Have d/w CM and ordered in-house COVID test.   Kelly Splinter MD  [facility]  pgr [phone] 10/29/2018, 10:55 AM

## 2018-10-29 NOTE — Discharge Summary (Addendum)
Physician Discharge Summary  Patient ID: Jason [name] MRN: [id number] DOB/AGE: 10/25/1927 85 y.o.  Admit date: 10/26/2018 Discharge date: 10/30/2018  Admission Diagnoses:   Acute renal failure superimposed on stage 3 chronic kidney disease (HCC)   Paroxysmal atrial fibrillation (HCC)   Dysphagia   Parkinson's disease (HCC)   Chronic diastolic (congestive) heart failure (HCC)   Recurrent aspiration pneumonia (HCC)   Decubitus ulcer of sacral region, stage 1   C. difficile diarrhea   AKI (acute kidney injury) ([place])   Dementia   Discharge Diagnoses:    Acute renal failure superimposed on stage 3 chronic kidney disease (HCC)   Paroxysmal atrial fibrillation (HCC)   Dysphagia   Parkinson's disease (HCC)   Chronic diastolic (congestive) heart failure (HCC)   Recurrent aspiration pneumonia (HCC)   Decubitus ulcer of sacral region, stage 1   C. difficile diarrhea   AKI (acute kidney injury) ([place])   Discharged Condition: fair  HPI: 39M from ALF recently admitted with Cdiff, AKI, Aspiration PNA returning with AKI and recurrent  Asp PNA, oral Vanco for Cdiff.    Hospital Course  Sepsis/ recur aspiration PNA/ AMS:  -source of infection including recurrent aspiration pneumonia -he is admitted to stepdown/icu unit , he improved daily and is now Ox 3 -will switch to po Augmentin 875 bid for 2 more days to complete 1 week's course   Failure to thrive - With recurrent aspiration pneumonia and recent Cdif - most likely asp pneumonia will recur as patient has hx of refusal to follow dietary recommendations - Seen by hospice here, they note that the daughter/ family still want to proceed w/ hospice after discharge.  - we are going to discharge back to [name] today with hospice service there - pt is DNR  Recent C diff - having formed stools here, got Rx here w/ vanc enema and IV flagyl - resume oral vanc capsule at discharge 125 qid x 10d post abx completion= 12d  AKI on  CKD: Cr 1.58 on 7/20 Cr 2.5 on presentation Likely due to sepsis Renal dosing meds, cr improved  Chronic diastolic CHF: -EF 29-52% with G1DD by echocardiogram 06/11/2016. - home Lasix held since admission due to  AKI > resume at dc   Paroxysmal atrial fibrillation: Remains in A. fib with controlled rate by exam on admission.  - resume eliquis at dc - home Lopressor held on presentation due to hypotension, resume lopressor at dc  GERD: -Holding home PPI in setting of C. difficile  BPH: -Continue tamsulosin  Stage I decubitus ulcer of sacral region: -Continue wound care  Parkinson's disease with dementia: -Continue Sinemet and Namenda -Delirium precaution   Discharge Exam: Blood pressure 128/80, pulse 96, temperature 97.9 F (36.6 C), temperature source Oral, resp. rate 16, height 5\' 11"  (1.803 m), weight 83.5 kg, SpO2 98 %.  General:  alert and interactive, gen'd weakness, aaox3  Cardiovascular: RRR  Respiratory: diminished, no wheezing  Abdomen: Soft/ND/NT, positive BS  Musculoskeletal: No Edema  Neuro: aaox3  Disposition: Discharge disposition: 01-Home or Self Care        Allergies as of 10/30/2018   No Known Allergies     Medication List    STOP taking these medications   esomeprazole 40 MG capsule Commonly known as: NEXIUM   vancomycin 50 mg/mL  oral solution Commonly known as: VANCOCIN     TAKE these medications   amoxicillin-clavulanate 875-125 MG tablet Commonly known as: AUGMENTIN Take 1 tablet by mouth every 12 (twelve) hours  for 2 days. What changed: when to take this   calcium carbonate 500 MG chewable tablet Commonly known as: TUMS - dosed in mg elemental calcium Chew 2 tablets by mouth every 8 (eight) hours.   carbidopa-levodopa 25-100 MG tablet Commonly known as: SINEMET IR Take 1 tablet by mouth 3 (three) times daily. Take at 8am, 12noon, 5pm   Eliquis 5 MG Tabs tablet Generic drug: apixaban Take 5 mg by mouth 2  (two) times daily.   EQL Vitamin D3 25 MCG (1000 UT) tablet Generic drug: Cholecalciferol Take 2,000 Units by mouth daily. Take 2 tabs (2000 units total) once a day   ferrous sulfate 325 (65 FE) MG tablet Take 325 mg by mouth daily with breakfast.   fluticasone 50 MCG/ACT nasal spray Commonly known as: FLONASE Place 2 sprays into both nostrils daily.   furosemide 20 MG tablet Commonly known as: LASIX Take 1 tablet (20 mg total) by mouth daily.   ICAPS LUTEIN-ZEAXANTHIN PO Take 1 tablet by mouth 2 (two) times daily.   Melatonin 3 MG Tabs Take 3 mg by mouth at bedtime.   memantine 10 MG tablet Commonly known as: Namenda Take 1 tablet (10 mg total) by mouth 2 (two) times daily.   metoprolol tartrate 25 MG tablet Commonly known as: LOPRESSOR Take 0.5 tablets (12.5 mg total) by mouth 2 (two) times daily.   nystatin 100000 UNIT/ML suspension Commonly known as: MYCOSTATIN Take 5 mLs (500,000 Units total) by mouth 4 (four) times daily for 4 days.   potassium chloride SA 20 MEQ tablet Commonly known as: K-DUR Take 2 tablets (40 mEq total) by mouth daily.   simvastatin 20 MG tablet Commonly known as: ZOCOR Take 20 mg by mouth every evening.   tamsulosin 0.4 MG Caps capsule Commonly known as: FLOMAX Take 0.4 mg by mouth at bedtime.   vancomycin 125 MG capsule Commonly known as: Vancocin HCl Take 1 capsule (125 mg total) by mouth 4 (four) times daily for 12 days.        Signed: Sandy Salaam Amahd Morino 10/30/2018, 8:43 AM

## 2018-10-29 NOTE — NC FL2 (Addendum)
[place] LEVEL OF CARE SCREENING TOOL     IDENTIFICATION  Patient Name: Jason [name] Birthdate: [birth year] Sex: male Admission Date (Current Location): 10/26/2018  [facility] and [place] Number:  Herbalist and Address:  [facility],  [place], [place]      Provider Number: [id number]  Attending Physician Name and Address:  Roney Jaffe, MD  Relative Name and Phone Number:  Peggye Form    Current Level of Care: Hospital(assisted living) Recommended Level of Care: [place] Prior Approval Number:    Date Approved/Denied:   PASRR Number: [id number]  Discharge Plan: Other (Comment)(assisted living)    Current Diagnoses: Patient Active Problem List   Diagnosis Date Noted  . AKI (acute kidney injury) ([place])   . C. difficile diarrhea 10/26/2018  . Dehydration 10/18/2018  . SIRS (systemic inflammatory response syndrome) ([place]) 10/18/2018  . Decubitus ulcer of sacral region, stage 1 10/18/2018  . Memory loss 05/14/2018  . Parkinsonism ([place]) 05/14/2018  . History of stroke 05/04/2018  . Dilated aortic root ([place]) 05/04/2018  . Palliative care by specialist   . Goals of care, counseling/discussion   . HCAP (healthcare-associated pneumonia) 12/22/2017  . Sepsis ([place]) 12/22/2017  . Chronic diastolic (congestive) heart failure ([place]) 12/22/2017  . Recurrent aspiration pneumonia ([place]) 12/22/2017  . Diarrhea 12/22/2017  . Fall at home 10/19/2016  . Gait abnormality 09/05/2016  . Parkinson's disease ([place]) 09/05/2016  . CVA (cerebral vascular accident) ([place]) 07/01/2016  . Dysarthria   . History of cancer   . TIA (transient ischemic attack) 06/30/2016  . Hemiplegia affecting dominant side ([place]) 06/30/2016  . History of bladder cancer   . Dysphagia   . Hyperglycemia   . Acute renal failure superimposed on stage 3 chronic kidney disease ([place])   . Acute blood loss anemia   . Leukocytosis   . Weakness   .  Community acquired pneumonia 06/16/2016  . Unresponsiveness   . CAP (community acquired pneumonia) 06/15/2016  . Paroxysmal atrial fibrillation ([place]) 06/15/2016  . Pressure injury of skin 06/15/2016  . Lobar pneumonia ([place])   . Acute congestive heart failure ([place])   . Primary osteoarthritis of left knee 07/30/2015  . BPH (benign prostatic hyperplasia) 07/16/2012  . Multiple nodules of lung 09/16/2011  . GERD (gastroesophageal reflux disease) 06/14/2011  . Constipation, chronic 06/14/2011  . NHL (non-Hodgkin's lymphoma) ([place]) 01/03/2010  . Malignant neoplasm of bladder ([place]) 05/28/2007  . Hyperlipidemia 05/28/2007  . HIATAL HERNIA 05/28/2007  . BARRETT'S ESOPHAGUS, HX OF 05/28/2007    Orientation RESPIRATION BLADDER Height & Weight     Self, Time, Situation, Place  Normal Continent Weight: 83.5 kg Height:  5\' 11"  (180.3 cm)  BEHAVIORAL SYMPTOMS/MOOD NEUROLOGICAL BOWEL NUTRITION STATUS      Continent regular  AMBULATORY STATUS COMMUNICATION OF NEEDS Skin   Limited Assist Verbally Normal                       Personal Care Assistance Level of Assistance  Bathing, Feeding Bathing Assistance: Limited assistance Feeding assistance: Limited assistance       Functional Limitations Info  Hearing   Hearing Info: Impaired      SPECIAL CARE FACTORS FREQUENCY                       Contractures Contractures Info: Not present    Additional Factors Info  Code Status Code Status Info: DNR  Current Medications (10/29/2018):  This is the current hospital active medication list Current Facility-Administered Medications  Medication Dose Route Frequency Provider Last Rate Last Dose  . acetaminophen (TYLENOL) tablet 650 mg  650 mg Oral Q6H PRN Lenore Cordia, MD       Or  . acetaminophen (TYLENOL) suppository 650 mg  650 mg Rectal Q6H PRN Lenore Cordia, MD      . apixaban Arne Cleveland) tablet 2.5 mg  2.5 mg Oral BID Zada Finders R, MD   2.5 mg at  10/28/18 2110  . carbidopa-levodopa (SINEMET IR) 25-100 MG per tablet immediate release 1 tablet  1 tablet Oral TID WC Lenore Cordia, MD   1 tablet at 10/29/18 0804  . cefTRIAXone (ROCEPHIN) 1 g in sodium chloride 0.9 % 100 mL IVPB  1 g Intravenous Q24H Polly Cobia, RPH 200 mL/hr at 10/28/18 2103 1 g at 10/28/18 2103  . Chlorhexidine Gluconate Cloth 2 % PADS 6 each  6 each Topical Daily Lenore Cordia, MD   6 each at 10/27/18 2133  . MEDLINE mouth rinse  15 mL Mouth Rinse BID Lenore Cordia, MD   15 mL at 10/28/18 2110  . memantine (NAMENDA) tablet 10 mg  10 mg Oral BID Lenore Cordia, MD   10 mg at 10/28/18 2110  . metoprolol tartrate (LOPRESSOR) tablet 12.5 mg  12.5 mg Oral BID Florencia Reasons, MD   12.5 mg at 10/28/18 2240  . metroNIDAZOLE (FLAGYL) IVPB 500 mg  500 mg Intravenous Blair Promise, MD 100 mL/hr at 10/29/18 0536 500 mg at 10/29/18 0536  . nystatin (MYCOSTATIN) 100000 UNIT/ML suspension 500,000 Units  5 mL Oral QID Florencia Reasons, MD   500,000 Units at 10/28/18 2109  . ondansetron (ZOFRAN) tablet 4 mg  4 mg Oral Q6H PRN Lenore Cordia, MD   4 mg at 10/28/18 2110   Or  . ondansetron (ZOFRAN) injection 4 mg  4 mg Intravenous Q6H PRN Lenore Cordia, MD      . Resource ThickenUp Clear   Oral PRN Florencia Reasons, MD      . simvastatin (ZOCOR) tablet 20 mg  20 mg Oral QPM Lenore Cordia, MD   20 mg at 10/28/18 1839  . sodium chloride flush (NS) 0.9 % injection 3 mL  3 mL Intravenous Q12H Zada Finders R, MD   3 mL at 10/28/18 2110  . tamsulosin (FLOMAX) capsule 0.4 mg  0.4 mg Oral QHS Zada Finders R, MD   0.4 mg at 10/28/18 2110  . vancomycin (VANCOCIN) 500 mg in sodium chloride irrigation 0.9 % 100 mL ENEMA  500 mg Rectal Q6H Dorrene German, RPH   500 mg at 10/29/18 4765     Discharge Medications: Please see discharge summary for a list of discharge medications.  Relevant Imaging Results:  Relevant Lab Results:   Additional Information [id number]  Leeroy Cha,  RN

## 2018-10-29 NOTE — Care Management Important Message (Signed)
Important Message  Patient Details IM Letter given to Velva Harman RN to present to the Patient Name: Jason [name] MRN: [id number] Date of Birth: Sep 22, 1927   Medicare Important Message Given:  Yes     Kerin Salen 10/29/2018, 10:03 AM

## 2018-10-29 NOTE — TOC Progression Note (Addendum)
Transition of Care [facility]) - Progression Note    Patient Details  Name: Jason [name] MRN: [id number] Date of Birth: [birth year]  Transition of Care [facility]) CM/SW Contact  Leeroy Cha, RN Phone Number: 10/29/2018, 3:11 PM  Clinical Narrative:    Awaiting approval from [name] for patient to retrun.S/W bookie at the facility will look at [place] that was submitted this am. And call back. Dc summary and updated fl2 faxed to facility. [facility]=bookie/message left to return call and that patient has been dcd.  Call made at 1640.      Expected Discharge Plan and Services                                                 Social Determinants of Health (SDOH) Interventions    Readmission Risk Interventions No flowsheet data found.

## 2018-10-30 MED ORDER — AMOXICILLIN-POT CLAVULANATE 875-125 MG PO TABS: 1.0000 | ORAL_TABLET | Freq: Two times a day (BID) | ORAL | 0 refills | Status: AC

## 2018-10-30 NOTE — TOC Progression Note (Signed)
Transition of Care [facility]) - Progression Note    Patient Details  Name: Jason [name] MRN: [id number] Date of Birth: 10/27/1927  Transition of Care [facility]) CM/SW Contact  Leeroy Cha, RN Phone Number: 10/30/2018, 9:40 AM  Clinical Narrative:     Requested updated dc summary faxed to [facility] for [place]  Expected Discharge Plan: Assisted Living Barriers to Discharge: Continued Medical Work up  Expected Discharge Plan and Services Expected Discharge Plan: Assisted Living         Expected Discharge Date: 10/29/18                                     Social Determinants of Health (SDOH) Interventions    Readmission Risk Interventions No flowsheet data found.

## 2018-10-30 NOTE — Progress Notes (Addendum)
Patient not dc'd yesterday, he was stable overnight and w/o complaints this am. Will be dc'd back to ALF today.  D/C summary updated.   Kelly Splinter MD  [facility]  pgr [phone] 10/30/2018, 8:45 AM

## 2018-10-30 NOTE — Plan of Care (Signed)
[facility] picked up patient and transported back to facility. Discharge package was given to PTAR for caregivers at facility.

## 2018-10-30 NOTE — TOC Transition Note (Signed)
Transition of Care [facility]) - CM/SW Discharge Note   Patient Details  Name: Jason [name] MRN: [id number] Date of Birth: 10/04/1927  Transition of Care [facility]) CM/SW Contact:  Leeroy Cha, RN Phone Number: 10/30/2018, 10:22 AM   Clinical Narrative:    TCT ptar for transport back to [facility]   Final next level of care: Assisted Living Barriers to Discharge: Continued Medical Work up   Patient Goals and CMS Choice        Discharge Placement                       Discharge Plan and Services                                     Social Determinants of Health (SDOH) Interventions     Readmission Risk Interventions No flowsheet data found.

## 2018-10-31 LAB — CULTURE, BLOOD (ROUTINE X 2): Culture: NO GROWTH

## 2018-11-21 ENCOUNTER — Emergency Department (HOSPITAL_COMMUNITY)

## 2018-11-21 ENCOUNTER — Other Ambulatory Visit: Payer: Self-pay

## 2018-11-21 ENCOUNTER — Encounter (HOSPITAL_COMMUNITY): Payer: Self-pay

## 2018-11-21 ENCOUNTER — Inpatient Hospital Stay (HOSPITAL_COMMUNITY)
Admission: EM | Admit: 2018-11-21 | Discharge: 2018-12-04 | DRG: 871 | Disposition: E | Source: Skilled Nursing Facility | Attending: Family Medicine | Admitting: Family Medicine

## 2018-11-21 DIAGNOSIS — N183 Chronic kidney disease, stage 3 (moderate): Secondary | ICD-10-CM | POA: Diagnosis present

## 2018-11-21 DIAGNOSIS — N179 Acute kidney failure, unspecified: Secondary | ICD-10-CM | POA: Diagnosis present

## 2018-11-21 DIAGNOSIS — R509 Fever, unspecified: Secondary | ICD-10-CM | POA: Diagnosis not present

## 2018-11-21 DIAGNOSIS — L8990 Pressure ulcer of unspecified site, unspecified stage: Secondary | ICD-10-CM | POA: Diagnosis present

## 2018-11-21 DIAGNOSIS — R6510 Systemic inflammatory response syndrome (SIRS) of non-infectious origin without acute organ dysfunction: Secondary | ICD-10-CM

## 2018-11-21 DIAGNOSIS — J189 Pneumonia, unspecified organism: Secondary | ICD-10-CM | POA: Diagnosis present

## 2018-11-21 DIAGNOSIS — R404 Transient alteration of awareness: Secondary | ICD-10-CM | POA: Diagnosis not present

## 2018-11-21 DIAGNOSIS — E872 Acidosis: Secondary | ICD-10-CM | POA: Diagnosis present

## 2018-11-21 DIAGNOSIS — G9341 Metabolic encephalopathy: Secondary | ICD-10-CM | POA: Diagnosis present

## 2018-11-21 DIAGNOSIS — R197 Diarrhea, unspecified: Secondary | ICD-10-CM | POA: Diagnosis not present

## 2018-11-21 DIAGNOSIS — Z85828 Personal history of other malignant neoplasm of skin: Secondary | ICD-10-CM

## 2018-11-21 DIAGNOSIS — Z20828 Contact with and (suspected) exposure to other viral communicable diseases: Secondary | ICD-10-CM | POA: Diagnosis present

## 2018-11-21 DIAGNOSIS — Z96652 Presence of left artificial knee joint: Secondary | ICD-10-CM | POA: Diagnosis present

## 2018-11-21 DIAGNOSIS — I69351 Hemiplegia and hemiparesis following cerebral infarction affecting right dominant side: Secondary | ICD-10-CM

## 2018-11-21 DIAGNOSIS — A0471 Enterocolitis due to Clostridium difficile, recurrent: Secondary | ICD-10-CM | POA: Diagnosis present

## 2018-11-21 DIAGNOSIS — Z8572 Personal history of non-Hodgkin lymphomas: Secondary | ICD-10-CM

## 2018-11-21 DIAGNOSIS — A4189 Other specified sepsis: Secondary | ICD-10-CM | POA: Diagnosis not present

## 2018-11-21 DIAGNOSIS — Z87891 Personal history of nicotine dependence: Secondary | ICD-10-CM

## 2018-11-21 DIAGNOSIS — A498 Other bacterial infections of unspecified site: Secondary | ICD-10-CM

## 2018-11-21 DIAGNOSIS — Z8711 Personal history of peptic ulcer disease: Secondary | ICD-10-CM

## 2018-11-21 DIAGNOSIS — Z515 Encounter for palliative care: Secondary | ICD-10-CM

## 2018-11-21 DIAGNOSIS — K219 Gastro-esophageal reflux disease without esophagitis: Secondary | ICD-10-CM | POA: Diagnosis present

## 2018-11-21 DIAGNOSIS — R0682 Tachypnea, not elsewhere classified: Secondary | ICD-10-CM | POA: Diagnosis not present

## 2018-11-21 DIAGNOSIS — Z79899 Other long term (current) drug therapy: Secondary | ICD-10-CM

## 2018-11-21 DIAGNOSIS — A0472 Enterocolitis due to Clostridium difficile, not specified as recurrent: Secondary | ICD-10-CM | POA: Diagnosis present

## 2018-11-21 DIAGNOSIS — R6521 Severe sepsis with septic shock: Secondary | ICD-10-CM | POA: Diagnosis present

## 2018-11-21 DIAGNOSIS — K2270 Barrett's esophagus without dysplasia: Secondary | ICD-10-CM | POA: Diagnosis present

## 2018-11-21 DIAGNOSIS — G20 Parkinson's disease: Secondary | ICD-10-CM | POA: Diagnosis present

## 2018-11-21 DIAGNOSIS — Y95 Nosocomial condition: Secondary | ICD-10-CM | POA: Diagnosis present

## 2018-11-21 DIAGNOSIS — L89102 Pressure ulcer of unspecified part of back, stage 2: Secondary | ICD-10-CM | POA: Diagnosis present

## 2018-11-21 DIAGNOSIS — I480 Paroxysmal atrial fibrillation: Secondary | ICD-10-CM

## 2018-11-21 DIAGNOSIS — Z8249 Family history of ischemic heart disease and other diseases of the circulatory system: Secondary | ICD-10-CM

## 2018-11-21 DIAGNOSIS — Z66 Do not resuscitate: Secondary | ICD-10-CM

## 2018-11-21 DIAGNOSIS — Z8551 Personal history of malignant neoplasm of bladder: Secondary | ICD-10-CM

## 2018-11-21 DIAGNOSIS — I5032 Chronic diastolic (congestive) heart failure: Secondary | ICD-10-CM | POA: Diagnosis present

## 2018-11-21 DIAGNOSIS — H3530 Unspecified macular degeneration: Secondary | ICD-10-CM | POA: Diagnosis present

## 2018-11-21 DIAGNOSIS — Z86010 Personal history of colonic polyps: Secondary | ICD-10-CM

## 2018-11-21 DIAGNOSIS — Z9049 Acquired absence of other specified parts of digestive tract: Secondary | ICD-10-CM

## 2018-11-21 DIAGNOSIS — Z85028 Personal history of other malignant neoplasm of stomach: Secondary | ICD-10-CM

## 2018-11-21 DIAGNOSIS — Z79890 Hormone replacement therapy (postmenopausal): Secondary | ICD-10-CM

## 2018-11-21 DIAGNOSIS — R571 Hypovolemic shock: Secondary | ICD-10-CM | POA: Diagnosis present

## 2018-11-21 DIAGNOSIS — E785 Hyperlipidemia, unspecified: Secondary | ICD-10-CM | POA: Diagnosis present

## 2018-11-21 LAB — COMPREHENSIVE METABOLIC PANEL
ALT: 7 U/L (ref 0–44)
AST: 27 U/L (ref 15–41)
Albumin: 1.9 g/dL — ABNORMAL LOW (ref 3.5–5.0)
Alkaline Phosphatase: 47 U/L (ref 38–126)
Anion gap: 14 (ref 5–15)
BUN: 44 mg/dL — ABNORMAL HIGH (ref 8–23)
CO2: 18 mmol/L — ABNORMAL LOW (ref 22–32)
Calcium: 8.4 mg/dL — ABNORMAL LOW (ref 8.9–10.3)
Chloride: 109 mmol/L (ref 98–111)
Creatinine, Ser: 2.91 mg/dL — ABNORMAL HIGH (ref 0.61–1.24)
GFR calc Af Amer: 21 mL/min — ABNORMAL LOW (ref >60)
GFR calc non Af Amer: 18 mL/min — ABNORMAL LOW (ref >60)
Glucose, Bld: 105 mg/dL — ABNORMAL HIGH (ref 70–99)
Potassium: 3.6 mmol/L (ref 3.5–5.1)
Sodium: 141 mmol/L (ref 135–145)
Total Bilirubin: 0.5 mg/dL (ref 0.3–1.2)
Total Protein: 5.8 g/dL — ABNORMAL LOW (ref 6.5–8.1)

## 2018-11-21 NOTE — ED Triage Notes (Signed)
Pt BIB EMS from [place]. Pt has diarrhea, generalized weakness, and fever. Pt has had C diff 2x in the last 2 months. Hospice nurse wanted him to be sent here for c diff and covid testing. They stopped all of his medications x2 weeks ago.

## 2018-11-22 ENCOUNTER — Emergency Department (HOSPITAL_COMMUNITY)

## 2018-11-22 DIAGNOSIS — Z8551 Personal history of malignant neoplasm of bladder: Secondary | ICD-10-CM | POA: Diagnosis not present

## 2018-11-22 DIAGNOSIS — J690 Pneumonitis due to inhalation of food and vomit: Secondary | ICD-10-CM | POA: Diagnosis not present

## 2018-11-22 DIAGNOSIS — A0471 Enterocolitis due to Clostridium difficile, recurrent: Secondary | ICD-10-CM | POA: Diagnosis present

## 2018-11-22 DIAGNOSIS — K219 Gastro-esophageal reflux disease without esophagitis: Secondary | ICD-10-CM | POA: Diagnosis present

## 2018-11-22 DIAGNOSIS — Z20828 Contact with and (suspected) exposure to other viral communicable diseases: Secondary | ICD-10-CM | POA: Diagnosis present

## 2018-11-22 DIAGNOSIS — R1310 Dysphagia, unspecified: Secondary | ICD-10-CM | POA: Diagnosis not present

## 2018-11-22 DIAGNOSIS — Z8572 Personal history of non-Hodgkin lymphomas: Secondary | ICD-10-CM | POA: Diagnosis not present

## 2018-11-22 DIAGNOSIS — R6510 Systemic inflammatory response syndrome (SIRS) of non-infectious origin without acute organ dysfunction: Secondary | ICD-10-CM | POA: Diagnosis not present

## 2018-11-22 DIAGNOSIS — Z8711 Personal history of peptic ulcer disease: Secondary | ICD-10-CM | POA: Diagnosis not present

## 2018-11-22 DIAGNOSIS — Z515 Encounter for palliative care: Secondary | ICD-10-CM | POA: Diagnosis not present

## 2018-11-22 DIAGNOSIS — R571 Hypovolemic shock: Secondary | ICD-10-CM | POA: Diagnosis present

## 2018-11-22 DIAGNOSIS — E872 Acidosis: Secondary | ICD-10-CM | POA: Diagnosis present

## 2018-11-22 DIAGNOSIS — A0472 Enterocolitis due to Clostridium difficile, not specified as recurrent: Secondary | ICD-10-CM | POA: Diagnosis present

## 2018-11-22 DIAGNOSIS — Z85828 Personal history of other malignant neoplasm of skin: Secondary | ICD-10-CM | POA: Diagnosis not present

## 2018-11-22 DIAGNOSIS — H3530 Unspecified macular degeneration: Secondary | ICD-10-CM | POA: Diagnosis present

## 2018-11-22 DIAGNOSIS — Z86010 Personal history of colonic polyps: Secondary | ICD-10-CM | POA: Diagnosis not present

## 2018-11-22 DIAGNOSIS — J189 Pneumonia, unspecified organism: Secondary | ICD-10-CM | POA: Diagnosis present

## 2018-11-22 DIAGNOSIS — G9341 Metabolic encephalopathy: Secondary | ICD-10-CM | POA: Diagnosis present

## 2018-11-22 DIAGNOSIS — Z85028 Personal history of other malignant neoplasm of stomach: Secondary | ICD-10-CM | POA: Diagnosis not present

## 2018-11-22 DIAGNOSIS — Z66 Do not resuscitate: Secondary | ICD-10-CM | POA: Diagnosis not present

## 2018-11-22 DIAGNOSIS — N179 Acute kidney failure, unspecified: Secondary | ICD-10-CM | POA: Diagnosis not present

## 2018-11-22 DIAGNOSIS — N183 Chronic kidney disease, stage 3 (moderate): Secondary | ICD-10-CM | POA: Diagnosis present

## 2018-11-22 DIAGNOSIS — I5032 Chronic diastolic (congestive) heart failure: Secondary | ICD-10-CM | POA: Diagnosis present

## 2018-11-22 DIAGNOSIS — R509 Fever, unspecified: Secondary | ICD-10-CM | POA: Diagnosis not present

## 2018-11-22 DIAGNOSIS — Y95 Nosocomial condition: Secondary | ICD-10-CM | POA: Diagnosis present

## 2018-11-22 DIAGNOSIS — I69351 Hemiplegia and hemiparesis following cerebral infarction affecting right dominant side: Secondary | ICD-10-CM | POA: Diagnosis not present

## 2018-11-22 DIAGNOSIS — E785 Hyperlipidemia, unspecified: Secondary | ICD-10-CM | POA: Diagnosis present

## 2018-11-22 DIAGNOSIS — F0280 Dementia in other diseases classified elsewhere without behavioral disturbance: Secondary | ICD-10-CM | POA: Diagnosis not present

## 2018-11-22 DIAGNOSIS — A4189 Other specified sepsis: Secondary | ICD-10-CM | POA: Diagnosis present

## 2018-11-22 DIAGNOSIS — G20 Parkinson's disease: Secondary | ICD-10-CM | POA: Diagnosis not present

## 2018-11-22 DIAGNOSIS — R6521 Severe sepsis with septic shock: Secondary | ICD-10-CM | POA: Diagnosis present

## 2018-11-22 DIAGNOSIS — I480 Paroxysmal atrial fibrillation: Secondary | ICD-10-CM | POA: Diagnosis not present

## 2018-11-22 LAB — CBC WITH DIFFERENTIAL/PLATELET
Abs Immature Granulocytes: 0.08 10*3/uL — ABNORMAL HIGH (ref 0.00–0.07)
Basophils Absolute: 0 10*3/uL (ref 0.0–0.1)
Basophils Relative: 0 %
Eosinophils Absolute: 0 10*3/uL (ref 0.0–0.5)
Eosinophils Relative: 0 %
HCT: 47.6 % (ref 39.0–52.0)
Hemoglobin: 14.8 g/dL (ref 13.0–17.0)
Immature Granulocytes: 1 %
Lymphocytes Relative: 7 %
Lymphs Abs: 0.6 10*3/uL — ABNORMAL LOW (ref 0.7–4.0)
MCH: 27.9 pg (ref 26.0–34.0)
MCHC: 31.1 g/dL (ref 30.0–36.0)
MCV: 89.6 fL (ref 80.0–100.0)
Monocytes Absolute: 1.1 10*3/uL — ABNORMAL HIGH (ref 0.1–1.0)
Monocytes Relative: 13 %
Neutro Abs: 6.8 10*3/uL (ref 1.7–7.7)
Neutrophils Relative %: 79 %
Platelets: 196 10*3/uL (ref 150–400)
RBC: 5.31 MIL/uL (ref 4.22–5.81)
RDW: 17.3 % — ABNORMAL HIGH (ref 11.5–15.5)
WBC Morphology: INCREASED
WBC: 8.5 10*3/uL (ref 4.0–10.5)
nRBC: 0 % (ref 0.0–0.2)

## 2018-11-22 LAB — LACTIC ACID, PLASMA
Lactic Acid, Venous: 2.2 mmol/L (ref 0.5–1.9)
Lactic Acid, Venous: 3.6 mmol/L (ref 0.5–1.9)
Lactic Acid, Venous: 4.7 mmol/L (ref 0.5–1.9)

## 2018-11-22 LAB — C DIFFICILE QUICK SCREEN W PCR REFLEX
C Diff antigen: POSITIVE — AB
C Diff interpretation: DETECTED
C Diff toxin: POSITIVE — AB

## 2018-11-22 LAB — SARS CORONAVIRUS 2 BY RT PCR (HOSPITAL ORDER, PERFORMED IN ~~LOC~~ HOSPITAL LAB): SARS Coronavirus 2: NEGATIVE

## 2018-11-22 MED ORDER — HEPARIN SODIUM (PORCINE) 5000 UNIT/ML IJ SOLN: 5000.0000 [IU] | Freq: Three times a day (TID) | INTRAMUSCULAR | Status: DC

## 2018-11-22 MED ORDER — SODIUM CHLORIDE 0.9% FLUSH: 3.0000 mL | INTRAVENOUS | Status: DC | PRN

## 2018-11-22 MED ORDER — SODIUM CHLORIDE 0.9% FLUSH
3.0000 mL | Freq: Two times a day (BID) | INTRAVENOUS | Status: DC
  Administered 2018-11-23 – 2018-11-26 (×2): 3 mL via INTRAVENOUS

## 2018-11-22 MED ORDER — ALBUMIN HUMAN 25 % IV SOLN
50.0000 g | Freq: Four times a day (QID) | INTRAVENOUS | Status: DC
  Administered 2018-11-22: 50 g via INTRAVENOUS
  Filled 2018-11-22 (×3): qty 200

## 2018-11-22 MED ORDER — VANCOMYCIN 50 MG/ML ORAL SOLUTION
500.0000 mg | Freq: Four times a day (QID) | ORAL | Status: DC
  Filled 2018-11-22 (×3): qty 10

## 2018-11-22 MED ORDER — SODIUM CHLORIDE 0.9 % IV SOLN: 250.0000 mL | INTRAVENOUS | Status: DC | PRN

## 2018-11-22 MED ORDER — SODIUM CHLORIDE 0.9 % IV SOLN
INTRAVENOUS | Status: DC
  Administered 2018-11-22: 12:00:00 via INTRAVENOUS

## 2018-11-22 MED ORDER — SCOPOLAMINE 1 MG/3DAYS TD PT72
1.0000 | MEDICATED_PATCH | TRANSDERMAL | Status: DC
  Filled 2018-11-22 (×2): qty 1

## 2018-11-22 MED ORDER — LORAZEPAM 2 MG/ML IJ SOLN: 1.0000 mg | INTRAMUSCULAR | Status: DC | PRN

## 2018-11-22 MED ORDER — SODIUM CHLORIDE 0.9 % IV BOLUS
1000.0000 mL | Freq: Once | INTRAVENOUS | Status: AC
  Administered 2018-11-22: 1000 mL via INTRAVENOUS

## 2018-11-22 MED ORDER — ALBUMIN HUMAN 25 % IV SOLN
50.0000 g | INTRAVENOUS | Status: DC
  Administered 2018-11-22: 50 g via INTRAVENOUS
  Filled 2018-11-22 (×4): qty 200

## 2018-11-22 MED ORDER — ALBUMIN HUMAN 25 % IV SOLN
50.0000 g | INTRAVENOUS | Status: DC
  Administered 2018-11-22: 50 g via INTRAVENOUS
  Filled 2018-11-22: qty 50
  Filled 2018-11-22 (×3): qty 200

## 2018-11-22 MED ORDER — SODIUM CHLORIDE 0.9 % IV SOLN
1.0000 g | Freq: Once | INTRAVENOUS | Status: AC
  Administered 2018-11-22: 1 g via INTRAVENOUS
  Filled 2018-11-22: qty 10

## 2018-11-22 MED ORDER — METRONIDAZOLE IN NACL 5-0.79 MG/ML-% IV SOLN
500.0000 mg | Freq: Three times a day (TID) | INTRAVENOUS | Status: DC
  Administered 2018-11-22: 500 mg via INTRAVENOUS
  Filled 2018-11-22: qty 100

## 2018-11-22 MED ORDER — SODIUM CHLORIDE 0.9 % IV SOLN
500.0000 mg | Freq: Once | INTRAVENOUS | Status: AC
  Administered 2018-11-22: 500 mg via INTRAVENOUS
  Filled 2018-11-22: qty 500

## 2018-11-22 MED ORDER — HYDROMORPHONE HCL 1 MG/ML IJ SOLN
0.5000 mg | INTRAMUSCULAR | Status: DC | PRN
  Administered 2018-11-23 – 2018-11-26 (×11): 0.5 mg via INTRAVENOUS
  Filled 2018-11-22 (×2): qty 0.5
  Filled 2018-11-22: qty 1
  Filled 2018-11-22: qty 0.5
  Filled 2018-11-22: qty 1
  Filled 2018-11-22: qty 0.5
  Filled 2018-11-22 (×2): qty 1
  Filled 2018-11-22 (×2): qty 0.5
  Filled 2018-11-22 (×2): qty 1

## 2018-11-22 NOTE — Progress Notes (Signed)
Palliative Medicine RN Note: Consult order noted. Also note that pt is active with [facility] I have placed a call to their liaison to confirm that they are aware of his admission to [facility].  I spoke with Liaison Doreatha Lew. They were not aware of overnight admission. They will continue [place] discussions, as he is an active hospice patient, and will call our team if we are needed for further help.   At this time, PMT will not see Jason [name].  Marjie Skiff Julieta Rogalski, RN, BSN, [facility] Palliative Medicine Team 11/22/2018 11:10 AM Office [phone]

## 2018-11-22 NOTE — Progress Notes (Signed)
Palliative Medicine RN Note: Rec'd a call from Dr Nevada Crane to let us know of pt's declining status and to ask for help talking to family. I called Bevely Palmer with [facility] she has already called his daughter, who didn't answer. Bevely Palmer left her a message and is waiting on a call back from her.  Marjie Skiff Eriyanna Kofoed, RN, BSN, [facility] Palliative Medicine Team 11/22/2018 1:46 PM Office [phone]

## 2018-11-22 NOTE — Progress Notes (Signed)
Patients family discussed wishes for patient to be comfortable. Information regarding patient current course of treatment versus comfort care provided to family. Family requesting to make patient comfort care with wishes to send him to [facility] place if stable for transport. MD Nevada Crane  Notified.

## 2018-11-22 NOTE — Progress Notes (Addendum)
Met with patient's daughter in person. She requested a 5th visitor, a family member, which is reasonable. She and her sister had a prayer with their pastor and is at peace with their father passing.  Patient appears comfortable. Anticipated hospital death.

## 2018-11-22 NOTE — H&P (Signed)
History and Physical  Jason [name] NLZ:[id number] DOB: 06-15-1927 DOA: 11/04/2018  Referring physician: Dr. Florina Ou PCP: [name], MD  Outpatient Specialists: Hospice Patient coming from: [facility]  Chief Complaint: Altered mental status and diarrhea.  HPI: Jason [name] is a 85 y.o. male with medical history significant for C. difficile colitis no longer on antibiotics for this due to hospice status, bladder cancer, chronic diastolic CHF, basal cell skin cancer, non-Hodgkin lymphoma, paroxysmal A. fib, peptic ulcer disease, previous stroke who presented to [facility] ED from hospice facility due to worsening diarrhea and altered mental status.  Patient is unable to provide a history due to altered mental status.  History is obtained from ED provider, medical records and patient's daughter.  Patient is currently a hospice patient.  Per his daughter, patient can receive IV antibiotics and IV fluids with no pressors.  He is DNR.  ED Course: Severe hypotension and A. fib with RVR despite IV fluid boluses.  C. difficile PCR positive, started on IV Flagyl in the ED.  Per documentation, patient refused oral vancomycin.  Daughter updated of critical condition.  States she wants IV antibiotics and IV fluid only.  TRH asked to admit.  Review of Systems: Review of systems as noted in the HPI. All other systems reviewed and are negative.   Past Medical History:  Diagnosis Date  . Arthritis   . Barrett esophagus   . Bladder cancer ([place]) dx'd 9024,0973   surg only  . CHF (congestive heart failure) ([place])   . Colon polyps    adenomatous  . Difficulty sleeping    OCCASIONALLY  . Diverticulosis of colon (without mention of hemorrhage)   . Frequency of urination   . GERD (gastroesophageal reflux disease)   . Hiatal hernia   . History of bladder cancer 1981   EXCISION ONLY  . History of skin cancer    BASAL CELL  . Hyperlipidemia   . Lymphoma, non-Hodgkin's ([place]) 2011   [place]   . Macular degeneration    LEFT EYE  . Multiple nodules of lung 09/16/2011  . PAF (paroxysmal atrial fibrillation) ([place])   . Personal history of colonic polyps 07/15/2010   tubular adenoma  . Prostate cancer [facility])    patient denies  . PUD (peptic ulcer disease)   . Stomach cancer ([place])    NHL origin  . Stroke ([place])   . Weakness    Past Surgical History:  Procedure Laterality Date  . APPENDECTOMY    . bone spur     left shoulder  . CHOLECYSTECTOMY    . KNEE ARTHROPLASTY Left 07/30/2015   Procedure: LEFT TOTAL KNEE ARTHROPLASTY WITH COMPUTER NAVIGATION;  Surgeon: Rod Can, MD;  Location: [facility];  Service: Orthopedics;  Laterality: Left;  . LASER OF PROSTATE W/ GREEN LIGHT PVP    . TENDON REPAIR     right arm  . TRIGGER FINGER RELEASE     bilateral hands  . WRIST SURGERY     Bilateral    Social History:  reports that he quit smoking about 52 years ago. He quit smokeless tobacco use about 20 years ago. He reports current alcohol use. He reports that he does not use drugs.   No Known Allergies  Family History  Problem Relation Age of Onset  . Heart attack Father   . Other Mother        "old age"      Prior to Admission medications  Medication Sig Start Date End Date Taking? Authorizing Provider  acetaminophen (TYLENOL) 325 MG tablet Take 650 mg by mouth every 6 (six) hours as needed for mild pain.   Yes [provider]  benzonatate (TESSALON) 100 MG capsule Take 100 mg by mouth 3 (three) times daily as needed for cough.   Yes [provider]  calcium carbonate (TUMS - DOSED IN MG ELEMENTAL CALCIUM) 500 MG chewable tablet Chew 2 tablets by mouth every 8 (eight) hours.   Yes [provider]  carbidopa-levodopa (SINEMET IR) 25-100 MG tablet Take 1 tablet by mouth 3 (three) times daily. Take at 8am, 12noon, 5pm 05/14/18  Yes Marcial Pacas, MD  loperamide (IMODIUM) 2 MG capsule Take 4 mg by mouth as needed for diarrhea or loose stools.   Yes  [provider]  Melatonin 3 MG TABS Take 3 mg by mouth at bedtime.   Yes [provider]  metoprolol tartrate (LOPRESSOR) 25 MG tablet Take 0.5 tablets (12.5 mg total) by mouth 2 (two) times daily. Patient taking differently: Take 12.5 mg by mouth daily.  10/23/18  Yes Rai, Ripudeep K, MD  polyvinyl alcohol (LIQUIFILM TEARS) 1.4 % ophthalmic solution Place 1 drop into both eyes as needed for dry eyes.   Yes [provider]  promethazine (PHENERGAN) 6.25 MG/5ML syrup Take 25 mg by mouth every 6 (six) hours as needed for nausea or vomiting.   Yes [provider]  tamsulosin (FLOMAX) 0.4 MG CAPS capsule Take 0.4 mg by mouth at bedtime.   Yes [provider]  furosemide (LASIX) 20 MG tablet Take 1 tablet (20 mg total) by mouth daily. Patient not taking: Reported on 11/11/2018 10/23/18 11/22/18  Rai, Vernelle Emerald, MD  memantine (NAMENDA) 10 MG tablet Take 1 tablet (10 mg total) by mouth 2 (two) times daily. Patient not taking: Reported on 11/08/2018 05/14/18 11/22/18  Marcial Pacas, MD  potassium chloride SA (K-DUR) 20 MEQ tablet Take 2 tablets (40 mEq total) by mouth daily. Patient not taking: Reported on 11/20/2018 10/24/18 11/22/18  Jonnie Finner, DO    Physical Exam: BP (!) 67/48   Pulse (!) 114   Temp 98.2 F (36.8 C) (Oral)   Resp (!) 21   SpO2 95%   . General: 85 y.o. year-old male frail-appearing, obtunded. . Cardiovascular: Irregular rate and rhythm with no rubs or gallops.  No thyromegaly or JVD noted.  Trace lower extremity edema. 2/4 pulses in all 4 extremities. Marland Kitchen Respiratory: Mild diffuse rales bilaterally with no wheezes.  Poor inspiratory effort. . Abdomen: Soft nontender nondistended with normal bowel sounds x4 quadrants. . Muskuloskeletal: No cyanosis, clubbing.  Trace edema noted in lower extremities bilaterally . Neuro: CN II-XII intact, strength, sensation, reflexes . Skin: No ulcerative lesions noted or rashes. . Psychiatry: Unable to  assess due to confusion.           Labs on Admission:  Basic Metabolic Panel: Recent Labs  Lab 11/10/2018 2225  NA 141  K 3.6  CL 109  CO2 18*  GLUCOSE 105*  BUN 44*  CREATININE 2.91*  CALCIUM 8.4*   Liver Function Tests: Recent Labs  Lab 11/13/2018 2225  AST 27  ALT 7  ALKPHOS 47  BILITOT 0.5  PROT 5.8*  ALBUMIN 1.9*   No results for input(s): LIPASE, AMYLASE in the last 168 hours. No results for input(s): AMMONIA in the last 168 hours. CBC: Recent Labs  Lab 11/13/2018 2225  WBC 8.5  NEUTROABS 6.8  HGB  14.8  HCT 47.6  MCV 89.6  PLT 196   Cardiac Enzymes: No results for input(s): CKTOTAL, CKMB, CKMBINDEX, TROPONINI in the last 168 hours.  BNP (last 3 results) Recent Labs    12/22/17 2147  BNP 443.8*    ProBNP (last 3 results) No results for input(s): PROBNP in the last 8760 hours.  CBG: No results for input(s): GLUCAP in the last 168 hours.  Radiological Exams on Admission: No results found.  EKG: I independently viewed the EKG done and my findings are as followed: None available at the time of this visit.  Assessment/Plan Present on Admission: . Clostridium difficile colitis  Active Problems:   Clostridium difficile colitis  Septic shock/hypovolemic shock secondary to C. difficile colitis Presented with diarrhea, lactic acidosis, and severe hypotension C. difficile PCR positive Received IV fluid boluses with mild improvement of blood pressure Currently on IV Flagyl Per documentation patient refused oral vancomycin Hospice patient, updated daughter via phone on 11/22/2018, she wants IV antibiotics and IV fluids but no pressors. Palliative care consulted to assist with the management of this patient in the setting of hospice care. Maintain map greater than 65 if possible Started IV albumin 50 g every 6 hours x4 doses  C. difficile colitis, recurrent Management as stated above  AKI on CKD 3, likely prerenal secondary to dehydration Baseline  creatinine appears to be 1.6 with GFR of 37 Presented with creatinine of 2.91 with GFR of 18 Continue IV fluid hydration Monitor urine output Avoid nephrotoxins Repeat BMP  Lactic acidosis in the setting of septic shock Presented with lactic acid 3.6, worsening Lactic acid 4.7 on 11/22/2018 Given another bolus of IV fluid normal saline x1 L Continue IV fluid maintenance normal saline at 125 cc/h Added IV albumin 50 g every 6 hours x 4 doses to maintain intravascular volume  Paroxysmal A. fib with RVR Obtain twelve-lead EKG Continue IV fluid hydration Maintain map above 65  Pulmonary edema, likely cardiogenic  Family declines pressors Personally reviewed chest x-ray done on admission which showed cardiomegaly with increase in pulmonary vascularity indicative of pulmonary edema and left pleural effusion. IV fluid boluses and IV albumin to maintain map greater than 65 in the setting of septic shock Palliative care team consulted for goals of care  Chronic diastolic CHF Last 2D echo revealed preserved LVEF 5560% with grade 1 diastolic dysfunction Strict I's and O's and daily weight Hold off cardiac medication to avoid further drop in blood pressure  History of bladder cancer/non-Hodgkin lymphoma Hospice care  Goals of care Patient a hospice patient Family wants IV antibiotics and IV fluid Palliative care consult to establish goals of care  Risks: Patient is at high risk for decompensation due to septic shock secondary to C. difficile colitis, multiple comorbidities and advanced age.  Patient will require at least 2 midnights for further evaluation and treatment of present condition.    DVT prophylaxis: Subcu heparin 3 times daily  Code Status: DNR  Family Communication: Updated daughter via phone on 11/22/2018  Disposition Plan: Admit to stepdown unit.  Patient's daughter declines pressors.  Consults called: Palliative care team.  Admission status: Inpatient status     Kayleen Memos MD [facility] Pager [phone]  If 7PM-7AM, please contact night-coverage [email or web address] Password [facility]  11/22/2018, 8:57 AM

## 2018-11-22 NOTE — ED Notes (Signed)
ED TO INPATIENT HANDOFF REPORT  ED Nurse Name and Phone #: Lucita Ferrara Name/Age/Gender Jason [name] 85 y.o. male Room/Bed: WA01/WA01  Code Status   Code Status: DNR  Home/SNF/Other Given to flor Patient oriented to: self, place, time and situation Is this baseline? Yes   Triage Complete: Triage complete  Chief Complaint Diarrhea; Fever  Triage Note Pt BIB EMS from [facility]. Pt has diarrhea, generalized weakness, and fever. Pt has had C diff 2x in the last 2 months. Hospice nurse wanted him to be sent here for c diff and covid testing. They stopped all of his medications x2 weeks ago.    Allergies No Known Allergies  Level of Care/Admitting Diagnosis ED Disposition    ED Disposition Condition Comment   Admit  Hospital Area: [facility] [[id number]]  Level of Care: Stepdown [14]  Admit to SDU based on following criteria: Severe physiological/psychological symptoms:  Any diagnosis requiring assessment & intervention at least every 4 hours on an ongoing basis to obtain desired patient outcomes including stability and rehabilitation  Covid Evaluation: Asymptomatic Screening Protocol (No Symptoms)  Diagnosis: Clostridium difficile colitis [378588]  Admitting Physician: Reubin Milan [[id number]]  Attending Physician: Reubin Milan [[id number]]  Estimated length of stay: past midnight tomorrow  Certification:: I certify this patient will need inpatient services for at least 2 midnights  PT Class (Do Not Modify): Inpatient [101]  PT Acc Code (Do Not Modify): Private [1]       B Medical/Surgery History Past Medical History:  Diagnosis Date  . Arthritis   . Barrett esophagus   . Bladder cancer ([place]) dx'd 7209,4709   surg only  . CHF (congestive heart failure) ([place])   . Colon polyps    adenomatous  . Difficulty sleeping    OCCASIONALLY  . Diverticulosis of colon (without mention of hemorrhage)   . Frequency of urination   .  GERD (gastroesophageal reflux disease)   . Hiatal hernia   . History of bladder cancer 1981   EXCISION ONLY  . History of skin cancer    BASAL CELL  . Hyperlipidemia   . Lymphoma, non-Hodgkin's ([place]) 2011   [place]   . Macular degeneration    LEFT EYE  . Multiple nodules of lung 09/16/2011  . PAF (paroxysmal atrial fibrillation) ([place])   . Personal history of colonic polyps 07/15/2010   tubular adenoma  . Prostate cancer [facility])    patient denies  . PUD (peptic ulcer disease)   . Stomach cancer ([place])    NHL origin  . Stroke ([place])   . Weakness    Past Surgical History:  Procedure Laterality Date  . APPENDECTOMY    . bone spur     left shoulder  . CHOLECYSTECTOMY    . KNEE ARTHROPLASTY Left 07/30/2015   Procedure: LEFT TOTAL KNEE ARTHROPLASTY WITH COMPUTER NAVIGATION;  Surgeon: Rod Can, MD;  Location: [facility];  Service: Orthopedics;  Laterality: Left;  . LASER OF PROSTATE W/ GREEN LIGHT PVP    . TENDON REPAIR     right arm  . TRIGGER FINGER RELEASE     bilateral hands  . WRIST SURGERY     Bilateral     A IV Location/Drains/Wounds Patient Lines/Drains/Airways Status   Active Line/Drains/Airways    Name:   Placement date:   Placement time:   Site:   Days:   Peripheral IV 11/28/2018 Right Antecubital   11/10/2018    2315  Antecubital   1   Peripheral IV 11/22/18 Left Forearm   11/22/18    0117    Forearm   less than 1   External Urinary Catheter   10/28/18    1314    -   25   Pressure Injury 10/26/18 Sacrum Medial Stage II -  Partial thickness loss of dermis presenting as a shallow open ulcer with a red, pink wound bed without slough.   10/26/18    2100     27   Pressure Injury 10/26/18 Sacrum Medial Deep Tissue Injury - Purple or maroon localized area of discolored intact skin or blood-filled blister due to damage of underlying soft tissue from pressure and/or shear.   10/26/18    2100     27          Intake/Output Last 24 hours  Intake/Output Summary (Last 24  hours) at 11/22/2018 1627 Last data filed at 11/22/2018 1536 Gross per 24 hour  Intake 3691.25 ml  Output -  Net 3691.25 ml    Labs/Imaging Results for orders placed or performed during the hospital encounter of 11/03/2018 (from the past 48 hour(s))  Lactic acid, plasma     Status: Abnormal   Collection Time: 11/13/2018 10:25 PM  Result Value Ref Range   Lactic Acid, Venous 3.6 (HH) 0.5 - 1.9 mmol/L    Comment: RN Lenna Sciara [facility] AT 0010 11/22/18 CRUICKSHANK A Performed at [facility], [place] [address], [place], [place] [address]   Comprehensive metabolic panel     Status: Abnormal   Collection Time: 11/06/2018 10:25 PM  Result Value Ref Range   Sodium 141 135 - 145 mmol/L   Potassium 3.6 3.5 - 5.1 mmol/L   Chloride 109 98 - 111 mmol/L   CO2 18 (L) 22 - 32 mmol/L   Glucose, Bld 105 (H) 70 - 99 mg/dL   BUN 44 (H) 8 - 23 mg/dL   Creatinine, Ser 2.91 (H) 0.61 - 1.24 mg/dL   Calcium 8.4 (L) 8.9 - 10.3 mg/dL   Total Protein 5.8 (L) 6.5 - 8.1 g/dL   Albumin 1.9 (L) 3.5 - 5.0 g/dL   AST 27 15 - 41 U/L   ALT 7 0 - 44 U/L   Alkaline Phosphatase 47 38 - 126 U/L   Total Bilirubin 0.5 0.3 - 1.2 mg/dL   GFR calc non Af Amer 18 (L) >60 mL/min   GFR calc Af Amer 21 (L) >60 mL/min   Anion gap 14 5 - 15    Comment: Performed at [facility], [place] [address] [place], [place] [address]  CBC with Differential     Status: Abnormal   Collection Time: 11/16/2018 10:25 PM  Result Value Ref Range   WBC 8.5 4.0 - 10.5 K/uL   RBC 5.31 4.22 - 5.81 MIL/uL   Hemoglobin 14.8 13.0 - 17.0 g/dL   HCT 47.6 39.0 - 52.0 %   MCV 89.6 80.0 - 100.0 fL   MCH 27.9 26.0 - 34.0 pg   MCHC 31.1 30.0 - 36.0 g/dL   RDW 17.3 (H) 11.5 - 15.5 %   Platelets 196 150 - 400 K/uL   nRBC 0.0 0.0 - 0.2 %   Neutrophils Relative % 79 %   Neutro Abs 6.8 1.7 - 7.7 K/uL   Lymphocytes Relative 7 %   Lymphs Abs 0.6 (L) 0.7 - 4.0 K/uL   Monocytes Relative 13 %   Monocytes Absolute 1.1 (H) 0.1 - 1.0  K/uL    Eosinophils Relative 0 %   Eosinophils Absolute 0.0 0.0 - 0.5 K/uL   Basophils Relative 0 %   Basophils Absolute 0.0 0.0 - 0.1 K/uL   WBC Morphology INCREASED BANDS (>20% BANDS)     Comment: MILD LEFT SHIFT (1-5% METAS, OCC MYELO, OCC BANDS)   Immature Granulocytes 1 %   Abs Immature Granulocytes 0.08 (H) 0.00 - 0.07 K/uL    Comment: Performed at [facility], [place] [address] [place], [place] [address]  Lactic acid, plasma     Status: Abnormal   Collection Time: 11/22/18 12:25 AM  Result Value Ref Range   Lactic Acid, Venous 4.7 (HH) 0.5 - 1.9 mmol/L    Comment: CRITICAL RESULT CALLED TO, READ BACK BY AND VERIFIED WITH: rn j inman at 0207 11/22/18 cruickshank a Performed at [facility], [place] [address], [place], [place] [address]   Blood culture (routine x 2)     Status: None (Preliminary result)   Collection Time: 11/22/18 12:29 AM   Specimen: BLOOD LEFT FOREARM  Result Value Ref Range   Specimen Description      BLOOD LEFT FOREARM Performed at [facility], [place] [address], [place], [place] [address]    Special Requests      BOTTLES DRAWN AEROBIC AND ANAEROBIC Blood Culture adequate volume Performed at [place] [address], [place], [place] [address]    Culture PENDING    Report Status PENDING   SARS Coronavirus 2 [facility] order, Performed in [facility] hospital lab) Nasopharyngeal Nasopharyngeal Swab     Status: None   Collection Time: 11/22/18 12:40 AM   Specimen: Nasopharyngeal Swab  Result Value Ref Range   SARS Coronavirus 2 NEGATIVE NEGATIVE    Comment: (NOTE) If result is NEGATIVE SARS-CoV-2 target nucleic acids are NOT DETECTED. The SARS-CoV-2 RNA is generally detectable in upper and lower  respiratory specimens during the acute phase of infection. The lowest  concentration of SARS-CoV-2 viral copies this assay can detect is 250  copies / mL. A negative result does not preclude SARS-CoV-2  infection  and should not be used as the sole basis for treatment or other  patient management decisions.  A negative result may occur with  improper specimen collection / handling, submission of specimen other  than nasopharyngeal swab, presence of viral mutation(s) within the  areas targeted by this assay, and inadequate number of viral copies  (<250 copies / mL). A negative result must be combined with clinical  observations, patient history, and epidemiological information. If result is POSITIVE SARS-CoV-2 target nucleic acids are DETECTED. The SARS-CoV-2 RNA is generally detectable in upper and lower  respiratory specimens dur ing the acute phase of infection.  Positive  results are indicative of active infection with SARS-CoV-2.  Clinical  correlation with patient history and other diagnostic information is  necessary to determine patient infection status.  Positive results do  not rule out bacterial infection or co-infection with other viruses. If result is PRESUMPTIVE POSTIVE SARS-CoV-2 nucleic acids MAY BE PRESENT.   A presumptive positive result was obtained on the submitted specimen  and confirmed on repeat testing.  While 2019 novel coronavirus  (SARS-CoV-2) nucleic acids may be present in the submitted sample  additional confirmatory testing may be necessary for epidemiological  and / or clinical management purposes  to differentiate between  SARS-CoV-2 and other Sarbecovirus currently known to infect humans.  If clinically indicated additional testing with an  alternate test  methodology [phone]) is advised. The SARS-CoV-2 RNA is generally  detectable in upper and lower respiratory sp ecimens during the acute  phase of infection. The expected result is Negative. Fact Sheet for Patients:  StrictlyIdeas.no Fact Sheet for Healthcare Providers: BankingDealers.co.za This test is not yet approved or cleared by the [place]  FDA and has been authorized for detection and/or diagnosis of SARS-CoV-2 by FDA under an Emergency Use Authorization (EUA).  This EUA will remain in effect (meaning this test can be used) for the duration of the COVID-19 declaration under Section 564(b)(1) of the Act, 21 U.S.C. section 360bbb-3(b)(1), unless the authorization is terminated or revoked sooner. Performed at [facility], [place] [address] [place], [place] [address]   C difficile quick scan w PCR reflex     Status: Abnormal   Collection Time: 11/22/18 12:41 AM   Specimen: Nasopharyngeal Swab; Stool  Result Value Ref Range   C Diff antigen POSITIVE (A) NEGATIVE   C Diff toxin POSITIVE (A) NEGATIVE   C Diff interpretation Toxin producing C. difficile detected.     Comment: CRITICAL RESULT CALLED TO, READ BACK BY AND VERIFIED WITH: RAZILLE,C 0340 ON [id number] BY POTEAT,S Performed at [facility] [address] [place], [place] [address]    No results found.  Pending Labs Unresulted Labs (From admission, onward)    Start     Ordered   11/22/18 1124  Magnesium  Once,   STAT     11/22/18 1123   11/22/18 8250  Basic metabolic panel  Daily,   R     11/22/18 1122   11/22/18 1123  CBC with Differential/Platelet  Daily,   R     11/22/18 1122   11/22/18 1123  Phosphorus  Once,   STAT     11/22/18 1123   11/22/18 1122  Lactic acid, plasma  STAT Now then every 3 hours,   R (with STAT occurrences)     11/22/18 1121   11/22/18 1122  Procalcitonin - Baseline  ONCE - STAT,   STAT     11/22/18 1121   11/22/18 0025  Urine culture  ONCE - STAT,   STAT     11/22/18 0026   11/22/18 0024  Blood culture (routine x 2)  BLOOD CULTURE X 2,   STAT     11/22/18 0026   11/03/2018 2225  Urinalysis, Routine w reflex microscopic  ONCE - STAT,   STAT     11/05/2018 2225          Vitals/Pain Today's Vitals   11/22/18 1230 11/22/18 1300 11/22/18 1431 11/22/18 1530  BP: (!) 64/37 (!) 54/22  (!) 103/56   Pulse: (!) 108 (!) 102  (!) 115  Resp:  (!) 21  (!) 21  Temp:      TempSrc:      SpO2: 98% 97%  97%  PainSc:   0-No pain     Isolation Precautions Enteric precautions (UV disinfection)  Medications Medications  vancomycin (VANCOCIN) 50 mg/mL oral solution 500 mg (500 mg Oral Refused 11/22/18 0612)  metroNIDAZOLE (FLAGYL) IVPB 500 mg (0 mg Intravenous Stopped 11/22/18 1536)  0.9 %  sodium chloride infusion ( Intravenous New Bag/Given 11/22/18 1138)  heparin injection 5,000 Units (has no administration in time range)  albumin human 25 % solution 50 g (has no administration in time range)  sodium chloride 0.9 % bolus 1,000 mL (0 mLs Intravenous Stopped 11/22/18 0612)  sodium chloride 0.9 %  bolus 1,000 mL (0 mLs Intravenous Stopped 11/22/18 0821)  cefTRIAXone (ROCEPHIN) 1 g in sodium chloride 0.9 % 100 mL IVPB (0 g Intravenous Stopped 11/22/18 0920)  azithromycin (ZITHROMAX) 500 mg in sodium chloride 0.9 % 250 mL IVPB (0 mg Intravenous Stopped 11/22/18 0921)  sodium chloride 0.9 % bolus 1,000 mL (0 mLs Intravenous Stopped 11/22/18 1107)  sodium chloride 0.9 % bolus 1,000 mL (0 mLs Intravenous Stopped 11/22/18 1535)    Mobility non-ambulatory High fall risk   Focused Assessments Cardiac Assessment Handoff:    Lab Results  Component Value Date   CKTOTAL 125 06/16/2016   TROPONINI 0.04 ([place]) 06/16/2016   No results found for: DDIMER Does the Patient currently have chest pain? No     R Recommendations: See Admitting Provider Note  Report given to:   Additional Notes:

## 2018-11-22 NOTE — ED Provider Notes (Signed)
[facility] DEPT Provider Note: Georgena Spurling, MD, FACEP  CSN: [id number] MRN: [id number] ARRIVAL: 11/15/2018 at 2147 ROOM: WA01/WA01   CHIEF COMPLAINT  Diarrhea  Level 5 caveat: Altered mental status HISTORY OF PRESENT ILLNESS  11/22/18 12:22 AM Jason [name] is a 85 y.o. male who has had 2 admissions to the hospital and July for AKI, sepsis and pneumonia.  He has also been treated for C. difficile colitis but is no longer on antibiotics for this.  He is here after his living facility found him to have an altered mental status, increased work of breathing, diarrhea and fever.  They called his daughter about 7:30 yesterday evening.  While in the ED his daughter notes that his blood pressure has been as low as 60/40 although it is presently 98/68 with a pulse rate of 135.  She describes his diarrhea as "really bad".  Due to his hospice and DNR status he has been taken off all of his normal medications including his anticoagulation.   Past Medical History:  Diagnosis Date  . Arthritis   . Barrett esophagus   . Bladder cancer ([place]) dx'd 5366,4403   surg only  . CHF (congestive heart failure) ([place])   . Colon polyps    adenomatous  . Difficulty sleeping    OCCASIONALLY  . Diverticulosis of colon (without mention of hemorrhage)   . Frequency of urination   . GERD (gastroesophageal reflux disease)   . Hiatal hernia   . History of bladder cancer 1981   EXCISION ONLY  . History of skin cancer    BASAL CELL  . Hyperlipidemia   . Lymphoma, non-Hodgkin's ([place]) 2011   [place]   . Macular degeneration    LEFT EYE  . Multiple nodules of lung 09/16/2011  . PAF (paroxysmal atrial fibrillation) ([place])   . Personal history of colonic polyps 07/15/2010   tubular adenoma  . Prostate cancer [facility])    patient denies  . PUD (peptic ulcer disease)   . Stomach cancer ([place])    NHL origin  . Stroke ([place])   . Weakness     Past Surgical History:  Procedure Laterality Date  .  APPENDECTOMY    . bone spur     left shoulder  . CHOLECYSTECTOMY    . KNEE ARTHROPLASTY Left 07/30/2015   Procedure: LEFT TOTAL KNEE ARTHROPLASTY WITH COMPUTER NAVIGATION;  Surgeon: Rod Can, MD;  Location: [facility] ORS;  Service: Orthopedics;  Laterality: Left;  . LASER OF PROSTATE W/ GREEN LIGHT PVP    . TENDON REPAIR     right arm  . TRIGGER FINGER RELEASE     bilateral hands  . WRIST SURGERY     Bilateral    Family History  Problem Relation Age of Onset  . Heart attack Father   . Other Mother        "old age"    Social History   Tobacco Use  . Smoking status: Former Smoker    Quit date: 10/29/1966    Years since quitting: 52.1  . Smokeless tobacco: Former Systems developer    Quit date: 06/29/1998  Substance Use Topics  . Alcohol use: Yes    Comment: Socially  . Drug use: No    Prior to Admission medications   Medication Sig Start Date End Date Taking? Authorizing Provider  acetaminophen (TYLENOL) 325 MG tablet Take 650 mg by mouth every 6 (six) hours as needed for mild pain.   Yes [provider]  benzonatate (TESSALON) 100 MG capsule Take 100 mg by mouth 3 (three) times daily as needed for cough.   Yes [provider]  calcium carbonate (TUMS - DOSED IN MG ELEMENTAL CALCIUM) 500 MG chewable tablet Chew 2 tablets by mouth every 8 (eight) hours.   Yes [provider]  carbidopa-levodopa (SINEMET IR) 25-100 MG tablet Take 1 tablet by mouth 3 (three) times daily. Take at 8am, 12noon, 5pm 05/14/18  Yes Marcial Pacas, MD  loperamide (IMODIUM) 2 MG capsule Take 4 mg by mouth as needed for diarrhea or loose stools.   Yes [provider]  Melatonin 3 MG TABS Take 3 mg by mouth at bedtime.   Yes [provider]  metoprolol tartrate (LOPRESSOR) 25 MG tablet Take 0.5 tablets (12.5 mg total) by mouth 2 (two) times daily. Patient taking differently: Take 12.5 mg by mouth daily.  10/23/18  Yes Rai, Ripudeep K, MD  polyvinyl alcohol (LIQUIFILM TEARS) 1.4 %  ophthalmic solution Place 1 drop into both eyes as needed for dry eyes.   Yes [provider]  promethazine (PHENERGAN) 6.25 MG/5ML syrup Take 25 mg by mouth every 6 (six) hours as needed for nausea or vomiting.   Yes [provider]  tamsulosin (FLOMAX) 0.4 MG CAPS capsule Take 0.4 mg by mouth at bedtime.   Yes [provider]  furosemide (LASIX) 20 MG tablet Take 1 tablet (20 mg total) by mouth daily. Patient not taking: Reported on 11/05/2018 10/23/18 11/22/18  Rai, Vernelle Emerald, MD  memantine (NAMENDA) 10 MG tablet Take 1 tablet (10 mg total) by mouth 2 (two) times daily. Patient not taking: Reported on 11/11/2018 05/14/18 11/22/18  Marcial Pacas, MD  potassium chloride SA (K-DUR) 20 MEQ tablet Take 2 tablets (40 mEq total) by mouth daily. Patient not taking: Reported on 11/23/2018 10/24/18 11/22/18  Cherylann Ratel A, DO    Allergies Patient has no known allergies.   REVIEW OF SYSTEMS     PHYSICAL EXAMINATION  Initial Vital Signs Blood pressure (!) 83/50, pulse (!) 29, temperature 98.2 F (36.8 C), temperature source Oral, resp. rate (!) 27, SpO2 96 %.  Examination General: Well-developed, well-nourished male in no acute distress; appearance consistent with age of record HENT: normocephalic; atraumatic Eyes: pupils equal, round and reactive to light Neck: supple Heart: Irregular rhythm; tachycardia Lungs: clear to auscultation bilaterally Abdomen: soft; nondistended; nontender; bowel sounds present Extremities: No deformity; full range of motion; pulses weak Neurologic: Awake, alert; nonverbal; motor function intact in all extremities and symmetric; no facial droop Skin: Warm and dry Psychiatric: Flat affect   RESULTS  Summary of this visit's results, reviewed by myself:   EKG Interpretation  Date/Time:    Ventricular Rate:    PR Interval:    QRS Duration:   QT Interval:    QTC Calculation:   R Axis:     Text Interpretation:        Laboratory  Studies: Results for orders placed or performed during the hospital encounter of 11/16/2018 (from the past 24 hour(s))  Lactic acid, plasma     Status: Abnormal   Collection Time: 11/20/2018 10:25 PM  Result Value Ref Range   Lactic Acid, Venous 3.6 (HH) 0.5 - 1.9 mmol/L  Comprehensive metabolic panel     Status: Abnormal   Collection Time: 11/11/2018 10:25 PM  Result Value Ref Range   Sodium 141 135 - 145 mmol/L   Potassium 3.6 3.5 - 5.1 mmol/L   Chloride 109 98 - 111 mmol/L  CO2 18 (L) 22 - 32 mmol/L   Glucose, Bld 105 (H) 70 - 99 mg/dL   BUN 44 (H) 8 - 23 mg/dL   Creatinine, Ser 2.91 (H) 0.61 - 1.24 mg/dL   Calcium 8.4 (L) 8.9 - 10.3 mg/dL   Total Protein 5.8 (L) 6.5 - 8.1 g/dL   Albumin 1.9 (L) 3.5 - 5.0 g/dL   AST 27 15 - 41 U/L   ALT 7 0 - 44 U/L   Alkaline Phosphatase 47 38 - 126 U/L   Total Bilirubin 0.5 0.3 - 1.2 mg/dL   GFR calc non Af Amer 18 (L) >60 mL/min   GFR calc Af Amer 21 (L) >60 mL/min   Anion gap 14 5 - 15  CBC with Differential     Status: Abnormal   Collection Time: 11/15/2018 10:25 PM  Result Value Ref Range   WBC 8.5 4.0 - 10.5 K/uL   RBC 5.31 4.22 - 5.81 MIL/uL   Hemoglobin 14.8 13.0 - 17.0 g/dL   HCT 47.6 39.0 - 52.0 %   MCV 89.6 80.0 - 100.0 fL   MCH 27.9 26.0 - 34.0 pg   MCHC 31.1 30.0 - 36.0 g/dL   RDW 17.3 (H) 11.5 - 15.5 %   Platelets 196 150 - 400 K/uL   nRBC 0.0 0.0 - 0.2 %   Neutrophils Relative % 79 %   Neutro Abs 6.8 1.7 - 7.7 K/uL   Lymphocytes Relative 7 %   Lymphs Abs 0.6 (L) 0.7 - 4.0 K/uL   Monocytes Relative 13 %   Monocytes Absolute 1.1 (H) 0.1 - 1.0 K/uL   Eosinophils Relative 0 %   Eosinophils Absolute 0.0 0.0 - 0.5 K/uL   Basophils Relative 0 %   Basophils Absolute 0.0 0.0 - 0.1 K/uL   WBC Morphology INCREASED BANDS (>20% BANDS)    Immature Granulocytes 1 %   Abs Immature Granulocytes 0.08 (H) 0.00 - 0.07 K/uL  Lactic acid, plasma     Status: Abnormal   Collection Time: 11/22/18 12:25 AM  Result Value Ref Range   Lactic  Acid, Venous 4.7 (HH) 0.5 - 1.9 mmol/L  Blood culture (routine x 2)     Status: None (Preliminary result)   Collection Time: 11/22/18 12:29 AM   Specimen: BLOOD LEFT FOREARM  Result Value Ref Range   Specimen Description      BLOOD LEFT FOREARM Performed at [facility], [address], [place], [place] [address]    Special Requests      BOTTLES DRAWN AEROBIC AND ANAEROBIC Blood Culture adequate volume Performed at [facility] [address], [place], [place] [address]    Culture PENDING    Report Status PENDING   SARS Coronavirus 2 [facility] order, Performed in [facility] hospital lab) Nasopharyngeal Nasopharyngeal Swab     Status: None   Collection Time: 11/22/18 12:40 AM   Specimen: Nasopharyngeal Swab  Result Value Ref Range   SARS Coronavirus 2 NEGATIVE NEGATIVE  C difficile quick scan w PCR reflex     Status: Abnormal   Collection Time: 11/22/18 12:41 AM   Specimen: Nasopharyngeal Swab; Stool  Result Value Ref Range   C Diff antigen POSITIVE (A) NEGATIVE   C Diff toxin POSITIVE (A) NEGATIVE   C Diff interpretation Toxin producing C. difficile detected.    Imaging Studies: No results found.  ED COURSE and MDM  Nursing notes and initial vitals signs, including pulse oximetry, reviewed.  Vitals:  11/22/18 0048 11/22/18 0051 11/22/18 0545 11/22/18 0547  BP: 96/80 (!) 102/59 (!) 63/34 (!) 77/43  Pulse:    (!) 125  Resp: 16  (!) 30 17  Temp:      TempSrc:      SpO2:    95%   12:40 AM IV fluid bolus initiated.  Patient placed on COVID-19 and C. difficile precautions.   5:34 AM Epic back up after prolonged downtime.  Flagyl IV and vancomycin orally (if patient can tolerate) ordered for potentially fulminant C. difficile infection.  5:44 AM Patient's blood pressure dropped again, additional IV fluids ordered.  Even though the patient is DNR the daughter does not wish for [place] to treat his C. difficile infection as this can cause significant  discomfort.  IV Zithromax and Rocephin ordered for healthcare associated pneumonia.  6:18 AM Hospitalist service to admit.  PROCEDURES   CRITICAL CARE Performed by: Karen Chafe Gurshaan Matsuoka Total critical care time: 45 minutes Critical care time was exclusive of separately billable procedures and treating other patients. Critical care was necessary to treat or prevent imminent or life-threatening deterioration. Critical care was time spent personally by me on the following activities: development of treatment plan with patient and/or surrogate as well as nursing, discussions with consultants, evaluation of patient's response to treatment, examination of patient, obtaining history from patient or surrogate, ordering and performing treatments and interventions, ordering and review of laboratory studies, ordering and review of radiographic studies, pulse oximetry and re-evaluation of patient's condition.   ED DIAGNOSES     ICD-10-CM   1. SIRS (systemic inflammatory response syndrome) (HCC)  R65.10   2. AKI (acute kidney injury) ([place])  N17.9   3. Clostridium difficile infection  A49.8   4. Hospice care patient  Z51.5   5. Do not resuscitate status  Z66   6. HCAP (healthcare-associated pneumonia)  J18.9        [place] Pandey, MD 11/22/18 [phone]

## 2018-11-22 NOTE — ED Notes (Signed)
Pt is alert, resting and appears comfortable.  Pt's BP remains low with MAP in 50s.

## 2018-11-22 NOTE — ED Notes (Signed)
Date and time results received: 11/22/18 12:11  Test: Lactic Acid  Critical Value: 3.6   Name of Provider Notified: Dr. Lenna Sciara. [place]   Orders Received? Or Actions Taken?: Continue to monitor patient and will wait for new orders.

## 2018-11-22 NOTE — Progress Notes (Signed)
Two of pt's daughters were bedside and his granddaughter. They were very attentive to him. He woke a couple times to talk, at one time asking about someone whom they told him is deceased. I spent several minutes the family getting to know them and offer presence. While pt was awake we had prayer bedside. Please page if additional support is needed. [place], MDiv   11/22/18 2100  Clinical Encounter Type  Visited With Patient and family together

## 2018-11-22 NOTE — Progress Notes (Signed)
Patient's daughter requested comfort measures only and to stop IV fluid and IV albumin.  Patient made comfort measures only.  Social worker has been consulted for possible residency hospice placement.  All focus on comfort care.

## 2018-11-22 NOTE — ED Notes (Signed)
Pt's daughter was concerned that his MAP is in the 65s.  Hospitalist was notified and made aware.  Daughter also spoke to Palliative and have decided to see if pt will respond to the second round of Albumin and NaCl bolus.  Dr. Nevada Crane Hospitalist was made aware

## 2018-11-22 NOTE — ED Notes (Signed)
Pt's daughter is at bedside.  She's made aware of pt's status and visitor policy.  She verbalized understanding.

## 2018-11-22 NOTE — Progress Notes (Signed)
Manufacturing engineer [facility]) Hospital Liaison note.  Spoke with patient's daughter, [name] by phone.  She states that she is agreeable to focus on comfort care only for patient if the medications given are not improving his hypotension.    I have tried contacting the bedside nurse and Dr. Nevada Crane by phone and have been unable to get through. Please feel free to call me back at [phone].  Thank you, Farrel Gordon, RN, CCM [place] (listed on [place]) [phone]

## 2018-11-23 DIAGNOSIS — A0472 Enterocolitis due to Clostridium difficile, not specified as recurrent: Secondary | ICD-10-CM

## 2018-11-23 DIAGNOSIS — Z515 Encounter for palliative care: Secondary | ICD-10-CM

## 2018-11-23 DIAGNOSIS — N179 Acute kidney failure, unspecified: Secondary | ICD-10-CM

## 2018-11-23 DIAGNOSIS — I480 Paroxysmal atrial fibrillation: Secondary | ICD-10-CM

## 2018-11-23 DIAGNOSIS — Z66 Do not resuscitate: Secondary | ICD-10-CM

## 2018-11-23 LAB — MRSA PCR SCREENING: MRSA by PCR: POSITIVE — AB

## 2018-11-23 NOTE — Progress Notes (Signed)
Responded to spiritual care consult for end of life. Pt was going in and out of awareness and daughter was at bedside. Daughter stated that they have had much support from other staff, Chaplain and a visit from their [place]. I offered continued spiritual care with ministry of presence, and words of comfort. Daughter stated that they were very thankful for the professional care received. I informed her that spiritual care is available as needed.  Chaplain Fidel Levy

## 2018-11-23 NOTE — Progress Notes (Addendum)
Manufacturing engineer [facility]) Hospital Liaison note.  This is a related and covered GIP admission of 11/22/2018 for an [facility] diagnosis of Parkinson's Disease. [name] was transported to ED for evaluation of altered mental status, increased work of breathing, diarrhea and fever. He was admitted for hypotension, SIRS.  Spoke with Dr Erlinda Hong this regarding transfer to [facility] does not have availability today. He is eligible for residential hospice and [facility] will continue to follow him while hospitalized. Spoke with daughter, [name] who states the family elected to transition him to comfort care only.   VS: 98.9, 97, 21, 100%RA I/O: 4581.3/50  Labs: (08/19) CO218, Glucose 105, BUN 44, Creatinine 2.91, Calcium 8.4, Albumin 1.9, total protein 5.8, GFR 18, Lactic acid 3.6RDW 17.3  (8/21) lactic acidd 2.2  Medications: no continuous or prn medications given.   MD notes:  Assessment/Plan Present on Admission:  Clostridium difficile colitis  Active Problems:   Clostridium difficile colitis  Septic shock/hypovolemic shock secondary to C. difficile colitis Presented with diarrhea, lactic acidosis, and severe hypotension C. difficile PCR positive  After initial treatment in ED patient was transitioned to comfort care only.   Communication with PCG: Spoke with daughter, [name] who would like for patient to transfer to [facility] if a room becomes available. Provided emotional support. [name] is appreciative of hospice support.  Communication with IDG: team updated  [place]:  Comfort care only with transfer to [facility].   Please use [facility] for transport. [facility] is contracted with [facility] for all active hospice patients.  Please call with any hospice related questions.  Thank you, Farrel Gordon RN, [facility] Liaison [phone]   In-Patient Referral v18.3 [facility] Transfer Summary 18) Routine visit - on 11-23-2018 Manufacturing engineer ([facility] [place] [facility] [place] [facility] [place]) Patient: [id number] Jason [name], Jason [name] Documentation Advanced Directive: Select all that apply: Has Out of Facility DNR Transfer: Date: 11/28/2018, Patient went from: [name], Patient [facility] to: [facility] Patient Information: Yes, Date of Birth: 15-Jul-1927, Significant Health History: TERMINAL  DIAGNOSIS: Parkinson's disease RELATED  DISEASES:  Recurrent aspiration pneumonia with SIRS, dysphagia, dementia, C. diff colitis, diastolic HF, PAF, stage 3 CKD, CVA, HTN, HLD, personal history of bladder cancer and NHL, stage 1 sacral pressure decubitus, GERD, BPH., Date of [facility] Admission: 10/24/2018, Status Upon Transfer: unstable Psychological Issues to be Aware of: anxiety related to diagnosis and hospitalization Pain and Symptom Management Needs: fever, hypotension Services Provided by Hospice: nurse, aid, csw, MD Ongoing Needs Hospice Unable to Meet: managment of hypotension End of Life Decisions: DNR Transfer Order: Yes Directions: Patient was transported to ED for evaluation To Whom Report Was Given/Date: [facility] was not notified prior to transfer Transfer Physician/Phone Number: Dr. Lona Kettle, Primary Caregiver/Phone Number: Jason [name]  [phone]

## 2018-11-23 NOTE — TOC Progression Note (Signed)
Transition of Care [facility]) - Progression Note    Patient Details  Name: Jason [name] MRN: [id number] Date of Birth: 1927/08/29  [facility] of [facility]) CM/SW Contact  Joaquin Courts, RN Phone Number: 11/23/2018, 11:07 AM  Clinical Narrative:   CM spoke with [facility] liaison, Audrea Muscat, no [facility] bed available today.  Anticipate bed availability tomorrow.           Expected Discharge Plan and Services           Expected Discharge Date: 11/23/18                                     Social Determinants of Health (SDOH) Interventions    Readmission Risk Interventions No flowsheet data found.

## 2018-11-23 NOTE — Progress Notes (Signed)
Pt complaining of needing help to pee despite condom catheter on. Bladder scanner showed 300 mL retained. Family informed that if retention continues in and out cath maybe needed for relief. Daughter verbalized understanding. Will continue to monitor.

## 2018-11-23 NOTE — Progress Notes (Signed)
PROGRESS NOTE  Jason [name] [id number] DOB: 1928-02-16 DOA: 11/15/2018 PCP: [name], MD  HPI/Recap of past 24 hours:  Patient is alert, appear comfortable, daughter at bedside  Assessment/Plan: Active Problems:   Pressure injury of skin   Clostridium difficile colitis  Severe septic shock/hypovolemic shock /lactic acidosis/AKI/ acute metabolic encephalopathy /secondary to recurrent cdiff colitis -patient was on hospice service at [facility], she is sent from [facility] to the hospital due to diarrhea, lethargy -while staying in the ER, he has severe hypotension with afib/rvr despite iv fluids boluses, c diff pcr positive, started on IV Flagyl in the ED.  Per documentation, patient refused oral vancomycin.  Daughter updated of critical condition.  States she wants IV antibiotics and IV fluid only, thus patient is admitted to the hospital -family has discussed [place] with hospice liaison and transitioned care to full comfort, awaiting for residential hospice facility placement -there is not bed today, continue full comfort measures, awaiting for bed.    PAF/chronic diastolic chf On full comfort measures  H/o CVA with mild right side weakness, h/o mild parkinson's with gait abnormality , h/o memory loss On full comfort measures  History of bladder cancer/non-Hodgkin lymphoma Hospice care  Stage II pressure ulcer , back, presents on admission Skin care, comfort measures  DVT Prophylaxis: On full comfort measures  Code Status: DNR  Family Communication: patient and daugter at bedside   Disposition Plan: discharge to residential hospice when bed is available    Consultants:  Palliative care  hospice  Procedures:  none  Antibiotics:  As above    Objective: BP (!) 85/49   Pulse 93   Temp 98.9 F (37.2 C) (Oral)   Resp 19   Ht 5\' 11"  (1.803 m)   Wt 77.9 kg   SpO2 100%   BMI 23.95 kg/m   Intake/Output Summary (Last 24  hours) at 11/23/2018 1328 Last data filed at 11/23/2018 0500 Gross per 24 hour  Intake 2031.26 ml  Output 50 ml  Net 1981.26 ml   Filed Weights   11/22/18 1700  Weight: 77.9 kg    Exam: Patient is examined daily including today on 11/23/2018, exams remain the same as of yesterday except that has changed    General:  Frail, alert, not recognizing daughter (daughter states this is baseline)  Cardiovascular: IRRR  Respiratory: CTABL  Abdomen: Soft/ND/NT, positive BS  Musculoskeletal: No Edema  Neuro: alert, oriented to self only  Data Reviewed: Basic Metabolic Panel: Recent Labs  Lab 12/02/2018 2225  NA 141  K 3.6  CL 109  CO2 18*  GLUCOSE 105*  BUN 44*  CREATININE 2.91*  CALCIUM 8.4*   Liver Function Tests: Recent Labs  Lab 11/09/2018 2225  AST 27  ALT 7  ALKPHOS 47  BILITOT 0.5  PROT 5.8*  ALBUMIN 1.9*   No results for input(s): LIPASE, AMYLASE in the last 168 hours. No results for input(s): AMMONIA in the last 168 hours. CBC: Recent Labs  Lab 11/28/2018 2225  WBC 8.5  NEUTROABS 6.8  HGB 14.8  HCT 47.6  MCV 89.6  PLT 196   Cardiac Enzymes:   No results for input(s): CKTOTAL, CKMB, CKMBINDEX, TROPONINI in the last 168 hours. BNP (last 3 results) Recent Labs    12/22/17 2147  BNP 443.8*    ProBNP (last 3 results) No results for input(s): PROBNP in the last 8760 hours.  CBG: No results for input(s): GLUCAP in the last 168 hours.  Recent Results (from the past 240 hour(s))  Blood culture (routine x 2)     Status: None (Preliminary result)   Collection Time: 11/22/18 12:24 AM   Specimen: BLOOD  Result Value Ref Range Status   Specimen Description   Final    BLOOD RIGHT ANTECUBITAL Performed at [facility] [address] [place], [place] [address]    Special Requests   Final    BOTTLES DRAWN AEROBIC AND ANAEROBIC Blood Culture adequate volume Performed at [facility] [address]  [place], [place] [address]    Culture   Final    NO GROWTH 1 DAY Performed at [facility], [place] [address] [place], [place] [address]    Report Status PENDING  Incomplete  Blood culture (routine x 2)     Status: None (Preliminary result)   Collection Time: 11/22/18 12:29 AM   Specimen: BLOOD LEFT FOREARM  Result Value Ref Range Status   Specimen Description   Final    BLOOD LEFT FOREARM Performed at [facility], [place] [address] [place], [place] [address]    Special Requests   Final    BOTTLES DRAWN AEROBIC AND ANAEROBIC Blood Culture adequate volume Performed at [facility] [address] [place], [place] [address]    Culture   Final    NO GROWTH 1 DAY Performed at [facility], [place] [address] [place],  [address]    Report Status PENDING  Incomplete  SARS Coronavirus 2 [facility] order, Performed in [facility] hospital lab) Nasopharyngeal Nasopharyngeal Swab     Status: None   Collection Time: 11/22/18 12:40 AM   Specimen: Nasopharyngeal Swab  Result Value Ref Range Status   SARS Coronavirus 2 NEGATIVE NEGATIVE Final    Comment: (NOTE) If result is NEGATIVE SARS-CoV-2 target nucleic acids are NOT DETECTED. The SARS-CoV-2 RNA is generally detectable in upper and lower  respiratory specimens during the acute phase of infection. The lowest  concentration of SARS-CoV-2 viral copies this assay can detect is 250  copies / mL. A negative result does not preclude SARS-CoV-2 infection  and should not be used as the sole basis for treatment or other  patient management decisions.  A negative result may occur with  improper specimen collection / handling, submission of specimen other  than nasopharyngeal swab, presence of viral mutation(s) within the  areas targeted by this assay, and inadequate number of viral copies  (<250 copies / mL). A negative result must be combined with clinical  observations, patient history, and epidemiological  information. If result is POSITIVE SARS-CoV-2 target nucleic acids are DETECTED. The SARS-CoV-2 RNA is generally detectable in upper and lower  respiratory specimens dur ing the acute phase of infection.  Positive  results are indicative of active infection with SARS-CoV-2.  Clinical  correlation with patient history and other diagnostic information is  necessary to determine patient infection status.  Positive results do  not rule out bacterial infection or co-infection with other viruses. If result is PRESUMPTIVE POSTIVE SARS-CoV-2 nucleic acids MAY BE PRESENT.   A presumptive positive result was obtained on the submitted specimen  and confirmed on repeat testing.  While 2019 novel coronavirus  (SARS-CoV-2) nucleic acids may be present in the submitted sample  additional confirmatory testing may be necessary for epidemiological  and / or clinical management purposes  to differentiate between  SARS-CoV-2 and other Sarbecovirus currently known to infect humans.  If clinically indicated  additional testing with an alternate test  methodology [phone]) is advised. The SARS-CoV-2 RNA is generally  detectable in upper and lower respiratory sp ecimens during the acute  phase of infection. The expected result is Negative. Fact Sheet for Patients:  StrictlyIdeas.no Fact Sheet for Healthcare Providers: BankingDealers.co.za This test is not yet approved or cleared by the [place] FDA and has been authorized for detection and/or diagnosis of SARS-CoV-2 by FDA under an Emergency Use Authorization (EUA).  This EUA will remain in effect (meaning this test can be used) for the duration of the COVID-19 declaration under Section 564(b)(1) of the Act, 21 U.S.C. section 360bbb-3(b)(1), unless the authorization is terminated or revoked sooner. Performed at [facility], [place] [address], [place], [place] [address]   C difficile  quick scan w PCR reflex     Status: Abnormal   Collection Time: 11/22/18 12:41 AM   Specimen: Nasopharyngeal Swab; Stool  Result Value Ref Range Status   C Diff antigen POSITIVE (A) NEGATIVE Final   C Diff toxin POSITIVE (A) NEGATIVE Final   C Diff interpretation Toxin producing C. difficile detected.  Final    Comment: CRITICAL RESULT CALLED TO, READ BACK BY AND VERIFIED WITH: RAZILLE,C 0340 ON [id number] BY POTEAT,S Performed at [facility] [address] [place], [place] [address]   MRSA PCR Screening     Status: Abnormal   Collection Time: 11/23/18  5:03 AM   Specimen: Nasal Mucosa; Nasopharyngeal  Result Value Ref Range Status   MRSA by PCR POSITIVE (A) NEGATIVE Final    Comment:        The GeneXpert MRSA Assay (FDA approved for NASAL specimens only), is one component of a comprehensive MRSA colonization surveillance program. It is not intended to diagnose MRSA infection nor to guide or monitor treatment for MRSA infections. RESULT CALLED TO, READ BACK BY AND VERIFIED WITH: LETCHFORD, H. RN @0751  ON 8.21.2020 BY NMCCOY Performed at [facility] [address], [place], [place] [address]      Studies: No results found.  Scheduled Meds: . scopolamine  1 patch Transdermal Q72H  . sodium chloride flush  3 mL Intravenous Q12H    Continuous Infusions: . sodium chloride       Time spent: 87mins, case discussed with hospice liaison  I have personally reviewed and interpreted on  11/23/2018 daily labs, tele strips, imagings as discussed above under date review session and assessment and plans.  I reviewed all nursing notes, pharmacy notes, consultant notes,  vitals, pertinent old records  I have discussed plan of care as described above with RN , patient and family on 11/23/2018   Florencia Reasons MD, PhD, FACP  [facility] Pager [phone]. If 7PM-7AM, please contact night-coverage at [email or web address], password [facility] 11/23/2018, 1:28 PM   LOS: 1 day

## 2018-11-24 NOTE — TOC Progression Note (Signed)
Transition of Care [facility]) - Progression Note    Patient Details  Name: Jason [name] MRN: [id number] Date of Birth: 1928-01-24  Transition of Care [facility]) CM/SW Contact  Joaquin Courts, RN Phone Number: 11/24/2018, 11:49 AM  Clinical Narrative:    CM spoke with hospice liaison, Venia Carbon, no beds at [facility] today.          Expected Discharge Plan and Services           Expected Discharge Date: 11/23/18                                     Social Determinants of Health (SDOH) Interventions    Readmission Risk Interventions No flowsheet data found.

## 2018-11-24 NOTE — Progress Notes (Signed)
[facility] ([facility]) GIP RN Note @ 330 pm  This is a related and covered GIP admission of 11/22/2018 with an [facility] diagnosis of Parkinson's Disease per Dr. Konrad Dolores . Patient was transported to ED for evaluation of altered mental status, increased work of breathing, diarrhea and fever after sustaining a fall at his facility. He is admitted to the hospital with diarrhea and septic shock.  V/S: 95.3 axillary, 96/62, HR 120, RR 16, 100% SPO2 room air I&O: no intake recorded/270 IVs/PRNs:  Dilaudid 0.5 mg IV x 2 for pain Lab work:  None, pt it comfort care Diagnostics:  None, pt is comfort care  Report exchanged, patient remains intermittently alert, stable for transfer to [facility] for EOL care.  No beds currently available at [facility].    Problem List: C-diff colitis:  Has stopped abx in lieu of transitioning to full comfort measures Septic shock:  Pt is full comfort  D/C plan:  [employer], once bed is available.  On 8/22, stable to transfer. IDT:  Updated Family:  Left msg GOC:  Clear.  Full comfort measures, maintain his dignity.  [facility] will follow up tomorrow with bed availability update.  Thank you, Venia Carbon RN, BSN, [facility] Liaison (in [place]) [phone]

## 2018-11-24 NOTE — Progress Notes (Signed)
Pt's daughter and grandson were bedside when I arrived. She said they had just given pt something for pain. Presently they were visiting comfortably and did not have any needs. Please page if support is needed. [place], MDiv   11/23/18 1400  Clinical Encounter Type  Visited With Patient and family together

## 2018-11-24 NOTE — Progress Notes (Signed)
PROGRESS NOTE  Jason [name] [id number] DOB: 09/07/1927 DOA: 11/29/2018 PCP: [name], MD  HPI/Recap of past 24 hours:  Patient is alert, appear comfortable, daughter at bedside  On full comfort measures  Awaiting for bed at residential hospice  Assessment/Plan: Active Problems:   PAF (paroxysmal atrial fibrillation) ([place])   Pressure injury of skin   Clostridium difficile colitis   Do not resuscitate status   Comfort measures only status  Severe septic shock/hypovolemic shock /lactic acidosis/AKI/ acute metabolic encephalopathy /secondary to recurrent cdiff colitis -patient was on hospice service at [facility], she is sent from [facility] to the hospital due to diarrhea, lethargy -while staying in the ER, he has severe hypotension with afib/rvr despite iv fluids boluses, c diff pcr positive, started on IV Flagyl in the ED.  Per documentation, patient refused oral vancomycin.  Daughter updated of critical condition.  States she wants IV antibiotics and IV fluid only, thus patient is admitted to the hospital -family has discussed [place] with hospice liaison and transitioned care to full comfort, awaiting for residential hospice facility placement -there is not bed today, continue full comfort measures, awaiting for bed.    PAF/chronic diastolic chf On full comfort measures  H/o CVA with mild right side weakness, h/o mild parkinson's with gait abnormality , h/o memory loss On full comfort measures  History of bladder cancer/non-Hodgkin lymphoma Hospice care  Stage II pressure ulcer , back, presents on admission Skin care, comfort measures  DVT Prophylaxis: On full comfort measures  Code Status: DNR  Family Communication: patient and daugter at bedside   Disposition Plan: discharge to residential hospice when bed is available    Consultants:  Palliative care  hospice  Procedures:  none  Antibiotics:  As above    Objective:  BP 96/62 (BP Location: Left Arm)   Pulse (!) 117   Temp (!) 95.3 F (35.2 C) (Axillary)   Resp 16   Ht 5\' 11"  (1.803 m)   Wt 77.9 kg   SpO2 100%   BMI 23.95 kg/m   Intake/Output Summary (Last 24 hours) at 11/24/2018 1052 Last data filed at 11/24/2018 0700 Gross per 24 hour  Intake -  Output 270 ml  Net -270 ml   Filed Weights   11/22/18 1700  Weight: 77.9 kg    Exam: Patient is examined daily including today on 11/24/2018, exams remain the same as of yesterday except that has changed    General:  Frail, alert, not recognizing daughter (daughter states this is baseline)  Cardiovascular: IRRR  Respiratory: CTABL  Abdomen: Soft/ND/NT, positive BS  Musculoskeletal: No Edema  Neuro: alert, oriented to self only  Data Reviewed: Basic Metabolic Panel: Recent Labs  Lab 11/12/2018 2225  NA 141  K 3.6  CL 109  CO2 18*  GLUCOSE 105*  BUN 44*  CREATININE 2.91*  CALCIUM 8.4*   Liver Function Tests: Recent Labs  Lab 11/06/2018 2225  AST 27  ALT 7  ALKPHOS 47  BILITOT 0.5  PROT 5.8*  ALBUMIN 1.9*   No results for input(s): LIPASE, AMYLASE in the last 168 hours. No results for input(s): AMMONIA in the last 168 hours. CBC: Recent Labs  Lab 11/17/2018 2225  WBC 8.5  NEUTROABS 6.8  HGB 14.8  HCT 47.6  MCV 89.6  PLT 196   Cardiac Enzymes:   No results for input(s): CKTOTAL, CKMB, CKMBINDEX, TROPONINI in the last 168 hours. BNP (last 3 results) Recent Labs  12/22/17 2147  BNP 443.8*    ProBNP (last 3 results) No results for input(s): PROBNP in the last 8760 hours.  CBG: No results for input(s): GLUCAP in the last 168 hours.  Recent Results (from the past 240 hour(s))  Blood culture (routine x 2)     Status: None (Preliminary result)   Collection Time: 11/22/18 12:24 AM   Specimen: BLOOD  Result Value Ref Range Status   Specimen Description   Final    BLOOD RIGHT ANTECUBITAL Performed at [facility] [address]  [place], [place] [address]    Special Requests   Final    BOTTLES DRAWN AEROBIC AND ANAEROBIC Blood Culture adequate volume Performed at [facility] [address] [place], [place] [address]    Culture   Final    NO GROWTH 2 DAYS Performed at [facility] [address], [place], [place] [address]    Report Status PENDING  Incomplete  Blood culture (routine x 2)     Status: None (Preliminary result)   Collection Time: 11/22/18 12:29 AM   Specimen: BLOOD LEFT FOREARM  Result Value Ref Range Status   Specimen Description   Final    BLOOD LEFT FOREARM Performed at [facility], [place] [address], [place], [place] [address]    Special Requests   Final    BOTTLES DRAWN AEROBIC AND ANAEROBIC Blood Culture adequate volume Performed at [facility] [address], [place], [place] [address]    Culture   Final    NO GROWTH 2 DAYS Performed at [facility] [address] [place], [place] [address]    Report Status PENDING  Incomplete  SARS Coronavirus 2 [facility] order, Performed in [facility] hospital lab) Nasopharyngeal Nasopharyngeal Swab     Status: None   Collection Time: 11/22/18 12:40 AM   Specimen: Nasopharyngeal Swab  Result Value Ref Range Status   SARS Coronavirus 2 NEGATIVE NEGATIVE Final    Comment: (NOTE) If result is NEGATIVE SARS-CoV-2 target nucleic acids are NOT DETECTED. The SARS-CoV-2 RNA is generally detectable in upper and lower  respiratory specimens during the acute phase of infection. The lowest  concentration of SARS-CoV-2 viral copies this assay can detect is 250  copies / mL. A negative result does not preclude SARS-CoV-2 infection  and should not be used as the sole basis for treatment or other  patient management decisions.  A negative result may occur with  improper specimen collection / handling, submission of specimen other  than nasopharyngeal swab, presence of viral mutation(s) within the   areas targeted by this assay, and inadequate number of viral copies  (<250 copies / mL). A negative result must be combined with clinical  observations, patient history, and epidemiological information. If result is POSITIVE SARS-CoV-2 target nucleic acids are DETECTED. The SARS-CoV-2 RNA is generally detectable in upper and lower  respiratory specimens dur ing the acute phase of infection.  Positive  results are indicative of active infection with SARS-CoV-2.  Clinical  correlation with patient history and other diagnostic information is  necessary to determine patient infection status.  Positive results do  not rule out bacterial infection or co-infection with other viruses. If result is PRESUMPTIVE POSTIVE SARS-CoV-2 nucleic acids MAY BE PRESENT.   A presumptive positive result was obtained on the submitted specimen  and confirmed on repeat testing.  While 2019 novel coronavirus  (SARS-CoV-2) nucleic acids may be present in the submitted  sample  additional confirmatory testing may be necessary for epidemiological  and / or clinical management purposes  to differentiate between  SARS-CoV-2 and other Sarbecovirus currently known to infect humans.  If clinically indicated additional testing with an alternate test  methodology [phone]) is advised. The SARS-CoV-2 RNA is generally  detectable in upper and lower respiratory sp ecimens during the acute  phase of infection. The expected result is Negative. Fact Sheet for Patients:  StrictlyIdeas.no Fact Sheet for Healthcare Providers: BankingDealers.co.za This test is not yet approved or cleared by the [place] FDA and has been authorized for detection and/or diagnosis of SARS-CoV-2 by FDA under an Emergency Use Authorization (EUA).  This EUA will remain in effect (meaning this test can be used) for the duration of the COVID-19 declaration under Section 564(b)(1) of the Act, 21 U.S.C.  section 360bbb-3(b)(1), unless the authorization is terminated or revoked sooner. Performed at [facility], [place] [address] [place], [place] [address]   C difficile quick scan w PCR reflex     Status: Abnormal   Collection Time: 11/22/18 12:41 AM   Specimen: Nasopharyngeal Swab; Stool  Result Value Ref Range Status   C Diff antigen POSITIVE (A) NEGATIVE Final   C Diff toxin POSITIVE (A) NEGATIVE Final   C Diff interpretation Toxin producing C. difficile detected.  Final    Comment: CRITICAL RESULT CALLED TO, READ BACK BY AND VERIFIED WITH: RAZILLE,C 0340 ON [id number] BY POTEAT,S Performed at [facility] [address], [place], [place] [address]   MRSA PCR Screening     Status: Abnormal   Collection Time: 11/23/18  5:03 AM   Specimen: Nasal Mucosa; Nasopharyngeal  Result Value Ref Range Status   MRSA by PCR POSITIVE (A) NEGATIVE Final    Comment:        The GeneXpert MRSA Assay (FDA approved for NASAL specimens only), is one component of a comprehensive MRSA colonization surveillance program. It is not intended to diagnose MRSA infection nor to guide or monitor treatment for MRSA infections. RESULT CALLED TO, READ BACK BY AND VERIFIED WITH: LETCHFORD, H. RN @0751  ON 8.21.2020 BY [facility] Performed at [facility] [address], [place], [place] [address]      Studies: No results found.  Scheduled Meds: . scopolamine  1 patch Transdermal Q72H  . sodium chloride flush  3 mL Intravenous Q12H    Continuous Infusions: . sodium chloride       Time spent: 38mins, case discussed with hospice liaison and RN I have personally reviewed and interpreted on  11/24/2018 daily labs, imagings as discussed above under date review session and assessment and plans.  I reviewed all nursing notes, pharmacy notes,   vitals, pertinent old records  I have discussed plan of care as described above with RN , patient and family on  11/24/2018   Florencia Reasons MD, PhD, FACP  [facility] Pager [phone]. If 7PM-7AM, please contact night-coverage at [email or web address], password [facility] 11/24/2018, 10:52 AM  LOS: 2 days

## 2018-11-24 NOTE — Progress Notes (Signed)
Manufacturing engineer [facility])  [facility] does not have an available bed today for Mr. Shayne.  Will update hospital team and family.  [facility] will update should one become available tomorrow.  Thank you, Venia Carbon RN, BSN, [facility] Liaison (in [place]) [phone]

## 2018-11-25 NOTE — Plan of Care (Signed)
Comfort care measures only.

## 2018-11-25 NOTE — Progress Notes (Signed)
Manufacturing engineer [facility]) [facility] does have a bed for Jason [name]. Spoke with [name], daughter and [facility], she said she was afraid he would not make it through the transport and requested that the MD determine if he was stable enough to transfer.  Dr. Erlinda Hong agreed to go and assess.  Thank you, Venia Carbon RN, BSN, [facility]iaison (in [place]) [phone]

## 2018-11-25 NOTE — Progress Notes (Addendum)
PROGRESS NOTE  Jason [name] [id number] DOB: [birth year] DOA: 11/11/2018 PCP: [name], MD  Chief Complaint: Altered mental status and diarrhea.  HPI: Jason [name] is a 85 y.o. male with medical history significant for C. difficile colitis no longer on antibiotics for this due to hospice status, bladder cancer, chronic diastolic CHF, basal cell skin cancer, non-Hodgkin lymphoma, paroxysmal A. fib, peptic ulcer disease, previous stroke who presented to [facility] ED from hospice facility due to worsening diarrhea and altered mental status.  Patient is unable to provide a history due to altered mental status.  History is obtained from ED provider, medical records and patient's daughter.  Patient is currently a hospice patient.  Per his daughter, patient can receive IV antibiotics and IV fluids with no pressors.  He is DNR.  ED Course: Severe hypotension and A. fib with RVR despite IV fluid boluses.  C. difficile PCR positive, started on IV Flagyl in the ED.  Per documentation, patient refused oral vancomycin.  Daughter updated of critical condition.  States she wants IV antibiotics and IV fluid only.  TRH asked to admit.     HPI/Recap of past 24 hours:  Patient has declined, he is not awake, breathing pattern has changed Anticipate in hospital death, will hold off transport to residential hospice   Multiple family at bedside  On full comfort measures   Assessment/Plan: Active Problems:   PAF (paroxysmal atrial fibrillation) (HCC)   Pressure injury of skin   Clostridium difficile colitis   Do not resuscitate status   Comfort measures only status  Severe septic shock/hypovolemic shock /lactic acidosis/AKI/ acute metabolic encephalopathy /secondary to recurrent cdiff colitis -patient was on hospice service at [facility], she is sent from [place] to the hospital due to diarrhea, lethargy -while staying in the ER, he has severe hypotension with afib/rvr  despite iv fluids boluses, c diff pcr positive, started on IV Flagyl in the ED.  Per documentation, patient refused oral vancomycin.  Daughter updated of critical condition.  States she wants IV antibiotics and IV fluid only, thus patient is admitted to the hospital -family has discussed [place] with hospice liaison and transitioned care to full comfort, awaiting for residential hospice facility placement -full comfort measures, he has declined, not stable to transport to residential hospice, anticipate inhospital death.    PAF/chronic diastolic chf On full comfort measures  H/o CVA with mild right side weakness, h/o mild parkinson's with gait abnormality , h/o memory loss On full comfort measures  History of bladder cancer/non-Hodgkin lymphoma Hospice care  Stage II pressure ulcer , back, presents on admission Skin care, comfort measures  DVT Prophylaxis: On full comfort measures  Code Status: DNR  Family Communication: patient and daugter at bedside   Disposition Plan: anticipate inhospital death.    Consultants:  Palliative care  hospice  Procedures:  none  Antibiotics:  As above    Objective: BP 96/62 (BP Location: Left Arm)   Pulse (!) 117   Temp (!) 95.3 F (35.2 C) (Axillary)   Resp 16   Ht 5\' 11"  (1.803 m)   Wt 77.9 kg   SpO2 100%   BMI 23.95 kg/m   Intake/Output Summary (Last 24 hours) at 11/25/2018 1033 Last data filed at 11/25/2018 0900 Gross per 24 hour  Intake 80 ml  Output 95 ml  Net -15 ml   Filed Weights   11/22/18 1700  Weight: 77.9 kg    Exam: Patient is examined  daily including today on 11/25/2018, exams remain the same as of yesterday except that has changed    General: not awake, breathing pattern has changed  Cardiovascular: IRRR  Data Reviewed: Basic Metabolic Panel: Recent Labs  Lab 11/23/2018 2225  NA 141  K 3.6  CL 109  CO2 18*  GLUCOSE 105*  BUN 44*  CREATININE 2.91*  CALCIUM 8.4*   Liver Function Tests:  Recent Labs  Lab 11/12/2018 2225  AST 27  ALT 7  ALKPHOS 47  BILITOT 0.5  PROT 5.8*  ALBUMIN 1.9*   No results for input(s): LIPASE, AMYLASE in the last 168 hours. No results for input(s): AMMONIA in the last 168 hours. CBC: Recent Labs  Lab 11/08/2018 2225  WBC 8.5  NEUTROABS 6.8  HGB 14.8  HCT 47.6  MCV 89.6  PLT 196   Cardiac Enzymes:   No results for input(s): CKTOTAL, CKMB, CKMBINDEX, TROPONINI in the last 168 hours. BNP (last 3 results) Recent Labs    12/22/17 2147  BNP 443.8*    ProBNP (last 3 results) No results for input(s): PROBNP in the last 8760 hours.  CBG: No results for input(s): GLUCAP in the last 168 hours.  Recent Results (from the past 240 hour(s))  Blood culture (routine x 2)     Status: None (Preliminary result)   Collection Time: 11/22/18 12:24 AM   Specimen: BLOOD  Result Value Ref Range Status   Specimen Description   Final    BLOOD RIGHT ANTECUBITAL Performed at [facility] [address], [place], [place] [address]    Special Requests   Final    BOTTLES DRAWN AEROBIC AND ANAEROBIC Blood Culture adequate volume Performed at [facility] [address], [place], [place] [address]    Culture   Final    NO GROWTH 3 DAYS Performed at [facility], [place] [address] [place], [place] [address]    Report Status PENDING  Incomplete  Blood culture (routine x 2)     Status: None (Preliminary result)   Collection Time: 11/22/18 12:29 AM   Specimen: BLOOD LEFT FOREARM  Result Value Ref Range Status   Specimen Description   Final    BLOOD LEFT FOREARM Performed at [facility], [place] [address], [place], [place] [address]    Special Requests   Final    BOTTLES DRAWN AEROBIC AND ANAEROBIC Blood Culture adequate volume Performed at [facility] [address] [place], [place] [address]    Culture   Final    NO GROWTH 3 DAYS Performed at [facility], [place]  [address] [place],  [address]    Report Status PENDING  Incomplete  SARS Coronavirus 2 [facility] order, Performed in [facility] hospital lab) Nasopharyngeal Nasopharyngeal Swab     Status: None   Collection Time: 11/22/18 12:40 AM   Specimen: Nasopharyngeal Swab  Result Value Ref Range Status   SARS Coronavirus 2 NEGATIVE NEGATIVE Final    Comment: (NOTE) If result is NEGATIVE SARS-CoV-2 target nucleic acids are NOT DETECTED. The SARS-CoV-2 RNA is generally detectable in upper and lower  respiratory specimens during the acute phase of infection. The lowest  concentration of SARS-CoV-2 viral copies this assay can detect is 250  copies / mL. A negative result does not preclude SARS-CoV-2 infection  and should not be used as the sole basis for treatment or other  patient management decisions.  A negative result may occur with  improper specimen collection / handling, submission of specimen other  than nasopharyngeal swab, presence of viral mutation(s) within the  areas targeted by this assay, and inadequate number of viral copies  (<250 copies / mL). A negative result must be combined with clinical  observations, patient history, and epidemiological information. If result is POSITIVE SARS-CoV-2 target nucleic acids are DETECTED. The SARS-CoV-2 RNA is generally detectable in upper and lower  respiratory specimens dur ing the acute phase of infection.  Positive  results are indicative of active infection with SARS-CoV-2.  Clinical  correlation with patient history and other diagnostic information is  necessary to determine patient infection status.  Positive results do  not rule out bacterial infection or co-infection with other viruses. If result is PRESUMPTIVE POSTIVE SARS-CoV-2 nucleic acids MAY BE PRESENT.   A presumptive positive result was obtained on the submitted specimen  and confirmed on repeat testing.  While 2019 novel coronavirus  (SARS-CoV-2) nucleic acids may be present  in the submitted sample  additional confirmatory testing may be necessary for epidemiological  and / or clinical management purposes  to differentiate between  SARS-CoV-2 and other Sarbecovirus currently known to infect humans.  If clinically indicated additional testing with an alternate test  methodology [phone]) is advised. The SARS-CoV-2 RNA is generally  detectable in upper and lower respiratory sp ecimens during the acute  phase of infection. The expected result is Negative. Fact Sheet for Patients:  StrictlyIdeas.no Fact Sheet for Healthcare Providers: BankingDealers.co.za This test is not yet approved or cleared by the [place] FDA and has been authorized for detection and/or diagnosis of SARS-CoV-2 by FDA under an Emergency Use Authorization (EUA).  This EUA will remain in effect (meaning this test can be used) for the duration of the COVID-19 declaration under Section 564(b)(1) of the Act, 21 U.S.C. section 360bbb-3(b)(1), unless the authorization is terminated or revoked sooner. Performed at [facility], [place] [address] [place], [place] [address]   C difficile quick scan w PCR reflex     Status: Abnormal   Collection Time: 11/22/18 12:41 AM   Specimen: Nasopharyngeal Swab; Stool  Result Value Ref Range Status   C Diff antigen POSITIVE (A) NEGATIVE Final   C Diff toxin POSITIVE (A) NEGATIVE Final   C Diff interpretation Toxin producing C. difficile detected.  Final    Comment: CRITICAL RESULT CALLED TO, READ BACK BY AND VERIFIED WITH: RAZILLE,C 0340 ON [id number] BY POTEAT,S Performed at [facility] [address], [place], [place] [address]   MRSA PCR Screening     Status: Abnormal   Collection Time: 11/23/18  5:03 AM   Specimen: Nasal Mucosa; Nasopharyngeal  Result Value Ref Range Status   MRSA by PCR POSITIVE (A) NEGATIVE Final    Comment:        The GeneXpert MRSA Assay (FDA  approved for NASAL specimens only), is one component of a comprehensive MRSA colonization surveillance program. It is not intended to diagnose MRSA infection nor to guide or monitor treatment for MRSA infections. RESULT CALLED TO, READ BACK BY AND VERIFIED WITH: LETCHFORD, H. RN @0751  ON 8.21.2020 BY [facility] Performed at [place] [address], [place],  [address]      Studies: No results found.  Scheduled Meds: . scopolamine  1 patch Transdermal Q72H  . sodium chloride flush  3 mL Intravenous Q12H    Continuous Infusions: . sodium chloride       Time spent: 25mins, case  discussed with hospice liaison and RN I have personally reviewed and interpreted on  11/25/2018 daily labs, imagings as discussed above under date review session and assessment and plans.  I reviewed all nursing notes, pharmacy notes,   vitals, pertinent old records  I have discussed plan of care as described above with RN , patient and family on 11/25/2018   Florencia Reasons MD, PhD, FACP  [facility] Pager [phone]. If 7PM-7AM, please contact night-coverage at [email or web address], password [facility] 11/25/2018, 10:33 AM  LOS: 3 days

## 2018-11-25 NOTE — Progress Notes (Signed)
[facility] [id number]  [facility] ([facility]) GIP RN Note @ 400pm  This is a related and covered GIP admission of 11/22/2018 with an [facility] diagnosis of Parkinson's Disease per Dr. Konrad Dolores .Patient was transported to ED for evaluation ofaltered mental status, increased work of breathing, diarrhea and fever after sustaining a fall at his facility.He is admitted to the hospital with diarrhea and septic shock.  V/S:  99.2 ax, 61/39, HR 110, RR 18, SPO2 85% RA Lab work:  None, pt comfort care and transitioning to end of life Diagnostics:  None IVs/PRNs:  Dilaudid 0.5 mg IV Q3H, received five times since last night.  Plan was to move [name] to [facility].  He is now unresponsive.  Spoke with daughter who was concerned that he was actively dying and she did not want to disrupt his comfort.  Attending MD agreed that respiratory status had changed and he was unresponsive.  Decision was made to not transfer to [facility].  IDT:  Updated Family:  Updated, at bedside with pt GOC:  Clear D/C Planning:  Appears a hospital death  Thank you, Venia Carbon RN, BSN, [facility] Liaison (in [place]) [phone]

## 2018-11-27 LAB — CULTURE, BLOOD (ROUTINE X 2)
Culture: NO GROWTH
Culture: NO GROWTH
Special Requests: ADEQUATE
Special Requests: ADEQUATE

## 2018-12-04 NOTE — Progress Notes (Signed)
Family called RN to patient's room, patient had no pulse and wasn't breathing. Second RN, Bishnu Paudel checked and listened to patient. PA Schorr was notified. Post mortem care was performed. [place] donors was notified.

## 2018-12-04 NOTE — Death Summary Note (Signed)
Expiration Note/ Death Summary  Jason [name]  MR#: [id number]  DOB:September 15, 1927  Date of Admission: Dec 10, 2018 Date of Death: Dec 16, 2018  Attending [place]  Patient's PCP: [name], MD  Consults:  [place]    Cause of Death: Clostridium difficile colitis   Secondary Diagnoses Severe septic shock Hypovolemic shock  AKI Acute metabolic encephalopathy Recurrent cdiff colitis    Paroxysmal atrial fibrillation  Chronic diastolic CHF  H/o CVA with mild right side weakness H/o parkinson's disease with gait abnormality     History of bladder cancer History of non-Hodgkin lymphoma   Stage II pressure ulcer , back, present on admission      Brief H and P: For complete details please refer to admission H and P, but in brief Jason [name] was an 85 y.o.malewith medical history significant forC. difficile colitis no longer on antibiotics for this due to hospice status, bladder cancer, chronic diastolic CHF, basal cell skin cancer, non-Hodgkin lymphoma, paroxysmal A. fib, peptic ulcer disease, previous stroke who presented to [facility] ED from ALF due to worsening diarrhea and altered mental status.    In the ER, he had severe hypotension and A. fib with RVR despite IV fluid boluses. C. difficile PCR positive, and he was started on IV Flagyl in the ED. Per documentation, patient refused oral vancomycin.        Hospital Course: The patient had recurrent C. difficile, was failing treatment.  On the day of admission, staff at [facility] noticed increased diarrhea and worsening lethargy and apparent pain not responding to oral medications, and called EMS for relief.  In the ER, the patient was hypotensive, and had A. fib with RVR.  After discussion of goals of care with family, and-urged to "something", he was given IV fluids and IV Flagyl (he was unable to take p.o. at that time).  The patient's clinical status  gradually worsened, and after 48 hours, he was transitioned to full comfort care.  He was given IV opiates for relief of pain and dyspnea, and his symptoms improved.  He passed away comfortably on 12/15/2022.       Signed:  Edwin Dada M.D. [facility] 2018/12/16, 3:59 PM

## 2018-12-04 NOTE — Progress Notes (Signed)
PROGRESS NOTE    Jason [name]  [id number] DOB: [birth year] DOA: 11/08/2018 PCP: [name], MD      Brief Narrative:  [name] is a 85 y.o. M with hx Cdiff, bladder CA, dCHF, NHL and pAF who presented from [facility] due to worsening diarrhea and decreased mentation.  In the ER, BP was low, and patient started on IV fluids and Flagyl.    Assessment & Plan:  Cdiff colitis Per goals of care discussions with Dr. Erlinda Hong, family have elected for full comfort cares. -Patient comfortable -q2 turns -Oral care -IV dilaudid PRN for pain      MDM and disposition: The below labs and imaging reports were reviewed and summarized above.  Medication management as above.  The patient was admitted with worsening now terminal Cdiff colitis.  He is now comfort cares.  We will continue full comfort measures..         DVT prophylaxis: N/A  Code Status: DNR Family Communication: Daughters at bedside   Subjective: Patient somnolent.  More calm today.  No crying out in pain.  Slightly tachypneic.    Objective: Vitals:   2018/12/12 0659 12/12/18 1000 Dec 12, 2018 1320 2018-12-12 1900  BP: (!) 76/36   (!) 68/43  Pulse: 83   80  Resp: (!) 27 (!) 28 (!) 28   Temp: 98.8 F (37.1 C)   99.3 F (37.4 C)  TempSrc: Oral   Axillary  SpO2: 92%     Weight:      Height:        Intake/Output Summary (Last 24 hours) at Dec 12, 2018 2022 Last data filed at 12-Dec-2018 1900 Gross per 24 hour  Intake 80 ml  Output -  Net 80 ml   Filed Weights   11/22/18 1700  Weight: 77.9 kg    Examination: General appearance:  adult male, sleeping  Cardiac: tachycardic, regular, nl S1-S2, no murmurs appreciated.  Capillary refill is brisk.  JVP not visible.  No LE edema.    Respiratory: Tachypneic, no rales or wheezes. Abdomen: Abdomen soft.  No grimace to palpation. No ascites, distension, hepatosplenomegaly.   MSK: No deformities or effusions. Neuro: Does not respond to verbal stimuli, does not  follow commands    Data Reviewed: I have personally reviewed following labs and imaging studies:  CBC: Recent Labs  Lab 11/16/2018 2225  WBC 8.5  NEUTROABS 6.8  HGB 14.8  HCT 47.6  MCV 89.6  PLT 123456   Basic Metabolic Panel: Recent Labs  Lab 11/20/2018 2225  NA 141  K 3.6  CL 109  CO2 18*  GLUCOSE 105*  BUN 44*  CREATININE 2.91*  CALCIUM 8.4*   GFR: Estimated Creatinine Clearance: 18.7 mL/min (A) (by C-G formula based on SCr of 2.91 mg/dL (H)). Liver Function Tests: Recent Labs  Lab 11/12/2018 2225  AST 27  ALT 7  ALKPHOS 47  BILITOT 0.5  PROT 5.8*  ALBUMIN 1.9*   No results for input(s): LIPASE, AMYLASE in the last 168 hours. No results for input(s): AMMONIA in the last 168 hours. Coagulation Profile: No results for input(s): INR, PROTIME in the last 168 hours. Cardiac Enzymes: No results for input(s): CKTOTAL, CKMB, CKMBINDEX, TROPONINI in the last 168 hours. BNP (last 3 results) No results for input(s): PROBNP in the last 8760 hours. HbA1C: No results for input(s): HGBA1C in the last 72 hours. CBG: No results for input(s): GLUCAP in the last 168 hours. Lipid Profile: No results for input(s): CHOL, HDL, LDLCALC, TRIG, CHOLHDL, LDLDIRECT  in the last 72 hours. Thyroid Function Tests: No results for input(s): TSH, T4TOTAL, FREET4, T3FREE, THYROIDAB in the last 72 hours. Anemia Panel: No results for input(s): VITAMINB12, FOLATE, FERRITIN, TIBC, IRON, RETICCTPCT in the last 72 hours. Urine analysis:    Component Value Date/Time   COLORURINE AMBER (A) 10/26/2018 1633   APPEARANCEUR HAZY (A) 10/26/2018 1633   LABSPEC 1.021 10/26/2018 1633   PHURINE 5.0 10/26/2018 1633   GLUCOSEU NEGATIVE 10/26/2018 1633   HGBUR NEGATIVE 10/26/2018 1633   BILIRUBINUR NEGATIVE 10/26/2018 1633   KETONESUR 5 (A) 10/26/2018 1633   PROTEINUR NEGATIVE 10/26/2018 1633   UROBILINOGEN 1.0 07/07/2011 1942   NITRITE NEGATIVE 10/26/2018 1633   LEUKOCYTESUR NEGATIVE 10/26/2018 1633    Sepsis Labs: @LABRCNTIP (procalcitonin:4,lacticacidven:4)  ) Recent Results (from the past 240 hour(s))  Blood culture (routine x 2)     Status: None (Preliminary result)   Collection Time: 11/22/18 12:24 AM   Specimen: BLOOD  Result Value Ref Range Status   Specimen Description   Final    BLOOD RIGHT ANTECUBITAL Performed at [facility], [place] [address] [place], [place] [address]    Special Requests   Final    BOTTLES DRAWN AEROBIC AND ANAEROBIC Blood Culture adequate volume Performed at [facility] [address] [place], [place] [address]    Culture   Final    NO GROWTH 4 DAYS Performed at [facility], [place] [address], [place], [place] [address]    Report Status PENDING  Incomplete  Blood culture (routine x 2)     Status: None (Preliminary result)   Collection Time: 11/22/18 12:29 AM   Specimen: BLOOD LEFT FOREARM  Result Value Ref Range Status   Specimen Description   Final    BLOOD LEFT FOREARM Performed at [facility], [place] [address], [place], [place] [address]    Special Requests   Final    BOTTLES DRAWN AEROBIC AND ANAEROBIC Blood Culture adequate volume Performed at [facility] [address] [place],  [address]    Culture   Final    NO GROWTH 4 DAYS Performed at [facility], [place] [address], [place],  [address]    Report Status PENDING  Incomplete  SARS Coronavirus 2 [facility] order, Performed in [facility] hospital lab) Nasopharyngeal Nasopharyngeal Swab     Status: None   Collection Time: 11/22/18 12:40 AM   Specimen: Nasopharyngeal Swab  Result Value Ref Range Status   SARS Coronavirus 2 NEGATIVE NEGATIVE Final    Comment: (NOTE) If result is NEGATIVE SARS-CoV-2 target nucleic acids are NOT DETECTED. The SARS-CoV-2 RNA is generally detectable in upper and lower  respiratory specimens during the acute phase of infection. The lowest  concentration of SARS-CoV-2  viral copies this assay can detect is 250  copies / mL. A negative result does not preclude SARS-CoV-2 infection  and should not be used as the sole basis for treatment or other  patient management decisions.  A negative result may occur with  improper specimen collection / handling, submission of specimen other  than nasopharyngeal swab, presence of viral mutation(s) within the  areas targeted by this assay, and inadequate number of viral copies  (<250 copies / mL). A negative result must be combined with clinical  observations, patient history, and epidemiological information. If result is POSITIVE SARS-CoV-2 target nucleic acids are DETECTED. The SARS-CoV-2 RNA is generally detectable in upper and lower  respiratory specimens dur ing  the acute phase of infection.  Positive  results are indicative of active infection with SARS-CoV-2.  Clinical  correlation with patient history and other diagnostic information is  necessary to determine patient infection status.  Positive results do  not rule out bacterial infection or co-infection with other viruses. If result is PRESUMPTIVE POSTIVE SARS-CoV-2 nucleic acids MAY BE PRESENT.   A presumptive positive result was obtained on the submitted specimen  and confirmed on repeat testing.  While 2019 novel coronavirus  (SARS-CoV-2) nucleic acids may be present in the submitted sample  additional confirmatory testing may be necessary for epidemiological  and / or clinical management purposes  to differentiate between  SARS-CoV-2 and other Sarbecovirus currently known to infect humans.  If clinically indicated additional testing with an alternate test  methodology [phone]) is advised. The SARS-CoV-2 RNA is generally  detectable in upper and lower respiratory sp ecimens during the acute  phase of infection. The expected result is Negative. Fact Sheet for Patients:  StrictlyIdeas.no Fact Sheet for Healthcare Providers:  BankingDealers.co.za This test is not yet approved or cleared by the [place] FDA and has been authorized for detection and/or diagnosis of SARS-CoV-2 by FDA under an Emergency Use Authorization (EUA).  This EUA will remain in effect (meaning this test can be used) for the duration of the COVID-19 declaration under Section 564(b)(1) of the Act, 21 U.S.C. section 360bbb-3(b)(1), unless the authorization is terminated or revoked sooner. Performed at [facility], [place] [address] [place], [place] [address]   C difficile quick scan w PCR reflex     Status: Abnormal   Collection Time: 11/22/18 12:41 AM   Specimen: Nasopharyngeal Swab; Stool  Result Value Ref Range Status   C Diff antigen POSITIVE (A) NEGATIVE Final   C Diff toxin POSITIVE (A) NEGATIVE Final   C Diff interpretation Toxin producing C. difficile detected.  Final    Comment: CRITICAL RESULT CALLED TO, READ BACK BY AND VERIFIED WITH: RAZILLE,C 0340 ON CJ:[id number] BY POTEAT,S Performed at [facility] [address] [place], [place] [address]   MRSA PCR Screening     Status: Abnormal   Collection Time: 11/23/18  5:03 AM   Specimen: Nasal Mucosa; Nasopharyngeal  Result Value Ref Range Status   MRSA by PCR POSITIVE (A) NEGATIVE Final    Comment:        The GeneXpert MRSA Assay (FDA approved for NASAL specimens only), is one component of a comprehensive MRSA colonization surveillance program. It is not intended to diagnose MRSA infection nor to guide or monitor treatment for MRSA infections. RESULT CALLED TO, READ BACK BY AND VERIFIED WITH: LETCHFORD, H. RN @0751  ON 8.21.2020 BY [facility] Performed at [facility] [address], [place], [place] [address]          Radiology Studies: No results found.      Scheduled Meds: . scopolamine  1 patch Transdermal Q72H  . sodium chloride flush  3 mL Intravenous Q12H   Continuous  Infusions: . sodium chloride       LOS: 4 days    Time spent: 25 minutes was spent with the patient and daughters, face to face or on the floor, with greater than 50% of total time spent examining patient, counseling patient and/or family regarding end of life care and C diff, and coordinating care.     Edwin Dada, MD [facility] 2018-12-07, 8:22 PM     Please page through AMION:  [email or web address]  Password [email or web address] If 7PM-7AM, please contact night-coverage

## 2018-12-04 NOTE — Progress Notes (Addendum)
[facility] ([facility])GIP RN Note.  This is a related and covered GIP admission of 08/20/2020withan [facility] diagnosis of Parkinson's Diseaseper Dr. Konrad Dolores.Patientwas transported to ED for evaluation ofaltered mental status, increased work of breathing, diarrhea and feverafter sustaining a fall at his facility.He is admitted to the hospital with diarrhea and septic shock.  Spoke with daughter who states patient appears comfortable. Non reponsive.  Daughter is supportive of keeping patient in hospital as opposed to attempting to move him to [facility].   VS: 98.8, 76/36, 49, 27, 92% RA I/O: 80/80 No Lab/diagnostices Medications: Dilaudid 0.5mg  IV @ [phone]  Plan was to move Mr. Becherer to [facility].  He is now unresponsive.    Decision was made to not transfer to [facility].  IDT:  Updated Family:  Updated, at bedside with pt GOC:  Clear D/C Planning:  Appears a hospital death  Thank you, Farrel Gordon, RN, [place] (in [place]) [phone]

## 2018-12-04 DEATH — deceased
